# Patient Record
Sex: Male | Born: 1935 | Race: White | Hispanic: No | Marital: Married | Smoking: Never smoker
Health system: Southern US, Community
[De-identification: ages and names within clinical notes are randomized; demographics above are authoritative.]

## PROBLEM LIST (undated history)

## (undated) DIAGNOSIS — I4891 Unspecified atrial fibrillation: Secondary | ICD-10-CM

## (undated) DIAGNOSIS — K5790 Diverticulosis of intestine, part unspecified, without perforation or abscess without bleeding: Secondary | ICD-10-CM

## (undated) DIAGNOSIS — D510 Vitamin B12 deficiency anemia due to intrinsic factor deficiency: Secondary | ICD-10-CM

## (undated) DIAGNOSIS — C61 Malignant neoplasm of prostate: Secondary | ICD-10-CM

## (undated) DIAGNOSIS — Z860100 Personal history of colon polyps, unspecified: Secondary | ICD-10-CM

## (undated) DIAGNOSIS — E538 Deficiency of other specified B group vitamins: Secondary | ICD-10-CM

## (undated) DIAGNOSIS — E785 Hyperlipidemia, unspecified: Secondary | ICD-10-CM

## (undated) DIAGNOSIS — I10 Essential (primary) hypertension: Secondary | ICD-10-CM

## (undated) DIAGNOSIS — R5383 Other fatigue: Secondary | ICD-10-CM

## (undated) DIAGNOSIS — I739 Peripheral vascular disease, unspecified: Secondary | ICD-10-CM

## (undated) DIAGNOSIS — R55 Syncope and collapse: Secondary | ICD-10-CM

## (undated) HISTORY — DX: Hyperlipidemia, unspecified: E78.5

## (undated) HISTORY — DX: Essential (primary) hypertension: I10

## (undated) HISTORY — DX: Malignant neoplasm of prostate: C61

## (undated) HISTORY — DX: Unspecified atrial fibrillation: I48.91

## (undated) HISTORY — DX: Vitamin B12 deficiency anemia due to intrinsic factor deficiency: D51.0

## (undated) HISTORY — DX: Diverticulosis of intestine, part unspecified, without perforation or abscess without bleeding: K57.90

## (undated) HISTORY — DX: Syncope and collapse: R55

## (undated) HISTORY — DX: Personal history of colon polyps, unspecified: Z86.0100

## (undated) HISTORY — PX: TONSILLECTOMY: SUR1361

## (undated) HISTORY — DX: Peripheral vascular disease, unspecified: I73.9

## (undated) HISTORY — DX: Other fatigue: R53.83

## (undated) HISTORY — PX: APPENDECTOMY: SHX54

## (undated) HISTORY — DX: Personal history of colonic polyps: Z86.010

## (undated) HISTORY — DX: Deficiency of other specified B group vitamins: E53.8

---

## 2002-01-30 ENCOUNTER — Ambulatory Visit (HOSPITAL_COMMUNITY): Admission: RE | Admit: 2002-01-30 | Discharge: 2002-01-30 | Payer: Self-pay | Admitting: Gastroenterology

## 2002-01-30 ENCOUNTER — Encounter (INDEPENDENT_AMBULATORY_CARE_PROVIDER_SITE_OTHER): Payer: Self-pay

## 2007-08-24 ENCOUNTER — Encounter: Admission: RE | Admit: 2007-08-24 | Discharge: 2007-08-24 | Payer: Self-pay | Admitting: Internal Medicine

## 2008-10-19 ENCOUNTER — Ambulatory Visit: Payer: Self-pay | Admitting: Family Medicine

## 2008-10-19 DIAGNOSIS — M20019 Mallet finger of unspecified finger(s): Secondary | ICD-10-CM | POA: Insufficient documentation

## 2008-11-02 ENCOUNTER — Encounter: Payer: Self-pay | Admitting: Family Medicine

## 2010-04-17 NOTE — Assessment & Plan Note (Signed)
Summary: np w/poss mallet finger/   Vital Signs:  Patient profile:   76 year old male Height:      72 inches Weight:      215 pounds BMI:     29.26 BP sitting:   128 / 70  Vitals Entered By: Lillia Pauls CMA (October 19, 2008 8:48 AM)  History of Present Illness: [name] is seen today because of an injury to his right fourth finger.  He was working on the floor with a book on top of a desk.  This past Tuesday, his fourth finger was struck as he tried to catch a large book as it fell from the desk top.  He does not have any pain at rest. Some small amount swelling.  Cannot fully extend finger, it droopes.  PERTINENT PMH:  No prior injury or surgery to that finger.  MEDS Lovastatin SH Non smoker Not on coumadin  Physical Exam  Additional Exam:  Right hand 4th finger lacks extension at DIP. he has full extension at PIP. Normal strength in rest of hand. Small amount of slight bruising over DIP joint. minimally tender  Nail and nail bed intact.   Impression & Recommendations:  Problem # 1:  MALLET FINGER, RIGHT RING FINGER (ICD-736.1) stack splint we have in stock does not totally fit so I have given him rx for [facility] for custom molded splint. Mqallet protocol  We also gave  him the splint taht fit him best as he says he cannot go right there . discussed treatment plan in detail  He can f/u w [place] in 6 weeks or w [facility]  Appended Document: np w/poss mallet finger/    Clinical Lists Changes  Orders: Added new Service order of New Patient Level III [phone]) - Signed

## 2010-04-17 NOTE — Consult Note (Signed)
Summary: [facility]  [facility]   Imported By: Clydell Hakim 11/28/2008 14:44:39  _____________________________________________________________________  External Attachment:    Type:   Image     Comment:   External Document

## 2010-05-27 ENCOUNTER — Encounter: Payer: Self-pay | Admitting: Home Health Services

## 2010-08-01 NOTE — Op Note (Signed)
   NAME:  Logan French                      ACCOUNT NO.:  [id number]   MEDICAL RECORD NO.:  [id number]                   PATIENT TYPE:  AMB   LOCATION:  ENDO                                 FACILITY:  [facility]   PHYSICIAN:  John C. Madilyn Fireman, M.D.                 DATE OF BIRTH:  Mar 23, 1934   DATE OF PROCEDURE:  01/30/2002  DATE OF DISCHARGE:                                 OPERATIVE REPORT   PROCEDURE:  Colonoscopy with polypectomy.   INDICATIONS FOR PROCEDURE:  Screening colonoscopy in a 76 year old patient  with no prior colon screening.   DESCRIPTION OF PROCEDURE:  The patient was placed in the left lateral  decubitus position and placed on the pulse monitor with continuous low-flow  oxygen delivered by nasal cannula.  He was sedated with 50 mg IV Demerol and  6 mg IV Versed.  The Olympus video colonoscope was inserted into the rectum  and advanced to the cecum, confirmed by transillumination at McBurney's  point and visualization of the ileocecal valve and appendiceal orifice.  The  prep was excellent.  The cecum appeared normal with no masses, polyps,  diverticula, or other mucosal abnormalities.  Within the ascending colon  there was an 8 mm polyp which was fulgurated by hot biopsy.  A second 8 mm  polyp was seen in the transverse colon and also fulgurated by hot biopsy.  A  1 cm polyp was removed in the descending colon at 50 cm and was removed by  snare.  There was scattered sigmoid and descending colon diverticula.  The  rectum appeared normal, and retroflexed view of the anus revealed no obvious  internal hemorrhoids.  The colonoscope was then withdrawn, and the patient  returned to the recovery room in stable condition.  He tolerated the  procedure well, and there were no immediate complications.   IMPRESSION:  Ascending, transverse, and descending colon polyps.   PLAN:  Await histology to determine method and interval for future colon  screening.                              John C. Madilyn Fireman, M.D.    J[facility]/MEDQ  D:  01/30/2002  T:  01/30/2002  Job:  [id number]   cc:   Barry Dienes. Eloise Harman, M.D.

## 2011-10-01 DIAGNOSIS — C61 Malignant neoplasm of prostate: Secondary | ICD-10-CM | POA: Diagnosis not present

## 2011-10-08 DIAGNOSIS — C61 Malignant neoplasm of prostate: Secondary | ICD-10-CM | POA: Diagnosis not present

## 2011-10-29 DIAGNOSIS — H5210 Myopia, unspecified eye: Secondary | ICD-10-CM | POA: Diagnosis not present

## 2011-10-29 DIAGNOSIS — H259 Unspecified age-related cataract: Secondary | ICD-10-CM | POA: Diagnosis not present

## 2011-10-29 DIAGNOSIS — H43819 Vitreous degeneration, unspecified eye: Secondary | ICD-10-CM | POA: Diagnosis not present

## 2011-10-29 DIAGNOSIS — H52209 Unspecified astigmatism, unspecified eye: Secondary | ICD-10-CM | POA: Diagnosis not present

## 2011-12-08 DIAGNOSIS — H52209 Unspecified astigmatism, unspecified eye: Secondary | ICD-10-CM | POA: Diagnosis not present

## 2011-12-08 DIAGNOSIS — H269 Unspecified cataract: Secondary | ICD-10-CM | POA: Diagnosis not present

## 2011-12-08 DIAGNOSIS — H5015 Alternating exotropia: Secondary | ICD-10-CM | POA: Diagnosis not present

## 2011-12-08 DIAGNOSIS — H5210 Myopia, unspecified eye: Secondary | ICD-10-CM | POA: Diagnosis not present

## 2011-12-08 DIAGNOSIS — H25049 Posterior subcapsular polar age-related cataract, unspecified eye: Secondary | ICD-10-CM | POA: Diagnosis not present

## 2011-12-08 DIAGNOSIS — H25019 Cortical age-related cataract, unspecified eye: Secondary | ICD-10-CM | POA: Diagnosis not present

## 2011-12-08 DIAGNOSIS — H2510 Age-related nuclear cataract, unspecified eye: Secondary | ICD-10-CM | POA: Diagnosis not present

## 2012-04-25 DIAGNOSIS — H52209 Unspecified astigmatism, unspecified eye: Secondary | ICD-10-CM | POA: Diagnosis not present

## 2012-04-25 DIAGNOSIS — H259 Unspecified age-related cataract: Secondary | ICD-10-CM | POA: Diagnosis not present

## 2012-04-25 DIAGNOSIS — Z961 Presence of intraocular lens: Secondary | ICD-10-CM | POA: Diagnosis not present

## 2012-04-25 DIAGNOSIS — H5010 Unspecified exotropia: Secondary | ICD-10-CM | POA: Diagnosis not present

## 2012-05-24 DIAGNOSIS — H5210 Myopia, unspecified eye: Secondary | ICD-10-CM | POA: Diagnosis not present

## 2012-05-24 DIAGNOSIS — H5015 Alternating exotropia: Secondary | ICD-10-CM | POA: Diagnosis not present

## 2012-05-24 DIAGNOSIS — H2510 Age-related nuclear cataract, unspecified eye: Secondary | ICD-10-CM | POA: Diagnosis not present

## 2012-05-24 DIAGNOSIS — H25049 Posterior subcapsular polar age-related cataract, unspecified eye: Secondary | ICD-10-CM | POA: Diagnosis not present

## 2012-05-24 DIAGNOSIS — H25039 Anterior subcapsular polar age-related cataract, unspecified eye: Secondary | ICD-10-CM | POA: Diagnosis not present

## 2012-05-24 DIAGNOSIS — H269 Unspecified cataract: Secondary | ICD-10-CM | POA: Diagnosis not present

## 2012-05-24 DIAGNOSIS — H52209 Unspecified astigmatism, unspecified eye: Secondary | ICD-10-CM | POA: Diagnosis not present

## 2012-07-20 DIAGNOSIS — D510 Vitamin B12 deficiency anemia due to intrinsic factor deficiency: Secondary | ICD-10-CM | POA: Diagnosis not present

## 2012-07-20 DIAGNOSIS — I10 Essential (primary) hypertension: Secondary | ICD-10-CM | POA: Diagnosis not present

## 2012-07-20 DIAGNOSIS — E785 Hyperlipidemia, unspecified: Secondary | ICD-10-CM | POA: Diagnosis not present

## 2012-07-20 DIAGNOSIS — Z125 Encounter for screening for malignant neoplasm of prostate: Secondary | ICD-10-CM | POA: Diagnosis not present

## 2012-07-27 DIAGNOSIS — Z0000 Encounter for general adult medical examination without abnormal findings: Secondary | ICD-10-CM | POA: Diagnosis not present

## 2012-07-27 DIAGNOSIS — E785 Hyperlipidemia, unspecified: Secondary | ICD-10-CM | POA: Diagnosis not present

## 2012-07-27 DIAGNOSIS — Z6830 Body mass index (BMI) 30.0-30.9, adult: Secondary | ICD-10-CM | POA: Diagnosis not present

## 2012-07-27 DIAGNOSIS — D510 Vitamin B12 deficiency anemia due to intrinsic factor deficiency: Secondary | ICD-10-CM | POA: Diagnosis not present

## 2012-07-27 DIAGNOSIS — Z23 Encounter for immunization: Secondary | ICD-10-CM | POA: Diagnosis not present

## 2012-07-27 DIAGNOSIS — C61 Malignant neoplasm of prostate: Secondary | ICD-10-CM | POA: Diagnosis not present

## 2012-07-27 DIAGNOSIS — H6120 Impacted cerumen, unspecified ear: Secondary | ICD-10-CM | POA: Diagnosis not present

## 2012-07-27 DIAGNOSIS — I10 Essential (primary) hypertension: Secondary | ICD-10-CM | POA: Diagnosis not present

## 2012-08-03 DIAGNOSIS — C61 Malignant neoplasm of prostate: Secondary | ICD-10-CM | POA: Diagnosis not present

## 2012-08-15 DIAGNOSIS — N320 Bladder-neck obstruction: Secondary | ICD-10-CM | POA: Diagnosis not present

## 2012-08-15 DIAGNOSIS — C61 Malignant neoplasm of prostate: Secondary | ICD-10-CM | POA: Diagnosis not present

## 2012-09-15 DIAGNOSIS — Z6830 Body mass index (BMI) 30.0-30.9, adult: Secondary | ICD-10-CM | POA: Diagnosis not present

## 2012-09-15 DIAGNOSIS — M7660 Achilles tendinitis, unspecified leg: Secondary | ICD-10-CM | POA: Diagnosis not present

## 2012-10-24 DIAGNOSIS — M7660 Achilles tendinitis, unspecified leg: Secondary | ICD-10-CM | POA: Diagnosis not present

## 2012-10-24 DIAGNOSIS — L84 Corns and callosities: Secondary | ICD-10-CM | POA: Diagnosis not present

## 2012-10-24 DIAGNOSIS — M25569 Pain in unspecified knee: Secondary | ICD-10-CM | POA: Diagnosis not present

## 2012-10-24 DIAGNOSIS — Z6830 Body mass index (BMI) 30.0-30.9, adult: Secondary | ICD-10-CM | POA: Diagnosis not present

## 2012-11-01 DIAGNOSIS — M7660 Achilles tendinitis, unspecified leg: Secondary | ICD-10-CM | POA: Diagnosis not present

## 2012-11-01 DIAGNOSIS — Q828 Other specified congenital malformations of skin: Secondary | ICD-10-CM | POA: Diagnosis not present

## 2012-11-23 DIAGNOSIS — M1710 Unilateral primary osteoarthritis, unspecified knee: Secondary | ICD-10-CM | POA: Diagnosis not present

## 2012-11-23 DIAGNOSIS — IMO0002 Reserved for concepts with insufficient information to code with codable children: Secondary | ICD-10-CM | POA: Diagnosis not present

## 2012-11-23 DIAGNOSIS — M6520 Calcific tendinitis, unspecified site: Secondary | ICD-10-CM | POA: Diagnosis not present

## 2013-01-12 DIAGNOSIS — H5010 Unspecified exotropia: Secondary | ICD-10-CM | POA: Diagnosis not present

## 2013-01-12 DIAGNOSIS — H5210 Myopia, unspecified eye: Secondary | ICD-10-CM | POA: Diagnosis not present

## 2013-01-12 DIAGNOSIS — Z961 Presence of intraocular lens: Secondary | ICD-10-CM | POA: Diagnosis not present

## 2013-01-12 DIAGNOSIS — H52209 Unspecified astigmatism, unspecified eye: Secondary | ICD-10-CM | POA: Diagnosis not present

## 2013-02-17 DIAGNOSIS — C61 Malignant neoplasm of prostate: Secondary | ICD-10-CM | POA: Diagnosis not present

## 2013-02-24 DIAGNOSIS — C61 Malignant neoplasm of prostate: Secondary | ICD-10-CM | POA: Diagnosis not present

## 2013-02-24 DIAGNOSIS — N529 Male erectile dysfunction, unspecified: Secondary | ICD-10-CM | POA: Diagnosis not present

## 2013-08-03 DIAGNOSIS — D510 Vitamin B12 deficiency anemia due to intrinsic factor deficiency: Secondary | ICD-10-CM | POA: Diagnosis not present

## 2013-08-03 DIAGNOSIS — E785 Hyperlipidemia, unspecified: Secondary | ICD-10-CM | POA: Diagnosis not present

## 2013-08-03 DIAGNOSIS — R7309 Other abnormal glucose: Secondary | ICD-10-CM | POA: Diagnosis not present

## 2013-08-03 DIAGNOSIS — I10 Essential (primary) hypertension: Secondary | ICD-10-CM | POA: Diagnosis not present

## 2013-08-10 DIAGNOSIS — E785 Hyperlipidemia, unspecified: Secondary | ICD-10-CM | POA: Diagnosis not present

## 2013-08-10 DIAGNOSIS — H6120 Impacted cerumen, unspecified ear: Secondary | ICD-10-CM | POA: Diagnosis not present

## 2013-08-10 DIAGNOSIS — D510 Vitamin B12 deficiency anemia due to intrinsic factor deficiency: Secondary | ICD-10-CM | POA: Diagnosis not present

## 2013-08-10 DIAGNOSIS — Z79899 Other long term (current) drug therapy: Secondary | ICD-10-CM | POA: Diagnosis not present

## 2013-08-10 DIAGNOSIS — Z1331 Encounter for screening for depression: Secondary | ICD-10-CM | POA: Diagnosis not present

## 2013-08-10 DIAGNOSIS — C61 Malignant neoplasm of prostate: Secondary | ICD-10-CM | POA: Diagnosis not present

## 2013-08-10 DIAGNOSIS — R351 Nocturia: Secondary | ICD-10-CM | POA: Diagnosis not present

## 2013-08-10 DIAGNOSIS — R7309 Other abnormal glucose: Secondary | ICD-10-CM | POA: Diagnosis not present

## 2013-08-10 DIAGNOSIS — Z0000 Encounter for general adult medical examination without abnormal findings: Secondary | ICD-10-CM | POA: Diagnosis not present

## 2013-08-14 DIAGNOSIS — Z1212 Encounter for screening for malignant neoplasm of rectum: Secondary | ICD-10-CM | POA: Diagnosis not present

## 2013-08-18 DIAGNOSIS — C61 Malignant neoplasm of prostate: Secondary | ICD-10-CM | POA: Diagnosis not present

## 2013-08-25 DIAGNOSIS — R351 Nocturia: Secondary | ICD-10-CM | POA: Diagnosis not present

## 2013-08-25 DIAGNOSIS — N529 Male erectile dysfunction, unspecified: Secondary | ICD-10-CM | POA: Diagnosis not present

## 2013-08-25 DIAGNOSIS — C61 Malignant neoplasm of prostate: Secondary | ICD-10-CM | POA: Diagnosis not present

## 2013-08-30 ENCOUNTER — Other Ambulatory Visit: Payer: Self-pay | Admitting: Urology

## 2013-08-30 DIAGNOSIS — C61 Malignant neoplasm of prostate: Secondary | ICD-10-CM

## 2013-09-21 ENCOUNTER — Other Ambulatory Visit: Payer: Self-pay | Admitting: Urology

## 2013-09-21 ENCOUNTER — Other Ambulatory Visit (HOSPITAL_COMMUNITY): Payer: Medicare Other

## 2013-09-21 ENCOUNTER — Ambulatory Visit (HOSPITAL_COMMUNITY)
Admission: RE | Admit: 2013-09-21 | Discharge: 2013-09-21 | Disposition: A | Discharge status: Home / Self Care | Payer: Medicare Other | Source: Ambulatory Visit | Attending: Urology | Admitting: Urology

## 2013-09-21 DIAGNOSIS — C61 Malignant neoplasm of prostate: Secondary | ICD-10-CM

## 2013-09-21 DIAGNOSIS — R599 Enlarged lymph nodes, unspecified: Secondary | ICD-10-CM | POA: Insufficient documentation

## 2013-09-21 LAB — POCT I-STAT CREATININE: Creatinine, Ser: 1.2 mg/dL (ref 0.50–1.35)

## 2013-09-21 MED ORDER — GADOBENATE DIMEGLUMINE 529 MG/ML IV SOLN
20.0000 mL | Freq: Once | INTRAVENOUS | Status: AC | PRN
  Administered 2013-09-21: 20 mL via INTRAVENOUS

## 2013-11-18 DIAGNOSIS — S1093XA Contusion of unspecified part of neck, initial encounter: Secondary | ICD-10-CM | POA: Diagnosis not present

## 2013-11-18 DIAGNOSIS — S99919A Unspecified injury of unspecified ankle, initial encounter: Secondary | ICD-10-CM | POA: Diagnosis not present

## 2013-11-18 DIAGNOSIS — S79919A Unspecified injury of unspecified hip, initial encounter: Secondary | ICD-10-CM | POA: Diagnosis not present

## 2013-11-18 DIAGNOSIS — W1809XA Striking against other object with subsequent fall, initial encounter: Secondary | ICD-10-CM | POA: Diagnosis not present

## 2013-11-18 DIAGNOSIS — S8990XA Unspecified injury of unspecified lower leg, initial encounter: Secondary | ICD-10-CM | POA: Diagnosis not present

## 2013-11-18 DIAGNOSIS — S0003XA Contusion of scalp, initial encounter: Secondary | ICD-10-CM | POA: Diagnosis not present

## 2013-11-18 DIAGNOSIS — S7000XA Contusion of unspecified hip, initial encounter: Secondary | ICD-10-CM | POA: Diagnosis not present

## 2013-11-18 DIAGNOSIS — IMO0002 Reserved for concepts with insufficient information to code with codable children: Secondary | ICD-10-CM | POA: Diagnosis not present

## 2013-11-18 DIAGNOSIS — S8000XA Contusion of unspecified knee, initial encounter: Secondary | ICD-10-CM | POA: Diagnosis not present

## 2013-11-18 DIAGNOSIS — S0083XA Contusion of other part of head, initial encounter: Secondary | ICD-10-CM | POA: Diagnosis not present

## 2014-01-24 DIAGNOSIS — C61 Malignant neoplasm of prostate: Secondary | ICD-10-CM | POA: Diagnosis not present

## 2014-01-31 DIAGNOSIS — N5201 Erectile dysfunction due to arterial insufficiency: Secondary | ICD-10-CM | POA: Diagnosis not present

## 2014-01-31 DIAGNOSIS — C61 Malignant neoplasm of prostate: Secondary | ICD-10-CM | POA: Diagnosis not present

## 2014-03-02 DIAGNOSIS — H5010 Unspecified exotropia: Secondary | ICD-10-CM | POA: Diagnosis not present

## 2014-03-02 DIAGNOSIS — H5213 Myopia, bilateral: Secondary | ICD-10-CM | POA: Diagnosis not present

## 2014-03-02 DIAGNOSIS — H524 Presbyopia: Secondary | ICD-10-CM | POA: Diagnosis not present

## 2014-03-02 DIAGNOSIS — Z961 Presence of intraocular lens: Secondary | ICD-10-CM | POA: Diagnosis not present

## 2014-06-11 DIAGNOSIS — L0232 Furuncle of buttock: Secondary | ICD-10-CM | POA: Diagnosis not present

## 2014-06-11 DIAGNOSIS — L0231 Cutaneous abscess of buttock: Secondary | ICD-10-CM | POA: Diagnosis not present

## 2014-06-11 DIAGNOSIS — Z6831 Body mass index (BMI) 31.0-31.9, adult: Secondary | ICD-10-CM | POA: Diagnosis not present

## 2014-06-11 DIAGNOSIS — C61 Malignant neoplasm of prostate: Secondary | ICD-10-CM | POA: Diagnosis not present

## 2014-07-20 DIAGNOSIS — L720 Epidermal cyst: Secondary | ICD-10-CM | POA: Diagnosis not present

## 2014-07-20 DIAGNOSIS — L812 Freckles: Secondary | ICD-10-CM | POA: Diagnosis not present

## 2014-07-20 DIAGNOSIS — L738 Other specified follicular disorders: Secondary | ICD-10-CM | POA: Diagnosis not present

## 2014-07-20 DIAGNOSIS — D1801 Hemangioma of skin and subcutaneous tissue: Secondary | ICD-10-CM | POA: Diagnosis not present

## 2014-08-14 DIAGNOSIS — D510 Vitamin B12 deficiency anemia due to intrinsic factor deficiency: Secondary | ICD-10-CM | POA: Diagnosis not present

## 2014-08-14 DIAGNOSIS — Z125 Encounter for screening for malignant neoplasm of prostate: Secondary | ICD-10-CM | POA: Diagnosis not present

## 2014-08-14 DIAGNOSIS — E785 Hyperlipidemia, unspecified: Secondary | ICD-10-CM | POA: Diagnosis not present

## 2014-08-14 DIAGNOSIS — I10 Essential (primary) hypertension: Secondary | ICD-10-CM | POA: Diagnosis not present

## 2014-08-14 DIAGNOSIS — R7301 Impaired fasting glucose: Secondary | ICD-10-CM | POA: Diagnosis not present

## 2014-08-20 DIAGNOSIS — E785 Hyperlipidemia, unspecified: Secondary | ICD-10-CM | POA: Diagnosis not present

## 2014-08-20 DIAGNOSIS — H6121 Impacted cerumen, right ear: Secondary | ICD-10-CM | POA: Diagnosis not present

## 2014-08-20 DIAGNOSIS — Z0000 Encounter for general adult medical examination without abnormal findings: Secondary | ICD-10-CM | POA: Diagnosis not present

## 2014-08-20 DIAGNOSIS — E669 Obesity, unspecified: Secondary | ICD-10-CM | POA: Diagnosis not present

## 2014-08-20 DIAGNOSIS — I10 Essential (primary) hypertension: Secondary | ICD-10-CM | POA: Diagnosis not present

## 2014-08-20 DIAGNOSIS — Z6830 Body mass index (BMI) 30.0-30.9, adult: Secondary | ICD-10-CM | POA: Diagnosis not present

## 2014-08-20 DIAGNOSIS — R05 Cough: Secondary | ICD-10-CM | POA: Diagnosis not present

## 2014-08-20 DIAGNOSIS — R7301 Impaired fasting glucose: Secondary | ICD-10-CM | POA: Diagnosis not present

## 2014-08-20 DIAGNOSIS — Z1389 Encounter for screening for other disorder: Secondary | ICD-10-CM | POA: Diagnosis not present

## 2014-08-20 DIAGNOSIS — D510 Vitamin B12 deficiency anemia due to intrinsic factor deficiency: Secondary | ICD-10-CM | POA: Diagnosis not present

## 2014-08-20 DIAGNOSIS — C61 Malignant neoplasm of prostate: Secondary | ICD-10-CM | POA: Diagnosis not present

## 2014-08-23 DIAGNOSIS — Z1212 Encounter for screening for malignant neoplasm of rectum: Secondary | ICD-10-CM | POA: Diagnosis not present

## 2014-09-26 DIAGNOSIS — C61 Malignant neoplasm of prostate: Secondary | ICD-10-CM | POA: Diagnosis not present

## 2014-10-17 DIAGNOSIS — N5201 Erectile dysfunction due to arterial insufficiency: Secondary | ICD-10-CM | POA: Diagnosis not present

## 2014-10-17 DIAGNOSIS — N320 Bladder-neck obstruction: Secondary | ICD-10-CM | POA: Diagnosis not present

## 2014-10-17 DIAGNOSIS — C61 Malignant neoplasm of prostate: Secondary | ICD-10-CM | POA: Diagnosis not present

## 2014-10-24 DIAGNOSIS — S56911A Strain of unspecified muscles, fascia and tendons at forearm level, right arm, initial encounter: Secondary | ICD-10-CM | POA: Diagnosis not present

## 2014-10-24 DIAGNOSIS — M25521 Pain in right elbow: Secondary | ICD-10-CM | POA: Diagnosis not present

## 2014-12-18 DIAGNOSIS — Z23 Encounter for immunization: Secondary | ICD-10-CM | POA: Diagnosis not present

## 2015-03-04 DIAGNOSIS — H43813 Vitreous degeneration, bilateral: Secondary | ICD-10-CM | POA: Diagnosis not present

## 2015-03-04 DIAGNOSIS — Z961 Presence of intraocular lens: Secondary | ICD-10-CM | POA: Diagnosis not present

## 2015-03-04 DIAGNOSIS — H5010 Unspecified exotropia: Secondary | ICD-10-CM | POA: Diagnosis not present

## 2015-03-04 DIAGNOSIS — H5213 Myopia, bilateral: Secondary | ICD-10-CM | POA: Diagnosis not present

## 2015-04-24 DIAGNOSIS — L089 Local infection of the skin and subcutaneous tissue, unspecified: Secondary | ICD-10-CM | POA: Diagnosis not present

## 2015-04-24 DIAGNOSIS — L723 Sebaceous cyst: Secondary | ICD-10-CM | POA: Diagnosis not present

## 2015-08-20 DIAGNOSIS — E784 Other hyperlipidemia: Secondary | ICD-10-CM | POA: Diagnosis not present

## 2015-08-20 DIAGNOSIS — D510 Vitamin B12 deficiency anemia due to intrinsic factor deficiency: Secondary | ICD-10-CM | POA: Diagnosis not present

## 2015-08-20 DIAGNOSIS — I10 Essential (primary) hypertension: Secondary | ICD-10-CM | POA: Diagnosis not present

## 2015-08-20 DIAGNOSIS — R7301 Impaired fasting glucose: Secondary | ICD-10-CM | POA: Diagnosis not present

## 2015-08-26 DIAGNOSIS — R05 Cough: Secondary | ICD-10-CM | POA: Diagnosis not present

## 2015-08-26 DIAGNOSIS — E668 Other obesity: Secondary | ICD-10-CM | POA: Diagnosis not present

## 2015-08-26 DIAGNOSIS — C61 Malignant neoplasm of prostate: Secondary | ICD-10-CM | POA: Diagnosis not present

## 2015-08-26 DIAGNOSIS — D510 Vitamin B12 deficiency anemia due to intrinsic factor deficiency: Secondary | ICD-10-CM | POA: Diagnosis not present

## 2015-08-26 DIAGNOSIS — Z1389 Encounter for screening for other disorder: Secondary | ICD-10-CM | POA: Diagnosis not present

## 2015-08-26 DIAGNOSIS — Z6830 Body mass index (BMI) 30.0-30.9, adult: Secondary | ICD-10-CM | POA: Diagnosis not present

## 2015-08-26 DIAGNOSIS — R7301 Impaired fasting glucose: Secondary | ICD-10-CM | POA: Diagnosis not present

## 2015-08-26 DIAGNOSIS — Z0000 Encounter for general adult medical examination without abnormal findings: Secondary | ICD-10-CM | POA: Diagnosis not present

## 2015-08-26 DIAGNOSIS — I10 Essential (primary) hypertension: Secondary | ICD-10-CM | POA: Diagnosis not present

## 2015-08-26 DIAGNOSIS — E784 Other hyperlipidemia: Secondary | ICD-10-CM | POA: Diagnosis not present

## 2015-10-11 ENCOUNTER — Ambulatory Visit (INDEPENDENT_AMBULATORY_CARE_PROVIDER_SITE_OTHER): Payer: Medicare Other | Admitting: Podiatry

## 2015-10-11 ENCOUNTER — Encounter: Payer: Self-pay | Admitting: Podiatry

## 2015-10-11 VITALS — BP 134/78 | HR 70 | Resp 14

## 2015-10-11 DIAGNOSIS — M79676 Pain in unspecified toe(s): Secondary | ICD-10-CM

## 2015-10-11 DIAGNOSIS — B351 Tinea unguium: Secondary | ICD-10-CM | POA: Diagnosis not present

## 2015-10-11 DIAGNOSIS — Q828 Other specified congenital malformations of skin: Secondary | ICD-10-CM

## 2015-10-11 NOTE — Progress Notes (Signed)
   Subjective:    Patient ID: Logan French, male    DOB: 1935/02/13, 81 y.o.   MRN: [id number]  HPI this patient presents the office with chief complaint of long thick painful nails. The nails  are painful walking and wearing his shoes.  He also has painful calluses noted under the ball of both feet. He presents the office for preventative foot care services    Review of Systems  All other systems reviewed and are negative.      Objective:   Physical Exam GENERAL APPEARANCE: Alert, conversant. Appropriately groomed. No acute distress.  VASCULAR: Pedal pulses are  palpable at  [facility] and PT bilateral.  Capillary refill time is immediate to all digits,  Normal temperature gradient.  Digital hair growth is present bilateral  NEUROLOGIC: sensation is normal to 5.07 monofilament at 5/5 sites bilateral.  Light touch is intact bilateral, Muscle strength normal.  MUSCULOSKELETAL: acceptable muscle strength, tone and stability bilateral.  Intrinsic muscluature intact bilateral.  Rectus appearance of foot and digits noted bilateral.   DERMATOLOGIC: skin color, texture, and turgor are within normal limits.  No preulcerative lesions or ulcers  are seen, no interdigital maceration noted.  No open lesions present.   No drainage noted. Porokeratosis sub 1,5 B/L  NAILS  Thick disfigured discolored nails both feet x 10.       Assessment & Plan:  Onychomycosis  B/L  Porokeratosis B/L  IE  Debridement of nails.  Debridement of porokeratosis B/L  RTC 3 months   Gardiner Barefoot DPM

## 2015-10-29 ENCOUNTER — Ambulatory Visit (INDEPENDENT_AMBULATORY_CARE_PROVIDER_SITE_OTHER): Payer: Medicare Other | Admitting: Physician Assistant

## 2015-10-29 VITALS — BP 150/80 | HR 66 | Temp 98.0°F | Resp 16 | Wt 222.6 lb

## 2015-10-29 DIAGNOSIS — L738 Other specified follicular disorders: Secondary | ICD-10-CM | POA: Diagnosis not present

## 2015-10-29 DIAGNOSIS — L739 Follicular disorder, unspecified: Secondary | ICD-10-CM

## 2015-10-29 DIAGNOSIS — H6001 Abscess of right external ear: Secondary | ICD-10-CM | POA: Diagnosis not present

## 2015-10-29 MED ORDER — DOXYCYCLINE HYCLATE 100 MG PO CAPS: 100.0000 mg | ORAL_CAPSULE | Freq: Two times a day (BID) | ORAL | 0 refills | Status: DC

## 2015-10-29 NOTE — Patient Instructions (Addendum)
   Keep area dry for 24 hours. Then remove bandage to clean with soap and water in the shower. You can use antibiotic ointment with a bandaid to cover up after showers.   Take antibioitics as prescribed. If no improvement in 48 hours, return back      IF you received an x-ray today, you will receive an invoice from [facility]. Please contact [facility] at [phone] with questions or concerns regarding your invoice.   IF you received labwork today, you will receive an invoice from [employer]. Please contact [facility] at [phone] with questions or concerns regarding your invoice.   Our billing staff will not be able to assist you with questions regarding bills from these companies.  You will be contacted with the lab results as soon as they are available. The fastest way to get your results is to activate your My Chart account. Instructions are located on the last page of this paperwork. If you have not heard from [place] regarding the results in 2 weeks, please contact this office.

## 2015-10-29 NOTE — Progress Notes (Signed)
   Logan French  MRN: [id number] DOB: May 03, 1934  Subjective:  Logan French is a 81 y.o. male seen in office today for a chief complaint of cyst on right ear x 5 days.  Has associated pain, redness, warmth, and pus/blood drainage. Denies recent insect exposure, trouble hearing, and fever.   Has tried popping it himself for relief. Had pus/blood drainage but could not get it all out so he decided to come here.   Review of Systems  Per HPI  Patient Active Problem List   Diagnosis Date Noted  . MALLET FINGER, RIGHT RING FINGER 10/19/2008    Current Outpatient Prescriptions on File Prior to Visit  Medication Sig Dispense Refill  . Cyanocobalamin (VITAMIN B-12 PO) Take by mouth daily.    Marland Kitchen FINASTERIDE PO Take by mouth daily.    Marland Kitchen LOVASTATIN PO Take by mouth daily.     No current facility-administered medications on file prior to visit.     No Known Allergies  Objective:  BP (!) 150/80 (BP Location: Right Arm, Patient Position: Sitting, Cuff Size: Normal)   Pulse 66   Temp 98 F (36.7 C) (Oral)   Resp 16   Wt 222 lb 9.6 oz (101 kg)   SpO2 98%   BMI 30.19 kg/m   Physical Exam  Constitutional: He is oriented to person, place, and time and well-developed, well-nourished, and in no distress.  HENT:  Head: Normocephalic and atraumatic.  Right Ear: Tympanic membrane, external ear and ear canal normal.  Ears:  Eyes: Conjunctivae are normal.  Neck: Normal range of motion.  Pulmonary/Chest: Effort normal.  Neurological: He is alert and oriented to person, place, and time. Gait normal.  Skin: Skin is warm and dry.  Psychiatric: Affect normal.  Vitals reviewed.     BP Readings from Last 3 Encounters:  10/29/15 (!) 150/80  10/11/15 134/78  10/19/08 128/70   PROCEDURE NOTE: I&D of Abscess Verbal consent obtained. Local anesthesia with 1cc of 1% lidocaine without epinephrine. Site cleansed with Betadine.  Incision of 0.5 cm was made using a 11 blade, discharge of  minimal amount of pus and serosanguinous fluid. Wound cavity was explored with forceps. Cleansed and dressed. After care instructions provided.   Tenna Delaine, PA-C  [facility] 10/29/2015 7:24 PM     Assessment and Plan :   1. Hair follicle infection - WOUND CULTURE  2. Abscess of right earlobe - doxycycline (VIBRAMYCIN) 100 MG capsule; Take 1 capsule (100 mg total) by mouth 2 (two) times daily.  Dispense: 20 capsule; Refill: 0 -Wound care instructions given -Return in 48 hours if no improvement.    Tenna Delaine PA-C  [facility] 10/29/2015 6:37 PM

## 2015-11-02 LAB — WOUND CULTURE: Gram Stain: NONE SEEN

## 2015-11-28 DIAGNOSIS — D1801 Hemangioma of skin and subcutaneous tissue: Secondary | ICD-10-CM | POA: Diagnosis not present

## 2015-11-28 DIAGNOSIS — L821 Other seborrheic keratosis: Secondary | ICD-10-CM | POA: Diagnosis not present

## 2015-11-28 DIAGNOSIS — L820 Inflamed seborrheic keratosis: Secondary | ICD-10-CM | POA: Diagnosis not present

## 2015-11-28 DIAGNOSIS — D485 Neoplasm of uncertain behavior of skin: Secondary | ICD-10-CM | POA: Diagnosis not present

## 2015-12-11 DIAGNOSIS — Z23 Encounter for immunization: Secondary | ICD-10-CM | POA: Diagnosis not present

## 2016-02-24 DIAGNOSIS — H5010 Unspecified exotropia: Secondary | ICD-10-CM | POA: Diagnosis not present

## 2016-02-24 DIAGNOSIS — Z961 Presence of intraocular lens: Secondary | ICD-10-CM | POA: Diagnosis not present

## 2016-02-24 DIAGNOSIS — H5213 Myopia, bilateral: Secondary | ICD-10-CM | POA: Diagnosis not present

## 2016-02-24 DIAGNOSIS — H43813 Vitreous degeneration, bilateral: Secondary | ICD-10-CM | POA: Diagnosis not present

## 2016-05-01 DIAGNOSIS — Z6830 Body mass index (BMI) 30.0-30.9, adult: Secondary | ICD-10-CM | POA: Diagnosis not present

## 2016-05-01 DIAGNOSIS — J209 Acute bronchitis, unspecified: Secondary | ICD-10-CM | POA: Diagnosis not present

## 2016-05-01 DIAGNOSIS — R05 Cough: Secondary | ICD-10-CM | POA: Diagnosis not present

## 2016-05-13 DIAGNOSIS — C61 Malignant neoplasm of prostate: Secondary | ICD-10-CM | POA: Diagnosis not present

## 2016-05-20 DIAGNOSIS — R351 Nocturia: Secondary | ICD-10-CM | POA: Diagnosis not present

## 2016-05-20 DIAGNOSIS — N401 Enlarged prostate with lower urinary tract symptoms: Secondary | ICD-10-CM | POA: Diagnosis not present

## 2016-05-20 DIAGNOSIS — N5201 Erectile dysfunction due to arterial insufficiency: Secondary | ICD-10-CM | POA: Diagnosis not present

## 2016-05-20 DIAGNOSIS — C61 Malignant neoplasm of prostate: Secondary | ICD-10-CM | POA: Diagnosis not present

## 2016-07-03 DIAGNOSIS — D485 Neoplasm of uncertain behavior of skin: Secondary | ICD-10-CM | POA: Diagnosis not present

## 2016-07-03 DIAGNOSIS — L738 Other specified follicular disorders: Secondary | ICD-10-CM | POA: Diagnosis not present

## 2016-07-03 DIAGNOSIS — L821 Other seborrheic keratosis: Secondary | ICD-10-CM | POA: Diagnosis not present

## 2016-07-03 DIAGNOSIS — L723 Sebaceous cyst: Secondary | ICD-10-CM | POA: Diagnosis not present

## 2016-07-03 DIAGNOSIS — C44529 Squamous cell carcinoma of skin of other part of trunk: Secondary | ICD-10-CM | POA: Diagnosis not present

## 2016-07-03 DIAGNOSIS — L820 Inflamed seborrheic keratosis: Secondary | ICD-10-CM | POA: Diagnosis not present

## 2016-09-18 DIAGNOSIS — I10 Essential (primary) hypertension: Secondary | ICD-10-CM | POA: Diagnosis not present

## 2016-09-18 DIAGNOSIS — D510 Vitamin B12 deficiency anemia due to intrinsic factor deficiency: Secondary | ICD-10-CM | POA: Diagnosis not present

## 2016-09-18 DIAGNOSIS — Z125 Encounter for screening for malignant neoplasm of prostate: Secondary | ICD-10-CM | POA: Diagnosis not present

## 2016-09-18 DIAGNOSIS — E784 Other hyperlipidemia: Secondary | ICD-10-CM | POA: Diagnosis not present

## 2016-09-18 DIAGNOSIS — R7301 Impaired fasting glucose: Secondary | ICD-10-CM | POA: Diagnosis not present

## 2016-09-29 DIAGNOSIS — E668 Other obesity: Secondary | ICD-10-CM | POA: Diagnosis not present

## 2016-09-29 DIAGNOSIS — I10 Essential (primary) hypertension: Secondary | ICD-10-CM | POA: Diagnosis not present

## 2016-09-29 DIAGNOSIS — Z6830 Body mass index (BMI) 30.0-30.9, adult: Secondary | ICD-10-CM | POA: Diagnosis not present

## 2016-09-29 DIAGNOSIS — L508 Other urticaria: Secondary | ICD-10-CM | POA: Diagnosis not present

## 2016-09-29 DIAGNOSIS — C61 Malignant neoplasm of prostate: Secondary | ICD-10-CM | POA: Diagnosis not present

## 2016-09-29 DIAGNOSIS — R5383 Other fatigue: Secondary | ICD-10-CM | POA: Diagnosis not present

## 2016-09-29 DIAGNOSIS — M25561 Pain in right knee: Secondary | ICD-10-CM | POA: Diagnosis not present

## 2016-09-29 DIAGNOSIS — E784 Other hyperlipidemia: Secondary | ICD-10-CM | POA: Diagnosis not present

## 2016-09-29 DIAGNOSIS — R7301 Impaired fasting glucose: Secondary | ICD-10-CM | POA: Diagnosis not present

## 2016-09-29 DIAGNOSIS — Z0000 Encounter for general adult medical examination without abnormal findings: Secondary | ICD-10-CM | POA: Diagnosis not present

## 2016-09-29 DIAGNOSIS — D510 Vitamin B12 deficiency anemia due to intrinsic factor deficiency: Secondary | ICD-10-CM | POA: Diagnosis not present

## 2016-09-29 DIAGNOSIS — Z1389 Encounter for screening for other disorder: Secondary | ICD-10-CM | POA: Diagnosis not present

## 2016-11-20 DIAGNOSIS — H1131 Conjunctival hemorrhage, right eye: Secondary | ICD-10-CM | POA: Diagnosis not present

## 2016-12-04 DIAGNOSIS — L718 Other rosacea: Secondary | ICD-10-CM | POA: Diagnosis not present

## 2016-12-04 DIAGNOSIS — D1801 Hemangioma of skin and subcutaneous tissue: Secondary | ICD-10-CM | POA: Diagnosis not present

## 2016-12-04 DIAGNOSIS — Z85828 Personal history of other malignant neoplasm of skin: Secondary | ICD-10-CM | POA: Diagnosis not present

## 2016-12-04 DIAGNOSIS — L821 Other seborrheic keratosis: Secondary | ICD-10-CM | POA: Diagnosis not present

## 2016-12-04 DIAGNOSIS — L84 Corns and callosities: Secondary | ICD-10-CM | POA: Diagnosis not present

## 2016-12-11 DIAGNOSIS — Z23 Encounter for immunization: Secondary | ICD-10-CM | POA: Diagnosis not present

## 2017-02-25 DIAGNOSIS — Z961 Presence of intraocular lens: Secondary | ICD-10-CM | POA: Diagnosis not present

## 2017-02-25 DIAGNOSIS — H52203 Unspecified astigmatism, bilateral: Secondary | ICD-10-CM | POA: Diagnosis not present

## 2017-02-25 DIAGNOSIS — H43813 Vitreous degeneration, bilateral: Secondary | ICD-10-CM | POA: Diagnosis not present

## 2017-02-25 DIAGNOSIS — H26492 Other secondary cataract, left eye: Secondary | ICD-10-CM | POA: Diagnosis not present

## 2017-03-29 DIAGNOSIS — I10 Essential (primary) hypertension: Secondary | ICD-10-CM | POA: Diagnosis not present

## 2017-03-29 DIAGNOSIS — Z1389 Encounter for screening for other disorder: Secondary | ICD-10-CM | POA: Diagnosis not present

## 2017-03-29 DIAGNOSIS — Z6831 Body mass index (BMI) 31.0-31.9, adult: Secondary | ICD-10-CM | POA: Diagnosis not present

## 2017-03-29 DIAGNOSIS — R7301 Impaired fasting glucose: Secondary | ICD-10-CM | POA: Diagnosis not present

## 2017-03-29 DIAGNOSIS — R05 Cough: Secondary | ICD-10-CM | POA: Diagnosis not present

## 2017-05-18 DIAGNOSIS — C61 Malignant neoplasm of prostate: Secondary | ICD-10-CM | POA: Diagnosis not present

## 2017-05-24 DIAGNOSIS — C61 Malignant neoplasm of prostate: Secondary | ICD-10-CM | POA: Diagnosis not present

## 2017-05-24 DIAGNOSIS — R351 Nocturia: Secondary | ICD-10-CM | POA: Diagnosis not present

## 2017-05-24 DIAGNOSIS — N401 Enlarged prostate with lower urinary tract symptoms: Secondary | ICD-10-CM | POA: Diagnosis not present

## 2017-06-11 DIAGNOSIS — R42 Dizziness and giddiness: Secondary | ICD-10-CM | POA: Diagnosis not present

## 2017-06-11 DIAGNOSIS — R404 Transient alteration of awareness: Secondary | ICD-10-CM | POA: Diagnosis not present

## 2017-06-15 DIAGNOSIS — R5383 Other fatigue: Secondary | ICD-10-CM | POA: Diagnosis not present

## 2017-06-15 DIAGNOSIS — R55 Syncope and collapse: Secondary | ICD-10-CM | POA: Diagnosis not present

## 2017-06-15 DIAGNOSIS — Z6830 Body mass index (BMI) 30.0-30.9, adult: Secondary | ICD-10-CM | POA: Diagnosis not present

## 2017-06-15 DIAGNOSIS — R42 Dizziness and giddiness: Secondary | ICD-10-CM | POA: Diagnosis not present

## 2017-06-15 DIAGNOSIS — W19XXXA Unspecified fall, initial encounter: Secondary | ICD-10-CM | POA: Diagnosis not present

## 2017-06-20 DIAGNOSIS — Z79899 Other long term (current) drug therapy: Secondary | ICD-10-CM | POA: Diagnosis not present

## 2017-06-20 DIAGNOSIS — Z041 Encounter for examination and observation following transport accident: Secondary | ICD-10-CM | POA: Diagnosis not present

## 2017-06-20 DIAGNOSIS — I10 Essential (primary) hypertension: Secondary | ICD-10-CM | POA: Diagnosis not present

## 2017-06-20 DIAGNOSIS — R531 Weakness: Secondary | ICD-10-CM | POA: Diagnosis not present

## 2017-06-20 DIAGNOSIS — E785 Hyperlipidemia, unspecified: Secondary | ICD-10-CM | POA: Diagnosis not present

## 2017-06-21 DIAGNOSIS — I10 Essential (primary) hypertension: Secondary | ICD-10-CM | POA: Diagnosis not present

## 2017-06-21 DIAGNOSIS — C61 Malignant neoplasm of prostate: Secondary | ICD-10-CM | POA: Diagnosis not present

## 2017-06-21 DIAGNOSIS — R404 Transient alteration of awareness: Secondary | ICD-10-CM | POA: Diagnosis not present

## 2017-06-21 DIAGNOSIS — R55 Syncope and collapse: Secondary | ICD-10-CM | POA: Diagnosis not present

## 2017-06-21 DIAGNOSIS — Z6830 Body mass index (BMI) 30.0-30.9, adult: Secondary | ICD-10-CM | POA: Diagnosis not present

## 2017-06-23 ENCOUNTER — Encounter: Payer: Self-pay | Admitting: Neurology

## 2017-06-23 ENCOUNTER — Telehealth: Payer: Self-pay | Admitting: Neurology

## 2017-06-23 ENCOUNTER — Ambulatory Visit (INDEPENDENT_AMBULATORY_CARE_PROVIDER_SITE_OTHER): Payer: Medicare Other | Admitting: Neurology

## 2017-06-23 ENCOUNTER — Encounter (INDEPENDENT_AMBULATORY_CARE_PROVIDER_SITE_OTHER): Payer: Self-pay

## 2017-06-23 VITALS — BP 127/72 | HR 77 | Ht 72.0 in | Wt 225.0 lb

## 2017-06-23 DIAGNOSIS — R404 Transient alteration of awareness: Secondary | ICD-10-CM | POA: Diagnosis not present

## 2017-06-23 NOTE — Telephone Encounter (Signed)
Medicare/bcbs supp order sent to [facility] no auth they will contact pt to schedule.
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
## 2017-06-25 ENCOUNTER — Ambulatory Visit (HOSPITAL_COMMUNITY)
Admission: RE | Admit: 2017-06-25 | Discharge: 2017-06-25 | Disposition: A | Discharge status: Home / Self Care | Payer: Medicare Other | Source: Ambulatory Visit | Attending: Vascular Surgery | Admitting: Vascular Surgery

## 2017-06-25 ENCOUNTER — Other Ambulatory Visit (HOSPITAL_COMMUNITY): Payer: Self-pay | Admitting: Internal Medicine

## 2017-06-25 DIAGNOSIS — I10 Essential (primary) hypertension: Secondary | ICD-10-CM | POA: Insufficient documentation

## 2017-06-25 DIAGNOSIS — R55 Syncope and collapse: Secondary | ICD-10-CM | POA: Insufficient documentation

## 2017-06-25 DIAGNOSIS — R404 Transient alteration of awareness: Secondary | ICD-10-CM

## 2017-06-25 DIAGNOSIS — I6523 Occlusion and stenosis of bilateral carotid arteries: Secondary | ICD-10-CM | POA: Insufficient documentation

## 2017-06-28 ENCOUNTER — Ambulatory Visit (INDEPENDENT_AMBULATORY_CARE_PROVIDER_SITE_OTHER): Payer: Medicare Other | Admitting: Cardiovascular Disease

## 2017-06-28 ENCOUNTER — Encounter: Payer: Self-pay | Admitting: Cardiovascular Disease

## 2017-06-28 VITALS — BP 130/72 | HR 71 | Ht 72.0 in | Wt 219.4 lb

## 2017-06-28 DIAGNOSIS — I6523 Occlusion and stenosis of bilateral carotid arteries: Secondary | ICD-10-CM

## 2017-06-28 DIAGNOSIS — I10 Essential (primary) hypertension: Secondary | ICD-10-CM

## 2017-06-28 DIAGNOSIS — Z79899 Other long term (current) drug therapy: Secondary | ICD-10-CM | POA: Diagnosis not present

## 2017-06-28 DIAGNOSIS — R55 Syncope and collapse: Secondary | ICD-10-CM | POA: Diagnosis not present

## 2017-06-28 DIAGNOSIS — E785 Hyperlipidemia, unspecified: Secondary | ICD-10-CM

## 2017-06-28 NOTE — Patient Instructions (Addendum)
Medication Instructions:  Your physician recommends that you continue on your current medications as directed. Please refer to the Current Medication list given to you today.  Labwork: Please return for FASTING labs this week (CMET, CBC, Lipid, TSH, Mag)  Our in office lab hours are Monday-Friday 8:00-4:00, closed for lunch 12:45-1:45 pm.  No appointment needed.  Testing/Procedures: Your physician has requested that you have an echocardiogram. Echocardiography is a painless test that uses sound waves to create images of your heart. It provides your doctor with information about the size and shape of your heart and how well your heart's chambers and valves are working. This procedure takes approximately one hour. There are no restrictions for this procedure.  Your physician has recommended that you wear a 14 day event monitor. Event monitors are medical devices that record the heart's electrical activity. Doctors most often [place] these monitors to diagnose arrhythmias. Arrhythmias are problems with the speed or rhythm of the heartbeat. The monitor is a small, portable device. You can wear one while you do your normal daily activities. This is usually used to diagnose what is causing palpitations/syncope (passing out).  Follow-Up: 5/2 at 10:40 AM with Dr. Claiborne Billings  Any Other Special Instructions Will Be Listed Below (If Applicable).     If you need a refill on your cardiac medications before your next appointment, please call your pharmacy.
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
## 2017-06-29 ENCOUNTER — Encounter: Payer: Self-pay | Admitting: Cardiovascular Disease

## 2017-06-29 DIAGNOSIS — C61 Malignant neoplasm of prostate: Secondary | ICD-10-CM | POA: Diagnosis not present

## 2017-06-30 ENCOUNTER — Other Ambulatory Visit: Payer: Self-pay | Admitting: *Deleted

## 2017-06-30 DIAGNOSIS — R55 Syncope and collapse: Secondary | ICD-10-CM

## 2017-06-30 DIAGNOSIS — E785 Hyperlipidemia, unspecified: Secondary | ICD-10-CM | POA: Diagnosis not present

## 2017-06-30 DIAGNOSIS — I6523 Occlusion and stenosis of bilateral carotid arteries: Secondary | ICD-10-CM | POA: Diagnosis not present

## 2017-06-30 DIAGNOSIS — Z79899 Other long term (current) drug therapy: Secondary | ICD-10-CM | POA: Diagnosis not present

## 2017-06-30 DIAGNOSIS — I10 Essential (primary) hypertension: Secondary | ICD-10-CM | POA: Diagnosis not present

## 2017-07-01 ENCOUNTER — Ambulatory Visit (INDEPENDENT_AMBULATORY_CARE_PROVIDER_SITE_OTHER): Payer: Medicare Other

## 2017-07-01 DIAGNOSIS — R55 Syncope and collapse: Secondary | ICD-10-CM | POA: Diagnosis not present

## 2017-07-01 LAB — CBC
Hematocrit: 46.4 % (ref 37.5–51.0)
Hemoglobin: 14.8 g/dL (ref 13.0–17.7)
MCH: 28.4 pg (ref 26.6–33.0)
MCHC: 31.9 g/dL (ref 31.5–35.7)
MCV: 89 fL (ref 79–97)
Platelets: 211 10*3/uL (ref 150–379)
RBC: 5.21 x10E6/uL (ref 4.14–5.80)
RDW: 14.5 % (ref 12.3–15.4)
WBC: 7.8 10*3/uL (ref 3.4–10.8)

## 2017-07-01 LAB — COMPREHENSIVE METABOLIC PANEL
ALT: 25 IU/L (ref 0–44)
AST: 31 IU/L (ref 0–40)
Albumin/Globulin Ratio: 1.8 (ref 1.2–2.2)
Albumin: 4.2 g/dL (ref 3.5–4.7)
Alkaline Phosphatase: 64 IU/L (ref 39–117)
BUN/Creatinine Ratio: 15 (ref 10–24)
BUN: 16 mg/dL (ref 8–27)
Bilirubin Total: 0.8 mg/dL (ref 0.0–1.2)
CO2: 21 mmol/L (ref 20–29)
Calcium: 9.3 mg/dL (ref 8.6–10.2)
Chloride: 101 mmol/L (ref 96–106)
Creatinine, Ser: 1.08 mg/dL (ref 0.76–1.27)
GFR calc Af Amer: 75 mL/min/{1.73_m2} (ref >59)
GFR calc non Af Amer: 64 mL/min/{1.73_m2} (ref >59)
Globulin, Total: 2.4 g/dL (ref 1.5–4.5)
Glucose: 98 mg/dL (ref 65–99)
Potassium: 4.9 mmol/L (ref 3.5–5.2)
Sodium: 142 mmol/L (ref 134–144)
Total Protein: 6.6 g/dL (ref 6.0–8.5)

## 2017-07-01 LAB — MAGNESIUM: Magnesium: 1.9 mg/dL (ref 1.6–2.3)

## 2017-07-01 LAB — LIPID PANEL
Chol/HDL Ratio: 3.4 ratio (ref 0.0–5.0)
Cholesterol, Total: 192 mg/dL (ref 100–199)
HDL: 57 mg/dL (ref >39)
LDL Calculated: 104 mg/dL — ABNORMAL HIGH (ref 0–99)
Triglycerides: 155 mg/dL — ABNORMAL HIGH (ref 0–149)
VLDL Cholesterol Cal: 31 mg/dL (ref 5–40)

## 2017-07-01 LAB — TSH: TSH: 2.42 u[IU]/mL (ref 0.450–4.500)

## 2017-07-02 ENCOUNTER — Ambulatory Visit (HOSPITAL_COMMUNITY): Payer: Medicare Other | Attending: Neurology

## 2017-07-02 ENCOUNTER — Other Ambulatory Visit: Payer: Self-pay

## 2017-07-02 DIAGNOSIS — I081 Rheumatic disorders of both mitral and tricuspid valves: Secondary | ICD-10-CM | POA: Diagnosis not present

## 2017-07-02 DIAGNOSIS — Z8546 Personal history of malignant neoplasm of prostate: Secondary | ICD-10-CM | POA: Insufficient documentation

## 2017-07-02 DIAGNOSIS — I371 Nonrheumatic pulmonary valve insufficiency: Secondary | ICD-10-CM | POA: Diagnosis not present

## 2017-07-02 DIAGNOSIS — I10 Essential (primary) hypertension: Secondary | ICD-10-CM | POA: Diagnosis not present

## 2017-07-02 DIAGNOSIS — R404 Transient alteration of awareness: Secondary | ICD-10-CM | POA: Insufficient documentation

## 2017-07-02 DIAGNOSIS — E785 Hyperlipidemia, unspecified: Secondary | ICD-10-CM | POA: Insufficient documentation

## 2017-07-02 DIAGNOSIS — R55 Syncope and collapse: Secondary | ICD-10-CM | POA: Diagnosis not present

## 2017-07-05 ENCOUNTER — Ambulatory Visit
Admission: RE | Admit: 2017-07-05 | Discharge: 2017-07-05 | Disposition: A | Discharge status: Home / Self Care | Payer: Medicare Other | Source: Ambulatory Visit | Attending: Neurology | Admitting: Neurology

## 2017-07-05 DIAGNOSIS — R404 Transient alteration of awareness: Secondary | ICD-10-CM

## 2017-07-05 MED ORDER — GADOBENATE DIMEGLUMINE 529 MG/ML IV SOLN
20.0000 mL | Freq: Once | INTRAVENOUS | Status: AC | PRN
  Administered 2017-07-05: 20 mL via INTRAVENOUS

## 2017-07-09 ENCOUNTER — Other Ambulatory Visit: Payer: Self-pay | Admitting: Neurology

## 2017-07-09 DIAGNOSIS — I6521 Occlusion and stenosis of right carotid artery: Secondary | ICD-10-CM

## 2017-07-12 ENCOUNTER — Telehealth (HOSPITAL_COMMUNITY): Payer: Self-pay

## 2017-07-12 NOTE — Telephone Encounter (Signed)
Called to schedule angiogram. Pt is getting ready to go to [place] and would like to hold off until he returns. He is not sure that this is something he wants to do so we will talk further when he returns. AW

## 2017-07-15 ENCOUNTER — Ambulatory Visit (INDEPENDENT_AMBULATORY_CARE_PROVIDER_SITE_OTHER): Payer: Medicare Other | Admitting: Cardiovascular Disease

## 2017-07-15 ENCOUNTER — Encounter: Payer: Self-pay | Admitting: Cardiovascular Disease

## 2017-07-15 VITALS — BP 137/80 | HR 59 | Ht 72.0 in | Wt 222.2 lb

## 2017-07-15 DIAGNOSIS — R404 Transient alteration of awareness: Secondary | ICD-10-CM

## 2017-07-15 DIAGNOSIS — I6523 Occlusion and stenosis of bilateral carotid arteries: Secondary | ICD-10-CM

## 2017-07-15 DIAGNOSIS — I493 Ventricular premature depolarization: Secondary | ICD-10-CM | POA: Diagnosis not present

## 2017-07-15 DIAGNOSIS — I10 Essential (primary) hypertension: Secondary | ICD-10-CM

## 2017-07-15 DIAGNOSIS — E785 Hyperlipidemia, unspecified: Secondary | ICD-10-CM | POA: Diagnosis not present

## 2017-07-15 MED ORDER — ROSUVASTATIN CALCIUM 20 MG PO TABS: 20.0000 mg | ORAL_TABLET | Freq: Every day | ORAL | 3 refills | Status: DC
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
## 2017-07-15 NOTE — Patient Instructions (Signed)
Medication Instructions:  STOP lovastatin START rosuvastatin (Crestor) 20 mg daily  Labwork: At PCP in July  Follow-Up: September with Dr. Claiborne Billings  Any Other Special Instructions Will Be Listed Below (If Applicable).     If you need a refill on your cardiac medications before your next appointment, please call your pharmacy.

## 2017-07-17 ENCOUNTER — Encounter: Payer: Self-pay | Admitting: Cardiovascular Disease

## 2017-07-18 ENCOUNTER — Encounter

## 2017-08-31 ENCOUNTER — Ambulatory Visit: Payer: Medicare Other | Admitting: Neurology

## 2017-09-09 ENCOUNTER — Other Ambulatory Visit: Payer: Self-pay | Admitting: Student

## 2017-09-09 ENCOUNTER — Other Ambulatory Visit: Payer: Self-pay | Admitting: Radiology

## 2017-09-10 ENCOUNTER — Other Ambulatory Visit: Payer: Self-pay | Admitting: Neurology

## 2017-09-10 ENCOUNTER — Ambulatory Visit (HOSPITAL_COMMUNITY)
Admission: RE | Admit: 2017-09-10 | Discharge: 2017-09-10 | Disposition: A | Discharge status: Home / Self Care | Payer: Medicare Other | Source: Ambulatory Visit | Attending: Neurology | Admitting: Neurology

## 2017-09-10 ENCOUNTER — Encounter (HOSPITAL_COMMUNITY): Payer: Self-pay

## 2017-09-10 DIAGNOSIS — I6521 Occlusion and stenosis of right carotid artery: Secondary | ICD-10-CM

## 2017-09-10 DIAGNOSIS — I651 Occlusion and stenosis of basilar artery: Secondary | ICD-10-CM | POA: Diagnosis not present

## 2017-09-10 DIAGNOSIS — I10 Essential (primary) hypertension: Secondary | ICD-10-CM | POA: Insufficient documentation

## 2017-09-10 DIAGNOSIS — D510 Vitamin B12 deficiency anemia due to intrinsic factor deficiency: Secondary | ICD-10-CM | POA: Diagnosis not present

## 2017-09-10 DIAGNOSIS — Z7982 Long term (current) use of aspirin: Secondary | ICD-10-CM | POA: Insufficient documentation

## 2017-09-10 DIAGNOSIS — E785 Hyperlipidemia, unspecified: Secondary | ICD-10-CM | POA: Diagnosis not present

## 2017-09-10 DIAGNOSIS — I6502 Occlusion and stenosis of left vertebral artery: Secondary | ICD-10-CM | POA: Diagnosis not present

## 2017-09-10 DIAGNOSIS — Z9181 History of falling: Secondary | ICD-10-CM | POA: Diagnosis not present

## 2017-09-10 DIAGNOSIS — I6529 Occlusion and stenosis of unspecified carotid artery: Secondary | ICD-10-CM | POA: Diagnosis not present

## 2017-09-10 HISTORY — PX: IR ANGIO VERTEBRAL SEL SUBCLAVIAN INNOMINATE BILAT MOD SED: IMG5366

## 2017-09-10 HISTORY — PX: IR ANGIO INTRA EXTRACRAN SEL COM CAROTID INNOMINATE BILAT MOD SED: IMG5360

## 2017-09-10 LAB — APTT: aPTT: 28 seconds (ref 24–36)

## 2017-09-10 LAB — PROTIME-INR
INR: 1.07
Prothrombin Time: 13.8 seconds (ref 11.4–15.2)

## 2017-09-10 LAB — CBC
HCT: 42.2 % (ref 39.0–52.0)
Hemoglobin: 13.7 g/dL (ref 13.0–17.0)
MCH: 28.4 pg (ref 26.0–34.0)
MCHC: 32.5 g/dL (ref 30.0–36.0)
MCV: 87.4 fL (ref 78.0–100.0)
Platelets: 202 10*3/uL (ref 150–400)
RBC: 4.83 MIL/uL (ref 4.22–5.81)
RDW: 13.6 % (ref 11.5–15.5)
WBC: 7.9 10*3/uL (ref 4.0–10.5)

## 2017-09-10 LAB — BASIC METABOLIC PANEL
Anion gap: 8 (ref 5–15)
BUN: 17 mg/dL (ref 8–23)
CO2: 26 mmol/L (ref 22–32)
Calcium: 8.9 mg/dL (ref 8.9–10.3)
Chloride: 108 mmol/L (ref 98–111)
Creatinine, Ser: 1.08 mg/dL (ref 0.61–1.24)
GFR calc Af Amer: >60 mL/min (ref >60)
GFR calc non Af Amer: >60 mL/min (ref >60)
Glucose, Bld: 99 mg/dL (ref 70–99)
Potassium: 4.1 mmol/L (ref 3.5–5.1)
Sodium: 142 mmol/L (ref 135–145)

## 2017-09-10 MED ORDER — HEPARIN SODIUM (PORCINE) 1000 UNIT/ML IJ SOLN
INTRAMUSCULAR | Status: AC
  Filled 2017-09-10: qty 1

## 2017-09-10 MED ORDER — MIDAZOLAM HCL 2 MG/2ML IJ SOLN
INTRAMUSCULAR | Status: AC
  Filled 2017-09-10: qty 2

## 2017-09-10 MED ORDER — MIDAZOLAM HCL 2 MG/2ML IJ SOLN
INTRAMUSCULAR | Status: AC | PRN
  Administered 2017-09-10: 1 mg via INTRAVENOUS

## 2017-09-10 MED ORDER — LIDOCAINE HCL 1 % IJ SOLN
INTRAMUSCULAR | Status: AC
  Filled 2017-09-10: qty 20

## 2017-09-10 MED ORDER — HEPARIN SODIUM (PORCINE) 1000 UNIT/ML IJ SOLN
INTRAMUSCULAR | Status: AC | PRN
  Administered 2017-09-10: 1000 [IU] via INTRAVENOUS

## 2017-09-10 MED ORDER — FENTANYL CITRATE (PF) 100 MCG/2ML IJ SOLN
INTRAMUSCULAR | Status: AC | PRN
  Administered 2017-09-10: 25 ug via INTRAVENOUS

## 2017-09-10 MED ORDER — IOHEXOL 300 MG/ML  SOLN: 96.0000 mL | Freq: Once | INTRAMUSCULAR | Status: DC | PRN

## 2017-09-10 MED ORDER — SODIUM CHLORIDE 0.9 % IV SOLN
INTRAVENOUS | Status: DC
  Administered 2017-09-10: 09:00:00 via INTRAVENOUS

## 2017-09-10 MED ORDER — FENTANYL CITRATE (PF) 100 MCG/2ML IJ SOLN
INTRAMUSCULAR | Status: AC
  Filled 2017-09-10: qty 2

## 2017-09-10 MED ORDER — IOPAMIDOL (ISOVUE-300) INJECTION 61%
INTRAVENOUS | Status: AC
  Filled 2017-09-10: qty 50

## 2017-09-10 MED ORDER — SODIUM CHLORIDE 0.9 % IV SOLN: INTRAVENOUS | Status: AC

## 2017-09-10 MED ORDER — NITROGLYCERIN 1 MG/10 ML FOR IR/CATH LAB
INTRA_ARTERIAL | Status: AC
  Filled 2017-09-10: qty 10

## 2017-09-10 MED ORDER — LIDOCAINE HCL (PF) 1 % IJ SOLN
INTRAMUSCULAR | Status: AC | PRN
  Administered 2017-09-10: 10 mL
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
## 2017-09-10 NOTE — Procedures (Signed)
S/P 4 vessel cerebral arteriogram RT CFA approach. Findings. 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

## 2017-09-10 NOTE — Sedation Documentation (Signed)
Pressure dressing to right groin clean, dry and intact

## 2017-09-10 NOTE — Discharge Instructions (Signed)

## 2017-09-13 ENCOUNTER — Telehealth: Payer: Self-pay | Admitting: Neurology

## 2017-09-13 ENCOUNTER — Encounter (HOSPITAL_COMMUNITY): Payer: Self-pay | Admitting: Interventional Radiology

## 2017-09-13 NOTE — Telephone Encounter (Signed)
Tried to call patient. Rn call twice and phone had busy signal. Pts angiogram was done by Dr. Estanislado Pandy today.

## 2017-09-13 NOTE — Telephone Encounter (Signed)
Pt said he was shown the pictures of the angiogram by Dr Estanislado Pandy and knows there is a problem with an artery. Pt is aware Dr Leonie Man is out of the office until 7/15. Please call to advise when you return. Pt has appt on 8/13 but is wanting to discuss prior.

## 2017-09-14 ENCOUNTER — Ambulatory Visit: Payer: Medicare Other | Admitting: Neurology

## 2017-09-17 ENCOUNTER — Encounter (HOSPITAL_COMMUNITY): Payer: Self-pay

## 2017-09-17 ENCOUNTER — Other Ambulatory Visit: Payer: Self-pay | Admitting: Neurology

## 2017-09-17 DIAGNOSIS — I6521 Occlusion and stenosis of right carotid artery: Secondary | ICD-10-CM

## 2017-09-20 NOTE — Telephone Encounter (Signed)
I tried calling patient's listed number but the line was busy.

## 2017-09-21 ENCOUNTER — Telehealth: Payer: Self-pay

## 2017-09-21 NOTE — Telephone Encounter (Signed)
Per Dr. Clydene Fake result note for the cerebral catheter angiogram: "I spoke to the patient and communicated results of the diagnostic cerebral catheter angiogram suggesting high-grade severe stenosis of the petrous right internal carotid artery with limited flow beyond it. This does place the patient at risk for possible strokes related to this but I am not sure whether this is explanation for his episode of confusion that he had. Patient will speak to Dr. Estanislado Pandy and better understand the risk-benefit prior to considering elective angioplasty stenting. He was advised to continue his medications and keep scheduled follow-up appointment with me next month. He voiced understanding."

## 2017-09-21 NOTE — Telephone Encounter (Signed)
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 This does place the patient at risk for possible strokes related to this but I am not sure whether this is explanation for his episode of confusion that he had. Patient will speak to Dr. Estanislado Pandy and better understand the risk-benefit prior to considering elective angioplasty stenting. He was advised to continue his medications and keep scheduled follow-up appointment with me next month. He voiced understanding

## 2017-09-21 NOTE — Telephone Encounter (Signed)
Dr. Leonie Man discussed results with pt.

## 2017-10-05 DIAGNOSIS — I10 Essential (primary) hypertension: Secondary | ICD-10-CM | POA: Diagnosis not present

## 2017-10-05 DIAGNOSIS — R7301 Impaired fasting glucose: Secondary | ICD-10-CM | POA: Diagnosis not present

## 2017-10-05 DIAGNOSIS — R82998 Other abnormal findings in urine: Secondary | ICD-10-CM | POA: Diagnosis not present

## 2017-10-05 DIAGNOSIS — E7849 Other hyperlipidemia: Secondary | ICD-10-CM | POA: Diagnosis not present

## 2017-10-05 DIAGNOSIS — D510 Vitamin B12 deficiency anemia due to intrinsic factor deficiency: Secondary | ICD-10-CM | POA: Diagnosis not present

## 2017-10-06 ENCOUNTER — Telehealth: Payer: Self-pay | Admitting: Cardiovascular Disease

## 2017-10-06 NOTE — Telephone Encounter (Signed)
New message   Patient calling to request a conversation with Dr Claiborne Billings regarding angiogram.

## 2017-10-06 NOTE — Telephone Encounter (Signed)
Spoke with pt who states he had an angiogram ordered by his Neurologist Dr. Leonie Man and would like Dr. Claiborne Billings to review results as well as his input/recommendation. Routing to DM

## 2017-10-12 DIAGNOSIS — Z0000 Encounter for general adult medical examination without abnormal findings: Secondary | ICD-10-CM | POA: Diagnosis not present

## 2017-10-12 DIAGNOSIS — D510 Vitamin B12 deficiency anemia due to intrinsic factor deficiency: Secondary | ICD-10-CM | POA: Diagnosis not present

## 2017-10-12 DIAGNOSIS — I6789 Other cerebrovascular disease: Secondary | ICD-10-CM | POA: Diagnosis not present

## 2017-10-12 DIAGNOSIS — R42 Dizziness and giddiness: Secondary | ICD-10-CM | POA: Diagnosis not present

## 2017-10-12 DIAGNOSIS — I10 Essential (primary) hypertension: Secondary | ICD-10-CM | POA: Diagnosis not present

## 2017-10-12 DIAGNOSIS — R7301 Impaired fasting glucose: Secondary | ICD-10-CM | POA: Diagnosis not present

## 2017-10-12 DIAGNOSIS — E7849 Other hyperlipidemia: Secondary | ICD-10-CM | POA: Diagnosis not present

## 2017-10-12 DIAGNOSIS — C61 Malignant neoplasm of prostate: Secondary | ICD-10-CM | POA: Diagnosis not present

## 2017-10-12 DIAGNOSIS — Z1389 Encounter for screening for other disorder: Secondary | ICD-10-CM | POA: Diagnosis not present

## 2017-10-12 DIAGNOSIS — H02409 Unspecified ptosis of unspecified eyelid: Secondary | ICD-10-CM | POA: Diagnosis not present

## 2017-10-12 DIAGNOSIS — Z6830 Body mass index (BMI) 30.0-30.9, adult: Secondary | ICD-10-CM | POA: Diagnosis not present

## 2017-10-18 NOTE — Telephone Encounter (Signed)
Approximately 95% plus pre occlusive stenosis of the horizontal petrous segment of the right internal carotid artery. Angiographically occluded left vertebral artery at its origin, and also at the level of C1.  Agree with plans to discuss further with Dr. Leonie Man at office visit next week regarding potential prevention.

## 2017-10-19 DIAGNOSIS — Z1212 Encounter for screening for malignant neoplasm of rectum: Secondary | ICD-10-CM | POA: Diagnosis not present

## 2017-10-19 NOTE — Telephone Encounter (Signed)
Spoke with pt and advised of Dr. Evette Georges interpretation and recommendation. Pt verbalized understanding.

## 2017-10-26 ENCOUNTER — Ambulatory Visit (INDEPENDENT_AMBULATORY_CARE_PROVIDER_SITE_OTHER): Payer: Medicare Other | Admitting: Neurology

## 2017-10-26 ENCOUNTER — Encounter: Payer: Self-pay | Admitting: Neurology

## 2017-10-26 VITALS — BP 123/79 | HR 60 | Ht 72.0 in | Wt 215.4 lb

## 2017-10-26 DIAGNOSIS — I6523 Occlusion and stenosis of bilateral carotid arteries: Secondary | ICD-10-CM

## 2017-10-26 DIAGNOSIS — I6521 Occlusion and stenosis of right carotid artery: Secondary | ICD-10-CM | POA: Diagnosis not present

## 2017-10-26 NOTE — Patient Instructions (Signed)
I had a long discussion with the patient regarding his prior episode of transient altered consciousness and findings on cerebral catheter angiogram and MRA suggesting high-grade right skull base carotid stenosis. We discussed the risk-benefit of carotid angioplasty stenting and need for aggressive risk factor modification. The patient is reluctant to consider angioplasty stenting at the present time but is willing to reconsider if he has future symptoms of right hemispheric ischemia. I recommend he stay on aspirin for stroke prevention and maintain strict control of hypertension but blood pressure goal below 130/90, lipids with LDL cholesterol goal below 70 mg percent and eat a healthy diet with lots of fruits, vegetables, whole gr and cereals. He was also advised to keep himself well-hydrated and be physically active. He will return for follow-up in 6 months or call earlier if necessary.  Carotid Angioplasty With Stent Carotid angioplasty is a procedure to open or widen an artery in the neck (carotid artery) that is blocked or has become narrow. This is done by using a small piece of metal that looks like a coil or spring (stent). The stent helps keep the artery open by supporting the artery walls. The carotid arteries supply blood to the brain. When fats, cholesterol, and other materials (plaque) build up in an artery, the artery becomes narrow and can become blocked. This can reduce or block blood flow to certain areas of the brain, which can cause serious health problems, including stroke. Tell a health care provider about:  Any allergies you have.  All medicines you are taking, including vitamins, herbs, eye drops, creams, and over-the-counter medicines.  Any problems you or family members have had with anesthetic medicines.  Any blood disorders you have.  Any surgeries you have had.  Any medical conditions you have.  Whether you are pregnant or may be pregnant. What are the  risks? Generally, this is a safe procedure. However, problems may occur, including:  Infection.  Bleeding.  Allergic reactions to medicines or dyes.  Damage to other structures or organs, or the carotid artery itself.  The carotid artery becoming blocked again.  A collection of blood under the skin (hematoma) around the stent site that gets larger (expands).  Blood clot in another part of the body.  Kidney injury.  What happens before the procedure?  Ask your health care provider about: ? Changing or stopping your regular medicines. This is especially important if you are taking diabetes medicines or blood thinners. ? Taking medicines such as aspirin and ibuprofen. These medicines can thin your blood. Do not take these medicines before your procedure if your health care provider instructs you not to.  Follow instructions from your health care provider about eating or drinking restrictions.  Do not use any tobacco products for at least 24 hours before your procedure. This includes cigarettes, chewing tobacco, or e-cigarettes.  Ask your health care provider how your surgical site will be marked or identified.  You may be given antibiotic medicine to help prevent infection.  You may have blood tests done.  Plan to have someone take you home after the procedure.  If you will be going home right after the procedure, plan to have someone with you for 24 hours. What happens during the procedure?  To reduce your risk of infection: ? Your health care team will wash or sanitize their hands. ? Your skin will be washed with soap.  An IV tube will be inserted into one of your veins.  You will be given one  or more of the following: ? A medicine to help you relax (sedative). ? A medicine to make you fall asleep (general anesthetic).  An cut (incision) will be made. Most commonly, an incision will be made in your groin. In some cases, an incision may be made in your wrist or forearm  instead of your groin.  A small, flexible tube (catheter) will be inserted through your incision, into an artery. The catheter will be threaded upward into your carotid artery. An X-ray machine (fluoroscope) will help your health care provider guide the catheter to the correct place in your artery.  Dye will be injected into the catheter and will travel to the narrow or blocked part of your carotid artery.  X-ray images will be taken of how the dye flows through your artery. While images are being taken, you may be given instructions about breathing, swallowing, moving, or talking.  A filter (distal protection device) will be inserted into your artery. This will be used to catch plaque that comes loose in your artery during the procedure. This prevents plaque from moving into your brain.  A small balloon will be inserted into your artery. The balloon will be inflated for a few seconds to widen your artery and then removed.  The stent will be placed in your artery.  A second small balloon will be inserted into your artery and inflated. This expands the stent inside of your artery, so that the stent holds up the artery walls. The balloon will then be removed.  The catheter and the distal protection device will be removed from your artery.  Your incision may be closed with stitches (sutures), skin glue, or adhesive tape.  A bandage (dressing) will be placed over your incision. The procedure may vary among health care providers and hospitals. What happens after the procedure?  Your blood pressure, heart rate, breathing rate, and blood oxygen level will be monitored often until the medicines you were given have worn off.  You may continue to receive fluids and medicines through an IV tube.  You may have some pain. Pain medicines will be available to help you.  You may have X-rays to make sure that the stent is in the correct place.  You may have to wear compression stockings. These  stockings help to prevent blood clots and reduce swelling in your legs.  Do not drive for 24 hours if you received a sedative. This information is not intended to replace advice given to you by your health care provider. Make sure you discuss any questions you have with your health care provider. Document Released: 07/14/2004 Document Revised: 08/08/2015 Document Reviewed: 11/25/2014 Elsevier Interactive Patient Education  [employer].
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
## 2017-12-01 ENCOUNTER — Encounter: Payer: Self-pay | Admitting: Cardiovascular Disease

## 2017-12-01 ENCOUNTER — Ambulatory Visit (INDEPENDENT_AMBULATORY_CARE_PROVIDER_SITE_OTHER): Payer: Medicare Other | Admitting: Cardiovascular Disease

## 2017-12-01 VITALS — BP 130/60 | HR 61 | Ht 72.0 in | Wt 217.8 lb

## 2017-12-01 DIAGNOSIS — I10 Essential (primary) hypertension: Secondary | ICD-10-CM

## 2017-12-01 DIAGNOSIS — Z79899 Other long term (current) drug therapy: Secondary | ICD-10-CM | POA: Diagnosis not present

## 2017-12-01 DIAGNOSIS — I6502 Occlusion and stenosis of left vertebral artery: Secondary | ICD-10-CM

## 2017-12-01 DIAGNOSIS — I491 Atrial premature depolarization: Secondary | ICD-10-CM | POA: Diagnosis not present

## 2017-12-01 DIAGNOSIS — E785 Hyperlipidemia, unspecified: Secondary | ICD-10-CM | POA: Diagnosis not present

## 2017-12-01 DIAGNOSIS — I6523 Occlusion and stenosis of bilateral carotid arteries: Secondary | ICD-10-CM | POA: Diagnosis not present

## 2017-12-01 DIAGNOSIS — I6521 Occlusion and stenosis of right carotid artery: Secondary | ICD-10-CM

## 2017-12-01 DIAGNOSIS — I493 Ventricular premature depolarization: Secondary | ICD-10-CM | POA: Diagnosis not present

## 2017-12-01 NOTE — Patient Instructions (Signed)
Medication Instructions:  Your physician recommends that you continue on your current medications as directed. Please refer to the Current Medication list given to you today.  Labwork: Please return for FASTING labs next week (Lipid, CMET)  Our in office lab hours are Monday-Friday 8:00-4:00, closed for lunch 12:45-1:45 pm.  No appointment needed.  Follow-Up: Your physician wants you to follow-up in: April with Dr. Claiborne Billings.  You will receive a reminder letter in the mail two months in advance. If you don't receive a letter, please call our office to schedule the follow-up appointment.   Any Other Special Instructions Will Be Listed Below (If Applicable).     If you need a refill on your cardiac medications before your next appointment, please call your pharmacy.
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
## 2017-12-04 ENCOUNTER — Encounter: Payer: Self-pay | Admitting: Cardiovascular Disease

## 2017-12-08 DIAGNOSIS — Z79899 Other long term (current) drug therapy: Secondary | ICD-10-CM | POA: Diagnosis not present

## 2017-12-08 DIAGNOSIS — E785 Hyperlipidemia, unspecified: Secondary | ICD-10-CM | POA: Diagnosis not present

## 2017-12-08 LAB — LIPID PANEL
Chol/HDL Ratio: 2.2 ratio (ref 0.0–5.0)
Cholesterol, Total: 115 mg/dL (ref 100–199)
HDL: 53 mg/dL (ref >39)
LDL Calculated: 42 mg/dL (ref 0–99)
Triglycerides: 98 mg/dL (ref 0–149)
VLDL Cholesterol Cal: 20 mg/dL (ref 5–40)

## 2017-12-08 LAB — COMPREHENSIVE METABOLIC PANEL
ALT: 27 IU/L (ref 0–44)
AST: 27 IU/L (ref 0–40)
Albumin/Globulin Ratio: 2.3 — ABNORMAL HIGH (ref 1.2–2.2)
Albumin: 4.3 g/dL (ref 3.5–4.7)
Alkaline Phosphatase: 53 IU/L (ref 39–117)
BUN/Creatinine Ratio: 15 (ref 10–24)
BUN: 16 mg/dL (ref 8–27)
Bilirubin Total: 0.9 mg/dL (ref 0.0–1.2)
CO2: 24 mmol/L (ref 20–29)
Calcium: 9.2 mg/dL (ref 8.6–10.2)
Chloride: 103 mmol/L (ref 96–106)
Creatinine, Ser: 1.04 mg/dL (ref 0.76–1.27)
GFR calc Af Amer: 77 mL/min/{1.73_m2} (ref >59)
GFR calc non Af Amer: 67 mL/min/{1.73_m2} (ref >59)
Globulin, Total: 1.9 g/dL (ref 1.5–4.5)
Glucose: 105 mg/dL — ABNORMAL HIGH (ref 65–99)
Potassium: 4.7 mmol/L (ref 3.5–5.2)
Sodium: 142 mmol/L (ref 134–144)
Total Protein: 6.2 g/dL (ref 6.0–8.5)

## 2017-12-20 DIAGNOSIS — C61 Malignant neoplasm of prostate: Secondary | ICD-10-CM | POA: Diagnosis not present

## 2017-12-29 DIAGNOSIS — N401 Enlarged prostate with lower urinary tract symptoms: Secondary | ICD-10-CM | POA: Diagnosis not present

## 2017-12-29 DIAGNOSIS — C61 Malignant neoplasm of prostate: Secondary | ICD-10-CM | POA: Diagnosis not present

## 2017-12-29 DIAGNOSIS — R351 Nocturia: Secondary | ICD-10-CM | POA: Diagnosis not present

## 2018-02-01 DIAGNOSIS — L218 Other seborrheic dermatitis: Secondary | ICD-10-CM | POA: Diagnosis not present

## 2018-02-01 DIAGNOSIS — Z85828 Personal history of other malignant neoplasm of skin: Secondary | ICD-10-CM | POA: Diagnosis not present

## 2018-02-01 DIAGNOSIS — L820 Inflamed seborrheic keratosis: Secondary | ICD-10-CM | POA: Diagnosis not present

## 2018-02-01 DIAGNOSIS — L821 Other seborrheic keratosis: Secondary | ICD-10-CM | POA: Diagnosis not present

## 2018-02-01 DIAGNOSIS — I788 Other diseases of capillaries: Secondary | ICD-10-CM | POA: Diagnosis not present

## 2018-02-01 DIAGNOSIS — L812 Freckles: Secondary | ICD-10-CM | POA: Diagnosis not present

## 2018-02-01 DIAGNOSIS — D1801 Hemangioma of skin and subcutaneous tissue: Secondary | ICD-10-CM | POA: Diagnosis not present

## 2018-03-01 DIAGNOSIS — Z23 Encounter for immunization: Secondary | ICD-10-CM | POA: Diagnosis not present

## 2018-03-02 DIAGNOSIS — H43813 Vitreous degeneration, bilateral: Secondary | ICD-10-CM | POA: Diagnosis not present

## 2018-03-02 DIAGNOSIS — H5010 Unspecified exotropia: Secondary | ICD-10-CM | POA: Diagnosis not present

## 2018-03-02 DIAGNOSIS — H5213 Myopia, bilateral: Secondary | ICD-10-CM | POA: Diagnosis not present

## 2018-03-02 DIAGNOSIS — Z961 Presence of intraocular lens: Secondary | ICD-10-CM | POA: Diagnosis not present

## 2018-04-28 ENCOUNTER — Encounter: Payer: Self-pay | Admitting: Neurology

## 2018-04-28 ENCOUNTER — Ambulatory Visit (INDEPENDENT_AMBULATORY_CARE_PROVIDER_SITE_OTHER): Payer: Medicare Other | Admitting: Neurology

## 2018-04-28 VITALS — BP 115/76 | HR 59 | Ht 72.0 in | Wt 225.0 lb

## 2018-04-28 DIAGNOSIS — I6521 Occlusion and stenosis of right carotid artery: Secondary | ICD-10-CM
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
## 2018-04-28 NOTE — Patient Instructions (Signed)
I had a long discussion with the patient with regards to his asymptomatic terminal right ICA stenosis and discussed risks for recurrent strokes and stroke prevention strategies.  He is doing well without neurovascular symptoms.  I recommend he continue aspirin for stroke prevention and maintain strict control of hypertension with blood pressure goal below 130/90, lipids with LDL cholesterol goal below 70 mg percent, diabetes with hemoglobin A1c goal below 6.5%.  I encouraged him to continue to eat a healthy diet with lots of fruits, vegetables, cereals and whole grains and to be active and exercise regularly.  I also advised him to drink plenty of fluids and not to get dehydrated and also to avoid hypotension.  He will return for follow-up in the future in a year or call earlier if necessary

## 2018-06-21 ENCOUNTER — Telehealth: Payer: Self-pay

## 2018-06-21 NOTE — Telephone Encounter (Signed)
Spoke with pt to set up virtual visit, pt stated he was doing fine and would like cancel his up coming visit and cancel for a later date in Aug. or Sept.

## 2018-06-29 ENCOUNTER — Ambulatory Visit: Payer: Medicare Other | Admitting: Cardiovascular Disease

## 2018-08-18 ENCOUNTER — Other Ambulatory Visit: Payer: Self-pay | Admitting: Cardiovascular Disease

## 2018-11-02 DIAGNOSIS — C61 Malignant neoplasm of prostate: Secondary | ICD-10-CM | POA: Diagnosis not present

## 2018-11-09 DIAGNOSIS — N401 Enlarged prostate with lower urinary tract symptoms: Secondary | ICD-10-CM | POA: Diagnosis not present

## 2018-11-09 DIAGNOSIS — R351 Nocturia: Secondary | ICD-10-CM | POA: Diagnosis not present

## 2018-11-09 DIAGNOSIS — C61 Malignant neoplasm of prostate: Secondary | ICD-10-CM | POA: Diagnosis not present

## 2018-11-10 ENCOUNTER — Ambulatory Visit (INDEPENDENT_AMBULATORY_CARE_PROVIDER_SITE_OTHER): Payer: Medicare Other | Admitting: Cardiovascular Disease

## 2018-11-10 ENCOUNTER — Encounter: Payer: Self-pay | Admitting: Cardiovascular Disease

## 2018-11-10 ENCOUNTER — Other Ambulatory Visit: Payer: Self-pay

## 2018-11-10 VITALS — BP 141/82 | HR 61 | Ht 72.0 in | Wt 219.0 lb

## 2018-11-10 DIAGNOSIS — E785 Hyperlipidemia, unspecified: Secondary | ICD-10-CM

## 2018-11-10 DIAGNOSIS — M25473 Effusion, unspecified ankle: Secondary | ICD-10-CM | POA: Diagnosis not present

## 2018-11-10 DIAGNOSIS — I10 Essential (primary) hypertension: Secondary | ICD-10-CM | POA: Diagnosis not present

## 2018-11-10 DIAGNOSIS — I6502 Occlusion and stenosis of left vertebral artery: Secondary | ICD-10-CM | POA: Diagnosis not present

## 2018-11-10 DIAGNOSIS — I6523 Occlusion and stenosis of bilateral carotid arteries: Secondary | ICD-10-CM

## 2018-11-10 DIAGNOSIS — Z79899 Other long term (current) drug therapy: Secondary | ICD-10-CM

## 2018-11-10 MED ORDER — HYDROCHLOROTHIAZIDE 12.5 MG PO CAPS: 12.5000 mg | ORAL_CAPSULE | Freq: Every day | ORAL | 3 refills | Status: DC

## 2018-11-10 NOTE — Patient Instructions (Signed)
Medication Instructions:  START HYDROCHLOROTHIAZIDE 12.5MG  DAILY If you need a refill on your cardiac medications before your next appointment, please call your pharmacy.  Labwork: FASTING LIPID PANEL AND CMET HERE IN OUR OFFICE AT [facility]   You will need to fast. DO NOT EAT OR DRINK PAST MIDNIGHT.       Take the provided lab slips with you to the lab for your blood draw.   When you have your labs (blood work) drawn today and your tests are completely normal, you will receive your results only by MyChart Message (if you have MyChart) -OR-  A paper copy in the mail.  If you have any lab test that is abnormal or we need to change your treatment, we will call you to review these results.  Special Instructions: STOP TAKING IBUPROFEN TRY TAKING TYLENOL  Follow-Up: You will need a follow up appointment in 6 months.  Please call our office 3 months in advance, November 2020 to schedule this appointment.  You may see [name], MD or one of the following Advanced Practice Providers on your designated Care Team: [place], [place] . Fabian Sharp, PA-C     At [facility], you and your health needs are our priority.  As part of our continuing mission to provide you with exceptional heart care, we have created designated Provider Care Teams.  These Care Teams include your primary Cardiologist (physician) and Advanced Practice Providers (APPs -  Physician Assistants and Nurse Practitioners) who all work together to provide you with the care you need, when you need it.  Thank you for choosing [facility] at [facility]!!
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
## 2018-11-12 ENCOUNTER — Encounter: Payer: Self-pay | Admitting: Cardiovascular Disease

## 2018-11-16 ENCOUNTER — Other Ambulatory Visit: Payer: Self-pay | Admitting: Cardiovascular Disease

## 2018-11-17 DIAGNOSIS — I10 Essential (primary) hypertension: Secondary | ICD-10-CM | POA: Diagnosis not present

## 2018-11-17 DIAGNOSIS — Z79899 Other long term (current) drug therapy: Secondary | ICD-10-CM | POA: Diagnosis not present

## 2018-11-17 LAB — COMPREHENSIVE METABOLIC PANEL
ALT: 27 IU/L (ref 0–44)
AST: 30 IU/L (ref 0–40)
Albumin/Globulin Ratio: 1.9 (ref 1.2–2.2)
Albumin: 4.2 g/dL (ref 3.6–4.6)
Alkaline Phosphatase: 54 IU/L (ref 39–117)
BUN/Creatinine Ratio: 21 (ref 10–24)
BUN: 23 mg/dL (ref 8–27)
Bilirubin Total: 0.8 mg/dL (ref 0.0–1.2)
CO2: 24 mmol/L (ref 20–29)
Calcium: 9.3 mg/dL (ref 8.6–10.2)
Chloride: 101 mmol/L (ref 96–106)
Creatinine, Ser: 1.08 mg/dL (ref 0.76–1.27)
GFR calc Af Amer: 74 mL/min/{1.73_m2} (ref >59)
GFR calc non Af Amer: 64 mL/min/{1.73_m2} (ref >59)
Globulin, Total: 2.2 g/dL (ref 1.5–4.5)
Glucose: 106 mg/dL — ABNORMAL HIGH (ref 65–99)
Potassium: 5.1 mmol/L (ref 3.5–5.2)
Sodium: 139 mmol/L (ref 134–144)
Total Protein: 6.4 g/dL (ref 6.0–8.5)

## 2018-11-17 LAB — LIPID PANEL
Chol/HDL Ratio: 2.2 ratio (ref 0.0–5.0)
Cholesterol, Total: 106 mg/dL (ref 100–199)
HDL: 48 mg/dL (ref >39)
LDL Chol Calc (NIH): 42 mg/dL (ref 0–99)
Triglycerides: 80 mg/dL (ref 0–149)
VLDL Cholesterol Cal: 16 mg/dL (ref 5–40)

## 2018-12-12 DIAGNOSIS — I10 Essential (primary) hypertension: Secondary | ICD-10-CM | POA: Diagnosis not present

## 2018-12-12 DIAGNOSIS — L0889 Other specified local infections of the skin and subcutaneous tissue: Secondary | ICD-10-CM | POA: Diagnosis not present

## 2018-12-12 DIAGNOSIS — Z23 Encounter for immunization: Secondary | ICD-10-CM | POA: Diagnosis not present

## 2018-12-12 DIAGNOSIS — W5503XA Scratched by cat, initial encounter: Secondary | ICD-10-CM | POA: Diagnosis not present

## 2018-12-20 DIAGNOSIS — D510 Vitamin B12 deficiency anemia due to intrinsic factor deficiency: Secondary | ICD-10-CM | POA: Diagnosis not present

## 2018-12-20 DIAGNOSIS — E7849 Other hyperlipidemia: Secondary | ICD-10-CM | POA: Diagnosis not present

## 2018-12-20 DIAGNOSIS — R7301 Impaired fasting glucose: Secondary | ICD-10-CM | POA: Diagnosis not present

## 2018-12-27 DIAGNOSIS — Z1339 Encounter for screening examination for other mental health and behavioral disorders: Secondary | ICD-10-CM | POA: Diagnosis not present

## 2018-12-27 DIAGNOSIS — E785 Hyperlipidemia, unspecified: Secondary | ICD-10-CM | POA: Diagnosis not present

## 2018-12-27 DIAGNOSIS — R7301 Impaired fasting glucose: Secondary | ICD-10-CM | POA: Diagnosis not present

## 2018-12-27 DIAGNOSIS — M25561 Pain in right knee: Secondary | ICD-10-CM | POA: Diagnosis not present

## 2018-12-27 DIAGNOSIS — I679 Cerebrovascular disease, unspecified: Secondary | ICD-10-CM | POA: Diagnosis not present

## 2018-12-27 DIAGNOSIS — E669 Obesity, unspecified: Secondary | ICD-10-CM | POA: Diagnosis not present

## 2018-12-27 DIAGNOSIS — D510 Vitamin B12 deficiency anemia due to intrinsic factor deficiency: Secondary | ICD-10-CM | POA: Diagnosis not present

## 2018-12-27 DIAGNOSIS — I10 Essential (primary) hypertension: Secondary | ICD-10-CM | POA: Diagnosis not present

## 2018-12-27 DIAGNOSIS — C61 Malignant neoplasm of prostate: Secondary | ICD-10-CM | POA: Diagnosis not present

## 2018-12-27 DIAGNOSIS — Z0000 Encounter for general adult medical examination without abnormal findings: Secondary | ICD-10-CM | POA: Diagnosis not present

## 2018-12-27 DIAGNOSIS — Z1331 Encounter for screening for depression: Secondary | ICD-10-CM | POA: Diagnosis not present

## 2018-12-27 DIAGNOSIS — R82998 Other abnormal findings in urine: Secondary | ICD-10-CM | POA: Diagnosis not present

## 2018-12-27 DIAGNOSIS — Z23 Encounter for immunization: Secondary | ICD-10-CM | POA: Diagnosis not present

## 2018-12-29 DIAGNOSIS — Z1212 Encounter for screening for malignant neoplasm of rectum: Secondary | ICD-10-CM | POA: Diagnosis not present

## 2018-12-30 DIAGNOSIS — H1131 Conjunctival hemorrhage, right eye: Secondary | ICD-10-CM | POA: Diagnosis not present

## 2019-02-06 DIAGNOSIS — L84 Corns and callosities: Secondary | ICD-10-CM | POA: Diagnosis not present

## 2019-02-06 DIAGNOSIS — L820 Inflamed seborrheic keratosis: Secondary | ICD-10-CM | POA: Diagnosis not present

## 2019-02-06 DIAGNOSIS — L218 Other seborrheic dermatitis: Secondary | ICD-10-CM | POA: Diagnosis not present

## 2019-02-06 DIAGNOSIS — Z85828 Personal history of other malignant neoplasm of skin: Secondary | ICD-10-CM | POA: Diagnosis not present

## 2019-02-06 DIAGNOSIS — L821 Other seborrheic keratosis: Secondary | ICD-10-CM | POA: Diagnosis not present

## 2019-03-22 ENCOUNTER — Ambulatory Visit: Payer: Medicare Other | Attending: Internal Medicine

## 2019-03-22 DIAGNOSIS — Z20822 Contact with and (suspected) exposure to covid-19: Secondary | ICD-10-CM

## 2019-03-23 LAB — NOVEL CORONAVIRUS, NAA: SARS-CoV-2, NAA: NOT DETECTED

## 2019-05-08 ENCOUNTER — Other Ambulatory Visit: Payer: Self-pay

## 2019-05-08 ENCOUNTER — Ambulatory Visit (INDEPENDENT_AMBULATORY_CARE_PROVIDER_SITE_OTHER): Payer: Medicare Other | Admitting: Neurology

## 2019-05-08 ENCOUNTER — Encounter: Payer: Self-pay | Admitting: Neurology

## 2019-05-08 VITALS — BP 124/71 | HR 72 | Temp 97.3°F | Ht 72.0 in | Wt 220.0 lb

## 2019-05-08 DIAGNOSIS — R42 Dizziness and giddiness: Secondary | ICD-10-CM | POA: Diagnosis not present
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
## 2019-05-08 NOTE — Patient Instructions (Signed)
I had a long discussion with the patient with regards to his recent transient dizzy episode following  Exertion and recommend he limit his physical activities and increase them gradually as tolerated.  I recommend we check follow-up carotid ultrasound and transcranial doppler to follow his known terminal right ICA stenosis.  He will stay on aspirin for stroke prevention and maintain aggressive risk factor modification with strict control of blood pressure with goal below 130/90 and lipids with LDL cholesterol goal below 70 mg percent.Return for f/u in 1 year or earlier if needed.

## 2019-05-10 ENCOUNTER — Other Ambulatory Visit: Payer: Self-pay

## 2019-05-10 ENCOUNTER — Ambulatory Visit (HOSPITAL_COMMUNITY)
Admission: RE | Admit: 2019-05-10 | Discharge: 2019-05-10 | Disposition: A | Discharge status: Home / Self Care | Payer: Medicare Other | Source: Ambulatory Visit | Attending: Neurology | Admitting: Neurology

## 2019-05-10 ENCOUNTER — Ambulatory Visit (HOSPITAL_BASED_OUTPATIENT_CLINIC_OR_DEPARTMENT_OTHER)
Admission: RE | Admit: 2019-05-10 | Discharge: 2019-05-10 | Disposition: A | Discharge status: Home / Self Care | Payer: Medicare Other | Source: Ambulatory Visit | Attending: Neurology | Admitting: Neurology

## 2019-05-10 DIAGNOSIS — R42 Dizziness and giddiness: Secondary | ICD-10-CM | POA: Insufficient documentation

## 2019-05-10 NOTE — Progress Notes (Signed)
TCD and carotid has been completed.   Preliminary results in CV Proc.   Abram Sander 05/10/2019 2:53 PM

## 2019-05-12 ENCOUNTER — Other Ambulatory Visit: Payer: Self-pay

## 2019-05-12 ENCOUNTER — Ambulatory Visit (INDEPENDENT_AMBULATORY_CARE_PROVIDER_SITE_OTHER): Payer: Medicare Other | Admitting: Cardiovascular Disease

## 2019-05-12 ENCOUNTER — Encounter: Payer: Self-pay | Admitting: Cardiovascular Disease

## 2019-05-12 DIAGNOSIS — I6523 Occlusion and stenosis of bilateral carotid arteries: Secondary | ICD-10-CM

## 2019-05-12 DIAGNOSIS — I6502 Occlusion and stenosis of left vertebral artery: Secondary | ICD-10-CM | POA: Diagnosis not present

## 2019-05-12 DIAGNOSIS — E785 Hyperlipidemia, unspecified: Secondary | ICD-10-CM | POA: Diagnosis not present

## 2019-05-12 DIAGNOSIS — I491 Atrial premature depolarization: Secondary | ICD-10-CM

## 2019-05-12 DIAGNOSIS — I440 Atrioventricular block, first degree: Secondary | ICD-10-CM

## 2019-05-12 DIAGNOSIS — I10 Essential (primary) hypertension: Secondary | ICD-10-CM

## 2019-05-12 NOTE — Patient Instructions (Signed)
Medication Instructions:  CONTINUE WITH CURRENT MEDICATIONS. NO CHANGES.  *If you need a refill on your cardiac medications before your next appointment, please call your pharmacy*   Lab Work: IN April: FASTING CMET LIPID If you have labs (blood work) drawn today and your tests are completely normal, you will receive your results only by: Marland Kitchen MyChart Message (if you have MyChart) OR . A paper copy in the mail If you have any lab test that is abnormal or we need to change your treatment, we will call you to review the results.   Follow-Up: At [facility], you and your health needs are our priority.  As part of our continuing mission to provide you with exceptional heart care, we have created designated Provider Care Teams.  These Care Teams include your primary Cardiologist (physician) and Advanced Practice Providers (APPs -  Physician Assistants and Nurse Practitioners) who all work together to provide you with the care you need, when you need it.  We recommend signing up for the patient portal called "MyChart".  Sign up information is provided on this After Visit Summary.  MyChart is used to connect with patients for Virtual Visits (Telemedicine).  Patients are able to view lab/test results, encounter notes, upcoming appointments, etc.  Non-urgent messages can be sent to your provider as well.   To learn more about what you can do with MyChart, go to [email or web address].    Your next appointment:   7 month(s) AUGUST/ SEPTEMBER  The format for your next appointment:   In Person  Provider:   [name], MD

## 2019-05-12 NOTE — Progress Notes (Signed)
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 (physician) and Advanced Practice Providers (APPs -  Physician Assistants and Nurse Practitioners) who all work together to provide you with the care you need, when you need it.  We recommend signing up for the patient portal called "MyChart".  Sign up information is provided on this After Visit Summary.  MyChart is used to connect with patients for Virtual Visits (Telemedicine).  Patients are able to view lab/test results, encounter notes, upcoming appointments, etc.  Non-urgent messages can be sent to your provider as well.   To learn more about what you can do with MyChart, go to [email or web address]    Your  next appointment:   7 month(s) AUGUST/ SEPTEMBER  The format for your next appointment:   In Person  Provider:   TShelva Majestic MD      Signed, TShelva Majestic MD  05/13/2019  10:52 AM    [facility] [address] [place], [place], [place]  [address] Phone: [phone]

## 2019-05-13 ENCOUNTER — Encounter: Payer: Self-pay | Admitting: Cardiovascular Disease

## 2019-05-15 NOTE — Progress Notes (Signed)
Kindly inform the patient that transcranial Doppler study was normal without evidence of any major narrowing of the blood vessels in the brain.

## 2019-05-15 NOTE — Progress Notes (Signed)
Kindly inform the patient that carotid ultrasound study showed moderate narrowing of the carotid artery on the left side.  Recommend continue ongoing medical treatment and follow-up ultrasound in 6 months or earlier if he has stroke or TIA symptoms

## 2019-05-16 ENCOUNTER — Telehealth: Payer: Self-pay

## 2019-05-16 NOTE — Telephone Encounter (Signed)
I called pt. I discussed his ultrasound results and recommendations with him. He will call us with questions or concerns or if he has stroke or TIA symptoms. Pt verbalized understanding.

## 2019-05-16 NOTE — Telephone Encounter (Signed)
-----   Message from Garvin Fila, MD sent at 05/15/2019  2:15 PM EST ----- Kindly inform the patient that transcranial Doppler study was normal without evidence of any major narrowing of the blood vessels in the brain.

## 2019-05-16 NOTE — Telephone Encounter (Signed)
-----   Message from Garvin Fila, MD sent at 05/15/2019  2:55 PM EST ----- Kindly inform the patient that carotid ultrasound study showed moderate narrowing of the carotid artery on the left side.  Recommend continue ongoing medical treatment and follow-up ultrasound in 6 months or earlier if he has stroke or TIA symptoms

## 2019-05-17 DIAGNOSIS — Z86010 Personal history of colonic polyps: Secondary | ICD-10-CM | POA: Diagnosis not present

## 2019-05-17 DIAGNOSIS — R195 Other fecal abnormalities: Secondary | ICD-10-CM | POA: Diagnosis not present

## 2019-05-31 DIAGNOSIS — C61 Malignant neoplasm of prostate: Secondary | ICD-10-CM | POA: Diagnosis not present

## 2019-06-07 ENCOUNTER — Other Ambulatory Visit: Payer: Self-pay | Admitting: Urology

## 2019-06-07 DIAGNOSIS — C61 Malignant neoplasm of prostate: Secondary | ICD-10-CM

## 2019-06-07 DIAGNOSIS — N400 Enlarged prostate without lower urinary tract symptoms: Secondary | ICD-10-CM | POA: Diagnosis not present

## 2019-06-30 DIAGNOSIS — Z1159 Encounter for screening for other viral diseases: Secondary | ICD-10-CM | POA: Diagnosis not present

## 2019-06-30 DIAGNOSIS — C61 Malignant neoplasm of prostate: Secondary | ICD-10-CM | POA: Diagnosis not present

## 2019-07-05 DIAGNOSIS — K5730 Diverticulosis of large intestine without perforation or abscess without bleeding: Secondary | ICD-10-CM | POA: Diagnosis not present

## 2019-07-05 DIAGNOSIS — D123 Benign neoplasm of transverse colon: Secondary | ICD-10-CM | POA: Diagnosis not present

## 2019-07-05 DIAGNOSIS — R195 Other fecal abnormalities: Secondary | ICD-10-CM | POA: Diagnosis not present

## 2019-07-05 DIAGNOSIS — D122 Benign neoplasm of ascending colon: Secondary | ICD-10-CM | POA: Diagnosis not present

## 2019-07-05 DIAGNOSIS — D120 Benign neoplasm of cecum: Secondary | ICD-10-CM | POA: Diagnosis not present

## 2019-07-05 DIAGNOSIS — K648 Other hemorrhoids: Secondary | ICD-10-CM | POA: Diagnosis not present

## 2019-07-07 ENCOUNTER — Other Ambulatory Visit: Payer: Self-pay

## 2019-07-07 ENCOUNTER — Ambulatory Visit
Admission: RE | Admit: 2019-07-07 | Discharge: 2019-07-07 | Disposition: A | Discharge status: Home / Self Care | Payer: Medicare Other | Source: Ambulatory Visit | Attending: Urology | Admitting: Urology

## 2019-07-07 DIAGNOSIS — C61 Malignant neoplasm of prostate: Secondary | ICD-10-CM

## 2019-07-07 DIAGNOSIS — D123 Benign neoplasm of transverse colon: Secondary | ICD-10-CM | POA: Diagnosis not present

## 2019-07-07 DIAGNOSIS — D120 Benign neoplasm of cecum: Secondary | ICD-10-CM | POA: Diagnosis not present

## 2019-07-07 DIAGNOSIS — D122 Benign neoplasm of ascending colon: Secondary | ICD-10-CM | POA: Diagnosis not present

## 2019-07-07 MED ORDER — GADOBENATE DIMEGLUMINE 529 MG/ML IV SOLN
20.0000 mL | Freq: Once | INTRAVENOUS | Status: AC | PRN
  Administered 2019-07-07: 20 mL via INTRAVENOUS

## 2019-07-14 DIAGNOSIS — I6521 Occlusion and stenosis of right carotid artery: Secondary | ICD-10-CM | POA: Diagnosis not present

## 2019-07-14 DIAGNOSIS — I493 Ventricular premature depolarization: Secondary | ICD-10-CM | POA: Diagnosis not present

## 2019-07-14 DIAGNOSIS — E785 Hyperlipidemia, unspecified: Secondary | ICD-10-CM | POA: Diagnosis not present

## 2019-07-14 DIAGNOSIS — Z79899 Other long term (current) drug therapy: Secondary | ICD-10-CM | POA: Diagnosis not present

## 2019-07-14 DIAGNOSIS — I491 Atrial premature depolarization: Secondary | ICD-10-CM | POA: Diagnosis not present

## 2019-07-14 LAB — COMPREHENSIVE METABOLIC PANEL
ALT: 31 IU/L (ref 0–44)
AST: 38 IU/L (ref 0–40)
Albumin/Globulin Ratio: 2 (ref 1.2–2.2)
Albumin: 4.2 g/dL (ref 3.6–4.6)
Alkaline Phosphatase: 58 IU/L (ref 39–117)
BUN/Creatinine Ratio: 20 (ref 10–24)
BUN: 23 mg/dL (ref 8–27)
Bilirubin Total: 0.7 mg/dL (ref 0.0–1.2)
CO2: 24 mmol/L (ref 20–29)
Calcium: 9.5 mg/dL (ref 8.6–10.2)
Chloride: 102 mmol/L (ref 96–106)
Creatinine, Ser: 1.16 mg/dL (ref 0.76–1.27)
GFR calc Af Amer: 67 mL/min/{1.73_m2} (ref >59)
GFR calc non Af Amer: 58 mL/min/{1.73_m2} — ABNORMAL LOW (ref >59)
Globulin, Total: 2.1 g/dL (ref 1.5–4.5)
Glucose: 98 mg/dL (ref 65–99)
Potassium: 4.6 mmol/L (ref 3.5–5.2)
Sodium: 141 mmol/L (ref 134–144)
Total Protein: 6.3 g/dL (ref 6.0–8.5)

## 2019-07-14 LAB — LIPID PANEL
Chol/HDL Ratio: 2.3 ratio (ref 0.0–5.0)
Cholesterol, Total: 114 mg/dL (ref 100–199)
HDL: 50 mg/dL (ref >39)
LDL Chol Calc (NIH): 45 mg/dL (ref 0–99)
Triglycerides: 101 mg/dL (ref 0–149)
VLDL Cholesterol Cal: 19 mg/dL (ref 5–40)

## 2019-10-25 ENCOUNTER — Encounter: Payer: Self-pay | Admitting: Podiatry

## 2019-10-25 ENCOUNTER — Other Ambulatory Visit: Payer: Self-pay

## 2019-10-25 ENCOUNTER — Ambulatory Visit (INDEPENDENT_AMBULATORY_CARE_PROVIDER_SITE_OTHER): Payer: Medicare Other | Admitting: Podiatry

## 2019-10-25 DIAGNOSIS — M2010 Hallux valgus (acquired), unspecified foot: Secondary | ICD-10-CM

## 2019-10-25 DIAGNOSIS — L84 Corns and callosities: Secondary | ICD-10-CM | POA: Insufficient documentation

## 2019-10-25 NOTE — Progress Notes (Signed)
This patient presents the office with chief complaint of painful callusbe tween the first and second toes on both feet.  Patient also has painful callus under the ball of his big toe joints both feet.  He says this calluses have been painful for the last few months and the pain is increasing in severity.   He says the callus between the first and second digit of the left foot is most painful.  He has provided no self treatment or sought any professional help.  He presents the office today for evaluation and treatment of his painful callus  Vascular  Dorsalis pedis and posterior tibial pulses are weakly  palpable  B/L.  Capillary return  WNL.  Temperature gradient is  WNL.  Skin turgor  WNL  Sensorium  Senn Weinstein monofilament wire  Diminished  Bilaterally.. Normal tactile sensation.  Nail Exam  Patient has normal nails with no evidence of bacterial or fungal infection.  Orthopedic  Exam  Muscle tone and muscle strength  WNL.  No limitations of motion feet  B/L.  No crepitus or joint effusion noted.  Foot type is unremarkable and digits show no abnormalities.  HAV  B/L.  Skin  No open lesions.  Normal skin texture and turgor.  Callus sub 1  B/L.  Corn 1/2  B/L.  Corns.  Callus.  HAV  B/L.  IE.  Debride callus  Feet  B/L.  Padding dispensed to be worn between his 1/2 digits  B/L.  RTC prn.  Discussed the callus formation due to HAV  B/L.   Gardiner Barefoot DPM

## 2019-11-21 ENCOUNTER — Other Ambulatory Visit: Payer: Self-pay | Admitting: Cardiovascular Disease

## 2019-11-30 ENCOUNTER — Other Ambulatory Visit: Payer: Self-pay

## 2019-11-30 ENCOUNTER — Encounter: Payer: Self-pay | Admitting: Cardiovascular Disease

## 2019-11-30 ENCOUNTER — Ambulatory Visit (INDEPENDENT_AMBULATORY_CARE_PROVIDER_SITE_OTHER): Payer: Medicare Other | Admitting: Cardiovascular Disease

## 2019-11-30 VITALS — BP 140/78 | HR 66 | Ht 72.0 in | Wt 220.4 lb

## 2019-11-30 DIAGNOSIS — I6523 Occlusion and stenosis of bilateral carotid arteries: Secondary | ICD-10-CM | POA: Diagnosis not present

## 2019-11-30 DIAGNOSIS — I491 Atrial premature depolarization: Secondary | ICD-10-CM

## 2019-11-30 DIAGNOSIS — I440 Atrioventricular block, first degree: Secondary | ICD-10-CM | POA: Diagnosis not present

## 2019-11-30 DIAGNOSIS — I10 Essential (primary) hypertension: Secondary | ICD-10-CM | POA: Diagnosis not present

## 2019-11-30 DIAGNOSIS — E785 Hyperlipidemia, unspecified: Secondary | ICD-10-CM

## 2019-11-30 DIAGNOSIS — I6502 Occlusion and stenosis of left vertebral artery: Secondary | ICD-10-CM

## 2019-11-30 NOTE — Patient Instructions (Signed)
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
## 2019-12-02 ENCOUNTER — Encounter: Payer: Self-pay | Admitting: Cardiovascular Disease

## 2019-12-12 DIAGNOSIS — Z23 Encounter for immunization: Secondary | ICD-10-CM | POA: Diagnosis not present

## 2019-12-27 DIAGNOSIS — C61 Malignant neoplasm of prostate: Secondary | ICD-10-CM | POA: Diagnosis not present

## 2020-01-02 ENCOUNTER — Ambulatory Visit: Payer: Medicare Other | Admitting: Cardiovascular Disease

## 2020-01-03 DIAGNOSIS — C61 Malignant neoplasm of prostate: Secondary | ICD-10-CM | POA: Diagnosis not present

## 2020-01-03 DIAGNOSIS — N401 Enlarged prostate with lower urinary tract symptoms: Secondary | ICD-10-CM | POA: Diagnosis not present

## 2020-01-03 DIAGNOSIS — R351 Nocturia: Secondary | ICD-10-CM | POA: Diagnosis not present

## 2020-02-06 DIAGNOSIS — Z23 Encounter for immunization: Secondary | ICD-10-CM | POA: Diagnosis not present

## 2020-02-12 DIAGNOSIS — D1801 Hemangioma of skin and subcutaneous tissue: Secondary | ICD-10-CM | POA: Diagnosis not present

## 2020-02-12 DIAGNOSIS — L820 Inflamed seborrheic keratosis: Secondary | ICD-10-CM | POA: Diagnosis not present

## 2020-02-12 DIAGNOSIS — L812 Freckles: Secondary | ICD-10-CM | POA: Diagnosis not present

## 2020-02-12 DIAGNOSIS — Z85828 Personal history of other malignant neoplasm of skin: Secondary | ICD-10-CM | POA: Diagnosis not present

## 2020-02-12 DIAGNOSIS — L821 Other seborrheic keratosis: Secondary | ICD-10-CM | POA: Diagnosis not present

## 2020-03-04 DIAGNOSIS — H02834 Dermatochalasis of left upper eyelid: Secondary | ICD-10-CM | POA: Diagnosis not present

## 2020-03-04 DIAGNOSIS — H02831 Dermatochalasis of right upper eyelid: Secondary | ICD-10-CM | POA: Diagnosis not present

## 2020-03-04 DIAGNOSIS — Z961 Presence of intraocular lens: Secondary | ICD-10-CM | POA: Diagnosis not present

## 2020-03-04 DIAGNOSIS — H5213 Myopia, bilateral: Secondary | ICD-10-CM | POA: Diagnosis not present

## 2020-05-07 ENCOUNTER — Ambulatory Visit: Payer: Medicare Other | Admitting: Neurology

## 2020-06-05 ENCOUNTER — Ambulatory Visit (INDEPENDENT_AMBULATORY_CARE_PROVIDER_SITE_OTHER): Payer: Medicare Other | Admitting: Cardiovascular Disease

## 2020-06-05 ENCOUNTER — Encounter: Payer: Self-pay | Admitting: Cardiovascular Disease

## 2020-06-05 ENCOUNTER — Other Ambulatory Visit: Payer: Self-pay

## 2020-06-05 VITALS — BP 118/60 | HR 66 | Ht 76.0 in | Wt 218.4 lb

## 2020-06-05 DIAGNOSIS — I10 Essential (primary) hypertension: Secondary | ICD-10-CM | POA: Diagnosis not present

## 2020-06-05 DIAGNOSIS — I6523 Occlusion and stenosis of bilateral carotid arteries: Secondary | ICD-10-CM

## 2020-06-05 DIAGNOSIS — I6502 Occlusion and stenosis of left vertebral artery: Secondary | ICD-10-CM | POA: Diagnosis not present

## 2020-06-05 DIAGNOSIS — I440 Atrioventricular block, first degree: Secondary | ICD-10-CM

## 2020-06-05 DIAGNOSIS — E785 Hyperlipidemia, unspecified: Secondary | ICD-10-CM | POA: Diagnosis not present

## 2020-06-05 NOTE — Patient Instructions (Addendum)
Medication Instructions:  TAKE- An Extra dose of Hydrochlorothiazide as needed for swelling  *If you need a refill on your cardiac medications before your next appointment, please call your pharmacy*   Lab Work: None Ordered   Testing/Procedures: None Ordered   Follow-Up: At [facility], you and your health needs are our priority.  As part of our continuing mission to provide you with exceptional heart care, we have created designated Provider Care Teams.  These Care Teams include your primary Cardiologist (physician) and Advanced Practice Providers (APPs -  Physician Assistants and Nurse Practitioners) who all work together to provide you with the care you need, when you need it.  We recommend signing up for the patient portal called "MyChart".  Sign up information is provided on this After Visit Summary.  MyChart is used to connect with patients for Virtual Visits (Telemedicine).  Patients are able to view lab/test results, encounter notes, upcoming appointments, etc.  Non-urgent messages can be sent to your provider as well.   To learn more about what you can do with MyChart, go to [email or web address].    Your next appointment:   1 year(s)  The format for your next appointment:   In Person  Provider:   You may see [name], MD or one of the following Advanced Practice Providers on your designated Care Team:    Almyra Deforest, PA-C  Fabian Sharp, PA-C or   Roby Lofts, [place]
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
## 2020-06-07 ENCOUNTER — Encounter: Payer: Self-pay | Admitting: Cardiovascular Disease

## 2020-06-27 DIAGNOSIS — C61 Malignant neoplasm of prostate: Secondary | ICD-10-CM | POA: Diagnosis not present

## 2020-07-05 DIAGNOSIS — C61 Malignant neoplasm of prostate: Secondary | ICD-10-CM | POA: Diagnosis not present

## 2020-07-05 DIAGNOSIS — N401 Enlarged prostate with lower urinary tract symptoms: Secondary | ICD-10-CM | POA: Diagnosis not present

## 2020-07-05 DIAGNOSIS — R351 Nocturia: Secondary | ICD-10-CM | POA: Diagnosis not present

## 2020-07-09 DIAGNOSIS — E538 Deficiency of other specified B group vitamins: Secondary | ICD-10-CM | POA: Diagnosis not present

## 2020-07-09 DIAGNOSIS — E785 Hyperlipidemia, unspecified: Secondary | ICD-10-CM | POA: Diagnosis not present

## 2020-07-15 ENCOUNTER — Ambulatory Visit (INDEPENDENT_AMBULATORY_CARE_PROVIDER_SITE_OTHER): Payer: Medicare Other | Admitting: Neurology

## 2020-07-15 ENCOUNTER — Encounter: Payer: Self-pay | Admitting: Neurology

## 2020-07-15 VITALS — BP 129/77 | HR 58 | Ht 72.0 in | Wt 218.6 lb

## 2020-07-15 DIAGNOSIS — R7301 Impaired fasting glucose: Secondary | ICD-10-CM | POA: Diagnosis not present

## 2020-07-15 DIAGNOSIS — I6523 Occlusion and stenosis of bilateral carotid arteries: Secondary | ICD-10-CM | POA: Diagnosis not present

## 2020-07-15 DIAGNOSIS — I10 Essential (primary) hypertension: Secondary | ICD-10-CM | POA: Diagnosis not present

## 2020-07-15 NOTE — Patient Instructions (Signed)
I had a long d/w patient about his carotid stenosis, risk for recurrent stroke/TIAs, personally independently reviewed imaging studies and stroke evaluation results and answered questions.Continue aspirin 81 mg daily  for secondary stroke prevention and maintain strict control of hypertension with blood pressure goal below 130/90, diabetes with hemoglobin A1c goal below 6.5% and lipids with LDL cholesterol goal below 70 mg/dL. I also advised the patient to eat a healthy diet with plenty of whole grains, cereals, fruits and vegetables, exercise regularly and maintain ideal body weight.  Check follow-up carotid ultrasound and transcranial Doppler study.  Followup in the future with me in 1 year or call earlier if necessary.
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
## 2020-07-16 DIAGNOSIS — Z0000 Encounter for general adult medical examination without abnormal findings: Secondary | ICD-10-CM | POA: Diagnosis not present

## 2020-07-16 DIAGNOSIS — I679 Cerebrovascular disease, unspecified: Secondary | ICD-10-CM | POA: Diagnosis not present

## 2020-07-16 DIAGNOSIS — I10 Essential (primary) hypertension: Secondary | ICD-10-CM | POA: Diagnosis not present

## 2020-07-16 DIAGNOSIS — D510 Vitamin B12 deficiency anemia due to intrinsic factor deficiency: Secondary | ICD-10-CM | POA: Diagnosis not present

## 2020-07-16 DIAGNOSIS — E785 Hyperlipidemia, unspecified: Secondary | ICD-10-CM | POA: Diagnosis not present

## 2020-07-16 DIAGNOSIS — R82998 Other abnormal findings in urine: Secondary | ICD-10-CM | POA: Diagnosis not present

## 2020-07-16 DIAGNOSIS — M25561 Pain in right knee: Secondary | ICD-10-CM | POA: Diagnosis not present

## 2020-07-16 DIAGNOSIS — R7301 Impaired fasting glucose: Secondary | ICD-10-CM | POA: Diagnosis not present

## 2020-07-16 DIAGNOSIS — C61 Malignant neoplasm of prostate: Secondary | ICD-10-CM | POA: Diagnosis not present

## 2020-07-16 IMAGING — MR MR PROSTATE WO/W CM
56 series · 56 of 56 positions shown · IV contrast (20ML MULTIHANCE)
Comparison: 09/21/2013

CLINICAL DATA: Elevated PSA level of 9.43 on 05/21/2019. Biopsy on
01/24/2009 revealed Gleason 3+3=6 adenocarcinoma in the right
lateral apex involving 5% of 1 core.

EXAM:
MR PROSTATE WITHOUT AND WITH CONTRAST
TECHNIQUE: Multiplanar multisequence MRI images were obtained of the pelvis
centered about the prostate. Pre and post contrast images were
obtained.
CONTRAST:  20mL MULTIHANCE GADOBENATE DIMEGLUMINE 529 MG/ML IV SOLN

[Series 3: T1 · axial · 8.0mm · 1.06mm/px · 1 of 28 slices shown (1 of 2)]
[im 1/28]
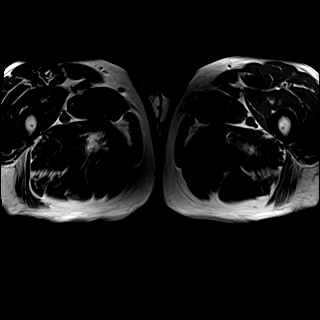

[Series 4: bSSFP fat-sat · axial · 8.0mm · 0.74mm/px · 1 of 28 slices shown]
[im 1/28]
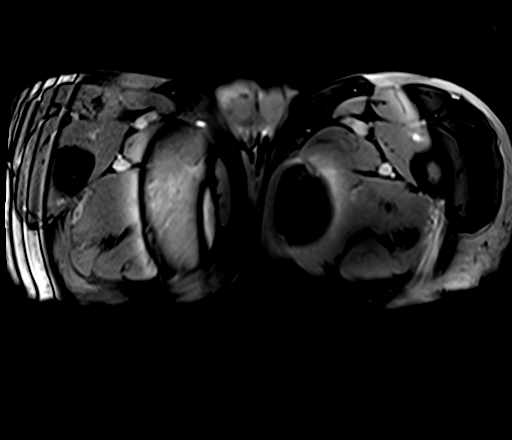

[Series 6: T2 · coronal · 3.5mm · 0.56mm/px · 1 of 24 slices shown (1 of 3)]
[im 1/24]
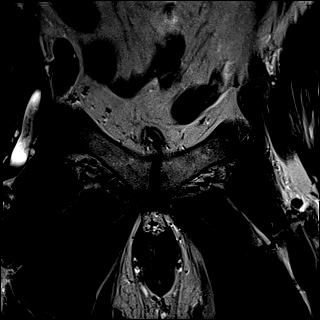

[Series 7: T1 · axial · 5.0mm · 1.25mm/px · 1 of 80 slices shown (2 of 2)]
[im 1/80]
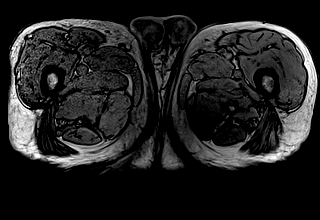

[Series 8: DWI · axial · 3.5mm · 1.75mm/px · 1 of 60 slices shown (1 of 3)]
[im 1/60]
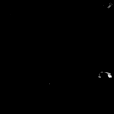

[Series 9: DWI · axial · 3.5mm · 1.75mm/px · 1 of 20 slices shown (2 of 3)]
[im 1/20]
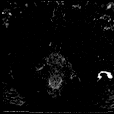

[Series 10: DWI · axial · 3.5mm · 1.56mm/px · 1 of 20 slices shown (3 of 3)]
[im 1/20]
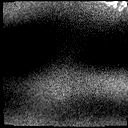

[Series 11: T2 · axial · 3.5mm · 0.56mm/px · 1 of 23 slices shown (2 of 3)]
[im 1/23]
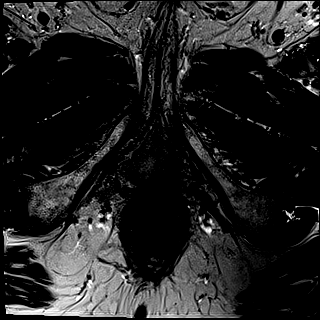

[Series 12: T2 · axial · 1.0mm · 1.04mm/px · 1 of 80 slices shown (3 of 3)]
[im 1/80]
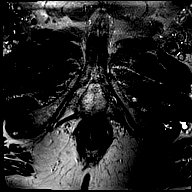

[Series 13: pre t1_twist_tra_dyn_ttc=5.3s · axial · non-contrast · 3.5mm · 0.83mm/px · 1 of 20 slices shown]
[im 1/20]
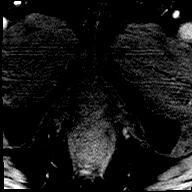

[Series 14: post t1_twist_tra_dyn-copy center · axial · 3.5mm · 0.83mm/px · 1 of 20 slices shown (1 of 24)]
[im 1/20]
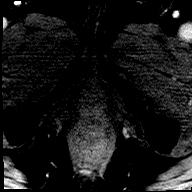

[Series 15: post t1_twist_tra_dyn-copy center · axial · 3.5mm · 0.83mm/px · 1 of 20 slices shown (2 of 24)]
[im 1/20]
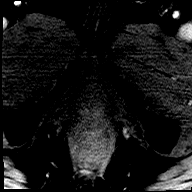

[Series 16: post t1_twist_tra_dyn-copy cent_sub_ttc=(id) · axial · 3.5mm · 0.83mm/px · 1 of 19 slices shown (1 of 22)]
[im 1/19]
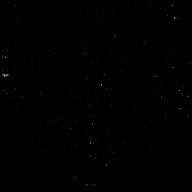

[Series 17: post t1_twist_tra_dyn-copy center · axial · 3.5mm · 0.83mm/px · 1 of 20 slices shown (3 of 24)]
[im 1/20]
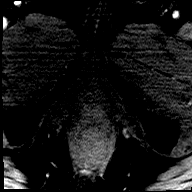

[Series 18: post t1_twist_tra_dyn-copy cent_sub_ttc=(id) · axial · 3.5mm · 0.83mm/px · 1 of 19 slices shown (2 of 22)]
[im 1/19]
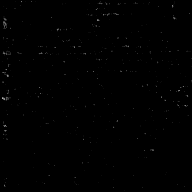

[Series 19: post t1_twist_tra_dyn-copy center · axial · 3.5mm · 0.83mm/px · 1 of 20 slices shown (4 of 24)]
[im 1/20]
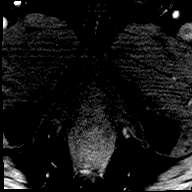

[Series 20: post t1_twist_tra_dyn-copy cent_sub_ttc=(id) · axial · 3.5mm · 0.83mm/px · 1 of 20 slices shown (3 of 22)]
[im 1/20]
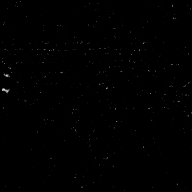

[Series 21: post t1_twist_tra_dyn-copy center · axial · 3.5mm · 0.83mm/px · 1 of 20 slices shown (5 of 24)]
[im 1/20]
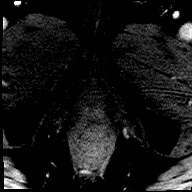

[Series 22: post t1_twist_tra_dyn-copy cent_sub_ttc=(id) · axial · 3.5mm · 0.83mm/px · 1 of 20 slices shown (4 of 22)]
[im 1/20]
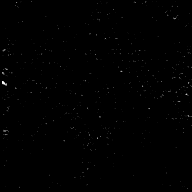

[Series 23: post t1_twist_tra_dyn-copy center · axial · 3.5mm · 0.83mm/px · 1 of 20 slices shown (6 of 24)]
[im 1/20]
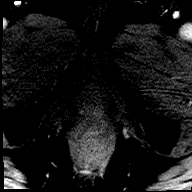

[Series 24: post t1_twist_tra_dyn-copy cent_sub_ttc=(id) · axial · 3.5mm · 0.83mm/px · 1 of 20 slices shown (5 of 22)]
[im 1/20]
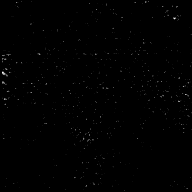

[Series 25: post t1_twist_tra_dyn-copy center · axial · 3.5mm · 0.83mm/px · 1 of 20 slices shown (7 of 24)]
[im 1/20]
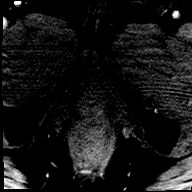

[Series 26: post t1_twist_tra_dyn-copy cent_sub_ttc=(id) · axial · 3.5mm · 0.83mm/px · 1 of 18 slices shown (6 of 22)]
[im 1/18]
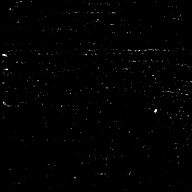

[Series 27: post t1_twist_tra_dyn-copy center · axial · 3.5mm · 0.83mm/px · 1 of 20 slices shown (8 of 24)]
[im 1/20]
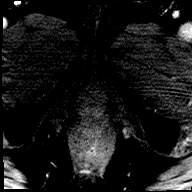

[Series 28: post t1_twist_tra_dyn-copy cent_sub_ttc=(id) · axial · 3.5mm · 0.83mm/px · 1 of 20 slices shown (7 of 22)]
[im 1/20]
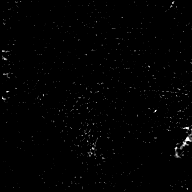

[Series 29: post t1_twist_tra_dyn-copy center · axial · 3.5mm · 0.83mm/px · 1 of 20 slices shown (9 of 24)]
[im 1/20]
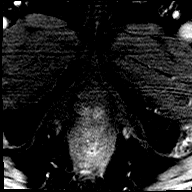

[Series 30: post t1_twist_tra_dyn-copy cent_sub_ttc=(id) · axial · 3.5mm · 0.83mm/px · 1 of 20 slices shown (8 of 22)]
[im 1/20]
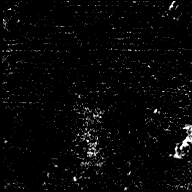

[Series 31: post t1_twist_tra_dyn-copy center · axial · 3.5mm · 0.83mm/px · 1 of 20 slices shown (10 of 24)]
[im 1/20]
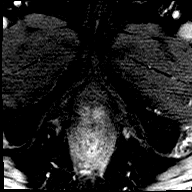

[Series 32: post t1_twist_tra_dyn-copy cent_sub_ttc=(id) · axial · 3.5mm · 0.83mm/px · 1 of 20 slices shown (9 of 22)]
[im 1/20]
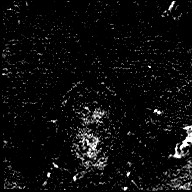

[Series 33: post t1_twist_tra_dyn-copy center · axial · 3.5mm · 0.83mm/px · 1 of 20 slices shown (11 of 24)]
[im 1/20]
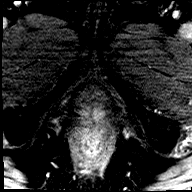

[Series 34: post t1_twist_tra_dyn-copy cent_sub_ttc=(id) · axial · 3.5mm · 0.83mm/px · 1 of 19 slices shown (10 of 22)]
[im 1/19]
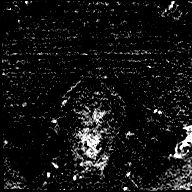

[Series 35: post t1_twist_tra_dyn-copy center · axial · 3.5mm · 0.83mm/px · 1 of 20 slices shown (12 of 24)]
[im 1/20]
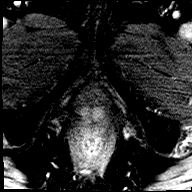

[Series 36: post t1_twist_tra_dyn-copy cent_sub_ttc=(id) · axial · 3.5mm · 0.83mm/px · 1 of 20 slices shown (11 of 22)]
[im 1/20]
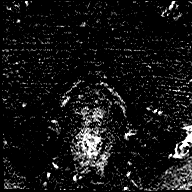

[Series 37: post t1_twist_tra_dyn-copy center · axial · 3.5mm · 0.83mm/px · 1 of 20 slices shown (13 of 24)]
[im 1/20]
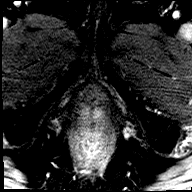

[Series 38: post t1_twist_tra_dyn-copy cent_sub_ttc=(id) · axial · 3.5mm · 0.83mm/px · 1 of 20 slices shown (12 of 22)]
[im 1/20]
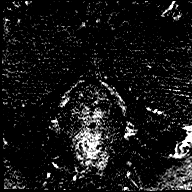

[Series 39: post t1_twist_tra_dyn-copy center · axial · 3.5mm · 0.83mm/px · 1 of 20 slices shown (14 of 24)]
[im 1/20]
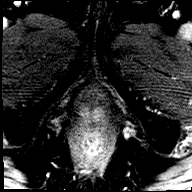

[Series 40: post t1_twist_tra_dyn-copy cent_sub_ttc=(id) · axial · 3.5mm · 0.83mm/px · 1 of 20 slices shown (13 of 22)]
[im 1/20]
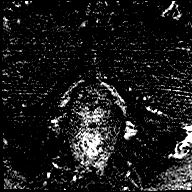

[Series 41: post t1_twist_tra_dyn-copy center · axial · 3.5mm · 0.83mm/px · 1 of 20 slices shown (15 of 24)]
[im 1/20]
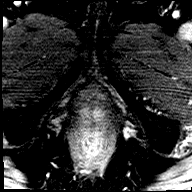

[Series 42: post t1_twist_tra_dyn-copy cent_sub_ttc=(id) · axial · 3.5mm · 0.83mm/px · 1 of 20 slices shown (14 of 22)]
[im 1/20]
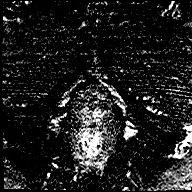

[Series 43: post t1_twist_tra_dyn-copy center · axial · 3.5mm · 0.83mm/px · 1 of 20 slices shown (16 of 24)]
[im 1/20]
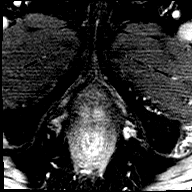

[Series 44: post t1_twist_tra_dyn-copy cent_sub_ttc=(id) · axial · 3.5mm · 0.83mm/px · 1 of 19 slices shown (15 of 22)]
[im 1/19]
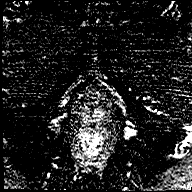

[Series 45: post t1_twist_tra_dyn-copy center · axial · 3.5mm · 0.83mm/px · 1 of 20 slices shown (17 of 24)]
[im 1/20]
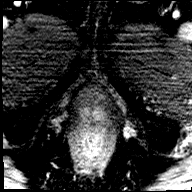

[Series 46: post t1_twist_tra_dyn-copy cent_sub_ttc=(id) · axial · 3.5mm · 0.83mm/px · 1 of 20 slices shown (16 of 22)]
[im 1/20]
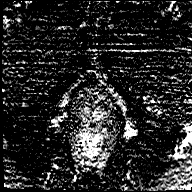

[Series 47: post t1_twist_tra_dyn-copy center · axial · 3.5mm · 0.83mm/px · 1 of 20 slices shown (18 of 24)]
[im 1/20]
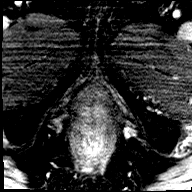

[Series 48: post t1_twist_tra_dyn-copy cent_sub_ttc=(id) · axial · 3.5mm · 0.83mm/px · 1 of 20 slices shown (17 of 22)]
[im 1/20]
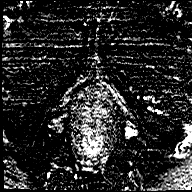

[Series 49: post t1_twist_tra_dyn-copy center · axial · 3.5mm · 0.83mm/px · 1 of 20 slices shown (19 of 24)]
[im 1/20]
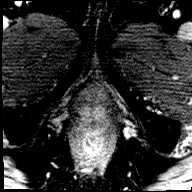

[Series 50: post t1_twist_tra_dyn-copy cent_sub_ttc=(id) · axial · 3.5mm · 0.83mm/px · 1 of 20 slices shown (18 of 22)]
[im 1/20]
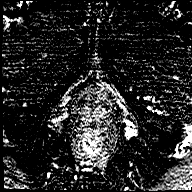

[Series 51: post t1_twist_tra_dyn-copy center · axial · 3.5mm · 0.83mm/px · 1 of 20 slices shown (20 of 24)]
[im 1/20]
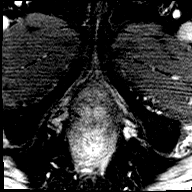

[Series 52: post t1_twist_tra_dyn-copy cent_sub_ttc=(id) · axial · 3.5mm · 0.83mm/px · 1 of 20 slices shown (19 of 22)]
[im 1/20]
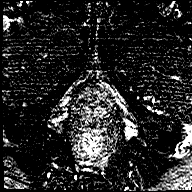

[Series 53: post t1_twist_tra_dyn-copy center · axial · 3.5mm · 0.83mm/px · 1 of 20 slices shown (21 of 24)]
[im 1/20]
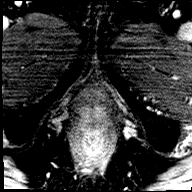

[Series 54: post t1_twist_tra_dyn-copy cent_sub_ttc=(id) · axial · 3.5mm · 0.83mm/px · 1 of 20 slices shown (20 of 22)]
[im 1/20]
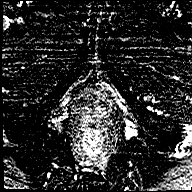

[Series 55: post t1_twist_tra_dyn-copy center · axial · 3.5mm · 0.83mm/px · 1 of 20 slices shown (22 of 24)]
[im 1/20]
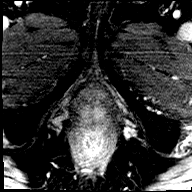

[Series 56: post t1_twist_tra_dyn-copy cent_sub_ttc=(id) · axial · 3.5mm · 0.83mm/px · 1 of 20 slices shown (21 of 22)]
[im 1/20]
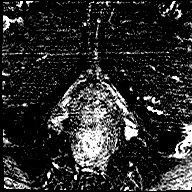

[Series 57: post t1_twist_tra_dyn-copy center · axial · 3.5mm · 0.83mm/px · 1 of 20 slices shown (23 of 24)]
[im 1/20]
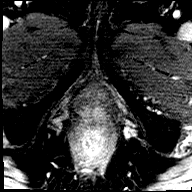

[Series 58: post t1_twist_tra_dyn-copy cent_sub_ttc=(id) · axial · 3.5mm · 0.83mm/px · 1 of 20 slices shown (22 of 22)]
[im 1/20]
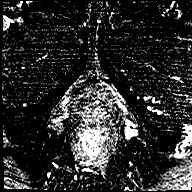

[Series 59: post t1_twist_tra_dyn-copy center · axial · 3.5mm · 0.83mm/px · 1 of 20 slices shown (24 of 24)]
[im 1/20]
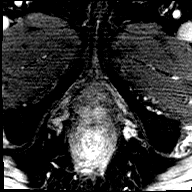

[56 of 56 positions shown; findings below may reference images not displayed]

FINDINGS: Prostate:

Region of interest # 1: PI-RADS category 3 lesion the right anterior
peripheral zone and to a lesser extent left anterior peripheral zone
primarily at the prostate apex but extending towards the mid gland,
with heterogeneous T2 signal with obscured margins, and mildly but
not prominently accentuated diffusion weighted signal
characteristics. This measures 1.83 cubic cm (1.9 by 1.6 by 1.3 cm)
and is shown on image 49/12.

There is hazy low T2 signal stranding in the peripheral zone of the
prostate gland bilaterally, with somewhat diffuse right greater than
left early enhancement in the peripheral zone most commonly
encountered in the postinflammatory setting.

Region of interest # 2: PI-RADS category 4 lesion in the right
posterolateral and posteromedial peripheral zone in the prostate mid
gland and prostate base, there is somewhat confluent reduced T2
signal along with more confluent early enhancement. This lesion
measures 0.61 cubic cm (1.9 by 0.7 by 0.8 cm). This lesion is shown
on image [DATE].

Volume: 3D volumetric analysis: Prostate volume 51.85 cubic cm (5.1
by 4.3 by 5.1 cm).

Transcapsular spread:  Absent

Seminal vesicle involvement: Absent

Neurovascular bundle involvement: Absent

Pelvic adenopathy: Absent

Bone metastasis: Absent

Other findings: Bilateral iliopsoas bursitis. Small bilateral
scrotal hydroceles.
IMPRESSION: 1. PI-RADS category 3 lesion in the right anterior and left anterior
peripheral zone favoring the prostate apex. PI-RADS category 4
lesion in the right posterolateral and posteromedial peripheral zone
at the base and mid gland. Targeting data sent to [facility].
2. Mild prostatomegaly
3. Bilateral iliopsoas bursitis and small bilateral scrotal
hydroceles.

## 2020-07-19 ENCOUNTER — Telehealth: Payer: Self-pay | Admitting: Neurology

## 2020-07-19 NOTE — Telephone Encounter (Signed)
Medicare/BCBS supp no auth require patient is ready to be scheduled. Sent a message to Butch Penny to reach out to the patient to schedule.

## 2020-07-22 NOTE — Telephone Encounter (Signed)
Scheduled at [facility] for 07/31/20.

## 2020-07-31 ENCOUNTER — Ambulatory Visit (HOSPITAL_COMMUNITY)
Admission: RE | Admit: 2020-07-31 | Discharge: 2020-07-31 | Disposition: A | Discharge status: Home / Self Care | Payer: Medicare Other | Source: Ambulatory Visit | Attending: Neurology | Admitting: Neurology

## 2020-07-31 ENCOUNTER — Ambulatory Visit (HOSPITAL_BASED_OUTPATIENT_CLINIC_OR_DEPARTMENT_OTHER)
Admission: RE | Admit: 2020-07-31 | Discharge: 2020-07-31 | Disposition: A | Discharge status: Home / Self Care | Payer: Medicare Other | Source: Ambulatory Visit | Attending: Neurology | Admitting: Neurology

## 2020-07-31 ENCOUNTER — Telehealth: Payer: Self-pay | Admitting: Emergency Medicine

## 2020-07-31 ENCOUNTER — Other Ambulatory Visit: Payer: Self-pay

## 2020-07-31 DIAGNOSIS — I6523 Occlusion and stenosis of bilateral carotid arteries: Secondary | ICD-10-CM | POA: Diagnosis not present

## 2020-07-31 NOTE — Telephone Encounter (Signed)
Called patient and discussed Carotid ultrasound results.  Patient denied further questions, verbalized understanding and expressed appreciation for the phone call.

## 2020-07-31 NOTE — Telephone Encounter (Signed)
-----   Message from Garvin Fila, MD sent at 07/31/2020  4:21 PM EDT ----- Logan French inform the patient that carotid ultrasound study shows moderate narrowing of the left carotid artery in the neck which is unchanged from previous study.  Nothing to worry about

## 2020-07-31 NOTE — Progress Notes (Signed)
Kindly inform the patient that carotid ultrasound study shows moderate narrowing of the left carotid artery in the neck which is unchanged from previous study.  Nothing to worry about

## 2020-07-31 NOTE — Progress Notes (Signed)
TCD and Carotid duplex has been completed.   Preliminary results in CV Proc.   Abram Sander 07/31/2020 1:51 PM

## 2020-08-02 NOTE — Progress Notes (Signed)
Kindly inform the patient that carotid ultrasound study showed moderate narrowing of the carotid artery in the neck on the left side which is unchanged from previous study.  No changes necessary at this time

## 2020-08-02 NOTE — Progress Notes (Signed)
Kindly inform the patient that transcranial Doppler study showed normal velocities in all blood vessels in the brain.

## 2020-08-06 ENCOUNTER — Telehealth: Payer: Self-pay | Admitting: Emergency Medicine

## 2020-08-06 NOTE — Telephone Encounter (Signed)
Called patient and discussed both the transcranial ultrasound and carotid ultrasound reports that Dr. Leonie Man reviewed with findings.  Patient denied further questions, verbalized understanding and expressed appreciation for the phone call.

## 2020-08-06 NOTE — Telephone Encounter (Signed)
-----   Message from Garvin Fila, MD sent at 08/02/2020  9:02 AM EDT ----- Mitchell Heir inform the patient that transcranial Doppler study showed normal velocities in all blood vessels in the brain.

## 2020-08-29 DIAGNOSIS — Z23 Encounter for immunization: Secondary | ICD-10-CM | POA: Diagnosis not present

## 2020-11-13 ENCOUNTER — Other Ambulatory Visit: Payer: Self-pay | Admitting: Cardiovascular Disease

## 2021-01-06 DIAGNOSIS — Z23 Encounter for immunization: Secondary | ICD-10-CM | POA: Diagnosis not present

## 2021-01-10 DIAGNOSIS — C61 Malignant neoplasm of prostate: Secondary | ICD-10-CM | POA: Diagnosis not present

## 2021-01-17 DIAGNOSIS — N401 Enlarged prostate with lower urinary tract symptoms: Secondary | ICD-10-CM | POA: Diagnosis not present

## 2021-01-17 DIAGNOSIS — R351 Nocturia: Secondary | ICD-10-CM | POA: Diagnosis not present

## 2021-01-17 DIAGNOSIS — C61 Malignant neoplasm of prostate: Secondary | ICD-10-CM | POA: Diagnosis not present

## 2021-01-31 DIAGNOSIS — Z23 Encounter for immunization: Secondary | ICD-10-CM | POA: Diagnosis not present

## 2021-02-11 DIAGNOSIS — L570 Actinic keratosis: Secondary | ICD-10-CM | POA: Diagnosis not present

## 2021-02-11 DIAGNOSIS — L853 Xerosis cutis: Secondary | ICD-10-CM | POA: Diagnosis not present

## 2021-02-11 DIAGNOSIS — L111 Transient acantholytic dermatosis [Grover]: Secondary | ICD-10-CM | POA: Diagnosis not present

## 2021-02-11 DIAGNOSIS — L820 Inflamed seborrheic keratosis: Secondary | ICD-10-CM | POA: Diagnosis not present

## 2021-02-11 DIAGNOSIS — Z85828 Personal history of other malignant neoplasm of skin: Secondary | ICD-10-CM | POA: Diagnosis not present

## 2021-02-11 DIAGNOSIS — L812 Freckles: Secondary | ICD-10-CM | POA: Diagnosis not present

## 2021-02-11 DIAGNOSIS — L821 Other seborrheic keratosis: Secondary | ICD-10-CM | POA: Diagnosis not present

## 2021-02-19 DIAGNOSIS — H5010 Unspecified exotropia: Secondary | ICD-10-CM | POA: Diagnosis not present

## 2021-02-19 DIAGNOSIS — H43813 Vitreous degeneration, bilateral: Secondary | ICD-10-CM | POA: Diagnosis not present

## 2021-02-19 DIAGNOSIS — H524 Presbyopia: Secondary | ICD-10-CM | POA: Diagnosis not present

## 2021-02-19 DIAGNOSIS — H04122 Dry eye syndrome of left lacrimal gland: Secondary | ICD-10-CM | POA: Diagnosis not present

## 2021-07-09 DIAGNOSIS — C61 Malignant neoplasm of prostate: Secondary | ICD-10-CM | POA: Diagnosis not present

## 2021-07-16 ENCOUNTER — Ambulatory Visit (INDEPENDENT_AMBULATORY_CARE_PROVIDER_SITE_OTHER): Payer: Medicare Other | Admitting: Neurology

## 2021-07-16 VITALS — BP 132/77 | HR 65 | Ht 72.0 in | Wt 217.0 lb

## 2021-07-16 DIAGNOSIS — I6522 Occlusion and stenosis of left carotid artery: Secondary | ICD-10-CM | POA: Diagnosis not present

## 2021-07-16 DIAGNOSIS — C61 Malignant neoplasm of prostate: Secondary | ICD-10-CM | POA: Diagnosis not present

## 2021-07-16 DIAGNOSIS — N401 Enlarged prostate with lower urinary tract symptoms: Secondary | ICD-10-CM | POA: Diagnosis not present

## 2021-07-16 DIAGNOSIS — R351 Nocturia: Secondary | ICD-10-CM | POA: Diagnosis not present

## 2021-07-16 NOTE — Patient Instructions (Signed)
I had a long d/w patient about his recent stroke, risk for recurrent stroke/TIAs, personally independently reviewed imaging studies and stroke evaluation results and answered questions.Continue aspirin 81 mg daily  for secondary stroke prevention and maintain strict control of hypertension with blood pressure goal below 130/90, diabetes with hemoglobin A1c goal below 6.5% and lipids with LDL cholesterol goal below 70 mg/dL. I also advised the patient to eat a healthy diet with plenty of whole grains, cereals, fruits and vegetables, exercise regularly and maintain ideal body weight . Check sctreening carotid ultrasound.Followup in the future with me in 1 year or call earlier if needed. ? ?
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
## 2021-07-21 ENCOUNTER — Telehealth: Payer: Self-pay | Admitting: Neurology

## 2021-07-21 NOTE — Telephone Encounter (Signed)
Medicare/BCBS supp no Roxanna Mew, sent Butch Penny a message she will reach out to the patient to schedule.  ?

## 2021-07-22 ENCOUNTER — Ambulatory Visit (HOSPITAL_COMMUNITY)
Admission: RE | Admit: 2021-07-22 | Discharge: 2021-07-22 | Disposition: A | Discharge status: Home / Self Care | Payer: Medicare Other | Source: Ambulatory Visit | Attending: Neurology | Admitting: Neurology

## 2021-07-22 ENCOUNTER — Encounter (HOSPITAL_COMMUNITY): Payer: Medicare Other

## 2021-07-22 DIAGNOSIS — I6522 Occlusion and stenosis of left carotid artery: Secondary | ICD-10-CM | POA: Insufficient documentation

## 2021-07-23 ENCOUNTER — Telehealth: Payer: Self-pay | Admitting: Neurology

## 2021-07-23 NOTE — Telephone Encounter (Signed)
Pt would like  call once the results from Carotid artery are reviewed.  Thank you ?

## 2021-08-01 DIAGNOSIS — I10 Essential (primary) hypertension: Secondary | ICD-10-CM | POA: Diagnosis not present

## 2021-08-01 DIAGNOSIS — D510 Vitamin B12 deficiency anemia due to intrinsic factor deficiency: Secondary | ICD-10-CM | POA: Diagnosis not present

## 2021-08-01 DIAGNOSIS — Z125 Encounter for screening for malignant neoplasm of prostate: Secondary | ICD-10-CM | POA: Diagnosis not present

## 2021-08-01 DIAGNOSIS — E785 Hyperlipidemia, unspecified: Secondary | ICD-10-CM | POA: Diagnosis not present

## 2021-08-01 DIAGNOSIS — R7301 Impaired fasting glucose: Secondary | ICD-10-CM | POA: Diagnosis not present

## 2021-08-04 NOTE — Telephone Encounter (Signed)
Pt would like a call from the nurse to discuss results.

## 2021-08-04 NOTE — Telephone Encounter (Addendum)
Mychart message sent by Dr. Leonie Man 07/24/21:  Your follow-up carotid ultrasound study shows moderate narrowing of the carotid artery on the left which is unchanged from previous study from May 2022.  Nothing to worry about. ____________________________________ I called the patient and provided the results to him. He verbalized understanding. He is having issues with his mychart account. I provided the mychart help desk number so they can provide assistance.

## 2021-08-08 DIAGNOSIS — M25561 Pain in right knee: Secondary | ICD-10-CM | POA: Diagnosis not present

## 2021-08-08 DIAGNOSIS — I679 Cerebrovascular disease, unspecified: Secondary | ICD-10-CM | POA: Diagnosis not present

## 2021-08-08 DIAGNOSIS — R82998 Other abnormal findings in urine: Secondary | ICD-10-CM | POA: Diagnosis not present

## 2021-08-08 DIAGNOSIS — G8929 Other chronic pain: Secondary | ICD-10-CM | POA: Diagnosis not present

## 2021-08-08 DIAGNOSIS — R7301 Impaired fasting glucose: Secondary | ICD-10-CM | POA: Diagnosis not present

## 2021-08-08 DIAGNOSIS — C61 Malignant neoplasm of prostate: Secondary | ICD-10-CM | POA: Diagnosis not present

## 2021-08-08 DIAGNOSIS — D510 Vitamin B12 deficiency anemia due to intrinsic factor deficiency: Secondary | ICD-10-CM | POA: Diagnosis not present

## 2021-08-08 DIAGNOSIS — Z1339 Encounter for screening examination for other mental health and behavioral disorders: Secondary | ICD-10-CM | POA: Diagnosis not present

## 2021-08-08 DIAGNOSIS — Z1331 Encounter for screening for depression: Secondary | ICD-10-CM | POA: Diagnosis not present

## 2021-08-08 DIAGNOSIS — I10 Essential (primary) hypertension: Secondary | ICD-10-CM | POA: Diagnosis not present

## 2021-08-08 DIAGNOSIS — Z0000 Encounter for general adult medical examination without abnormal findings: Secondary | ICD-10-CM | POA: Diagnosis not present

## 2021-08-08 DIAGNOSIS — E785 Hyperlipidemia, unspecified: Secondary | ICD-10-CM | POA: Diagnosis not present

## 2021-08-20 ENCOUNTER — Ambulatory Visit (INDEPENDENT_AMBULATORY_CARE_PROVIDER_SITE_OTHER): Payer: Medicare Other | Admitting: Cardiovascular Disease

## 2021-08-20 ENCOUNTER — Encounter: Payer: Self-pay | Admitting: Cardiovascular Disease

## 2021-08-20 VITALS — BP 130/60 | HR 85 | Ht 72.0 in | Wt 215.0 lb

## 2021-08-20 DIAGNOSIS — Z7901 Long term (current) use of anticoagulants: Secondary | ICD-10-CM | POA: Diagnosis not present

## 2021-08-20 DIAGNOSIS — I4891 Unspecified atrial fibrillation: Secondary | ICD-10-CM

## 2021-08-20 DIAGNOSIS — I10 Essential (primary) hypertension: Secondary | ICD-10-CM | POA: Diagnosis not present

## 2021-08-20 DIAGNOSIS — E785 Hyperlipidemia, unspecified: Secondary | ICD-10-CM

## 2021-08-20 DIAGNOSIS — I6522 Occlusion and stenosis of left carotid artery: Secondary | ICD-10-CM | POA: Diagnosis not present

## 2021-08-20 DIAGNOSIS — Z5181 Encounter for therapeutic drug level monitoring: Secondary | ICD-10-CM

## 2021-08-20 MED ORDER — METOPROLOL SUCCINATE ER 25 MG PO TB24: 25.0000 mg | ORAL_TABLET | Freq: Every day | ORAL | 6 refills | Status: DC

## 2021-08-20 MED ORDER — APIXABAN 5 MG PO TABS: 5.0000 mg | ORAL_TABLET | Freq: Two times a day (BID) | ORAL | 6 refills | Status: DC

## 2021-08-20 NOTE — Patient Instructions (Signed)
Medication Instructions:   Stop taking Aspirin  81 mg  Eliquis 5 mg  one tablet daily   Stop taking Ibuprofen   If needed may take generic tylenol or tylenol   *If you need a refill on your cardiac medications before your next appointment, please call your pharmacy*   Lab Work:in 2 weeks  [facility] Cmp Mag  If you have labs (blood work) drawn today and your tests are completely normal, you will receive your results only by: [place] (if you have MyChart) OR A paper copy in the mail If you have any lab test that is abnormal or we need to change your treatment, we will call you to review the results.   Testing/Procedures:  Your physician has requested that you have an echocardiogram. Echocardiography is a painless test that uses sound waves to create images of your heart. It provides your doctor with information about the size and shape of your heart and how well your heart's chambers and valves are working. This procedure takes approximately one hour. There are no restrictions for this procedure.   Follow-Up: At [facility], you and your health needs are our priority.  As part of our continuing mission to provide you with exceptional heart care, we have created designated Provider Care Teams.  These Care Teams include your primary Cardiologist (physician) and Advanced Practice Providers (APPs -  Physician Assistants and Nurse Practitioners) who all work together to provide you with the care you need, when you need it.     Your next appointment:   3 week(s)  The format for your next appointment:   In Person  Provider:   [name], MD    Other Instructions
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
## 2021-09-02 ENCOUNTER — Telehealth: Payer: Self-pay | Admitting: Cardiovascular Disease

## 2021-09-02 DIAGNOSIS — M25561 Pain in right knee: Secondary | ICD-10-CM | POA: Diagnosis not present

## 2021-09-02 NOTE — Telephone Encounter (Signed)
Patient came into office requesting Eliquis samples to last patient until next appointment with Dr.Kelly on 6/28.

## 2021-09-06 ENCOUNTER — Encounter: Payer: Self-pay | Admitting: Cardiovascular Disease

## 2021-09-08 ENCOUNTER — Ambulatory Visit (HOSPITAL_COMMUNITY): Payer: Medicare Other | Attending: Cardiovascular Disease

## 2021-09-08 DIAGNOSIS — I4891 Unspecified atrial fibrillation: Secondary | ICD-10-CM

## 2021-09-08 LAB — ECHOCARDIOGRAM COMPLETE
Area-P 1/2: 3.01 cm2
MV M vel: 5.45 m/s
MV Peak grad: 118.8 mmHg
P 1/2 time: 305 msec
S' Lateral: 3.2 cm

## 2021-09-09 LAB — COMPREHENSIVE METABOLIC PANEL
ALT: 35 IU/L (ref 0–44)
AST: 30 IU/L (ref 0–40)
Albumin/Globulin Ratio: 1.8 (ref 1.2–2.2)
Albumin: 4 g/dL (ref 3.6–4.6)
Alkaline Phosphatase: 63 IU/L (ref 44–121)
BUN/Creatinine Ratio: 21 (ref 10–24)
BUN: 23 mg/dL (ref 8–27)
Bilirubin Total: 0.8 mg/dL (ref 0.0–1.2)
CO2: 25 mmol/L (ref 20–29)
Calcium: 9.3 mg/dL (ref 8.6–10.2)
Chloride: 103 mmol/L (ref 96–106)
Creatinine, Ser: 1.11 mg/dL (ref 0.76–1.27)
Globulin, Total: 2.2 g/dL (ref 1.5–4.5)
Glucose: 103 mg/dL — ABNORMAL HIGH (ref 70–99)
Potassium: 4.6 mmol/L (ref 3.5–5.2)
Sodium: 142 mmol/L (ref 134–144)
Total Protein: 6.2 g/dL (ref 6.0–8.5)
eGFR: 65 mL/min/{1.73_m2} (ref >59)

## 2021-09-09 LAB — CBC
Hematocrit: 41.2 % (ref 37.5–51.0)
Hemoglobin: 13.4 g/dL (ref 13.0–17.7)
MCH: 27.5 pg (ref 26.6–33.0)
MCHC: 32.5 g/dL (ref 31.5–35.7)
MCV: 85 fL (ref 79–97)
Platelets: 270 10*3/uL (ref 150–450)
RBC: 4.87 x10E6/uL (ref 4.14–5.80)
RDW: 14.1 % (ref 11.6–15.4)
WBC: 7.8 10*3/uL (ref 3.4–10.8)

## 2021-09-09 LAB — MAGNESIUM: Magnesium: 2 mg/dL (ref 1.6–2.3)

## 2021-09-10 ENCOUNTER — Ambulatory Visit (INDEPENDENT_AMBULATORY_CARE_PROVIDER_SITE_OTHER): Payer: Medicare Other | Admitting: Cardiovascular Disease

## 2021-09-10 ENCOUNTER — Encounter: Payer: Self-pay | Admitting: Cardiovascular Disease

## 2021-09-10 VITALS — BP 122/66 | HR 84 | Ht 72.0 in | Wt 211.4 lb

## 2021-09-10 DIAGNOSIS — I4819 Other persistent atrial fibrillation: Secondary | ICD-10-CM | POA: Diagnosis not present

## 2021-09-10 DIAGNOSIS — I6522 Occlusion and stenosis of left carotid artery: Secondary | ICD-10-CM | POA: Diagnosis not present

## 2021-09-10 DIAGNOSIS — I6502 Occlusion and stenosis of left vertebral artery: Secondary | ICD-10-CM

## 2021-09-10 DIAGNOSIS — E785 Hyperlipidemia, unspecified: Secondary | ICD-10-CM | POA: Diagnosis not present

## 2021-09-10 DIAGNOSIS — Z7901 Long term (current) use of anticoagulants: Secondary | ICD-10-CM

## 2021-09-10 DIAGNOSIS — I10 Essential (primary) hypertension: Secondary | ICD-10-CM

## 2021-09-10 DIAGNOSIS — Z5181 Encounter for therapeutic drug level monitoring: Secondary | ICD-10-CM

## 2021-09-10 MED ORDER — METOPROLOL SUCCINATE ER 50 MG PO TB24: 50.0000 mg | ORAL_TABLET | Freq: Every day | ORAL | 3 refills | Status: DC

## 2021-09-10 MED ORDER — HYDROCHLOROTHIAZIDE 12.5 MG PO CAPS: 12.5000 mg | ORAL_CAPSULE | ORAL | 3 refills | Status: DC

## 2021-09-10 NOTE — Patient Instructions (Addendum)
Medication Instructions:  Take Eliquis 5 mg two times daily  INCREASE metoprolol (Toprol XL) to 50 mg daily  DECREASE hydrochlorothiazide (HCTZ) to 12.5 every other day  *If you need a refill on your cardiac medications before your next appointment, please call your pharmacy* Follow-Up: At [facility], you and your health needs are our priority.  As part of our continuing mission to provide you with exceptional heart care, we have created designated Provider Care Teams.  These Care Teams include your primary Cardiologist (physician) and Advanced Practice Providers (APPs -  Physician Assistants and Nurse Practitioners) who all work together to provide you with the care you need, when you need it.  We recommend signing up for the patient portal called "MyChart".  Sign up information is provided on this After Visit Summary.  MyChart is used to connect with patients for Virtual Visits (Telemedicine).  Patients are able to view lab/test results, encounter notes, upcoming appointments, etc.  Non-urgent messages can be sent to your provider as well.   To learn more about what you can do with MyChart, go to [email or web address].    Your next appointment:   1-2 weeks with [facility] 2-3 months with Dr. Claiborne Billings    Important Information About Sugar

## 2021-09-10 NOTE — Progress Notes (Signed)
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 study showed predominant sinus rhythm with rare PACs and PVCs without high-grade ectopy or pauses.  I reviewed his MRI as well as his MRA studies as well as the recommendations of Dr. Leonie Man.  His  cerebral angiogram showed approximately 95% plus preocclusive stenosis of the horizontal petrous segment of the right internal carotid artery.  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  I reviewed the office notes by Dr.  Sethi.  The consensus is for initial medical therapy recommendations particularly in light of the location of  his carotid stenosis.  I subsequently changed his statin to rosuvastatin.  Subsequent laboratory revealed an LDL of 83 on October 05, 2017 and Zetia was added to rosuvastatin.  At his office visit on August 20, 2021, he was found to be in atrial fibrillation for which he was completely unaware.  I do not know how long he was in it.  When he had seen Dr. Leonie Man 1 month previously  there was no mention of atrial fibrillation and no ECG was done.  He was started on metoprolol succinate 25 mg daily, aspirin was discontinued and Eliquis was initiated on August 20, 2021.  Unfortunately he apparently developed small right knee bleed leading to significant swelling and after only 48 hours stopped Eliquis.  He ultimately underwent orthopedic evaluation 1 week later with when there was complete resolution of his knee effusion.  He has now been back on once in the morning Eliquis 5 mg and presents for follow-up evaluation.  His ECG today confirms he is still in atrial fibrillation despite initiation of low-dose metoprolol succinate.  I am now increasing his metoprolol succinate to 50 mg daily and since his blood pressure is stable to avoid low blood pressure since he is not having any edema I have recommended he decrease HCTZ to 12.5 mg every other day instead of daily.  He continues to be on amlodipine 5 mg.  I provided him additional samples of Eliquis and he is to reinitiate 5 mg twice a day.  Next week I will schedule him to see our A-fib clinic for further evaluation on his increase beta-blocker and if he still is in atrial fibrillation consideration for initiation of antiarrhythmic therapy may be done at that time versus ultimate DC cardioversion after at least 3 to 4 weeks of therapeutic anticoagulation.  He continues to be followed by Dr. Diona Fanti for his elevated PSA.  He has documented stable to 60 to 79% left ICA stenosis and is followed by Dr. Leonie Man.  I will see him in 2 months for follow-up evaluation.   Medication Adjustments/Labs and Tests Ordered: Current medicines are reviewed at length with the patient today.  Concerns regarding medicines are outlined above.  Medication changes, Labs and Tests ordered today are listed in the Patient Instructions below. Patient Instructions  Medication Instructions:  Take Eliquis 5 mg two times daily  INCREASE metoprolol (Toprol XL) to 50 mg  daily  DECREASE hydrochlorothiazide (HCTZ) to 12.5 every other day  *If you need a refill on your cardiac medications before your next appointment, please call your pharmacy* Follow-Up: At [facility], you and your health needs are our priority.  As part of our continuing mission to provide you with exceptional heart care, we have created designated Provider Care Teams.  These Care Teams include your primary Cardiologist (physician) and Advanced Practice Providers (APPs -  Physician Assistants and Nurse Practitioners) who all work together to provide you with the care you need, when you need it.  We recommend signing up for the patient portal called "MyChart".  Sign up information is provided on this After Visit Summary.  MyChart is used to connect with patients for Virtual Visits (Telemedicine).  Patients are able to view lab/test results, encounter notes, upcoming appointments, etc.  Non-urgent messages can be sent to your provider as well.   To learn more about what you can do with MyChart, go to [email or web address].  Your next appointment:   1-2 weeks with Afib clinic 2-3 months with Dr. Claiborne Billings    Important Information About Sugar         Signed, [name], MD  09/10/2021 1:24 PM    [facility] [address], [place], [place], [place]  [address] Phone: [phone]

## 2021-09-23 ENCOUNTER — Other Ambulatory Visit (HOSPITAL_COMMUNITY): Payer: Self-pay | Admitting: Physician Assistant

## 2021-09-23 ENCOUNTER — Encounter (HOSPITAL_COMMUNITY): Payer: Self-pay | Admitting: Physician Assistant

## 2021-09-23 ENCOUNTER — Ambulatory Visit (HOSPITAL_COMMUNITY)
Admission: RE | Admit: 2021-09-23 | Discharge: 2021-09-23 | Disposition: A | Discharge status: Home / Self Care | Payer: Medicare Other | Source: Ambulatory Visit | Attending: Physician Assistant | Admitting: Physician Assistant

## 2021-09-23 VITALS — BP 108/82 | HR 86 | Ht 72.0 in | Wt 212.8 lb

## 2021-09-23 DIAGNOSIS — E785 Hyperlipidemia, unspecified: Secondary | ICD-10-CM | POA: Insufficient documentation

## 2021-09-23 DIAGNOSIS — I10 Essential (primary) hypertension: Secondary | ICD-10-CM | POA: Insufficient documentation

## 2021-09-23 DIAGNOSIS — I38 Endocarditis, valve unspecified: Secondary | ICD-10-CM | POA: Diagnosis not present

## 2021-09-23 DIAGNOSIS — D6869 Other thrombophilia: Secondary | ICD-10-CM | POA: Insufficient documentation

## 2021-09-23 DIAGNOSIS — Z79899 Other long term (current) drug therapy: Secondary | ICD-10-CM | POA: Insufficient documentation

## 2021-09-23 DIAGNOSIS — I4819 Other persistent atrial fibrillation: Secondary | ICD-10-CM

## 2021-09-23 DIAGNOSIS — Z7901 Long term (current) use of anticoagulants: Secondary | ICD-10-CM | POA: Diagnosis not present

## 2021-09-23 LAB — CBC
HCT: 41.1 % (ref 39.0–52.0)
Hemoglobin: 12.8 g/dL — ABNORMAL LOW (ref 13.0–17.0)
MCH: 26.6 pg (ref 26.0–34.0)
MCHC: 31.1 g/dL (ref 30.0–36.0)
MCV: 85.3 fL (ref 80.0–100.0)
Platelets: 189 10*3/uL (ref 150–400)
RBC: 4.82 MIL/uL (ref 4.22–5.81)
RDW: 15.9 % — ABNORMAL HIGH (ref 11.5–15.5)
WBC: 8.5 10*3/uL (ref 4.0–10.5)
nRBC: 0 % (ref 0.0–0.2)

## 2021-09-23 LAB — BASIC METABOLIC PANEL
Anion gap: 11 (ref 5–15)
BUN: 27 mg/dL — ABNORMAL HIGH (ref 8–23)
CO2: 22 mmol/L (ref 22–32)
Calcium: 9 mg/dL (ref 8.9–10.3)
Chloride: 106 mmol/L (ref 98–111)
Creatinine, Ser: 1.14 mg/dL (ref 0.61–1.24)
GFR, Estimated: >60 mL/min (ref >60)
Glucose, Bld: 92 mg/dL (ref 70–99)
Potassium: 4 mmol/L (ref 3.5–5.1)
Sodium: 139 mmol/L (ref 135–145)

## 2021-09-23 NOTE — Progress Notes (Signed)
Primary Care Physician: Donnajean Lopes, MD Primary Cardiologist: Dr Claiborne Billings Primary Electrophysiologist: none Referring Physician: Dr Marty Heck is a 87 y.o. male with a history of HLD, HTN, MR, carotid artery disease, prostate cancer, atrial fibrillation who presents for consultation in the [facility].  The patient was initially diagnosed with atrial fibrillation 08/20/21 at a routine visit with Dr Claiborne Billings. Patient was unaware of his arrhythmia. He was started on metoprolol for rate control and Eliquis for a CHADS2VASC score of 4. Patient did have knee swelling after starting Eliquis, felt to be secondary to a small hematoma, now resolved. Patient still in rate controlled afib today. No specific triggers that he could identify.   Today, he denies symptoms of palpitations, chest pain, shortness of breath, orthopnea, PND, lower extremity edema, dizziness, presyncope, syncope, snoring, daytime somnolence, bleeding, or neurologic sequela. The patient is tolerating medications without difficulties and is otherwise without complaint today.    Atrial Fibrillation Risk Factors:  he does not have symptoms or diagnosis of sleep apnea. he does not have a history of rheumatic fever.   he has a BMI of Body mass index is 28.86 kg/m.Marland Kitchen Filed Weights   09/23/21 1327  Weight: 96.5 kg    No family history on file.   Atrial Fibrillation Management history:  Previous antiarrhythmic drugs: none Previous cardioversions: none Previous ablations: none CHADS2VASC score: 4 Anticoagulation history: Eliquis   Past Medical History:  Diagnosis Date   B12 deficiency    Diverticulosis    Fatigue    History of colonic polyps    Hyperlipemia    Hypertension    Pernicious anemia    Prostate cancer ([place])    Syncope    Syncope    Past Surgical History:  Procedure Laterality Date   IR ANGIO INTRA EXTRACRAN SEL COM CAROTID INNOMINATE BILAT MOD SED   09/10/2017   IR ANGIO VERTEBRAL SEL SUBCLAVIAN INNOMINATE BILAT MOD SED  09/10/2017    Current Outpatient Medications  Medication Sig Dispense Refill   amLODipine (NORVASC) 5 MG tablet Take 5 mg by mouth daily.      apixaban (ELIQUIS) 5 MG TABS tablet Take 1 tablet (5 mg total) by mouth 2 (two) times daily. 60 tablet 6   ezetimibe (ZETIA) 10 MG tablet      finasteride (PROSCAR) 5 MG tablet Take 5 mg by mouth daily.     hydrochlorothiazide (MICROZIDE) 12.5 MG capsule Take 1 capsule (12.5 mg total) by mouth every other day. 90 capsule 3   metoprolol succinate (TOPROL XL) 50 MG 24 hr tablet Take 1 tablet (50 mg total) by mouth daily. 30 tablet 3   rosuvastatin (CRESTOR) 20 MG tablet TAKE ONE TABLET DAILY 90 tablet 3   vitamin B-12 (CYANOCOBALAMIN) 500 MCG tablet Take 500 mcg by mouth daily.      No current facility-administered medications for this encounter.    No Known Allergies  Social History   Socioeconomic History   Marital status: Married    Spouse name: Not on file   Number of children: Not on file   Years of education: Not on file   Highest education level: Not on file  Occupational History   Not on file  Tobacco Use   Smoking status: Never   Smokeless tobacco: Never   Tobacco comments:    Never smoke 09/23/21  Substance and Sexual Activity   Alcohol use: Yes    Alcohol/week: 1.0 standard drink of  alcohol    Types: 1 Cans of beer per week    Comment: occasionally   Drug use: No   Sexual activity: Not on file  Other Topics Concern   Not on file  Social History Narrative   Lives with wife   Right Handed   Drinks 1-2 cups daily   Social Determinants of Health   Financial Resource Strain: Not on file  Food Insecurity: Not on file  Transportation Needs: Not on file  Physical Activity: Not on file  Stress: Not on file  Social Connections: Not on file  Intimate Partner Violence: Not on file     ROS- All systems are reviewed and negative except as per the HPI  above.  Physical Exam: Vitals:   09/23/21 1327  BP: 108/82  Pulse: 86  Weight: 96.5 kg  Height: 6' (1.829 m)    GEN- The patient is a well appearing elderly male, alert and oriented x 3 today.   Head- normocephalic, atraumatic Eyes-  Sclera clear, conjunctiva pink Ears- hearing intact Oropharynx- clear Neck- supple  Lungs- Clear to ausculation bilaterally, normal work of breathing Heart- irregular rate and rhythm, no murmurs, rubs or gallops  GI- soft, NT, ND, + BS Extremities- no clubbing, cyanosis, or edema MS- no significant deformity or atrophy Skin- no rash or lesion Psych- euthymic mood, full affect Neuro- strength and sensation are intact  Wt Readings from Last 3 Encounters:  09/23/21 96.5 kg  09/10/21 95.9 kg  08/20/21 97.5 kg    EKG today demonstrates  Afib Vent. rate 86 BPM PR interval * ms QRS duration 78 ms QT/QTcB 374/447 ms  Echo 09/08/21 demonstrated   1. Left ventricular ejection fraction, by estimation, is 60 to 65%. The  left ventricle has normal function. The left ventricle has no regional  wall motion abnormalities. There is mild concentric left ventricular  hypertrophy. Left ventricular diastolic parameters are indeterminate.   2. Right ventricular systolic function is normal. The right ventricular  size is normal. There is normal pulmonary artery systolic pressure.   3. Left atrial size was mildly dilated.   4. Right atrial size was mildly dilated.   5. The mitral valve is normal in structure. Mild to moderate mitral valve  regurgitation.   6. The aortic valve is tricuspid. Aortic valve regurgitation is mild.  Aortic valve sclerosis/calcification is present, without any evidence of  aortic stenosis.   Epic records are reviewed at length today  CHA2DS2-VASc Score = 4  The patient's score is based upon: CHF History: 0 HTN History: 1 Diabetes History: 0 Stroke History: 0 Vascular Disease History: 1 Age Score: 2 Gender Score: 0        ASSESSMENT AND PLAN: 1. Persistent Atrial Fibrillation (ICD10:  I48.19) The patient's CHA2DS2-VASc score is 4, indicating a 4.8% annual risk of stroke.   We discussed rate vs rhythm control today. Would like a trial of SR. Will plan for DCCV after 3 weeks of anticoagulation. If he has early return of his afib, could consider AAD but he may ultimately decide on rate control given his age and paucity of symptoms.  Continue Eliquis 5 mg BID Continue Toprol 50 mg daily  2. Secondary Hypercoagulable State (ICD10:  D68.69) The patient is at significant risk for stroke/thromboembolism based upon his CHA2DS2-VASc Score of 4.  Continue Apixaban (Eliquis).   3. Valvular heart disease Mild-moderate MR Followed by Dr Claiborne Billings  4. HTN Stable, no changes today.   Follow up in the AF clinic  post DCCV.    [facility] [address] [place], [place] [address] [phone] 09/23/2021 1:34 PM

## 2021-09-23 NOTE — Patient Instructions (Signed)
Cardioversion scheduled for July. 25th 2023  - Arrive at the [facility] and go to admitting at 9:00am  - Do not eat or drink anything after midnight the night prior to your procedure.  - Take all your morning medication (except diabetic medications) with a sip of water prior to arrival.  - You will not be able to drive home after your procedure.  - Do NOT miss any doses of your blood thinner - if you should miss a dose please notify our office immediately.  - If you feel as if you go back into normal rhythm prior to scheduled cardioversion, please notify our office immediately. If your procedure is canceled in the cardioversion suite you will be charged a cancellation fee.

## 2021-09-23 NOTE — H&P (View-Only) (Signed)
Primary Care Physician: Donnajean Lopes, MD Primary Cardiologist: Dr Claiborne Billings Primary Electrophysiologist: none Referring Physician: Dr Marty Heck is a 87 y.o. male with a history of HLD, HTN, MR, carotid artery disease, prostate cancer, atrial fibrillation who presents for consultation in the [facility].  The patient was initially diagnosed with atrial fibrillation 08/20/21 at a routine visit with Dr Claiborne Billings. Patient was unaware of his arrhythmia. He was started on metoprolol for rate control and Eliquis for a CHADS2VASC score of 4. Patient did have knee swelling after starting Eliquis, felt to be secondary to a small hematoma, now resolved. Patient still in rate controlled afib today. No specific triggers that he could identify.   Today, he denies symptoms of palpitations, chest pain, shortness of breath, orthopnea, PND, lower extremity edema, dizziness, presyncope, syncope, snoring, daytime somnolence, bleeding, or neurologic sequela. The patient is tolerating medications without difficulties and is otherwise without complaint today.    Atrial Fibrillation Risk Factors:  he does not have symptoms or diagnosis of sleep apnea. he does not have a history of rheumatic fever.   he has a BMI of Body mass index is 28.86 kg/m.Marland Kitchen Filed Weights   09/23/21 1327  Weight: 96.5 kg    No family history on file.   Atrial Fibrillation Management history:  Previous antiarrhythmic drugs: none Previous cardioversions: none Previous ablations: none CHADS2VASC score: 4 Anticoagulation history: Eliquis   Past Medical History:  Diagnosis Date   B12 deficiency    Diverticulosis    Fatigue    History of colonic polyps    Hyperlipemia    Hypertension    Pernicious anemia    Prostate cancer ([place])    Syncope    Syncope    Past Surgical History:  Procedure Laterality Date   IR ANGIO INTRA EXTRACRAN SEL COM CAROTID INNOMINATE BILAT MOD SED   09/10/2017   IR ANGIO VERTEBRAL SEL SUBCLAVIAN INNOMINATE BILAT MOD SED  09/10/2017    Current Outpatient Medications  Medication Sig Dispense Refill   amLODipine (NORVASC) 5 MG tablet Take 5 mg by mouth daily.      apixaban (ELIQUIS) 5 MG TABS tablet Take 1 tablet (5 mg total) by mouth 2 (two) times daily. 60 tablet 6   ezetimibe (ZETIA) 10 MG tablet      finasteride (PROSCAR) 5 MG tablet Take 5 mg by mouth daily.     hydrochlorothiazide (MICROZIDE) 12.5 MG capsule Take 1 capsule (12.5 mg total) by mouth every other day. 90 capsule 3   metoprolol succinate (TOPROL XL) 50 MG 24 hr tablet Take 1 tablet (50 mg total) by mouth daily. 30 tablet 3   rosuvastatin (CRESTOR) 20 MG tablet TAKE ONE TABLET DAILY 90 tablet 3   vitamin B-12 (CYANOCOBALAMIN) 500 MCG tablet Take 500 mcg by mouth daily.      No current facility-administered medications for this encounter.    No Known Allergies  Social History   Socioeconomic History   Marital status: Married    Spouse name: Not on file   Number of children: Not on file   Years of education: Not on file   Highest education level: Not on file  Occupational History   Not on file  Tobacco Use   Smoking status: Never   Smokeless tobacco: Never   Tobacco comments:    Never smoke 09/23/21  Substance and Sexual Activity   Alcohol use: Yes    Alcohol/week: 1.0 standard drink of  alcohol    Types: 1 Cans of beer per week    Comment: occasionally   Drug use: No   Sexual activity: Not on file  Other Topics Concern   Not on file  Social History Narrative   Lives with wife   Right Handed   Drinks 1-2 cups daily   Social Determinants of Health   Financial Resource Strain: Not on file  Food Insecurity: Not on file  Transportation Needs: Not on file  Physical Activity: Not on file  Stress: Not on file  Social Connections: Not on file  Intimate Partner Violence: Not on file     ROS- All systems are reviewed and negative except as per the HPI  above.  Physical Exam: Vitals:   09/23/21 1327  BP: 108/82  Pulse: 86  Weight: 96.5 kg  Height: 6' (1.829 m)    GEN- The patient is a well appearing elderly male, alert and oriented x 3 today.   Head- normocephalic, atraumatic Eyes-  Sclera clear, conjunctiva pink Ears- hearing intact Oropharynx- clear Neck- supple  Lungs- Clear to ausculation bilaterally, normal work of breathing Heart- irregular rate and rhythm, no murmurs, rubs or gallops  GI- soft, NT, ND, + BS Extremities- no clubbing, cyanosis, or edema MS- no significant deformity or atrophy Skin- no rash or lesion Psych- euthymic mood, full affect Neuro- strength and sensation are intact  Wt Readings from Last 3 Encounters:  09/23/21 96.5 kg  09/10/21 95.9 kg  08/20/21 97.5 kg    EKG today demonstrates  Afib Vent. rate 86 BPM PR interval * ms QRS duration 78 ms QT/QTcB 374/447 ms  Echo 09/08/21 demonstrated   1. Left ventricular ejection fraction, by estimation, is 60 to 65%. The  left ventricle has normal function. The left ventricle has no regional  wall motion abnormalities. There is mild concentric left ventricular  hypertrophy. Left ventricular diastolic parameters are indeterminate.   2. Right ventricular systolic function is normal. The right ventricular  size is normal. There is normal pulmonary artery systolic pressure.   3. Left atrial size was mildly dilated.   4. Right atrial size was mildly dilated.   5. The mitral valve is normal in structure. Mild to moderate mitral valve  regurgitation.   6. The aortic valve is tricuspid. Aortic valve regurgitation is mild.  Aortic valve sclerosis/calcification is present, without any evidence of  aortic stenosis.   Epic records are reviewed at length today  CHA2DS2-VASc Score = 4  The patient's score is based upon: CHF History: 0 HTN History: 1 Diabetes History: 0 Stroke History: 0 Vascular Disease History: 1 Age Score: 2 Gender Score: 0        ASSESSMENT AND PLAN: 1. Persistent Atrial Fibrillation (ICD10:  I48.19) The patient's CHA2DS2-VASc score is 4, indicating a 4.8% annual risk of stroke.   We discussed rate vs rhythm control today. Would like a trial of SR. Will plan for DCCV after 3 weeks of anticoagulation. If he has early return of his afib, could consider AAD but he may ultimately decide on rate control given his age and paucity of symptoms.  Continue Eliquis 5 mg BID Continue Toprol 50 mg daily  2. Secondary Hypercoagulable State (ICD10:  D68.69) The patient is at significant risk for stroke/thromboembolism based upon his CHA2DS2-VASc Score of 4.  Continue Apixaban (Eliquis).   3. Valvular heart disease Mild-moderate MR Followed by Dr Claiborne Billings  4. HTN Stable, no changes today.   Follow up in the AF clinic  post DCCV.    [facility] [address] [place], [place] [address] [phone] 09/23/2021 1:34 PM

## 2021-09-30 ENCOUNTER — Encounter (HOSPITAL_COMMUNITY): Payer: Self-pay | Admitting: Cardiology

## 2021-09-30 NOTE — Progress Notes (Signed)
Attempted to obtain medical history via telephone, unable to reach at this time. HIPAA compliant voicemail message left requesting return call to pre surgical testing department. 

## 2021-10-07 ENCOUNTER — Ambulatory Visit (HOSPITAL_COMMUNITY): Payer: Medicare Other | Admitting: General Practice

## 2021-10-07 ENCOUNTER — Encounter (HOSPITAL_COMMUNITY): Payer: Self-pay | Admitting: Cardiology

## 2021-10-07 ENCOUNTER — Ambulatory Visit (HOSPITAL_COMMUNITY)
Admission: RE | Admit: 2021-10-07 | Discharge: 2021-10-07 | Disposition: A | Discharge status: Home / Self Care | Payer: Medicare Other | Attending: Cardiology | Admitting: Cardiology

## 2021-10-07 ENCOUNTER — Ambulatory Visit (HOSPITAL_BASED_OUTPATIENT_CLINIC_OR_DEPARTMENT_OTHER): Payer: Medicare Other | Admitting: General Practice

## 2021-10-07 ENCOUNTER — Other Ambulatory Visit: Payer: Self-pay

## 2021-10-07 ENCOUNTER — Encounter (HOSPITAL_COMMUNITY)
Admission: RE | Disposition: A | Discharge status: Home / Self Care | Payer: Self-pay | Source: Home / Self Care | Attending: Cardiology

## 2021-10-07 DIAGNOSIS — I340 Nonrheumatic mitral (valve) insufficiency: Secondary | ICD-10-CM | POA: Insufficient documentation

## 2021-10-07 DIAGNOSIS — E785 Hyperlipidemia, unspecified: Secondary | ICD-10-CM | POA: Insufficient documentation

## 2021-10-07 DIAGNOSIS — D649 Anemia, unspecified: Secondary | ICD-10-CM | POA: Diagnosis not present

## 2021-10-07 DIAGNOSIS — Z8546 Personal history of malignant neoplasm of prostate: Secondary | ICD-10-CM | POA: Insufficient documentation

## 2021-10-07 DIAGNOSIS — I4891 Unspecified atrial fibrillation: Secondary | ICD-10-CM

## 2021-10-07 DIAGNOSIS — I4819 Other persistent atrial fibrillation: Secondary | ICD-10-CM

## 2021-10-07 DIAGNOSIS — Z79899 Other long term (current) drug therapy: Secondary | ICD-10-CM | POA: Insufficient documentation

## 2021-10-07 DIAGNOSIS — D6869 Other thrombophilia: Secondary | ICD-10-CM | POA: Diagnosis not present

## 2021-10-07 DIAGNOSIS — Z7901 Long term (current) use of anticoagulants: Secondary | ICD-10-CM | POA: Insufficient documentation

## 2021-10-07 DIAGNOSIS — I10 Essential (primary) hypertension: Secondary | ICD-10-CM | POA: Diagnosis not present

## 2021-10-07 HISTORY — PX: CARDIOVERSION: SHX1299

## 2021-10-07 LAB — POCT I-STAT, CHEM 8
BUN: 22 mg/dL (ref 8–23)
Calcium, Ion: 1.07 mmol/L — ABNORMAL LOW (ref 1.15–1.40)
Chloride: 107 mmol/L (ref 98–111)
Creatinine, Ser: 1 mg/dL (ref 0.61–1.24)
Glucose, Bld: 116 mg/dL — ABNORMAL HIGH (ref 70–99)
HCT: 44 % (ref 39.0–52.0)
Hemoglobin: 15 g/dL (ref 13.0–17.0)
Potassium: 4.4 mmol/L (ref 3.5–5.1)
Sodium: 141 mmol/L (ref 135–145)
TCO2: 26 mmol/L (ref 22–32)

## 2021-10-07 SURGERY — CARDIOVERSION
Anesthesia: General

## 2021-10-07 MED ORDER — PROPOFOL 10 MG/ML IV BOLUS
INTRAVENOUS | Status: DC | PRN
  Administered 2021-10-07: 20 mg via INTRAVENOUS
  Administered 2021-10-07: 50 mg via INTRAVENOUS

## 2021-10-07 MED ORDER — LIDOCAINE 2% (20 MG/ML) 5 ML SYRINGE
INTRAMUSCULAR | Status: DC | PRN
  Administered 2021-10-07: 60 mg via INTRAVENOUS

## 2021-10-07 MED ORDER — SODIUM CHLORIDE 0.9 % IV SOLN: INTRAVENOUS | Status: DC

## 2021-10-07 NOTE — Anesthesia Preprocedure Evaluation (Addendum)
Anesthesia Evaluation  Patient identified by MRN, date of birth, ID band Patient awake    Reviewed: Allergy & Precautions, NPO status , Patient's Chart, lab work & pertinent test results  Airway Mallampati: II  TM Distance: >3 FB Neck ROM: Full    Dental no notable dental hx.    Pulmonary neg pulmonary ROS,    Pulmonary exam normal        Cardiovascular hypertension, Pt. on medications and Pt. on home beta blockers + dysrhythmias Atrial Fibrillation  Rhythm:Irregular Rate:Normal     Neuro/Psych negative neurological ROS  negative psych ROS   GI/Hepatic negative GI ROS, Neg liver ROS,   Endo/Other  negative endocrine ROS  Renal/GU negative Renal ROS  negative genitourinary   Musculoskeletal negative musculoskeletal ROS (+)   Abdominal Normal abdominal exam  (+)   Peds  Hematology  (+) Blood dyscrasia, anemia ,   Anesthesia Other Findings   Reproductive/Obstetrics                            Anesthesia Physical Anesthesia Plan  ASA: 3  Anesthesia Plan: General   Post-op Pain Management:    Induction: Intravenous  PONV Risk Score and Plan: 2 and Propofol infusion and Treatment may vary due to age or medical condition  Airway Management Planned: Mask  Additional Equipment: None  Intra-op Plan:   Post-operative Plan:   Informed Consent: I have reviewed the patients History and Physical, chart, labs and discussed the procedure including the risks, benefits and alternatives for the proposed anesthesia with the patient or authorized representative who has indicated his/her understanding and acceptance.     Dental advisory given  Plan Discussed with: CRNA  Anesthesia Plan Comments: (Lab Results      Component                Value               Date                      WBC                      8.5                 09/23/2021                HGB                      12.8 (L)             09/23/2021                HCT                      41.1                09/23/2021                MCV                      85.3                09/23/2021                PLT                      189  09/23/2021           Lab Results      Component                Value               Date                      NA                       139                 09/23/2021                K                        4.0                 09/23/2021                CO2                      22                  09/23/2021                GLUCOSE                  92                  09/23/2021                BUN                      27 (H)              09/23/2021                CREATININE               1.14                09/23/2021                CALCIUM                  9.0                 09/23/2021                EGFR                     65                  09/08/2021                GFRNONAA                 >60                 09/23/2021           ECHO 06/23: 1. Left ventricular ejection fraction, by estimation, is 60 to 65%. The  left ventricle has normal function. The left ventricle has no regional  wall motion abnormalities. There is mild concentric left ventricular  hypertrophy. Left ventricular diastolic  parameters are indeterminate.  2. Right ventricular systolic function is normal. The right ventricular  size is normal. There is normal pulmonary artery systolic pressure.  3. Left atrial size was mildly dilated.  4. Right atrial size was mildly dilated.  5. The mitral valve is normal in structure. Mild to moderate mitral valve  regurgitation.  6. The aortic valve is tricuspid. Aortic valve regurgitation is mild.  Aortic valve sclerosis/calcification is present, without any evidence of  aortic stenosis. )       Anesthesia Quick Evaluation

## 2021-10-07 NOTE — Transfer of Care (Signed)
Immediate Anesthesia Transfer of Care Note  Patient: Logan French  Procedure(s) Performed: CARDIOVERSION  Patient Location: Endoscopy Unit  Anesthesia Type:General  Level of Consciousness: awake and drowsy  Airway & Oxygen Therapy: Patient Spontanous Breathing  Post-op Assessment: Report given to RN and Post -op Vital signs reviewed and stable  Post vital signs: Reviewed and stable  Last Vitals:  Vitals Value Taken Time  BP 115/85 (94)   Temp    Pulse 64   Resp 18   SpO2 94     Last Pain:  Vitals:   10/07/21 0910  TempSrc: Temporal  PainSc: 0-No pain         Complications: No notable events documented.

## 2021-10-07 NOTE — Anesthesia Procedure Notes (Signed)
Procedure Name: General with mask airway Date/Time: 10/07/2021 9:39 AM  Performed by: Dorann Lodge, CRNAPre-anesthesia Checklist: Patient identified, Emergency Drugs available, Suction available and Patient being monitored Patient Re-evaluated:Patient Re-evaluated prior to induction Oxygen Delivery Method: Ambu bag Preoxygenation: Pre-oxygenation with 100% oxygen Induction Type: IV induction Dental Injury: Teeth and Oropharynx as per pre-operative assessment

## 2021-10-07 NOTE — Discharge Instructions (Signed)
Electrical Cardioversion  Electrical cardioversion is the delivery of a jolt of electricity to restore a normal rhythm to the heart. A rhythm that is too fast or is not regular keeps the heart from pumping well. In this procedure, sticky patches or metal paddles are placed on the chest to deliver electricity to the heart from a device.  What can I expect after the procedure?  Your blood pressure, heart rate, breathing rate, and blood oxygen level will be monitored until you leave the hospital or clinic.  Your heart rhythm will be watched to make sure it does not change.  You may have some redness on the skin where the shocks were given.If this occurs, can use hydrocortisone cream or Aloe vera.  Follow these instructions at home:  Do not drive for 24 hours if you were given a sedative during your procedure.  Take over-the-counter and prescription medicines only as told by your health care provider.  Ask your health care provider how to check your pulse. Check it often.  Rest for 48 hours after the procedure or as told by your health care provider.  Avoid or limit your caffeine use as told by your health care provider.  Keep all follow-up visits as told by your health care provider. This is important.  Contact a health care provider if:  You feel like your heart is beating too quickly or your pulse is not regular.  You have a serious muscle cramp that does not go away.  Get help right away if:  You have discomfort in your chest.  You are dizzy or you feel faint.  You have trouble breathing or you are short of breath.  Your speech is slurred.  You have trouble moving an arm or leg on one side of your body.  Your fingers or toes turn cold or blue.  Summary  Electrical cardioversion is the delivery of a jolt of electricity to restore a normal rhythm to the heart.  This procedure may be done right away in an emergency or may be a scheduled procedure if the condition is not  an emergency.  Generally, this is a safe procedure.  After the procedure, check your pulse often as told by your health care provider.  This information is not intended to replace advice given to you by your health care provider. Make sure you discuss any questions you have with your health care provider. Document Revised: 10/03/2018 Document Reviewed: 10/03/2018 Elsevier Patient Education  2021 [employer]  

## 2021-10-07 NOTE — Anesthesia Postprocedure Evaluation (Signed)
Anesthesia Post Note  Patient: Logan French  Procedure(s) Performed: CARDIOVERSION     Patient location during evaluation: Endoscopy Anesthesia Type: General Level of consciousness: awake and alert Pain management: pain level controlled Vital Signs Assessment: post-procedure vital signs reviewed and stable Respiratory status: spontaneous breathing, nonlabored ventilation and respiratory function stable Cardiovascular status: blood pressure returned to baseline and stable Postop Assessment: no apparent nausea or vomiting Anesthetic complications: no   No notable events documented.  Last Vitals:  Vitals:   10/07/21 1010 10/07/21 1020  BP: 133/74 (!) 133/91  Pulse: 63 72  Resp: 10 15  Temp:    SpO2: 94% 97%    Last Pain:  Vitals:   10/07/21 1020  TempSrc:   PainSc: 0-No pain                 Belenda Cruise P Callyn Severtson

## 2021-10-07 NOTE — CV Procedure (Signed)
   Electrical Cardioversion Procedure Note Logan French [id number] 07-Mar-1935  Procedure: Electrical Cardioversion Indications:  Atrial Fibrillation  Time Out: Verified patient identification, verified procedure,medications/allergies/relevent history reviewed, required imaging and test results available.  Performed  Procedure Details  The patient signed informed consent.   The patient was NPO past midnight. Has had therapeutic anticoagulation with Eliquis greater than 3 weeks. The patient denies any interruption of anticoagulation.  Anesthesia was administered by Dr. Lanetta Inch.  Adequate airway was maintained throughout and vital followed per protocol.  He was cardioverted x 1 with 200 J of biphasic synchronized energy.  He converted to NSR.  There were no apparent complications.  The patient tolerated the procedure well and had normal neuro status and respiratory status post procedure with vitals stable as recorded elsewhere.     IMPRESSION:  Successful cardioversion of atrial fibrillation   Follow up:  We will arrange follow up with primary cardiologist.  He will continue on current medical therapy.  The patient advised to continue anticoagulation.  Logan French 10/07/2021, 9:50 AM

## 2021-10-07 NOTE — Interval H&P Note (Signed)
History and Physical Interval Note:  10/07/2021 9:08 AM  Fredrich Birks  has presented today for surgery, with the diagnosis of AFIB.  The various methods of treatment have been discussed with the patient and family. After consideration of risks, benefits and other options for treatment, the patient has consented to  Procedure(s): CARDIOVERSION (N/A) as a surgical intervention.  The patient's history has been reviewed, patient examined, no change in status, stable for surgery.  I have reviewed the patient's chart and labs.  Questions were answered to the patient's satisfaction.     Benaiah Behan

## 2021-10-08 ENCOUNTER — Encounter (HOSPITAL_COMMUNITY): Payer: Self-pay | Admitting: Cardiology

## 2021-10-09 DIAGNOSIS — M25561 Pain in right knee: Secondary | ICD-10-CM | POA: Diagnosis not present

## 2021-10-09 DIAGNOSIS — M1711 Unilateral primary osteoarthritis, right knee: Secondary | ICD-10-CM | POA: Diagnosis not present

## 2021-10-10 ENCOUNTER — Ambulatory Visit (HOSPITAL_COMMUNITY)
Admission: RE | Admit: 2021-10-10 | Discharge: 2021-10-10 | Disposition: A | Discharge status: Home / Self Care | Payer: Medicare Other | Source: Ambulatory Visit | Attending: Physician Assistant | Admitting: Physician Assistant

## 2021-10-10 ENCOUNTER — Other Ambulatory Visit: Payer: Self-pay

## 2021-10-10 VITALS — BP 140/60 | HR 60 | Ht 72.0 in | Wt 215.2 lb

## 2021-10-10 DIAGNOSIS — I10 Essential (primary) hypertension: Secondary | ICD-10-CM | POA: Diagnosis not present

## 2021-10-10 DIAGNOSIS — E785 Hyperlipidemia, unspecified: Secondary | ICD-10-CM | POA: Diagnosis not present

## 2021-10-10 DIAGNOSIS — Z8546 Personal history of malignant neoplasm of prostate: Secondary | ICD-10-CM | POA: Insufficient documentation

## 2021-10-10 DIAGNOSIS — D6869 Other thrombophilia: Secondary | ICD-10-CM | POA: Diagnosis not present

## 2021-10-10 DIAGNOSIS — I4819 Other persistent atrial fibrillation: Secondary | ICD-10-CM | POA: Diagnosis not present

## 2021-10-10 DIAGNOSIS — Z7901 Long term (current) use of anticoagulants: Secondary | ICD-10-CM | POA: Diagnosis not present

## 2021-10-10 DIAGNOSIS — I38 Endocarditis, valve unspecified: Secondary | ICD-10-CM | POA: Insufficient documentation

## 2021-10-10 MED ORDER — METOPROLOL SUCCINATE ER 25 MG PO TB24: 25.0000 mg | ORAL_TABLET | Freq: Every day | ORAL | 3 refills | Status: DC

## 2021-10-10 NOTE — Progress Notes (Signed)
Primary Care Physician: Donnajean Lopes, MD Primary Cardiologist: Dr Claiborne Billings Primary Electrophysiologist: none Referring Physician: Dr Marty Heck is a 87 y.o. male with a history of HLD, HTN, MR, carotid artery disease, prostate cancer, atrial fibrillation who presents for follow up in the [facility].  The patient was initially diagnosed with atrial fibrillation 08/20/21 at a routine visit with Dr Claiborne Billings. Patient was unaware of his arrhythmia. He was started on metoprolol for rate control and Eliquis for a CHADS2VASC score of 4.   On follow up today, patient is s/p DCCV on 10/07/21. He remains in SR today. He states that he feels no different in SR compared to afib. He denies any bleeding issues on anticoagulation.   Today, he denies symptoms of palpitations, chest pain, shortness of breath, orthopnea, PND, lower extremity edema, dizziness, presyncope, syncope, snoring, daytime somnolence, bleeding, or neurologic sequela. The patient is tolerating medications without difficulties and is otherwise without complaint today.    Atrial Fibrillation Risk Factors:  he does not have symptoms or diagnosis of sleep apnea. he does not have a history of rheumatic fever.   he has a BMI of Body mass index is 29.19 kg/m.Marland Kitchen Filed Weights   10/10/21 1031  Weight: 97.6 kg    No family history on file.   Atrial Fibrillation Management history:  Previous antiarrhythmic drugs: none Previous cardioversions: 10/07/21 Previous ablations: none CHADS2VASC score: 4 Anticoagulation history: Eliquis   Past Medical History:  Diagnosis Date   B12 deficiency    Diverticulosis    Fatigue    History of colonic polyps    Hyperlipemia    Hypertension    Pernicious anemia    Prostate cancer ([place])    Syncope    Syncope    Past Surgical History:  Procedure Laterality Date   CARDIOVERSION N/A 10/07/2021   Procedure: CARDIOVERSION;  Surgeon: Berniece Salines, DO;   Location: [facility] ENDOSCOPY;  Service: Cardiovascular;  Laterality: N/A;   IR ANGIO INTRA EXTRACRAN SEL COM CAROTID INNOMINATE BILAT MOD SED  09/10/2017   IR ANGIO VERTEBRAL SEL SUBCLAVIAN INNOMINATE BILAT MOD SED  09/10/2017    Current Outpatient Medications  Medication Sig Dispense Refill   acetaminophen (TYLENOL) 500 MG tablet Take 500 mg by mouth every 6 (six) hours as needed for moderate pain, mild pain or headache.     amLODipine (NORVASC) 5 MG tablet Take 5 mg by mouth daily.      apixaban (ELIQUIS) 5 MG TABS tablet Take 1 tablet (5 mg total) by mouth 2 (two) times daily. 60 tablet 6   ezetimibe (ZETIA) 10 MG tablet Take 10 mg by mouth daily.     finasteride (PROSCAR) 5 MG tablet Take 5 mg by mouth daily.     hydrochlorothiazide (MICROZIDE) 12.5 MG capsule Take 1 capsule (12.5 mg total) by mouth every other day. 90 capsule 3   metoprolol succinate (TOPROL XL) 50 MG 24 hr tablet Take 1 tablet (50 mg total) by mouth daily. 30 tablet 3   rosuvastatin (CRESTOR) 20 MG tablet TAKE ONE TABLET DAILY 90 tablet 3   vitamin B-12 (CYANOCOBALAMIN) 500 MCG tablet Take 500 mcg by mouth daily.      No current facility-administered medications for this encounter.    No Known Allergies  Social History   Socioeconomic History   Marital status: Married    Spouse name: Not on file   Number of children: Not on file   Years of  education: Not on file   Highest education level: Not on file  Occupational History   Not on file  Tobacco Use   Smoking status: Never   Smokeless tobacco: Never   Tobacco comments:    Never smoke 09/23/21  Substance and Sexual Activity   Alcohol use: Yes    Alcohol/week: 1.0 standard drink of alcohol    Types: 1 Cans of beer per week    Comment: occasionally   Drug use: No   Sexual activity: Not on file  Other Topics Concern   Not on file  Social History Narrative   Lives with wife   Right Handed   Drinks 1-2 cups daily   Social Determinants of Health   Financial  Resource Strain: Not on file  Food Insecurity: Not on file  Transportation Needs: Not on file  Physical Activity: Not on file  Stress: Not on file  Social Connections: Not on file  Intimate Partner Violence: Not on file     ROS- All systems are reviewed and negative except as per the HPI above.  Physical Exam: Vitals:   10/10/21 1031  BP: 140/60  Pulse: 60  Weight: 97.6 kg  Height: 6' (1.829 m)     GEN- The patient is a well appearing elderly male, alert and oriented x 3 today.   HEENT-head normocephalic, atraumatic, sclera clear, conjunctiva pink, hearing intact, trachea midline. Lungs- Clear to ausculation bilaterally, normal work of breathing Heart- Regular rate and rhythm, no murmurs, rubs or gallops  GI- soft, NT, ND, + BS Extremities- no clubbing, cyanosis, or edema MS- no significant deformity or atrophy Skin- no rash or lesion Psych- euthymic mood, full affect Neuro- strength and sensation are intact   Wt Readings from Last 3 Encounters:  10/10/21 97.6 kg  10/07/21 96.5 kg  09/23/21 96.5 kg    EKG today demonstrates  SR, 1st degree AV block Vent. rate 60 BPM PR interval 308 ms QRS duration 78 ms QT/QTcB 414/414 ms  Echo 09/08/21 demonstrated   1. Left ventricular ejection fraction, by estimation, is 60 to 65%. The  left ventricle has normal function. The left ventricle has no regional  wall motion abnormalities. There is mild concentric left ventricular  hypertrophy. Left ventricular diastolic parameters are indeterminate.   2. Right ventricular systolic function is normal. The right ventricular  size is normal. There is normal pulmonary artery systolic pressure.   3. Left atrial size was mildly dilated.   4. Right atrial size was mildly dilated.   5. The mitral valve is normal in structure. Mild to moderate mitral valve  regurgitation.   6. The aortic valve is tricuspid. Aortic valve regurgitation is mild.  Aortic valve sclerosis/calcification is  present, without any evidence of  aortic stenosis.   Epic records are reviewed at length today  CHA2DS2-VASc Score = 4  The patient's score is based upon: CHF History: 0 HTN History: 1 Diabetes History: 0 Stroke History: 0 Vascular Disease History: 1 Age Score: 2 Gender Score: 0       ASSESSMENT AND PLAN: 1. Persistent Atrial Fibrillation (ICD10:  I48.19) The patient's CHA2DS2-VASc score is 4, indicating a 4.8% annual risk of stroke.   S/p DCCV 10/07/21 He is in [place] today. We discussed rate vs rhythm control moving forward. Given his advanced age and paucity of symptoms, he would like to pursue a conservative rate control strategy if his afib returns.  Continue Eliquis 5 mg BID Decrease Toprol to 25 mg daily given significantly  prolonged PR interval.   2. Secondary Hypercoagulable State (ICD10:  D68.69) The patient is at significant risk for stroke/thromboembolism based upon his CHA2DS2-VASc Score of 4.  Continue Apixaban (Eliquis).   3. Valvular heart disease Mild-moderate MR Followed by Dr Claiborne Billings.  4. HTN Stable, med changes as above.   Follow up with Dr Claiborne Billings as scheduled. AF clinic as needed.    [facility] [address] [place], [place] [address] [phone] 10/10/2021 10:49 AM

## 2021-11-10 ENCOUNTER — Other Ambulatory Visit: Payer: Self-pay | Admitting: Cardiovascular Disease

## 2021-12-11 ENCOUNTER — Telehealth: Payer: Self-pay | Admitting: Cardiovascular Disease

## 2021-12-11 DIAGNOSIS — R0981 Nasal congestion: Secondary | ICD-10-CM | POA: Diagnosis not present

## 2021-12-11 DIAGNOSIS — U071 COVID-19: Secondary | ICD-10-CM | POA: Diagnosis not present

## 2021-12-11 DIAGNOSIS — Z1152 Encounter for screening for COVID-19: Secondary | ICD-10-CM | POA: Diagnosis not present

## 2021-12-11 DIAGNOSIS — R051 Acute cough: Secondary | ICD-10-CM | POA: Diagnosis not present

## 2021-12-11 DIAGNOSIS — R638 Other symptoms and signs concerning food and fluid intake: Secondary | ICD-10-CM | POA: Diagnosis not present

## 2021-12-11 DIAGNOSIS — R5383 Other fatigue: Secondary | ICD-10-CM | POA: Diagnosis not present

## 2021-12-11 NOTE — Telephone Encounter (Signed)
Patient stated he would like to speak with Dr. Evette Georges nurse.

## 2021-12-11 NOTE — Telephone Encounter (Signed)
Spoke to patient he stated he had to cancel 10/2 appointment with Dr.Kelly.Stated he has a bad cold.He needs to reschedule.Appointment rescheduled with Dr.Kelly 10/20 at 1:00 pm.

## 2021-12-15 ENCOUNTER — Ambulatory Visit: Payer: Medicare Other | Admitting: Cardiovascular Disease

## 2022-01-02 ENCOUNTER — Ambulatory Visit: Payer: Medicare Other | Attending: Cardiovascular Disease | Admitting: Cardiovascular Disease

## 2022-01-02 ENCOUNTER — Encounter: Payer: Self-pay | Admitting: Cardiovascular Disease

## 2022-01-02 VITALS — BP 112/70 | HR 76 | Ht 72.0 in | Wt 215.2 lb

## 2022-01-02 DIAGNOSIS — E785 Hyperlipidemia, unspecified: Secondary | ICD-10-CM | POA: Insufficient documentation

## 2022-01-02 DIAGNOSIS — Z5181 Encounter for therapeutic drug level monitoring: Secondary | ICD-10-CM | POA: Diagnosis not present

## 2022-01-02 DIAGNOSIS — Z7901 Long term (current) use of anticoagulants: Secondary | ICD-10-CM | POA: Diagnosis not present

## 2022-01-02 DIAGNOSIS — I6502 Occlusion and stenosis of left vertebral artery: Secondary | ICD-10-CM | POA: Insufficient documentation

## 2022-01-02 DIAGNOSIS — I6522 Occlusion and stenosis of left carotid artery: Secondary | ICD-10-CM | POA: Insufficient documentation

## 2022-01-02 DIAGNOSIS — I10 Essential (primary) hypertension: Secondary | ICD-10-CM

## 2022-01-02 DIAGNOSIS — I440 Atrioventricular block, first degree: Secondary | ICD-10-CM

## 2022-01-02 DIAGNOSIS — I4819 Other persistent atrial fibrillation: Secondary | ICD-10-CM | POA: Diagnosis not present

## 2022-01-02 MED ORDER — HYDROCHLOROTHIAZIDE 12.5 MG PO CAPS: 12.5000 mg | ORAL_CAPSULE | ORAL | 3 refills | Status: DC

## 2022-01-02 NOTE — Progress Notes (Signed)
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 and Tests Ordered: Current medicines are reviewed at length with the patient today.  Concerns regarding medicines are outlined above.  Medication changes, Labs and Tests ordered today are listed in the Patient Instructions below. Patient Instructions  Medication Instructions:  OK TO TAKE EXTRA HCTZ 12.5MG ON YOUR "OFF" DAYS IS NEEDED FOR SWELLING *If you need a refill on your cardiac medications before your next appointment, please call your pharmacy*  Lab Work: NONE  OTHER APPOINTMENT WITH AFIB CLINIC 02-06-22 AT 9AM-PLEASE ARRIVE EARLY FOR CHECK-IN NOW LOCATED IN [facility]. [place]. USE THE ELEVATOR ON THE LEFT AND GO TO THE 6TH FLOOR TO CHECK IN.   Follow-Up: At [facility], you and your health needs are our priority.  As part of our continuing mission to provide you with exceptional heart care, we have created designated Provider Care Teams.  These Care Teams include your primary Cardiologist (physician) and Advanced Practice Providers (APPs -  Physician Assistants and Nurse Practitioners) who all work  together to provide you with the care you need, when you need it.  Your next appointment:   AFTER AFIB CLINIC   The format for your next appointment:   In Person  Provider:   [name], MD     Important Information About Sugar         Signed, [name], MD  01/02/2022 6:42 PM    [facility] [address] [place], [place], [place]  [address] Phone: [phone]

## 2022-01-02 NOTE — Patient Instructions (Signed)
Medication Instructions:  OK TO TAKE EXTRA HCTZ 12.'5MG'$  ON YOUR "OFF" DAYS IS NEEDED FOR SWELLING *If you need a refill on your cardiac medications before your next appointment, please call your pharmacy*  Lab Work: NONE  OTHER APPOINTMENT WITH AFIB CLINIC 02-06-22 AT 9AM-PLEASE ARRIVE EARLY FOR CHECK-IN NOW LOCATED IN [facility]. [place]. USE THE ELEVATOR ON THE LEFT AND GO TO THE 6TH FLOOR TO CHECK IN.   Follow-Up: At [facility], you and your health needs are our priority.  As part of our continuing mission to provide you with exceptional heart care, we have created designated Provider Care Teams.  These Care Teams include your primary Cardiologist (physician) and Advanced Practice Providers (APPs -  Physician Assistants and Nurse Practitioners) who all work together to provide you with the care you need, when you need it.  Your next appointment:   AFTER AFIB CLINIC   The format for your next appointment:   In Person  Provider:   [name], MD     Important Information About Sugar

## 2022-01-05 ENCOUNTER — Other Ambulatory Visit: Payer: Self-pay | Admitting: Cardiovascular Disease

## 2022-01-06 ENCOUNTER — Ambulatory Visit (HOSPITAL_COMMUNITY)
Admission: RE | Admit: 2022-01-06 | Discharge: 2022-01-06 | Disposition: A | Discharge status: Home / Self Care | Payer: Medicare Other | Source: Ambulatory Visit | Attending: Physician Assistant | Admitting: Physician Assistant

## 2022-01-06 ENCOUNTER — Encounter (HOSPITAL_COMMUNITY): Payer: Self-pay | Admitting: Physician Assistant

## 2022-01-06 VITALS — BP 134/84 | HR 73 | Ht 72.0 in | Wt 215.4 lb

## 2022-01-06 DIAGNOSIS — I38 Endocarditis, valve unspecified: Secondary | ICD-10-CM | POA: Insufficient documentation

## 2022-01-06 DIAGNOSIS — Z8546 Personal history of malignant neoplasm of prostate: Secondary | ICD-10-CM | POA: Insufficient documentation

## 2022-01-06 DIAGNOSIS — D6869 Other thrombophilia: Secondary | ICD-10-CM | POA: Diagnosis not present

## 2022-01-06 DIAGNOSIS — E785 Hyperlipidemia, unspecified: Secondary | ICD-10-CM | POA: Insufficient documentation

## 2022-01-06 DIAGNOSIS — I4819 Other persistent atrial fibrillation: Secondary | ICD-10-CM | POA: Diagnosis not present

## 2022-01-06 DIAGNOSIS — I440 Atrioventricular block, first degree: Secondary | ICD-10-CM | POA: Insufficient documentation

## 2022-01-06 DIAGNOSIS — I10 Essential (primary) hypertension: Secondary | ICD-10-CM | POA: Insufficient documentation

## 2022-01-06 DIAGNOSIS — Z7901 Long term (current) use of anticoagulants: Secondary | ICD-10-CM | POA: Diagnosis not present

## 2022-01-06 NOTE — Progress Notes (Signed)
Primary Care Physician: Donnajean Lopes, MD Primary Cardiologist: Dr Claiborne Billings Primary Electrophysiologist: none Referring Physician: Dr Marty Heck is a 87 y.o. male with a history of HLD, HTN, MR, carotid artery disease, prostate cancer, atrial fibrillation who presents for follow up in the [facility].  The patient was initially diagnosed with atrial fibrillation 08/20/21 at a routine visit with Dr Claiborne Billings. Patient was unaware of his arrhythmia. He was started on metoprolol for rate control and Eliquis for a CHADS2VASC score of 4. Patient is s/p DCCV on 10/07/21. He tested positive for COVID on 12/11/21 and was treated with molnupiravir. On follow up with Dr Claiborne Billings 01/02/22 he was found to be back in afib.  On follow up today, patient reports that he is completely unaware of his afib. He did not feel any different in SR post DCCV. He denies any bleeding issues on anticoagulation.   Today, he denies symptoms of palpitations, chest pain, shortness of breath, orthopnea, PND, lower extremity edema, dizziness, presyncope, syncope, snoring, daytime somnolence, bleeding, or neurologic sequela. The patient is tolerating medications without difficulties and is otherwise without complaint today.    Atrial Fibrillation Risk Factors:  he does not have symptoms or diagnosis of sleep apnea. he does not have a history of rheumatic fever.   he has a BMI of Body mass index is 29.21 kg/m.Marland Kitchen Filed Weights   01/06/22 0907  Weight: 97.7 kg    No family history on file.   Atrial Fibrillation Management history:  Previous antiarrhythmic drugs: none Previous cardioversions: 10/07/21 Previous ablations: none CHADS2VASC score: 4 Anticoagulation history: Eliquis   Past Medical History:  Diagnosis Date   B12 deficiency    Diverticulosis    Fatigue    History of colonic polyps    Hyperlipemia    Hypertension    Pernicious anemia    Prostate cancer ([place])     Syncope    Syncope    Past Surgical History:  Procedure Laterality Date   CARDIOVERSION N/A 10/07/2021   Procedure: CARDIOVERSION;  Surgeon: Berniece Salines, DO;  Location: [facility];  Service: Cardiovascular;  Laterality: N/A;   IR ANGIO INTRA EXTRACRAN SEL COM CAROTID INNOMINATE BILAT MOD SED  09/10/2017   IR ANGIO VERTEBRAL SEL SUBCLAVIAN INNOMINATE BILAT MOD SED  09/10/2017    Current Outpatient Medications  Medication Sig Dispense Refill   acetaminophen (TYLENOL) 500 MG tablet Take 500 mg by mouth every 6 (six) hours as needed for moderate pain, mild pain or headache.     amLODipine (NORVASC) 5 MG tablet Take 5 mg by mouth daily.      apixaban (ELIQUIS) 5 MG TABS tablet Take 1 tablet (5 mg total) by mouth 2 (two) times daily. 60 tablet 6   ezetimibe (ZETIA) 10 MG tablet Take 10 mg by mouth daily.     finasteride (PROSCAR) 5 MG tablet Take 5 mg by mouth daily.     hydrochlorothiazide (MICROZIDE) 12.5 MG capsule TAKE ONE CAPSULE EACH DAY 90 capsule 3   metoprolol succinate (TOPROL XL) 25 MG 24 hr tablet Take 1 tablet (25 mg total) by mouth daily. 30 tablet 3   rosuvastatin (CRESTOR) 20 MG tablet TAKE ONE TABLET DAILY 90 tablet 3   vitamin B-12 (CYANOCOBALAMIN) 500 MCG tablet Take 500 mcg by mouth daily.      No current facility-administered medications for this encounter.    No Known Allergies  Social History   Socioeconomic History  Marital status: Married    Spouse name: Not on file   Number of children: Not on file   Years of education: Not on file   Highest education level: Not on file  Occupational History   Not on file  Tobacco Use   Smoking status: Never   Smokeless tobacco: Never   Tobacco comments:    Never smoke 09/23/21  Substance and Sexual Activity   Alcohol use: Yes    Alcohol/week: 2.0 standard drinks of alcohol    Types: 1 Cans of beer, 1 Shots of liquor per week    Comment: occasionally   Drug use: No   Sexual activity: Not on file  Other Topics  Concern   Not on file  Social History Narrative   Lives with wife   Right Handed   Drinks 1-2 cups daily   Social Determinants of Health   Financial Resource Strain: Not on file  Food Insecurity: Not on file  Transportation Needs: Not on file  Physical Activity: Not on file  Stress: Not on file  Social Connections: Not on file  Intimate Partner Violence: Not on file     ROS- All systems are reviewed and negative except as per the HPI above.  Physical Exam: Vitals:   01/06/22 0907  BP: 134/84  Pulse: 73  Weight: 97.7 kg  Height: 6' (1.829 m)     GEN- The patient is a well appearing elderly male, alert and oriented x 3 today.   HEENT-head normocephalic, atraumatic, sclera clear, conjunctiva pink, hearing intact, trachea midline. Lungs- Clear to ausculation bilaterally, normal work of breathing Heart- irregular rate and rhythm, no murmurs, rubs or gallops  GI- soft, NT, ND, + BS Extremities- no clubbing, cyanosis, or edema MS- no significant deformity or atrophy Skin- no rash or lesion Psych- euthymic mood, full affect Neuro- strength and sensation are intact   Wt Readings from Last 3 Encounters:  01/06/22 97.7 kg  01/02/22 97.6 kg  10/10/21 97.6 kg    EKG today demonstrates  Coarse afib Vent. rate 73 BPM PR interval * ms QRS duration 76 ms QT/QTcB 370/407 ms  Echo 09/08/21 demonstrated   1. Left ventricular ejection fraction, by estimation, is 60 to 65%. The  left ventricle has normal function. The left ventricle has no regional  wall motion abnormalities. There is mild concentric left ventricular  hypertrophy. Left ventricular diastolic parameters are indeterminate.   2. Right ventricular systolic function is normal. The right ventricular  size is normal. There is normal pulmonary artery systolic pressure.   3. Left atrial size was mildly dilated.   4. Right atrial size was mildly dilated.   5. The mitral valve is normal in structure. Mild to moderate  mitral valve  regurgitation.   6. The aortic valve is tricuspid. Aortic valve regurgitation is mild.  Aortic valve sclerosis/calcification is present, without any evidence of  aortic stenosis.   Epic records are reviewed at length today  CHA2DS2-VASc Score = 4  The patient's score is based upon: CHF History: 0 HTN History: 1 Diabetes History: 0 Stroke History: 0 Vascular Disease History: 1 Age Score: 2 Gender Score: 0       ASSESSMENT AND PLAN: 1. Persistent Atrial Fibrillation (ICD10:  I48.19) The patient's CHA2DS2-VASc score is 4, indicating a 4.8% annual risk of stroke.   Patient back in afib, possibly related to recent COVID infection.  We discussed rate vs rhythm control again today. 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 Given his advanced age and paucity of symptoms, he would like to pursue a conservative rate control strategy at this time.  Continue Eliquis 5 mg BID Continue Toprol 25 mg daily   2. Secondary Hypercoagulable State (ICD10:  D68.69) The patient is at significant risk for stroke/thromboembolism based upon his CHA2DS2-VASc Score of 4.  Continue Apixaban (Eliquis).   3. Valvular heart disease Mild-moderate MR Followed by Dr Claiborne Billings.  4. HTN Stable, no changes today.   Follow up in the AF clinic in 3 months to reassess symptoms. Then follow up with Dr Claiborne Billings for routine follow up.    [facility] [address] [place], [place] [address] [phone] 01/06/2022 9:25 AM

## 2022-01-14 DIAGNOSIS — C61 Malignant neoplasm of prostate: Secondary | ICD-10-CM | POA: Diagnosis not present

## 2022-01-21 DIAGNOSIS — R351 Nocturia: Secondary | ICD-10-CM | POA: Diagnosis not present

## 2022-01-21 DIAGNOSIS — C61 Malignant neoplasm of prostate: Secondary | ICD-10-CM | POA: Diagnosis not present

## 2022-01-21 DIAGNOSIS — N401 Enlarged prostate with lower urinary tract symptoms: Secondary | ICD-10-CM | POA: Diagnosis not present

## 2022-02-24 DIAGNOSIS — L820 Inflamed seborrheic keratosis: Secondary | ICD-10-CM | POA: Diagnosis not present

## 2022-02-24 DIAGNOSIS — L821 Other seborrheic keratosis: Secondary | ICD-10-CM | POA: Diagnosis not present

## 2022-02-24 DIAGNOSIS — Z85828 Personal history of other malignant neoplasm of skin: Secondary | ICD-10-CM | POA: Diagnosis not present

## 2022-02-24 DIAGNOSIS — D692 Other nonthrombocytopenic purpura: Secondary | ICD-10-CM | POA: Diagnosis not present

## 2022-02-24 DIAGNOSIS — L812 Freckles: Secondary | ICD-10-CM | POA: Diagnosis not present

## 2022-02-26 DIAGNOSIS — H524 Presbyopia: Secondary | ICD-10-CM | POA: Diagnosis not present

## 2022-02-26 DIAGNOSIS — Z961 Presence of intraocular lens: Secondary | ICD-10-CM | POA: Diagnosis not present

## 2022-02-26 DIAGNOSIS — H52203 Unspecified astigmatism, bilateral: Secondary | ICD-10-CM | POA: Diagnosis not present

## 2022-03-23 ENCOUNTER — Other Ambulatory Visit (HOSPITAL_COMMUNITY): Payer: Self-pay | Admitting: Physician Assistant

## 2022-03-27 DIAGNOSIS — Z23 Encounter for immunization: Secondary | ICD-10-CM | POA: Diagnosis not present

## 2022-04-03 DIAGNOSIS — Z23 Encounter for immunization: Secondary | ICD-10-CM | POA: Diagnosis not present

## 2022-04-22 ENCOUNTER — Encounter (HOSPITAL_COMMUNITY): Payer: Self-pay | Admitting: Physician Assistant

## 2022-04-22 ENCOUNTER — Ambulatory Visit (HOSPITAL_COMMUNITY)
Admission: RE | Admit: 2022-04-22 | Discharge: 2022-04-22 | Disposition: A | Discharge status: Home / Self Care | Payer: Medicare Other | Source: Ambulatory Visit | Attending: Physician Assistant | Admitting: Physician Assistant

## 2022-04-22 VITALS — BP 134/78 | HR 79 | Ht 72.0 in | Wt 221.0 lb

## 2022-04-22 DIAGNOSIS — I4819 Other persistent atrial fibrillation: Secondary | ICD-10-CM | POA: Diagnosis not present

## 2022-04-22 DIAGNOSIS — E785 Hyperlipidemia, unspecified: Secondary | ICD-10-CM | POA: Diagnosis not present

## 2022-04-22 DIAGNOSIS — I484 Atypical atrial flutter: Secondary | ICD-10-CM | POA: Diagnosis not present

## 2022-04-22 DIAGNOSIS — I10 Essential (primary) hypertension: Secondary | ICD-10-CM | POA: Diagnosis not present

## 2022-04-22 DIAGNOSIS — Z79899 Other long term (current) drug therapy: Secondary | ICD-10-CM | POA: Diagnosis not present

## 2022-04-22 DIAGNOSIS — Z8616 Personal history of COVID-19: Secondary | ICD-10-CM | POA: Diagnosis not present

## 2022-04-22 DIAGNOSIS — Z7901 Long term (current) use of anticoagulants: Secondary | ICD-10-CM | POA: Insufficient documentation

## 2022-04-22 DIAGNOSIS — D6869 Other thrombophilia: Secondary | ICD-10-CM | POA: Diagnosis not present

## 2022-04-22 NOTE — Progress Notes (Signed)
Primary Care Physician: Donnajean Lopes, MD Primary Cardiologist: Dr Claiborne Billings Primary Electrophysiologist: none Referring Physician: Dr Marty Heck is a 88 y.o. male with a history of HLD, HTN, MR, carotid artery disease, prostate cancer, atrial fibrillation who presents for follow up in the [facility].  The patient was initially diagnosed with atrial fibrillation 08/20/21 at a routine visit with Dr Claiborne Billings. Patient was unaware of his arrhythmia. He was started on metoprolol for rate control and Eliquis for a CHADS2VASC score of 4. Patient is s/p DCCV on 10/07/21. He tested positive for COVID on 12/11/21 and was treated with molnupiravir. On follow up with Dr Claiborne Billings 01/02/22 he was found to be back in afib. He did not feel any different in SR post DCCV.   On follow up today, patient reports that he has done well since his last visit. He remains asymptomatic with his afib. He is very active playing tennis 3 days weekly. No bleeding issues on anticoagulation.   Today, he denies symptoms of palpitations, chest pain, shortness of breath, orthopnea, PND, lower extremity edema, dizziness, presyncope, syncope, snoring, daytime somnolence, bleeding, or neurologic sequela. The patient is tolerating medications without difficulties and is otherwise without complaint today.    Atrial Fibrillation Risk Factors:  he does not have symptoms or diagnosis of sleep apnea. he does not have a history of rheumatic fever.   he has a BMI of Body mass index is 29.97 kg/m.Marland Kitchen Filed Weights   04/22/22 1000  Weight: 100.2 kg    No family history on file.   Atrial Fibrillation Management history:  Previous antiarrhythmic drugs: none Previous cardioversions: 10/07/21 Previous ablations: none CHADS2VASC score: 4 Anticoagulation history: Eliquis   Past Medical History:  Diagnosis Date   B12 deficiency    Diverticulosis    Fatigue    History of colonic polyps     Hyperlipemia    Hypertension    Pernicious anemia    Prostate cancer ([place])    Syncope    Syncope    Past Surgical History:  Procedure Laterality Date   CARDIOVERSION N/A 10/07/2021   Procedure: CARDIOVERSION;  Surgeon: Berniece Salines, DO;  Location: [facility];  Service: Cardiovascular;  Laterality: N/A;   IR ANGIO INTRA EXTRACRAN SEL COM CAROTID INNOMINATE BILAT MOD SED  09/10/2017   IR ANGIO VERTEBRAL SEL SUBCLAVIAN INNOMINATE BILAT MOD SED  09/10/2017    Current Outpatient Medications  Medication Sig Dispense Refill   acetaminophen (TYLENOL) 500 MG tablet Take 500 mg by mouth every 6 (six) hours as needed for moderate pain, mild pain or headache.     amLODipine (NORVASC) 5 MG tablet Take 5 mg by mouth daily.      apixaban (ELIQUIS) 5 MG TABS tablet Take 1 tablet (5 mg total) by mouth 2 (two) times daily. 60 tablet 6   ezetimibe (ZETIA) 10 MG tablet Take 10 mg by mouth daily.     finasteride (PROSCAR) 5 MG tablet Take 5 mg by mouth daily.     hydrochlorothiazide (MICROZIDE) 12.5 MG capsule TAKE ONE CAPSULE EACH DAY 90 capsule 3   metoprolol succinate (TOPROL-XL) 25 MG 24 hr tablet TAKE ONE TABLET BY MOUTH EVERY DAY 30 tablet 3   rosuvastatin (CRESTOR) 20 MG tablet TAKE ONE TABLET DAILY 90 tablet 3   vitamin B-12 (CYANOCOBALAMIN) 500 MCG tablet Take 500 mcg by mouth daily.      No current facility-administered medications for this encounter.  No Known Allergies  Social History   Socioeconomic History   Marital status: Married    Spouse name: Not on file   Number of children: Not on file   Years of education: Not on file   Highest education level: Not on file  Occupational History   Not on file  Tobacco Use   Smoking status: Never   Smokeless tobacco: Never   Tobacco comments:    Never smoke 09/23/21  Substance and Sexual Activity   Alcohol use: Yes    Alcohol/week: 2.0 standard drinks of alcohol    Types: 1 Cans of beer, 1 Shots of liquor per week    Comment:  occasionally   Drug use: No   Sexual activity: Not on file  Other Topics Concern   Not on file  Social History Narrative   Lives with wife   Right Handed   Drinks 1-2 cups daily   Social Determinants of Health   Financial Resource Strain: Not on file  Food Insecurity: Not on file  Transportation Needs: Not on file  Physical Activity: Not on file  Stress: Not on file  Social Connections: Not on file  Intimate Partner Violence: Not on file     ROS- All systems are reviewed and negative except as per the HPI above.  Physical Exam: Vitals:   04/22/22 1000  BP: 134/78  Pulse: 79  Weight: 100.2 kg  Height: 6' (1.829 m)     GEN- The patient is a well appearing elderly male, alert and oriented x 3 today.   HEENT-head normocephalic, atraumatic, sclera clear, conjunctiva pink, hearing intact, trachea midline. Lungs- Clear to ausculation bilaterally, normal work of breathing Heart- irregular rate and rhythm, no murmurs, rubs or gallops  GI- soft, NT, ND, + BS Extremities- no clubbing, cyanosis, or edema MS- no significant deformity or atrophy Skin- no rash or lesion Psych- euthymic mood, full affect Neuro- strength and sensation are intact   Wt Readings from Last 3 Encounters:  04/22/22 100.2 kg  01/06/22 97.7 kg  01/02/22 97.6 kg    EKG today demonstrates  Atrial flutter with variable block Vent rate 79 bpm QRS 80 ms QTc 438 ms  Echo 09/08/21 demonstrated   1. Left ventricular ejection fraction, by estimation, is 60 to 65%. The  left ventricle has normal function. The left ventricle has no regional  wall motion abnormalities. There is mild concentric left ventricular  hypertrophy. Left ventricular diastolic parameters are indeterminate.   2. Right ventricular systolic function is normal. The right ventricular  size is normal. There is normal pulmonary artery systolic pressure.   3. Left atrial size was mildly dilated.   4. Right atrial size was mildly dilated.    5. The mitral valve is normal in structure. Mild to moderate mitral valve  regurgitation.   6. The aortic valve is tricuspid. Aortic valve regurgitation is mild.  Aortic valve sclerosis/calcification is present, without any evidence of  aortic stenosis.   Epic records are reviewed at length today  CHA2DS2-VASc Score = 4  The patient's score is based upon: CHF History: 0 HTN History: 1 Diabetes History: 0 Stroke History: 0 Vascular Disease History: 1 Age Score: 2 Gender Score: 0       ASSESSMENT AND PLAN: 1. Persistent Atrial Fibrillation/atrial flutter The patient's CHA2DS2-VASc score is 4, indicating a 4.8% annual risk of stroke.   Patient remains in rate controlled atrial flutter, asymptomatic.  He would like to pursue a conservative rate control strategy at  this time.  Continue Eliquis 5 mg BID Continue Toprol 25 mg daily   2. Secondary Hypercoagulable State (ICD10:  D68.69) The patient is at significant risk for stroke/thromboembolism based upon his CHA2DS2-VASc Score of 4.  Continue Apixaban (Eliquis).   3. Valvular heart disease Mild-moderate MR Followed by Dr Claiborne Billings.  4. HTN Stable, no changes today.   Follow up with Dr Claiborne Billings in 3 months. AF clinic as needed.    [facility] [address] [place], [place] [address] [phone] 04/22/2022 10:11 AM

## 2022-04-27 ENCOUNTER — Other Ambulatory Visit: Payer: Self-pay | Admitting: Cardiovascular Disease

## 2022-04-27 NOTE — Telephone Encounter (Signed)
Pt last saw Clint Fenton, PA 04/22/22, last labs 10/07/21 Creat 1.00, age 88, weight 100.2kg, based on specified criteria pt is on appropriate dosage of Eliquis 59m BID for afib.  Will refill rx.

## 2022-07-15 DIAGNOSIS — C61 Malignant neoplasm of prostate: Secondary | ICD-10-CM | POA: Diagnosis not present

## 2022-07-16 ENCOUNTER — Emergency Department (HOSPITAL_COMMUNITY): Payer: Medicare Other

## 2022-07-16 ENCOUNTER — Other Ambulatory Visit: Payer: Self-pay

## 2022-07-16 ENCOUNTER — Emergency Department (HOSPITAL_COMMUNITY)
Admission: EM | Admit: 2022-07-16 | Discharge: 2022-07-16 | Disposition: A | Discharge status: Home / Self Care | Payer: Medicare Other | Attending: Emergency Medicine | Admitting: Emergency Medicine

## 2022-07-16 ENCOUNTER — Encounter (HOSPITAL_COMMUNITY): Payer: Self-pay

## 2022-07-16 DIAGNOSIS — I10 Essential (primary) hypertension: Secondary | ICD-10-CM | POA: Diagnosis not present

## 2022-07-16 DIAGNOSIS — G459 Transient cerebral ischemic attack, unspecified: Secondary | ICD-10-CM

## 2022-07-16 DIAGNOSIS — R4701 Aphasia: Secondary | ICD-10-CM

## 2022-07-16 DIAGNOSIS — I779 Disorder of arteries and arterioles, unspecified: Secondary | ICD-10-CM | POA: Insufficient documentation

## 2022-07-16 DIAGNOSIS — Z7901 Long term (current) use of anticoagulants: Secondary | ICD-10-CM | POA: Diagnosis not present

## 2022-07-16 DIAGNOSIS — R29810 Facial weakness: Secondary | ICD-10-CM | POA: Diagnosis not present

## 2022-07-16 LAB — CBC
HCT: 44.8 % (ref 39.0–52.0)
Hemoglobin: 14.2 g/dL (ref 13.0–17.0)
MCH: 26.8 pg (ref 26.0–34.0)
MCHC: 31.7 g/dL (ref 30.0–36.0)
MCV: 84.5 fL (ref 80.0–100.0)
Platelets: 213 10*3/uL (ref 150–400)
RBC: 5.3 MIL/uL (ref 4.22–5.81)
RDW: 15.8 % — ABNORMAL HIGH (ref 11.5–15.5)
WBC: 8.1 10*3/uL (ref 4.0–10.5)
nRBC: 0 % (ref 0.0–0.2)

## 2022-07-16 LAB — COMPREHENSIVE METABOLIC PANEL
ALT: 21 U/L (ref 0–44)
AST: 30 U/L (ref 15–41)
Albumin: 3.8 g/dL (ref 3.5–5.0)
Alkaline Phosphatase: 49 U/L (ref 38–126)
Anion gap: 10 (ref 5–15)
BUN: 16 mg/dL (ref 8–23)
CO2: 23 mmol/L (ref 22–32)
Calcium: 9 mg/dL (ref 8.9–10.3)
Chloride: 105 mmol/L (ref 98–111)
Creatinine, Ser: 1.14 mg/dL (ref 0.61–1.24)
GFR, Estimated: >60 mL/min (ref >60)
Glucose, Bld: 104 mg/dL — ABNORMAL HIGH (ref 70–99)
Potassium: 3.9 mmol/L (ref 3.5–5.1)
Sodium: 138 mmol/L (ref 135–145)
Total Bilirubin: 1.4 mg/dL — ABNORMAL HIGH (ref 0.3–1.2)
Total Protein: 6.7 g/dL (ref 6.5–8.1)

## 2022-07-16 LAB — DIFFERENTIAL
Abs Immature Granulocytes: 0.03 10*3/uL (ref 0.00–0.07)
Basophils Absolute: 0.1 10*3/uL (ref 0.0–0.1)
Basophils Relative: 1 %
Eosinophils Absolute: 0.2 10*3/uL (ref 0.0–0.5)
Eosinophils Relative: 2 %
Immature Granulocytes: 0 %
Lymphocytes Relative: 27 %
Lymphs Abs: 2.2 10*3/uL (ref 0.7–4.0)
Monocytes Absolute: 0.6 10*3/uL (ref 0.1–1.0)
Monocytes Relative: 8 %
Neutro Abs: 5.1 10*3/uL (ref 1.7–7.7)
Neutrophils Relative %: 62 %

## 2022-07-16 LAB — APTT: aPTT: 34 seconds (ref 24–36)

## 2022-07-16 LAB — RAPID URINE DRUG SCREEN, HOSP PERFORMED
Amphetamines: NOT DETECTED
Barbiturates: NOT DETECTED
Benzodiazepines: NOT DETECTED
Cocaine: NOT DETECTED
Opiates: NOT DETECTED
Tetrahydrocannabinol: NOT DETECTED

## 2022-07-16 LAB — ETHANOL: Alcohol, Ethyl (B): <10 mg/dL (ref <10)

## 2022-07-16 LAB — PROTIME-INR
INR: 1.3 — ABNORMAL HIGH (ref 0.8–1.2)
Prothrombin Time: 16.3 seconds — ABNORMAL HIGH (ref 11.4–15.2)

## 2022-07-16 NOTE — ED Triage Notes (Signed)
Brief episode of aphasia while on the phone with his son this morning around 0930. Also slurred speech. Symptoms are resolved upon arrival to EMS. Patient has known blockages in carotid and a known blockage in right side of head that he follows with neurology for.
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
## 2022-07-16 NOTE — Discharge Instructions (Signed)
You were seen in the emergency department for an episode of slurred speech.  You had blood work and an MRI of your brain that did not show any definite stroke.  This was possibly a TIA.  Please continue your blood thinner and regular medications and follow-up with your neurologist.  Return to the emergency department if any worsening or concerning symptoms.

## 2022-07-22 ENCOUNTER — Other Ambulatory Visit (HOSPITAL_COMMUNITY): Payer: Self-pay | Admitting: Physician Assistant

## 2022-07-22 DIAGNOSIS — C61 Malignant neoplasm of prostate: Secondary | ICD-10-CM | POA: Diagnosis not present

## 2022-07-22 DIAGNOSIS — R351 Nocturia: Secondary | ICD-10-CM | POA: Diagnosis not present

## 2022-07-22 DIAGNOSIS — N401 Enlarged prostate with lower urinary tract symptoms: Secondary | ICD-10-CM | POA: Diagnosis not present

## 2022-07-28 ENCOUNTER — Ambulatory Visit (INDEPENDENT_AMBULATORY_CARE_PROVIDER_SITE_OTHER): Payer: Medicare Other | Admitting: Neurology

## 2022-07-28 ENCOUNTER — Encounter: Payer: Self-pay | Admitting: Neurology

## 2022-07-28 VITALS — BP 125/73 | HR 73 | Ht 72.0 in | Wt 216.8 lb

## 2022-07-28 DIAGNOSIS — I4819 Other persistent atrial fibrillation: Secondary | ICD-10-CM

## 2022-07-28 DIAGNOSIS — R4701 Aphasia: Secondary | ICD-10-CM

## 2022-07-28 DIAGNOSIS — G459 Transient cerebral ischemic attack, unspecified: Secondary | ICD-10-CM | POA: Diagnosis not present

## 2022-07-28 DIAGNOSIS — I6522 Occlusion and stenosis of left carotid artery: Secondary | ICD-10-CM

## 2022-07-28 NOTE — Patient Instructions (Signed)
I had a long d/w patient about his recent episode of transient aphasia, persistent atrial fibrillation, risk for recurrent stroke/TIAs, personally independently reviewed imaging studies and stroke evaluation results and answered questions.Continue Eliquis 5 mg twice daily for secondary stroke prevention and maintain strict control of hypertension with blood pressure goal below 130/90, diabetes with hemoglobin A1c goal below 6.5% and lipids with LDL cholesterol goal below 70 mg/dL. I also advised the patient to eat a healthy diet with plenty of whole grains, cereals, fruits and vegetables, exercise regularly and maintain ideal body weight . Check follow-up sctreening carotid ultrasound and if left ICA stenosis has progressed neck may consider elective revascularization..The patient may also consider possible participation in [place] AF trial (eliquis versus milvexian-factor 11 inhibitor) if interested and will be given information to review followup in the future with me in 1 year or call earlier if needed
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
## 2022-08-04 ENCOUNTER — Ambulatory Visit: Payer: Medicare Other | Admitting: Cardiovascular Disease

## 2022-08-05 ENCOUNTER — Telehealth: Payer: Self-pay | Admitting: Cardiovascular Disease

## 2022-08-05 NOTE — Telephone Encounter (Signed)
New Message:     Patient said he would like for Dr Landry Dyke nurse to please call him. He said he came for an appointment on yesterday(08-04-22). He said he never saw Dr Tresa Endo, because he was running so far behind.

## 2022-08-05 NOTE — Telephone Encounter (Signed)
Patient came to see Dr Tresa Endo.  First visit after learning he has afib.  He came yesterday and states wanted 40 minutes and then was told it would be another hour after this.  He had an incident 3 weeks ago where could not speak for 5 minutes, and went to ED to rule out stroke. He wants to be seen sooner than his next appt. August. Set to see NP next week.  Apologized for not being able to help with the appt.  He states understanding
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
## 2022-08-12 ENCOUNTER — Ambulatory Visit (INDEPENDENT_AMBULATORY_CARE_PROVIDER_SITE_OTHER): Payer: Medicare Other | Admitting: Student

## 2022-08-12 ENCOUNTER — Encounter: Payer: Self-pay | Admitting: Student

## 2022-08-12 VITALS — BP 128/78 | HR 74 | Ht 72.0 in | Wt 219.2 lb

## 2022-08-12 DIAGNOSIS — I6502 Occlusion and stenosis of left vertebral artery: Secondary | ICD-10-CM

## 2022-08-12 DIAGNOSIS — I6523 Occlusion and stenosis of bilateral carotid arteries: Secondary | ICD-10-CM | POA: Diagnosis not present

## 2022-08-12 DIAGNOSIS — I6522 Occlusion and stenosis of left carotid artery: Secondary | ICD-10-CM | POA: Diagnosis not present

## 2022-08-12 DIAGNOSIS — I4819 Other persistent atrial fibrillation: Secondary | ICD-10-CM

## 2022-08-12 NOTE — Patient Instructions (Signed)
Medication Instructions:  Your physician recommends that you continue on your current medications as directed. Please refer to the Current Medication list given to you today.  *If you need a refill on your cardiac medications before your next appointment, please call your pharmacy*   Lab Work: NONE If you have labs (blood work) drawn today and your tests are completely normal, you will receive your results only by: MyChart Message (if you have MyChart) OR A paper copy in the mail If you have any lab test that is abnormal or we need to change your treatment, we will call you to review the results.   Testing/Procedures: NONE   Follow-Up: At [facility], you and your health needs are our priority.  As part of our continuing mission to provide you with exceptional heart care, we have created designated Provider Care Teams.  These Care Teams include your primary Cardiologist (physician) and Advanced Practice Providers (APPs -  Physician Assistants and Nurse Practitioners) who all work together to provide you with the care you need, when you need it.  We recommend signing up for the patient portal called "MyChart".  Sign up information is provided on this After Visit Summary.  MyChart is used to connect with patients for Virtual Visits (Telemedicine).  Patients are able to view lab/test results, encounter notes, upcoming appointments, etc.  Non-urgent messages can be sent to your provider as well.   To learn more about what you can do with MyChart, go to [email or web address].    Your next appointment:   Please keep upcoming appointment.  

## 2022-08-13 ENCOUNTER — Ambulatory Visit (HOSPITAL_COMMUNITY)
Admission: RE | Admit: 2022-08-13 | Discharge: 2022-08-13 | Disposition: A | Discharge status: Home / Self Care | Payer: Medicare Other | Source: Ambulatory Visit | Attending: Neurology | Admitting: Neurology

## 2022-08-13 DIAGNOSIS — I6523 Occlusion and stenosis of bilateral carotid arteries: Secondary | ICD-10-CM | POA: Insufficient documentation

## 2022-08-13 DIAGNOSIS — I4819 Other persistent atrial fibrillation: Secondary | ICD-10-CM | POA: Insufficient documentation

## 2022-08-13 DIAGNOSIS — I6502 Occlusion and stenosis of left vertebral artery: Secondary | ICD-10-CM | POA: Diagnosis not present

## 2022-08-13 DIAGNOSIS — I6522 Occlusion and stenosis of left carotid artery: Secondary | ICD-10-CM | POA: Diagnosis not present

## 2022-08-13 NOTE — Progress Notes (Signed)
Kindly inform patient that carotid doller study shows moderate narrowing of left carotid artery in the neck. Continue medical treatment and follow up for now but this will need follow up study in 6-12 months or earlier if he has symptoms of TIA/stroke

## 2022-08-17 ENCOUNTER — Encounter: Payer: Self-pay | Admitting: Neurology

## 2022-09-24 ENCOUNTER — Other Ambulatory Visit (HOSPITAL_COMMUNITY): Payer: Self-pay

## 2022-09-24 MED ORDER — STUDY - LIBREXIA-AF - APIXABAN 5 MG OR PLACEBO CAPSULE (PI-SETHI): 5.0000 mg | ORAL_CAPSULE | Freq: Two times a day (BID) | ORAL | 0 refills | Status: DC

## 2022-09-24 MED ORDER — STUDY - LIBREXIA-AF - JNJ-70033093 (MILVEXIAN) 100 MG OR PLACEBO (PI-SETHI): 1.0000 | ORAL_TABLET | Freq: Two times a day (BID) | ORAL | 0 refills | Status: DC

## 2022-09-28 DIAGNOSIS — D510 Vitamin B12 deficiency anemia due to intrinsic factor deficiency: Secondary | ICD-10-CM | POA: Diagnosis not present

## 2022-09-28 DIAGNOSIS — R7301 Impaired fasting glucose: Secondary | ICD-10-CM | POA: Diagnosis not present

## 2022-09-28 DIAGNOSIS — C61 Malignant neoplasm of prostate: Secondary | ICD-10-CM | POA: Diagnosis not present

## 2022-09-28 DIAGNOSIS — E785 Hyperlipidemia, unspecified: Secondary | ICD-10-CM | POA: Diagnosis not present

## 2022-09-28 DIAGNOSIS — I10 Essential (primary) hypertension: Secondary | ICD-10-CM | POA: Diagnosis not present

## 2022-10-15 DIAGNOSIS — E785 Hyperlipidemia, unspecified: Secondary | ICD-10-CM | POA: Diagnosis not present

## 2022-10-15 DIAGNOSIS — D510 Vitamin B12 deficiency anemia due to intrinsic factor deficiency: Secondary | ICD-10-CM | POA: Diagnosis not present

## 2022-10-15 DIAGNOSIS — R82998 Other abnormal findings in urine: Secondary | ICD-10-CM | POA: Diagnosis not present

## 2022-10-15 DIAGNOSIS — Z1339 Encounter for screening examination for other mental health and behavioral disorders: Secondary | ICD-10-CM | POA: Diagnosis not present

## 2022-10-15 DIAGNOSIS — M5416 Radiculopathy, lumbar region: Secondary | ICD-10-CM | POA: Diagnosis not present

## 2022-10-15 DIAGNOSIS — I679 Cerebrovascular disease, unspecified: Secondary | ICD-10-CM | POA: Diagnosis not present

## 2022-10-15 DIAGNOSIS — Z1331 Encounter for screening for depression: Secondary | ICD-10-CM | POA: Diagnosis not present

## 2022-10-15 DIAGNOSIS — Z0000 Encounter for general adult medical examination without abnormal findings: Secondary | ICD-10-CM | POA: Diagnosis not present

## 2022-10-15 DIAGNOSIS — I10 Essential (primary) hypertension: Secondary | ICD-10-CM | POA: Diagnosis not present

## 2022-10-15 DIAGNOSIS — C61 Malignant neoplasm of prostate: Secondary | ICD-10-CM | POA: Diagnosis not present

## 2022-11-04 ENCOUNTER — Other Ambulatory Visit: Payer: Self-pay | Admitting: Cardiovascular Disease

## 2022-11-05 NOTE — Progress Notes (Unsigned)
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 ectopy or pauses.  I reviewed his MRI as well as his MRA studies as well as the recommendations of Dr. Pearlean Brownie.  His  cerebral angiogram showed approximately 95% plus preocclusive stenosis of the horizontal petrous segment of the right internal carotid artery.  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  I reviewed the office notes by Dr. Pearlean Brownie.  The consensus is for initial medical therapy recommendations particularly in light of the location of  his carotid stenosis.  I subsequently changed his statin to rosuvastatin.  Subsequent laboratory revealed an LDL of 83 on October 05, 2017 and Zetia was added to rosuvastatin.  At his office visit on August 20, 2021, he was found to be in atrial fibrillation for which he was completely unaware. When he had seen Dr. Pearlean Brownie 1 month previously there was no mention of atrial fibrillation and no ECG was done.  He was started on metoprolol succinate 25 mg daily, aspirin was discontinued and Eliquis was initiated on August 20, 2021.  Unfortunately he developed small right knee bleed leading to significant swelling and after only 48 hours stopped Eliquis.  He ultimately underwent orthopedic evaluation 1 week later with when there was complete resolution of his knee effusion.  At his follow-up office visit on September 10, 2021 he was taking Eliquis 5 mg in the morning and not twice daily.  His ECG confirmed he was still in atrial fibrillation despite initiation of low-dose metoprolol succinate.  At that time I increased metoprolol dose to 50 mg daily and recommended he decrease HCTZ to 12.5 mg every other day instead of daily to avoid blood blood pressure.  Recommended he reinstitute 5 mg twice a day Eliquis.  He was referred to A-fib clinic  and ultimately underwent successful DC cardioversion on October 07, 2021.  Following cardioversion his ECG showed sinus rhythm with prolonged PR interval prolongation at 378 ms.  Subsequently, he has felt well and has been unaware of any rhythm disturbance.  He had significant coughing spell on September 28 and at that time saw his primary physician and tested positive for COVID.  He was successfully treated with molnupiravir.  When last seen by me in October 2023 he was back in atrial fibrillation and when seen subsequently in AF clinic the decision was made for rate control.  In May 2024 he had another brief episode of transient aphasia and subsequently has continued to be evaluated by Dr. Pearlean Brownie.  He is now enrolled in July Librexia AFAF trial comparing Eliquis versus milvexian -factor XI inhibitor.  I reviewed his most recent carotid ultrasound which essentially is unchanged prior and continues to show moderate stenosis of the left carotid artery and severely dampened flow in the left vertebral artery.  It is recommended that he have follow-up study in 6 to 12 months or earlier if he has symptoms of TIA/stroke.  Presently, his AF rate is well-controlled.  His blood pressure is stable.  He does have lower  extremity edema to the lower pretibial region left greater than right.  He has been on amlodipine 5 mg, HCTZ 12.5 mg, metoprolol succinate 25 mg daily.  I have recommended for the next 2 to 3 days he increase HCTZ to 25 mg and then depending upon leg swelling he can try reducing back to 12.5 or consider every other day alternating or if edema persists continue with 25 mg daily.  He continues to be on rosuvastatin 20 mg and Zetia 10 mg daily for hyperlipidemia.  I will see him in 6 months for follow-up evaluation or sooner as needed.   Medication Adjustments/Labs and Tests Ordered: Current medicines are reviewed at length with the patient today.  Concerns regarding medicines are outlined above.  Medication changes, Labs and Tests ordered today are listed in the Patient Instructions below. Patient Instructions  Medication Instructions:  Begin the hydrochlorothiazide 25mg  for edema for the next 3 days. Alternate from 12.5mg  to 25mg  as needed for swelling.  *If you need a refill on your cardiac medications before your next appointment, please call your pharmacy*   Lab Work: If you have labs (blood work) drawn today and your tests are completely normal, you will receive your results only by: MyChart Message (if you have MyChart) OR A paper copy in the mail If you have any lab test that is abnormal or we need to change your treatment, we will call you to review the results.   Follow-Up: At [facility], you and your health needs are our priority.  As part of our continuing mission to provide you with exceptional heart care, we have created designated Provider Care Teams.  These Care Teams include your primary Cardiologist (physician) and Advanced Practice Providers (APPs -  Physician Assistants and Nurse Practitioners) who all work together to provide you with the care you need, when you need it.  We recommend signing up for the patient portal called "MyChart".  Sign up information is  provided on this After Visit Summary.  MyChart is used to connect with patients for Virtual Visits (Telemedicine).  Patients are able to view lab/test results, encounter notes, upcoming appointments, etc.  Non-urgent messages can be sent to your provider as well.   To learn more about what you can  do with MyChart, go to [email or web address].    Your next appointment:   6 month(s) a letter will be mailed out to you as a reminder to call the office for your follow up appointment.  Provider:   Nicki Guadalajara, MD       Signed, Nicki Guadalajara, MD  11/07/2022 11:28 AM    [facility] [address], [place], [place]  [address] Phone: [phone]

## 2022-11-06 ENCOUNTER — Ambulatory Visit: Payer: Medicare Other | Attending: Cardiovascular Disease | Admitting: Cardiovascular Disease

## 2022-11-06 VITALS — BP 120/60 | HR 64 | Ht 72.0 in | Wt 222.2 lb

## 2022-11-06 DIAGNOSIS — I4819 Other persistent atrial fibrillation: Secondary | ICD-10-CM

## 2022-11-06 DIAGNOSIS — I6523 Occlusion and stenosis of bilateral carotid arteries: Secondary | ICD-10-CM | POA: Diagnosis not present

## 2022-11-06 DIAGNOSIS — E785 Hyperlipidemia, unspecified: Secondary | ICD-10-CM

## 2022-11-06 DIAGNOSIS — R600 Localized edema: Secondary | ICD-10-CM

## 2022-11-06 DIAGNOSIS — I6502 Occlusion and stenosis of left vertebral artery: Secondary | ICD-10-CM

## 2022-11-06 DIAGNOSIS — I10 Essential (primary) hypertension: Secondary | ICD-10-CM

## 2022-11-06 DIAGNOSIS — D6869 Other thrombophilia: Secondary | ICD-10-CM | POA: Diagnosis not present

## 2022-11-06 NOTE — Patient Instructions (Addendum)
Medication Instructions:  Begin the hydrochlorothiazide 25mg  for edema for the next 3 days. Alternate from 12.5mg  to 25mg  as needed for swelling.  *If you need a refill on your cardiac medications before your next appointment, please call your pharmacy*   Lab Work: If you have labs (blood work) drawn today and your tests are completely normal, you will receive your results only by: MyChart Message (if you have MyChart) OR A paper copy in the mail If you have any lab test that is abnormal or we need to change your treatment, we will call you to review the results.   Follow-Up: At [facility], you and your health needs are our priority.  As part of our continuing mission to provide you with exceptional heart care, we have created designated Provider Care Teams.  These Care Teams include your primary Cardiologist (physician) and Advanced Practice Providers (APPs -  Physician Assistants and Nurse Practitioners) who all work together to provide you with the care you need, when you need it.  We recommend signing up for the patient portal called "MyChart".  Sign up information is provided on this After Visit Summary.  MyChart is used to connect with patients for Virtual Visits (Telemedicine).  Patients are able to view lab/test results, encounter notes, upcoming appointments, etc.  Non-urgent messages can be sent to your provider as well.   To learn more about what you can do with MyChart, go to [email or web address].    Your next appointment:   6 month(s) a letter will be mailed out to you as a reminder to call the office for your follow up appointment.  Provider:   Nicki Guadalajara, MD

## 2022-11-07 ENCOUNTER — Encounter: Payer: Self-pay | Admitting: Cardiovascular Disease

## 2022-11-13 DIAGNOSIS — M25551 Pain in right hip: Secondary | ICD-10-CM | POA: Diagnosis not present

## 2022-11-13 DIAGNOSIS — M791 Myalgia, unspecified site: Secondary | ICD-10-CM | POA: Diagnosis not present

## 2022-11-14 ENCOUNTER — Other Ambulatory Visit (HOSPITAL_COMMUNITY): Payer: Self-pay | Admitting: Physician Assistant

## 2022-11-17 DIAGNOSIS — L820 Inflamed seborrheic keratosis: Secondary | ICD-10-CM | POA: Diagnosis not present

## 2022-11-17 DIAGNOSIS — L570 Actinic keratosis: Secondary | ICD-10-CM | POA: Diagnosis not present

## 2022-11-17 DIAGNOSIS — L308 Other specified dermatitis: Secondary | ICD-10-CM | POA: Diagnosis not present

## 2022-11-17 DIAGNOSIS — Z85828 Personal history of other malignant neoplasm of skin: Secondary | ICD-10-CM | POA: Diagnosis not present

## 2022-11-17 DIAGNOSIS — L905 Scar conditions and fibrosis of skin: Secondary | ICD-10-CM | POA: Diagnosis not present

## 2022-11-25 DIAGNOSIS — Z23 Encounter for immunization: Secondary | ICD-10-CM | POA: Diagnosis not present

## 2022-12-15 ENCOUNTER — Other Ambulatory Visit: Payer: Self-pay | Admitting: Cardiovascular Disease

## 2022-12-23 DIAGNOSIS — B354 Tinea corporis: Secondary | ICD-10-CM | POA: Diagnosis not present

## 2022-12-23 DIAGNOSIS — B353 Tinea pedis: Secondary | ICD-10-CM | POA: Diagnosis not present

## 2022-12-23 DIAGNOSIS — Z85828 Personal history of other malignant neoplasm of skin: Secondary | ICD-10-CM | POA: Diagnosis not present

## 2022-12-23 DIAGNOSIS — R21 Rash and other nonspecific skin eruption: Secondary | ICD-10-CM | POA: Diagnosis not present

## 2022-12-24 ENCOUNTER — Other Ambulatory Visit (HOSPITAL_COMMUNITY): Payer: Self-pay | Admitting: Neurology

## 2022-12-24 MED ORDER — STUDY - LIBREXIA-AF - APIXABAN 5 MG OR PLACEBO CAPSULE (PI-SETHI): 5.0000 mg | ORAL_CAPSULE | Freq: Two times a day (BID) | ORAL | 0 refills | Status: DC

## 2022-12-24 MED ORDER — STUDY - LIBREXIA-AF - JNJ-70033093 (MILVEXIAN) 100 MG OR PLACEBO (PI-SETHI): 1.0000 | ORAL_TABLET | Freq: Two times a day (BID) | ORAL | 0 refills | Status: DC

## 2023-01-14 DIAGNOSIS — I8311 Varicose veins of right lower extremity with inflammation: Secondary | ICD-10-CM | POA: Diagnosis not present

## 2023-01-14 DIAGNOSIS — L0889 Other specified local infections of the skin and subcutaneous tissue: Secondary | ICD-10-CM | POA: Diagnosis not present

## 2023-01-14 DIAGNOSIS — Z85828 Personal history of other malignant neoplasm of skin: Secondary | ICD-10-CM | POA: Diagnosis not present

## 2023-01-14 DIAGNOSIS — I8312 Varicose veins of left lower extremity with inflammation: Secondary | ICD-10-CM | POA: Diagnosis not present

## 2023-01-14 DIAGNOSIS — I872 Venous insufficiency (chronic) (peripheral): Secondary | ICD-10-CM | POA: Diagnosis not present

## 2023-01-14 DIAGNOSIS — I83213 Varicose veins of right lower extremity with both ulcer of ankle and inflammation: Secondary | ICD-10-CM | POA: Diagnosis not present

## 2023-01-18 DIAGNOSIS — C61 Malignant neoplasm of prostate: Secondary | ICD-10-CM | POA: Diagnosis not present

## 2023-01-25 DIAGNOSIS — C61 Malignant neoplasm of prostate: Secondary | ICD-10-CM | POA: Diagnosis not present

## 2023-01-25 DIAGNOSIS — R3915 Urgency of urination: Secondary | ICD-10-CM | POA: Diagnosis not present

## 2023-01-27 DIAGNOSIS — I8312 Varicose veins of left lower extremity with inflammation: Secondary | ICD-10-CM | POA: Diagnosis not present

## 2023-01-27 DIAGNOSIS — I872 Venous insufficiency (chronic) (peripheral): Secondary | ICD-10-CM | POA: Diagnosis not present

## 2023-01-27 DIAGNOSIS — I83213 Varicose veins of right lower extremity with both ulcer of ankle and inflammation: Secondary | ICD-10-CM | POA: Diagnosis not present

## 2023-01-27 DIAGNOSIS — I8311 Varicose veins of right lower extremity with inflammation: Secondary | ICD-10-CM | POA: Diagnosis not present

## 2023-01-27 DIAGNOSIS — Z85828 Personal history of other malignant neoplasm of skin: Secondary | ICD-10-CM | POA: Diagnosis not present

## 2023-01-27 DIAGNOSIS — L308 Other specified dermatitis: Secondary | ICD-10-CM | POA: Diagnosis not present

## 2023-02-04 DIAGNOSIS — Z85828 Personal history of other malignant neoplasm of skin: Secondary | ICD-10-CM | POA: Diagnosis not present

## 2023-02-04 DIAGNOSIS — I83203 Varicose veins of unspecified lower extremity with both ulcer of ankle and inflammation: Secondary | ICD-10-CM | POA: Diagnosis not present

## 2023-02-04 DIAGNOSIS — L0889 Other specified local infections of the skin and subcutaneous tissue: Secondary | ICD-10-CM | POA: Diagnosis not present

## 2023-02-10 DIAGNOSIS — Z85828 Personal history of other malignant neoplasm of skin: Secondary | ICD-10-CM | POA: Diagnosis not present

## 2023-02-10 DIAGNOSIS — I83213 Varicose veins of right lower extremity with both ulcer of ankle and inflammation: Secondary | ICD-10-CM | POA: Diagnosis not present

## 2023-02-17 DIAGNOSIS — Z85828 Personal history of other malignant neoplasm of skin: Secondary | ICD-10-CM | POA: Diagnosis not present

## 2023-02-17 DIAGNOSIS — L821 Other seborrheic keratosis: Secondary | ICD-10-CM | POA: Diagnosis not present

## 2023-02-17 DIAGNOSIS — I83213 Varicose veins of right lower extremity with both ulcer of ankle and inflammation: Secondary | ICD-10-CM | POA: Diagnosis not present

## 2023-02-19 DIAGNOSIS — Z9181 History of falling: Secondary | ICD-10-CM | POA: Diagnosis not present

## 2023-02-19 DIAGNOSIS — S2232XA Fracture of one rib, left side, initial encounter for closed fracture: Secondary | ICD-10-CM | POA: Diagnosis not present

## 2023-02-19 DIAGNOSIS — I679 Cerebrovascular disease, unspecified: Secondary | ICD-10-CM | POA: Diagnosis not present

## 2023-02-19 DIAGNOSIS — R0781 Pleurodynia: Secondary | ICD-10-CM | POA: Diagnosis not present

## 2023-02-24 DIAGNOSIS — I83203 Varicose veins of unspecified lower extremity with both ulcer of ankle and inflammation: Secondary | ICD-10-CM | POA: Diagnosis not present

## 2023-02-24 DIAGNOSIS — Z85828 Personal history of other malignant neoplasm of skin: Secondary | ICD-10-CM | POA: Diagnosis not present

## 2023-03-03 DIAGNOSIS — Z85828 Personal history of other malignant neoplasm of skin: Secondary | ICD-10-CM | POA: Diagnosis not present

## 2023-03-03 DIAGNOSIS — I83203 Varicose veins of unspecified lower extremity with both ulcer of ankle and inflammation: Secondary | ICD-10-CM | POA: Diagnosis not present

## 2023-03-04 DIAGNOSIS — H524 Presbyopia: Secondary | ICD-10-CM | POA: Diagnosis not present

## 2023-03-04 DIAGNOSIS — H52203 Unspecified astigmatism, bilateral: Secondary | ICD-10-CM | POA: Diagnosis not present

## 2023-03-04 DIAGNOSIS — Z961 Presence of intraocular lens: Secondary | ICD-10-CM | POA: Diagnosis not present

## 2023-03-08 DIAGNOSIS — Z85828 Personal history of other malignant neoplasm of skin: Secondary | ICD-10-CM | POA: Diagnosis not present

## 2023-03-08 DIAGNOSIS — I83203 Varicose veins of unspecified lower extremity with both ulcer of ankle and inflammation: Secondary | ICD-10-CM | POA: Diagnosis not present

## 2023-03-15 DIAGNOSIS — Z85828 Personal history of other malignant neoplasm of skin: Secondary | ICD-10-CM | POA: Diagnosis not present

## 2023-03-15 DIAGNOSIS — I83203 Varicose veins of unspecified lower extremity with both ulcer of ankle and inflammation: Secondary | ICD-10-CM | POA: Diagnosis not present

## 2023-03-23 DIAGNOSIS — Z85828 Personal history of other malignant neoplasm of skin: Secondary | ICD-10-CM | POA: Diagnosis not present

## 2023-03-23 DIAGNOSIS — I83213 Varicose veins of right lower extremity with both ulcer of ankle and inflammation: Secondary | ICD-10-CM | POA: Diagnosis not present

## 2023-03-25 ENCOUNTER — Other Ambulatory Visit (HOSPITAL_COMMUNITY): Payer: Self-pay

## 2023-03-25 MED ORDER — STUDY - LIBREXIA-AF - APIXABAN 5 MG OR PLACEBO CAPSULE (PI-SETHI): 5.0000 mg | ORAL_CAPSULE | Freq: Two times a day (BID) | ORAL | 0 refills | Status: DC

## 2023-03-25 MED ORDER — STUDY - LIBREXIA-AF - JNJ-70033093 (MILVEXIAN) 100 MG OR PLACEBO (PI-SETHI): 1.0000 | ORAL_TABLET | Freq: Two times a day (BID) | ORAL | 0 refills | Status: DC

## 2023-03-30 DIAGNOSIS — I83213 Varicose veins of right lower extremity with both ulcer of ankle and inflammation: Secondary | ICD-10-CM | POA: Diagnosis not present

## 2023-03-30 DIAGNOSIS — Z85828 Personal history of other malignant neoplasm of skin: Secondary | ICD-10-CM | POA: Diagnosis not present

## 2023-04-06 DIAGNOSIS — Z85828 Personal history of other malignant neoplasm of skin: Secondary | ICD-10-CM | POA: Diagnosis not present

## 2023-04-06 DIAGNOSIS — I83213 Varicose veins of right lower extremity with both ulcer of ankle and inflammation: Secondary | ICD-10-CM | POA: Diagnosis not present

## 2023-04-13 ENCOUNTER — Encounter (HOSPITAL_BASED_OUTPATIENT_CLINIC_OR_DEPARTMENT_OTHER): Payer: Medicare Other | Attending: General Surgery | Admitting: General Surgery

## 2023-04-13 DIAGNOSIS — L97812 Non-pressure chronic ulcer of other part of right lower leg with fat layer exposed: Secondary | ICD-10-CM | POA: Insufficient documentation

## 2023-04-13 DIAGNOSIS — R600 Localized edema: Secondary | ICD-10-CM | POA: Insufficient documentation

## 2023-04-13 DIAGNOSIS — I872 Venous insufficiency (chronic) (peripheral): Secondary | ICD-10-CM | POA: Insufficient documentation

## 2023-04-13 DIAGNOSIS — I679 Cerebrovascular disease, unspecified: Secondary | ICD-10-CM | POA: Insufficient documentation

## 2023-04-13 DIAGNOSIS — L97322 Non-pressure chronic ulcer of left ankle with fat layer exposed: Secondary | ICD-10-CM | POA: Diagnosis not present

## 2023-04-20 ENCOUNTER — Encounter (HOSPITAL_BASED_OUTPATIENT_CLINIC_OR_DEPARTMENT_OTHER): Payer: Medicare Other | Attending: General Surgery | Admitting: General Surgery

## 2023-04-20 DIAGNOSIS — L97812 Non-pressure chronic ulcer of other part of right lower leg with fat layer exposed: Secondary | ICD-10-CM | POA: Diagnosis not present

## 2023-04-20 DIAGNOSIS — L97322 Non-pressure chronic ulcer of left ankle with fat layer exposed: Secondary | ICD-10-CM | POA: Insufficient documentation

## 2023-04-20 DIAGNOSIS — L988 Other specified disorders of the skin and subcutaneous tissue: Secondary | ICD-10-CM | POA: Diagnosis not present

## 2023-04-20 DIAGNOSIS — I872 Venous insufficiency (chronic) (peripheral): Secondary | ICD-10-CM | POA: Diagnosis not present

## 2023-04-20 DIAGNOSIS — R600 Localized edema: Secondary | ICD-10-CM | POA: Diagnosis not present

## 2023-04-23 ENCOUNTER — Ambulatory Visit (HOSPITAL_COMMUNITY)
Admission: RE | Admit: 2023-04-23 | Discharge: 2023-04-23 | Disposition: A | Discharge status: Home / Self Care | Payer: Medicare Other | Source: Ambulatory Visit | Attending: Vascular Surgery | Admitting: Vascular Surgery

## 2023-04-23 ENCOUNTER — Other Ambulatory Visit (HOSPITAL_COMMUNITY): Payer: Self-pay | Admitting: General Surgery

## 2023-04-23 DIAGNOSIS — M7989 Other specified soft tissue disorders: Secondary | ICD-10-CM | POA: Diagnosis not present

## 2023-04-23 DIAGNOSIS — S91302A Unspecified open wound, left foot, initial encounter: Secondary | ICD-10-CM | POA: Diagnosis not present

## 2023-04-26 ENCOUNTER — Ambulatory Visit (INDEPENDENT_AMBULATORY_CARE_PROVIDER_SITE_OTHER)
Admission: RE | Admit: 2023-04-26 | Discharge: 2023-04-26 | Disposition: A | Discharge status: Home / Self Care | Payer: Medicare Other | Source: Ambulatory Visit | Attending: Vascular Surgery | Admitting: Vascular Surgery

## 2023-04-26 ENCOUNTER — Other Ambulatory Visit (HOSPITAL_COMMUNITY): Payer: Self-pay | Admitting: General Surgery

## 2023-04-26 ENCOUNTER — Ambulatory Visit (HOSPITAL_COMMUNITY)
Admission: RE | Admit: 2023-04-26 | Discharge: 2023-04-26 | Disposition: A | Discharge status: Home / Self Care | Payer: Medicare Other | Source: Ambulatory Visit | Attending: Vascular Surgery | Admitting: Vascular Surgery

## 2023-04-26 DIAGNOSIS — L97302 Non-pressure chronic ulcer of unspecified ankle with fat layer exposed: Secondary | ICD-10-CM

## 2023-04-26 DIAGNOSIS — S91309A Unspecified open wound, unspecified foot, initial encounter: Secondary | ICD-10-CM

## 2023-04-26 LAB — VAS US ABI WITH/WO TBI
Left ABI: 0.85
Right ABI: 0.73

## 2023-04-27 ENCOUNTER — Encounter (HOSPITAL_BASED_OUTPATIENT_CLINIC_OR_DEPARTMENT_OTHER): Payer: Medicare Other | Admitting: General Surgery

## 2023-04-27 DIAGNOSIS — L988 Other specified disorders of the skin and subcutaneous tissue: Secondary | ICD-10-CM | POA: Diagnosis not present

## 2023-04-27 DIAGNOSIS — R600 Localized edema: Secondary | ICD-10-CM | POA: Diagnosis not present

## 2023-04-27 DIAGNOSIS — L97322 Non-pressure chronic ulcer of left ankle with fat layer exposed: Secondary | ICD-10-CM | POA: Diagnosis not present

## 2023-04-27 DIAGNOSIS — L97812 Non-pressure chronic ulcer of other part of right lower leg with fat layer exposed: Secondary | ICD-10-CM | POA: Diagnosis not present

## 2023-04-27 DIAGNOSIS — I872 Venous insufficiency (chronic) (peripheral): Secondary | ICD-10-CM | POA: Diagnosis not present

## 2023-04-29 NOTE — Progress Notes (Unsigned)
Patient ID: Logan French, male   DOB: 07/02/1934, 89 y.o.   MRN: [id number]  Reason for Consult: No chief complaint on file.   Referred by Duanne Guess, MD  Subjective:     HPI  Logan French is a 89 y.o. male presenting for evaluation of right lower extremity wounds.  He has been undergoing treatment by the wound care center and reports that his wounds have significantly improved. ***  Past Medical History:  Diagnosis Date   B12 deficiency    Diverticulosis    Fatigue    History of colonic polyps    Hyperlipemia    Hypertension    Pernicious anemia    Prostate cancer (HCC)    Syncope    Syncope    No family history on file. Past Surgical History:  Procedure Laterality Date   CARDIOVERSION N/A 10/07/2021   Procedure: CARDIOVERSION;  Surgeon: Thomasene Ripple, DO;  Location: [facility];  Service: Cardiovascular;  Laterality: N/A;   IR ANGIO INTRA EXTRACRAN SEL COM CAROTID INNOMINATE BILAT MOD SED  09/10/2017   IR ANGIO VERTEBRAL SEL SUBCLAVIAN INNOMINATE BILAT MOD SED  09/10/2017    Short Social History:  Social History   Tobacco Use   Smoking status: Never   Smokeless tobacco: Never   Tobacco comments:    Never smoke 09/23/21  Substance Use Topics   Alcohol use: Yes    Alcohol/week: 2.0 standard drinks of alcohol    Types: 1 Cans of beer, 1 Shots of liquor per week    Comment: occasionally    No Known Allergies  Current Outpatient Medications  Medication Sig Dispense Refill   acetaminophen (TYLENOL) 500 MG tablet Take 500 mg by mouth every 6 (six) hours as needed for moderate pain, mild pain or headache.     amLODipine (NORVASC) 5 MG tablet Take 5 mg by mouth daily.      ezetimibe (ZETIA) 10 MG tablet Take 10 mg by mouth daily.     finasteride (PROSCAR) 5 MG tablet Take 5 mg by mouth daily.     hydrochlorothiazide (MICROZIDE) 12.5 MG capsule TAKE ONE CAPSULE EVERY DAY 90 capsule 2   metoprolol succinate (TOPROL-XL) 25 MG 24 hr tablet TAKE ONE  TABLET BY MOUTH EVERY DAY 90 tablet 3   rosuvastatin (CRESTOR) 20 MG tablet TAKE ONE TABLET DAILY 90 tablet 3   STUDY - LIBREXIA-AF - apixaban 5 mg or placebo capsule (PI-Sethi) Take 1 capsule (5 mg total) by mouth 2 (two) times daily. Take at approximately the same time of day with or without food. Please bring bottle back with you to every visit; do not discard bottle. 280 capsule 0   STUDY - LIBREXIA-AF - [id number] (Milvexian) 100 mg or placebo tablet (PI-Sethi) Take 1 tablet by mouth 2 (two) times daily. Take at approximately the same time of day with or without food. Please bring bottle back with you to every visit; do not discard bottle. 280 tablet 0   vitamin B-12 (CYANOCOBALAMIN) 500 MCG tablet Take 500 mcg by mouth daily.      No current facility-administered medications for this visit.    REVIEW OF SYSTEMS  Positive for ***  All other systems were reviewed and are negative     Objective:  Objective   There were no vitals filed for this visit. There is no height or weight on file to calculate BMI.  Physical Exam General: no acute distress Cardiac: hemodynamically stable Pulm: normal work of breathing Abdomen:  non-tender, no pulsatile mass*** Neuro: alert, no focal deficit Extremities: *** Vascular:   Right: ***  Left: ***   Data: ABI +---------+------------------+-----+----------+--------+  Right   Rt Pressure (mmHg)IndexWaveform  Comment   +---------+------------------+-----+----------+--------+  Brachial 143                                        +---------+------------------+-----+----------+--------+  PTA                            absent              +---------+------------------+-----+----------+--------+  DP      105               0.73 monophasic          +---------+------------------+-----+----------+--------+  Great Toe65                0.45                     +---------+------------------+-----+----------+--------+    +---------+------------------+-----+----------+-------+  Left    Lt Pressure (mmHg)IndexWaveform  Comment  +---------+------------------+-----+----------+-------+  Brachial 141                                       +---------+------------------+-----+----------+-------+  PTA     121               0.85 monophasic         +---------+------------------+-----+----------+-------+  DP      121               0.85 monophasic         +---------+------------------+-----+----------+-------+  Great Toe100               0.70                    +---------+------------------+-----+----------+-------+   LE duplex +-----------+--------+-----+--------+----------+--------+  RIGHT     PSV cm/sRatioStenosisWaveform  Comments  +-----------+--------+-----+--------+----------+--------+  CFA Mid    60                   triphasic           +-----------+--------+-----+--------+----------+--------+  DFA       62                   triphasic           +-----------+--------+-----+--------+----------+--------+  SFA Prox   47                   triphasic           +-----------+--------+-----+--------+----------+--------+  SFA Mid    138                  biphasic            +-----------+--------+-----+--------+----------+--------+  SFA Distal 31                   monophasic          +-----------+--------+-----+--------+----------+--------+  POP Prox   13                   monophasic          +-----------+--------+-----+--------+----------+--------+  POP Distal 14  monophasic          +-----------+--------+-----+--------+----------+--------+  ATA Distal 26                   monophasic          +-----------+--------+-----+--------+----------+--------+  PTA Distal              occluded                    +-----------+--------+-----+--------+----------+--------+  PERO Distal18                    monophasic          +-----------+--------+-----+--------+----------+--------+   +-----------+--------+-----+--------+----------+--------+  LEFT      PSV cm/sRatioStenosisWaveform  Comments  +-----------+--------+-----+--------+----------+--------+  CFA Mid    65                   triphasic           +-----------+--------+-----+--------+----------+--------+  DFA       43                   biphasic            +-----------+--------+-----+--------+----------+--------+  SFA Prox   40                   biphasic            +-----------+--------+-----+--------+----------+--------+  SFA Mid    89                   biphasic            +-----------+--------+-----+--------+----------+--------+  SFA Distal 42                   biphasic            +-----------+--------+-----+--------+----------+--------+  POP Prox   68                   biphasic            +-----------+--------+-----+--------+----------+--------+  POP Distal 41                   monophasic          +-----------+--------+-----+--------+----------+--------+  ATA Distal 57                   biphasic            +-----------+--------+-----+--------+----------+--------+  PTA Distal 29                   monophasic          +-----------+--------+-----+--------+----------+--------+  PERO Distal                               NWV       +-----------+--------+-----+--------+----------+--------+   CMP reviewed, creatinine 1.1     Assessment/Plan:     Logan French is a 89 y.o. male with PAD and small slow healing wounds of the right lower extremity. ***   Logan French has atherosclerosis of the native arteries of the {R/L/Bi:18860} lower extremities causing {Chronic PAD levels:25303}. The patient is on best medical therapy for peripheral arterial disease. The patient has been counseled about the risks of tobacco use in atherosclerotic disease. The  patient has been counseled to abstain from any tobacco use. An aortogram with bilateral lower extremity runoff angiography and {  R/L/Bi:18860} lower extremity intervention and is indicated to better evaluate the patient's lower extremity circulation because of the *** limb threatening nature of the patient's diagnosis. Based on the patient's clinical exam and non-invasive data, we anticipate an endovascular intervention in the {angiogram levels:29450::"terminal aortic","iliac","femoropopliteal"} vessels. Stenting and/or athrectomy would be favored because of the improved primary patency of these interventions as compared to plain balloon angioplasty.   Recommendations to optimize cardiovascular risk: Abstinence from all tobacco products. Blood glucose control with goal A1c < 7%. Blood pressure control with goal blood pressure < 140/90 mmHg. Lipid reduction therapy with goal LDL-C <100 mg/dL  Aspirin 81mg  PO QD.  Atorvastatin 40-80mg  PO QD (or other "high intensity" statin therapy).     [name] MD [facility]

## 2023-04-29 NOTE — H&P (View-Only) (Signed)
 Patient ID: Logan French, male   DOB: Feb 19, 1935, 89 y.o.   MRN: [id number]  Reason for Consult: New Patient (Initial Visit)   Referred by Duanne Guess, MD  Subjective:     HPI  Logan French is a 89 y.o. male presenting for evaluation of a right lower extremity wound.  The wound has been present since the summertime and is not healed, he has been undergoing treatment by the wound care center and reports that he thinks it slightly improved.  The ulceration is located as posterior ankle on the right side.  He denies any previous history of claudication, rest pain or previous wounds.  He denies fevers, chills or significant drainage.  Prior to this he is relatively active and plays tennis multiple times per week.  Past Medical History:  Diagnosis Date   A-fib (HCC)    B12 deficiency    Diverticulosis    Fatigue    History of colonic polyps    Hyperlipemia    Hypertension    Pernicious anemia    Prostate cancer (HCC)    Syncope    Syncope    History reviewed. No pertinent family history. Past Surgical History:  Procedure Laterality Date   CARDIOVERSION N/A 10/07/2021   Procedure: CARDIOVERSION;  Surgeon: Thomasene Ripple, DO;  Location: [facility];  Service: Cardiovascular;  Laterality: N/A;   IR ANGIO INTRA EXTRACRAN SEL COM CAROTID INNOMINATE BILAT MOD SED  09/10/2017   IR ANGIO VERTEBRAL SEL SUBCLAVIAN INNOMINATE BILAT MOD SED  09/10/2017    Short Social History:  Social History   Tobacco Use   Smoking status: Never   Smokeless tobacco: Never   Tobacco comments:    Never smoke 09/23/21  Substance Use Topics   Alcohol use: Yes    Alcohol/week: 2.0 standard drinks of alcohol    Types: 1 Cans of beer, 1 Shots of liquor per week    Comment: occasionally    No Known Allergies  Current Outpatient Medications  Medication Sig Dispense Refill   acetaminophen (TYLENOL) 500 MG tablet Take 500 mg by mouth every 6 (six) hours as needed for moderate pain, mild  pain or headache.     amLODipine (NORVASC) 5 MG tablet Take 5 mg by mouth daily.      ezetimibe (ZETIA) 10 MG tablet Take 10 mg by mouth daily.     finasteride (PROSCAR) 5 MG tablet Take 5 mg by mouth daily.     hydrochlorothiazide (MICROZIDE) 12.5 MG capsule TAKE ONE CAPSULE EVERY DAY 90 capsule 2   metoprolol succinate (TOPROL-XL) 25 MG 24 hr tablet TAKE ONE TABLET BY MOUTH EVERY DAY 90 tablet 3   rosuvastatin (CRESTOR) 20 MG tablet TAKE ONE TABLET DAILY 90 tablet 3   STUDY - LIBREXIA-AF - apixaban 5 mg or placebo capsule (PI-Sethi) Take 1 capsule (5 mg total) by mouth 2 (two) times daily. Take at approximately the same time of day with or without food. Please bring bottle back with you to every visit; do not discard bottle. 280 capsule 0   STUDY - LIBREXIA-AF - [id number] (Milvexian) 100 mg or placebo tablet (PI-Sethi) Take 1 tablet by mouth 2 (two) times daily. Take at approximately the same time of day with or without food. Please bring bottle back with you to every visit; do not discard bottle. 280 tablet 0   vitamin B-12 (CYANOCOBALAMIN) 500 MCG tablet Take 500 mcg by mouth daily.      No current facility-administered medications for  this visit.    REVIEW OF SYSTEMS   All other systems were reviewed and are negative     Objective:  Objective   Vitals:   04/30/23 0900  BP: 125/73  Pulse: (!) 57  Resp: 20  Temp: (!) 97.1 F (36.2 C)  TempSrc: Temporal  SpO2: 93%  Weight: 213 lb 1.6 oz (96.7 kg)  Height: 6\' 1"  (1.854 m)   Body mass index is 28.12 kg/m.  Physical Exam General: no acute distress Cardiac: hemodynamically stable Pulm: normal work of breathing Abdomen: non-tender, no pulsatile mass Neuro: alert, no focal deficit Extremities: Shallow abrasion with granulation tissue at base approximately 4 cm x 2 cm on right posterior ankle with fat layer exposed. Vascular:   Right: Palpable femoral, nonpalpable pedal's  Left: Palpable femoral, nonpalpable  pedal's   Data: ABI +---------+------------------+-----+----------+--------+  Right   Rt Pressure (mmHg)IndexWaveform  Comment   +---------+------------------+-----+----------+--------+  Brachial 143                                        +---------+------------------+-----+----------+--------+  PTA                            absent              +---------+------------------+-----+----------+--------+  DP      105               0.73 monophasic          +---------+------------------+-----+----------+--------+  Great Toe65                0.45                     +---------+------------------+-----+----------+--------+   +---------+------------------+-----+----------+-------+  Left    Lt Pressure (mmHg)IndexWaveform  Comment  +---------+------------------+-----+----------+-------+  Brachial 141                                       +---------+------------------+-----+----------+-------+  PTA     121               0.85 monophasic         +---------+------------------+-----+----------+-------+  DP      121               0.85 monophasic         +---------+------------------+-----+----------+-------+  Great Toe100               0.70                    +---------+------------------+-----+----------+-------+   LE duplex +-----------+--------+-----+--------+----------+--------+  RIGHT     PSV cm/sRatioStenosisWaveform  Comments  +-----------+--------+-----+--------+----------+--------+  CFA Mid    60                   triphasic           +-----------+--------+-----+--------+----------+--------+  DFA       62                   triphasic           +-----------+--------+-----+--------+----------+--------+  SFA Prox   47                   triphasic           +-----------+--------+-----+--------+----------+--------+  SFA Mid    138                  biphasic             +-----------+--------+-----+--------+----------+--------+  SFA Distal 31                   monophasic          +-----------+--------+-----+--------+----------+--------+  POP Prox   13                   monophasic          +-----------+--------+-----+--------+----------+--------+  POP Distal 14                   monophasic          +-----------+--------+-----+--------+----------+--------+  ATA Distal 26                   monophasic          +-----------+--------+-----+--------+----------+--------+  PTA Distal              occluded                    +-----------+--------+-----+--------+----------+--------+  PERO Distal18                   monophasic          +-----------+--------+-----+--------+----------+--------+   +-----------+--------+-----+--------+----------+--------+  LEFT      PSV cm/sRatioStenosisWaveform  Comments  +-----------+--------+-----+--------+----------+--------+  CFA Mid    65                   triphasic           +-----------+--------+-----+--------+----------+--------+  DFA       43                   biphasic            +-----------+--------+-----+--------+----------+--------+  SFA Prox   40                   biphasic            +-----------+--------+-----+--------+----------+--------+  SFA Mid    89                   biphasic            +-----------+--------+-----+--------+----------+--------+  SFA Distal 42                   biphasic            +-----------+--------+-----+--------+----------+--------+  POP Prox   68                   biphasic            +-----------+--------+-----+--------+----------+--------+  POP Distal 41                   monophasic          +-----------+--------+-----+--------+----------+--------+  ATA Distal 57                   biphasic            +-----------+--------+-----+--------+----------+--------+  PTA Distal 29                    monophasic          +-----------+--------+-----+--------+----------+--------+  PERO Distal  NWV       +-----------+--------+-----+--------+----------+--------+   CMP reviewed, creatinine 1.1     Assessment/Plan:     Logan French is a 89 y.o. 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  He has been undergoing wound care at the wound care center but has still been slow to heal.  We discussed the risk factors and natural history of PAD.  I explained that although he has been asymptomatic and active previously I explained that since he has now developed a wound, he has marginal pressure for healing and I recommended right lower extremity angiogram with intervention.  Risks and benefits were reviewed, patient expressed understanding and elected to proceed.  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 The patient is on best medical therapy for peripheral arterial disease. The patient has been counseled about the risks of tobacco use in atherosclerotic disease. The patient has been counseled to abstain from any tobacco use. An aortogram with bilateral lower extremity runoff angiography and Right lower extremity intervention and is indicated to better evaluate the patient's lower extremity circulation because of the  limb threatening nature of the patient's diagnosis. Based on the patient's clinical exam and non-invasive data, we anticipate an endovascular intervention in the femoropopliteal and tibial vessels. Stenting and/or athrectomy would be favored because of the improved primary patency of these interventions as compared to plain balloon angioplasty.   Recommendations to optimize cardiovascular risk: Abstinence from all tobacco products. Blood glucose control with goal A1c < 7%. Blood pressure control with  goal blood pressure < 140/90 mmHg. Lipid reduction therapy with goal LDL-C <100 mg/dL  Aspirin 81mg  PO QD.  Atorvastatin 40-80mg  PO QD (or other "high intensity" statin therapy).     [name] MD [facility]

## 2023-04-30 ENCOUNTER — Encounter: Payer: Self-pay | Admitting: Vascular Surgery

## 2023-04-30 ENCOUNTER — Ambulatory Visit (INDEPENDENT_AMBULATORY_CARE_PROVIDER_SITE_OTHER): Payer: Medicare Other | Admitting: Vascular Surgery

## 2023-04-30 ENCOUNTER — Other Ambulatory Visit: Payer: Self-pay

## 2023-04-30 VITALS — BP 125/73 | HR 57 | Temp 97.1°F | Resp 20 | Ht 73.0 in | Wt 213.1 lb

## 2023-04-30 DIAGNOSIS — I70221 Atherosclerosis of native arteries of extremities with rest pain, right leg: Secondary | ICD-10-CM

## 2023-05-04 ENCOUNTER — Encounter (HOSPITAL_BASED_OUTPATIENT_CLINIC_OR_DEPARTMENT_OTHER): Payer: Medicare Other | Admitting: General Surgery

## 2023-05-04 DIAGNOSIS — R600 Localized edema: Secondary | ICD-10-CM | POA: Diagnosis not present

## 2023-05-04 DIAGNOSIS — L988 Other specified disorders of the skin and subcutaneous tissue: Secondary | ICD-10-CM | POA: Diagnosis not present

## 2023-05-04 DIAGNOSIS — I872 Venous insufficiency (chronic) (peripheral): Secondary | ICD-10-CM | POA: Diagnosis not present

## 2023-05-04 DIAGNOSIS — L97322 Non-pressure chronic ulcer of left ankle with fat layer exposed: Secondary | ICD-10-CM | POA: Diagnosis not present

## 2023-05-04 DIAGNOSIS — L97812 Non-pressure chronic ulcer of other part of right lower leg with fat layer exposed: Secondary | ICD-10-CM | POA: Diagnosis not present

## 2023-05-11 ENCOUNTER — Encounter (HOSPITAL_BASED_OUTPATIENT_CLINIC_OR_DEPARTMENT_OTHER): Payer: Medicare Other | Admitting: General Surgery

## 2023-05-11 DIAGNOSIS — L988 Other specified disorders of the skin and subcutaneous tissue: Secondary | ICD-10-CM | POA: Diagnosis not present

## 2023-05-11 DIAGNOSIS — L97322 Non-pressure chronic ulcer of left ankle with fat layer exposed: Secondary | ICD-10-CM | POA: Diagnosis not present

## 2023-05-11 DIAGNOSIS — L97812 Non-pressure chronic ulcer of other part of right lower leg with fat layer exposed: Secondary | ICD-10-CM | POA: Diagnosis not present

## 2023-05-11 DIAGNOSIS — I872 Venous insufficiency (chronic) (peripheral): Secondary | ICD-10-CM | POA: Diagnosis not present

## 2023-05-11 DIAGNOSIS — R600 Localized edema: Secondary | ICD-10-CM | POA: Diagnosis not present

## 2023-05-12 DIAGNOSIS — R0981 Nasal congestion: Secondary | ICD-10-CM | POA: Diagnosis not present

## 2023-05-12 DIAGNOSIS — R5383 Other fatigue: Secondary | ICD-10-CM | POA: Diagnosis not present

## 2023-05-12 DIAGNOSIS — R058 Other specified cough: Secondary | ICD-10-CM | POA: Diagnosis not present

## 2023-05-12 DIAGNOSIS — Z1152 Encounter for screening for COVID-19: Secondary | ICD-10-CM | POA: Diagnosis not present

## 2023-05-17 ENCOUNTER — Encounter (HOSPITAL_COMMUNITY): Payer: Self-pay | Admitting: Vascular Surgery

## 2023-05-17 ENCOUNTER — Other Ambulatory Visit: Payer: Self-pay

## 2023-05-17 ENCOUNTER — Encounter (HOSPITAL_COMMUNITY)
Admission: RE | Disposition: A | Discharge status: Home / Self Care | Payer: Self-pay | Source: Ambulatory Visit | Attending: Vascular Surgery

## 2023-05-17 ENCOUNTER — Ambulatory Visit (HOSPITAL_COMMUNITY)
Admission: RE | Admit: 2023-05-17 | Discharge: 2023-05-17 | Disposition: A | Discharge status: Home / Self Care | Payer: Medicare Other | Source: Ambulatory Visit | Attending: Vascular Surgery | Admitting: Vascular Surgery

## 2023-05-17 DIAGNOSIS — Z7901 Long term (current) use of anticoagulants: Secondary | ICD-10-CM | POA: Diagnosis not present

## 2023-05-17 DIAGNOSIS — I70233 Atherosclerosis of native arteries of right leg with ulceration of ankle: Secondary | ICD-10-CM | POA: Diagnosis not present

## 2023-05-17 DIAGNOSIS — Z79899 Other long term (current) drug therapy: Secondary | ICD-10-CM | POA: Insufficient documentation

## 2023-05-17 DIAGNOSIS — L97319 Non-pressure chronic ulcer of right ankle with unspecified severity: Secondary | ICD-10-CM | POA: Diagnosis not present

## 2023-05-17 DIAGNOSIS — Z7982 Long term (current) use of aspirin: Secondary | ICD-10-CM | POA: Insufficient documentation

## 2023-05-17 DIAGNOSIS — I70221 Atherosclerosis of native arteries of extremities with rest pain, right leg: Secondary | ICD-10-CM

## 2023-05-17 HISTORY — PX: PERIPHERAL INTRAVASCULAR LITHOTRIPSY: CATH118324

## 2023-05-17 HISTORY — PX: ABDOMINAL AORTOGRAM W/LOWER EXTREMITY: CATH118223

## 2023-05-17 HISTORY — PX: PERIPHERAL VASCULAR BALLOON ANGIOPLASTY: CATH118281

## 2023-05-17 LAB — POCT I-STAT, CHEM 8
BUN: 20 mg/dL (ref 8–23)
Calcium, Ion: 1.17 mmol/L (ref 1.15–1.40)
Chloride: 99 mmol/L (ref 98–111)
Creatinine, Ser: 1.1 mg/dL (ref 0.61–1.24)
Glucose, Bld: 119 mg/dL — ABNORMAL HIGH (ref 70–99)
HCT: 45 % (ref 39.0–52.0)
Hemoglobin: 15.3 g/dL (ref 13.0–17.0)
Potassium: 3.5 mmol/L (ref 3.5–5.1)
Sodium: 139 mmol/L (ref 135–145)
TCO2: 27 mmol/L (ref 22–32)

## 2023-05-17 SURGERY — ABDOMINAL AORTOGRAM W/LOWER EXTREMITY
Anesthesia: LOCAL | Laterality: Right

## 2023-05-17 MED ORDER — HEPARIN (PORCINE) IN NACL 1000-0.9 UT/500ML-% IV SOLN
INTRAVENOUS | Status: DC | PRN
  Administered 2023-05-17 (×2): 500 mL

## 2023-05-17 MED ORDER — SODIUM CHLORIDE 0.9% FLUSH: 3.0000 mL | Freq: Two times a day (BID) | INTRAVENOUS | Status: DC

## 2023-05-17 MED ORDER — LIDOCAINE HCL (PF) 1 % IJ SOLN
INTRAMUSCULAR | Status: DC | PRN
  Administered 2023-05-17: 15 mL

## 2023-05-17 MED ORDER — LABETALOL HCL 5 MG/ML IV SOLN: 10.0000 mg | INTRAVENOUS | Status: DC | PRN

## 2023-05-17 MED ORDER — MIDAZOLAM HCL 2 MG/2ML IJ SOLN
INTRAMUSCULAR | Status: DC | PRN
  Administered 2023-05-17: 1 mg via INTRAVENOUS

## 2023-05-17 MED ORDER — SODIUM CHLORIDE 0.9 % IV SOLN: INTRAVENOUS | Status: DC

## 2023-05-17 MED ORDER — IODIXANOL 320 MG/ML IV SOLN
INTRAVENOUS | Status: DC | PRN
  Administered 2023-05-17: 80 mL

## 2023-05-17 MED ORDER — SODIUM CHLORIDE 0.9% FLUSH: 3.0000 mL | INTRAVENOUS | Status: DC | PRN

## 2023-05-17 MED ORDER — MIDAZOLAM HCL 2 MG/2ML IJ SOLN
INTRAMUSCULAR | Status: AC
  Filled 2023-05-17: qty 2

## 2023-05-17 MED ORDER — CLOPIDOGREL BISULFATE 300 MG PO TABS
ORAL_TABLET | ORAL | Status: DC | PRN
  Administered 2023-05-17: 300 mg via ORAL

## 2023-05-17 MED ORDER — FENTANYL CITRATE (PF) 100 MCG/2ML IJ SOLN
INTRAMUSCULAR | Status: DC | PRN
  Administered 2023-05-17: 50 ug via INTRAVENOUS

## 2023-05-17 MED ORDER — HYDRALAZINE HCL 20 MG/ML IJ SOLN: 5.0000 mg | INTRAMUSCULAR | Status: DC | PRN

## 2023-05-17 MED ORDER — HEPARIN SODIUM (PORCINE) 1000 UNIT/ML IJ SOLN
INTRAMUSCULAR | Status: DC | PRN
  Administered 2023-05-17: 8000 [IU] via INTRAVENOUS

## 2023-05-17 MED ORDER — FENTANYL CITRATE (PF) 100 MCG/2ML IJ SOLN
INTRAMUSCULAR | Status: AC
  Filled 2023-05-17: qty 2

## 2023-05-17 MED ORDER — HEPARIN SODIUM (PORCINE) 1000 UNIT/ML IJ SOLN
INTRAMUSCULAR | Status: AC
  Filled 2023-05-17: qty 10

## 2023-05-17 MED ORDER — SODIUM CHLORIDE 0.9 % WEIGHT BASED INFUSION: 1.0000 mL/kg/h | INTRAVENOUS | Status: DC

## 2023-05-17 MED ORDER — CLOPIDOGREL BISULFATE 75 MG PO TABS: 75.0000 mg | ORAL_TABLET | Freq: Every day | ORAL | 0 refills | Status: AC

## 2023-05-17 MED ORDER — LIDOCAINE HCL (PF) 1 % IJ SOLN
INTRAMUSCULAR | Status: AC
  Filled 2023-05-17: qty 30

## 2023-05-17 MED ORDER — ONDANSETRON HCL 4 MG/2ML IJ SOLN: 4.0000 mg | Freq: Four times a day (QID) | INTRAMUSCULAR | Status: DC | PRN

## 2023-05-17 MED ORDER — CLOPIDOGREL BISULFATE 300 MG PO TABS
ORAL_TABLET | ORAL | Status: AC
  Filled 2023-05-17: qty 1

## 2023-05-17 MED ORDER — ASPIRIN 81 MG PO CHEW
CHEWABLE_TABLET | ORAL | Status: AC
  Filled 2023-05-17: qty 1

## 2023-05-17 MED ORDER — ACETAMINOPHEN 325 MG PO TABS: 650.0000 mg | ORAL_TABLET | ORAL | Status: DC | PRN

## 2023-05-17 MED ORDER — ASPIRIN 81 MG PO CHEW
CHEWABLE_TABLET | ORAL | Status: DC | PRN
  Administered 2023-05-17: 81 mg via ORAL

## 2023-05-17 MED ORDER — SODIUM CHLORIDE 0.9 % IV SOLN: 250.0000 mL | INTRAVENOUS | Status: DC | PRN

## 2023-05-17 SURGICAL SUPPLY — 18 items
BAG SNAP BAND KOVER 36X36 (MISCELLANEOUS) IMPLANT
CATH OMNI FLUSH 5F 65CM (CATHETERS) IMPLANT
CATH QUICKCROSS .035X135CM (MICROCATHETER) IMPLANT
CATH SHOCKWAVE E8 6X80 (CATHETERS) IMPLANT
CLOSURE MYNX CONTROL 6F/7F (Vascular Products) IMPLANT
COVER DOME SNAP 22 D (MISCELLANEOUS) IMPLANT
DCB RANGER 6.0X100 135 (BALLOONS) IMPLANT
GLIDEWIRE ADV .035X260CM (WIRE) IMPLANT
KIT ENCORE 26 ADVANTAGE (KITS) IMPLANT
KIT MICROPUNCTURE NIT STIFF (SHEATH) IMPLANT
RANGER DCB 6.0X100 135 (BALLOONS) ×2 IMPLANT
SET ATX-X65L (MISCELLANEOUS) IMPLANT
SHEATH CATAPULT 6F 45 MP (SHEATH) IMPLANT
SHEATH PINNACLE 5F 10CM (SHEATH) IMPLANT
SHEATH PINNACLE 6F 10CM (SHEATH) IMPLANT
TRAY PV CATH (CUSTOM PROCEDURE TRAY) ×2 IMPLANT
WIRE BENTSON .035X145CM (WIRE) IMPLANT
WIRE HI TORQ COMMND ES.014X300 (WIRE) IMPLANT

## 2023-05-17 NOTE — Op Note (Signed)
 Patient name: Logan French MRN: [id number] DOB: 1934/04/02 Sex: male  05/17/2023 Pre-operative Diagnosis: CLTI with a wound and rest pain Post-operative diagnosis:  Same Surgeon:  [name], MD Procedure Performed:  Ultrasound-guided access of left common femoral artery Aortogram and bilateral lower extremity angiogram Third order cannulation of right popliteal artery Intravascular lithotripsy of right above-knee popliteal artery and distal SFA, 6 mm shockwave Drug-coated balloon angioplasty of right popliteal and distal SFA, 6 mm x 100 Ranger Mynx closure of left common femoral artery 57 minutes moderate sedation    Indications: [name] is a 89 year old male with a right ankle wound and breast pain.  He has been undergoing treatment at the wound care center and mildly improved although continued to be slow healing.  Vascular labs demonstrated an ABI of 0.73 with a toe pressure of 65 on the right with monophasic flow in the distal SFA and popliteal.  Risks and benefits of angiogram with intervention were reviewed, patient expressed understanding and elected to proceed.  Findings:  Widely patent aorta and bilateral renal arteries.  Widely patent iliac systems bilaterally  Right common femoral artery and profunda are widely patent.  SFA patent throughout its course although with multifocal calcific stenosis less than 50% throughout.  Severe stenosis of the above-knee popliteal artery approximate 80%.  There is a high takeoff of the AT that is patent to the foot.  The TP trunk has a focal less than 50% stenosis proximally and the peroneal is widely patent to the foot.  The PT is occluded throughout its course.  Left common femoral, profunda and SFA are widely patent.  There is medial calcinosis throughout the SFA.  The above-knee popliteal artery has a significant stenosis approximately 70%.  The peroneal is the only runoff   Procedure:  The patient was identified in the  holding area and taken to the cath lab  The patient was then placed supine on the table and prepped and draped in the usual sterile fashion.  A time out was called.  Ultrasound was used to evaluate the left common femoral artery.  It was patent .  A digital ultrasound image was acquired.  A micropuncture needle was used to access the left common femoral artery under ultrasound guidance.  An 018 wire was advanced without resistance and a micropuncture sheath was placed.  The 018 wire was removed and a benson wire was placed.  The micropuncture sheath was exchanged for a 5 french sheath.  An omniflush catheter was advanced over the wire to the level of L-1.  An abdominal angiogram was obtained.  Next, using the omniflush catheter and a glide advantage wire, the aortic bifurcation was crossed and the catheter was placed into theright external iliac artery and right runoff was obtained. This demonstrated the above findings.  The glide advantage wire was then replaced through the catheter into the right SFA.  The patient was systemically heparinized short 5 French sheath was exchanged for a 6 [place] by 45 cm catapult sheath.  Using the glide advantage and a quick cross catheter the popliteal lesion was crossed and angiogram via the catheter in the below-knee popliteal demonstrated true lumen crossing.  An 014 [place] core was then placed through the quick cross catheter and the lesion was treated with a 6 mm shockwave intravascular lithotripsy balloon for total of 7 treatments above-knee popliteal artery and distal SFA.  A 6 mm x 100 mm Ranger DCB was then used post dilation.  Completion angiography demonstrated wide patency with less than 20% residual stenosis of the treated segments and preserved runoff.  The AT and peroneal.  The 014 wire was ordered and a glide advantage was replaced, the 6 [place] by 45 cm sheath was exchanged for a short 6 French sheath and left runoff was performed via retrograde sheath injections  which demonstrated the above findings.  A minx closure device of the deployed with excellent hemostasis.  Contrast: 80 cc Sedation: 57 minutes  Impression: Maximally revascularized on the right lower extremity with inline flow to the foot via the AT and peroneal arteries.   [name] MD [facility] [place] Office: [phone]

## 2023-05-17 NOTE — Discharge Instructions (Signed)
 Ok to restart Blood thinner (clinical trial drug) on Tuesday 05/18/2023

## 2023-05-17 NOTE — Interval H&P Note (Signed)
 History and Physical Interval Note:  05/17/2023 7:16 AM  Logan French  has presented today for surgery, with the diagnosis of ischmeia right lower extremity I70.221.  The various methods of treatment have been discussed with the patient and family. After consideration of risks, benefits and other options for treatment, the patient has consented to  Procedure(s): ABDOMINAL AORTOGRAM W/LOWER EXTREMITY (N/A) as a surgical intervention.  The patient's history has been reviewed, patient examined, no change in status, stable for surgery.  I have reviewed the patient's chart and labs.  Questions were answered to the patient's satisfaction.     [name]

## 2023-05-18 ENCOUNTER — Encounter (HOSPITAL_BASED_OUTPATIENT_CLINIC_OR_DEPARTMENT_OTHER): Payer: BLUE CROSS/BLUE SHIELD | Attending: General Surgery | Admitting: General Surgery

## 2023-05-18 DIAGNOSIS — I739 Peripheral vascular disease, unspecified: Secondary | ICD-10-CM | POA: Insufficient documentation

## 2023-05-18 DIAGNOSIS — L97812 Non-pressure chronic ulcer of other part of right lower leg with fat layer exposed: Secondary | ICD-10-CM | POA: Diagnosis not present

## 2023-05-18 DIAGNOSIS — L97312 Non-pressure chronic ulcer of right ankle with fat layer exposed: Secondary | ICD-10-CM | POA: Diagnosis not present

## 2023-05-18 DIAGNOSIS — L988 Other specified disorders of the skin and subcutaneous tissue: Secondary | ICD-10-CM | POA: Diagnosis not present

## 2023-05-18 DIAGNOSIS — I872 Venous insufficiency (chronic) (peripheral): Secondary | ICD-10-CM | POA: Diagnosis not present

## 2023-05-18 DIAGNOSIS — R600 Localized edema: Secondary | ICD-10-CM | POA: Diagnosis not present

## 2023-05-25 ENCOUNTER — Encounter (HOSPITAL_BASED_OUTPATIENT_CLINIC_OR_DEPARTMENT_OTHER): Payer: BLUE CROSS/BLUE SHIELD | Admitting: General Surgery

## 2023-05-25 DIAGNOSIS — I739 Peripheral vascular disease, unspecified: Secondary | ICD-10-CM | POA: Diagnosis not present

## 2023-05-25 DIAGNOSIS — L988 Other specified disorders of the skin and subcutaneous tissue: Secondary | ICD-10-CM | POA: Diagnosis not present

## 2023-05-25 DIAGNOSIS — R600 Localized edema: Secondary | ICD-10-CM | POA: Diagnosis not present

## 2023-05-25 DIAGNOSIS — L97312 Non-pressure chronic ulcer of right ankle with fat layer exposed: Secondary | ICD-10-CM | POA: Diagnosis not present

## 2023-05-25 DIAGNOSIS — L97812 Non-pressure chronic ulcer of other part of right lower leg with fat layer exposed: Secondary | ICD-10-CM | POA: Diagnosis not present

## 2023-05-25 DIAGNOSIS — I872 Venous insufficiency (chronic) (peripheral): Secondary | ICD-10-CM | POA: Diagnosis not present

## 2023-06-01 ENCOUNTER — Encounter (HOSPITAL_BASED_OUTPATIENT_CLINIC_OR_DEPARTMENT_OTHER): Admitting: General Surgery

## 2023-06-01 DIAGNOSIS — I872 Venous insufficiency (chronic) (peripheral): Secondary | ICD-10-CM | POA: Diagnosis not present

## 2023-06-01 DIAGNOSIS — L97312 Non-pressure chronic ulcer of right ankle with fat layer exposed: Secondary | ICD-10-CM | POA: Diagnosis not present

## 2023-06-01 DIAGNOSIS — L97812 Non-pressure chronic ulcer of other part of right lower leg with fat layer exposed: Secondary | ICD-10-CM | POA: Diagnosis not present

## 2023-06-01 DIAGNOSIS — L988 Other specified disorders of the skin and subcutaneous tissue: Secondary | ICD-10-CM | POA: Diagnosis not present

## 2023-06-01 DIAGNOSIS — R600 Localized edema: Secondary | ICD-10-CM | POA: Diagnosis not present

## 2023-06-01 DIAGNOSIS — I739 Peripheral vascular disease, unspecified: Secondary | ICD-10-CM | POA: Diagnosis not present

## 2023-06-08 ENCOUNTER — Encounter (HOSPITAL_BASED_OUTPATIENT_CLINIC_OR_DEPARTMENT_OTHER): Admitting: General Surgery

## 2023-06-08 DIAGNOSIS — L988 Other specified disorders of the skin and subcutaneous tissue: Secondary | ICD-10-CM | POA: Diagnosis not present

## 2023-06-08 DIAGNOSIS — L97312 Non-pressure chronic ulcer of right ankle with fat layer exposed: Secondary | ICD-10-CM | POA: Diagnosis not present

## 2023-06-08 DIAGNOSIS — I872 Venous insufficiency (chronic) (peripheral): Secondary | ICD-10-CM | POA: Diagnosis not present

## 2023-06-08 DIAGNOSIS — R600 Localized edema: Secondary | ICD-10-CM | POA: Diagnosis not present

## 2023-06-08 DIAGNOSIS — I739 Peripheral vascular disease, unspecified: Secondary | ICD-10-CM | POA: Diagnosis not present

## 2023-06-15 ENCOUNTER — Encounter (HOSPITAL_BASED_OUTPATIENT_CLINIC_OR_DEPARTMENT_OTHER): Attending: General Surgery | Admitting: General Surgery

## 2023-06-15 DIAGNOSIS — I739 Peripheral vascular disease, unspecified: Secondary | ICD-10-CM | POA: Insufficient documentation

## 2023-06-15 DIAGNOSIS — L97312 Non-pressure chronic ulcer of right ankle with fat layer exposed: Secondary | ICD-10-CM | POA: Insufficient documentation

## 2023-06-15 DIAGNOSIS — I872 Venous insufficiency (chronic) (peripheral): Secondary | ICD-10-CM | POA: Diagnosis not present

## 2023-06-15 DIAGNOSIS — R600 Localized edema: Secondary | ICD-10-CM | POA: Insufficient documentation

## 2023-06-15 DIAGNOSIS — L989 Disorder of the skin and subcutaneous tissue, unspecified: Secondary | ICD-10-CM | POA: Diagnosis not present

## 2023-06-17 ENCOUNTER — Inpatient Hospital Stay (HOSPITAL_COMMUNITY)

## 2023-06-17 ENCOUNTER — Emergency Department (HOSPITAL_BASED_OUTPATIENT_CLINIC_OR_DEPARTMENT_OTHER)

## 2023-06-17 ENCOUNTER — Encounter (HOSPITAL_BASED_OUTPATIENT_CLINIC_OR_DEPARTMENT_OTHER): Payer: Self-pay

## 2023-06-17 ENCOUNTER — Other Ambulatory Visit: Payer: Self-pay

## 2023-06-17 ENCOUNTER — Inpatient Hospital Stay (HOSPITAL_BASED_OUTPATIENT_CLINIC_OR_DEPARTMENT_OTHER)
Admission: EM | Admit: 2023-06-17 | Discharge: 2023-06-25 | DRG: 871 | Disposition: A | Discharge status: Rehabilitation Facility | Attending: Internal Medicine | Admitting: Internal Medicine

## 2023-06-17 DIAGNOSIS — A400 Sepsis due to streptococcus, group A: Principal | ICD-10-CM | POA: Diagnosis present

## 2023-06-17 DIAGNOSIS — I517 Cardiomegaly: Secondary | ICD-10-CM | POA: Diagnosis not present

## 2023-06-17 DIAGNOSIS — N179 Acute kidney failure, unspecified: Secondary | ICD-10-CM | POA: Diagnosis present

## 2023-06-17 DIAGNOSIS — L538 Other specified erythematous conditions: Secondary | ICD-10-CM | POA: Diagnosis not present

## 2023-06-17 DIAGNOSIS — I4891 Unspecified atrial fibrillation: Secondary | ICD-10-CM | POA: Diagnosis not present

## 2023-06-17 DIAGNOSIS — Z860100 Personal history of colon polyps, unspecified: Secondary | ICD-10-CM | POA: Diagnosis not present

## 2023-06-17 DIAGNOSIS — L97311 Non-pressure chronic ulcer of right ankle limited to breakdown of skin: Secondary | ICD-10-CM

## 2023-06-17 DIAGNOSIS — E43 Unspecified severe protein-calorie malnutrition: Secondary | ICD-10-CM | POA: Diagnosis present

## 2023-06-17 DIAGNOSIS — M79604 Pain in right leg: Secondary | ICD-10-CM | POA: Diagnosis not present

## 2023-06-17 DIAGNOSIS — W5503XA Scratched by cat, initial encounter: Secondary | ICD-10-CM | POA: Diagnosis not present

## 2023-06-17 DIAGNOSIS — N400 Enlarged prostate without lower urinary tract symptoms: Secondary | ICD-10-CM | POA: Diagnosis present

## 2023-06-17 DIAGNOSIS — E1151 Type 2 diabetes mellitus with diabetic peripheral angiopathy without gangrene: Secondary | ICD-10-CM | POA: Diagnosis present

## 2023-06-17 DIAGNOSIS — Z8546 Personal history of malignant neoplasm of prostate: Secondary | ICD-10-CM | POA: Diagnosis not present

## 2023-06-17 DIAGNOSIS — Z9862 Peripheral vascular angioplasty status: Secondary | ICD-10-CM | POA: Diagnosis not present

## 2023-06-17 DIAGNOSIS — Z7901 Long term (current) use of anticoagulants: Secondary | ICD-10-CM

## 2023-06-17 DIAGNOSIS — Z6832 Body mass index (BMI) 32.0-32.9, adult: Secondary | ICD-10-CM | POA: Diagnosis not present

## 2023-06-17 DIAGNOSIS — I70291 Other atherosclerosis of native arteries of extremities, right leg: Secondary | ICD-10-CM | POA: Diagnosis not present

## 2023-06-17 DIAGNOSIS — B955 Unspecified streptococcus as the cause of diseases classified elsewhere: Secondary | ICD-10-CM | POA: Diagnosis not present

## 2023-06-17 DIAGNOSIS — Z9861 Coronary angioplasty status: Secondary | ICD-10-CM | POA: Diagnosis not present

## 2023-06-17 DIAGNOSIS — D649 Anemia, unspecified: Secondary | ICD-10-CM | POA: Diagnosis present

## 2023-06-17 DIAGNOSIS — K5900 Constipation, unspecified: Secondary | ICD-10-CM | POA: Diagnosis present

## 2023-06-17 DIAGNOSIS — K5901 Slow transit constipation: Secondary | ICD-10-CM | POA: Diagnosis not present

## 2023-06-17 DIAGNOSIS — M170 Bilateral primary osteoarthritis of knee: Secondary | ICD-10-CM | POA: Diagnosis not present

## 2023-06-17 DIAGNOSIS — S86001A Unspecified injury of right Achilles tendon, initial encounter: Secondary | ICD-10-CM | POA: Diagnosis not present

## 2023-06-17 DIAGNOSIS — R32 Unspecified urinary incontinence: Secondary | ICD-10-CM | POA: Diagnosis not present

## 2023-06-17 DIAGNOSIS — M25562 Pain in left knee: Secondary | ICD-10-CM | POA: Diagnosis not present

## 2023-06-17 DIAGNOSIS — N289 Disorder of kidney and ureter, unspecified: Secondary | ICD-10-CM | POA: Diagnosis not present

## 2023-06-17 DIAGNOSIS — R601 Generalized edema: Secondary | ICD-10-CM | POA: Diagnosis not present

## 2023-06-17 DIAGNOSIS — E11621 Type 2 diabetes mellitus with foot ulcer: Secondary | ICD-10-CM | POA: Diagnosis present

## 2023-06-17 DIAGNOSIS — A419 Sepsis, unspecified organism: Secondary | ICD-10-CM

## 2023-06-17 DIAGNOSIS — I4819 Other persistent atrial fibrillation: Secondary | ICD-10-CM | POA: Diagnosis present

## 2023-06-17 DIAGNOSIS — R600 Localized edema: Secondary | ICD-10-CM | POA: Diagnosis not present

## 2023-06-17 DIAGNOSIS — E785 Hyperlipidemia, unspecified: Secondary | ICD-10-CM | POA: Diagnosis present

## 2023-06-17 DIAGNOSIS — Z9889 Other specified postprocedural states: Secondary | ICD-10-CM | POA: Diagnosis not present

## 2023-06-17 DIAGNOSIS — L03119 Cellulitis of unspecified part of limb: Secondary | ICD-10-CM | POA: Diagnosis not present

## 2023-06-17 DIAGNOSIS — L97419 Non-pressure chronic ulcer of right heel and midfoot with unspecified severity: Secondary | ICD-10-CM | POA: Diagnosis not present

## 2023-06-17 DIAGNOSIS — Z7902 Long term (current) use of antithrombotics/antiplatelets: Secondary | ICD-10-CM | POA: Diagnosis not present

## 2023-06-17 DIAGNOSIS — R159 Full incontinence of feces: Secondary | ICD-10-CM | POA: Diagnosis not present

## 2023-06-17 DIAGNOSIS — E871 Hypo-osmolality and hyponatremia: Secondary | ICD-10-CM | POA: Diagnosis not present

## 2023-06-17 DIAGNOSIS — Z794 Long term (current) use of insulin: Secondary | ICD-10-CM

## 2023-06-17 DIAGNOSIS — R1310 Dysphagia, unspecified: Secondary | ICD-10-CM | POA: Diagnosis present

## 2023-06-17 DIAGNOSIS — R7989 Other specified abnormal findings of blood chemistry: Secondary | ICD-10-CM | POA: Diagnosis not present

## 2023-06-17 DIAGNOSIS — M7989 Other specified soft tissue disorders: Secondary | ICD-10-CM | POA: Diagnosis not present

## 2023-06-17 DIAGNOSIS — I739 Peripheral vascular disease, unspecified: Secondary | ICD-10-CM | POA: Diagnosis not present

## 2023-06-17 DIAGNOSIS — R739 Hyperglycemia, unspecified: Secondary | ICD-10-CM

## 2023-06-17 DIAGNOSIS — L03115 Cellulitis of right lower limb: Principal | ICD-10-CM | POA: Diagnosis present

## 2023-06-17 DIAGNOSIS — I10 Essential (primary) hypertension: Secondary | ICD-10-CM | POA: Diagnosis present

## 2023-06-17 DIAGNOSIS — E559 Vitamin D deficiency, unspecified: Secondary | ICD-10-CM | POA: Diagnosis not present

## 2023-06-17 DIAGNOSIS — R5381 Other malaise: Secondary | ICD-10-CM | POA: Diagnosis not present

## 2023-06-17 DIAGNOSIS — S81801A Unspecified open wound, right lower leg, initial encounter: Secondary | ICD-10-CM | POA: Diagnosis not present

## 2023-06-17 DIAGNOSIS — R0602 Shortness of breath: Secondary | ICD-10-CM | POA: Diagnosis not present

## 2023-06-17 DIAGNOSIS — I7090 Unspecified atherosclerosis: Secondary | ICD-10-CM | POA: Diagnosis not present

## 2023-06-17 DIAGNOSIS — M11261 Other chondrocalcinosis, right knee: Secondary | ICD-10-CM | POA: Diagnosis not present

## 2023-06-17 DIAGNOSIS — I700 Atherosclerosis of aorta: Secondary | ICD-10-CM | POA: Diagnosis not present

## 2023-06-17 DIAGNOSIS — R066 Hiccough: Secondary | ICD-10-CM | POA: Diagnosis not present

## 2023-06-17 DIAGNOSIS — J9811 Atelectasis: Secondary | ICD-10-CM | POA: Diagnosis not present

## 2023-06-17 DIAGNOSIS — Y929 Unspecified place or not applicable: Secondary | ICD-10-CM | POA: Diagnosis not present

## 2023-06-17 DIAGNOSIS — Z79899 Other long term (current) drug therapy: Secondary | ICD-10-CM | POA: Diagnosis not present

## 2023-06-17 DIAGNOSIS — E872 Acidosis, unspecified: Secondary | ICD-10-CM | POA: Diagnosis present

## 2023-06-17 DIAGNOSIS — E222 Syndrome of inappropriate secretion of antidiuretic hormone: Secondary | ICD-10-CM | POA: Diagnosis not present

## 2023-06-17 DIAGNOSIS — R7881 Bacteremia: Secondary | ICD-10-CM | POA: Diagnosis not present

## 2023-06-17 DIAGNOSIS — M1711 Unilateral primary osteoarthritis, right knee: Secondary | ICD-10-CM | POA: Diagnosis not present

## 2023-06-17 DIAGNOSIS — D510 Vitamin B12 deficiency anemia due to intrinsic factor deficiency: Secondary | ICD-10-CM | POA: Diagnosis not present

## 2023-06-17 DIAGNOSIS — B950 Streptococcus, group A, as the cause of diseases classified elsewhere: Secondary | ICD-10-CM | POA: Diagnosis not present

## 2023-06-17 LAB — URINALYSIS, W/ REFLEX TO CULTURE (INFECTION SUSPECTED)
Bacteria, UA: NONE SEEN
Bilirubin Urine: NEGATIVE
Glucose, UA: NEGATIVE mg/dL
Ketones, ur: NEGATIVE mg/dL
Leukocytes,Ua: NEGATIVE
Nitrite: NEGATIVE
Protein, ur: 30 mg/dL — AB
Specific Gravity, Urine: 1.023 (ref 1.005–1.030)
pH: 5.5 (ref 5.0–8.0)

## 2023-06-17 LAB — COMPREHENSIVE METABOLIC PANEL WITH GFR
ALT: 17 U/L (ref 0–44)
AST: 29 U/L (ref 15–41)
Albumin: 3.4 g/dL — ABNORMAL LOW (ref 3.5–5.0)
Alkaline Phosphatase: 56 U/L (ref 38–126)
Anion gap: 12 (ref 5–15)
BUN: 26 mg/dL — ABNORMAL HIGH (ref 8–23)
CO2: 22 mmol/L (ref 22–32)
Calcium: 8.6 mg/dL — ABNORMAL LOW (ref 8.9–10.3)
Chloride: 100 mmol/L (ref 98–111)
Creatinine, Ser: 1.42 mg/dL — ABNORMAL HIGH (ref 0.61–1.24)
GFR, Estimated: 48 mL/min — ABNORMAL LOW (ref >60)
Glucose, Bld: 134 mg/dL — ABNORMAL HIGH (ref 70–99)
Potassium: 3.7 mmol/L (ref 3.5–5.1)
Sodium: 134 mmol/L — ABNORMAL LOW (ref 135–145)
Total Bilirubin: 1.3 mg/dL — ABNORMAL HIGH (ref 0.0–1.2)
Total Protein: 6.1 g/dL — ABNORMAL LOW (ref 6.5–8.1)

## 2023-06-17 LAB — CBC WITH DIFFERENTIAL/PLATELET
Abs Immature Granulocytes: 0.34 10*3/uL — ABNORMAL HIGH (ref 0.00–0.07)
Basophils Absolute: 0 10*3/uL (ref 0.0–0.1)
Basophils Relative: 0 %
Eosinophils Absolute: 0 10*3/uL (ref 0.0–0.5)
Eosinophils Relative: 0 %
HCT: 37 % — ABNORMAL LOW (ref 39.0–52.0)
Hemoglobin: 12.3 g/dL — ABNORMAL LOW (ref 13.0–17.0)
Immature Granulocytes: 2 %
Lymphocytes Relative: 6 %
Lymphs Abs: 1.2 10*3/uL (ref 0.7–4.0)
MCH: 28.5 pg (ref 26.0–34.0)
MCHC: 33.2 g/dL (ref 30.0–36.0)
MCV: 85.8 fL (ref 80.0–100.0)
Monocytes Absolute: 1.2 10*3/uL — ABNORMAL HIGH (ref 0.1–1.0)
Monocytes Relative: 7 %
Neutro Abs: 16.2 10*3/uL — ABNORMAL HIGH (ref 1.7–7.7)
Neutrophils Relative %: 85 %
Platelets: 194 10*3/uL (ref 150–400)
RBC: 4.31 MIL/uL (ref 4.22–5.81)
RDW: 15.4 % (ref 11.5–15.5)
WBC: 18.9 10*3/uL — ABNORMAL HIGH (ref 4.0–10.5)
nRBC: 0 % (ref 0.0–0.2)

## 2023-06-17 LAB — LACTIC ACID, PLASMA
Lactic Acid, Venous: 2.8 mmol/L (ref 0.5–1.9)
Lactic Acid, Venous: 2.4 mmol/L (ref 0.5–1.9)

## 2023-06-17 LAB — PROTIME-INR
INR: 2 — ABNORMAL HIGH (ref 0.8–1.2)
Prothrombin Time: 22.8 s — ABNORMAL HIGH (ref 11.4–15.2)

## 2023-06-17 LAB — HEMOGLOBIN A1C
Hgb A1c MFr Bld: 6.4 % — ABNORMAL HIGH (ref 4.8–5.6)
Mean Plasma Glucose: 136.98 mg/dL

## 2023-06-17 LAB — GLUCOSE, CAPILLARY: Glucose-Capillary: 138 mg/dL — ABNORMAL HIGH (ref 70–99)

## 2023-06-17 LAB — APTT: aPTT: 38 s — ABNORMAL HIGH (ref 24–36)

## 2023-06-17 MED ORDER — LACTATED RINGERS IV BOLUS (SEPSIS)
1000.0000 mL | Freq: Once | INTRAVENOUS | Status: AC
  Administered 2023-06-17: 1000 mL via INTRAVENOUS

## 2023-06-17 MED ORDER — SODIUM CHLORIDE 0.9 % IV SOLN: 2.0000 g | INTRAVENOUS | Status: DC

## 2023-06-17 MED ORDER — ONDANSETRON HCL 4 MG/2ML IJ SOLN
4.0000 mg | Freq: Four times a day (QID) | INTRAMUSCULAR | Status: DC | PRN
Start: 2023-06-17 — End: 2023-06-25

## 2023-06-17 MED ORDER — METRONIDAZOLE 500 MG/100ML IV SOLN: 500.0000 mg | Freq: Two times a day (BID) | INTRAVENOUS | Status: DC

## 2023-06-17 MED ORDER — SODIUM CHLORIDE 0.9 % IV SOLN
2.0000 g | Freq: Once | INTRAVENOUS | Status: AC
  Administered 2023-06-17: 2 g via INTRAVENOUS
  Filled 2023-06-17: qty 20

## 2023-06-17 MED ORDER — ROSUVASTATIN CALCIUM 20 MG PO TABS
20.0000 mg | ORAL_TABLET | Freq: Every day | ORAL | Status: DC
  Administered 2023-06-17 – 2023-06-25 (×9): 20 mg via ORAL
  Filled 2023-06-17 (×9): qty 1

## 2023-06-17 MED ORDER — ACETAMINOPHEN 650 MG RE SUPP: 650.0000 mg | Freq: Four times a day (QID) | RECTAL | Status: DC | PRN

## 2023-06-17 MED ORDER — ACETAMINOPHEN 325 MG PO TABS
650.0000 mg | ORAL_TABLET | Freq: Once | ORAL | Status: AC
  Administered 2023-06-17: 650 mg via ORAL
  Filled 2023-06-17: qty 2

## 2023-06-17 MED ORDER — STUDY - LIBREXIA-AF - APIXABAN 5 MG OR PLACEBO CAPSULE (PI-SETHI): 5.0000 mg | ORAL_CAPSULE | Freq: Two times a day (BID) | ORAL | Status: DC

## 2023-06-17 MED ORDER — FINASTERIDE 5 MG PO TABS
5.0000 mg | ORAL_TABLET | Freq: Every day | ORAL | Status: DC
Start: 2023-06-17 — End: 2023-06-17

## 2023-06-17 MED ORDER — ACETAMINOPHEN 325 MG PO TABS
650.0000 mg | ORAL_TABLET | Freq: Four times a day (QID) | ORAL | Status: DC | PRN
  Administered 2023-06-17: 650 mg via ORAL
  Filled 2023-06-17 (×2): qty 2

## 2023-06-17 MED ORDER — HYDROCHLOROTHIAZIDE 12.5 MG PO TABS
12.5000 mg | ORAL_TABLET | Freq: Every day | ORAL | Status: DC
  Administered 2023-06-18: 12.5 mg via ORAL
  Filled 2023-06-17 (×2): qty 1

## 2023-06-17 MED ORDER — METRONIDAZOLE 500 MG/100ML IV SOLN
500.0000 mg | Freq: Two times a day (BID) | INTRAVENOUS | Status: DC
  Administered 2023-06-17: 500 mg via INTRAVENOUS
  Filled 2023-06-17: qty 100

## 2023-06-17 MED ORDER — ACETAMINOPHEN 325 MG PO TABS
325.0000 mg | ORAL_TABLET | Freq: Once | ORAL | Status: AC
  Administered 2023-06-17: 325 mg via ORAL
  Filled 2023-06-17: qty 1

## 2023-06-17 MED ORDER — DOCUSATE SODIUM 100 MG PO CAPS
100.0000 mg | ORAL_CAPSULE | Freq: Two times a day (BID) | ORAL | Status: DC
  Administered 2023-06-17 – 2023-06-25 (×13): 100 mg via ORAL
  Filled 2023-06-17 (×17): qty 1

## 2023-06-17 MED ORDER — LACTATED RINGERS IV SOLN: INTRAVENOUS | Status: DC

## 2023-06-17 MED ORDER — SODIUM CHLORIDE 0.9 % IV SOLN: 2.0000 g | Freq: Two times a day (BID) | INTRAVENOUS | Status: DC

## 2023-06-17 MED ORDER — VANCOMYCIN HCL 1750 MG/350ML IV SOLN
1750.0000 mg | INTRAVENOUS | Status: DC
  Administered 2023-06-17: 1750 mg via INTRAVENOUS
  Filled 2023-06-17: qty 350

## 2023-06-17 MED ORDER — ALBUTEROL SULFATE (2.5 MG/3ML) 0.083% IN NEBU
2.5000 mg | INHALATION_SOLUTION | Freq: Four times a day (QID) | RESPIRATORY_TRACT | Status: DC
  Administered 2023-06-17 – 2023-06-19 (×8): 2.5 mg via RESPIRATORY_TRACT
  Filled 2023-06-17 (×8): qty 3

## 2023-06-17 MED ORDER — FINASTERIDE 5 MG PO TABS
5.0000 mg | ORAL_TABLET | Freq: Every day | ORAL | Status: DC
  Administered 2023-06-18 – 2023-06-25 (×8): 5 mg via ORAL
  Filled 2023-06-17 (×8): qty 1

## 2023-06-17 MED ORDER — EZETIMIBE 10 MG PO TABS
10.0000 mg | ORAL_TABLET | Freq: Every day | ORAL | Status: DC
  Administered 2023-06-17 – 2023-06-25 (×9): 10 mg via ORAL
  Filled 2023-06-17 (×9): qty 1

## 2023-06-17 MED ORDER — MORPHINE SULFATE (PF) 4 MG/ML IV SOLN
4.0000 mg | Freq: Once | INTRAVENOUS | Status: DC
  Filled 2023-06-17: qty 1

## 2023-06-17 MED ORDER — ONDANSETRON HCL 4 MG PO TABS: 4.0000 mg | ORAL_TABLET | Freq: Four times a day (QID) | ORAL | Status: DC | PRN

## 2023-06-17 MED ORDER — METOPROLOL SUCCINATE ER 25 MG PO TB24
25.0000 mg | ORAL_TABLET | Freq: Two times a day (BID) | ORAL | Status: DC
  Administered 2023-06-17 – 2023-06-25 (×15): 25 mg via ORAL
  Filled 2023-06-17 (×16): qty 1

## 2023-06-17 NOTE — ED Triage Notes (Signed)
 Pt presents via POV c/o fever at home and increased swelling and pain in right lower leg. Pt is currently being treated for a wound in the same leg.

## 2023-06-17 NOTE — ED Notes (Signed)
 Pt aware of the need for a urine... Unable to currently provide the sample.Logan KitchenMarland French

## 2023-06-17 NOTE — Progress Notes (Signed)
 Pharmacy Antibiotic Note  Logan French is a 89 y.o. male admitted on 06/17/2023 with  infection of unknown source .  Pharmacy has been consulted for cefepime and vanco dosing.  Plan: Vanco 1750 mg iv q24h eAUC 521 scr 1.42 Vd 0.72 Cefepime 2 gram iv q12h Flagyl 500 mg iv q12h (MD ordered)  Height: 6' (182.9 cm) Weight: 98.2 kg (216 lb 7.9 oz) IBW/kg (Calculated) : 77.6  Temp (24hrs), Avg:101.2 F (38.4 C), Min:99 F (37.2 C), Max:103 F (39.4 C)  Recent Labs  Lab 06/17/23 0430 06/17/23 0813  WBC 18.9*  --   CREATININE 1.42*  --   LATICACIDVEN 2.8* 2.4*    Estimated Creatinine Clearance: 45.3 mL/min (A) (by C-G formula based on SCr of 1.42 mg/dL (H)).    No Known Allergies    Thank you for allowing pharmacy to be a part of this patient's care.   Greta Doom BS, PharmD, BCPS Clinical Pharmacist 06/17/2023 2:09 PM  Contact: [phone] after 3 PM  "Be curious, not judgmental..." -Debbora Dus

## 2023-06-17 NOTE — ED Notes (Signed)
 Report given to the floor RN.

## 2023-06-17 NOTE — Progress Notes (Signed)
   06/17/23 1420  TOC Brief Assessment  Insurance and Status Reviewed (Medicare A and B)  Patient has primary care physician Yes (Busy  Garlan Fillers, MD)  Home environment has been reviewed From home with Spouse  Prior level of function: independent  Prior/Current Home Services No current home services (Does go to [facility] as OP)  Social Drivers of Health Review SDOH reviewed no interventions necessary  Readmission risk has been reviewed Yes (17%)  Transition of care needs no transition of care needs at this time   From home with Spouse  Following for any TOC needs Please place consult should a TOC need arise

## 2023-06-17 NOTE — ED Notes (Signed)
 Report given to [facility].

## 2023-06-17 NOTE — Progress Notes (Signed)
 Plan of Care Note for accepted transfer   Patient: Logan French MRN: [id number]   DOA: 06/17/2023  Facility requesting transfer: [facility]   Requesting Provider: Dr. Preston Fleeting   Reason for transfer: Cellulitis   Facility course: 89 yr old with atrial fibrillation, right leg wound, and PAD s/p recent intervention on RLE who presents with fever and increasing RLE pain and swelling.   He is febrile in ED with normal HR and BP, mild AKI, leukocytosis, and elevated lactate. Blood cultures were collected, 3 liters LR bolused, and Rocephin started.   Plan of care: The patient is accepted for admission to Progressive unit, at [facility]   Author: Briscoe Deutscher, MD 06/17/2023  Check [email or web address] for on-call coverage.  Nursing staff, Please call [facility] Admits & Consults System-Wide number on Amion as soon as patient's arrival, so appropriate admitting provider can evaluate the pt.

## 2023-06-17 NOTE — ED Provider Notes (Signed)
 [place] EMERGENCY DEPARTMENT AT [facility] Provider Note   CSN: [id number] Arrival date & time: 06/17/23  0345     History  Chief Complaint  Patient presents with   Leg Pain    Logan French is a 89 y.o. male.  The history is provided by the patient.  He has history of hypertension, hyperlipidemia, atrial fibrillation anticoagulated on apixaban, peripheral vascular disease status post angioplasty of right popliteal artery and comes in because of subjective fever, chills, pain and redness and swelling of his right leg tonight.  He denies chest pain, shortness of breath, nausea, vomiting.   Home Medications Prior to Admission medications   Medication Sig Start Date End Date Taking? Authorizing Provider  acetaminophen (TYLENOL) 500 MG tablet Take 1,000 mg by mouth every 6 (six) hours as needed for moderate pain (pain score 4-6), mild pain (pain score 1-3) or headache.    [provider]  amLODipine (NORVASC) 5 MG tablet Take 5 mg by mouth daily.  06/14/17   [provider]  ezetimibe (ZETIA) 10 MG tablet Take 10 mg by mouth daily. 10/12/17   [provider]  finasteride (PROSCAR) 5 MG tablet Take 5 mg by mouth daily.    [provider]  hydrochlorothiazide (MICROZIDE) 12.5 MG capsule TAKE ONE CAPSULE EVERY DAY 12/16/22   Lennette Bihari, MD  metoprolol succinate (TOPROL-XL) 25 MG 24 hr tablet TAKE ONE TABLET BY MOUTH EVERY DAY 11/17/22   Lennette Bihari, MD  mupirocin ointment (BACTROBAN) 2 % Apply 1 Application topically daily as needed (wound care). 01/14/23   [provider]  rosuvastatin (CRESTOR) 20 MG tablet TAKE ONE TABLET DAILY 11/05/22   Lennette Bihari, MD  STUDY - LIBREXIA-AF - apixaban 5 mg or placebo capsule (PI-Sethi) Take 1 capsule (5 mg total) by mouth 2 (two) times daily. Take at approximately the same time of day with or without food. Please bring bottle back with you to every visit; do not discard bottle. 03/25/23    Micki Riley, MD  STUDY Lois Huxley - [id number] (Milvexian) 100 mg or placebo tablet (PI-Sethi) Take 1 tablet by mouth 2 (two) times daily. Take at approximately the same time of day with or without food. Please bring bottle back with you to every visit; do not discard bottle. 03/25/23   Micki Riley, MD  vitamin B-12 (CYANOCOBALAMIN) 500 MCG tablet Take 500 mcg by mouth daily.     [provider]      Allergies    Patient has no known allergies.    Review of Systems   Review of Systems  All other systems reviewed and are negative.   Physical Exam Updated Vital Signs BP 126/64   Pulse 64   Temp (!) 103 F (39.4 C) (Oral)   Resp (!) 24   SpO2 97%  Physical Exam Vitals and nursing note reviewed.   89 year old male, resting comfortably and in no acute distress. Vital signs are significant for markedly elevated temperature. Oxygen saturation is 99%, which is normal. Head is normocephalic and atraumatic. PERRLA, EOMI. Oropharynx is clear. Neck is nontender and supple without adenopathy. Lungs are clear without rales, wheezes, or rhonchi. Chest is nontender. Heart has regular rate and rhythm without murmur. Abdomen is soft, flat, nontender. Extremities: There is erythema and soft tissue swelling of the right lower leg compared with the left lower leg.  He has a chronic wound present on the posterior aspect of the right  lower leg with dressing in place which was not removed.  Right calf circumferences 5 cm greater than left calf circumference.  There is 2+ pitting edema of the left leg, 3+ pitting edema of the right leg.  Capillary refill is prompt. Skin is warm and dry without rash. Neurologic: Mental status is normal, cranial nerves are intact, moves all extremities equally.   ED Results / Procedures / Treatments   Labs (all labs ordered are listed, but only abnormal results are displayed) Labs Reviewed  LACTIC ACID, PLASMA - Abnormal; Notable for the following  components:      Result Value   Lactic Acid, Venous 2.8 (*)    All other components within normal limits  COMPREHENSIVE METABOLIC PANEL WITH GFR - Abnormal; Notable for the following components:   Sodium 134 (*)    Glucose, Bld 134 (*)    BUN 26 (*)    Creatinine, Ser 1.42 (*)    Calcium 8.6 (*)    Total Protein 6.1 (*)    Albumin 3.4 (*)    Total Bilirubin 1.3 (*)    GFR, Estimated 48 (*)    All other components within normal limits  CBC WITH DIFFERENTIAL/PLATELET - Abnormal; Notable for the following components:   WBC 18.9 (*)    Hemoglobin 12.3 (*)    HCT 37.0 (*)    Neutro Abs 16.2 (*)    Monocytes Absolute 1.2 (*)    Abs Immature Granulocytes 0.34 (*)    All other components within normal limits  PROTIME-INR - Abnormal; Notable for the following components:   Prothrombin Time 22.8 (*)    INR 2.0 (*)    All other components within normal limits  APTT - Abnormal; Notable for the following components:   aPTT 38 (*)    All other components within normal limits  CULTURE, BLOOD (ROUTINE X 2)  CULTURE, BLOOD (ROUTINE X 2)  LACTIC ACID, PLASMA  URINALYSIS, W/ REFLEX TO CULTURE (INFECTION SUSPECTED)    EKG EKG Interpretation Date/Time:  Thursday June 17 2023 05:02:41 EDT Ventricular Rate:  75 PR Interval:    QRS Duration:  136 QT Interval:  347 QTC Calculation: 383 R Axis:   0  Text Interpretation: Atrial fibrillation Ventricular premature complex Nonspecific intraventricular conduction delay Borderline repolarization abnormality When compared with ECG of 11/06/2022, Premature ventricular complexes are now present Confirmed by Dione Booze ([id number]) on 06/17/2023 5:10:06 AM  Radiology DG Chest Port 1 View Result Date: 06/17/2023 CLINICAL DATA:  Questionable sepsis, presented for evaluation fevers at home. EXAM: PORTABLE CHEST 1 VIEW COMPARISON:  None Available. FINDINGS: There is a mildly low inspiration. There is linear atelectasis in lung bases. No focal pneumonia is  evident. There is no substantial pleural effusion. There is mild cardiomegaly, without evidence of CHF. The mediastinum is normally outlined. There is calcification in the transverse aorta. Moderate thoracic spondylosis. 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 IMPRESSION: 1. Low inspiration with linear atelectasis in lung bases. No focal pneumonia is evident. 2. Mild cardiomegaly without evidence of CHF. 3. Aortic atherosclerosis. Electronically Signed   By: Almira Bar M.D.   On: 06/17/2023 04:52    Procedures Procedures    Medications Ordered in ED Medications  morphine (PF) 4 MG/ML injection 4 mg (4 mg Intravenous Patient Refused/Not Given 06/17/23 0456)  lactated ringers infusion (has no administration in time range)  lactated ringers bolus 1,000 mL (1,000 mLs Intravenous New Bag/Given 06/17/23 0555)    And  lactated ringers bolus 1,000  mL (1,000 mLs Intravenous New Bag/Given 06/17/23 0555)    And  lactated ringers bolus 1,000 mL (has no administration in time range)  cefTRIAXone (ROCEPHIN) 2 g in sodium chloride 0.9 % 100 mL IVPB (0 g Intravenous Stopped 06/17/23 0526)  acetaminophen (TYLENOL) tablet 650 mg (650 mg Oral Given 06/17/23 0455)    ED Course/ Medical Decision Making/ A&P                                 Medical Decision Making Amount and/or Complexity of Data Reviewed Labs: ordered. Radiology: ordered.  Risk OTC drugs. Prescription drug management. Decision regarding hospitalization.   Fever with erythema and swelling of the right lower leg consistent with cellulitis concerning for possible sepsis.  This is a presentation which entails a great number of treatment options and has significant risk for morbidity and complications.  I have initiated evolving sepsis pathway and I have ordered ceftriaxone for cellulitis and acetaminophen for fever.   Chest x-ray shows cardiomegaly without incidence of CHF, no evidence of pneumonia.  I have independently viewed the  image, and agree with radiologist's interpretation.  I have reviewed his electrocardiogram, my interpretation is atrial fibrillation with PVC, nonspecific intraventricular conduction delay, not significantly changed from prior.  I have reviewed his laboratory test, my interpretation is elevated lactic acid level consistent with sepsis, mild hyponatremia which is not felt to be clinically significant, elevated random glucose level, renal insufficiency which is new compared with 05/17/2023, marked leukocytosis consistent with infection, mild anemia which is new compared with 05/17/2023.  Based on elevated lactic acid level, I have ordered early goal-directed fluids.  Repeat lactic acid is pending.  I have discussed case with Dr. Antionette Char of [facility], who agrees to admit the patient.  CRITICAL CARE Performed by: Dione Booze Total critical care time: 60 minutes Critical care time was exclusive of separately billable procedures and treating other patients. Critical care was necessary to treat or prevent imminent or life-threatening deterioration. Critical care was time spent personally by me on the following activities: development of treatment plan with patient and/or surrogate as well as nursing, discussions with consultants, evaluation of patient's response to treatment, examination of patient, obtaining history from patient or surrogate, ordering and performing treatments and interventions, ordering and review of laboratory studies, ordering and review of radiographic studies, pulse oximetry and re-evaluation of patient's condition.  Final Clinical Impression(s) / ED Diagnoses Final diagnoses:  Cellulitis of right lower leg  Sepsis due to undetermined organism (HCC)  Renal insufficiency  Normochromic normocytic anemia  Elevated random blood glucose level    Rx / DC Orders ED Discharge Orders     None         Dione Booze, MD 06/17/23 [phone]

## 2023-06-17 NOTE — Progress Notes (Signed)
 Elink monitoring for the code sepsis protocol.

## 2023-06-17 NOTE — H&P (Signed)
 History and Physical    Patient: Logan French GNF:[id number] DOB: 04-02-1934 DOA: 06/17/2023 DOS: the patient was seen and examined on 06/17/2023 PCP: Garlan Fillers, MD  Patient coming from:  [facility] Chief complaint: Chief Complaint  Patient presents with   Leg Pain   HPI:  Logan French is a 89 y.o. male with past medical history  of peripheral vascular disease status post angioplasty of the right popliteal artery, essential hypertension, hyperlipidemia, BPH, dyslipidemia, B12 deficiency, coming from [facility] for right leg cellulitis and wound.  Patient states that since the procedure the wound has not healed, the right leg has remained swollen and is now progressively swollen.  Patient is being followed closely with general surgery and was seen on 1 April . No known history of diabetes.  ED Course: At [facility].  Patient noted to be septic and was started on IV fluids per sepsis protocol. Vital signs in the ED were notable for the following:  Vitals:   06/17/23 1130 06/17/23 1200 06/17/23 1204 06/17/23 1317  BP: 106/63 102/73    Pulse: 60 70    Temp:   99 F (37.2 C)   Resp: (!) 21 (!) 25    Height:    6' (1.829 m)  Weight:    98.2 kg  SpO2: 96% 95%    TempSrc:   Oral   BMI (Calculated):    29.36   >>Labs were notable for the following: CMP shows sodium 134 glucose 134 AKI with a creatinine of 1.42 EGFR of 48, total protein of 6.1 total bili of 1.3. Initial lactic of 2.8 going downtrending to 2.4 on repeat check. CBC showing leukocytosis of 18.9 anemia of 12.3. Chest x-ray done today cardiomegaly without any congestive heart failure. >>EKG: Independently reviewed: Low voltage EKG with heart rate of 75 and A-fib QRS of 136 and QTc noted to be 383.  Occasional PVCs noted.  >>While in the ED patient received the following: Medications  morphine (PF) 4 MG/ML injection 4 mg (4 mg Intravenous Patient Refused/Not Given 06/17/23 0456)  lactated ringers infusion (  Intravenous Restarted 06/17/23 1301)  finasteride (PROSCAR) tablet 5 mg (has no administration in time range)  ezetimibe (ZETIA) tablet 10 mg (has no administration in time range)  metoprolol succinate (TOPROL-XL) 24 hr tablet 25 mg (has no administration in time range)  hydrochlorothiazide (MICROZIDE) capsule 12.5 mg (has no administration in time range)  rosuvastatin (CRESTOR) tablet 20 mg (has no administration in time range)  STUDY - LIBREXIA-AF - apixaban 5 mg or placebo capsule (PI-Sethi) (has no administration in time range)  acetaminophen (TYLENOL) tablet 650 mg (has no administration in time range)    Or  acetaminophen (TYLENOL) suppository 650 mg (has no administration in time range)  docusate sodium (COLACE) capsule 100 mg (has no administration in time range)  ondansetron (ZOFRAN) tablet 4 mg (has no administration in time range)    Or  ondansetron (ZOFRAN) injection 4 mg (has no administration in time range)  metroNIDAZOLE (FLAGYL) IVPB 500 mg (has no administration in time range)  albuterol (PROVENTIL) (2.5 MG/3ML) 0.083% nebulizer solution 2.5 mg (has no administration in time range)  cefTRIAXone (ROCEPHIN) 2 g in sodium chloride 0.9 % 100 mL IVPB (0 g Intravenous Stopped 06/17/23 0526)  acetaminophen (TYLENOL) tablet 650 mg (650 mg Oral Given 06/17/23 0455)  lactated ringers bolus 1,000 mL (0 mLs Intravenous Stopped 06/17/23 0707)    And  lactated ringers bolus 1,000 mL (0 mLs Intravenous Stopped  06/17/23 0707)    And  lactated ringers bolus 1,000 mL (0 mLs Intravenous Stopped 06/17/23 0812)  acetaminophen (TYLENOL) tablet 325 mg (325 mg Oral Given 06/17/23 0749)    Review of Systems  Musculoskeletal:        Nonhealing vs cellulitis right leg wound.   All other systems reviewed and are negative.  Past Medical History:  Diagnosis Date   A-fib (HCC)    B12 deficiency    Diverticulosis    Fatigue    History of colonic polyps    Hyperlipemia    Hypertension    Pernicious anemia     Prostate cancer (HCC)    Syncope    Syncope    Past Surgical History:  Procedure Laterality Date   ABDOMINAL AORTOGRAM W/LOWER EXTREMITY N/A 05/17/2023   Procedure: ABDOMINAL AORTOGRAM W/LOWER EXTREMITY;  Surgeon: [name], MD;  Location: [facility] INVASIVE CV LAB;  Service: Cardiovascular;  Laterality: N/A;   CARDIOVERSION N/A 10/07/2021   Procedure: CARDIOVERSION;  Surgeon: Thomasene Ripple, DO;  Location: [facility] ENDOSCOPY;  Service: Cardiovascular;  Laterality: N/A;   IR ANGIO INTRA EXTRACRAN SEL COM CAROTID INNOMINATE BILAT MOD SED  09/10/2017   IR ANGIO VERTEBRAL SEL SUBCLAVIAN INNOMINATE BILAT MOD SED  09/10/2017   PERIPHERAL INTRAVASCULAR LITHOTRIPSY Right 05/17/2023   Procedure: PERIPHERAL INTRAVASCULAR LITHOTRIPSY;  Surgeon: [name], MD;  Location: [facility] INVASIVE CV LAB;  Service: Cardiovascular;  Laterality: Right;  SFA-POP   PERIPHERAL VASCULAR BALLOON ANGIOPLASTY Right 05/17/2023   Procedure: PERIPHERAL VASCULAR BALLOON ANGIOPLASTY;  Surgeon: [name], MD;  Location: [facility] INVASIVE CV LAB;  Service: Cardiovascular;  Laterality: Right;  SFA-POP    reports that he has never smoked. He has never used smokeless tobacco. He reports current alcohol use of about 2.0 standard drinks of alcohol per week. He reports that he does not use drugs.  No Known Allergies  History reviewed. No pertinent family history.  Prior to Admission medications   Medication Sig Start Date End Date Taking? Authorizing Provider  acetaminophen (TYLENOL) 500 MG tablet Take 1,000 mg by mouth every 6 (six) hours as needed for moderate pain (pain score 4-6), mild pain (pain score 1-3) or headache.    [provider]  amLODipine (NORVASC) 5 MG tablet Take 5 mg by mouth daily.  06/14/17   [provider]  ezetimibe (ZETIA) 10 MG tablet Take 10 mg by mouth daily. 10/12/17   [provider]  finasteride (PROSCAR) 5 MG tablet Take 5 mg by mouth daily.    [provider]   hydrochlorothiazide (MICROZIDE) 12.5 MG capsule TAKE ONE CAPSULE EVERY DAY 12/16/22   Lennette Bihari, MD  metoprolol succinate (TOPROL-XL) 25 MG 24 hr tablet TAKE ONE TABLET BY MOUTH EVERY DAY 11/17/22   Lennette Bihari, MD  mupirocin ointment (BACTROBAN) 2 % Apply 1 Application topically daily as needed (wound care). 01/14/23   [provider]  rosuvastatin (CRESTOR) 20 MG tablet TAKE ONE TABLET DAILY 11/05/22   Lennette Bihari, MD  STUDY - LIBREXIA-AF - apixaban 5 mg or placebo capsule (PI-Sethi) Take 1 capsule (5 mg total) by mouth 2 (two) times daily. Take at approximately the same time of day with or without food. Please bring bottle back with you to every visit; do not discard bottle. 03/25/23   Micki Riley, MD  STUDY Lois Huxley - [id number] (Milvexian) 100 mg or placebo tablet (PI-Sethi) Take 1 tablet by mouth 2 (two) times daily. Take at approximately the same  time of day with or without food. Please bring bottle back with you to every visit; do not discard bottle. 03/25/23   Micki Riley, MD  vitamin B-12 (CYANOCOBALAMIN) 500 MCG tablet Take 500 mcg by mouth daily.     [provider]                                                                                   Vitals:   06/17/23 1130 06/17/23 1200 06/17/23 1204 06/17/23 1317  BP: 106/63 102/73    Pulse: 60 70    Resp: (!) 21 (!) 25    Temp:   99 F (37.2 C)   TempSrc:   Oral   SpO2: 96% 95%    Weight:    98.2 kg  Height:    6' (1.829 m)   Physical Exam Vitals and nursing note reviewed.  Constitutional:      General: He is not in acute distress. HENT:     Head: Normocephalic and atraumatic.     Right Ear: Hearing normal.     Left Ear: Hearing normal.     Nose: Nose normal. No nasal deformity.     Mouth/Throat:     Lips: Pink.     Tongue: No lesions.     Pharynx: Oropharynx is clear.  Eyes:     General: Lids are normal.     Extraocular Movements: Extraocular movements intact.  Cardiovascular:      Rate and Rhythm: Normal rate. Rhythm irregular.     Pulses: Normal pulses.     Heart sounds: Normal heart sounds.  Pulmonary:     Effort: Pulmonary effort is normal.     Breath sounds: Normal breath sounds.  Abdominal:     General: Bowel sounds are normal. There is no distension.     Palpations: Abdomen is soft. There is no mass.     Tenderness: There is no abdominal tenderness.  Musculoskeletal:     Right lower leg: Edema present.     Left lower leg: No edema.  Skin:    General: Skin is warm.     Findings: Erythema present.  Neurological:     General: No focal deficit present.     Mental Status: He is alert and oriented to person, place, and time.     Cranial Nerves: Cranial nerves 2-12 are intact.  Psychiatric:        Attention and Perception: Attention normal.        Mood and Affect: Mood normal.        Speech: Speech normal.        Behavior: Behavior normal. Behavior is cooperative.      Labs on Admission: I have personally reviewed following labs and imaging studies  CBC: Recent Labs  Lab 06/17/23 0430  WBC 18.9*  NEUTROABS 16.2*  HGB 12.3*  HCT 37.0*  MCV 85.8  PLT 194   Basic Metabolic Panel: Recent Labs  Lab 06/17/23 0430  NA 134*  K 3.7  CL 100  CO2 22  GLUCOSE 134*  BUN 26*  CREATININE 1.42*  CALCIUM 8.6*   GFR: Estimated Creatinine Clearance: 45.3 mL/min (A) (by C-G formula based  on SCr of 1.42 mg/dL (H)). Liver Function Tests: Recent Labs  Lab 06/17/23 0430  AST 29  ALT 17  ALKPHOS 56  BILITOT 1.3*  PROT 6.1*  ALBUMIN 3.4*   No results for input(s): "LIPASE", "AMYLASE" in the last 168 hours. No results for input(s): "AMMONIA" in the last 168 hours. Coagulation Profile: Recent Labs  Lab 06/17/23 0430  INR 2.0*   Cardiac Enzymes: No results for input(s): "CKTOTAL", "CKMB", "CKMBINDEX", "TROPONINI" in the last 168 hours. BNP (last 3 results) No results for input(s): "PROBNP" in the last 8760 hours. HbA1C: No results for  input(s): "HGBA1C" in the last 72 hours. CBG: No results for input(s): "GLUCAP" in the last 168 hours. Lipid Profile: No results for input(s): "CHOL", "HDL", "LDLCALC", "TRIG", "CHOLHDL", "LDLDIRECT" in the last 72 hours. Thyroid Function Tests: No results for input(s): "TSH", "T4TOTAL", "FREET4", "T3FREE", "THYROIDAB" in the last 72 hours. Anemia Panel: No results for input(s): "VITAMINB12", "FOLATE", "FERRITIN", "TIBC", "IRON", "RETICCTPCT" in the last 72 hours. Urine analysis:    Component Value Date/Time   COLORURINE YELLOW 06/17/2023 1000   APPEARANCEUR CLEAR 06/17/2023 1000   LABSPEC 1.023 06/17/2023 1000   PHURINE 5.5 06/17/2023 1000   GLUCOSEU NEGATIVE 06/17/2023 1000   HGBUR TRACE (A) 06/17/2023 1000   BILIRUBINUR NEGATIVE 06/17/2023 1000   KETONESUR NEGATIVE 06/17/2023 1000   PROTEINUR 30 (A) 06/17/2023 1000   NITRITE NEGATIVE 06/17/2023 1000   LEUKOCYTESUR NEGATIVE 06/17/2023 1000   Radiological Exams on Admission: DG Chest Port 1 View Result Date: 06/17/2023 CLINICAL DATA:  Questionable sepsis, presented for evaluation fevers at home. EXAM: PORTABLE CHEST 1 VIEW COMPARISON:  None Available. FINDINGS: There is a mildly low inspiration. There is linear atelectasis in lung bases. No focal pneumonia is evident. There is no substantial pleural effusion. There is mild cardiomegaly, without evidence of CHF. The mediastinum is normally outlined. There is calcification in the transverse aorta. Moderate thoracic spondylosis. 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 IMPRESSION: 1. Low inspiration with linear atelectasis in lung bases. No focal pneumonia is evident. 2. Mild cardiomegaly without evidence of CHF. 3. Aortic atherosclerosis. Electronically Signed   By: Almira Bar M.D.   On: 06/17/2023 04:52  Data Reviewed: Relevant notes from primary care and specialist visits, past discharge summaries as available in EHR, including Care Everywhere. Prior diagnostic testing as  pertinent to current admission diagnoses, Updated medications and problem lists for reconciliation ED course, including vitals, labs, imaging, treatment and response to treatment,Triage notes, nursing and pharmacy notes and ED provider's notes Notable results as noted in HPI.Discussed case with EDMD/ ED APP/ or Specialty MD on call and as needed.  Assessment & Plan   >> Sepsis/right leg cellulitis: Erythema and swelling, ct ordered and pending. We will continue vancomycin and rocephin.  Add flagyl as I suspect pt may be a diabetic. We will also restrict sweets.  Follow culture sensitivity. Continue with broad-spectrum IV antibiotics.  >> Acute kidney injury: Lab Results  Component Value Date   CREATININE 1.42 (H) 06/17/2023   CREATININE 1.10 05/17/2023   CREATININE 1.14 07/16/2022  Secondary to sepsis patient has received fluid bolus per sepsis protocol.  Vitals are stable and due to history of CHF I have discontinued patient's LR .  Will follow creatinine if AKI persists will get renal ultrasound or CT scan to rule out obstruction or hydronephrosis. So far patient has received:  Intake/Output Summary (Last 24 hours) at 06/17/2023 2029 Last data filed at 06/17/2023 1200 Gross per 24  hour  Intake 3100 ml  Output 300 ml  Net 2800 ml    >> BPH: Will continue patient on his finasteride.    >> Paroxysmal atrial fibrillation: Currently patient is in A-fib, will monitor on telemetry, chronically Eliquis continued.  He is now enrolled in July Librexia AFAF trial comparing Eliquis versus milvexian -factor XI inhibitor.  >> Essential hypertension: Continue metoprolol, HCTZ.  Will also continue amlodipine.   >> PAD: Patient underwent aortogram and bilateral lower extremity angiogram, intravascular lithotripsy of right above-knee popliteal artery, drug-coated balloon angioplasty of the right popliteal and distal SFA, on 05/17/2023.  Pulses on today's exam are bounding and  strong.  >>Prediabetes: Suspect pt is a diabetic and will recommend gtt as outpatient basis and starting low dose treatment to prevent progression and also promote healing and although A1c is prediabetes by definition ti is only a reflection of his past 3 months he has risk factors for same with his elevated BMI.  DVT prophylaxis:  Eliquis  Consults:  None  Advance Care Planning:    Code Status: Full Code   Family Communication:  None  Disposition Plan:  Home.  Severity of Illness: The appropriate patient status for this patient is INPATIENT. Inpatient status is judged to be reasonable and necessary in order to provide the required intensity of service to ensure the patient's safety. The patient's presenting symptoms, physical exam findings, and initial radiographic and laboratory data in the context of their chronic comorbidities is felt to place them at high risk for further clinical deterioration. Furthermore, it is not anticipated that the patient will be medically stable for discharge from the hospital within 2 midnights of admission.   * I certify that at the point of admission it is my clinical judgment that the patient will require inpatient hospital care spanning beyond 2 midnights from the point of admission due to high intensity of service, high risk for further deterioration and high frequency of surveillance required.*  Author: Gertha Calkin, MD 06/17/2023 2:01 PM  For on call review [email or web address].   Unresulted Labs (From admission, onward)     Start     Ordered   06/18/23 0500  Protime-INR  Tomorrow morning,   R       Question:  Specimen collection method  Answer:  Lab=Lab collect   06/17/23 1401   06/18/23 0500  Cortisol-am, blood  Tomorrow morning,   R       Question:  Specimen collection method  Answer:  Lab=Lab collect   06/17/23 1401   06/18/23 0500  Comprehensive metabolic panel  Tomorrow morning,   R       Question:  Specimen collection method  Answer:  Lab=Lab  collect   06/17/23 1401   06/18/23 0500  CBC  Tomorrow morning,   R       Question:  Specimen collection method  Answer:  Lab=Lab collect   06/17/23 1401   06/17/23 1357  Hemoglobin A1c  Add-on,   AD       Question:  Specimen collection method  Answer:  Lab=Lab collect   06/17/23 1401   06/17/23 0403  Blood Culture (routine x 2)  (Undifferentiated presentation (screening labs and basic nursing orders))  BLOOD CULTURE X 2,   STAT      06/17/23 0403            Orders Placed This Encounter  Procedures   Blood Culture (routine x 2)   DG Chest [facility] [address]  Lactic acid, plasma   Comprehensive metabolic panel   CBC with Differential   Protime-INR   APTT   Urinalysis, w/ Reflex to Culture (Infection Suspected) -Urine, Clean Catch   Protime-INR   Cortisol-am, blood   Comprehensive metabolic panel   CBC   Hemoglobin A1c   Diet Heart Room service appropriate? Yes; Fluid consistency: Thin   Document height and weight   Assess and Document Glasgow Coma Scale   Document vital signs within 1-hour of fluid bolus completion.  Notify provider of abnormal vital signs despite fluid resuscitation.   Refer to Sidebar Report: Sepsis Bundle ED/IP   Notify provider for difficulties obtaining IV access   Initiate Carrier Fluid Protocol   DO NOT delay antibiotics if unable to obtain blood culture.   Cardiac Monitoring Continuous x 24 hours Indications for use: Other; other indications for use: high risk   Patient has an active order for admit to inpatient/place in observation   Vital signs   Notify physician (specify)   Mobility Protocol: No Restrictions RN to initiate protocols based on patient's level of care   Refer to Sidebar Report Refer to ICU, Med-Surg, Progressive, and Step-Down Mobility Protocol Sidebars   If lactate (lactic acid) >2, verify repeat lactic acid order has been placed to be drawn   Document vital signs within 1-hour of fluid bolus completion and notify provider of bolus  completion   Vital signs   Vital signs   RN to call RRT (rapid response team)   Notify physician (specify) If patient in A-Fib, change in heart rhythm, or HR > 125 beat/min   Initiate Adult Central Line Maintenance and Catheter Protocol for patients with central line (CVC, PICC, Port, Hemodialysis, Trialysis)   Apply Sepsis Care Plan   Refer to Sidebar Report: Sepsis Bundle ED/IP   Assess and Document Glasgow Coma Scale   Initiate Oral Care Protocol   Initiate Carrier Fluid Protocol   RN may order General Admission PRN Orders utilizing "General Admission PRN medications" (through manage orders) for the following patient needs: allergy symptoms (Claritin), cold sores (Carmex), cough (Robitussin DM), eye irritation (Liquifilm Tears), hemorrhoids (Tucks), indigestion (Maalox), minor skin irritation (Hydrocortisone Cream), muscle pain (Ben Gay), nose irritation (saline nasal spray) and sore throat (Chloraseptic spray).   Full code   Code Sepsis activation.  This occurs automatically when order is signed and prioritizes pharmacy, lab, and radiology services for STAT collections and interventions.  If [facility] downtime, call [facility] [phone]) to activate Code Sepsis.   Consult to hospitalist   Pharmacy Consult   ceFEPime (MAXIPIME) per pharmacy consult            vancomycin per pharmacy consult   Pulse oximetry check with vital signs   Pulse oximetry (single)   ED EKG   EKG 12-Lead   EKG   EKG   Insert peripheral IV X 1   Insert 2nd peripheral IV if not already present.   Admit to Inpatient (patient's expected length of stay will be greater than 2 midnights or inpatient only procedure)   Aspiration precautions   Fall precautions

## 2023-06-18 ENCOUNTER — Inpatient Hospital Stay (HOSPITAL_COMMUNITY)

## 2023-06-18 DIAGNOSIS — I4819 Other persistent atrial fibrillation: Secondary | ICD-10-CM | POA: Diagnosis not present

## 2023-06-18 DIAGNOSIS — R7881 Bacteremia: Secondary | ICD-10-CM

## 2023-06-18 DIAGNOSIS — B955 Unspecified streptococcus as the cause of diseases classified elsewhere: Secondary | ICD-10-CM | POA: Diagnosis not present

## 2023-06-18 DIAGNOSIS — A419 Sepsis, unspecified organism: Secondary | ICD-10-CM | POA: Diagnosis not present

## 2023-06-18 DIAGNOSIS — Z9889 Other specified postprocedural states: Secondary | ICD-10-CM

## 2023-06-18 DIAGNOSIS — I10 Essential (primary) hypertension: Secondary | ICD-10-CM

## 2023-06-18 DIAGNOSIS — S81801A Unspecified open wound, right lower leg, initial encounter: Secondary | ICD-10-CM

## 2023-06-18 DIAGNOSIS — I70291 Other atherosclerosis of native arteries of extremities, right leg: Secondary | ICD-10-CM

## 2023-06-18 DIAGNOSIS — B950 Streptococcus, group A, as the cause of diseases classified elsewhere: Secondary | ICD-10-CM

## 2023-06-18 DIAGNOSIS — L03115 Cellulitis of right lower limb: Secondary | ICD-10-CM

## 2023-06-18 DIAGNOSIS — S86001A Unspecified injury of right Achilles tendon, initial encounter: Secondary | ICD-10-CM

## 2023-06-18 DIAGNOSIS — Z79899 Other long term (current) drug therapy: Secondary | ICD-10-CM

## 2023-06-18 LAB — ECHOCARDIOGRAM COMPLETE
AR max vel: 1.46 cm2
AV Area VTI: 1.59 cm2
AV Area mean vel: 1.29 cm2
AV Mean grad: 4.3 mmHg
AV Peak grad: 7.8 mmHg
Ao pk vel: 1.39 m/s
Area-P 1/2: 3.66 cm2
Calc EF: 58.7 %
Est EF: 50
Height: 72 in
MV M vel: 5.14 m/s
MV Peak grad: 105.5 mmHg
MV VTI: 1.5 cm2
S' Lateral: 4 cm
Single Plane A2C EF: 60 %
Single Plane A4C EF: 61 %
Weight: 3470.92 [oz_av]

## 2023-06-18 LAB — CBC
HCT: 35.9 % — ABNORMAL LOW (ref 39.0–52.0)
Hemoglobin: 11.6 g/dL — ABNORMAL LOW (ref 13.0–17.0)
MCH: 28.3 pg (ref 26.0–34.0)
MCHC: 32.3 g/dL (ref 30.0–36.0)
MCV: 87.6 fL (ref 80.0–100.0)
Platelets: 160 10*3/uL (ref 150–400)
RBC: 4.1 MIL/uL — ABNORMAL LOW (ref 4.22–5.81)
RDW: 15.7 % — ABNORMAL HIGH (ref 11.5–15.5)
WBC: 18.8 10*3/uL — ABNORMAL HIGH (ref 4.0–10.5)
nRBC: 0 % (ref 0.0–0.2)

## 2023-06-18 LAB — PROTIME-INR
INR: 1.6 — ABNORMAL HIGH (ref 0.8–1.2)
Prothrombin Time: 19.6 s — ABNORMAL HIGH (ref 11.4–15.2)

## 2023-06-18 LAB — BLOOD CULTURE ID PANEL (REFLEXED) - BCID2

## 2023-06-18 LAB — COMPREHENSIVE METABOLIC PANEL WITH GFR
ALT: 19 U/L (ref 0–44)
AST: 34 U/L (ref 15–41)
Albumin: 2.4 g/dL — ABNORMAL LOW (ref 3.5–5.0)
Alkaline Phosphatase: 56 U/L (ref 38–126)
Anion gap: 13 (ref 5–15)
BUN: 20 mg/dL (ref 8–23)
CO2: 23 mmol/L (ref 22–32)
Calcium: 8.3 mg/dL — ABNORMAL LOW (ref 8.9–10.3)
Chloride: 99 mmol/L (ref 98–111)
Creatinine, Ser: 1.21 mg/dL (ref 0.61–1.24)
GFR, Estimated: 58 mL/min — ABNORMAL LOW (ref >60)
Glucose, Bld: 135 mg/dL — ABNORMAL HIGH (ref 70–99)
Potassium: 3.7 mmol/L (ref 3.5–5.1)
Sodium: 135 mmol/L (ref 135–145)
Total Bilirubin: 1.5 mg/dL — ABNORMAL HIGH (ref 0.0–1.2)
Total Protein: 5.1 g/dL — ABNORMAL LOW (ref 6.5–8.1)

## 2023-06-18 LAB — GLUCOSE, CAPILLARY
Glucose-Capillary: 139 mg/dL — ABNORMAL HIGH (ref 70–99)
Glucose-Capillary: 139 mg/dL — ABNORMAL HIGH (ref 70–99)
Glucose-Capillary: 208 mg/dL — ABNORMAL HIGH (ref 70–99)
Glucose-Capillary: 162 mg/dL — ABNORMAL HIGH (ref 70–99)
Glucose-Capillary: 161 mg/dL — ABNORMAL HIGH (ref 70–99)

## 2023-06-18 LAB — CORTISOL-AM, BLOOD: Cortisol - AM: 14.1 ug/dL (ref 6.7–22.6)

## 2023-06-18 MED ORDER — INSULIN ASPART 100 UNIT/ML IJ SOLN
0.0000 [IU] | Freq: Three times a day (TID) | INTRAMUSCULAR | Status: DC
  Administered 2023-06-18: 3 [IU] via SUBCUTANEOUS
  Administered 2023-06-18: 2 [IU] via SUBCUTANEOUS
  Administered 2023-06-19: 1 [IU] via SUBCUTANEOUS
  Administered 2023-06-19: 3 [IU] via SUBCUTANEOUS
  Administered 2023-06-20 – 2023-06-21 (×3): 1 [IU] via SUBCUTANEOUS
  Administered 2023-06-21: 2 [IU] via SUBCUTANEOUS
  Administered 2023-06-21 – 2023-06-23 (×4): 1 [IU] via SUBCUTANEOUS
  Administered 2023-06-23 – 2023-06-24 (×2): 2 [IU] via SUBCUTANEOUS
  Administered 2023-06-24: 1 [IU] via SUBCUTANEOUS
  Administered 2023-06-25: 2 [IU] via SUBCUTANEOUS

## 2023-06-18 MED ORDER — PENICILLIN G POTASSIUM 20000000 UNITS IJ SOLR
4.0000 10*6.[IU] | Freq: Four times a day (QID) | INTRAVENOUS | Status: DC
  Administered 2023-06-18: 4 10*6.[IU] via INTRAVENOUS
  Filled 2023-06-18 (×3): qty 4

## 2023-06-18 MED ORDER — MORPHINE SULFATE (PF) 2 MG/ML IV SOLN
1.0000 mg | INTRAVENOUS | Status: DC | PRN
  Administered 2023-06-18 – 2023-06-21 (×4): 1 mg via INTRAVENOUS
  Filled 2023-06-18 (×4): qty 1

## 2023-06-18 MED ORDER — MUPIROCIN 2 % EX OINT
TOPICAL_OINTMENT | Freq: Every day | CUTANEOUS | Status: DC
  Filled 2023-06-18 (×4): qty 22

## 2023-06-18 MED ORDER — OXYCODONE HCL 5 MG PO TABS
5.0000 mg | ORAL_TABLET | ORAL | Status: DC | PRN
  Administered 2023-06-18 – 2023-06-22 (×11): 5 mg via ORAL
  Filled 2023-06-18 (×12): qty 1

## 2023-06-18 MED ORDER — CLOPIDOGREL BISULFATE 75 MG PO TABS
75.0000 mg | ORAL_TABLET | Freq: Every day | ORAL | Status: DC
  Administered 2023-06-18 – 2023-06-25 (×8): 75 mg via ORAL
  Filled 2023-06-18 (×8): qty 1

## 2023-06-18 MED ORDER — POLYETHYLENE GLYCOL 3350 17 G PO PACK
17.0000 g | PACK | Freq: Every day | ORAL | Status: DC
  Administered 2023-06-18 – 2023-06-25 (×7): 17 g via ORAL
  Filled 2023-06-18 (×7): qty 1

## 2023-06-18 MED ORDER — PANTOPRAZOLE SODIUM 40 MG PO TBEC
40.0000 mg | DELAYED_RELEASE_TABLET | Freq: Every day | ORAL | Status: DC
  Administered 2023-06-18 – 2023-06-25 (×8): 40 mg via ORAL
  Filled 2023-06-18 (×8): qty 1

## 2023-06-18 MED ORDER — LINEZOLID 600 MG/300ML IV SOLN
600.0000 mg | Freq: Two times a day (BID) | INTRAVENOUS | Status: DC
Start: 2023-06-18 — End: 2023-06-21
  Administered 2023-06-18 – 2023-06-21 (×7): 600 mg via INTRAVENOUS
  Filled 2023-06-18 (×7): qty 300

## 2023-06-18 MED ORDER — PENICILLIN G POTASSIUM 20000000 UNITS IJ SOLR
9.0000 10*6.[IU] | Freq: Two times a day (BID) | INTRAVENOUS | Status: DC
  Administered 2023-06-18 – 2023-06-23 (×11): 9 10*6.[IU] via INTRAVENOUS
  Filled 2023-06-18 (×6): qty 9
  Filled 2023-06-18: qty 8
  Filled 2023-06-18 (×7): qty 9

## 2023-06-18 MED ORDER — STUDY - LIBREXIA-AF - JNJ-70033093 (MILVEXIAN) 100 MG OR PLACEBO (PI-SETHI)
1.0000 | ORAL_TABLET | Freq: Two times a day (BID) | ORAL | Status: DC
  Administered 2023-06-18 – 2023-06-25 (×15): 1 via ORAL
  Filled 2023-06-18 (×17): qty 1

## 2023-06-18 MED ORDER — STUDY - LIBREXIA-AF - APIXABAN 5 MG OR PLACEBO CAPSULE (PI-SETHI)
5.0000 mg | ORAL_CAPSULE | Freq: Two times a day (BID) | ORAL | Status: DC
  Administered 2023-06-18 – 2023-06-25 (×15): 5 mg via ORAL
  Filled 2023-06-18 (×17): qty 1

## 2023-06-18 NOTE — Progress Notes (Signed)
 Inpatient Rehab Admissions Coordinator:  Consult received. Attempted to discuss CIR with pt. He requested AC come back at later date. Will continue follow.   Wolfgang Phoenix, MS, CCC-SLP Admissions Coordinator [phone]

## 2023-06-18 NOTE — Evaluation (Signed)
 Occupational Therapy Evaluation Patient Details Name: Logan French MRN: [id number] DOB: December 24, 1934 Today's Date: 06/18/2023   History of Present Illness   Pt is an 89 y/o male admitted for RLE cellulitis and nonhealing wound. +for bacetermia. PMH: prostate cancer, anemia, HTN, HLD, a fib, b12 deficiency, PAD s/p vascular intervention.     Clinical Impressions PTA, pt lives with spouse in a multi level home and typically completely Independent in all daily tasks. Pt presents now with deficits in RLE (calf) pain, standing balance and overall endurance. Pt requiring external support + DME to maintain balance and manage transfers with Min A. Unable to progress mobility further due to pain. Pt requiring Setup Assist for UB ADL and Mod A for LB ADLs d/t deficits. Hope for pain to improve acutely to allow easier mobility but do have concerns if pt discharges directly home d/t multiple steps in/outside of home. Will continue to follow acutely.      If plan is discharge home, recommend the following:   A lot of help with walking and/or transfers;A lot of help with bathing/dressing/bathroom;Assistance with cooking/housework;Help with stairs or ramp for entrance;Assist for transportation     Functional Status Assessment   Patient has had a recent decline in their functional status and demonstrates the ability to make significant improvements in function in a reasonable and predictable amount of time.     Equipment Recommendations   Other (comment) (RW; TBD)     Recommendations for Other Services   Rehab consult     Precautions/Restrictions   Precautions Precautions: Fall Restrictions Weight Bearing Restrictions Per Provider Order: No     Mobility Bed Mobility Overal bed mobility: Needs Assistance Bed Mobility: Supine to Sit     Supine to sit: Mod assist, HOB elevated, Used rails     General bed mobility comments: assist to scoot hips to EOB and lift trunk     Transfers Overall transfer level: Needs assistance Equipment used: None Transfers: Sit to/from Stand, Bed to chair/wheelchair/BSC Sit to Stand: Min assist     Step pivot transfers: Contact guard assist     General transfer comment: Min A to stand without AD. Once up, pt then reaching for RW and used to step to recliner. cues to offload painful LE with RW      Balance Overall balance assessment: Needs assistance Sitting-balance support: No upper extremity supported, Feet supported Sitting balance-Leahy Scale: Fair     Standing balance support: During functional activity, Bilateral upper extremity supported Standing balance-Leahy Scale: Poor                             ADL either performed or assessed with clinical judgement   ADL Overall ADL's : Needs assistance/impaired Eating/Feeding: Independent   Grooming: Set up;Sitting   Upper Body Bathing: Set up;Sitting   Lower Body Bathing: Moderate assistance;Sitting/lateral leans;Sit to/from stand   Upper Body Dressing : Set up;Sitting   Lower Body Dressing: Moderate assistance;Sit to/from stand;Sitting/lateral leans Lower Body Dressing Details (indicate cue type and reason): assisted for socks Toilet Transfer: Minimal assistance;Stand-pivot;Rolling walker (2 wheels)   Toileting- Clothing Manipulation and Hygiene: Minimal assistance;Sitting/lateral lean;Sit to/from stand               Vision Baseline Vision/History: 1 Wears glasses Ability to See in Adequate Light: 1 Impaired Patient Visual Report: No change from baseline Vision Assessment?: No apparent visual deficits Additional Comments: no change from baseline  Perception         Praxis         Pertinent Vitals/Pain Pain Assessment Pain Assessment: Faces Faces Pain Scale: Hurts even more Pain Location: R calf Pain Descriptors / Indicators: Grimacing, Guarding, Aching, Sore Pain Intervention(s): Monitored during session, Limited  activity within patient's tolerance, RN gave pain meds during session     Extremity/Trunk Assessment Upper Extremity Assessment Upper Extremity Assessment: Overall WFL for tasks assessed;Right hand dominant   Lower Extremity Assessment Lower Extremity Assessment: Defer to PT evaluation   Cervical / Trunk Assessment Cervical / Trunk Assessment: Normal   Communication Communication Communication: No apparent difficulties   Cognition Arousal: Alert Behavior During Therapy: WFL for tasks assessed/performed Cognition: No apparent impairments                               Following commands: Intact       Cueing  General Comments   Cueing Techniques: Verbal cues;Gestural cues  MD entering during session, Wife entering at end of session   Exercises     Shoulder Instructions      Home Living Family/patient expects to be discharged to:: Private residence Living Arrangements: Spouse/significant other Available Help at Discharge: Family Type of Home: House Home Access: Stairs to enter [employer] of Steps: "just a couple"   Home Layout: Multi-level;Able to live on main level with bedroom/bathroom Alternate Level Stairs-Number of Steps: 2 steps down into kitchen/living room. main level has bedroom/bathroom and an upstairs that pt does not have to use             Home Equipment: Cane - single point          Prior Functioning/Environment Prior Level of Function : Independent/Modified Independent;Driving             Mobility Comments: no AD for mobility ADLs Comments: Indep with ADLs, IADLs    OT Problem List: Pain;Decreased activity tolerance;Impaired balance (sitting and/or standing);Decreased knowledge of use of DME or AE   OT Treatment/Interventions: Therapeutic exercise;Self-care/ADL training;Energy conservation;DME and/or AE instruction;Therapeutic activities;Balance training;Patient/family education      OT Goals(Current goals  can be found in the care plan section)   Acute Rehab OT Goals Patient Stated Goal: decrease pain OT Goal Formulation: With patient/family Time For Goal Achievement: 07/02/23 Potential to Achieve Goals: Good ADL Goals Pt Will Perform Lower Body Bathing: with modified independence;sit to/from stand;sitting/lateral leans Pt Will Perform Lower Body Dressing: with modified independence;sit to/from stand;sitting/lateral leans Pt Will Transfer to Toilet: with modified independence;ambulating   OT Frequency:  Min 2X/week    Co-evaluation              AM-PAC OT "6 Clicks" Daily Activity     Outcome Measure Help from another person eating meals?: None Help from another person taking care of personal grooming?: A Little Help from another person toileting, which includes using toliet, bedpan, or urinal?: A Little Help from another person bathing (including washing, rinsing, drying)?: A Lot Help from another person to put on and taking off regular upper body clothing?: A Little Help from another person to put on and taking off regular lower body clothing?: A Lot 6 Click Score: 17   End of Session Equipment Utilized During Treatment: Gait belt;Rolling walker (2 wheels) Nurse Communication: Mobility status  Activity Tolerance: Patient tolerated treatment well;Patient limited by pain Patient left: in chair;with call bell/phone within reach;with chair alarm set  OT  Visit Diagnosis: Other abnormalities of gait and mobility (R26.89);Unsteadiness on feet (R26.81)                Time: 1610-9604 OT Time Calculation (min): 47 min Charges:  OT General Charges $OT Visit: 1 Visit OT Evaluation $OT Eval Moderate Complexity: 1 Mod OT Treatments $Self Care/Home Management : 8-22 mins $Therapeutic Activity: 8-22 mins  Bradd Canary, OTR/L Acute Rehab Services Office: [phone]   Lorre Munroe 06/18/2023, 8:26 AM

## 2023-06-18 NOTE — Progress Notes (Signed)
  Echocardiogram 2D Echocardiogram has been performed.  Ocie Doyne RDCS 06/18/2023, 3:01 PM

## 2023-06-18 NOTE — Progress Notes (Signed)
 PHARMACY - PHYSICIAN COMMUNICATION CRITICAL VALUE ALERT - BLOOD CULTURE IDENTIFICATION (BCID)  Logan French is an 89 y.o. male who presented to [facility] on 06/17/2023 with a chief complaint of RLE pain/cellulitis  Assessment:  Blood cultures growing Group A Strep  Name of physician (or Provider) Contacted:  Dr. Antionette Char  Current antibiotics:  Vancomycin Rocephin Flagyl  Changes to prescribed antibiotics recommended:  Change antibiotics to Penicillin G 4 million units IV q6h   Results for orders placed or performed during the hospital encounter of 06/17/23  Blood Culture ID Panel (Reflexed) (Collected: 06/17/2023  4:03 AM)  Result Value Ref Range   Enterococcus faecalis NOT DETECTED NOT DETECTED   Enterococcus Faecium NOT DETECTED NOT DETECTED   Listeria monocytogenes NOT DETECTED NOT DETECTED   Staphylococcus species NOT DETECTED NOT DETECTED   Staphylococcus aureus (BCID) NOT DETECTED NOT DETECTED   Staphylococcus epidermidis NOT DETECTED NOT DETECTED   Staphylococcus lugdunensis NOT DETECTED NOT DETECTED   Streptococcus species DETECTED (A) NOT DETECTED   Streptococcus agalactiae NOT DETECTED NOT DETECTED   Streptococcus pneumoniae NOT DETECTED NOT DETECTED   Streptococcus pyogenes DETECTED (A) NOT DETECTED   A.calcoaceticus-baumannii NOT DETECTED NOT DETECTED   Bacteroides fragilis NOT DETECTED NOT DETECTED   Enterobacterales NOT DETECTED NOT DETECTED   Enterobacter cloacae complex NOT DETECTED NOT DETECTED   Escherichia coli NOT DETECTED NOT DETECTED   Klebsiella aerogenes NOT DETECTED NOT DETECTED   Klebsiella oxytoca NOT DETECTED NOT DETECTED   Klebsiella pneumoniae NOT DETECTED NOT DETECTED   Proteus species NOT DETECTED NOT DETECTED   Salmonella species NOT DETECTED NOT DETECTED   Serratia marcescens NOT DETECTED NOT DETECTED   Haemophilus influenzae NOT DETECTED NOT DETECTED   Neisseria meningitidis NOT DETECTED NOT DETECTED   Pseudomonas aeruginosa NOT DETECTED  NOT DETECTED   Stenotrophomonas maltophilia NOT DETECTED NOT DETECTED   Candida albicans NOT DETECTED NOT DETECTED   Candida auris NOT DETECTED NOT DETECTED   Candida glabrata NOT DETECTED NOT DETECTED   Candida krusei NOT DETECTED NOT DETECTED   Candida parapsilosis NOT DETECTED NOT DETECTED   Candida tropicalis NOT DETECTED NOT DETECTED   Cryptococcus neoformans/gattii NOT DETECTED NOT DETECTED    Eddie Candle 06/18/2023  12:48 AM

## 2023-06-18 NOTE — Progress Notes (Addendum)
   06/18/23 1118  Mobility  Activity Ambulated with assistance in room  Level of Assistance Minimal assist, patient does 75% or more  Assistive Device Front wheel walker  Distance Ambulated (ft) 10 ft  Activity Response Tolerated fair  Mobility Referral Yes  Mobility visit 1 Mobility  Mobility Specialist Start Time (ACUTE ONLY) 1050  Mobility Specialist Stop Time (ACUTE ONLY) 1118  Mobility Specialist Time Calculation (min) (ACUTE ONLY) 28 min   Mobility Specialist: Progress Note  Pre-Mobility:      HR  90s, SpO2 96% RA Post-Mobility:    HR  70s, SpO2 97% RA  Pt agreeable to mobility session - received in chair. C/o RLE pain rated 7/10 eased a bit with ambulation. Pt is motivated to ambulate. Returned to bed with all needs met - call bell within reach. Bed alarm on.   Barnie Mort, BS Mobility Specialist Please contact via SecureChat or  Rehab office at [phone].

## 2023-06-18 NOTE — Progress Notes (Addendum)
 PROGRESS NOTE        PATIENT DETAILS Name: Logan French Age: 89 y.o. Sex: male Date of Birth: [birth year] Admit Date: 06/17/2023 Admitting Physician Briscoe Deutscher, MD [id number], Barry Dienes, MD  Brief Summary: Patient is a 89 y.o.  male with history of chronic right leg wound-followed by wound care-s/p recent PCI to popliteal artery-presented with severe RLE pain/swelling/erythema following a cat scratch/bite around 3/31.  Patient was found to have sepsis secondary to RLE cellulitis with streptococcal bacteremia and admitted to the hospitalist service.  Significant events: 4/3>> admitted to [facility]  Significant studies: 4/3>> CT right fibula/tibia: Extensive subcutaneous edema-no fluid collections-edematous changes appears superficial to muscular fascia.  Significant microbiology data: 4/3>> blood culture: Gram-positive cocci in chains  Procedures: None  Consults: ID VVS  Subjective: RLE pain is unchanged from yesterday-continues to have unchanged swelling/erythema.  Objective: Vitals: Blood pressure 125/88, pulse 75, temperature 99.9 F (37.7 C), temperature source Oral, resp. rate 18, height 6' (1.829 m), weight 98.4 kg, SpO2 96%.   Exam: Gen Exam:Alert awake-not in any distress HEENT:atraumatic, normocephalic Chest: B/L clear to auscultation anteriorly CVS:S1S2 regular Abdomen:soft non tender, non distended Extremities: RLE lower part-in dressing-erythema/swelling almost up to the knee-no crepitus. Neurology: Non focal Skin: no rash  Pertinent Labs/Radiology:    Latest Ref Rng & Units 06/18/2023    4:49 AM 06/17/2023    4:30 AM 05/17/2023    5:54 AM  CBC  WBC 4.0 - 10.5 K/uL 18.8  18.9    Hemoglobin 13.0 - 17.0 g/dL 98.1  19.1  47.8   Hematocrit 39.0 - 52.0 % 35.9  37.0  45.0   Platelets 150 - 400 K/uL 160  194      Lab Results  Component Value Date   NA 135 06/18/2023   K 3.7 06/18/2023   CL 99 06/18/2023   CO2 23 06/18/2023       Assessment/Plan: Sepsis secondary to RLE cellulitis and streptococcal bacteremia in a setting of cat scratch/cat bite Continues to have persistent RLE pain/erythema along with leukocytosis Blood cultures positive for Streptococcus pyogenes overnight Now on IV Pen G/Zyvox Repeat cultures 4/5 Transthoracic echo ordered ID consulted Watch closely  Chronic RLE wound PAD-s/p intravascular lithotripsy of above-knee popliteal artery/angioplasty of right popliteal artery/SFA on 3/3 Appreciate wound care consult Vascular surgery consulted given severe pain including pain at rest.  Persistent atrial fibrillation Telemetry monitoring Rate controlled with oral beta-blocker On Librexa AF trial drug (comparing Eliquis versus milvexian -factor XI inhibitor)  HTN BP stable-continue metoprolol Hold HCTZ for now  HLD Statin  New diagnosis of DM-2 (A1c 6.4 on 4/4) SSI for now Metformin on discharge.  BMI Estimated body mass index is 29.42 kg/m as calculated from the following:   Height as of this encounter: 6' (1.829 m).   Weight as of this encounter: 98.4 kg.   Code status:   Code Status: Full Code   DVT Prophylaxis: STUDY - LIBREXIA-AF - apixaban 5 mg or placebo capsule (PI-Sethi)    Family Communication: None at bedside   Disposition Plan: Status is: Inpatient Remains inpatient appropriate because: Severity of illness   Planned Discharge Destination:Home   Diet: Diet Order             Diet Heart Room service appropriate? Yes; Fluid consistency: Thin  Diet effective now  Antimicrobial agents: Anti-infectives (From admission, onward)    Start     Dose/Rate Route Frequency Ordered Stop   06/18/23 1000  penicillin G potassium 9 Million Units in dextrose 5 % 500 mL CONTINUOUS infusion        9 Million Units 41.7 mL/hr over 12 Hours Intravenous Every 12 hours 06/18/23 0848     06/18/23 1000  linezolid (ZYVOX) IVPB 600 mg        600 mg 300  mL/hr over 60 Minutes Intravenous Every 12 hours 06/18/23 0857     06/18/23 0600  cefTRIAXone (ROCEPHIN) 2 g in sodium chloride 0.9 % 100 mL IVPB  Status:  Discontinued        2 g 200 mL/hr over 30 Minutes Intravenous Every 24 hours 06/17/23 1435 06/18/23 0055   06/18/23 0600  penicillin G potassium 4 Million Units in dextrose 5 % 250 mL IVPB  Status:  Discontinued        4 Million Units 250 mL/hr over 60 Minutes Intravenous Every 6 hours 06/18/23 0055 06/18/23 0848   06/17/23 2200  metroNIDAZOLE (FLAGYL) IVPB 500 mg  Status:  Discontinued        500 mg 100 mL/hr over 60 Minutes Intravenous Every 12 hours 06/17/23 2030 06/18/23 0055   06/17/23 1500  metroNIDAZOLE (FLAGYL) IVPB 500 mg  Status:  Discontinued        500 mg 100 mL/hr over 60 Minutes Intravenous Every 12 hours 06/17/23 1401 06/17/23 1435   06/17/23 1500  ceFEPIme (MAXIPIME) 2 g in sodium chloride 0.9 % 100 mL IVPB  Status:  Discontinued        2 g 200 mL/hr over 30 Minutes Intravenous Every 12 hours 06/17/23 1404 06/17/23 1435   06/17/23 1500  vancomycin (VANCOREADY) IVPB 1750 mg/350 mL  Status:  Discontinued        1,750 mg 175 mL/hr over 120 Minutes Intravenous Every 24 hours 06/17/23 1406 06/18/23 0055   06/17/23 0415  cefTRIAXone (ROCEPHIN) 2 g in sodium chloride 0.9 % 100 mL IVPB        2 g 200 mL/hr over 30 Minutes Intravenous  Once 06/17/23 0404 06/17/23 0526        MEDICATIONS: Scheduled Meds:  albuterol  2.5 mg Nebulization Q6H   docusate sodium  100 mg Oral BID   ezetimibe  10 mg Oral Daily   finasteride  5 mg Oral Daily   hydrochlorothiazide  12.5 mg Oral Daily   metoprolol succinate  25 mg Oral Q12H    morphine injection  4 mg Intravenous Once   mupirocin ointment   Topical Daily   polyethylene glycol  17 g Oral Daily   rosuvastatin  20 mg Oral Daily   LIBREXIA-AF apixaban or placebo  5 mg Oral BID   LIBREXIA-AF [id number] (Milvexian) or placebo  1 tablet Oral BID   Continuous Infusions:  linezolid  (ZYVOX) IV     penicillin G potassium 9 Million Units in dextrose 5 % 500 mL CONTINUOUS infusion     PRN Meds:.acetaminophen **OR** acetaminophen, morphine injection, ondansetron **OR** ondansetron (ZOFRAN) IV, oxyCODONE   I have personally reviewed following labs and imaging studies  LABORATORY DATA: CBC: Recent Labs  Lab 06/17/23 0430 06/18/23 0449  WBC 18.9* 18.8*  NEUTROABS 16.2*  --   HGB 12.3* 11.6*  HCT 37.0* 35.9*  MCV 85.8 87.6  PLT 194 160    Basic Metabolic Panel: Recent Labs  Lab 06/17/23 0430 06/18/23 0449  NA 134* 135  K 3.7 3.7  CL 100 99  CO2 22 23  GLUCOSE 134* 135*  BUN 26* 20  CREATININE 1.42* 1.21  CALCIUM 8.6* 8.3*    GFR: Estimated Creatinine Clearance: 53.2 mL/min (by C-G formula based on SCr of 1.21 mg/dL).  Liver Function Tests: Recent Labs  Lab 06/17/23 0430 06/18/23 0449  AST 29 34  ALT 17 19  ALKPHOS 56 56  BILITOT 1.3* 1.5*  PROT 6.1* 5.1*  ALBUMIN 3.4* 2.4*   No results for input(s): "LIPASE", "AMYLASE" in the last 168 hours. No results for input(s): "AMMONIA" in the last 168 hours.  Coagulation Profile: Recent Labs  Lab 06/17/23 0430 06/18/23 0449  INR 2.0* 1.6*    Cardiac Enzymes: No results for input(s): "CKTOTAL", "CKMB", "CKMBINDEX", "TROPONINI" in the last 168 hours.  BNP (last 3 results) No results for input(s): "PROBNP" in the last 8760 hours.  Lipid Profile: No results for input(s): "CHOL", "HDL", "LDLCALC", "TRIG", "CHOLHDL", "LDLDIRECT" in the last 72 hours.  Thyroid Function Tests: No results for input(s): "TSH", "T4TOTAL", "FREET4", "T3FREE", "THYROIDAB" in the last 72 hours.  Anemia Panel: No results for input(s): "VITAMINB12", "FOLATE", "FERRITIN", "TIBC", "IRON", "RETICCTPCT" in the last 72 hours.  Urine analysis:    Component Value Date/Time   COLORURINE YELLOW 06/17/2023 1000   APPEARANCEUR CLEAR 06/17/2023 1000   LABSPEC 1.023 06/17/2023 1000   PHURINE 5.5 06/17/2023 1000   GLUCOSEU  NEGATIVE 06/17/2023 1000   HGBUR TRACE (A) 06/17/2023 1000   BILIRUBINUR NEGATIVE 06/17/2023 1000   KETONESUR NEGATIVE 06/17/2023 1000   PROTEINUR 30 (A) 06/17/2023 1000   NITRITE NEGATIVE 06/17/2023 1000   LEUKOCYTESUR NEGATIVE 06/17/2023 1000    Sepsis Labs: Lactic Acid, Venous    Component Value Date/Time   LATICACIDVEN 2.4 (HH) 06/17/2023 0813    MICROBIOLOGY: Recent Results (from the past 240 hours)  Blood Culture (routine x 2)     Status: None (Preliminary result)   Collection Time: 06/17/23  4:03 AM   Specimen: BLOOD RIGHT ARM  Result Value Ref Range Status   Specimen Description   Final    BLOOD RIGHT ARM Performed at [facility], [address] [place], [place] [address]    Special Requests   Final    BOTTLES DRAWN AEROBIC AND ANAEROBIC Blood Culture adequate volume Performed at [facility], [address] [place], [place] [address]    Culture  Setup Time   Final    GRAM POSITIVE COCCI IN CHAINS ANAEROBIC BOTTLE ONLY CRITICAL RESULT CALLED TO, READ BACK BY AND VERIFIED WITH: PHARMD G ABBOTT 06/18/2023 @ 0042 BY AB Performed at [facility], [address], [place], [place] [address]    Culture GRAM POSITIVE COCCI  Final   Report Status PENDING  Incomplete  Blood Culture ID Panel (Reflexed)     Status: Abnormal   Collection Time: 06/17/23  4:03 AM  Result Value Ref Range Status   Enterococcus faecalis NOT DETECTED NOT DETECTED Final   Enterococcus Faecium NOT DETECTED NOT DETECTED Final   Listeria monocytogenes NOT DETECTED NOT DETECTED Final   Staphylococcus species NOT DETECTED NOT DETECTED Final   Staphylococcus aureus (BCID) NOT DETECTED NOT DETECTED Final   Staphylococcus epidermidis NOT DETECTED NOT DETECTED Final   Staphylococcus lugdunensis NOT DETECTED NOT DETECTED Final   Streptococcus species DETECTED (A) NOT DETECTED Final    Comment: CRITICAL RESULT CALLED TO, READ BACK BY AND VERIFIED WITH: PHARMD  G ABBOTT 06/18/2023 @ 0042 BY AB  Streptococcus agalactiae NOT DETECTED NOT DETECTED Final   Streptococcus pneumoniae NOT DETECTED NOT DETECTED Final   Streptococcus pyogenes DETECTED (A) NOT DETECTED Final    Comment: CRITICAL RESULT CALLED TO, READ BACK BY AND VERIFIED WITH: PHARMD G ABBOTT 06/18/2023 @ 0042 BY AB    A.calcoaceticus-baumannii NOT DETECTED NOT DETECTED Final   Bacteroides fragilis NOT DETECTED NOT DETECTED Final   Enterobacterales NOT DETECTED NOT DETECTED Final   Enterobacter cloacae complex NOT DETECTED NOT DETECTED Final   Escherichia coli NOT DETECTED NOT DETECTED Final   Klebsiella aerogenes NOT DETECTED NOT DETECTED Final   Klebsiella oxytoca NOT DETECTED NOT DETECTED Final   Klebsiella pneumoniae NOT DETECTED NOT DETECTED Final   Proteus species NOT DETECTED NOT DETECTED Final   Salmonella species NOT DETECTED NOT DETECTED Final   Serratia marcescens NOT DETECTED NOT DETECTED Final   Haemophilus influenzae NOT DETECTED NOT DETECTED Final   Neisseria meningitidis NOT DETECTED NOT DETECTED Final   Pseudomonas aeruginosa NOT DETECTED NOT DETECTED Final   Stenotrophomonas maltophilia NOT DETECTED NOT DETECTED Final   Candida albicans NOT DETECTED NOT DETECTED Final   Candida auris NOT DETECTED NOT DETECTED Final   Candida glabrata NOT DETECTED NOT DETECTED Final   Candida krusei NOT DETECTED NOT DETECTED Final   Candida parapsilosis NOT DETECTED NOT DETECTED Final   Candida tropicalis NOT DETECTED NOT DETECTED Final   Cryptococcus neoformans/gattii NOT DETECTED NOT DETECTED Final    Comment: Performed at [facility], [address], [place], [place] [address]  Blood Culture (routine x 2)     Status: None (Preliminary result)   Collection Time: 06/17/23  4:08 AM   Specimen: BLOOD RIGHT HAND  Result Value Ref Range Status   Specimen Description   Final    BLOOD RIGHT HAND Performed at [facility], [address] [place], [place]  [address]    Special Requests   Final    BOTTLES DRAWN AEROBIC AND ANAEROBIC Blood Culture adequate volume Performed at [facility], [address] [place], [place] [address]    Culture  Setup Time   Final    GRAM POSITIVE COCCI IN PAIRS IN CHAINS AEROBIC BOTTLE ONLY CRITICAL VALUE NOTED.  VALUE IS CONSISTENT WITH PREVIOUSLY REPORTED AND CALLED VALUE. Performed at [facility], [address] [place], [place] [address]    Culture [facility] POSITIVE COCCI  Final   Report Status PENDING  Incomplete    RADIOLOGY STUDIES/RESULTS: CT TIBIA FIBULA RIGHT WO CONTRAST Result Date: 06/17/2023 CLINICAL DATA:  right leg wound and cellulitis. EXAM: CT OF THE LOWER RIGHT EXTREMITY WITHOUT CONTRAST TECHNIQUE: Multidetector CT imaging of the right lower extremity was performed according to the standard protocol. RADIATION DOSE REDUCTION: This exam was performed according to the departmental dose-optimization program which includes automated exposure control, adjustment of the mA and/or kV according to patient size and/or use of iterative reconstruction technique. COMPARISON:  None Available. FINDINGS: Bones/Joint/Cartilage Normal alignment. No acute fracture or dislocation. 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 No osseous erosions. Ligaments Suboptimally assessed by CT. Muscles and Tendons There is marked fatty atrophy of the visualized skeletal musculature,. There is thickening and intrasubstance calcification of the mid to distal Achilles tendon, likely the sequela of remote inflammation or trauma. No intramuscular fluid collections or intrafascial edema. Soft tissues There is extensive subcutaneous edema of the right lower extremity asymmetrically more severe along the anterolateral aspect.  No loculated subcutaneous fluid collections. The edematous changes appear superficial to the  investing muscular fascia. Small right knee effusion is partially visualized. IMPRESSION: 1. Extensive subcutaneous edema of the right lower extremity asymmetrically more severe along the anterolateral aspect. No loculated subcutaneous fluid collections. The edematous changes appear superficial to the investing muscular fascia. 2. No acute fracture or dislocation. No osseous erosions. 3. 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 4. 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 5. Marked fatty atrophy of the visualized skeletal musculature. 6. Thickening and intrasubstance calcification of the mid to distal Achilles tendon, likely the sequela of remote inflammation or trauma. Electronically Signed   By: Helyn Numbers M.D.   On: 06/17/2023 21:30   DG Chest Port 1 View Result Date: 06/17/2023 CLINICAL DATA:  Questionable sepsis, presented for evaluation fevers at home. EXAM: PORTABLE CHEST 1 VIEW COMPARISON:  None Available. FINDINGS: There is a mildly low inspiration. There is linear atelectasis in lung bases. No focal pneumonia is evident. There is no substantial pleural effusion. There is mild cardiomegaly, without evidence of CHF. The mediastinum is normally outlined. There is calcification in the transverse aorta. Moderate thoracic spondylosis. 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 IMPRESSION: 1. Low inspiration with linear atelectasis in lung bases. No focal pneumonia is evident. 2. Mild cardiomegaly without evidence of CHF. 3. Aortic atherosclerosis. Electronically Signed   By: Almira Bar M.D.   On: 06/17/2023 04:52     LOS: 1 day   Jeoffrey Massed, MD  [facility]    To contact the attending provider between 7A-7P or the covering provider during after hours 7P-7A, please log into the web site [email or web address] and access using universal [place] password for that  web site. If you do not have the password, please call the hospital operator.  06/18/2023, 9:35 AM

## 2023-06-18 NOTE — Progress Notes (Signed)
 Pt's medication from home were counted and deposited at the main pharmacy.   Filiberto Pinks, RN

## 2023-06-18 NOTE — Consult Note (Addendum)
 Hospital Consult    Reason for Consult: Right leg cellulitis Requesting Physician: [facility] MRN #:  [id number]  History of Present Illness: This is a 89 y.o. male with past medical history significant for hypertension, atrial fibrillation on anticoagulation, prostate cancer.  He is being seen in consultation for evaluation of right lower extremity wound and cellulitis.  He is status post angiogram with lithotripsy and drug-coated balloon angioplasty of the right popliteal and distal SFA by Dr. Hetty Blend on 06/13/2023 due to nonhealing Achilles wound.  Since then he has continued frequent follow-up with the wound clinic.  Unfortunately he developed cellulitis and has been admitted to the hospital.  Workup includes blood cultures growing group a streptococcus and ID has recommended penicillin and linezolid.  He denies any rest pain in the right foot and believes his circulation has improved since his procedure.  He continues to take Plavix in addition to being a member of a DOAC trial.  Past Medical History:  Diagnosis Date   A-fib (HCC)    B12 deficiency    Diverticulosis    Fatigue    History of colonic polyps    Hyperlipemia    Hypertension    Pernicious anemia    Prostate cancer (HCC)    Syncope    Syncope     Past Surgical History:  Procedure Laterality Date   ABDOMINAL AORTOGRAM W/LOWER EXTREMITY N/A 05/17/2023   Procedure: ABDOMINAL AORTOGRAM W/LOWER EXTREMITY;  Surgeon: [name], MD;  Location: [facility] INVASIVE CV LAB;  Service: Cardiovascular;  Laterality: N/A;   CARDIOVERSION N/A 10/07/2021   Procedure: CARDIOVERSION;  Surgeon: Thomasene Ripple, DO;  Location: [facility] ENDOSCOPY;  Service: Cardiovascular;  Laterality: N/A;   IR ANGIO INTRA EXTRACRAN SEL COM CAROTID INNOMINATE BILAT MOD SED  09/10/2017   IR ANGIO VERTEBRAL SEL SUBCLAVIAN INNOMINATE BILAT MOD SED  09/10/2017   PERIPHERAL INTRAVASCULAR LITHOTRIPSY Right 05/17/2023   Procedure: PERIPHERAL INTRAVASCULAR LITHOTRIPSY;  Surgeon:  [name], MD;  Location: [facility] INVASIVE CV LAB;  Service: Cardiovascular;  Laterality: Right;  SFA-POP   PERIPHERAL VASCULAR BALLOON ANGIOPLASTY Right 05/17/2023   Procedure: PERIPHERAL VASCULAR BALLOON ANGIOPLASTY;  Surgeon: [name], MD;  Location: [facility] INVASIVE CV LAB;  Service: Cardiovascular;  Laterality: Right;  SFA-POP    No Known Allergies  Prior to Admission medications   Medication Sig Start Date End Date Taking? Authorizing Provider  acetaminophen (TYLENOL) 500 MG tablet Take 1,500 mg by mouth daily as needed for moderate pain (pain score 4-6), mild pain (pain score 1-3) or headache.   Yes [provider]  amLODipine (NORVASC) 5 MG tablet Take 5 mg by mouth daily.  06/14/17  Yes [provider]  clopidogrel (PLAVIX) 75 MG tablet Take 75 mg by mouth daily.   Yes [provider]  ezetimibe (ZETIA) 10 MG tablet Take 10 mg by mouth daily. 10/12/17  Yes [provider]  finasteride (PROSCAR) 5 MG tablet Take 5 mg by mouth daily.   Yes [provider]  hydrochlorothiazide (MICROZIDE) 12.5 MG capsule TAKE ONE CAPSULE EVERY DAY 12/16/22  Yes Lennette Bihari, MD  metoprolol succinate (TOPROL-XL) 25 MG 24 hr tablet TAKE ONE TABLET BY MOUTH EVERY DAY 11/17/22  Yes Lennette Bihari, MD  rosuvastatin (CRESTOR) 20 MG tablet TAKE ONE TABLET DAILY 11/05/22  Yes Lennette Bihari, MD  STUDY - LIBREXIA-AF - apixaban 5 mg or placebo capsule (PI-Sethi) Take 1 capsule (5 mg total) by mouth 2 (two) times daily. Take at approximately the same  time of day with or without food. Please bring bottle back with you to every visit; do not discard bottle. 03/25/23  Yes Micki Riley, MD  STUDY Lois Huxley - [id number] (Milvexian) 100 mg or placebo tablet (PI-Sethi) Take 1 tablet by mouth 2 (two) times daily. Take at approximately the same time of day with or without food. Please bring bottle back with you to every visit; do not discard bottle. 03/25/23  Yes Micki Riley, MD  vitamin B-12 (CYANOCOBALAMIN) 500 MCG tablet Take 500 mcg by mouth daily.    Yes [provider]    Social History   Socioeconomic History   Marital status: Married    Spouse name: Not on file   Number of children: Not on file   Years of education: Not on file   Highest education level: Not on file  Occupational History   Not on file  Tobacco Use   Smoking status: Never   Smokeless tobacco: Never   Tobacco comments:    Never smoke 09/23/21  Vaping Use   Vaping status: Never Used  Substance and Sexual Activity   Alcohol use: Yes    Alcohol/week: 2.0 standard drinks of alcohol    Types: 1 Cans of beer, 1 Shots of liquor per week    Comment: occasionally   Drug use: No   Sexual activity: Not on file  Other Topics Concern   Not on file  Social History Narrative   Lives with wife   Right Handed   Drinks 1-2 cups daily   Social Drivers of Health   Financial Resource Strain: Not on file  Food Insecurity: No Food Insecurity (06/17/2023)   Hunger Vital Sign    Worried About Running Out of Food in the Last Year: Never true    Ran Out of Food in the Last Year: Never true  Transportation Needs: No Transportation Needs (06/17/2023)   PRAPARE - Administrator, Civil Service (Medical): No    Lack of Transportation (Non-Medical): No  Physical Activity: Not on file  Stress: Not on file  Social Connections: Socially Integrated (06/17/2023)   Social Connection and Isolation Panel [NHANES]    Frequency of Communication with Friends and Family: Twice a week    Frequency of Social Gatherings with Friends and Family: Twice a week    Attends Religious Services: 1 to 4 times per year    Active Member of [employer] or Organizations: Yes    Attends Banker Meetings: Never    Marital Status: Married  Catering manager Violence: Not At Risk (06/17/2023)   Humiliation, Afraid, Rape, and Kick questionnaire    Fear of Current or Ex-Partner: No    Emotionally  Abused: No    Physically Abused: No    Sexually Abused: No     History reviewed. No pertinent family history.  ROS: Otherwise negative unless mentioned in HPI  Physical Examination  Vitals:   06/18/23 0911 06/18/23 1159  BP:  111/74  Pulse: 75 71  Resp: 18 20  Temp:  98.7 F (37.1 C)  SpO2: 96% 96%   Body mass index is 29.42 kg/m.  General:  WDWN in NAD Gait: Not observed HENT: WNL, normocephalic Pulmonary: normal non-labored breathing, without Rales, rhonchi,  wheezing Cardiac: regular Abdomen:  soft, NT/ND, no masses Skin: without rashes Vascular Exam/Pulses: Palpable right AT pulse Extremities: Cellulitis of the right leg extending up to the proximal shin; dressing left in place on right Achilles wound however  picture was added below; L groin without hematoma Musculoskeletal: no muscle wasting or atrophy  Neurologic: A&O X 3;  No focal weakness or paresthesias are detected; speech is fluent/normal Psychiatric:  The pt has Normal affect. Lymph:  Unremarkable    CBC    Component Value Date/Time   WBC 18.8 (H) 06/18/2023 0449   RBC 4.10 (L) 06/18/2023 0449   HGB 11.6 (L) 06/18/2023 0449   HGB 13.4 09/08/2021 1027   HCT 35.9 (L) 06/18/2023 0449   HCT 41.2 09/08/2021 1027   PLT 160 06/18/2023 0449   PLT 270 09/08/2021 1027   MCV 87.6 06/18/2023 0449   MCV 85 09/08/2021 1027   MCH 28.3 06/18/2023 0449   MCHC 32.3 06/18/2023 0449   RDW 15.7 (H) 06/18/2023 0449   RDW 14.1 09/08/2021 1027   LYMPHSABS 1.2 06/17/2023 0430   MONOABS 1.2 (H) 06/17/2023 0430   EOSABS 0.0 06/17/2023 0430   BASOSABS 0.0 06/17/2023 0430    BMET    Component Value Date/Time   NA 135 06/18/2023 0449   NA 142 09/08/2021 1027   K 3.7 06/18/2023 0449   CL 99 06/18/2023 0449   CO2 23 06/18/2023 0449   GLUCOSE 135 (H) 06/18/2023 0449   BUN 20 06/18/2023 0449   BUN 23 09/08/2021 1027   CREATININE 1.21 06/18/2023 0449   CALCIUM 8.3 (L) 06/18/2023 0449   GFRNONAA 58 (L) 06/18/2023  0449   GFRAA 67 07/14/2019 1020    COAGS: Lab Results  Component Value Date   INR 1.6 (H) 06/18/2023   INR 2.0 (H) 06/17/2023   INR 1.3 (H) 07/16/2022     Non-Invasive Vascular Imaging:   Duplex pending   ASSESSMENT/PLAN: This is a 89 y.o. male with nonhealing right Achilles wound now with cellulitis and group A streptococcus bacteremia  Mr. Vere Diantonio is an 89 year old male status post right lower extremity angiogram with lithotripsy and drug-coated balloon angioplasty of the distal SFA and popliteal artery by Dr. Hetty Blend 1 month ago.  He unfortunately developed cellulitis despite ongoing wound care at the wound clinic.  Patient however subjectively believes circulation has improved in his right foot.  He does not have rest pain.  On exam he has a palpable right ATA pulse.  We will confirm patency of recent intervention with right lower extremity arterial duplex.  He will not tolerate ABI given the location of his wound and current cellulitis.  He will need management of right lower extremity edema with leg elevation as well as ongoing IV antibiotics per ID.  On-call vascular surgeon Dr. Myra Gianotti will evaluate the patient later today and provide further treatment plans.   Emilie Rutter PA-C [facility] [phone]  I agree with the above.  I had an extensive conversation with the patient and his wife.  Will follow up after duplex results  Durene Cal

## 2023-06-18 NOTE — Progress Notes (Signed)
   06/17/23 2308  Wound / Incision (Open or Dehisced) 06/17/23 Tibial Distal;Posterior;Right  Date First Assessed/Time First Assessed: 06/17/23 1321   Location: Tibial  Location Orientation: Distal;Posterior;Right  Wound Image     Dressing Type Gauze (Comment);Non adherent (wrapped with Kirlex)  Dressing Changed Changed  Dressing Status Old drainage  Dressing Change Frequency Twice a day  Site / Wound Assessment Pink;Painful;Yellow;Granulation tissue  % Wound base Red or Granulating 90%  % Wound base Yellow/Fibrinous Exudate 10%  % Wound base Black/Eschar 0%  % Wound base Other/Granulation Tissue (Comment) 100%  Peri-wound Assessment Pink;Erythema (non-blanchable);Edema  Wound Length (cm) 5.5 cm  Wound Width (cm) 3 cm  Wound Depth (cm) 0.3 cm  Wound Volume (cm^3) 4.95 cm^3  Wound Surface Area (cm^2) 16.5 cm^2  Tunneling (cm) 0  Undermining (cm) 0  Margins Unattached edges (unapproximated)  Closure None  Drainage Amount Moderate  Drainage Description Purulent and malodorously smell.  Non-staged Wound Description Not applicable  Treatment Cleansed;Irrigation   Wound care team consulted is pending.   Filiberto Pinks, RN

## 2023-06-18 NOTE — Consult Note (Addendum)
 I have seen and examined the patient. I have personally reviewed the clinical findings, laboratory findings, microbiological data and imaging studies. The assessment and treatment plan was discussed with the Nurse Practitioner. I agree with her/his recommendations except following additions/corrections.  89 year old male with prior history of prostate cancer, pernicious anemia, HTN, HLD, A-fib on AC, vitamin B12 deficiency, PAD s/p vascular intervention who presented to the ED on 4/3 with subjective fever, chills, pain, redness and swelling of right leg  for 1 day in the setting of chronic Rt heel wound followed by wound care center.  No reported nausea, vomiting, diarrhea or chest pain or shortness of breath.    At ED, clinical presentation with fevers/sepsis and was started on IVF. Labs remarkable for NA 134,, AKI with creatinine 1.42, albumin 3.4, TB 1.3, lactic acid 2.8, WBC 18.9  Exam - elderly male sitting in the recliner, with legs elevated, HEENT Wnl, heart, lung and abdomen wnl. Rt leg edematous and swollen with erythema, warmth and tenderness, no signs of nec fasc, chronic rt leg wound with no purulence, some slough. Left leg with no concerns    Blood cx with GPC in both sets, BCID as strep . Uncommon casue of endocarditis   Plan Continue Pen G + at least 72 hrs of linezolid for antitoxin effect 2 sets of repeat blood cultures  Keep rt leg elevated  Duplex of rt leg to r/o DVT Monitor CBC and CMP Universal/standard isolation precautions  Dr Drue Second covering this weekend, New ID team starting Monday.   I have personally spent 80 minutes involved in face-to-face and non-face-to-face activities for this patient on the day of the visit. Professional time spent includes the following activities: Preparing to see the patient (review of tests), Obtaining and/or reviewing separately obtained history (admission/discharge record), Performing a medically appropriate examination and/or  evaluation , Ordering medications/tests/procedures, referring and communicating with other health care professionals, Documenting clinical information in the EMR, Independently interpreting results (not separately reported), Communicating results to the patient/family/caregiver, Counseling and educating the patient/family/caregiver and Care coordination (not separately reported).      [facility]    Date of Admission:  06/17/2023     Total days of antibiotics 3               Reason for Consult: Group A streptococcus bacteremia  Referring Provider: [employer] Primary Care Provider: Garlan Fillers, MD   ASSESSMENT:  Logan French is an 89 year old Caucasian male admitted with right lower extremity swelling in the setting of chronic wound and found to have group A streptococcus bacteremia with leg infection.  No fluid collections or necrotizing process noted. No surgical interventions indicated at this time.  Previous revascularization with vascular surgery.  Discussed plan of care to continue with penicillin G and addition of linezolid for antitoxin effects.  Continue to keep leg elevated and monitor for signs of worsening.  Check blood cultures for clearance of bacteremia.  TTE pending. Universal/standard precautions.  Remaining medical and supportive care per internal medicine.  PLAN:  Continue current dose of penicillin and linezolid. Elevate leg. Check blood cultures for clearance of bacteremia. Wound care per wound RN recommendations. Standard/universal precautions. Remaining medical and supportive care per internal medicine.   Principal Problem:   Cellulitis of right lower extremity Active Problems:   Streptococcal bacteremia    albuterol  2.5 mg Nebulization Q6H   docusate sodium  100 mg Oral BID   ezetimibe  10 mg Oral Daily  finasteride  5 mg Oral Daily   insulin aspart  0-9 Units Subcutaneous TID WC   metoprolol succinate  25 mg Oral  Q12H   mupirocin ointment   Topical Daily   polyethylene glycol  17 g Oral Daily   rosuvastatin  20 mg Oral Daily   LIBREXIA-AF apixaban or placebo  5 mg Oral BID   LIBREXIA-AF [id number] (Milvexian) or placebo  1 tablet Oral BID     HPI: Logan French is a 89 y.o. male with previous medical history of hypertension, atrial fibrillation on anticoagulation, prostate cancer, and peripheral vascular disease status post angioplasty of the right popliteal artery presenting from home with a chief complaint of fever and increased right leg swelling and pain.  Logan French has had a chronic wound on his right lower extremity going on since October 2020 for and has been followed by wound care.  Reported to the ED with fever, chills, and pain and redness of his right lower extremity.  Febrile upon admission with temperature of 103 F and leukocytosis with white blood cell count of 18,900.  CT tibia/fibula with extensive subcutaneous edema of the right lower extremity with no loculated subcutaneous fluid collections.  Initially started on ceftriaxone and received vancomycin, and metronidazole.  Antibiotics have been narrowed to penicillin with blood cultures positive for group A streptococcus bacteremia.  Linezolid added.  Logan French continues to have pain located in his right posterior lower extremity with swelling and redness.  Pain is mildly improved with pain medication.  Feeling better since admission.  Has had chronic wound ongoing since Halloween of last year.  Noticed worsening recently.   Review of Systems: Review of Systems  Constitutional:  Positive for malaise/fatigue. Negative for chills, fever and weight loss.  Respiratory:  Negative for cough, shortness of breath and wheezing.   Cardiovascular:  Positive for leg swelling. Negative for chest pain.  Gastrointestinal:  Negative for abdominal pain, constipation, diarrhea, nausea and vomiting.  Skin:  Negative for rash.     Past Medical  History:  Diagnosis Date   A-fib (HCC)    B12 deficiency    Diverticulosis    Fatigue    History of colonic polyps    Hyperlipemia    Hypertension    Pernicious anemia    Prostate cancer (HCC)    Syncope    Syncope    Past Surgical History:  Procedure Laterality Date   ABDOMINAL AORTOGRAM W/LOWER EXTREMITY N/A 05/17/2023   Procedure: ABDOMINAL AORTOGRAM W/LOWER EXTREMITY;  Surgeon: [name], MD;  Location[facility] INVASIVE CV LAB;  Service: Cardiovascular;  Laterality: N/A;   CARDIOVERSION N/A 10/07/2021   Procedure: CARDIOVERSION;  Surgeon: Thomasene Ripple, DO;  Location: [facility] ENDOSCOPY;  Service: Cardiovascular;  Laterality: N/A;   IR ANGIO INTRA EXTRACRAN SEL COM CAROTID INNOMINATE BILAT MOD SED  09/10/2017   IR ANGIO VERTEBRAL SEL SUBCLAVIAN INNOMINATE BILAT MOD SED  09/10/2017   PERIPHERAL INTRAVASCULAR LITHOTRIPSY Right 05/17/2023   Procedure: PERIPHERAL INTRAVASCULAR LITHOTRIPSY;  Surgeon: [name], MD;  Location: [facility] INVASIVE CV LAB;  Service: Cardiovascular;  Laterality: Right;  SFA-POP   PERIPHERAL VASCULAR BALLOON ANGIOPLASTY Right 05/17/2023   Procedure: PERIPHERAL VASCULAR BALLOON ANGIOPLASTY;  Surgeon: [name], MD;  Location: [facility] INVASIVE CV LAB;  Service: Cardiovascular;  Laterality: Right;  SFA-POP     Social History   Tobacco Use   Smoking status: Never   Smokeless tobacco: Never   Tobacco comments:    Never smoke 09/23/21  Vaping Use   Vaping status: Never Used  Substance Use Topics   Alcohol use: Yes    Alcohol/week: 2.0 standard drinks of alcohol    Types: 1 Cans of beer, 1 Shots of liquor per week    Comment: occasionally   Drug use: No    History reviewed. No pertinent family history.  No Known Allergies  OBJECTIVE: Blood pressure 125/88, pulse 75, temperature 99.9 F (37.7 C), temperature source Oral, resp. rate 18, height 6' (1.829 m), weight 98.4 kg, SpO2 96%.  Physical Exam Constitutional:      General: He is not in acute  distress.    Appearance: He is well-developed.     Comments: Seated in the chair next to the bed with legs elevated; pleasant  Cardiovascular:     Rate and Rhythm: Normal rate and regular rhythm.     Heart sounds: Normal heart sounds.  Pulmonary:     Effort: Pulmonary effort is normal.     Breath sounds: Normal breath sounds.  Skin:    General: Skin is warm and dry.     Comments: Right lower extremity with redness and swelling; wound wrapped with bandage.  Neurological:     Mental Status: He is alert.     Lab Results Lab Results  Component Value Date   WBC 18.8 (H) 06/18/2023   HGB 11.6 (L) 06/18/2023   HCT 35.9 (L) 06/18/2023   MCV 87.6 06/18/2023   PLT 160 06/18/2023    Lab Results  Component Value Date   CREATININE 1.21 06/18/2023   BUN 20 06/18/2023   NA 135 06/18/2023   K 3.7 06/18/2023   CL 99 06/18/2023   CO2 23 06/18/2023    Lab Results  Component Value Date   ALT 19 06/18/2023   AST 34 06/18/2023   ALKPHOS 56 06/18/2023   BILITOT 1.5 (H) 06/18/2023     Microbiology: Recent Results (from the past 240 hours)  Blood Culture (routine x 2)     Status: Abnormal (Preliminary result)   Collection Time: 06/17/23  4:03 AM   Specimen: BLOOD RIGHT ARM  Result Value Ref Range Status   Specimen Description   Final    BLOOD RIGHT ARM Performed at [facility], [address] [place], [place] [address]    Special Requests   Final    BOTTLES DRAWN AEROBIC AND ANAEROBIC Blood Culture adequate volume Performed at [facility], [address] [place], [place] [address]    Culture  Setup Time   Final    GRAM POSITIVE COCCI IN CHAINS ANAEROBIC BOTTLE ONLY CRITICAL RESULT CALLED TO, READ BACK BY AND VERIFIED WITH: PHARMD G ABBOTT 06/18/2023 @ 0042 BY AB    Culture (A)  Final    GROUP A STREP (S.PYOGENES) ISOLATED CULTURE REINCUBATED FOR BETTER GROWTH HEALTH DEPARTMENT NOTIFIED Performed at [facility], [address], [place], [place] [address]    Report Status PENDING  Incomplete  Blood Culture ID Panel (Reflexed)     Status: Abnormal   Collection Time: 06/17/23  4:03 AM  Result Value Ref Range Status   Enterococcus faecalis NOT DETECTED NOT DETECTED Final   Enterococcus Faecium NOT DETECTED NOT DETECTED Final   Listeria monocytogenes NOT DETECTED NOT DETECTED Final   Staphylococcus species NOT DETECTED NOT DETECTED Final   Staphylococcus aureus (BCID) NOT DETECTED NOT DETECTED Final   Staphylococcus epidermidis NOT DETECTED NOT DETECTED Final   Staphylococcus lugdunensis NOT DETECTED NOT DETECTED Final  Streptococcus species DETECTED (A) NOT DETECTED Final    Comment: CRITICAL RESULT CALLED TO, READ BACK BY AND VERIFIED WITH: PHARMD G ABBOTT 06/18/2023 @ 0042 BY AB    Streptococcus agalactiae NOT DETECTED NOT DETECTED Final   Streptococcus pneumoniae NOT DETECTED NOT DETECTED Final   Streptococcus pyogenes DETECTED (A) NOT DETECTED Final    Comment: CRITICAL RESULT CALLED TO, READ BACK BY AND VERIFIED WITH: PHARMD G ABBOTT 06/18/2023 @ 0042 BY AB    A.calcoaceticus-baumannii NOT DETECTED NOT DETECTED Final   Bacteroides fragilis NOT DETECTED NOT DETECTED Final   Enterobacterales NOT DETECTED NOT DETECTED Final   Enterobacter cloacae complex NOT DETECTED NOT DETECTED Final   Escherichia coli NOT DETECTED NOT DETECTED Final   Klebsiella aerogenes NOT DETECTED NOT DETECTED Final   Klebsiella oxytoca NOT DETECTED NOT DETECTED Final   Klebsiella pneumoniae NOT DETECTED NOT DETECTED Final   Proteus species NOT DETECTED NOT DETECTED Final   Salmonella species NOT DETECTED NOT DETECTED Final   Serratia marcescens NOT DETECTED NOT DETECTED Final   Haemophilus influenzae NOT DETECTED NOT DETECTED Final   Neisseria meningitidis NOT DETECTED NOT DETECTED Final   Pseudomonas aeruginosa NOT DETECTED NOT DETECTED Final   Stenotrophomonas maltophilia NOT DETECTED NOT DETECTED Final   Candida albicans NOT  DETECTED NOT DETECTED Final   Candida auris NOT DETECTED NOT DETECTED Final   Candida glabrata NOT DETECTED NOT DETECTED Final   Candida krusei NOT DETECTED NOT DETECTED Final   Candida parapsilosis NOT DETECTED NOT DETECTED Final   Candida tropicalis NOT DETECTED NOT DETECTED Final   Cryptococcus neoformans/gattii NOT DETECTED NOT DETECTED Final    Comment: Performed at [facility], [address] [place], [place] [address]  Blood Culture (routine x 2)     Status: None (Preliminary result)   Collection Time: 06/17/23  4:08 AM   Specimen: BLOOD RIGHT HAND  Result Value Ref Range Status   Specimen Description   Final    BLOOD RIGHT HAND Performed at [facility], [address], [place], [place] [address]    Special Requests   Final    BOTTLES DRAWN AEROBIC AND ANAEROBIC Blood Culture adequate volume Performed at [facility], [address] [place], [place] [address]    Culture  Setup Time   Final    GRAM POSITIVE COCCI IN PAIRS IN CHAINS AEROBIC BOTTLE ONLY CRITICAL VALUE NOTED.  VALUE IS CONSISTENT WITH PREVIOUSLY REPORTED AND CALLED VALUE. Performed at [facility], [address] [place], [place] [address]    Culture [facility] POSITIVE COCCI  Final   Report Status PENDING  Incomplete   Imaging ECHOCARDIOGRAM COMPLETE Result Date: 06/18/2023    ECHOCARDIOGRAM REPORT   Patient Name:   MIKAIL GOOSTREE Date of Exam: 06/18/2023 Medical Rec #:  [id number]          Height:       72.0 in Accession #:    [id number]         Weight:       216.9 lb Date of Birth:  10-15-1934          BSA:          2.205 m Patient Age:    88 years           BP:           111/74 mmHg Patient Gender: M  HR:           68 bpm. Exam Location:  Inpatient Procedure: 2D Echo, Cardiac Doppler and Color Doppler (Both Spectral and Color            Flow Doppler were utilized during procedure). Indications:    Bacteremia  History:        Patient has prior  history of Echocardiogram examinations, most                 recent 09/08/2021. Arrythmias:Atrial Fibrillation,                 Signs/Symptoms:Edema, Fatigue, Dizziness/Lightheadedness and                 Syncope; Risk Factors:Hypertension and Dyslipidemia.  Sonographer:    Vern Claude Referring Phys: [id number] Dewayne Shorter M GHIMIRE IMPRESSIONS  1. Left ventricular ejection fraction, by estimation, is 50%. The left ventricle has mildly decreased function. The left ventricle demonstrates regional wall motion abnormalities with suspected basal to mid inferolateral and basal anterolateral hypokinesis. However, endocardial definition was poor. Definity contrast may have helped but was not used. The left ventricular internal cavity size was mildly dilated. Left ventricular diastolic parameters are indeterminate.  2. Right ventricular systolic function is mildly reduced. The right ventricular size is normal.  3. Left atrial size was mildly dilated.  4. The mitral valve is normal in structure. Mild mitral valve regurgitation. No evidence of mitral stenosis.  5. The aortic valve is tricuspid. There is moderate calcification of the aortic valve. Aortic valve regurgitation is trivial. Aortic valve sclerosis/calcification is present, without any evidence of aortic stenosis.  6. Aortic dilatation noted. There is mild dilatation of the aortic root, measuring 39 mm.  7. The IVC was not visualized.  8. No definite valvular vegetation was visualized, but technically difficult study, and if endocarditis is suspected will need TEE. FINDINGS  Left Ventricle: Left ventricular ejection fraction, by estimation, is 50%. The left ventricle has mildly decreased function. The left ventricle demonstrates regional wall motion abnormalities. The left ventricular internal cavity size was mildly dilated. There is no left ventricular hypertrophy. Left ventricular diastolic parameters are indeterminate. Right Ventricle: The right ventricular size is normal.  No increase in right ventricular wall thickness. Right ventricular systolic function is mildly reduced. Left Atrium: Left atrial size was mildly dilated. Right Atrium: Right atrial size was normal in size. Pericardium: There is no evidence of pericardial effusion. Mitral Valve: The mitral valve is normal in structure. There is mild calcification of the mitral valve leaflet(s). Mild mitral annular calcification. Mild mitral valve regurgitation. No evidence of mitral valve stenosis. MV peak gradient, 6.0 mmHg. The mean mitral valve gradient is 1.0 mmHg. Tricuspid Valve: The tricuspid valve is normal in structure. Tricuspid valve regurgitation is mild. Aortic Valve: The aortic valve is tricuspid. There is moderate calcification of the aortic valve. Aortic valve regurgitation is trivial. Aortic valve sclerosis/calcification is present, without any evidence of aortic stenosis. Aortic valve mean gradient measures 4.3 mmHg. Aortic valve peak gradient measures 7.8 mmHg. Aortic valve area, by VTI measures 1.59 cm. Pulmonic Valve: The pulmonic valve was normal in structure. Pulmonic valve regurgitation is trivial. Aorta: Aortic dilatation noted. There is mild dilatation of the aortic root, measuring 39 mm. Venous: The IVC was not visualized. IAS/Shunts: No atrial level shunt detected by color flow Doppler.  LEFT VENTRICLE PLAX 2D LVIDd:         6.30 cm      Diastology LVIDs:  4.00 cm      LV e' medial:    9.79 cm/s LV PW:         0.80 cm      LV E/e' medial:  10.4 LV IVS:        0.90 cm      LV e' lateral:   13.70 cm/s LVOT diam:     2.00 cm      LV E/e' lateral: 7.4 LV SV:         32 LV SV Index:   14 LVOT Area:     3.14 cm  LV Volumes (MOD) LV vol d, MOD A2C: 112.0 ml LV vol d, MOD A4C: 122.0 ml LV vol s, MOD A2C: 44.8 ml LV vol s, MOD A4C: 47.6 ml LV SV MOD A2C:     67.2 ml LV SV MOD A4C:     122.0 ml LV SV MOD BP:      68.8 ml RIGHT VENTRICLE RV Basal diam:  3.40 cm RV Mid diam:    2.30 cm RV S prime:     9.17  cm/s LEFT ATRIUM             Index        RIGHT ATRIUM           Index LA diam:        5.30 cm 2.40 cm/m   RA Area:     16.60 cm LA Vol (A2C):   48.1 ml 21.81 ml/m  RA Volume:   35.00 ml  15.87 ml/m LA Vol (A4C):   53.6 ml 24.30 ml/m LA Biplane Vol: 54.3 ml 24.62 ml/m  AORTIC VALVE                    PULMONIC VALVE AV Area (Vmax):    1.46 cm     PV Vmax:       0.88 m/s AV Area (Vmean):   1.29 cm     PV Peak grad:  3.1 mmHg AV Area (VTI):     1.59 cm AV Vmax:           139.33 cm/s AV Vmean:          93.500 cm/s AV VTI:            0.200 m AV Peak Grad:      7.8 mmHg AV Mean Grad:      4.3 mmHg LVOT Vmax:         64.90 cm/s LVOT Vmean:        38.500 cm/s LVOT VTI:          0.101 m LVOT/AV VTI ratio: 0.51  AORTA Ao Root diam: 3.90 cm Ao Asc diam:  3.30 cm MITRAL VALVE                TRICUSPID VALVE MV Area (PHT): 3.66 cm     TR Peak grad:   24.4 mmHg MV Area VTI:   1.50 cm     TR Vmax:        247.00 cm/s MV Peak grad:  6.0 mmHg MV Mean grad:  1.0 mmHg     SHUNTS MV Vmax:       1.22 m/s     Systemic VTI:  0.10 m MV Vmean:      50.1 cm/s    Systemic Diam: 2.00 cm MV Decel Time: 207 msec MR Peak grad: 105.5 mmHg MR Mean grad: 69.0 mmHg MR Vmax:  513.50 cm/s MR Vmean:     388.5 cm/s MV E velocity: 102.00 cm/s Dalton McleanMD Electronically signed by Wilfred Lacy Signature Date/Time: 06/18/2023/3:10:23 PM    Final    CT TIBIA FIBULA RIGHT WO CONTRAST Result Date: 06/17/2023 CLINICAL DATA:  right leg wound and cellulitis. EXAM: CT OF THE LOWER RIGHT EXTREMITY WITHOUT CONTRAST TECHNIQUE: Multidetector CT imaging of the right lower extremity was performed according to the standard protocol. RADIATION DOSE REDUCTION: This exam was performed according to the departmental dose-optimization program which includes automated exposure control, adjustment of the mA and/or kV according to patient size and/or use of iterative reconstruction technique. COMPARISON:  None Available. FINDINGS: Bones/Joint/Cartilage Normal  alignment. No acute fracture or dislocation. 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 No osseous erosions. Ligaments Suboptimally assessed by CT. Muscles and Tendons There is marked fatty atrophy of the visualized skeletal musculature,. There is thickening and intrasubstance calcification of the mid to distal Achilles tendon, likely the sequela of remote inflammation or trauma. No intramuscular fluid collections or intrafascial edema. Soft tissues There is extensive subcutaneous edema of the right lower extremity asymmetrically more severe along the anterolateral aspect. No loculated subcutaneous fluid collections. The edematous changes appear superficial to the investing muscular fascia. Small right knee effusion is partially visualized. IMPRESSION: 1. Extensive subcutaneous edema of the right lower extremity asymmetrically more severe along the anterolateral aspect. No loculated subcutaneous fluid collections. The edematous changes appear superficial to the investing muscular fascia. 2. No acute fracture or dislocation. No osseous erosions. 3. 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 4. 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 5. Marked fatty atrophy of the visualized skeletal musculature. 6. Thickening and intrasubstance calcification of the mid to distal Achilles tendon, likely the sequela of remote inflammation or trauma. Electronically Signed   By: Helyn Numbers M.D.   On: 06/17/2023 21:30   DG Chest Port 1 View Result Date: 06/17/2023 CLINICAL DATA:  Questionable sepsis, presented for evaluation fevers at home. EXAM: PORTABLE CHEST 1 VIEW COMPARISON:  None Available. FINDINGS: There is a mildly low inspiration. There is linear atelectasis in lung bases. No focal pneumonia is evident.  There is no substantial pleural effusion. There is mild cardiomegaly, without evidence of CHF. The mediastinum is normally outlined. There is calcification in the transverse aorta. Moderate thoracic spondylosis. 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 IMPRESSION: 1. Low inspiration with linear atelectasis in lung bases. No focal pneumonia is evident. 2. Mild cardiomegaly without evidence of CHF. 3. Aortic atherosclerosis. Electronically Signed   By: Almira Bar M.D.   On: 06/17/2023 04:52     I have personally spent 32 minutes involved in face-to-face and non-face-to-face activities for this patient on the day of the visit. Professional time spent includes the following activities: Preparing to see the patient (review of tests), Obtaining and/or reviewing separately obtained history (admission/discharge record), Performing a medically appropriate examination and/or evaluation , Ordering medications/tests/procedures, referring and communicating with other health care professionals, Documenting clinical information in the EMR, Independently interpreting results (not separately reported), Communicating results to the patient/family/caregiver, Counseling and educating the patient/family/caregiver and Care coordination (not separately reported).   Marcos Eke, NP [facility]  06/18/2023  11:56 AM

## 2023-06-18 NOTE — Plan of Care (Signed)
 Plan of care is reviewed. Pt is stable hemodynamically, Afib on the monitor, BP stable, normal respiratory effort. No acute distress noted.    Pt has low grade of fever, max temp is 100 degree F toning. Right leg cellulitis with open wound, with purulent foul smell drainage, dressing changed. Pain is well tolerated. We will continue to monitor.  Problem: Clinical Measurements: Goal: Ability to maintain clinical measurements within normal limits will improve Outcome: Progressing Goal: Will remain free from infection Outcome: Progressing Goal: Diagnostic test results will improve Outcome: Progressing Goal: Respiratory complications will improve Outcome: Progressing Goal: Cardiovascular complication will be avoided Outcome: Progressing   Problem: Activity: Goal: Risk for activity intolerance will decrease Outcome: Progressing   Problem: Nutrition: Goal: Adequate nutrition will be maintained Outcome: Progressing   Problem: Elimination: Goal: Will not experience complications related to bowel motility Outcome: Progressing Goal: Will not experience complications related to urinary retention Outcome: Progressing   Problem: Pain Managment: Goal: General experience of comfort will improve and/or be controlled Outcome: Progressing   Problem: Skin Integrity: Goal: Risk for impaired skin integrity will decrease Outcome: Progressing   Problem: Fluid Volume: Goal: Hemodynamic stability will improve Outcome: Progressing   Problem: Clinical Measurements: Goal: Diagnostic test results will improve Outcome: Progressing Goal: Signs and symptoms of infection will decrease Outcome: Progressing   Problem: Respiratory: Goal: Ability to maintain adequate ventilation will improve Outcome: Progressing  Filiberto Pinks, RN

## 2023-06-18 NOTE — Progress Notes (Incomplete)
   06/17/23 2308  Wound / Incision (Open or Dehisced) 06/17/23 Tibial Distal;Posterior;Right  Date First Assessed/Time First Assessed: 06/17/23 1321   Location: Tibial  Location Orientation: Distal;Posterior;Right  Wound Image     Dressing Type Gauze (Comment);Non adherent (wrapped with Kirlex)  Dressing Changed Changed  Dressing Status Old drainage  Dressing Change Frequency Twice a day  Site / Wound Assessment Pink;Painful;Yellow;Granulation tissue  % Wound base Red or Granulating 90%  % Wound base Yellow/Fibrinous Exudate 10%  % Wound base Black/Eschar 0%  % Wound base Other/Granulation Tissue (Comment) 100%  Peri-wound Assessment Pink;Erythema (non-blanchable);Edema  Wound Length (cm) 5.5 cm  Wound Width (cm) 3 cm  Wound Depth (cm) 0.3 cm  Wound Volume (cm^3) 4.95 cm^3  Wound Surface Area (cm^2) 16.5 cm^2  Tunneling (cm) 0  Undermining (cm) 0  Margins Unattached edges (unapproximated)  Closure None  Drainage Amount Moderate  Drainage Description Purulent  Non-staged Wound Description Not applicable  Treatment Cleansed;Irrigation     Filiberto Pinks, RN

## 2023-06-18 NOTE — Consult Note (Signed)
 WOC Nurse Consult Note: patient followed at [facility] for R leg wound r/t venous insufficiency; has been admitted for cellulitis of this leg; last visit at [facility] patient switched to Bactroban ointment and  Prisma (a collagen dressing not on formulary at [facility]); will use Aquacel AG as substitute  Reason for Consult: R lower leg wound  Wound type: full thickness r/t venous insufficiency  Pressure Injury POA: NA  Measurement: see nursing flowsheet  Wound bed:75% tan yellow 25% pink  Drainage (amount, consistency, odor) see nursing flowsheet  Periwound: edema, erythema, hyperkeratotic skin  Dressing procedure/placement/frequency: Cleanse R lower leg (intact skin and wound) with Vashe wound cleanser, do not rinse and allow to air dry.  Apply Bactroban to wound bed daily, cover with silver hydrofiber [place] [phone]) cut to fit wound bed, cover with dry gauze/ABD pad.  Secure dressing with Kerlix roll gauze beginning right above toes and ending right below knee.  Apply Ace bandage wrapped in same fashion as Kerlix for light compression. Patient should elevate leg as much as possible.   POC discussed with bedside nurse. WOC team will not follow. Re-consult if further needs arise.   Thank you,    Priscella Mann MSN, RN-BC, [employer] [phone]

## 2023-06-18 NOTE — Progress Notes (Signed)
  Inpatient Rehab Admissions Coordinator :  Per therapy recommendations, patient was screened for CIR candidacy by Ottie Glazier RN MSN.  At this time patient appears to be a potential candidate for CIR. I will place a rehab consult per protocol for full assessment. Please call me with any questions.  Ottie Glazier RN MSN Admissions Coordinator [phone]

## 2023-06-18 NOTE — Progress Notes (Signed)
 PHARMACY NOTE:  ANTIMICROBIAL RENAL DOSAGE ADJUSTMENT  Current antimicrobial regimen includes a mismatch between antimicrobial dosage and estimated renal function.  As per policy approved by the Pharmacy & Therapeutics and Medical Executive Committees, the antimicrobial dosage will be adjusted accordingly.  Current antimicrobial dosage:  Penicillin 4 MU every 6 hours  Indication: GAS bacteremia  Renal Function:  Estimated Creatinine Clearance: 53.2 mL/min (by C-G formula based on SCr of 1.21 mg/dL). []      On intermittent HD, scheduled: []      On CRRT    Antimicrobial dosage has been changed to: Penicillin 18 MU daily as a continuous infusion  Additional comments:   Thank you for allowing pharmacy to be a part of this patient's care.  Georgina Pillion, PharmD, BCPS, BCIDP Infectious Diseases Clinical Pharmacist 06/18/2023 8:49 AM   **Pharmacist phone directory can now be found on [email or web address] ([email or web address]).  Listed under [facility].

## 2023-06-19 ENCOUNTER — Inpatient Hospital Stay (HOSPITAL_COMMUNITY)

## 2023-06-19 DIAGNOSIS — L03115 Cellulitis of right lower limb: Secondary | ICD-10-CM | POA: Diagnosis not present

## 2023-06-19 DIAGNOSIS — I7090 Unspecified atherosclerosis: Secondary | ICD-10-CM | POA: Diagnosis not present

## 2023-06-19 DIAGNOSIS — A419 Sepsis, unspecified organism: Secondary | ICD-10-CM | POA: Diagnosis not present

## 2023-06-19 DIAGNOSIS — M7989 Other specified soft tissue disorders: Secondary | ICD-10-CM

## 2023-06-19 DIAGNOSIS — I10 Essential (primary) hypertension: Secondary | ICD-10-CM | POA: Diagnosis not present

## 2023-06-19 DIAGNOSIS — I4819 Other persistent atrial fibrillation: Secondary | ICD-10-CM | POA: Diagnosis not present

## 2023-06-19 LAB — CBC WITH DIFFERENTIAL/PLATELET
Abs Immature Granulocytes: 0.23 10*3/uL — ABNORMAL HIGH (ref 0.00–0.07)
Basophils Absolute: 0.1 10*3/uL (ref 0.0–0.1)
Basophils Relative: 0 %
Eosinophils Absolute: 0.1 10*3/uL (ref 0.0–0.5)
Eosinophils Relative: 1 %
HCT: 34.5 % — ABNORMAL LOW (ref 39.0–52.0)
Hemoglobin: 11.4 g/dL — ABNORMAL LOW (ref 13.0–17.0)
Immature Granulocytes: 1 %
Lymphocytes Relative: 7 %
Lymphs Abs: 1.3 10*3/uL (ref 0.7–4.0)
MCH: 28.5 pg (ref 26.0–34.0)
MCHC: 33 g/dL (ref 30.0–36.0)
MCV: 86.3 fL (ref 80.0–100.0)
Monocytes Absolute: 1.1 10*3/uL — ABNORMAL HIGH (ref 0.1–1.0)
Monocytes Relative: 6 %
Neutro Abs: 15.7 10*3/uL — ABNORMAL HIGH (ref 1.7–7.7)
Neutrophils Relative %: 85 %
Platelets: 191 10*3/uL (ref 150–400)
RBC: 4 MIL/uL — ABNORMAL LOW (ref 4.22–5.81)
RDW: 15.4 % (ref 11.5–15.5)
WBC: 18.5 10*3/uL — ABNORMAL HIGH (ref 4.0–10.5)
nRBC: 0 % (ref 0.0–0.2)

## 2023-06-19 LAB — GLUCOSE, CAPILLARY
Glucose-Capillary: 137 mg/dL — ABNORMAL HIGH (ref 70–99)
Glucose-Capillary: 224 mg/dL — ABNORMAL HIGH (ref 70–99)

## 2023-06-19 LAB — BASIC METABOLIC PANEL WITH GFR
Anion gap: 11 (ref 5–15)
BUN: 15 mg/dL (ref 8–23)
CO2: 23 mmol/L (ref 22–32)
Calcium: 8.1 mg/dL — ABNORMAL LOW (ref 8.9–10.3)
Chloride: 98 mmol/L (ref 98–111)
Creatinine, Ser: 1.03 mg/dL (ref 0.61–1.24)
GFR, Estimated: >60 mL/min (ref >60)
Glucose, Bld: 129 mg/dL — ABNORMAL HIGH (ref 70–99)
Potassium: 3.5 mmol/L (ref 3.5–5.1)
Sodium: 132 mmol/L — ABNORMAL LOW (ref 135–145)

## 2023-06-19 LAB — VAS US ABI WITH/WO TBI: Left ABI: 0.98

## 2023-06-19 LAB — PROCALCITONIN: Procalcitonin: 3.88 ng/mL

## 2023-06-19 MED ORDER — ORAL CARE MOUTH RINSE: 15.0000 mL | OROMUCOSAL | Status: DC | PRN

## 2023-06-19 NOTE — Progress Notes (Signed)
 VASCULAR LAB    Right lower extremity arterial duplex has been performed.  See CV proc for preliminary results.   Grayden Burley, RVT 06/19/2023, 11:55 AM

## 2023-06-19 NOTE — Progress Notes (Signed)
 VASCULAR LAB    ABI has been performed.  See CV proc for preliminary results.   Daymon Hora, RVT 06/19/2023, 11:54 AM

## 2023-06-19 NOTE — Progress Notes (Signed)
 Occupational Therapy Treatment Patient Details Name: Logan French Current MRN: [id number] DOB: 1934/10/04 Today's Date: 06/19/2023   History of present illness Pt is an 89 y/o male admitted for RLE cellulitis and nonhealing wound. +for bacetermia. PMH: prostate cancer, anemia, HTN, HLD, a fib, b12 deficiency, PAD s/p vascular intervention.   OT comments  Patient requiring increased assistance on this date. Patient stating increased pain but willing to participate with OT treatment and get OOB. Patient required increased time and max assist +2 to get to EOB due to assistance  with BLEs and trunk. Patient requiring increased time and max assist +2 to stand from EOB and step pivot transfer to recliner with cues for hand placement. Patient was found to have had bowel incontinence during transfer and stood from recliner with max assist +2 and max assist for cleaning while standing. Patient stated increased comfort once in recliner. Patient will benefit from intensive inpatient follow-up therapy, >3 hours/day. Acute OT to continue to follow to address established goals to facilitate DC to next venue of care.       If plan is discharge home, recommend the following:  A lot of help with walking and/or transfers;A lot of help with bathing/dressing/bathroom;Assistance with cooking/housework;Help with stairs or ramp for entrance;Assist for transportation   Equipment Recommendations  Other (comment) (RW TBD)    Recommendations for Other Services Rehab consult    Precautions / Restrictions Precautions Precautions: Fall Restrictions Weight Bearing Restrictions Per Provider Order: No       Mobility Bed Mobility Overal bed mobility: Needs Assistance Bed Mobility: Supine to Sit     Supine to sit: Max assist, +2 for physical assistance, HOB elevated     General bed mobility comments: patient requiring assistance with BLEs and trunk to get to EOB    Transfers Overall transfer level: Needs  assistance Equipment used: Rolling walker (2 wheels) Transfers: Sit to/from Stand, Bed to chair/wheelchair/BSC Sit to Stand: Max assist, +2 physical assistance     Step pivot transfers: Max assist, +2 physical assistance     General transfer comment: patient requiring max assist +2 to stand from EOB and recliner with use of bed pad to lift hips. increased time for step pivot transfer, cues for hand placement     Balance Overall balance assessment: Needs assistance Sitting-balance support: No upper extremity supported, Feet supported Sitting balance-Leahy Scale: Fair     Standing balance support: Single extremity supported, Bilateral upper extremity supported, During functional activity Standing balance-Leahy Scale: Poor Standing balance comment: reliantn on BUE support when standing                           ADL either performed or assessed with clinical judgement   ADL Overall ADL's : Needs assistance/impaired Eating/Feeding: Independent   Grooming: Set up;Sitting       Lower Body Bathing: Maximal assistance;Sit to/from stand Lower Body Bathing Details (indicate cue type and reason): cleaning performed after bowel incontenance Upper Body Dressing : Set up;Sitting Upper Body Dressing Details (indicate cue type and reason): gown Lower Body Dressing: Total assistance Lower Body Dressing Details (indicate cue type and reason): assisted for socks Toilet Transfer: Maximal assistance;+2 for physical assistance Toilet Transfer Details (indicate cue type and reason): simulated to recliner Toileting- Clothing Manipulation and Hygiene: Maximal assistance         General ADL Comments: Patient requiring increased assistance with self care due to pain    Extremity/Trunk Assessment  Vision       Perception     Praxis     Communication Communication Communication: No apparent difficulties   Cognition Arousal: Alert Behavior During Therapy: WFL  for tasks assessed/performed Cognition: No apparent impairments                               Following commands: Intact        Cueing   Cueing Techniques: Verbal cues, Gestural cues  Exercises      Shoulder Instructions       General Comments Patient with increased pain during mobility    Pertinent Vitals/ Pain       Pain Assessment Pain Assessment: Faces Faces Pain Scale: Hurts whole lot Pain Location: R calf, generalized Pain Descriptors / Indicators: Grimacing, Guarding, Aching, Sore Pain Intervention(s): Limited activity within patient's tolerance, Monitored during session, Premedicated before session, Repositioned  Home Living                                          Prior Functioning/Environment              Frequency  Min 2X/week        Progress Toward Goals  OT Goals(current goals can now be found in the care plan section)  Progress towards OT goals: Progressing toward goals  Acute Rehab OT Goals Patient Stated Goal: decreased pain OT Goal Formulation: With patient Time For Goal Achievement: 07/02/23 Potential to Achieve Goals: Good ADL Goals Pt Will Perform Lower Body Bathing: with modified independence;sit to/from stand;sitting/lateral leans Pt Will Perform Lower Body Dressing: with modified independence;sit to/from stand;sitting/lateral leans Pt Will Transfer to Toilet: with modified independence;ambulating  Plan      Co-evaluation                 AM-PAC OT "6 Clicks" Daily Activity     Outcome Measure   Help from another person eating meals?: None Help from another person taking care of personal grooming?: A Little Help from another person toileting, which includes using toliet, bedpan, or urinal?: A Lot Help from another person bathing (including washing, rinsing, drying)?: A Lot Help from another person to put on and taking off regular upper body clothing?: A Lot Help from another person to put  on and taking off regular lower body clothing?: A Lot 6 Click Score: 15    End of Session Equipment Utilized During Treatment: Gait belt;Rolling walker (2 wheels)  OT Visit Diagnosis: Other abnormalities of gait and mobility (R26.89);Unsteadiness on feet (R26.81)   Activity Tolerance Patient limited by pain   Patient Left in chair;with call bell/phone within reach;with chair alarm set;with family/visitor present   Nurse Communication Mobility status;Other (comment) (patient requiring increased assistance with transfers)        Time: 1341-1416 OT Time Calculation (min): 35 min  Charges: OT General Charges $OT Visit: 1 Visit OT Treatments $Self Care/Home Management : 23-37 mins  Alfonse Flavors, OTA Acute Rehabilitation Services  Office [phone]   Dewain Penning 06/19/2023, 2:47 PM

## 2023-06-19 NOTE — Progress Notes (Signed)
 Pt reports no pulmonary or smoking hx. BBS essn clear. No indication for bronchodilators. D/C per RT assessment

## 2023-06-19 NOTE — Progress Notes (Signed)
 Inpatient Rehab Admissions Coordinator:  Attempted to see pt to discuss CIR. Pt was out of room for procedure. Will f/u at later date.   Wolfgang Phoenix, MS, CCC-SLP Admissions Coordinator [phone]

## 2023-06-19 NOTE — Progress Notes (Signed)
 PROGRESS NOTE        PATIENT DETAILS Name: Logan French Age: 89 y.o. Sex: male Date of Birth: [birth year] Admit Date: 06/17/2023 Admitting Physician Briscoe Deutscher, MD [id number], Barry Dienes, MD  Brief Summary: Patient is a 89 y.o.  male with history of chronic right leg wound-followed by wound care-s/p recent PCI to popliteal artery-presented with severe RLE pain/swelling/erythema following a cat scratch around 3/31.  Patient was found to have sepsis secondary to RLE cellulitis with streptococcal bacteremia and admitted to the hospitalist service.  Significant events: 4/3>> admitted to [facility]  Significant studies: 4/3>> CT right fibula/tibia: Extensive subcutaneous edema-no fluid collections-edematous changes appears superficial to muscular fascia. 4/4>> echo: EF 50%-no obvious vegetation.   Significant microbiology data: 4/3>> blood culture: Group A strep 4/4>> blood culture: No growth  Procedures: None  Consults: ID VVS  Subjective: RLE pain unchanged-still with some erythema.  Appears comfortable.  Objective: Vitals: Blood pressure 121/75, pulse 75, temperature 99.3 F (37.4 C), temperature source Oral, resp. rate 17, height 6' (1.829 m), weight 105.4 kg, SpO2 96%.   Exam: Gen Exam:Alert awake-not in any distress HEENT:atraumatic, normocephalic Chest: B/L clear to auscultation anteriorly CVS:S1S2 regular Abdomen:soft non tender, non distended Extremities: RLE with diffuse erythema and tenderness. Neurology: Non focal Skin: no rash  Pertinent Labs/Radiology:    Latest Ref Rng & Units 06/19/2023    5:58 AM 06/18/2023    4:49 AM 06/17/2023    4:30 AM  CBC  WBC 4.0 - 10.5 K/uL 18.5  18.8  18.9   Hemoglobin 13.0 - 17.0 g/dL 38.8  82.8  00.3   Hematocrit 39.0 - 52.0 % 34.5  35.9  37.0   Platelets 150 - 400 K/uL 191  160  194     Lab Results  Component Value Date   NA 132 (L) 06/19/2023   K 3.5 06/19/2023   CL 98 06/19/2023   CO2 23  06/19/2023      Assessment/Plan: Sepsis secondary to RLE cellulitis and streptococcal bacteremia in a setting of cat scratch Essentially unchanged-continues to have persistent RLE pain/erythema and leukocytosis Repeat plain x-ray on 4/5-negative for air in tissues. Blood cultures positive for group A strep-however repeat cultures on 4/4 negative so far Remains on IV penicillin G and Zyvox TTE negative for vegetation ID/vascular surgery following Venous Doppler/ABI pending-follow. Will await ID recommendations to see if patient requires a TEE. Continue to keep RLE elevated at all times.  Chronic RLE wound PAD-s/p intravascular lithotripsy of above-knee popliteal artery/angioplasty of right popliteal artery/SFA on 3/3 Appreciate wound care consult Vascular surgery following-pending arterial Doppler/ABI but dorsalis pedis pulse present.  Persistent atrial fibrillation Telemetry monitoring Rate controlled with oral beta-blocker On Librexa AF trial drug (comparing Eliquis versus milvexian -factor XI inhibitor)  HTN BP stable-continue metoprolol Hold HCTZ for now  HLD Statin  New diagnosis of DM-2 (A1c 6.4 on 4/4) SSI for now Metformin on discharge.  Recent Labs    06/18/23 1547 06/18/23 2102 06/19/23 0737  GLUCAP 162* 161* 137*     BMI Estimated body mass index is 31.51 kg/m as calculated from the following:   Height as of this encounter: 6' (1.829 m).   Weight as of this encounter: 105.4 kg.   Code status:   Code Status: Full Code   DVT Prophylaxis: STUDY - LIBREXIA-AF - apixaban 5 mg or  placebo capsule (PI-Sethi)    Family Communication: Spouse at bedside   Disposition Plan: Status is: Inpatient Remains inpatient appropriate because: Severity of illness   Planned Discharge Destination:CIR   Diet: Diet Order             Diet Heart Room service appropriate? Yes; Fluid consistency: Thin  Diet effective now                     Antimicrobial  agents: Anti-infectives (From admission, onward)    Start     Dose/Rate Route Frequency Ordered Stop   06/18/23 1000  penicillin G potassium 9 Million Units in dextrose 5 % 500 mL CONTINUOUS infusion        9 Million Units 41.7 mL/hr over 12 Hours Intravenous Every 12 hours 06/18/23 0848     06/18/23 1000  linezolid (ZYVOX) IVPB 600 mg        600 mg 300 mL/hr over 60 Minutes Intravenous Every 12 hours 06/18/23 0857     06/18/23 0600  cefTRIAXone (ROCEPHIN) 2 g in sodium chloride 0.9 % 100 mL IVPB  Status:  Discontinued        2 g 200 mL/hr over 30 Minutes Intravenous Every 24 hours 06/17/23 1435 06/18/23 0055   06/18/23 0600  penicillin G potassium 4 Million Units in dextrose 5 % 250 mL IVPB  Status:  Discontinued        4 Million Units 250 mL/hr over 60 Minutes Intravenous Every 6 hours 06/18/23 0055 06/18/23 0848   06/17/23 2200  metroNIDAZOLE (FLAGYL) IVPB 500 mg  Status:  Discontinued        500 mg 100 mL/hr over 60 Minutes Intravenous Every 12 hours 06/17/23 2030 06/18/23 0055   06/17/23 1500  metroNIDAZOLE (FLAGYL) IVPB 500 mg  Status:  Discontinued        500 mg 100 mL/hr over 60 Minutes Intravenous Every 12 hours 06/17/23 1401 06/17/23 1435   06/17/23 1500  ceFEPIme (MAXIPIME) 2 g in sodium chloride 0.9 % 100 mL IVPB  Status:  Discontinued        2 g 200 mL/hr over 30 Minutes Intravenous Every 12 hours 06/17/23 1404 06/17/23 1435   06/17/23 1500  vancomycin (VANCOREADY) IVPB 1750 mg/350 mL  Status:  Discontinued        1,750 mg 175 mL/hr over 120 Minutes Intravenous Every 24 hours 06/17/23 1406 06/18/23 0055   06/17/23 0415  cefTRIAXone (ROCEPHIN) 2 g in sodium chloride 0.9 % 100 mL IVPB        2 g 200 mL/hr over 30 Minutes Intravenous  Once 06/17/23 0404 06/17/23 0526        MEDICATIONS: Scheduled Meds:  albuterol  2.5 mg Nebulization Q6H   clopidogrel  75 mg Oral Daily   docusate sodium  100 mg Oral BID   ezetimibe  10 mg Oral Daily   finasteride  5 mg Oral Daily    insulin aspart  0-9 Units Subcutaneous TID WC   metoprolol succinate  25 mg Oral Q12H   mupirocin ointment   Topical Daily   pantoprazole  40 mg Oral Q1200   polyethylene glycol  17 g Oral Daily   rosuvastatin  20 mg Oral Daily   LIBREXIA-AF apixaban or placebo  5 mg Oral BID   LIBREXIA-AF [id number] (Milvexian) or placebo  1 tablet Oral BID   Continuous Infusions:  linezolid (ZYVOX) IV 600 mg (06/19/23 0848)   penicillin G potassium 9 Million Units in dextrose  5 % 500 mL CONTINUOUS infusion 9 Million Units (06/18/23 2206)   PRN Meds:.acetaminophen **OR** acetaminophen, morphine injection, ondansetron **OR** ondansetron (ZOFRAN) IV, mouth rinse, oxyCODONE   I have personally reviewed following labs and imaging studies  LABORATORY DATA: CBC: Recent Labs  Lab 06/17/23 0430 06/18/23 0449 06/19/23 0558  WBC 18.9* 18.8* 18.5*  NEUTROABS 16.2*  --  15.7*  HGB 12.3* 11.6* 11.4*  HCT 37.0* 35.9* 34.5*  MCV 85.8 87.6 86.3  PLT 194 160 191    Basic Metabolic Panel: Recent Labs  Lab 06/17/23 0430 06/18/23 0449 06/19/23 0558  NA 134* 135 132*  K 3.7 3.7 3.5  CL 100 99 98  CO2 22 23 23   GLUCOSE 134* 135* 129*  BUN 26* 20 15  CREATININE 1.42* 1.21 1.03  CALCIUM 8.6* 8.3* 8.1*    GFR: Estimated Creatinine Clearance: 64.6 mL/min (by C-G formula based on SCr of 1.03 mg/dL).  Liver Function Tests: Recent Labs  Lab 06/17/23 0430 06/18/23 0449  AST 29 34  ALT 17 19  ALKPHOS 56 56  BILITOT 1.3* 1.5*  PROT 6.1* 5.1*  ALBUMIN 3.4* 2.4*   No results for input(s): "LIPASE", "AMYLASE" in the last 168 hours. No results for input(s): "AMMONIA" in the last 168 hours.  Coagulation Profile: Recent Labs  Lab 06/17/23 0430 06/18/23 0449  INR 2.0* 1.6*    Cardiac Enzymes: No results for input(s): "CKTOTAL", "CKMB", "CKMBINDEX", "TROPONINI" in the last 168 hours.  BNP (last 3 results) No results for input(s): "PROBNP" in the last 8760 hours.  Lipid Profile: No  results for input(s): "CHOL", "HDL", "LDLCALC", "TRIG", "CHOLHDL", "LDLDIRECT" in the last 72 hours.  Thyroid Function Tests: No results for input(s): "TSH", "T4TOTAL", "FREET4", "T3FREE", "THYROIDAB" in the last 72 hours.  Anemia Panel: No results for input(s): "VITAMINB12", "FOLATE", "FERRITIN", "TIBC", "IRON", "RETICCTPCT" in the last 72 hours.  Urine analysis:    Component Value Date/Time   COLORURINE YELLOW 06/17/2023 1000   APPEARANCEUR CLEAR 06/17/2023 1000   LABSPEC 1.023 06/17/2023 1000   PHURINE 5.5 06/17/2023 1000   GLUCOSEU NEGATIVE 06/17/2023 1000   HGBUR TRACE (A) 06/17/2023 1000   BILIRUBINUR NEGATIVE 06/17/2023 1000   KETONESUR NEGATIVE 06/17/2023 1000   PROTEINUR 30 (A) 06/17/2023 1000   NITRITE NEGATIVE 06/17/2023 1000   LEUKOCYTESUR NEGATIVE 06/17/2023 1000    Sepsis Labs: Lactic Acid, Venous    Component Value Date/Time   LATICACIDVEN 2.4 (HH) 06/17/2023 0813    MICROBIOLOGY: Recent Results (from the past 240 hours)  Blood Culture (routine x 2)     Status: Abnormal (Preliminary result)   Collection Time: 06/17/23  4:03 AM   Specimen: BLOOD RIGHT ARM  Result Value Ref Range Status   Specimen Description   Final    BLOOD RIGHT ARM Performed at [facility], [address] [place], [place] [address]    Special Requests   Final    BOTTLES DRAWN AEROBIC AND ANAEROBIC Blood Culture adequate volume Performed at [facility], [address] [place], [place] [address]    Culture  Setup Time   Final    GRAM POSITIVE COCCI IN CHAINS ANAEROBIC BOTTLE ONLY CRITICAL RESULT CALLED TO, READ BACK BY AND VERIFIED WITH: PHARMD G ABBOTT 06/18/2023 @ 0042 BY AB    Culture (A)  Final    GROUP A STREP (S.PYOGENES) ISOLATED SUSCEPTIBILITIES TO FOLLOW HEALTH DEPARTMENT NOTIFIED Performed at [facility], [address], [place], [place] [address]    Report Status PENDING  Incomplete  Blood Culture ID Panel (Reflexed)      Status: Abnormal   Collection Time: 06/17/23  4:03 AM  Result Value Ref Range Status   Enterococcus faecalis NOT DETECTED NOT DETECTED Final   Enterococcus Faecium NOT DETECTED NOT DETECTED Final   Listeria monocytogenes NOT DETECTED NOT DETECTED Final   Staphylococcus species NOT DETECTED NOT DETECTED Final   Staphylococcus aureus (BCID) NOT DETECTED NOT DETECTED Final   Staphylococcus epidermidis NOT DETECTED NOT DETECTED Final   Staphylococcus lugdunensis NOT DETECTED NOT DETECTED Final   Streptococcus species DETECTED (A) NOT DETECTED Final    Comment: CRITICAL RESULT CALLED TO, READ BACK BY AND VERIFIED WITH: PHARMD G ABBOTT 06/18/2023 @ 0042 BY AB    Streptococcus agalactiae NOT DETECTED NOT DETECTED Final   Streptococcus pneumoniae NOT DETECTED NOT DETECTED Final   Streptococcus pyogenes DETECTED (A) NOT DETECTED Final    Comment: CRITICAL RESULT CALLED TO, READ BACK BY AND VERIFIED WITH: PHARMD G ABBOTT 06/18/2023 @ 0042 BY AB    A.calcoaceticus-baumannii NOT DETECTED NOT DETECTED Final   Bacteroides fragilis NOT DETECTED NOT DETECTED Final   Enterobacterales NOT DETECTED NOT DETECTED Final   Enterobacter cloacae complex NOT DETECTED NOT DETECTED Final   Escherichia coli NOT DETECTED NOT DETECTED Final   Klebsiella aerogenes NOT DETECTED NOT DETECTED Final   Klebsiella oxytoca NOT DETECTED NOT DETECTED Final   Klebsiella pneumoniae NOT DETECTED NOT DETECTED Final   Proteus species NOT DETECTED NOT DETECTED Final   Salmonella species NOT DETECTED NOT DETECTED Final   Serratia marcescens NOT DETECTED NOT DETECTED Final   Haemophilus influenzae NOT DETECTED NOT DETECTED Final   Neisseria meningitidis NOT DETECTED NOT DETECTED Final   Pseudomonas aeruginosa NOT DETECTED NOT DETECTED Final   Stenotrophomonas maltophilia NOT DETECTED NOT DETECTED Final   Candida albicans NOT DETECTED NOT DETECTED Final   Candida auris NOT DETECTED NOT DETECTED Final   Candida glabrata NOT  DETECTED NOT DETECTED Final   Candida krusei NOT DETECTED NOT DETECTED Final   Candida parapsilosis NOT DETECTED NOT DETECTED Final   Candida tropicalis NOT DETECTED NOT DETECTED Final   Cryptococcus neoformans/gattii NOT DETECTED NOT DETECTED Final    Comment: Performed at [facility], [address] [place], [place] [address]  Blood Culture (routine x 2)     Status: Abnormal (Preliminary result)   Collection Time: 06/17/23  4:08 AM   Specimen: BLOOD RIGHT HAND  Result Value Ref Range Status   Specimen Description   Final    BLOOD RIGHT HAND Performed at [facility], [address], [place], [place] [address]    Special Requests   Final    BOTTLES DRAWN AEROBIC AND ANAEROBIC Blood Culture adequate volume Performed at [facility], [address], [place], [place] [address]    Culture  Setup Time   Final    GRAM POSITIVE COCCI IN PAIRS IN CHAINS AEROBIC BOTTLE ONLY CRITICAL VALUE NOTED.  VALUE IS CONSISTENT WITH PREVIOUSLY REPORTED AND CALLED VALUE. Performed at [facility], [address], [place], [place] [address]    Culture GROUP A STREP (S.PYOGENES) ISOLATED (A)  Final   Report Status PENDING  Incomplete  Culture, blood (Routine X 2) w Reflex to ID Panel     Status: None (Preliminary result)   Collection Time: 06/18/23  6:10 PM   Specimen: BLOOD  Result Value Ref Range Status   Specimen Description BLOOD SITE NOT SPECIFIED  Final   Special Requests  Final    BOTTLES DRAWN AEROBIC AND ANAEROBIC Blood Culture adequate volume   Culture   Final    NO GROWTH < 12 HOURS Performed at [facility], [address], [place], [place] [address]    Report Status PENDING  Incomplete  Culture, blood (Routine X 2) w Reflex to ID Panel     Status: None (Preliminary result)   Collection Time: 06/18/23  6:12 PM   Specimen: BLOOD  Result Value Ref Range Status   Specimen Description BLOOD SITE NOT SPECIFIED  Final   Special  Requests   Final    BOTTLES DRAWN AEROBIC AND ANAEROBIC Blood Culture results may not be optimal due to an inadequate volume of blood received in culture bottles   Culture   Final    NO GROWTH < 12 HOURS Performed at [facility], [address], [place], [place] [address]    Report Status PENDING  Incomplete    RADIOLOGY STUDIES/RESULTS: DG Tibia/Fibula Right Port Result Date: 06/19/2023 CLINICAL DATA:  Leg pain. EXAM: PORTABLE RIGHT TIBIA AND FIBULA - 2 VIEW COMPARISON:  CT 06/17/2023 FINDINGS: No fracture. No erosive or bony destructive change. Knee osteoarthritis and chondrocalcinosis again seen. Again seen calcifications in the region of the Achilles tendon. There is generalized subcutaneous edema. Arterial vascular calcifications. No soft tissue gas. IMPRESSION: 1. Generalized subcutaneous edema. No acute osseous abnormality. 2. Knee osteoarthritis and chondrocalcinosis. Electronically Signed   By: Narda Rutherford M.D.   On: 06/19/2023 10:05   ECHOCARDIOGRAM COMPLETE Result Date: 06/18/2023    ECHOCARDIOGRAM REPORT   Patient Name:   JACQUELYN ANTONY Date of Exam: 06/18/2023 Medical Rec #:  [id number]          Height:       72.0 in Accession #:    [id number]         Weight:       216.9 lb Date of Birth:  1934-12-19          BSA:          2.205 m Patient Age:    88 years           BP:           111/74 mmHg Patient Gender: M                  HR:           68 bpm. Exam Location:  Inpatient Procedure: 2D Echo, Cardiac Doppler and Color Doppler (Both Spectral and Color            Flow Doppler were utilized during procedure). Indications:    Bacteremia  History:        Patient has prior history of Echocardiogram examinations, most                 recent 09/08/2021. Arrythmias:Atrial Fibrillation,                 Signs/Symptoms:Edema, Fatigue, Dizziness/Lightheadedness and                 Syncope; Risk Factors:Hypertension and Dyslipidemia.  Sonographer:    Vern Claude Referring Phys: [id number] Dewayne Shorter M  Alwyn Cordner IMPRESSIONS  1. Left ventricular ejection fraction, by estimation, is 50%. The left ventricle has mildly decreased function. The left ventricle demonstrates regional wall motion abnormalities with suspected basal to mid inferolateral and basal anterolateral hypokinesis. However, endocardial definition was poor. Definity contrast may have helped but was not used.  The left ventricular internal cavity size was mildly dilated. Left ventricular diastolic parameters are indeterminate.  2. Right ventricular systolic function is mildly reduced. The right ventricular size is normal.  3. Left atrial size was mildly dilated.  4. The mitral valve is normal in structure. Mild mitral valve regurgitation. No evidence of mitral stenosis.  5. The aortic valve is tricuspid. There is moderate calcification of the aortic valve. Aortic valve regurgitation is trivial. Aortic valve sclerosis/calcification is present, without any evidence of aortic stenosis.  6. Aortic dilatation noted. There is mild dilatation of the aortic root, measuring 39 mm.  7. The IVC was not visualized.  8. No definite valvular vegetation was visualized, but technically difficult study, and if endocarditis is suspected will need TEE. FINDINGS  Left Ventricle: Left ventricular ejection fraction, by estimation, is 50%. The left ventricle has mildly decreased function. The left ventricle demonstrates regional wall motion abnormalities. The left ventricular internal cavity size was mildly dilated. There is no left ventricular hypertrophy. Left ventricular diastolic parameters are indeterminate. Right Ventricle: The right ventricular size is normal. No increase in right ventricular wall thickness. Right ventricular systolic function is mildly reduced. Left Atrium: Left atrial size was mildly dilated. Right Atrium: Right atrial size was normal in size. Pericardium: There is no evidence of pericardial effusion. Mitral Valve: The mitral valve is normal in  structure. There is mild calcification of the mitral valve leaflet(s). Mild mitral annular calcification. Mild mitral valve regurgitation. No evidence of mitral valve stenosis. MV peak gradient, 6.0 mmHg. The mean mitral valve gradient is 1.0 mmHg. Tricuspid Valve: The tricuspid valve is normal in structure. Tricuspid valve regurgitation is mild. Aortic Valve: The aortic valve is tricuspid. There is moderate calcification of the aortic valve. Aortic valve regurgitation is trivial. Aortic valve sclerosis/calcification is present, without any evidence of aortic stenosis. Aortic valve mean gradient measures 4.3 mmHg. Aortic valve peak gradient measures 7.8 mmHg. Aortic valve area, by VTI measures 1.59 cm. Pulmonic Valve: The pulmonic valve was normal in structure. Pulmonic valve regurgitation is trivial. Aorta: Aortic dilatation noted. There is mild dilatation of the aortic root, measuring 39 mm. Venous: The IVC was not visualized. IAS/Shunts: No atrial level shunt detected by color flow Doppler.  LEFT VENTRICLE PLAX 2D LVIDd:         6.30 cm      Diastology LVIDs:         4.00 cm      LV e' medial:    9.79 cm/s LV PW:         0.80 cm      LV E/e' medial:  10.4 LV IVS:        0.90 cm      LV e' lateral:   13.70 cm/s LVOT diam:     2.00 cm      LV E/e' lateral: 7.4 LV SV:         32 LV SV Index:   14 LVOT Area:     3.14 cm  LV Volumes (MOD) LV vol d, MOD A2C: 112.0 ml LV vol d, MOD A4C: 122.0 ml LV vol s, MOD A2C: 44.8 ml LV vol s, MOD A4C: 47.6 ml LV SV MOD A2C:     67.2 ml LV SV MOD A4C:     122.0 ml LV SV MOD BP:      68.8 ml RIGHT VENTRICLE RV Basal diam:  3.40 cm RV Mid diam:    2.30 cm RV S prime:  9.17 cm/s LEFT ATRIUM             Index        RIGHT ATRIUM           Index LA diam:        5.30 cm 2.40 cm/m   RA Area:     16.60 cm LA Vol (A2C):   48.1 ml 21.81 ml/m  RA Volume:   35.00 ml  15.87 ml/m LA Vol (A4C):   53.6 ml 24.30 ml/m LA Biplane Vol: 54.3 ml 24.62 ml/m  AORTIC VALVE                     PULMONIC VALVE AV Area (Vmax):    1.46 cm     PV Vmax:       0.88 m/s AV Area (Vmean):   1.29 cm     PV Peak grad:  3.1 mmHg AV Area (VTI):     1.59 cm AV Vmax:           139.33 cm/s AV Vmean:          93.500 cm/s AV VTI:            0.200 m AV Peak Grad:      7.8 mmHg AV Mean Grad:      4.3 mmHg LVOT Vmax:         64.90 cm/s LVOT Vmean:        38.500 cm/s LVOT VTI:          0.101 m LVOT/AV VTI ratio: 0.51  AORTA Ao Root diam: 3.90 cm Ao Asc diam:  3.30 cm MITRAL VALVE                TRICUSPID VALVE MV Area (PHT): 3.66 cm     TR Peak grad:   24.4 mmHg MV Area VTI:   1.50 cm     TR Vmax:        247.00 cm/s MV Peak grad:  6.0 mmHg MV Mean grad:  1.0 mmHg     SHUNTS MV Vmax:       1.22 m/s     Systemic VTI:  0.10 m MV Vmean:      50.1 cm/s    Systemic Diam: 2.00 cm MV Decel Time: 207 msec MR Peak grad: 105.5 mmHg MR Mean grad: 69.0 mmHg MR Vmax:      513.50 cm/s MR Vmean:     388.5 cm/s MV E velocity: 102.00 cm/s [place] McleanMD Electronically signed by Wilfred Lacy Signature Date/Time: 06/18/2023/3:10:23 PM    Final    CT TIBIA FIBULA RIGHT WO CONTRAST Result Date: 06/17/2023 CLINICAL DATA:  right leg wound and cellulitis. EXAM: CT OF THE LOWER RIGHT EXTREMITY WITHOUT CONTRAST TECHNIQUE: Multidetector CT imaging of the right lower extremity was performed according to the standard protocol. RADIATION DOSE REDUCTION: This exam was performed according to the departmental dose-optimization program which includes automated exposure control, adjustment of the mA and/or kV according to patient size and/or use of iterative reconstruction technique. COMPARISON:  None Available. FINDINGS: Bones/Joint/Cartilage Normal alignment. No acute fracture or dislocation. 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 No osseous erosions. Ligaments Suboptimally assessed by CT. Muscles and Tendons There is marked fatty atrophy of the  visualized skeletal musculature,. There is thickening and intrasubstance calcification of the mid to distal Achilles tendon, likely the sequela of remote inflammation or trauma. No intramuscular  fluid collections or intrafascial edema. Soft tissues There is extensive subcutaneous edema of the right lower extremity asymmetrically more severe along the anterolateral aspect. No loculated subcutaneous fluid collections. The edematous changes appear superficial to the investing muscular fascia. Small right knee effusion is partially visualized. IMPRESSION: 1. Extensive subcutaneous edema of the right lower extremity asymmetrically more severe along the anterolateral aspect. No loculated subcutaneous fluid collections. The edematous changes appear superficial to the investing muscular fascia. 2. No acute fracture or dislocation. No osseous erosions. 3. 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 4. 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 5. Marked fatty atrophy of the visualized skeletal musculature. 6. Thickening and intrasubstance calcification of the mid to distal Achilles tendon, likely the sequela of remote inflammation or trauma. Electronically Signed   By: Helyn Numbers M.D.   On: 06/17/2023 21:30     LOS: 2 days   Jeoffrey Massed, MD  [facility]    To contact the attending provider between 7A-7P or the covering provider during after hours 7P-7A, please log into the web site [email or web address] and access using universal [place] password for that web site. If you do not have the password, please call the hospital operator.  06/19/2023, 10:40 AM

## 2023-06-19 NOTE — Progress Notes (Signed)
 VASCULAR LAB    Right lower extremity venous Doppler has been performed.  See CV proc for preliminary results.   Terald Jump, RVT 06/19/2023, 11:54 AM

## 2023-06-19 NOTE — Progress Notes (Signed)
 Inpatient Rehab Admissions:  Inpatient Rehab Consult received.  I met with pt, pt's wife Harriett Sine, pt's daughter and pt's granddaughter at the bedside for rehabilitation assessment and to discuss goals and expectations of an inpatient rehab admission.  Discussed average length of stay and discharge home after completion of CIR. They acknowledged  understanding. Pt is not interested in CIR at this time. TOC made aware.  Signed: Wolfgang Phoenix, MS, CCC-SLP Admissions Coordinator [phone]

## 2023-06-19 NOTE — Evaluation (Signed)
 Physical Therapy Evaluation Patient Details Name: Logan French MRN: [id number] DOB: 10-11-1934 Today's Date: 06/19/2023  History of Present Illness  Pt is an 89 y/o male admitted 4/3 for RLE cellulitis and nonhealing wound. +for bacetermia. PMH: prostate cancer, anemia, HTN, HLD, a fib, b12 deficiency, PAD s/p vascular intervention.  Clinical Impression  Pt admitted with above diagnosis. Mod I and driving PTA, retired [employer] consultant.  Wife present and very supportive during session. Concerned with darkening skin color of distal RLE, RN notified. He is oriented x4 however having significant trouble following simple commands with mobility, requires multimodal cues to help initiate and sequence with guided tasks. Required max assist for transfer from recliner to stand, and with step pivot to bed. Increased RLE pain. Pt currently with functional limitations due to the deficits listed below (see PT Problem List). Pt will benefit from acute skilled PT to increase their independence and safety with mobility to allow discharge.           If plan is discharge home, recommend the following: Two people to help with walking and/or transfers;Two people to help with bathing/dressing/bathroom;Assistance with cooking/housework;Assist for transportation;Help with stairs or ramp for entrance   Can travel by private vehicle        Equipment Recommendations Rolling walker (2 wheels);Wheelchair (measurements PT);Wheelchair cushion (measurements PT)  Recommendations for Other Services  Rehab consult    Functional Status Assessment Patient has had a recent decline in their functional status and demonstrates the ability to make significant improvements in function in a reasonable and predictable amount of time.     Precautions / Restrictions Precautions Precautions: Fall Restrictions Weight Bearing Restrictions Per Provider Order: No      Mobility  Bed Mobility Overal bed mobility: Needs Assistance Bed  Mobility: Sidelying to Sit, Rolling Rolling: Min assist Sidelying to sit: Min assist, Used rails       General bed mobility comments: Min assist to support LEs lightly into bed, he required max cues for technique however. Very slow with movements and not consistent with direction following. Rolls with min assist to facilitate and guide.    Transfers Overall transfer level: Needs assistance Equipment used: Rolling walker (2 wheels) Transfers: Sit to/from Stand, Bed to chair/wheelchair/BSC Sit to Stand: Max assist   Step pivot transfers: Max assist       General transfer comment: Max assist for boost to stand. Took a considerable amount of time to help sequence to edge of chair and align LEs in efficent place prior to rising. Max assist to sequence steps to bed with RW for support. Often doing the opposite of what was instructed, required multimodal cues to facilitate weight shift and help step towards bed. No buckling however.    Ambulation/Gait                  Stairs            Wheelchair Mobility     Tilt Bed    Modified Rankin (Stroke Patients Only)       Balance Overall balance assessment: Needs assistance Sitting-balance support: No upper extremity supported, Feet supported Sitting balance-Leahy Scale: Fair     Standing balance support: Bilateral upper extremity supported, During functional activity Standing balance-Leahy Scale: Poor Standing balance comment: BUE support when standing                             Pertinent Vitals/Pain Pain Assessment Pain Assessment: Faces  Faces Pain Scale: Hurts whole lot Pain Location: R calf, generalized Pain Descriptors / Indicators: Grimacing, Guarding, Aching, Sore Pain Intervention(s): Monitored during session, Repositioned, Limited activity within patient's tolerance    Home Living Family/patient expects to be discharged to:: Private residence Living Arrangements: Spouse/significant  other Available Help at Discharge: Family Type of Home: House Home Access: Stairs to enter   [employer] of Steps: "just a couple" Alternate Level Stairs-Number of Steps: 2 steps down into kitchen/living room. main level has bedroom/bathroom and an upstairs that pt does not have to use Home Layout: Multi-level;Able to live on main level with bedroom/bathroom Home Equipment: Gilmer Mor - single point      Prior Function Prior Level of Function : Independent/Modified Independent;Driving             Mobility Comments: no AD for mobility ADLs Comments: Indep with ADLs, IADLs     Extremity/Trunk Assessment   Upper Extremity Assessment Upper Extremity Assessment: Defer to OT evaluation    Lower Extremity Assessment Lower Extremity Assessment: RLE deficits/detail RLE Deficits / Details: Edematous, erythematous    Cervical / Trunk Assessment Cervical / Trunk Assessment: Normal  Communication   Communication Communication: No apparent difficulties    Cognition Arousal: Lethargic Behavior During Therapy: WFL for tasks assessed/performed   PT - Cognitive impairments: Attention, Initiation, Sequencing, Problem solving (Oriented x4)                       PT - Cognition Comments: Able to answer questions and recall well. Has trouble sequencing and initiating Following commands: Impaired Following commands impaired: Follows one step commands with increased time, Follows one step commands inconsistently     Cueing Cueing Techniques: Verbal cues, Gestural cues, Tactile cues     General Comments General comments (skin integrity, edema, etc.): Increased pain RLE with movement. Educated on RLE elevation, wife present and reinforced. Pt felt he had a BM after returning to room, RN notified. Wife concerned that RLE darker than before, RN notified.    Exercises     Assessment/Plan    PT Assessment Patient needs continued PT services  PT Problem List Decreased  strength;Decreased range of motion;Decreased activity tolerance;Decreased balance;Decreased mobility;Decreased cognition;Decreased coordination;Decreased knowledge of use of DME;Decreased safety awareness;Decreased knowledge of precautions;Obesity;Decreased skin integrity;Pain       PT Treatment Interventions DME instruction;Gait training;Functional mobility training;Therapeutic exercise;Therapeutic activities;Neuromuscular re-education;Balance training;Cognitive remediation;Patient/family education;Wheelchair mobility training    PT Goals (Current goals can be found in the Care Plan section)  Acute Rehab PT Goals Patient Stated Goal: Get well, reduce pain PT Goal Formulation: With patient Time For Goal Achievement: 07/03/23 Potential to Achieve Goals: Good    Frequency Min 2X/week     Co-evaluation               AM-PAC PT "6 Clicks" Mobility  Outcome Measure Help needed turning from your back to your side while in a flat bed without using bedrails?: A Little Help needed moving from lying on your back to sitting on the side of a flat bed without using bedrails?: A Little Help needed moving to and from a bed to a chair (including a wheelchair)?: A Lot Help needed standing up from a chair using your arms (e.g., wheelchair or bedside chair)?: A Lot Help needed to walk in hospital room?: Total Help needed climbing 3-5 steps with a railing? : Total 6 Click Score: 12    End of Session Equipment Utilized During Treatment: Gait belt Activity  Tolerance: Patient limited by pain Patient left: in bed;with call bell/phone within reach;with bed alarm set;with family/visitor present;with nursing/sitter in room Nurse Communication: Mobility status (Pt requests peri-care after returning to bed, may have had BM.) PT Visit Diagnosis: Unsteadiness on feet (R26.81);Muscle weakness (generalized) (M62.81);Difficulty in walking, not elsewhere classified (R26.2);Pain Pain - Right/Left: Right Pain -  part of body: Leg    Time: 2841-3244 PT Time Calculation (min) (ACUTE ONLY): 22 min   Charges:   PT Evaluation $PT Eval Moderate Complexity: 1 Mod   PT General Charges $$ ACUTE PT VISIT: 1 Visit         Kathlyn Sacramento, PT, DPT [facility]  Rehabilitation Services Physical Therapist Office: [phone] Website: [email or web address]   Berton Mount 06/19/2023, 3:58 PM

## 2023-06-20 ENCOUNTER — Inpatient Hospital Stay (HOSPITAL_COMMUNITY)

## 2023-06-20 DIAGNOSIS — L97311 Non-pressure chronic ulcer of right ankle limited to breakdown of skin: Secondary | ICD-10-CM | POA: Diagnosis not present

## 2023-06-20 DIAGNOSIS — L03115 Cellulitis of right lower limb: Secondary | ICD-10-CM | POA: Diagnosis not present

## 2023-06-20 DIAGNOSIS — I739 Peripheral vascular disease, unspecified: Secondary | ICD-10-CM

## 2023-06-20 DIAGNOSIS — M25562 Pain in left knee: Secondary | ICD-10-CM

## 2023-06-20 LAB — CBC WITH DIFFERENTIAL/PLATELET
Abs Immature Granulocytes: 0.17 10*3/uL — ABNORMAL HIGH (ref 0.00–0.07)
Basophils Absolute: 0.1 10*3/uL (ref 0.0–0.1)
Basophils Relative: 0 %
Eosinophils Absolute: 0 10*3/uL (ref 0.0–0.5)
Eosinophils Relative: 0 %
HCT: 36 % — ABNORMAL LOW (ref 39.0–52.0)
Hemoglobin: 11.7 g/dL — ABNORMAL LOW (ref 13.0–17.0)
Immature Granulocytes: 1 %
Lymphocytes Relative: 7 %
Lymphs Abs: 1.3 10*3/uL (ref 0.7–4.0)
MCH: 28.1 pg (ref 26.0–34.0)
MCHC: 32.5 g/dL (ref 30.0–36.0)
MCV: 86.5 fL (ref 80.0–100.0)
Monocytes Absolute: 1.6 10*3/uL — ABNORMAL HIGH (ref 0.1–1.0)
Monocytes Relative: 9 %
Neutro Abs: 16 10*3/uL — ABNORMAL HIGH (ref 1.7–7.7)
Neutrophils Relative %: 83 %
Platelets: 227 10*3/uL (ref 150–400)
RBC: 4.16 MIL/uL — ABNORMAL LOW (ref 4.22–5.81)
RDW: 15.4 % (ref 11.5–15.5)
WBC: 19.1 10*3/uL — ABNORMAL HIGH (ref 4.0–10.5)
nRBC: 0 % (ref 0.0–0.2)

## 2023-06-20 LAB — PROCALCITONIN: Procalcitonin: 2.77 ng/mL

## 2023-06-20 LAB — CULTURE, BLOOD (ROUTINE X 2)
Special Requests: ADEQUATE
Special Requests: ADEQUATE

## 2023-06-20 LAB — GLUCOSE, CAPILLARY
Glucose-Capillary: 145 mg/dL — ABNORMAL HIGH (ref 70–99)
Glucose-Capillary: 126 mg/dL — ABNORMAL HIGH (ref 70–99)
Glucose-Capillary: 103 mg/dL — ABNORMAL HIGH (ref 70–99)

## 2023-06-20 LAB — BASIC METABOLIC PANEL WITH GFR
Anion gap: 11 (ref 5–15)
BUN: 19 mg/dL (ref 8–23)
CO2: 24 mmol/L (ref 22–32)
Calcium: 8.2 mg/dL — ABNORMAL LOW (ref 8.9–10.3)
Chloride: 95 mmol/L — ABNORMAL LOW (ref 98–111)
Creatinine, Ser: 1.19 mg/dL (ref 0.61–1.24)
GFR, Estimated: 59 mL/min — ABNORMAL LOW (ref >60)
Glucose, Bld: 137 mg/dL — ABNORMAL HIGH (ref 70–99)
Potassium: 4 mmol/L (ref 3.5–5.1)
Sodium: 130 mmol/L — ABNORMAL LOW (ref 135–145)

## 2023-06-20 LAB — MAGNESIUM: Magnesium: 1.7 mg/dL (ref 1.7–2.4)

## 2023-06-20 LAB — BRAIN NATRIURETIC PEPTIDE: B Natriuretic Peptide: 475 pg/mL — ABNORMAL HIGH (ref 0.0–100.0)

## 2023-06-20 LAB — C-REACTIVE PROTEIN: CRP: 25.8 mg/dL — ABNORMAL HIGH (ref <1.0)

## 2023-06-20 MED ORDER — JUVEN PO PACK
1.0000 | PACK | Freq: Two times a day (BID) | ORAL | Status: DC
  Administered 2023-06-20 – 2023-06-25 (×10): 1 via ORAL
  Filled 2023-06-20 (×10): qty 1

## 2023-06-20 MED ORDER — GUAIFENESIN-DM 100-10 MG/5ML PO SYRP
5.0000 mL | ORAL_SOLUTION | ORAL | Status: DC | PRN
  Administered 2023-06-20 – 2023-06-21 (×3): 5 mL via ORAL
  Filled 2023-06-20 (×3): qty 5

## 2023-06-20 MED ORDER — MAGNESIUM SULFATE 2 GM/50ML IV SOLN
2.0000 g | Freq: Once | INTRAVENOUS | Status: AC
  Administered 2023-06-20: 2 g via INTRAVENOUS
  Filled 2023-06-20: qty 50

## 2023-06-20 NOTE — Consult Note (Signed)
 ORTHOPAEDIC CONSULTATION  REQUESTING PHYSICIAN: Leroy Sea, MD  Chief Complaint: Swelling and cellulitis right leg.  HPI: Logan French is a 89 y.o. male who presents with peripheral vascular disease of the right lower extremity with venous and lymphatic insufficiency.  Patient has been followed at the wound center.  He has an ulcer over the Achilles for the past 5 months.  Patient is status post endovascular revascularization with vascular vein surgery.  Past Medical History:  Diagnosis Date   A-fib (HCC)    B12 deficiency    Diverticulosis    Fatigue    History of colonic polyps    Hyperlipemia    Hypertension    Pernicious anemia    Prostate cancer (HCC)    Syncope    Syncope    Past Surgical History:  Procedure Laterality Date   ABDOMINAL AORTOGRAM W/LOWER EXTREMITY N/A 05/17/2023   Procedure: ABDOMINAL AORTOGRAM W/LOWER EXTREMITY;  Surgeon: [name], MD;  Location: [facility] INVASIVE CV LAB;  Service: Cardiovascular;  Laterality: N/A;   CARDIOVERSION N/A 10/07/2021   Procedure: CARDIOVERSION;  Surgeon: Thomasene Ripple, DO;  Location: [facility] ENDOSCOPY;  Service: Cardiovascular;  Laterality: N/A;   IR ANGIO INTRA EXTRACRAN SEL COM CAROTID INNOMINATE BILAT MOD SED  09/10/2017   IR ANGIO VERTEBRAL SEL SUBCLAVIAN INNOMINATE BILAT MOD SED  09/10/2017   PERIPHERAL INTRAVASCULAR LITHOTRIPSY Right 05/17/2023   Procedure: PERIPHERAL INTRAVASCULAR LITHOTRIPSY;  Surgeon: [name], MD;  Location: [facility] INVASIVE CV LAB;  Service: Cardiovascular;  Laterality: Right;  SFA-POP   PERIPHERAL VASCULAR BALLOON ANGIOPLASTY Right 05/17/2023   Procedure: PERIPHERAL VASCULAR BALLOON ANGIOPLASTY;  Surgeon: [name], MD;  Location: [facility] INVASIVE CV LAB;  Service: Cardiovascular;  Laterality: Right;  SFA-POP   Social History   Socioeconomic History   Marital status: Married    Spouse name: Not on file   Number of children: Not on file   Years of education: Not on file   Highest  education level: Not on file  Occupational History   Not on file  Tobacco Use   Smoking status: Never   Smokeless tobacco: Never   Tobacco comments:    Never smoke 09/23/21  Vaping Use   Vaping status: Never Used  Substance and Sexual Activity   Alcohol use: Yes    Alcohol/week: 2.0 standard drinks of alcohol    Types: 1 Cans of beer, 1 Shots of liquor per week    Comment: occasionally   Drug use: No   Sexual activity: Not on file  Other Topics Concern   Not on file  Social History Narrative   Lives with wife   Right Handed   Drinks 1-2 cups daily   Social Drivers of Health   Financial Resource Strain: Not on file  Food Insecurity: No Food Insecurity (06/17/2023)   Hunger Vital Sign    Worried About Running Out of Food in the Last Year: Never true    Ran Out of Food in the Last Year: Never true  Transportation Needs: No Transportation Needs (06/17/2023)   PRAPARE - Administrator, Civil Service (Medical): No    Lack of Transportation (Non-Medical): No  Physical Activity: Not on file  Stress: Not on file  Social Connections: Socially Integrated (06/17/2023)   Social Connection and Isolation Panel [NHANES]    Frequency of Communication with Friends and Family: Twice a week    Frequency of Social Gatherings with Friends and Family: Twice a week  Attends Religious Services: 1 to 4 times per year    Active Member of Clubs or Organizations: Yes    Attends Banker Meetings: Never    Marital Status: Married   History reviewed. No pertinent family history. - negative except otherwise stated in the family history section No Known Allergies Prior to Admission medications   Medication Sig Start Date End Date Taking? Authorizing Provider  acetaminophen (TYLENOL) 500 MG tablet Take 1,500 mg by mouth daily as needed for moderate pain (pain score 4-6), mild pain (pain score 1-3) or headache.   Yes [provider]  amLODipine (NORVASC) 5 MG tablet Take  5 mg by mouth daily.  06/14/17  Yes [provider]  clopidogrel (PLAVIX) 75 MG tablet Take 75 mg by mouth daily.   Yes [provider]  ezetimibe (ZETIA) 10 MG tablet Take 10 mg by mouth daily. 10/12/17  Yes [provider]  finasteride (PROSCAR) 5 MG tablet Take 5 mg by mouth daily.   Yes [provider]  hydrochlorothiazide (MICROZIDE) 12.5 MG capsule TAKE ONE CAPSULE EVERY DAY 12/16/22  Yes Lennette Bihari, MD  metoprolol succinate (TOPROL-XL) 25 MG 24 hr tablet TAKE ONE TABLET BY MOUTH EVERY DAY 11/17/22  Yes Lennette Bihari, MD  rosuvastatin (CRESTOR) 20 MG tablet TAKE ONE TABLET DAILY 11/05/22  Yes Lennette Bihari, MD  STUDY - LIBREXIA-AF - apixaban 5 mg or placebo capsule (PI-Sethi) Take 1 capsule (5 mg total) by mouth 2 (two) times daily. Take at approximately the same time of day with or without food. Please bring bottle back with you to every visit; do not discard bottle. 03/25/23  Yes Micki Riley, MD  STUDY Lois Huxley - [id number] (Milvexian) 100 mg or placebo tablet (PI-Sethi) Take 1 tablet by mouth 2 (two) times daily. Take at approximately the same time of day with or without food. Please bring bottle back with you to every visit; do not discard bottle. 03/25/23  Yes Micki Riley, MD  vitamin B-12 (CYANOCOBALAMIN) 500 MCG tablet Take 500 mcg by mouth daily.    Yes [provider]   DG Tibia/Fibula Right Result Date: 06/20/2023 CLINICAL DATA:  Lower extremity cellulitis. EXAM: RIGHT TIBIA AND FIBULA - 2 VIEW COMPARISON:  None Available. FINDINGS: No evidence for an acute fracture. No bony destruction in the tibia or fibula to suggest osteomyelitis. Mass degenerative changes are seen at the knee. No soft tissue gas evident in the leg. IMPRESSION: 1. No findings to suggest osteomyelitis in the tibia or fibula 2. Degenerative changes at the knee. Electronically Signed   By: Kennith Center M.D.   On: 06/20/2023 08:03   DG Knee Complete 4 Views  Left Result Date: 06/20/2023 CLINICAL DATA:  Recurrent left knee pain. EXAM: LEFT KNEE - COMPLETE 4+ VIEW COMPARISON:  None Available. FINDINGS: No evidence of fracture, dislocation, or joint effusion. Minimal spurring is seen involving medial and lateral joint spaces. Chondrocalcinosis is also noted most consistent with early degenerative joint disease. Vascular calcifications are noted. IMPRESSION: Probable mild degenerative joint disease. No acute abnormality seen. Electronically Signed   By: Lupita Raider M.D.   On: 06/20/2023 08:03   VAS [place] LOWER EXTREMITY ARTERIAL DUPLEX Result Date: 06/19/2023 LOWER EXTREMITY ARTERIAL DUPLEX STUDY Patient Name:  Logan French  Date of Exam:   06/19/2023 Medical Rec #: [id number]           Accession #:    [id number] Date of Birth: Feb 06, 1935  Patient Gender: M Patient Age:   57 years Exam Location:  [facility] Procedure:      VAS [place] LOWER EXTREMITY ARTERIAL DUPLEX Referring Phys: Emilie Rutter --------------------------------------------------------------------------------  Indications: Ulceration. Cellulitis. High Risk Factors: Hypertension, hyperlipidemia. Other Factors: Atrial fibrillation.  Vascular Interventions: Abdominal aortogram with LE peripheral                         intravascular lithotripsy and balloon angioplasty of the                         SFA-Pop                          05/17/23. Current ABI:            R:patent DP and AT TBI:0.62 L:0.98 TBI 0.35 Limitations: Edema and pain with touch secondary to infection Comparison Study: Prior right LE arterial duplex done 04/26/23 Performing Technologist: Sherren Kerns RVS  Examination Guidelines: A complete evaluation includes B-mode imaging, spectral Doppler, color Doppler, and power Doppler as needed of all accessible portions of each vessel. Bilateral testing is considered an integral part of a complete examination. Limited examinations for reoccurring indications may be performed as noted.   +----------+--------+-----+---------------+-----------+--------+ RIGHT     PSV cm/sRatioStenosis       Waveform   Comments +----------+--------+-----+---------------+-----------+--------+ CFA Distal65                          multiphasic         +----------+--------+-----+---------------+-----------+--------+ DFA       58                          multiphasic         +----------+--------+-----+---------------+-----------+--------+ SFA Prox  124                         biphasic            +----------+--------+-----+---------------+-----------+--------+ SFA Mid   264          30-49% stenosisbiphasic            +----------+--------+-----+---------------+-----------+--------+ SFA Distal235          30-49% stenosisbiphasic            +----------+--------+-----+---------------+-----------+--------+ POP Prox  80                          monophasic          +----------+--------+-----+---------------+-----------+--------+ POP Mid   66                          monophasic          +----------+--------+-----+---------------+-----------+--------+ POP Distal60                          monophasic          +----------+--------+-----+---------------+-----------+--------+ TP Trunk  116                         monophasic          +----------+--------+-----+---------------+-----------+--------+ ATA Prox  129  monophasic          +----------+--------+-----+---------------+-----------+--------+ ATA Mid   133                         monophasic          +----------+--------+-----+---------------+-----------+--------+ ATA Distal105                         monophasic          +----------+--------+-----+---------------+-----------+--------+ PTA Prox               occluded                           +----------+--------+-----+---------------+-----------+--------+ PTA Mid                occluded                            +----------+--------+-----+---------------+-----------+--------+ PTA Distal             occluded                           +----------+--------+-----+---------------+-----------+--------+  Summary: Right: 30-49% stenosis noted in the mid and distal superficial femoral artery. PTA remains occluded.  See table(s) above for measurements and observations.    Preliminary    VAS [place] ABI WITH/WO TBI Result Date: 06/19/2023  LOWER EXTREMITY DOPPLER STUDY Patient Name:  Logan French  Date of Exam:   06/19/2023 Medical Rec #: [id number]           Accession #:    [id number] Date of Birth: 11/07/1934           Patient Gender: M Patient Age:   41 years Exam Location:  [facility] Procedure:      VAS [place] ABI WITH/WO TBI Referring Phys: Emilie Rutter --------------------------------------------------------------------------------  Indications: Ulceration. Cellulitis High Risk Factors: Hypertension, hyperlipidemia. Other Factors: Atrial fibrillation.  Vascular Interventions: Abdominal aortogram with LE peripheral intravascular                         lithotripsy and balloon angioplasty of the SFA-Pop                         05/17/23. Limitations: Today's exam was limited due to an open wound. Comparison Study: Prior ABI done 04/26/23 Performing Technologist: Sherren Kerns RVS  Examination Guidelines: A complete evaluation includes at minimum, Doppler waveform signals and systolic blood pressure reading at the level of bilateral brachial, anterior tibial, and posterior tibial arteries, when vessel segments are accessible. Bilateral testing is considered an integral part of a complete examination. Photoelectric Plethysmograph (PPG) waveforms and toe systolic pressure readings are included as required and additional duplex testing as needed. Limited examinations for reoccurring indications may be performed as noted.  ABI Findings: +---------+------------------+-----+---------+--------+ Right    Rt Pressure  (mmHg)IndexWaveform Comment  +---------+------------------+-----+---------+--------+ Brachial 110                    triphasic         +---------+------------------+-----+---------+--------+ PTA                             absent            +---------+------------------+-----+---------+--------+  DP                              biphasic          +---------+------------------+-----+---------+--------+ Great Toe72                0.62                   +---------+------------------+-----+---------+--------+ +---------+------------------+-----+---------+-------+ Left     Lt Pressure (mmHg)IndexWaveform Comment +---------+------------------+-----+---------+-------+ Brachial 117                    triphasic        +---------+------------------+-----+---------+-------+ PTA      115               0.98 biphasic         +---------+------------------+-----+---------+-------+ DP       113               0.97 biphasic         +---------+------------------+-----+---------+-------+ Great Toe41                0.35                  +---------+------------------+-----+---------+-------+ +-------+----------------+-----------+------------------+------------+ ABI/TBIToday's ABI     Today's TBIPrevious ABI      Previous TBI +-------+----------------+-----------+------------------+------------+ Right  AT and DP patent0.62       0.73 (occluded PT)0.45         +-------+----------------+-----------+------------------+------------+ Left   0.98            0.35       0.85              0.70         +-------+----------------+-----------+------------------+------------+ Bilateral TBIs appear decreased compared to prior study on 04/26/23. Left ABIs appear increased compared to prior study on 04/26/23.  Summary: Right: The right toe-brachial index is abnormal. Left: Resting left ankle-brachial index is within normal range. The left toe-brachial index is abnormal. *See table(s)  above for measurements and observations.  Electronically signed by Coral Else MD on 06/19/2023 at 2:11:36 PM.    Final    VAS [place] LOWER EXTREMITY VENOUS (DVT) Result Date: 06/19/2023  Lower Venous DVT Study Patient Name:  Logan French  Date of Exam:   06/19/2023 Medical Rec #: [id number]           Accession #:    [id number] Date of Birth: [birth year]           Patient Gender: M Patient Age:   44 years Exam Location:  [facility] Procedure:      VAS [place] LOWER EXTREMITY VENOUS (DVT) Referring Phys: Odette Fraction --------------------------------------------------------------------------------  Indications: Pain, Swelling, Erythema, and Cellulitis. Open wound on achilles heel.  Limitations: Edema, pain with compression maneuvers/touch. Comparison Study: No prior study on file Performing Technologist: Sherren Kerns RVS  Examination Guidelines: A complete evaluation includes B-mode imaging, spectral Doppler, color Doppler, and power Doppler as needed of all accessible portions of each vessel. Bilateral testing is considered an integral part of a complete examination. Limited examinations for reoccurring indications may be performed as noted. The reflux portion of the exam is performed with the patient in reverse Trendelenburg.  +---------+---------------+---------+-----------+---------------+--------------+ RIGHT    CompressibilityPhasicitySpontaneityProperties     Thrombus Aging +---------+---------------+---------+-----------+---------------+--------------+ CFV      Full           Yes  No         pulsatile                                                                 waveforms                     +---------+---------------+---------+-----------+---------------+--------------+ SFJ      Full                                                             +---------+---------------+---------+-----------+---------------+--------------+ FV Prox                 Yes      No          pulsatile                                                                 waveforms                     +---------+---------------+---------+-----------+---------------+--------------+ FV Mid                  Yes      No         pulsatile                                                                 waveforms                     +---------+---------------+---------+-----------+---------------+--------------+ FV Distal               Yes      No         pulsatile                                                                 waveforms                     +---------+---------------+---------+-----------+---------------+--------------+ PFV                     Yes      No         pulsatile  waveforms                     +---------+---------------+---------+-----------+---------------+--------------+ POP                     Yes      No         pulsatile                                                                 waveforms                     +---------+---------------+---------+-----------+---------------+--------------+ PTV                                                        Not well                                                                  visualized     +---------+---------------+---------+-----------+---------------+--------------+ PERO                                                       Not well                                                                  visualized     +---------+---------------+---------+-----------+---------------+--------------+ Gastroc                 Yes      No                                       +---------+---------------+---------+-----------+---------------+--------------+   +----+---------------+---------+-----------+----------+--------------+  LEFTCompressibilityPhasicitySpontaneityPropertiesThrombus Aging +----+---------------+---------+-----------+----------+--------------+ CFV Full           Yes      Yes                                 +----+---------------+---------+-----------+----------+--------------+     Summary: RIGHT: - There is no evidence of deep vein thrombosis in the lower extremity. However, portions of this examination were limited- see technologist comments above.  - No cystic structure found in the popliteal fossa. pulsatile waveforms noted  LEFT: - No evidence of common femoral vein obstruction.   *See table(s) above for measurements and observations. Electronically signed by Coral Else MD on 06/19/2023 at 2:05:25 PM.  Final    DG Tibia/Fibula Right Port Result Date: 06/19/2023 CLINICAL DATA:  Leg pain. EXAM: PORTABLE RIGHT TIBIA AND FIBULA - 2 VIEW COMPARISON:  CT 06/17/2023 FINDINGS: No fracture. No erosive or bony destructive change. Knee osteoarthritis and chondrocalcinosis again seen. Again seen calcifications in the region of the Achilles tendon. There is generalized subcutaneous edema. Arterial vascular calcifications. No soft tissue gas. IMPRESSION: 1. Generalized subcutaneous edema. No acute osseous abnormality. 2. Knee osteoarthritis and chondrocalcinosis. Electronically Signed   By: Narda Rutherford M.D.   On: 06/19/2023 10:05   ECHOCARDIOGRAM COMPLETE Result Date: 06/18/2023    ECHOCARDIOGRAM REPORT   Patient Name:   Logan French Date of Exam: 06/18/2023 Medical Rec #:  [id number]          Height:       72.0 in Accession #:    [id number]         Weight:       216.9 lb Date of Birth:  02-Jun-1934          BSA:          2.205 m Patient Age:    88 years           BP:           111/74 mmHg Patient Gender: M                  HR:           68 bpm. Exam Location:  Inpatient Procedure: 2D Echo, Cardiac Doppler and Color Doppler (Both Spectral and Color            Flow Doppler were utilized during procedure).  Indications:    Bacteremia  History:        Patient has prior history of Echocardiogram examinations, most                 recent 09/08/2021. Arrythmias:Atrial Fibrillation,                 Signs/Symptoms:Edema, Fatigue, Dizziness/Lightheadedness and                 Syncope; Risk Factors:Hypertension and Dyslipidemia.  Sonographer:    Vern Claude Referring Phys: [id number] Dewayne Shorter M GHIMIRE IMPRESSIONS  1. Left ventricular ejection fraction, by estimation, is 50%. The left ventricle has mildly decreased function. The left ventricle demonstrates regional wall motion abnormalities with suspected basal to mid inferolateral and basal anterolateral hypokinesis. However, endocardial definition was poor. Definity contrast may have helped but was not used. The left ventricular internal cavity size was mildly dilated. Left ventricular diastolic parameters are indeterminate.  2. Right ventricular systolic function is mildly reduced. The right ventricular size is normal.  3. Left atrial size was mildly dilated.  4. The mitral valve is normal in structure. Mild mitral valve regurgitation. No evidence of mitral stenosis.  5. The aortic valve is tricuspid. There is moderate calcification of the aortic valve. Aortic valve regurgitation is trivial. Aortic valve sclerosis/calcification is present, without any evidence of aortic stenosis.  6. Aortic dilatation noted. There is mild dilatation of the aortic root, measuring 39 mm.  7. The IVC was not visualized.  8. No definite valvular vegetation was visualized, but technically difficult study, and if endocarditis is suspected will need TEE. FINDINGS  Left Ventricle: Left ventricular ejection fraction, by estimation, is 50%. The left ventricle has mildly decreased function. The left ventricle demonstrates regional wall motion abnormalities. The left ventricular internal cavity size was mildly  dilated. There is no left ventricular hypertrophy. Left ventricular diastolic parameters are  indeterminate. Right Ventricle: The right ventricular size is normal. No increase in right ventricular wall thickness. Right ventricular systolic function is mildly reduced. Left Atrium: Left atrial size was mildly dilated. Right Atrium: Right atrial size was normal in size. Pericardium: There is no evidence of pericardial effusion. Mitral Valve: The mitral valve is normal in structure. There is mild calcification of the mitral valve leaflet(s). Mild mitral annular calcification. Mild mitral valve regurgitation. No evidence of mitral valve stenosis. MV peak gradient, 6.0 mmHg. The mean mitral valve gradient is 1.0 mmHg. Tricuspid Valve: The tricuspid valve is normal in structure. Tricuspid valve regurgitation is mild. Aortic Valve: The aortic valve is tricuspid. There is moderate calcification of the aortic valve. Aortic valve regurgitation is trivial. Aortic valve sclerosis/calcification is present, without any evidence of aortic stenosis. Aortic valve mean gradient measures 4.3 mmHg. Aortic valve peak gradient measures 7.8 mmHg. Aortic valve area, by VTI measures 1.59 cm. Pulmonic Valve: The pulmonic valve was normal in structure. Pulmonic valve regurgitation is trivial. Aorta: Aortic dilatation noted. There is mild dilatation of the aortic root, measuring 39 mm. Venous: The IVC was not visualized. IAS/Shunts: No atrial level shunt detected by color flow Doppler.  LEFT VENTRICLE PLAX 2D LVIDd:         6.30 cm      Diastology LVIDs:         4.00 cm      LV e' medial:    9.79 cm/s LV PW:         0.80 cm      LV E/e' medial:  10.4 LV IVS:        0.90 cm      LV e' lateral:   13.70 cm/s LVOT diam:     2.00 cm      LV E/e' lateral: 7.4 LV SV:         32 LV SV Index:   14 LVOT Area:     3.14 cm  LV Volumes (MOD) LV vol d, MOD A2C: 112.0 ml LV vol d, MOD A4C: 122.0 ml LV vol s, MOD A2C: 44.8 ml LV vol s, MOD A4C: 47.6 ml LV SV MOD A2C:     67.2 ml LV SV MOD A4C:     122.0 ml LV SV MOD BP:      68.8 ml RIGHT VENTRICLE  RV Basal diam:  3.40 cm RV Mid diam:    2.30 cm RV S prime:     9.17 cm/s LEFT ATRIUM             Index        RIGHT ATRIUM           Index LA diam:        5.30 cm 2.40 cm/m   RA Area:     16.60 cm LA Vol (A2C):   48.1 ml 21.81 ml/m  RA Volume:   35.00 ml  15.87 ml/m LA Vol (A4C):   53.6 ml 24.30 ml/m LA Biplane Vol: 54.3 ml 24.62 ml/m  AORTIC VALVE                    PULMONIC VALVE AV Area (Vmax):    1.46 cm     PV Vmax:       0.88 m/s AV Area (Vmean):   1.29 cm     PV Peak grad:  3.1 mmHg AV Area (VTI):  1.59 cm AV Vmax:           139.33 cm/s AV Vmean:          93.500 cm/s AV VTI:            0.200 m AV Peak Grad:      7.8 mmHg AV Mean Grad:      4.3 mmHg LVOT Vmax:         64.90 cm/s LVOT Vmean:        38.500 cm/s LVOT VTI:          0.101 m LVOT/AV VTI ratio: 0.51  AORTA Ao Root diam: 3.90 cm Ao Asc diam:  3.30 cm MITRAL VALVE                TRICUSPID VALVE MV Area (PHT): 3.66 cm     TR Peak grad:   24.4 mmHg MV Area VTI:   1.50 cm     TR Vmax:        247.00 cm/s MV Peak grad:  6.0 mmHg MV Mean grad:  1.0 mmHg     SHUNTS MV Vmax:       1.22 m/s     Systemic VTI:  0.10 m MV Vmean:      50.1 cm/s    Systemic Diam: 2.00 cm MV Decel Time: 207 msec MR Peak grad: 105.5 mmHg MR Mean grad: 69.0 mmHg MR Vmax:      513.50 cm/s MR Vmean:     388.5 cm/s MV E velocity: 102.00 cm/s Dalton McleanMD Electronically signed by Wilfred Lacy Signature Date/Time: 06/18/2023/3:10:23 PM    Final    - pertinent xrays, CT, MRI studies were reviewed and independently interpreted  Positive ROS: All other systems have been reviewed and were otherwise negative with the exception of those mentioned in the HPI and as above.  Physical Exam: General: Alert, no acute distress Psychiatric: Patient is competent for consent with normal mood and affect Lymphatic: No axillary or cervical lymphadenopathy Cardiovascular: No pedal edema Respiratory: No cyanosis, no use of accessory musculature GI: No organomegaly, abdomen is soft  and non-tender    Images:  @ENCIMAGES @  Labs:  Lab Results  Component Value Date   HGBA1C 6.4 (H) 06/17/2023   CRP 25.8 (H) 06/20/2023   REPTSTATUS PENDING 06/19/2023   GRAMSTAIN Rare 10/29/2015   GRAMSTAIN WBC present-predominately Mononuclear 10/29/2015   GRAMSTAIN No Squamous Epithelial Cells Seen 10/29/2015   GRAMSTAIN Moderate Gram Positive Cocci In Pairs In Clusters 10/29/2015   CULT  06/19/2023    NO GROWTH 1 DAY Performed at [facility], [address] [place], [place] [address]    LABORGA GROUP A STREP (S.PYOGENES) ISOLATED 06/17/2023    Lab Results  Component Value Date   ALBUMIN 2.4 (L) 06/18/2023   ALBUMIN 3.4 (L) 06/17/2023   ALBUMIN 3.8 07/16/2022        Latest Ref Rng & Units 06/20/2023    5:51 AM 06/19/2023    5:58 AM 06/18/2023    4:49 AM  CBC EXTENDED  WBC 4.0 - 10.5 K/uL 19.1  18.5  18.8   RBC 4.22 - 5.81 MIL/uL 4.16  4.00  4.10   Hemoglobin 13.0 - 17.0 g/dL 60.4  54.0  98.1   HCT 39.0 - 52.0 % 36.0  34.5  35.9   Platelets 150 - 400 K/uL 227  191  160   NEUT# 1.7 - 7.7 K/uL 16.0  15.7    Lymph# 0.7 - 4.0 K/uL 1.3  1.3      Neurologic: Patient does not have protective sensation bilateral lower extremities.   MUSCULOSKELETAL:   Skin: Examination patient has brawny skin color changes with cellulitis of the right calf.  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  The wound bed is covered with fibrinous exudative tissue.  White cell count is 19.1 and elevated.  Albumin 2.4.  Hemoglobin A1c 6.4.  Induration and cellulitis of the calf only no cellulitis extending proximal to the knee.  Patient has global pain around the left knee.  There is no effusion no cellulitis.  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  Severe protein  caloric malnutrition.  Plan: Plan: I will place orders for Vashe dressing changes daily.  Discussed with patient and family the importance of elevation compression and exercise.  I will place an order for Juven.  I will follow-up in the office in 1 week.  Anticipate discharge to skilled nursing and follow-up in the office in 1 week.  Discussed patient may be a candidate for the venous study.  Thank you for the consult and the opportunity to see Mr. Leilani Able, MD [facility] [phone] 10:10 AM

## 2023-06-20 NOTE — TOC Initial Note (Signed)
 Transition of Care [facility]) - Initial/Assessment Note    Patient Details  Name: Logan French MRN: [id number] Date of Birth: April 29, 1934  Transition of Care [facility] CM/SW Contact:    Lawerance Sabal, RN Phone Number: 06/20/2023, 9:14 AM  Clinical Narrative:                  Logan French w patient at bedside. He is unsure of plans for DC today. He is overwhelmed by choices btwn HH and SNF ([facility] recommended, apparently declined yesterday, may need to revisit as a potential choice tomorrow when he is less overwhelmed by options)  He lives at home w wife, son visiting in room currently.  He goes to the wound care center for leg.  He states that he has access from his church to any DME that he may need. He currently has a cane at home that he only uses sometimes.  Will continue to follow  Anticipate family will transport home.  Expected Discharge Plan:  (TBD) Barriers to Discharge: Continued Medical Work up   Patient Goals and CMS Choice Patient states their goals for this hospitalization and ongoing recovery are:: unsure of goals for DC CMS Medicare.gov Compare Post Acute Care list provided to:: Patient        Expected Discharge Plan and Services   Discharge Planning Services: CM Consult   Living arrangements for the past 2 months: Single Family Home                                      Prior Living Arrangements/Services Living arrangements for the past 2 months: Single Family Home Lives with:: Significant Other              Current home services: Other (comment) (has access to RW, cane, BSC)    Activities of Daily Living   ADL Screening (condition at time of admission) Independently performs ADLs?: Yes (appropriate for developmental age) Is the patient deaf or have difficulty hearing?: No Does the patient have difficulty seeing, even when wearing glasses/contacts?: Yes Does the patient have difficulty concentrating, remembering, or making decisions?: Yes  Permission  Sought/Granted                  Emotional Assessment              Admission diagnosis:  Renal insufficiency [N28.9] Cellulitis of right lower extremity [L03.115] Normochromic normocytic anemia [D64.9] Cellulitis of right lower leg [L03.115] Elevated random blood glucose level [R73.9] Sepsis due to undetermined organism [facility]) [A41.9] Patient Active Problem List   Diagnosis Date Noted   Streptococcal bacteremia 06/18/2023   Cellulitis of right lower extremity 06/17/2023   Persistent atrial fibrillation (HCC) 09/23/2021   Hypercoagulable state due to persistent atrial fibrillation (HCC) 09/23/2021   Callus 10/25/2019   Corns and callosities 10/25/2019   Hav (hallux abducto valgus), unspecified laterality 10/25/2019   Altered awareness, transient 06/23/2017   MALLET FINGER, RIGHT RING FINGER 10/19/2008   PCP:  Garlan Fillers, MD Pharmacy:   [facility] - [place], [place] - [address] [place] [address] Phone: [phone] Fax: [phone]     Social Drivers of Health (SDOH) Social History: SDOH Screenings   Food Insecurity: No Food Insecurity (06/17/2023)  Housing: Low Risk  (06/17/2023)  Transportation Needs: No Transportation Needs (06/17/2023)  Utilities: Not At Risk (06/17/2023)  Social Connections: Socially  Integrated (06/17/2023)  Tobacco Use: Low Risk  (06/17/2023)   SDOH Interventions:     Readmission Risk Interventions     No data to display

## 2023-06-20 NOTE — Progress Notes (Signed)
 Subjective  -   Complaining of left knee pain   Physical Exam:  Dressing removed.  Wound bed has good granulation tissue however significant edema and cellulitis to the right leg    Right leg and left knee x-rays unremarkable   Assessment/Plan:    Right Achilles wound: This appears to be making progress however he has developed infection of the right leg along with edema.  Arterial Doppler studies yesterday suggest that the intervention 1 month ago remains patent.  He has diffuse disease throughout the leg without hemodynamically significant stenosis however all combined it is affecting blood flow to the right leg.  That being said I suspect he has adequate blood flow for healing this wound.  He has been evaluated by Dr. Lajoyce Corners who has recommended Vashe washings to help with the bacterial colonization in addition to his IV antibiotics.  Would recommend right leg elevation above his heart.  He can be up walking but should not be sitting in a chair with the leg in a dependent position.  Wells Marivel Mcclarty 06/20/2023 10:24 AM --  Vitals:   06/20/23 0348 06/20/23 0856  BP: 122/68 114/87  Pulse: 64 70  Resp: 20 (!) 23  Temp:  99.2 F (37.3 C)  SpO2: (!) 82%     Intake/Output Summary (Last 24 hours) at 06/20/2023 1024 Last data filed at 06/20/2023 0700 Gross per 24 hour  Intake 1767.6 ml  Output --  Net 1767.6 ml     Laboratory CBC    Component Value Date/Time   WBC 19.1 (H) 06/20/2023 0551   HGB 11.7 (L) 06/20/2023 0551   HGB 13.4 09/08/2021 1027   HCT 36.0 (L) 06/20/2023 0551   HCT 41.2 09/08/2021 1027   PLT 227 06/20/2023 0551   PLT 270 09/08/2021 1027    BMET    Component Value Date/Time   NA 130 (L) 06/20/2023 0551   NA 142 09/08/2021 1027   K 4.0 06/20/2023 0551   CL 95 (L) 06/20/2023 0551   CO2 24 06/20/2023 0551   GLUCOSE 137 (H) 06/20/2023 0551   BUN 19 06/20/2023 0551   BUN 23 09/08/2021 1027   CREATININE 1.19 06/20/2023 0551   CALCIUM 8.2 (L)  06/20/2023 0551   GFRNONAA 59 (L) 06/20/2023 0551   GFRAA 67 07/14/2019 1020    COAG Lab Results  Component Value Date   INR 1.6 (H) 06/18/2023   INR 2.0 (H) 06/17/2023   INR 1.3 (H) 07/16/2022   No results found for: "PTT"  Antibiotics Anti-infectives (From admission, onward)    Start     Dose/Rate Route Frequency Ordered Stop   06/18/23 1000  penicillin G potassium 9 Million Units in dextrose 5 % 500 mL CONTINUOUS infusion        9 Million Units 41.7 mL/hr over 12 Hours Intravenous Every 12 hours 06/18/23 0848     06/18/23 1000  linezolid (ZYVOX) IVPB 600 mg        600 mg 300 mL/hr over 60 Minutes Intravenous Every 12 hours 06/18/23 0857     06/18/23 0600  cefTRIAXone (ROCEPHIN) 2 g in sodium chloride 0.9 % 100 mL IVPB  Status:  Discontinued        2 g 200 mL/hr over 30 Minutes Intravenous Every 24 hours 06/17/23 1435 06/18/23 0055   06/18/23 0600  penicillin G potassium 4 Million Units in dextrose 5 % 250 mL IVPB  Status:  Discontinued        4 Million Units  250 mL/hr over 60 Minutes Intravenous Every 6 hours 06/18/23 0055 06/18/23 0848   06/17/23 2200  metroNIDAZOLE (FLAGYL) IVPB 500 mg  Status:  Discontinued        500 mg 100 mL/hr over 60 Minutes Intravenous Every 12 hours 06/17/23 2030 06/18/23 0055   06/17/23 1500  metroNIDAZOLE (FLAGYL) IVPB 500 mg  Status:  Discontinued        500 mg 100 mL/hr over 60 Minutes Intravenous Every 12 hours 06/17/23 1401 06/17/23 1435   06/17/23 1500  ceFEPIme (MAXIPIME) 2 g in sodium chloride 0.9 % 100 mL IVPB  Status:  Discontinued        2 g 200 mL/hr over 30 Minutes Intravenous Every 12 hours 06/17/23 1404 06/17/23 1435   06/17/23 1500  vancomycin (VANCOREADY) IVPB 1750 mg/350 mL  Status:  Discontinued        1,750 mg 175 mL/hr over 120 Minutes Intravenous Every 24 hours 06/17/23 1406 06/18/23 0055   06/17/23 0415  cefTRIAXone (ROCEPHIN) 2 g in sodium chloride 0.9 % 100 mL IVPB        2 g 200 mL/hr over 30 Minutes Intravenous   Once 06/17/23 0404 06/17/23 0526        V. Charlena Cross, M.D., [facility] [place] Office: [phone] Pager:  [phone]

## 2023-06-20 NOTE — Progress Notes (Signed)
 Orthopedic Tech Progress Note Patient Details:  Logan French 29-Sep-1934 [id number]  Ortho Devices Type of Ortho Device: Prafo boot/shoe Ortho Device/Splint Location: RLE Ortho Device/Splint Interventions: Ordered   Post Interventions Instructions Provided: Care of device, Adjustment of device Prafo not applied due to patent having an open wound on RLE that has not been cleaned or dressed. Prafo left at bedside to be applied once wound is cared for. Darleen Crocker 06/20/2023, 10:40 AM

## 2023-06-20 NOTE — Plan of Care (Signed)

## 2023-06-20 NOTE — Progress Notes (Addendum)
 PROGRESS NOTE        PATIENT DETAILS Name: Logan French Age: 89 y.o. Sex: male Date of Birth: 08/18/1934 Admit Date: 06/17/2023 Admitting Physician Briscoe Deutscher, MD ZOX:[id number], Barry Dienes, MD  Brief Summary: Patient is a 89 y.o.  male with history of chronic right leg wound-followed by wound care-s/p recent PCI to popliteal artery-presented with severe RLE pain/swelling/erythema following a cat scratch around 3/31.  Patient was found to have sepsis secondary to RLE cellulitis with streptococcal bacteremia and admitted to the hospitalist service.  Significant events: 4/3>> admitted to [facility]  Significant studies: 4/3>> CT right fibula/tibia: Extensive subcutaneous edema-no fluid collections-edematous changes appears superficial to muscular fascia. 4/4>> echo: EF 50%-no obvious vegetation.   Significant microbiology data: 4/3>> blood culture: Group A strep 4/4>> blood culture: No growth  Procedures: None  Consults: ID VVS  Subjective:  Patient in bed, appears comfortable, denies any headache, no fever, no chest pain or pressure, no shortness of breath , no abdominal pain. No new focal weakness.  Having bilateral lower extremity pain.  Objective: Vitals: Blood pressure 114/87, pulse 70, temperature 99.2 F (37.3 C), temperature source Oral, resp. rate (!) 23, height 6' (1.829 m), weight 105.4 kg, SpO2 (!) 82%.   Exam:  Awake Alert, No new F.N deficits, Normal affect [place].AT,PERRAL Supple Neck, No JVD,   Symmetrical Chest wall movement, Good air movement bilaterally, CTAB RRR,No Gallops, Rubs or new Murmurs,  +ve B.Sounds, Abd Soft, No tenderness,   Right lower extremity intense cellulitis with some edema, no signs of compartment syndrome, left knee is examined stable.   Assessment/Plan:  Sepsis secondary to RLE cellulitis and streptococcal bacteremia in a setting of cat scratch Intense right lower extremity cellulitis with group A strep  bacteremia Repeat plain x-ray on 06/19/23 and 06/20/2023-negative for air in tissues. Being seen by VVS, ID and Orthopedics Dr. Lajoyce Corners Remains on IV penicillin G and Zyvox TTE negative for vegetation, Venous Doppler/ABI pending-follow. Requested patient to keep right lower extremity elevated with 3 pillows, nursing orders placed and staff updated, currently no signs of compartment syndrome.  Chronic RLE wound PAD-s/p intravascular lithotripsy of above-knee popliteal artery/angioplasty of right popliteal artery/SFA on 3/3 Appreciate wound care consult Vascular surgery following-pending arterial Doppler/ABI but dorsalis pedis pulse present.  Some left knee pain.  Lower extremity venous duplex negative for DVT, x-ray shows chronic arthritis.  Supportive care.    Persistent atrial fibrillation Telemetry monitoring Rate controlled with oral beta-blocker On Librexa AF trial drug (comparing Eliquis versus milvexian -factor XI inhibitor)  HTN BP stable-continue metoprolol Hold HCTZ for now  HLD Statin  New diagnosis of DM-2 (A1c 6.4 on 4/4) SSI for now Metformin on discharge.  Recent Labs    06/19/23 0737 06/19/23 1614 06/20/23 0741  GLUCAP 137* 224* 145*     BMI Estimated body mass index is 31.51 kg/m as calculated from the following:   Height as of this encounter: 6' (1.829 m).   Weight as of this encounter: 105.4 kg.   Code status:   Code Status: Full Code   DVT Prophylaxis: STUDY - LIBREXIA-AF - apixaban 5 mg or placebo capsule (PI-Sethi)    Family Communication: Spouse, son Lorin Picket, daughter bedside on 06/20/2023 in detail.   Disposition Plan: Status is: Inpatient Remains inpatient appropriate because: Severity of illness   Planned Discharge Destination:CIR   Diet:  Diet Order             Diet Heart Room service appropriate? Yes; Fluid consistency: Thin  Diet effective now                    MEDICATIONS: Scheduled Meds:  clopidogrel  75 mg Oral Daily    docusate sodium  100 mg Oral BID   ezetimibe  10 mg Oral Daily   finasteride  5 mg Oral Daily   insulin aspart  0-9 Units Subcutaneous TID WC   metoprolol succinate  25 mg Oral Q12H   mupirocin ointment   Topical Daily   pantoprazole  40 mg Oral Q1200   polyethylene glycol  17 g Oral Daily   rosuvastatin  20 mg Oral Daily   LIBREXIA-AF apixaban or placebo  5 mg Oral BID   LIBREXIA-AF [id number] (Milvexian) or placebo  1 tablet Oral BID   Continuous Infusions:  linezolid (ZYVOX) IV Stopped (06/19/23 2223)   penicillin G potassium 9 Million Units in dextrose 5 % 500 mL CONTINUOUS infusion 41.7 mL/hr at 06/20/23 0700   PRN Meds:.acetaminophen **OR** acetaminophen, morphine injection, ondansetron **OR** ondansetron (ZOFRAN) IV, mouth rinse, oxyCODONE   I have personally reviewed following labs and imaging studies  LABORATORY DATA: CBC: Recent Labs  Lab 06/17/23 0430 06/18/23 0449 06/19/23 0558 06/20/23 0551  WBC 18.9* 18.8* 18.5* 19.1*  NEUTROABS 16.2*  --  15.7* 16.0*  HGB 12.3* 11.6* 11.4* 11.7*  HCT 37.0* 35.9* 34.5* 36.0*  MCV 85.8 87.6 86.3 86.5  PLT 194 160 191 227    Basic Metabolic Panel: Recent Labs  Lab 06/17/23 0430 06/18/23 0449 06/19/23 0558 06/20/23 0551  NA 134* 135 132* 130*  K 3.7 3.7 3.5 4.0  CL 100 99 98 95*  CO2 22 23 23 24   GLUCOSE 134* 135* 129* 137*  BUN 26* 20 15 19   CREATININE 1.42* 1.21 1.03 1.19  CALCIUM 8.6* 8.3* 8.1* 8.2*  MG  --   --   --  1.7    GFR: Estimated Creatinine Clearance: 55.9 mL/min (by C-G formula based on SCr of 1.19 mg/dL).  Liver Function Tests: Recent Labs  Lab 06/17/23 0430 06/18/23 0449  AST 29 34  ALT 17 19  ALKPHOS 56 56  BILITOT 1.3* 1.5*  PROT 6.1* 5.1*  ALBUMIN 3.4* 2.4*   No results for input(s): "LIPASE", "AMYLASE" in the last 168 hours. No results for input(s): "AMMONIA" in the last 168 hours.  Coagulation Profile: Recent Labs  Lab 06/17/23 0430 06/18/23 0449  INR 2.0* 1.6*     Cardiac Enzymes: No results for input(s): "CKTOTAL", "CKMB", "CKMBINDEX", "TROPONINI" in the last 168 hours.  BNP (last 3 results) No results for input(s): "PROBNP" in the last 8760 hours.  Lipid Profile: No results for input(s): "CHOL", "HDL", "LDLCALC", "TRIG", "CHOLHDL", "LDLDIRECT" in the last 72 hours.  Thyroid Function Tests: No results for input(s): "TSH", "T4TOTAL", "FREET4", "T3FREE", "THYROIDAB" in the last 72 hours.  Anemia Panel: No results for input(s): "VITAMINB12", "FOLATE", "FERRITIN", "TIBC", "IRON", "RETICCTPCT" in the last 72 hours.  Urine analysis:    Component Value Date/Time   COLORURINE YELLOW 06/17/2023 1000   APPEARANCEUR CLEAR 06/17/2023 1000   LABSPEC 1.023 06/17/2023 1000   PHURINE 5.5 06/17/2023 1000   GLUCOSEU NEGATIVE 06/17/2023 1000   HGBUR TRACE (A) 06/17/2023 1000   BILIRUBINUR NEGATIVE 06/17/2023 1000   KETONESUR NEGATIVE 06/17/2023 1000   PROTEINUR 30 (A) 06/17/2023 1000   NITRITE NEGATIVE 06/17/2023 1000  LEUKOCYTESUR NEGATIVE 06/17/2023 1000    Sepsis Labs: Lactic Acid, Venous    Component Value Date/Time   LATICACIDVEN 2.4 (HH) 06/17/2023 0813    MICROBIOLOGY: Recent Results (from the past 240 hours)  Blood Culture (routine x 2)     Status: Abnormal   Collection Time: 06/17/23  4:03 AM   Specimen: BLOOD RIGHT ARM  Result Value Ref Range Status   Specimen Description   Final    BLOOD RIGHT ARM Performed at [facility], [address], [place], [place] [address]    Special Requests   Final    BOTTLES DRAWN AEROBIC AND ANAEROBIC Blood Culture adequate volume Performed at [facility], [address], [place], [place] [address]    Culture  Setup Time   Final    GRAM POSITIVE COCCI IN CHAINS ANAEROBIC BOTTLE ONLY CRITICAL RESULT CALLED TO, READ BACK BY AND VERIFIED WITH: PHARMD G ABBOTT 06/18/2023 @ 0042 BY AB    Culture (A)  Final    GROUP A STREP (S.PYOGENES) ISOLATED HEALTH  DEPARTMENT NOTIFIED Performed at [facility], [address] [place], [place] [address]    Report Status 06/20/2023 FINAL  Final   Organism ID, Bacteria GROUP A STREP (S.PYOGENES) ISOLATED  Final      Susceptibility   Group a strep (s.pyogenes) isolated - MIC*    PENICILLIN <=0.06 SENSITIVE Sensitive     CEFTRIAXONE <=0.12 SENSITIVE Sensitive     ERYTHROMYCIN 2 RESISTANT Resistant     LEVOFLOXACIN 0.5 SENSITIVE Sensitive     VANCOMYCIN 0.5 SENSITIVE Sensitive     * GROUP A STREP (S.PYOGENES) ISOLATED  Blood Culture ID Panel (Reflexed)     Status: Abnormal   Collection Time: 06/17/23  4:03 AM  Result Value Ref Range Status   Enterococcus faecalis NOT DETECTED NOT DETECTED Final   Enterococcus Faecium NOT DETECTED NOT DETECTED Final   Listeria monocytogenes NOT DETECTED NOT DETECTED Final   Staphylococcus species NOT DETECTED NOT DETECTED Final   Staphylococcus aureus (BCID) NOT DETECTED NOT DETECTED Final   Staphylococcus epidermidis NOT DETECTED NOT DETECTED Final   Staphylococcus lugdunensis NOT DETECTED NOT DETECTED Final   Streptococcus species DETECTED (A) NOT DETECTED Final    Comment: CRITICAL RESULT CALLED TO, READ BACK BY AND VERIFIED WITH: PHARMD G ABBOTT 06/18/2023 @ 0042 BY AB    Streptococcus agalactiae NOT DETECTED NOT DETECTED Final   Streptococcus pneumoniae NOT DETECTED NOT DETECTED Final   Streptococcus pyogenes DETECTED (A) NOT DETECTED Final    Comment: CRITICAL RESULT CALLED TO, READ BACK BY AND VERIFIED WITH: PHARMD G ABBOTT 06/18/2023 @ 0042 BY AB    A.calcoaceticus-baumannii NOT DETECTED NOT DETECTED Final   Bacteroides fragilis NOT DETECTED NOT DETECTED Final   Enterobacterales NOT DETECTED NOT DETECTED Final   Enterobacter cloacae complex NOT DETECTED NOT DETECTED Final   Escherichia coli NOT DETECTED NOT DETECTED Final   Klebsiella aerogenes NOT DETECTED NOT DETECTED Final   Klebsiella oxytoca NOT DETECTED NOT DETECTED Final   Klebsiella  pneumoniae NOT DETECTED NOT DETECTED Final   Proteus species NOT DETECTED NOT DETECTED Final   Salmonella species NOT DETECTED NOT DETECTED Final   Serratia marcescens NOT DETECTED NOT DETECTED Final   Haemophilus influenzae NOT DETECTED NOT DETECTED Final   Neisseria meningitidis NOT DETECTED NOT DETECTED Final   Pseudomonas aeruginosa NOT DETECTED NOT DETECTED Final   Stenotrophomonas maltophilia NOT DETECTED NOT DETECTED Final   Candida albicans NOT DETECTED NOT DETECTED Final   Candida  auris NOT DETECTED NOT DETECTED Final   Candida glabrata NOT DETECTED NOT DETECTED Final   Candida krusei NOT DETECTED NOT DETECTED Final   Candida parapsilosis NOT DETECTED NOT DETECTED Final   Candida tropicalis NOT DETECTED NOT DETECTED Final   Cryptococcus neoformans/gattii NOT DETECTED NOT DETECTED Final    Comment: Performed at [facility], [address], [place], [place] [address]  Blood Culture (routine x 2)     Status: Abnormal   Collection Time: 06/17/23  4:08 AM   Specimen: BLOOD RIGHT HAND  Result Value Ref Range Status   Specimen Description   Final    BLOOD RIGHT HAND Performed at [facility], [address] [place], [place] [address]    Special Requests   Final    BOTTLES DRAWN AEROBIC AND ANAEROBIC Blood Culture adequate volume Performed at [facility], [address], [place], [place] [address]    Culture  Setup Time   Final    GRAM POSITIVE COCCI IN PAIRS IN CHAINS AEROBIC BOTTLE ONLY CRITICAL VALUE NOTED.  VALUE IS CONSISTENT WITH PREVIOUSLY REPORTED AND CALLED VALUE.    Culture (A)  Final    GROUP A STREP (S.PYOGENES) ISOLATED SUSCEPTIBILITIES PERFORMED ON PREVIOUS CULTURE WITHIN THE LAST 5 DAYS. Performed at [facility], [address] [place], [place] [address]    Report Status 06/20/2023 FINAL  Final  Culture, blood (Routine X 2) w Reflex to ID Panel     Status: None (Preliminary result)   Collection Time:  06/18/23  6:10 PM   Specimen: BLOOD  Result Value Ref Range Status   Specimen Description BLOOD SITE NOT SPECIFIED  Final   Special Requests   Final    BOTTLES DRAWN AEROBIC AND ANAEROBIC Blood Culture adequate volume   Culture   Final    NO GROWTH 2 DAYS Performed at [facility], [address], [place], [place] [address]    Report Status PENDING  Incomplete  Culture, blood (Routine X 2) w Reflex to ID Panel     Status: None (Preliminary result)   Collection Time: 06/18/23  6:12 PM   Specimen: BLOOD  Result Value Ref Range Status   Specimen Description BLOOD SITE NOT SPECIFIED  Final   Special Requests   Final    BOTTLES DRAWN AEROBIC AND ANAEROBIC Blood Culture results may not be optimal due to an inadequate volume of blood received in culture bottles   Culture   Final    NO GROWTH 2 DAYS Performed at [facility], [address], [place], [place] [address]    Report Status PENDING  Incomplete  Culture, blood (Routine X 2) w Reflex to ID Panel     Status: None (Preliminary result)   Collection Time: 06/19/23  5:56 AM   Specimen: BLOOD  Result Value Ref Range Status   Specimen Description BLOOD BLOOD LEFT HAND  Final   Special Requests   Final    BOTTLES DRAWN AEROBIC AND ANAEROBIC Blood Culture results may not be optimal due to an inadequate volume of blood received in culture bottles   Culture   Final    NO GROWTH 1 DAY Performed at [facility], [address] [place], [place] [address]    Report Status PENDING  Incomplete  Culture, blood (Routine X 2) w Reflex to ID Panel     Status: None (Preliminary result)   Collection Time: 06/19/23  5:57 AM   Specimen: BLOOD  Result Value Ref Range Status   Specimen Description BLOOD BLOOD LEFT ARM  Final   Special Requests   Final    BOTTLES DRAWN AEROBIC AND ANAEROBIC Blood Culture results may not be optimal due to an inadequate volume of blood received in culture bottles   Culture   Final    NO GROWTH 1  DAY Performed at [facility], [address], [place], [place] [address]    Report Status PENDING  Incomplete    RADIOLOGY STUDIES/RESULTS: DG Tibia/Fibula Right Result Date: 06/20/2023 CLINICAL DATA:  Lower extremity cellulitis. EXAM: RIGHT TIBIA AND FIBULA - 2 VIEW COMPARISON:  None Available. FINDINGS: No evidence for an acute fracture. No bony destruction in the tibia or fibula to suggest osteomyelitis. Mass degenerative changes are seen at the knee. No soft tissue gas evident in the leg. IMPRESSION: 1. No findings to suggest osteomyelitis in the tibia or fibula 2. Degenerative changes at the knee. Electronically Signed   By: Kennith Center M.D.   On: 06/20/2023 08:03   DG Knee Complete 4 Views Left Result Date: 06/20/2023 CLINICAL DATA:  Recurrent left knee pain. EXAM: LEFT KNEE - COMPLETE 4+ VIEW COMPARISON:  None Available. FINDINGS: No evidence of fracture, dislocation, or joint effusion. Minimal spurring is seen involving medial and lateral joint spaces. Chondrocalcinosis is also noted most consistent with early degenerative joint disease. Vascular calcifications are noted. IMPRESSION: Probable mild degenerative joint disease. No acute abnormality seen. Electronically Signed   By: Lupita Raider M.D.   On: 06/20/2023 08:03   VAS [place] LOWER EXTREMITY ARTERIAL DUPLEX Result Date: 06/19/2023 LOWER EXTREMITY ARTERIAL DUPLEX STUDY Patient Name:  Logan French  Date of Exam:   06/19/2023 Medical Rec #: [id number]           Accession #:    [id number] Date of Birth: [birth year]           Patient Gender: M Patient Age:   37 years Exam Location:  [facility] Procedure:      VAS [place] LOWER EXTREMITY ARTERIAL DUPLEX Referring Phys: Emilie Rutter --------------------------------------------------------------------------------  Indications: Ulceration. Cellulitis. High Risk Factors: Hypertension, hyperlipidemia. Other Factors: Atrial fibrillation.  Vascular Interventions: Abdominal aortogram with  LE peripheral                         intravascular lithotripsy and balloon angioplasty of the                         SFA-Pop                          05/17/23. Current ABI:            R:patent DP and AT TBI:0.62 L:0.98 TBI 0.35 Limitations: Edema and pain with touch secondary to infection Comparison Study: Prior right LE arterial duplex done 04/26/23 Performing Technologist: Sherren Kerns RVS  Examination Guidelines: A complete evaluation includes B-mode imaging, spectral Doppler, color Doppler, and power Doppler as needed of all accessible portions of each vessel. Bilateral testing is considered an integral part of a complete examination. Limited examinations for reoccurring indications may be performed as noted.  +----------+--------+-----+---------------+-----------+--------+ RIGHT     PSV cm/sRatioStenosis       Waveform   Comments +----------+--------+-----+---------------+-----------+--------+ CFA Distal65  multiphasic         +----------+--------+-----+---------------+-----------+--------+ DFA       58                          multiphasic         +----------+--------+-----+---------------+-----------+--------+ SFA Prox  124                         biphasic            +----------+--------+-----+---------------+-----------+--------+ SFA Mid   264          30-49% stenosisbiphasic            +----------+--------+-----+---------------+-----------+--------+ SFA Distal235          30-49% stenosisbiphasic            +----------+--------+-----+---------------+-----------+--------+ POP Prox  80                          monophasic          +----------+--------+-----+---------------+-----------+--------+ POP Mid   66                          monophasic          +----------+--------+-----+---------------+-----------+--------+ POP Distal60                          monophasic           +----------+--------+-----+---------------+-----------+--------+ TP Trunk  116                         monophasic          +----------+--------+-----+---------------+-----------+--------+ ATA Prox  129                         monophasic          +----------+--------+-----+---------------+-----------+--------+ ATA Mid   133                         monophasic          +----------+--------+-----+---------------+-----------+--------+ ATA Distal105                         monophasic          +----------+--------+-----+---------------+-----------+--------+ PTA Prox               occluded                           +----------+--------+-----+---------------+-----------+--------+ PTA Mid                occluded                           +----------+--------+-----+---------------+-----------+--------+ PTA Distal             occluded                           +----------+--------+-----+---------------+-----------+--------+  Summary: Right: 30-49% stenosis noted in the mid and distal superficial femoral artery. PTA remains occluded.  See table(s) above for measurements and observations.    Preliminary    VAS [place] ABI WITH/WO TBI Result Date: 06/19/2023  LOWER EXTREMITY DOPPLER STUDY Patient Name:  Bryan Lemma  Date of Exam:   06/19/2023 Medical Rec #: [id number]           Accession #:    [id number] Date of Birth: 06/29/1934           Patient Gender: M Patient Age:   5 years Exam Location:  [facility] Procedure:      VAS [place] ABI WITH/WO TBI Referring Phys: Emilie Rutter --------------------------------------------------------------------------------  Indications: Ulceration. Cellulitis High Risk Factors: Hypertension, hyperlipidemia. Other Factors: Atrial fibrillation.  Vascular Interventions: Abdominal aortogram with LE peripheral intravascular                         lithotripsy and balloon angioplasty of the SFA-Pop                         05/17/23. Limitations:  Today's exam was limited due to an open wound. Comparison Study: Prior ABI done 04/26/23 Performing Technologist: Sherren Kerns RVS  Examination Guidelines: A complete evaluation includes at minimum, Doppler waveform signals and systolic blood pressure reading at the level of bilateral brachial, anterior tibial, and posterior tibial arteries, when vessel segments are accessible. Bilateral testing is considered an integral part of a complete examination. Photoelectric Plethysmograph (PPG) waveforms and toe systolic pressure readings are included as required and additional duplex testing as needed. Limited examinations for reoccurring indications may be performed as noted.  ABI Findings: +---------+------------------+-----+---------+--------+ Right    Rt Pressure (mmHg)IndexWaveform Comment  +---------+------------------+-----+---------+--------+ Brachial 110                    triphasic         +---------+------------------+-----+---------+--------+ PTA                             absent            +---------+------------------+-----+---------+--------+ DP                              biphasic          +---------+------------------+-----+---------+--------+ Great Toe72                0.62                   +---------+------------------+-----+---------+--------+ +---------+------------------+-----+---------+-------+ Left     Lt Pressure (mmHg)IndexWaveform Comment +---------+------------------+-----+---------+-------+ Brachial 117                    triphasic        +---------+------------------+-----+---------+-------+ PTA      115               0.98 biphasic         +---------+------------------+-----+---------+-------+ DP       113               0.97 biphasic         +---------+------------------+-----+---------+-------+ Great Toe41                0.35                  +---------+------------------+-----+---------+-------+  +-------+----------------+-----------+------------------+------------+ ABI/TBIToday's ABI     Today's TBIPrevious ABI      Previous TBI +-------+----------------+-----------+------------------+------------+ Right  AT and DP patent0.62       0.73 (occluded PT)0.45         +-------+----------------+-----------+------------------+------------+  Left   0.98            0.35       0.85              0.70         +-------+----------------+-----------+------------------+------------+ Bilateral TBIs appear decreased compared to prior study on 04/26/23. Left ABIs appear increased compared to prior study on 04/26/23.  Summary: Right: The right toe-brachial index is abnormal. Left: Resting left ankle-brachial index is within normal range. The left toe-brachial index is abnormal. *See table(s) above for measurements and observations.  Electronically signed by Coral Else MD on 06/19/2023 at 2:11:36 PM.    Final    VAS [place] LOWER EXTREMITY VENOUS (DVT) Result Date: 06/19/2023  Lower Venous DVT Study Patient Name:  TEEGAN BRANDIS  Date of Exam:   06/19/2023 Medical Rec #: [id number]           Accession #:    [id number] Date of Birth: [birth year]           Patient Gender: M Patient Age:   69 years Exam Location:  [facility] Procedure:      VAS [place] LOWER EXTREMITY VENOUS (DVT) Referring Phys: Odette Fraction --------------------------------------------------------------------------------  Indications: Pain, Swelling, Erythema, and Cellulitis. Open wound on achilles heel.  Limitations: Edema, pain with compression maneuvers/touch. Comparison Study: No prior study on file Performing Technologist: Sherren Kerns RVS  Examination Guidelines: A complete evaluation includes B-mode imaging, spectral Doppler, color Doppler, and power Doppler as needed of all accessible portions of each vessel. Bilateral testing is considered an integral part of a complete examination. Limited examinations for reoccurring indications  may be performed as noted. The reflux portion of the exam is performed with the patient in reverse Trendelenburg.  +---------+---------------+---------+-----------+---------------+--------------+ RIGHT    CompressibilityPhasicitySpontaneityProperties     Thrombus Aging +---------+---------------+---------+-----------+---------------+--------------+ CFV      Full           Yes      No         pulsatile                                                                 waveforms                     +---------+---------------+---------+-----------+---------------+--------------+ SFJ      Full                                                             +---------+---------------+---------+-----------+---------------+--------------+ FV Prox                 Yes      No         pulsatile  waveforms                     +---------+---------------+---------+-----------+---------------+--------------+ FV Mid                  Yes      No         pulsatile                                                                 waveforms                     +---------+---------------+---------+-----------+---------------+--------------+ FV Distal               Yes      No         pulsatile                                                                 waveforms                     +---------+---------------+---------+-----------+---------------+--------------+ PFV                     Yes      No         pulsatile                                                                 waveforms                     +---------+---------------+---------+-----------+---------------+--------------+ POP                     Yes      No         pulsatile                                                                 waveforms                      +---------+---------------+---------+-----------+---------------+--------------+ PTV                                                        Not well  visualized     +---------+---------------+---------+-----------+---------------+--------------+ PERO                                                       Not well                                                                  visualized     +---------+---------------+---------+-----------+---------------+--------------+ Gastroc                 Yes      No                                       +---------+---------------+---------+-----------+---------------+--------------+   +----+---------------+---------+-----------+----------+--------------+ LEFTCompressibilityPhasicitySpontaneityPropertiesThrombus Aging +----+---------------+---------+-----------+----------+--------------+ CFV Full           Yes      Yes                                 +----+---------------+---------+-----------+----------+--------------+     Summary: RIGHT: - There is no evidence of deep vein thrombosis in the lower extremity. However, portions of this examination were limited- see technologist comments above.  - No cystic structure found in the popliteal fossa. pulsatile waveforms noted  LEFT: - No evidence of common femoral vein obstruction.   *See table(s) above for measurements and observations. Electronically signed by Coral Else MD on 06/19/2023 at 2:05:25 PM.    Final    DG Tibia/Fibula Right Port Result Date: 06/19/2023 CLINICAL DATA:  Leg pain. EXAM: PORTABLE RIGHT TIBIA AND FIBULA - 2 VIEW COMPARISON:  CT 06/17/2023 FINDINGS: No fracture. No erosive or bony destructive change. Knee osteoarthritis and chondrocalcinosis again seen. Again seen calcifications in the region of the Achilles tendon. There is generalized subcutaneous edema. Arterial vascular calcifications. No  soft tissue gas. IMPRESSION: 1. Generalized subcutaneous edema. No acute osseous abnormality. 2. Knee osteoarthritis and chondrocalcinosis. Electronically Signed   By: Narda Rutherford M.D.   On: 06/19/2023 10:05   ECHOCARDIOGRAM COMPLETE Result Date: 06/18/2023    ECHOCARDIOGRAM REPORT   Patient Name:   Logan French Date of Exam: 06/18/2023 Medical Rec #:  [id number]          Height:       72.0 in Accession #:    [id number]         Weight:       216.9 lb Date of Birth:  29-Sep-1934          BSA:          2.205 m Patient Age:    88 years           BP:           111/74 mmHg Patient Gender: M                  HR:           68 bpm. Exam Location:  Inpatient Procedure: 2D Echo, Cardiac Doppler and Color Doppler (Both  Spectral and Color            Flow Doppler were utilized during procedure). Indications:    Bacteremia  History:        Patient has prior history of Echocardiogram examinations, most                 recent 09/08/2021. Arrythmias:Atrial Fibrillation,                 Signs/Symptoms:Edema, Fatigue, Dizziness/Lightheadedness and                 Syncope; Risk Factors:Hypertension and Dyslipidemia.  Sonographer:    Vern Claude Referring Phys: [id number] Dewayne Shorter M GHIMIRE IMPRESSIONS  1. Left ventricular ejection fraction, by estimation, is 50%. The left ventricle has mildly decreased function. The left ventricle demonstrates regional wall motion abnormalities with suspected basal to mid inferolateral and basal anterolateral hypokinesis. However, endocardial definition was poor. Definity contrast may have helped but was not used. The left ventricular internal cavity size was mildly dilated. Left ventricular diastolic parameters are indeterminate.  2. Right ventricular systolic function is mildly reduced. The right ventricular size is normal.  3. Left atrial size was mildly dilated.  4. The mitral valve is normal in structure. Mild mitral valve regurgitation. No evidence of mitral stenosis.  5. The aortic valve is  tricuspid. There is moderate calcification of the aortic valve. Aortic valve regurgitation is trivial. Aortic valve sclerosis/calcification is present, without any evidence of aortic stenosis.  6. Aortic dilatation noted. There is mild dilatation of the aortic root, measuring 39 mm.  7. The IVC was not visualized.  8. No definite valvular vegetation was visualized, but technically difficult study, and if endocarditis is suspected will need TEE. FINDINGS  Left Ventricle: Left ventricular ejection fraction, by estimation, is 50%. The left ventricle has mildly decreased function. The left ventricle demonstrates regional wall motion abnormalities. The left ventricular internal cavity size was mildly dilated. There is no left ventricular hypertrophy. Left ventricular diastolic parameters are indeterminate. Right Ventricle: The right ventricular size is normal. No increase in right ventricular wall thickness. Right ventricular systolic function is mildly reduced. Left Atrium: Left atrial size was mildly dilated. Right Atrium: Right atrial size was normal in size. Pericardium: There is no evidence of pericardial effusion. Mitral Valve: The mitral valve is normal in structure. There is mild calcification of the mitral valve leaflet(s). Mild mitral annular calcification. Mild mitral valve regurgitation. No evidence of mitral valve stenosis. MV peak gradient, 6.0 mmHg. The mean mitral valve gradient is 1.0 mmHg. Tricuspid Valve: The tricuspid valve is normal in structure. Tricuspid valve regurgitation is mild. Aortic Valve: The aortic valve is tricuspid. There is moderate calcification of the aortic valve. Aortic valve regurgitation is trivial. Aortic valve sclerosis/calcification is present, without any evidence of aortic stenosis. Aortic valve mean gradient measures 4.3 mmHg. Aortic valve peak gradient measures 7.8 mmHg. Aortic valve area, by VTI measures 1.59 cm. Pulmonic Valve: The pulmonic valve was normal in structure.  Pulmonic valve regurgitation is trivial. Aorta: Aortic dilatation noted. There is mild dilatation of the aortic root, measuring 39 mm. Venous: The IVC was not visualized. IAS/Shunts: No atrial level shunt detected by color flow Doppler.  LEFT VENTRICLE PLAX 2D LVIDd:         6.30 cm      Diastology LVIDs:         4.00 cm      LV e' medial:    9.79 cm/s LV PW:  0.80 cm      LV E/e' medial:  10.4 LV IVS:        0.90 cm      LV e' lateral:   13.70 cm/s LVOT diam:     2.00 cm      LV E/e' lateral: 7.4 LV SV:         32 LV SV Index:   14 LVOT Area:     3.14 cm  LV Volumes (MOD) LV vol d, MOD A2C: 112.0 ml LV vol d, MOD A4C: 122.0 ml LV vol s, MOD A2C: 44.8 ml LV vol s, MOD A4C: 47.6 ml LV SV MOD A2C:     67.2 ml LV SV MOD A4C:     122.0 ml LV SV MOD BP:      68.8 ml RIGHT VENTRICLE RV Basal diam:  3.40 cm RV Mid diam:    2.30 cm RV S prime:     9.17 cm/s LEFT ATRIUM             Index        RIGHT ATRIUM           Index LA diam:        5.30 cm 2.40 cm/m   RA Area:     16.60 cm LA Vol (A2C):   48.1 ml 21.81 ml/m  RA Volume:   35.00 ml  15.87 ml/m LA Vol (A4C):   53.6 ml 24.30 ml/m LA Biplane Vol: 54.3 ml 24.62 ml/m  AORTIC VALVE                    PULMONIC VALVE AV Area (Vmax):    1.46 cm     PV Vmax:       0.88 m/s AV Area (Vmean):   1.29 cm     PV Peak grad:  3.1 mmHg AV Area (VTI):     1.59 cm AV Vmax:           139.33 cm/s AV Vmean:          93.500 cm/s AV VTI:            0.200 m AV Peak Grad:      7.8 mmHg AV Mean Grad:      4.3 mmHg LVOT Vmax:         64.90 cm/s LVOT Vmean:        38.500 cm/s LVOT VTI:          0.101 m LVOT/AV VTI ratio: 0.51  AORTA Ao Root diam: 3.90 cm Ao Asc diam:  3.30 cm MITRAL VALVE                TRICUSPID VALVE MV Area (PHT): 3.66 cm     TR Peak grad:   24.4 mmHg MV Area VTI:   1.50 cm     TR Vmax:        247.00 cm/s MV Peak grad:  6.0 mmHg MV Mean grad:  1.0 mmHg     SHUNTS MV Vmax:       1.22 m/s     Systemic VTI:  0.10 m MV Vmean:      50.1 cm/s    Systemic Diam: 2.00 cm  MV Decel Time: 207 msec MR Peak grad: 105.5 mmHg MR Mean grad: 69.0 mmHg MR Vmax:      513.50 cm/s MR Vmean:     388.5 cm/s MV E velocity: 102.00 cm/s [employer] Electronically signed by Wilfred Lacy Signature  Date/Time: 06/18/2023/3:10:23 PM    Final      LOS: 3 days   Susa Raring, MD  [facility]    To contact the attending provider between 7A-7P or the covering provider during after hours 7P-7A, please log into the web site [email or web address] and access using universal [place] password for that web site. If you do not have the password, please call the hospital operator.  06/20/2023, 9:17 AM

## 2023-06-21 DIAGNOSIS — L03115 Cellulitis of right lower limb: Principal | ICD-10-CM

## 2023-06-21 DIAGNOSIS — B950 Streptococcus, group A, as the cause of diseases classified elsewhere: Secondary | ICD-10-CM

## 2023-06-21 DIAGNOSIS — A419 Sepsis, unspecified organism: Secondary | ICD-10-CM

## 2023-06-21 DIAGNOSIS — L97311 Non-pressure chronic ulcer of right ankle limited to breakdown of skin: Secondary | ICD-10-CM | POA: Diagnosis not present

## 2023-06-21 DIAGNOSIS — R7881 Bacteremia: Secondary | ICD-10-CM | POA: Diagnosis not present

## 2023-06-21 LAB — CBC WITH DIFFERENTIAL/PLATELET
Abs Immature Granulocytes: 0.25 10*3/uL — ABNORMAL HIGH (ref 0.00–0.07)
Basophils Absolute: 0 10*3/uL (ref 0.0–0.1)
Basophils Relative: 0 %
Eosinophils Absolute: 0 10*3/uL (ref 0.0–0.5)
Eosinophils Relative: 0 %
HCT: 34.4 % — ABNORMAL LOW (ref 39.0–52.0)
Hemoglobin: 11.5 g/dL — ABNORMAL LOW (ref 13.0–17.0)
Immature Granulocytes: 2 %
Lymphocytes Relative: 7 %
Lymphs Abs: 1.2 10*3/uL (ref 0.7–4.0)
MCH: 28.7 pg (ref 26.0–34.0)
MCHC: 33.4 g/dL (ref 30.0–36.0)
MCV: 85.8 fL (ref 80.0–100.0)
Monocytes Absolute: 1.3 10*3/uL — ABNORMAL HIGH (ref 0.1–1.0)
Monocytes Relative: 8 %
Neutro Abs: 13.1 10*3/uL — ABNORMAL HIGH (ref 1.7–7.7)
Neutrophils Relative %: 83 %
Platelets: 230 10*3/uL (ref 150–400)
RBC: 4.01 MIL/uL — ABNORMAL LOW (ref 4.22–5.81)
RDW: 15.2 % (ref 11.5–15.5)
WBC: 15.9 10*3/uL — ABNORMAL HIGH (ref 4.0–10.5)
nRBC: 0 % (ref 0.0–0.2)

## 2023-06-21 LAB — BASIC METABOLIC PANEL WITH GFR
Anion gap: 12 (ref 5–15)
BUN: 23 mg/dL (ref 8–23)
CO2: 22 mmol/L (ref 22–32)
Calcium: 7.8 mg/dL — ABNORMAL LOW (ref 8.9–10.3)
Chloride: 93 mmol/L — ABNORMAL LOW (ref 98–111)
Creatinine, Ser: 1.07 mg/dL (ref 0.61–1.24)
GFR, Estimated: >60 mL/min (ref >60)
Glucose, Bld: 129 mg/dL — ABNORMAL HIGH (ref 70–99)
Potassium: 3.9 mmol/L (ref 3.5–5.1)
Sodium: 127 mmol/L — ABNORMAL LOW (ref 135–145)

## 2023-06-21 LAB — CREATININE, URINE, RANDOM: Creatinine, Urine: 212 mg/dL

## 2023-06-21 LAB — OSMOLALITY, URINE: Osmolality, Ur: 964 mosm/kg — ABNORMAL HIGH (ref 300–900)

## 2023-06-21 LAB — GLUCOSE, CAPILLARY
Glucose-Capillary: 147 mg/dL — ABNORMAL HIGH (ref 70–99)
Glucose-Capillary: 151 mg/dL — ABNORMAL HIGH (ref 70–99)
Glucose-Capillary: 127 mg/dL — ABNORMAL HIGH (ref 70–99)
Glucose-Capillary: 140 mg/dL — ABNORMAL HIGH (ref 70–99)

## 2023-06-21 LAB — OSMOLALITY: Osmolality: 270 mosm/kg — ABNORMAL LOW (ref 275–295)

## 2023-06-21 LAB — URIC ACID: Uric Acid, Serum: 5.3 mg/dL (ref 3.7–8.6)

## 2023-06-21 LAB — C-REACTIVE PROTEIN: CRP: 27.6 mg/dL — ABNORMAL HIGH (ref <1.0)

## 2023-06-21 LAB — PROCALCITONIN: Procalcitonin: 1.5 ng/mL

## 2023-06-21 LAB — BRAIN NATRIURETIC PEPTIDE: B Natriuretic Peptide: 546.8 pg/mL — ABNORMAL HIGH (ref 0.0–100.0)

## 2023-06-21 LAB — SODIUM, URINE, RANDOM: Sodium, Ur: <10 mmol/L

## 2023-06-21 LAB — MAGNESIUM: Magnesium: 2 mg/dL (ref 1.7–2.4)

## 2023-06-21 MED ORDER — COLCHICINE 0.6 MG PO TABS
0.6000 mg | ORAL_TABLET | Freq: Every day | ORAL | Status: DC
  Administered 2023-06-21 – 2023-06-25 (×5): 0.6 mg via ORAL
  Filled 2023-06-21 (×5): qty 1

## 2023-06-21 MED ORDER — LACTATED RINGERS IV SOLN: INTRAVENOUS | Status: DC

## 2023-06-21 NOTE — Progress Notes (Signed)
 PROGRESS NOTE        PATIENT DETAILS Name: Logan French Age: 89 y.o. Sex: male Date of Birth: 10-Jul-1934 Admit Date: 06/17/2023 Admitting Physician Briscoe Deutscher, MD ZOX:[id number], Barry Dienes, MD  Brief Summary: Patient is a 89 y.o.  male with history of chronic right leg wound-followed by wound care-s/p recent PCI to popliteal artery-presented with severe RLE pain/swelling/erythema following a cat scratch around 3/31.  Patient was found to have sepsis secondary to RLE cellulitis with streptococcal bacteremia and admitted to the hospitalist service.  Significant events: 4/3>> admitted to [facility]  Significant studies: 4/3>> CT right fibula/tibia: Extensive subcutaneous edema-no fluid collections-edematous changes appears superficial to muscular fascia. 4/4>> echo: EF 50%-no obvious vegetation.   Significant microbiology data: 4/3>> blood culture: Group A strep 4/4>> blood culture: No growth  Procedures: Bilateral lower extremity venous duplex.  Unremarkable. Bilateral lower extremity ABI.  Abnormal   Consults: ID VVS  Subjective:  Patient in bed, appears comfortable, denies any headache, no fever, no chest pain or pressure, no shortness of breath , no abdominal pain. No new focal weakness.  Having bilateral lower extremity pain.  Objective: Vitals: Blood pressure 113/79, pulse 74, temperature 98.1 F (36.7 C), temperature source Oral, resp. rate 18, height 6' (1.829 m), weight 105.4 kg, SpO2 91%.   Exam:  Awake Alert, No new F.N deficits, Normal affect [place].AT,PERRAL Supple Neck, No JVD,   Symmetrical Chest wall movement, Good air movement bilaterally, CTAB RRR,No Gallops, Rubs or new Murmurs,  +ve B.Sounds, Abd Soft, No tenderness,   Right lower extremity intense cellulitis with some edema, no signs of compartment syndrome, left knee is examined stable.   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 Being seen by VVS, ID and Orthopedics Dr. Lajoyce Corners Remains on IV penicillin G and Zyvox TTE negative for vegetation, lateral lower extremity venous duplex unremarkable, ABIs abnormal bilaterally, repeat blood cultures negative thus far. Requested patient to keep right lower extremity elevated with 3 pillows, nursing orders placed and staff updated, currently no signs of compartment syndrome.   PAD-s/p intravascular lithotripsy of above-knee popliteal artery/angioplasty of right popliteal artery/SFA on 3/3 Appreciate wound care consult Vascular surgery following-bilateral ABIs are abnormal  Some left knee pain.  Lower extremity venous duplex negative for DVT, x-ray shows chronic arthritis.  Trial of colchicine and continue to monitor with supportive care.  Uric acid normal limits.  Hyponatremia.  Appears to be dehydrated.  Hydrate with IV fluids and monitor.    Persistent atrial fibrillation Telemetry monitoring Rate controlled with oral beta-blocker On Librexa AF trial drug (comparing Eliquis versus milvexian -factor XI inhibitor)  HTN BP stable-continue metoprolol Hold HCTZ for now  HLD Statin  New diagnosis of DM-2 (A1c 6.4 on 4/4) SSI for now Metformin on discharge.  Recent Labs    06/20/23 1104 06/20/23 1547 06/21/23 0844  GLUCAP 126* 103* 147*     BMI Estimated body mass index is 31.51 kg/m as calculated from the following:   Height as of this encounter: 6' (  1.829 m).   Weight as of this encounter: 105.4 kg.   Code status:   Code Status: Full Code   DVT Prophylaxis: STUDY - LIBREXIA-AF - apixaban 5 mg or placebo capsule (PI-Sethi)     Family Communication: Spouse, son Lorin Picket, daughter bedside on 06/20/2023 in detail.  Called and updated son Lorin Picket [phone]  in detail on 06/21/2023 over the phone   Disposition Plan: Status is: Inpatient Remains inpatient appropriate because: Severity of illness   Planned Discharge Destination:CIR   Diet: Diet Order             Diet Heart Room service appropriate? Yes; Fluid consistency: Thin  Diet effective now                    MEDICATIONS: Scheduled Meds:  clopidogrel  75 mg Oral Daily   colchicine  0.6 mg Oral Daily   docusate sodium  100 mg Oral BID   ezetimibe  10 mg Oral Daily   finasteride  5 mg Oral Daily   insulin aspart  0-9 Units Subcutaneous TID WC   metoprolol succinate  25 mg Oral Q12H   mupirocin ointment   Topical Daily   nutrition supplement (JUVEN)  1 packet Oral BID BM   pantoprazole  40 mg Oral Q1200   polyethylene glycol  17 g Oral Daily   rosuvastatin  20 mg Oral Daily   LIBREXIA-AF apixaban or placebo  5 mg Oral BID   LIBREXIA-AF [id number] (Milvexian) or placebo  1 tablet Oral BID   Continuous Infusions:  lactated ringers     linezolid (ZYVOX) IV 600 mg (06/20/23 2130)   penicillin G potassium 9 Million Units in dextrose 5 % 500 mL CONTINUOUS infusion 9 Million Units (06/20/23 2124)   PRN Meds:.acetaminophen **OR** acetaminophen, guaiFENesin-dextromethorphan, morphine injection, ondansetron **OR** ondansetron (ZOFRAN) IV, mouth rinse, oxyCODONE   I have personally reviewed following labs and imaging studies  LABORATORY DATA:  Recent Labs  Lab 06/17/23 0430 06/18/23 0449 06/19/23 0558 06/20/23 0551 06/21/23 0431  WBC 18.9* 18.8* 18.5* 19.1* 15.9*  HGB 12.3* 11.6* 11.4* 11.7* 11.5*  HCT 37.0* 35.9* 34.5* 36.0* 34.4*  PLT 194 160 191 227 230  MCV 85.8 87.6 86.3 86.5 85.8  MCH 28.5 28.3 28.5 28.1 28.7  MCHC 33.2 32.3 33.0 32.5 33.4  RDW 15.4 15.7* 15.4 15.4 15.2  LYMPHSABS 1.2  --  1.3 1.3 1.2  MONOABS 1.2*  --  1.1* 1.6*  1.3*  EOSABS 0.0  --  0.1 0.0 0.0  BASOSABS 0.0  --  0.1 0.1 0.0    Recent Labs  Lab 06/17/23 0430 06/17/23 0813 06/17/23 1414 06/18/23 0449 06/19/23 0558 06/20/23 0551 06/21/23 0431  NA 134*  --   --  135 132* 130* 127*  K 3.7  --   --  3.7 3.5 4.0 3.9  CL 100  --   --  99 98 95* 93*  CO2 22  --   --  23 23 24 22   ANIONGAP 12  --   --  13 11 11 12   GLUCOSE 134*  --   --  135* 129* 137* 129*  BUN 26*  --   --  20 15 19 23   CREATININE 1.42*  --   --  1.21 1.03 1.19 1.07  AST 29  --   --  34  --   --   --   ALT 17  --   --  19  --   --   --  ALKPHOS 56  --   --  56  --   --   --   BILITOT 1.3*  --   --  1.5*  --   --   --   ALBUMIN 3.4*  --   --  2.4*  --   --   --   CRP  --   --   --   --   --  25.8* 27.6*  PROCALCITON  --   --   --   --  3.88 2.77 1.50  LATICACIDVEN 2.8* 2.4*  --   --   --   --   --   INR 2.0*  --   --  1.6*  --   --   --   HGBA1C  --   --  6.4*  --   --   --   --   BNP  --   --   --   --   --  475.0* 546.8*  MG  --   --   --   --   --  1.7 2.0  CALCIUM 8.6*  --   --  8.3* 8.1* 8.2* 7.8*      Recent Labs  Lab 06/17/23 0430 06/17/23 0813 06/17/23 1414 06/18/23 0449 06/19/23 0558 06/20/23 0551 06/21/23 0431  CRP  --   --   --   --   --  25.8* 27.6*  PROCALCITON  --   --   --   --  3.88 2.77 1.50  LATICACIDVEN 2.8* 2.4*  --   --   --   --   --   INR 2.0*  --   --  1.6*  --   --   --   HGBA1C  --   --  6.4*  --   --   --   --   BNP  --   --   --   --   --  475.0* 546.8*  MG  --   --   --   --   --  1.7 2.0  CALCIUM 8.6*  --   --  8.3* 8.1* 8.2* 7.8*    --------------------------------------------------------------------------------------------------------------- Lab Results  Component Value Date   CHOL 114 07/14/2019   HDL 50 07/14/2019   LDLCALC 45 07/14/2019   TRIG 101 07/14/2019   CHOLHDL 2.3 07/14/2019    Lab Results  Component Value Date   HGBA1C 6.4 (H) 06/17/2023      MICROBIOLOGY: Recent Results (from the past 240 hours)   Blood Culture (routine x 2)     Status: Abnormal   Collection Time: 06/17/23  4:03 AM   Specimen: BLOOD RIGHT ARM  Result Value Ref Range Status   Specimen Description   Final    BLOOD RIGHT ARM Performed at [facility], [address] [place], [place] [address]    Special Requests   Final    BOTTLES DRAWN AEROBIC AND ANAEROBIC Blood Culture adequate volume Performed at [facility], [address], [place], [place] [address]    Culture  Setup Time   Final    GRAM POSITIVE COCCI IN CHAINS ANAEROBIC BOTTLE ONLY CRITICAL RESULT CALLED TO, READ BACK BY AND VERIFIED WITH: PHARMD G ABBOTT 06/18/2023 @ 0042 BY AB    Culture (A)  Final    GROUP A STREP (S.PYOGENES) ISOLATED HEALTH DEPARTMENT NOTIFIED Performed at [facility], [address], [place], [place] [address]    Report Status 06/20/2023  FINAL  Final   Organism ID, Bacteria GROUP A STREP (S.PYOGENES) ISOLATED  Final      Susceptibility   Group a strep (s.pyogenes) isolated - MIC*    PENICILLIN <=0.06 SENSITIVE Sensitive     CEFTRIAXONE <=0.12 SENSITIVE Sensitive     ERYTHROMYCIN 2 RESISTANT Resistant     LEVOFLOXACIN 0.5 SENSITIVE Sensitive     VANCOMYCIN 0.5 SENSITIVE Sensitive     * GROUP A STREP (S.PYOGENES) ISOLATED  Blood Culture ID Panel (Reflexed)     Status: Abnormal   Collection Time: 06/17/23  4:03 AM  Result Value Ref Range Status   Enterococcus faecalis NOT DETECTED NOT DETECTED Final   Enterococcus Faecium NOT DETECTED NOT DETECTED Final   Listeria monocytogenes NOT DETECTED NOT DETECTED Final   Staphylococcus species NOT DETECTED NOT DETECTED Final   Staphylococcus aureus (BCID) NOT DETECTED NOT DETECTED Final   Staphylococcus epidermidis NOT DETECTED NOT DETECTED Final   Staphylococcus lugdunensis NOT DETECTED NOT DETECTED Final   Streptococcus species DETECTED (A) NOT DETECTED Final    Comment: CRITICAL RESULT CALLED TO, READ BACK BY AND VERIFIED  WITH: PHARMD G ABBOTT 06/18/2023 @ 0042 BY AB    Streptococcus agalactiae NOT DETECTED NOT DETECTED Final   Streptococcus pneumoniae NOT DETECTED NOT DETECTED Final   Streptococcus pyogenes DETECTED (A) NOT DETECTED Final    Comment: CRITICAL RESULT CALLED TO, READ BACK BY AND VERIFIED WITH: PHARMD G ABBOTT 06/18/2023 @ 0042 BY AB    A.calcoaceticus-baumannii NOT DETECTED NOT DETECTED Final   Bacteroides fragilis NOT DETECTED NOT DETECTED Final   Enterobacterales NOT DETECTED NOT DETECTED Final   Enterobacter cloacae complex NOT DETECTED NOT DETECTED Final   Escherichia coli NOT DETECTED NOT DETECTED Final   Klebsiella aerogenes NOT DETECTED NOT DETECTED Final   Klebsiella oxytoca NOT DETECTED NOT DETECTED Final   Klebsiella pneumoniae NOT DETECTED NOT DETECTED Final   Proteus species NOT DETECTED NOT DETECTED Final   Salmonella species NOT DETECTED NOT DETECTED Final   Serratia marcescens NOT DETECTED NOT DETECTED Final   Haemophilus influenzae NOT DETECTED NOT DETECTED Final   Neisseria meningitidis NOT DETECTED NOT DETECTED Final   Pseudomonas aeruginosa NOT DETECTED NOT DETECTED Final   Stenotrophomonas maltophilia NOT DETECTED NOT DETECTED Final   Candida albicans NOT DETECTED NOT DETECTED Final   Candida auris NOT DETECTED NOT DETECTED Final   Candida glabrata NOT DETECTED NOT DETECTED Final   Candida krusei NOT DETECTED NOT DETECTED Final   Candida parapsilosis NOT DETECTED NOT DETECTED Final   Candida tropicalis NOT DETECTED NOT DETECTED Final   Cryptococcus neoformans/gattii NOT DETECTED NOT DETECTED Final    Comment: Performed at [facility], [address], [place], [place] [address]  Blood Culture (routine x 2)     Status: Abnormal   Collection Time: 06/17/23  4:08 AM   Specimen: BLOOD RIGHT HAND  Result Value Ref Range Status   Specimen Description   Final    BLOOD RIGHT HAND Performed at [facility], [address], [place], [place]  [address]    Special Requests   Final    BOTTLES DRAWN AEROBIC AND ANAEROBIC Blood Culture adequate volume Performed at [facility], [address] [place], [place] [address]    Culture  Setup Time   Final    GRAM POSITIVE COCCI IN PAIRS IN CHAINS AEROBIC BOTTLE ONLY CRITICAL VALUE NOTED.  VALUE IS CONSISTENT WITH PREVIOUSLY REPORTED AND CALLED VALUE.    Culture (A)  Final  GROUP A STREP (S.PYOGENES) ISOLATED SUSCEPTIBILITIES PERFORMED ON PREVIOUS CULTURE WITHIN THE LAST 5 DAYS. Performed at [facility], [address], [place], [place] [address]    Report Status 06/20/2023 FINAL  Final  Culture, blood (Routine X 2) w Reflex to ID Panel     Status: None (Preliminary result)   Collection Time: 06/18/23  6:10 PM   Specimen: BLOOD  Result Value Ref Range Status   Specimen Description BLOOD SITE NOT SPECIFIED  Final   Special Requests   Final    BOTTLES DRAWN AEROBIC AND ANAEROBIC Blood Culture adequate volume   Culture   Final    NO GROWTH 3 DAYS Performed at [facility], [address] [place], [place] [address]    Report Status PENDING  Incomplete  Culture, blood (Routine X 2) w Reflex to ID Panel     Status: None (Preliminary result)   Collection Time: 06/18/23  6:12 PM   Specimen: BLOOD  Result Value Ref Range Status   Specimen Description BLOOD SITE NOT SPECIFIED  Final   Special Requests   Final    BOTTLES DRAWN AEROBIC AND ANAEROBIC Blood Culture results may not be optimal due to an inadequate volume of blood received in culture bottles   Culture   Final    NO GROWTH 3 DAYS Performed at [facility], [address], [place], [place] [address]    Report Status PENDING  Incomplete  Culture, blood (Routine X 2) w Reflex to ID Panel     Status: None (Preliminary result)   Collection Time: 06/19/23  5:56 AM   Specimen: BLOOD  Result Value Ref Range Status   Specimen Description BLOOD BLOOD LEFT HAND  Final   Special Requests    Final    BOTTLES DRAWN AEROBIC AND ANAEROBIC Blood Culture results may not be optimal due to an inadequate volume of blood received in culture bottles   Culture   Final    NO GROWTH 2 DAYS Performed at [facility], [address] [place], [place] [address]    Report Status PENDING  Incomplete  Culture, blood (Routine X 2) w Reflex to ID Panel     Status: None (Preliminary result)   Collection Time: 06/19/23  5:57 AM   Specimen: BLOOD  Result Value Ref Range Status   Specimen Description BLOOD BLOOD LEFT ARM  Final   Special Requests   Final    BOTTLES DRAWN AEROBIC AND ANAEROBIC Blood Culture results may not be optimal due to an inadequate volume of blood received in culture bottles   Culture   Final    NO GROWTH 2 DAYS Performed at [facility], [address], [place], [place] [address]    Report Status PENDING  Incomplete    RADIOLOGY STUDIES/RESULTS: VAS [place] LOWER EXTREMITY ARTERIAL DUPLEX Result Date: 06/20/2023 LOWER EXTREMITY ARTERIAL DUPLEX STUDY Patient Name:  MIKAIL GOOSTREE  Date of Exam:   06/19/2023 Medical Rec #: [id number]           Accession #:    [id number] Date of Birth: 08/02/1934           Patient Gender: M Patient Age:   85 years Exam Location:  [facility] Procedure:      VAS [place] LOWER EXTREMITY ARTERIAL DUPLEX Referring Phys: Emilie Rutter --------------------------------------------------------------------------------  Indications: Ulceration. Cellulitis. High Risk Factors: Hypertension, hyperlipidemia. Other Factors: Atrial fibrillation.  Vascular Interventions: Abdominal aortogram with LE peripheral  intravascular lithotripsy and balloon angioplasty of the                         SFA-Pop                          05/17/23. Current ABI:            R:patent DP and AT TBI:0.62 L:0.98 TBI 0.35 Limitations: Edema and pain with touch secondary to infection Comparison Study: Prior right LE arterial duplex done 04/26/23 Performing  Technologist: Sherren Kerns RVS  Examination Guidelines: A complete evaluation includes B-mode imaging, spectral Doppler, color Doppler, and power Doppler as needed of all accessible portions of each vessel. Bilateral testing is considered an integral part of a complete examination. Limited examinations for reoccurring indications may be performed as noted.  +----------+--------+-----+---------------+-----------+--------+ RIGHT     PSV cm/sRatioStenosis       Waveform   Comments +----------+--------+-----+---------------+-----------+--------+ CFA Distal65                          multiphasic         +----------+--------+-----+---------------+-----------+--------+ DFA       58                          multiphasic         +----------+--------+-----+---------------+-----------+--------+ SFA Prox  124                         biphasic            +----------+--------+-----+---------------+-----------+--------+ SFA Mid   264          30-49% stenosisbiphasic            +----------+--------+-----+---------------+-----------+--------+ SFA Distal235          30-49% stenosisbiphasic            +----------+--------+-----+---------------+-----------+--------+ POP Prox  80                          monophasic          +----------+--------+-----+---------------+-----------+--------+ POP Mid   66                          monophasic          +----------+--------+-----+---------------+-----------+--------+ POP Distal60                          monophasic          +----------+--------+-----+---------------+-----------+--------+ TP Trunk  116                         monophasic          +----------+--------+-----+---------------+-----------+--------+ ATA Prox  129                         monophasic          +----------+--------+-----+---------------+-----------+--------+ ATA Mid   133                         monophasic           +----------+--------+-----+---------------+-----------+--------+ ATA Distal105  monophasic          +----------+--------+-----+---------------+-----------+--------+ PTA Prox               occluded                           +----------+--------+-----+---------------+-----------+--------+ PTA Mid                occluded                           +----------+--------+-----+---------------+-----------+--------+ PTA Distal             occluded                           +----------+--------+-----+---------------+-----------+--------+  Summary: Right: 30-49% stenosis noted in the mid and distal superficial femoral artery. PTA remains occluded.  See table(s) above for measurements and observations. Electronically signed by Coral Else MD on 06/20/2023 at 10:45:17 AM.    Final    DG Tibia/Fibula Right Result Date: 06/20/2023 CLINICAL DATA:  Lower extremity cellulitis. EXAM: RIGHT TIBIA AND FIBULA - 2 VIEW COMPARISON:  None Available. FINDINGS: No evidence for an acute fracture. No bony destruction in the tibia or fibula to suggest osteomyelitis. Mass degenerative changes are seen at the knee. No soft tissue gas evident in the leg. IMPRESSION: 1. No findings to suggest osteomyelitis in the tibia or fibula 2. Degenerative changes at the knee. Electronically Signed   By: Kennith Center M.D.   On: 06/20/2023 08:03   DG Knee Complete 4 Views Left Result Date: 06/20/2023 CLINICAL DATA:  Recurrent left knee pain. EXAM: LEFT KNEE - COMPLETE 4+ VIEW COMPARISON:  None Available. FINDINGS: No evidence of fracture, dislocation, or joint effusion. Minimal spurring is seen involving medial and lateral joint spaces. Chondrocalcinosis is also noted most consistent with early degenerative joint disease. Vascular calcifications are noted. IMPRESSION: Probable mild degenerative joint disease. No acute abnormality seen. Electronically Signed   By: Lupita Raider M.D.   On: 06/20/2023 08:03    VAS [place] ABI WITH/WO TBI Result Date: 06/19/2023  LOWER EXTREMITY DOPPLER STUDY Patient Name:  Osher Oettinger  Date of Exam:   06/19/2023 Medical Rec #: [id number]           Accession #:    [id number] Date of Birth: 24-Jun-1934           Patient Gender: M Patient Age:   35 years Exam Location:  [facility] Procedure:      VAS [place] ABI WITH/WO TBI Referring Phys: Emilie Rutter --------------------------------------------------------------------------------  Indications: Ulceration. Cellulitis High Risk Factors: Hypertension, hyperlipidemia. Other Factors: Atrial fibrillation.  Vascular Interventions: Abdominal aortogram with LE peripheral intravascular                         lithotripsy and balloon angioplasty of the SFA-Pop                         05/17/23. Limitations: Today's exam was limited due to an open wound. Comparison Study: Prior ABI done 04/26/23 Performing Technologist: Sherren Kerns RVS  Examination Guidelines: A complete evaluation includes at minimum, Doppler waveform signals and systolic blood pressure reading at the level of bilateral brachial, anterior tibial, and posterior tibial arteries, when vessel segments are accessible. Bilateral testing is considered an integral part of a  complete examination. Photoelectric Plethysmograph (PPG) waveforms and toe systolic pressure readings are included as required and additional duplex testing as needed. Limited examinations for reoccurring indications may be performed as noted.  ABI Findings: +---------+------------------+-----+---------+--------+ Right    Rt Pressure (mmHg)IndexWaveform Comment  +---------+------------------+-----+---------+--------+ Brachial 110                    triphasic         +---------+------------------+-----+---------+--------+ PTA                             absent            +---------+------------------+-----+---------+--------+ DP                              biphasic           +---------+------------------+-----+---------+--------+ Great Toe72                0.62                   +---------+------------------+-----+---------+--------+ +---------+------------------+-----+---------+-------+ Left     Lt Pressure (mmHg)IndexWaveform Comment +---------+------------------+-----+---------+-------+ Brachial 117                    triphasic        +---------+------------------+-----+---------+-------+ PTA      115               0.98 biphasic         +---------+------------------+-----+---------+-------+ DP       113               0.97 biphasic         +---------+------------------+-----+---------+-------+ Great Toe41                0.35                  +---------+------------------+-----+---------+-------+ +-------+----------------+-----------+------------------+------------+ ABI/TBIToday's ABI     Today's TBIPrevious ABI      Previous TBI +-------+----------------+-----------+------------------+------------+ Right  AT and DP patent0.62       0.73 (occluded PT)0.45         +-------+----------------+-----------+------------------+------------+ Left   0.98            0.35       0.85              0.70         +-------+----------------+-----------+------------------+------------+ Bilateral TBIs appear decreased compared to prior study on 04/26/23. Left ABIs appear increased compared to prior study on 04/26/23.  Summary: Right: The right toe-brachial index is abnormal. Left: Resting left ankle-brachial index is within normal range. The left toe-brachial index is abnormal. *See table(s) above for measurements and observations.  Electronically signed by Coral Else MD on 06/19/2023 at 2:11:36 PM.    Final    VAS [place] LOWER EXTREMITY VENOUS (DVT) Result Date: 06/19/2023  Lower Venous DVT Study Patient Name:  NATTHEW MARLATT  Date of Exam:   06/19/2023 Medical Rec #: [id number]           Accession #:    [id number] Date of Birth: 1935-01-19            Patient Gender: M Patient Age:   41 years Exam Location:  [facility] Procedure:      VAS [place] LOWER EXTREMITY VENOUS (DVT) Referring Phys: Odette Fraction --------------------------------------------------------------------------------  Indications: Pain, Swelling, Erythema, and Cellulitis. Open  wound on achilles heel.  Limitations: Edema, pain with compression maneuvers/touch. Comparison Study: No prior study on file Performing Technologist: Sherren Kerns RVS  Examination Guidelines: A complete evaluation includes B-mode imaging, spectral Doppler, color Doppler, and power Doppler as needed of all accessible portions of each vessel. Bilateral testing is considered an integral part of a complete examination. Limited examinations for reoccurring indications may be performed as noted. The reflux portion of the exam is performed with the patient in reverse Trendelenburg.  +---------+---------------+---------+-----------+---------------+--------------+ RIGHT    CompressibilityPhasicitySpontaneityProperties     Thrombus Aging +---------+---------------+---------+-----------+---------------+--------------+ CFV      Full           Yes      No         pulsatile                                                                 waveforms                     +---------+---------------+---------+-----------+---------------+--------------+ SFJ      Full                                                             +---------+---------------+---------+-----------+---------------+--------------+ FV Prox                 Yes      No         pulsatile                                                                 waveforms                     +---------+---------------+---------+-----------+---------------+--------------+ FV Mid                  Yes      No         pulsatile                                                                 waveforms                      +---------+---------------+---------+-----------+---------------+--------------+ FV Distal               Yes      No         pulsatile  waveforms                     +---------+---------------+---------+-----------+---------------+--------------+ PFV                     Yes      No         pulsatile                                                                 waveforms                     +---------+---------------+---------+-----------+---------------+--------------+ POP                     Yes      No         pulsatile                                                                 waveforms                     +---------+---------------+---------+-----------+---------------+--------------+ PTV                                                        Not well                                                                  visualized     +---------+---------------+---------+-----------+---------------+--------------+ PERO                                                       Not well                                                                  visualized     +---------+---------------+---------+-----------+---------------+--------------+ Gastroc                 Yes      No                                       +---------+---------------+---------+-----------+---------------+--------------+   +----+---------------+---------+-----------+----------+--------------+ LEFTCompressibilityPhasicitySpontaneityPropertiesThrombus Aging +----+---------------+---------+-----------+----------+--------------+ CFV Full  Yes      Yes                                 +----+---------------+---------+-----------+----------+--------------+     Summary: RIGHT: - There is no evidence of deep vein thrombosis in the lower extremity. However, portions of this examination were  limited- see technologist comments above.  - No cystic structure found in the popliteal fossa. pulsatile waveforms noted  LEFT: - No evidence of common femoral vein obstruction.   *See table(s) above for measurements and observations. Electronically signed by Coral Else MD on 06/19/2023 at 2:05:25 PM.    Final      LOS: 4 days   Signature  -    Susa Raring M.D on 06/21/2023 at 9:25 AM   -  To page go to [email or web address]

## 2023-06-21 NOTE — Progress Notes (Signed)
 Occupational Therapy Treatment Patient Details Name: Logan French MRN: [id number] DOB: 25-Sep-1934 Today's Date: 06/21/2023   History of present illness Pt is an 89 y/o male admitted 4/3 for RLE cellulitis and nonhealing wound. +for bacetermia. PMH: prostate cancer, anemia, HTN, HLD, a fib, b12 deficiency, PAD s/p vascular intervention.   OT comments  Pt noted with significant functional decline from initial OT eval w/ worsening RLE pain with activity, as well as new LLE pain. Pt requiring +2 assist for bed mobility and brief standing at bedside with RW, unable to take steps today despite IV pain meds given at start of session. Pt requiring extensive assist for LB ADLs. Continue to recommend intensive rehab services as pt is below functional baseline and with good rehab potential. Provided additional info on potential rehab venues and concerns if pt discharged directly home given current functional abilities.       If plan is discharge home, recommend the following:  A lot of help with walking and/or transfers;A lot of help with bathing/dressing/bathroom;Assistance with cooking/housework;Help with stairs or ramp for entrance;Assist for transportation;Two people to help with walking and/or transfers   Equipment Recommendations  Other (comment) (TBD pending progress)    Recommendations for Other Services Rehab consult    Precautions / Restrictions Precautions Precautions: Fall Restrictions Weight Bearing Restrictions Per Provider Order: No       Mobility Bed Mobility Overal bed mobility: Needs Assistance Bed Mobility: Supine to Sit, Sit to Supine     Supine to sit: Mod assist, +2 for physical assistance, +2 for safety/equipment, HOB elevated, Used rails Sit to supine: Max assist, +2 for safety/equipment, +2 for physical assistance        Transfers Overall transfer level: Needs assistance Equipment used: Rolling walker (2 wheels) Transfers: Sit to/from Stand Sit to Stand:  Mod assist, +2 physical assistance, +2 safety/equipment           General transfer comment: First standing attempt unsuccessful. On second attempt, Mod A x 2 to stand from elevated bed w/ pt pushing up with LUE. increased time and difficulty achieving full upright posture with RW     Balance Overall balance assessment: Needs assistance Sitting-balance support: No upper extremity supported, Feet supported Sitting balance-Leahy Scale: Fair     Standing balance support: Bilateral upper extremity supported, During functional activity Standing balance-Leahy Scale: Poor                             ADL either performed or assessed with clinical judgement   ADL Overall ADL's : Needs assistance/impaired                     Lower Body Dressing: Total assistance;Bed level                 General ADL Comments: Emphasis on OOB attempts after noted functional decline over weekend due to pain in BLE    Extremity/Trunk Assessment Upper Extremity Assessment Upper Extremity Assessment: Overall WFL for tasks assessed;Right hand dominant   Lower Extremity Assessment Lower Extremity Assessment: Defer to PT evaluation        Vision   Vision Assessment?: No apparent visual deficits   Perception     Praxis     Communication Communication Communication: No apparent difficulties   Cognition Arousal: Alert Behavior During Therapy: WFL for tasks assessed/performed Cognition: Cognition impaired         Attention impairment (select first level  of impairment): Sustained attention Executive functioning impairment (select all impairments): Sequencing, Reasoning, Problem solving OT - Cognition Comments: does have some decreased insight into current deficits and why rehab recommended (provided detail info today). likely also impacted by IV pain meds                 Following commands: Impaired Following commands impaired: Follows one step commands with  increased time, Follows multi-step commands inconsistently      Cueing   Cueing Techniques: Verbal cues, Gestural cues, Tactile cues  Exercises      Shoulder Instructions       General Comments Noted clear secretions on floor after standing and assisted back to bed, also noted on pad. RN aware. Pt also with some blood on scrotum, potential scratch - RN awre    Pertinent Vitals/ Pain       Pain Assessment Pain Assessment: Faces Faces Pain Scale: Hurts whole lot Pain Location: RLE and also LLE (primarily ankle) Pain Descriptors / Indicators: Grimacing, Guarding, Aching, Sore Pain Intervention(s): Monitored during session, Limited activity within patient's tolerance, RN gave pain meds during session, Repositioned  Home Living                                          Prior Functioning/Environment              Frequency  Min 2X/week        Progress Toward Goals  OT Goals(current goals can now be found in the care plan section)  Progress towards OT goals: Progressing toward goals  Acute Rehab OT Goals Patient Stated Goal: pain control OT Goal Formulation: With patient Time For Goal Achievement: 07/02/23 Potential to Achieve Goals: Good ADL Goals Pt Will Perform Lower Body Bathing: with modified independence;sit to/from stand;sitting/lateral leans Pt Will Perform Lower Body Dressing: with modified independence;sit to/from stand;sitting/lateral leans Pt Will Transfer to Toilet: with modified independence;ambulating  Plan      Co-evaluation                 AM-PAC OT "6 Clicks" Daily Activity     Outcome Measure   Help from another person eating meals?: None Help from another person taking care of personal grooming?: A Little Help from another person toileting, which includes using toliet, bedpan, or urinal?: A Lot Help from another person bathing (including washing, rinsing, drying)?: A Lot Help from another person to put on and taking  off regular upper body clothing?: A Lot Help from another person to put on and taking off regular lower body clothing?: Total 6 Click Score: 14    End of Session Equipment Utilized During Treatment: Gait belt;Rolling walker (2 wheels)  OT Visit Diagnosis: Other abnormalities of gait and mobility (R26.89);Unsteadiness on feet (R26.81)   Activity Tolerance Patient limited by pain   Patient Left in bed;with call bell/phone within reach;with bed alarm set   Nurse Communication Mobility status        Time: 1610-9604 OT Time Calculation (min): 41 min  Charges: OT General Charges $OT Visit: 1 Visit OT Treatments $Self Care/Home Management : 8-22 mins $Therapeutic Activity: 23-37 mins  Bradd Canary, OTR/L Acute Rehab Services Office: [phone]   Lorre Munroe 06/21/2023, 12:50 PM

## 2023-06-21 NOTE — Care Management Important Message (Signed)
 Important Message  Patient Details  Name: Logan French MRN: [id number] Date of Birth: 1934/09/15   Important Message Given:  Yes - Medicare IM     Dorena Bodo 06/21/2023, 3:31 PM

## 2023-06-21 NOTE — Progress Notes (Signed)
  Progress Note    06/21/2023 8:08 AM * No surgery found *  Subjective:  complaining of knee pain   Vitals:   06/21/23 0400 06/21/23 0500  BP: 110/63   Pulse: 74 72  Resp: (!) 21 19  Temp: 98 F (36.7 C)   SpO2: 94% 91%   Physical Exam: Cardiac:  regular Lungs:  non labored Extremities:  right leg dressed. RLE edematous. Palpable DP Neurologic: alert and oriented  CBC    Component Value Date/Time   WBC 15.9 (H) 06/21/2023 0431   RBC 4.01 (L) 06/21/2023 0431   HGB 11.5 (L) 06/21/2023 0431   HGB 13.4 09/08/2021 1027   HCT 34.4 (L) 06/21/2023 0431   HCT 41.2 09/08/2021 1027   PLT 230 06/21/2023 0431   PLT 270 09/08/2021 1027   MCV 85.8 06/21/2023 0431   MCV 85 09/08/2021 1027   MCH 28.7 06/21/2023 0431   MCHC 33.4 06/21/2023 0431   RDW 15.2 06/21/2023 0431   RDW 14.1 09/08/2021 1027   LYMPHSABS 1.2 06/21/2023 0431   MONOABS 1.3 (H) 06/21/2023 0431   EOSABS 0.0 06/21/2023 0431   BASOSABS 0.0 06/21/2023 0431    BMET    Component Value Date/Time   NA 127 (L) 06/21/2023 0431   NA 142 09/08/2021 1027   K 3.9 06/21/2023 0431   CL 93 (L) 06/21/2023 0431   CO2 22 06/21/2023 0431   GLUCOSE 129 (H) 06/21/2023 0431   BUN 23 06/21/2023 0431   BUN 23 09/08/2021 1027   CREATININE 1.07 06/21/2023 0431   CALCIUM 7.8 (L) 06/21/2023 0431   GFRNONAA >60 06/21/2023 0431   GFRAA 67 07/14/2019 1020    INR    Component Value Date/Time   INR 1.6 (H) 06/18/2023 0449     Intake/Output Summary (Last 24 hours) at 06/21/2023 1610 Last data filed at 06/20/2023 1900 Gross per 24 hour  Intake 800.4 ml  Output --  Net 800.4 ml     Assessment/Plan:  89 y.o. male with right achilles wound s/p revascularization   RLE remains patent with palpable Dp Continue wound care per Dr. Audrie Lia instructions Right leg remains edematous. Continue elevation of RLE and compression Remains on Linezolid and Pen G Continue Plavix and Statin Vascular will follow peripherally  Graceann Congress, PA-C [facility] [phone] 06/21/2023 8:08 AM

## 2023-06-21 NOTE — Progress Notes (Signed)
 [facility]  Date of Admission:  06/17/2023     Reason for Follow Up: Cellulitis of right lower extremity  Total days of antibiotics 6         ASSESSMENT:  Logan French blood cultures from 06/18/2023 and 06/19/2023 remain without growth to date in the setting of group A streptococcal bacteremia and cellulitis of the right lower extremity.  TTE negative for endocarditis and lower extremity Dopplers negative for deep vein thrombosis.  No surgical interventions per orthopedics with wound care recommendations provided.  Continue to monitor cultures for clearance of bacteremia.  Discontinue linezolid.  Discussed plan of care to continue current dose of penicillin.  Continue with leg elevation and compression.  Will monitor right thigh and new onset left knee pain with no evidence of effusion currently.  Continue universal/standard precautions.  Remaining medical and supportive care per internal medicine.  PLAN:  Continue current dose of penicillin G. Discontinue linezolid. Continue wound care per orthopedics. Elevate leg and continue compression wrap. Monitor cultures for clearance of bacteremia. Remaining medical and supportive care per internal medicine.  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)    clopidogrel  75 mg Oral Daily   colchicine  0.6 mg Oral Daily   docusate sodium  100 mg Oral BID   ezetimibe  10 mg Oral Daily   finasteride  5 mg Oral Daily   insulin aspart  0-9 Units Subcutaneous TID WC   metoprolol succinate  25 mg Oral Q12H   mupirocin ointment   Topical Daily   nutrition supplement (JUVEN)  1 packet Oral BID BM   pantoprazole  40 mg Oral Q1200   polyethylene glycol  17 g Oral Daily   rosuvastatin  20 mg Oral Daily   LIBREXIA-AF apixaban or placebo  5 mg Oral BID   LIBREXIA-AF [id number]  (Milvexian) or placebo  1 tablet Oral BID    SUBJECTIVE:  Afebrile overnight with no acute events.  Continues to have right lower extremity pain.  Slight worsening of right inner thigh with redness and some swelling.  Tolerating antibiotics with no adverse side effects.  Denies fevers, chills, or sweats.  Wife at bedside.  No Known Allergies   Review of Systems: Review of Systems  Constitutional:  Negative for chills, fever and weight loss.  Respiratory:  Negative for cough, shortness of breath and wheezing.   Cardiovascular:  Positive for leg swelling (and right leg pain). Negative for chest pain.  Gastrointestinal:  Negative for abdominal pain, constipation, diarrhea, nausea and vomiting.  Skin:  Negative for rash.      OBJECTIVE: Vitals:   06/21/23 0500 06/21/23 0800 06/21/23 1157 06/21/23 1241  BP:  113/79 117/85 (!) 140/61  Pulse: 72 74 75   Resp: 19 18 20 20   Temp:  98.1 F (36.7 C) 97.9 F (36.6 C)   TempSrc:  Oral Oral   SpO2: 91% 91%    Weight:      Height:       Body mass index is 31.51 kg/m.  Physical Exam Constitutional:      General: Logan French is not in acute distress.    Appearance: Logan French is well-developed.  Cardiovascular:     Rate and Rhythm: Normal rate and regular rhythm.     Heart sounds: Normal heart sounds.  Pulmonary:     Effort: Pulmonary  effort is normal.     Breath sounds: Normal breath sounds.  Musculoskeletal:     Comments: Left knee with no obvious deformity, discoloration, or edema.  Mild tenderness on the medial aspect.  No clear effusions.  Skin:    General: Skin is warm and dry.     Comments: Right leg elevated and with ace wrap for compression. Right inner thigh slightly red without tenderness.   Neurological:     Mental Status: Logan French is alert and oriented to person, place, and time.  Psychiatric:        Mood and Affect: Mood normal.     Lab Results Lab Results  Component Value Date   WBC 15.9 (H) 06/21/2023   HGB 11.5 (L) 06/21/2023    HCT 34.4 (L) 06/21/2023   MCV 85.8 06/21/2023   PLT 230 06/21/2023    Lab Results  Component Value Date   CREATININE 1.07 06/21/2023   BUN 23 06/21/2023   NA 127 (L) 06/21/2023   K 3.9 06/21/2023   CL 93 (L) 06/21/2023   CO2 22 06/21/2023    Lab Results  Component Value Date   ALT 19 06/18/2023   AST 34 06/18/2023   ALKPHOS 56 06/18/2023   BILITOT 1.5 (H) 06/18/2023     Microbiology: Recent Results (from the past 240 hours)  Blood Culture (routine x 2)     Status: Abnormal   Collection Time: 06/17/23  4:03 AM   Specimen: BLOOD RIGHT ARM  Result Value Ref Range Status   Specimen Description   Final    BLOOD RIGHT ARM Performed at [facility], [address] [place], [place] [address]    Special Requests   Final    BOTTLES DRAWN AEROBIC AND ANAEROBIC Blood Culture adequate volume Performed at [facility], [address] [place], [place] [address]    Culture  Setup Time   Final    GRAM POSITIVE COCCI IN CHAINS ANAEROBIC BOTTLE ONLY CRITICAL RESULT CALLED TO, READ BACK BY AND VERIFIED WITH: PHARMD G ABBOTT 06/18/2023 @ 0042 BY AB    Culture (A)  Final    GROUP A STREP (S.PYOGENES) ISOLATED HEALTH DEPARTMENT NOTIFIED Performed at [facility], [address] [place], [place] [address]    Report Status 06/20/2023 FINAL  Final   Organism ID, Bacteria GROUP A STREP (S.PYOGENES) ISOLATED  Final      Susceptibility   Group a strep (s.pyogenes) isolated - MIC*    PENICILLIN <=0.06 SENSITIVE Sensitive     CEFTRIAXONE <=0.12 SENSITIVE Sensitive     ERYTHROMYCIN 2 RESISTANT Resistant     LEVOFLOXACIN 0.5 SENSITIVE Sensitive     VANCOMYCIN 0.5 SENSITIVE Sensitive     * GROUP A STREP (S.PYOGENES) ISOLATED  Blood Culture ID Panel (Reflexed)     Status: Abnormal   Collection Time: 06/17/23  4:03 AM  Result Value Ref Range Status   Enterococcus faecalis NOT DETECTED NOT DETECTED Final   Enterococcus Faecium NOT DETECTED NOT  DETECTED Final   Listeria monocytogenes NOT DETECTED NOT DETECTED Final   Staphylococcus species NOT DETECTED NOT DETECTED Final   Staphylococcus aureus (BCID) NOT DETECTED NOT DETECTED Final   Staphylococcus epidermidis NOT DETECTED NOT DETECTED Final   Staphylococcus lugdunensis NOT DETECTED NOT DETECTED Final   Streptococcus species DETECTED (A) NOT DETECTED Final    Comment: CRITICAL RESULT CALLED TO, READ BACK BY AND VERIFIED WITH: PHARMD G ABBOTT 06/18/2023 @ 0042 BY AB    Streptococcus agalactiae NOT  DETECTED NOT DETECTED Final   Streptococcus pneumoniae NOT DETECTED NOT DETECTED Final   Streptococcus pyogenes DETECTED (A) NOT DETECTED Final    Comment: CRITICAL RESULT CALLED TO, READ BACK BY AND VERIFIED WITH: PHARMD G ABBOTT 06/18/2023 @ 0042 BY AB    A.calcoaceticus-baumannii NOT DETECTED NOT DETECTED Final   Bacteroides fragilis NOT DETECTED NOT DETECTED Final   Enterobacterales NOT DETECTED NOT DETECTED Final   Enterobacter cloacae complex NOT DETECTED NOT DETECTED Final   Escherichia coli NOT DETECTED NOT DETECTED Final   Klebsiella aerogenes NOT DETECTED NOT DETECTED Final   Klebsiella oxytoca NOT DETECTED NOT DETECTED Final   Klebsiella pneumoniae NOT DETECTED NOT DETECTED Final   Proteus species NOT DETECTED NOT DETECTED Final   Salmonella species NOT DETECTED NOT DETECTED Final   Serratia marcescens NOT DETECTED NOT DETECTED Final   Haemophilus influenzae NOT DETECTED NOT DETECTED Final   Neisseria meningitidis NOT DETECTED NOT DETECTED Final   Pseudomonas aeruginosa NOT DETECTED NOT DETECTED Final   Stenotrophomonas maltophilia NOT DETECTED NOT DETECTED Final   Candida albicans NOT DETECTED NOT DETECTED Final   Candida auris NOT DETECTED NOT DETECTED Final   Candida glabrata NOT DETECTED NOT DETECTED Final   Candida krusei NOT DETECTED NOT DETECTED Final   Candida parapsilosis NOT DETECTED NOT DETECTED Final   Candida tropicalis NOT DETECTED NOT DETECTED Final    Cryptococcus neoformans/gattii NOT DETECTED NOT DETECTED Final    Comment: Performed at [facility], [address] [place], [place] [address]  Blood Culture (routine x 2)     Status: Abnormal   Collection Time: 06/17/23  4:08 AM   Specimen: BLOOD RIGHT HAND  Result Value Ref Range Status   Specimen Description   Final    BLOOD RIGHT HAND Performed at [facility], [address] [place], [place] [address]    Special Requests   Final    BOTTLES DRAWN AEROBIC AND ANAEROBIC Blood Culture adequate volume Performed at [facility], [address] [place], [place] [address]    Culture  Setup Time   Final    GRAM POSITIVE COCCI IN PAIRS IN CHAINS AEROBIC BOTTLE ONLY CRITICAL VALUE NOTED.  VALUE IS CONSISTENT WITH PREVIOUSLY REPORTED AND CALLED VALUE.    Culture (A)  Final    GROUP A STREP (S.PYOGENES) ISOLATED SUSCEPTIBILITIES PERFORMED ON PREVIOUS CULTURE WITHIN THE LAST 5 DAYS. Performed at [facility], [address] [place], [place] [address]    Report Status 06/20/2023 FINAL  Final  Culture, blood (Routine X 2) w Reflex to ID Panel     Status: None (Preliminary result)   Collection Time: 06/18/23  6:10 PM   Specimen: BLOOD  Result Value Ref Range Status   Specimen Description BLOOD SITE NOT SPECIFIED  Final   Special Requests   Final    BOTTLES DRAWN AEROBIC AND ANAEROBIC Blood Culture adequate volume   Culture   Final    NO GROWTH 3 DAYS Performed at [facility], [address] [place], [place] [address]    Report Status PENDING  Incomplete  Culture, blood (Routine X 2) w Reflex to ID Panel     Status: None (Preliminary result)   Collection Time: 06/18/23  6:12 PM   Specimen: BLOOD  Result Value Ref Range Status   Specimen Description BLOOD SITE NOT SPECIFIED  Final   Special Requests   Final    BOTTLES DRAWN AEROBIC AND ANAEROBIC Blood Culture results may not be  optimal due to an inadequate volume of blood  received in culture bottles   Culture   Final    NO GROWTH 3 DAYS Performed at [facility], [address] [place], [place] [address]    Report Status PENDING  Incomplete  Culture, blood (Routine X 2) w Reflex to ID Panel     Status: None (Preliminary result)   Collection Time: 06/19/23  5:56 AM   Specimen: BLOOD  Result Value Ref Range Status   Specimen Description BLOOD BLOOD LEFT HAND  Final   Special Requests   Final    BOTTLES DRAWN AEROBIC AND ANAEROBIC Blood Culture results may not be optimal due to an inadequate volume of blood received in culture bottles   Culture   Final    NO GROWTH 2 DAYS Performed at [facility], [address], [place], [place] [address]    Report Status PENDING  Incomplete  Culture, blood (Routine X 2) w Reflex to ID Panel     Status: None (Preliminary result)   Collection Time: 06/19/23  5:57 AM   Specimen: BLOOD  Result Value Ref Range Status   Specimen Description BLOOD BLOOD LEFT ARM  Final   Special Requests   Final    BOTTLES DRAWN AEROBIC AND ANAEROBIC Blood Culture results may not be optimal due to an inadequate volume of blood received in culture bottles   Culture   Final    NO GROWTH 2 DAYS Performed at [facility], [address] [place] [address] [place], [place] [address]    Report Status PENDING  Incomplete    I have personally spent 30  minutes involved in face-to-face and non-face-to-face activities for this patient on the day of the visit. Professional time spent includes the following activities: Preparing to see the patient (review of tests), Obtaining and/or reviewing separately obtained history (admission/discharge record), Performing a medically appropriate examination and/or evaluation , Ordering medications/tests/procedures, referring and communicating with other health care professionals, Documenting clinical information in the EMR, Independently interpreting results (not separately reported), Communicating results to the  patient/family/caregiver, Counseling and educating the patient/family/caregiver and Care coordination (not separately reported).    Marcos Eke, NP [facility]  06/21/2023  12:44 PM

## 2023-06-21 NOTE — Plan of Care (Signed)

## 2023-06-22 ENCOUNTER — Inpatient Hospital Stay (HOSPITAL_COMMUNITY)

## 2023-06-22 ENCOUNTER — Ambulatory Visit (HOSPITAL_BASED_OUTPATIENT_CLINIC_OR_DEPARTMENT_OTHER): Admitting: General Surgery

## 2023-06-22 DIAGNOSIS — L03115 Cellulitis of right lower limb: Secondary | ICD-10-CM | POA: Diagnosis not present

## 2023-06-22 DIAGNOSIS — R7881 Bacteremia: Secondary | ICD-10-CM | POA: Diagnosis not present

## 2023-06-22 DIAGNOSIS — B950 Streptococcus, group A, as the cause of diseases classified elsewhere: Secondary | ICD-10-CM | POA: Diagnosis not present

## 2023-06-22 DIAGNOSIS — L97311 Non-pressure chronic ulcer of right ankle limited to breakdown of skin: Secondary | ICD-10-CM | POA: Diagnosis not present

## 2023-06-22 LAB — CBC WITH DIFFERENTIAL/PLATELET
Abs Immature Granulocytes: 0.27 10*3/uL — ABNORMAL HIGH (ref 0.00–0.07)
Basophils Absolute: 0 10*3/uL (ref 0.0–0.1)
Basophils Relative: 0 %
Eosinophils Absolute: 0.1 10*3/uL (ref 0.0–0.5)
Eosinophils Relative: 1 %
HCT: 34.1 % — ABNORMAL LOW (ref 39.0–52.0)
Hemoglobin: 11.5 g/dL — ABNORMAL LOW (ref 13.0–17.0)
Immature Granulocytes: 2 %
Lymphocytes Relative: 10 %
Lymphs Abs: 1.1 10*3/uL (ref 0.7–4.0)
MCH: 28.5 pg (ref 26.0–34.0)
MCHC: 33.7 g/dL (ref 30.0–36.0)
MCV: 84.6 fL (ref 80.0–100.0)
Monocytes Absolute: 0.8 10*3/uL (ref 0.1–1.0)
Monocytes Relative: 6 %
Neutro Abs: 9.8 10*3/uL — ABNORMAL HIGH (ref 1.7–7.7)
Neutrophils Relative %: 81 %
Platelets: 260 10*3/uL (ref 150–400)
RBC: 4.03 MIL/uL — ABNORMAL LOW (ref 4.22–5.81)
RDW: 15.1 % (ref 11.5–15.5)
WBC: 12 10*3/uL — ABNORMAL HIGH (ref 4.0–10.5)
nRBC: 0 % (ref 0.0–0.2)

## 2023-06-22 LAB — BASIC METABOLIC PANEL WITH GFR
Anion gap: 13 (ref 5–15)
BUN: 23 mg/dL (ref 8–23)
CO2: 22 mmol/L (ref 22–32)
Calcium: 7.9 mg/dL — ABNORMAL LOW (ref 8.9–10.3)
Chloride: 90 mmol/L — ABNORMAL LOW (ref 98–111)
Creatinine, Ser: 1.01 mg/dL (ref 0.61–1.24)
GFR, Estimated: >60 mL/min (ref >60)
Glucose, Bld: 130 mg/dL — ABNORMAL HIGH (ref 70–99)
Potassium: 3.8 mmol/L (ref 3.5–5.1)
Sodium: 125 mmol/L — ABNORMAL LOW (ref 135–145)

## 2023-06-22 LAB — GLUCOSE, CAPILLARY
Glucose-Capillary: 116 mg/dL — ABNORMAL HIGH (ref 70–99)
Glucose-Capillary: 150 mg/dL — ABNORMAL HIGH (ref 70–99)
Glucose-Capillary: 129 mg/dL — ABNORMAL HIGH (ref 70–99)
Glucose-Capillary: 150 mg/dL — ABNORMAL HIGH (ref 70–99)

## 2023-06-22 LAB — BRAIN NATRIURETIC PEPTIDE: B Natriuretic Peptide: 469.2 pg/mL — ABNORMAL HIGH (ref 0.0–100.0)

## 2023-06-22 LAB — CREATININE, URINE, RANDOM: Creatinine, Urine: 96 mg/dL

## 2023-06-22 LAB — OSMOLALITY, URINE: Osmolality, Ur: 640 mosm/kg (ref 300–900)

## 2023-06-22 LAB — OSMOLALITY: Osmolality: 265 mosm/kg — ABNORMAL LOW (ref 275–295)

## 2023-06-22 LAB — SODIUM, URINE, RANDOM: Sodium, Ur: <10 mmol/L

## 2023-06-22 LAB — URIC ACID: Uric Acid, Serum: 5 mg/dL (ref 3.7–8.6)

## 2023-06-22 LAB — PROCALCITONIN: Procalcitonin: 1.09 ng/mL

## 2023-06-22 LAB — MAGNESIUM: Magnesium: 2 mg/dL (ref 1.7–2.4)

## 2023-06-22 LAB — C-REACTIVE PROTEIN: CRP: 27.5 mg/dL — ABNORMAL HIGH (ref <1.0)

## 2023-06-22 MED ORDER — FUROSEMIDE 10 MG/ML IJ SOLN
40.0000 mg | Freq: Once | INTRAMUSCULAR | Status: AC
  Administered 2023-06-22: 40 mg via INTRAVENOUS
  Filled 2023-06-22: qty 4

## 2023-06-22 MED ORDER — OXYCODONE HCL 5 MG PO TABS
5.0000 mg | ORAL_TABLET | Freq: Three times a day (TID) | ORAL | Status: DC | PRN
  Administered 2023-06-22 – 2023-06-24 (×5): 5 mg via ORAL
  Filled 2023-06-22 (×6): qty 1

## 2023-06-22 MED ORDER — TRAMADOL HCL 50 MG PO TABS
50.0000 mg | ORAL_TABLET | Freq: Two times a day (BID) | ORAL | Status: DC | PRN
  Administered 2023-06-22 – 2023-06-24 (×3): 50 mg via ORAL
  Filled 2023-06-22 (×3): qty 1

## 2023-06-22 NOTE — TOC Progression Note (Signed)
 Transition of Care [facility]) - Progression Note    Patient Details  Name: Logan French MRN: [id number] Date of Birth: [birth year]  Transition of Care [facility]) CM/SW Contact  Mearl Latin, LCSW Phone Number: 06/22/2023, 3:29 PM  Clinical Narrative:    CSW met with patient to discuss discharge plan. He reported talking with CIR and would like to go with that option for rehab prior to returning home. He is familiar with SNFs in the area. CSW will continue to follow for CIR determination of candidacy.    Expected Discharge Plan:  (TBD) Barriers to Discharge: Continued Medical Work up  Expected Discharge Plan and Services In-house Referral: Clinical Social Work Discharge Planning Services: CM Consult Post Acute Care Choice: IP Rehab Living arrangements for the past 2 months: Single Family Home                                       Social Determinants of Health (SDOH) Interventions SDOH Screenings   Food Insecurity: No Food Insecurity (06/17/2023)  Housing: Low Risk  (06/17/2023)  Transportation Needs: No Transportation Needs (06/17/2023)  Utilities: Not At Risk (06/17/2023)  Social Connections: Socially Integrated (06/17/2023)  Tobacco Use: Low Risk  (06/17/2023)    Readmission Risk Interventions     No data to display

## 2023-06-22 NOTE — Evaluation (Signed)
 Clinical/Bedside Swallow Evaluation Patient Details  Name: Logan French MRN: [id number] Date of Birth: [birth year]  Today's Date: 06/22/2023 Time: SLP Start Time (ACUTE ONLY): 1021 SLP Stop Time (ACUTE ONLY): 1033 SLP Time Calculation (min) (ACUTE ONLY): 12 min  Past Medical History:  Past Medical History:  Diagnosis Date   A-fib (HCC)    B12 deficiency    Diverticulosis    Fatigue    History of colonic polyps    Hyperlipemia    Hypertension    Pernicious anemia    Prostate cancer (HCC)    Syncope    Syncope    Past Surgical History:  Past Surgical History:  Procedure Laterality Date   ABDOMINAL AORTOGRAM W/LOWER EXTREMITY N/A 05/17/2023   Procedure: ABDOMINAL AORTOGRAM W/LOWER EXTREMITY;  Surgeon: [name], MD;  Location: [facility] INVASIVE CV LAB;  Service: Cardiovascular;  Laterality: N/A;   CARDIOVERSION N/A 10/07/2021   Procedure: CARDIOVERSION;  Surgeon: Thomasene Ripple, DO;  Location: [facility] ENDOSCOPY;  Service: Cardiovascular;  Laterality: N/A;   IR ANGIO INTRA EXTRACRAN SEL COM CAROTID INNOMINATE BILAT MOD SED  09/10/2017   IR ANGIO VERTEBRAL SEL SUBCLAVIAN INNOMINATE BILAT MOD SED  09/10/2017   PERIPHERAL INTRAVASCULAR LITHOTRIPSY Right 05/17/2023   Procedure: PERIPHERAL INTRAVASCULAR LITHOTRIPSY;  Surgeon: [name], MD;  Location: [facility] INVASIVE CV LAB;  Service: Cardiovascular;  Laterality: Right;  SFA-POP   PERIPHERAL VASCULAR BALLOON ANGIOPLASTY Right 05/17/2023   Procedure: PERIPHERAL VASCULAR BALLOON ANGIOPLASTY;  Surgeon: [name], MD;  Location: [facility] INVASIVE CV LAB;  Service: Cardiovascular;  Laterality: Right;  SFA-POP   HPI:  Logan, French. is a 89 y.o. male with history of chronic right leg wound-followed by wound care-s/p recent PCI to popliteal artery-presented with severe RLE pain/swelling/erythema following a cat scratch around 3/31.  Patient was found to have sepsis secondary to RLE cellulitis with streptococcal bacteremia and admitted to the  hospitalist service. CXR 4/3: "Low inspiration with linear atelectasis in lung bases. No focal  pneumonia is evident."  CXR 4/8: "Hypoinflation of the lungs with minimal bibasilar subsegmental  atelectasis."  Pt with past medical history of peripheral vascular disease status post angioplasty of the right popliteal artery, essential hypertension, hyperlipidemia, a-fib, BPH, dyslipidemia, B12 deficiency,    Assessment / Plan / Recommendation  Clinical Impression  Pt presents with functional swallowing as assessed clinically.  Pt with throat clearing/grunting on SLP arrival prior to administration of PO trials.  This persisted intermittently throughout today's session and was seemingly unrelated to POs. There were no other clinical s/s of aspiration with any consistencies trialed and pt exhibited good oral clearance of solids.  CXR today reflecting minimal bibasilar subsegmental atelectasis.  RN reports that pt complained of retrosternal stasis with pill administration which pt denies.  Pt c/o increased drainage/phlegm.  Symptoms seem most consistent with reflux.  If it is bothersome, consider further esophageal assessment.  Pt appears safe to continue current diet and has no further ST needs. SLP will sign off.    Recommend regular texture diet with thin liquids.   SLP Visit Diagnosis: Dysphagia, unspecified (R13.10)    Aspiration Risk  No limitations    Diet Recommendation Regular;Thin liquid    Liquid Administration via: Cup;Straw Medication Administration: Whole meds with liquid Supervision: Patient able to self feed Compensations: Slow rate;Small sips/bites Postural Changes: Seated upright at 90 degrees;Remain upright for at least 30 minutes after po intake    Other  Recommendations Recommended Consults: Consider esophageal assessment (?reflux) Oral Care  Recommendations: Oral care BID    Recommendations for follow up therapy are one component of a multi-disciplinary discharge planning  process, led by the attending physician.  Recommendations may be updated based on patient status, additional functional criteria and insurance authorization.  Follow up Recommendations No SLP follow up      Assistance Recommended at Discharge  N/A  Functional Status Assessment Patient has not had a recent decline in their functional status  Frequency and Duration  (N/A)          Prognosis Prognosis for improved oropharyngeal function:  (N/A)      Swallow Study   General Date of Onset: 06/17/23 HPI: Logan, French. is a 89 y.o. male with history of chronic right leg wound-followed by wound care-s/p recent PCI to popliteal artery-presented with severe RLE pain/swelling/erythema following a cat scratch around 3/31.  Patient was found to have sepsis secondary to RLE cellulitis with streptococcal bacteremia and admitted to the hospitalist service. CXR 4/3: "Low inspiration with linear atelectasis in lung bases. No focal  pneumonia is evident."  CXR 4/8: "Hypoinflation of the lungs with minimal bibasilar subsegmental  atelectasis."  Pt with past medical history of peripheral vascular disease status post angioplasty of the right popliteal artery, essential hypertension, hyperlipidemia, a-fib, BPH, dyslipidemia, B12 deficiency, Type of Study: Bedside Swallow Evaluation Previous Swallow Assessment: None Diet Prior to this Study: Regular;Thin liquids (Level 0) Temperature Spikes Noted: No Respiratory Status: Room air History of Recent Intubation: No Behavior/Cognition: Alert;Cooperative;Pleasant mood Oral Cavity Assessment: Within Functional Limits Oral Care Completed by SLP: No Oral Cavity - Dentition: Adequate natural dentition Vision: Functional for self-feeding Self-Feeding Abilities: Able to feed self Patient Positioning: Upright in bed Baseline Vocal Quality: Normal Volitional Cough: Strong Volitional Swallow: Able to elicit    Oral/Motor/Sensory Function Overall Oral  Motor/Sensory Function: Within functional limits   Ice Chips Ice chips: Not tested   Thin Liquid Thin Liquid: Within functional limits Presentation: Straw    Nectar Thick Nectar Thick Liquid: Not tested   Honey Thick Honey Thick Liquid: Not tested   Puree Puree: Within functional limits Presentation: Spoon   Solid     Solid: Within functional limits Presentation: Self Fed      Kerrie Pleasure, MA, CCC-SLP Acute Rehabilitation Services Office: [phone] 06/22/2023,10:56 AM

## 2023-06-22 NOTE — Progress Notes (Signed)
   06/22/23 1633  Mobility  Activity Transferred from chair to bed  Level of Assistance Dependent, patient does less than 25% (+3)  Assistive Device Stedy  Activity Response Tolerated fair  Mobility Referral Yes  Mobility visit 1 Mobility  Mobility Specialist Start Time (ACUTE ONLY) 1615  Mobility Specialist Stop Time (ACUTE ONLY) 1633  Mobility Specialist Time Calculation (min) (ACUTE ONLY) 18 min   Mobility Specialist: Progress Note Pt agreeable to mobility session - received in bed. C/o LLE pain throughout - x3 attempt STS. Needed heavy cues for hand placement on stedy. Returned to bed with all needs met - call bell within reach. Bed alarm on.   Barnie Mort, BS Mobility Specialist Please contact via SecureChat or  Rehab office at [phone].

## 2023-06-22 NOTE — Progress Notes (Addendum)
 PROGRESS NOTE        PATIENT DETAILS Name: Logan French Age: 89 y.o. Sex: male Date of Birth: January 01, 1935 Admit Date: 06/17/2023 Admitting Physician Briscoe Deutscher, MD [id number], Barry Dienes, MD  Brief Summary: Patient is a 89 y.o.  male with history of chronic right leg wound-followed by wound care-s/p recent PCI to popliteal artery-presented with severe RLE pain/swelling/erythema following a cat scratch around 3/31.  Patient was found to have sepsis secondary to RLE cellulitis with streptococcal bacteremia and admitted to the hospitalist service.  Significant events: 4/3>> admitted to [facility]  Significant studies: 4/3>> CT right fibula/tibia: Extensive subcutaneous edema-no fluid collections-edematous changes appears superficial to muscular fascia. 4/4>> echo: EF 50%-no obvious vegetation.   Significant microbiology data: 4/3>> blood culture: Group A strep 4/4>> blood culture: No growth  Procedures: Bilateral lower extremity venous duplex.  Unremarkable. Bilateral lower extremity ABI.  Abnormal   Consults: ID VVS  Subjective: Patient in bed, appears comfortable, denies any headache, no fever, no chest pain or pressure, no shortness of breath , no abdominal pain. No new focal weakness. Bilateral lower extremity pain mildly improved  Objective: Vitals: Blood pressure 122/64, pulse 80, temperature 98.4 F (36.9 C), temperature source Oral, resp. rate (!) 21, height 6' (1.829 m), weight 107.9 kg, SpO2 92%.   Exam:  Awake Alert, No new F.N deficits, Normal affect [place].AT,PERRAL Supple Neck, No JVD,   Symmetrical Chest wall movement, Good air movement bilaterally, few rales RRR,No Gallops, Rubs or new Murmurs,  +ve B.Sounds, Abd Soft, No tenderness,   Right lower extremity intense cellulitis , no signs of compartment syndrome, left knee is examined stable.  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 Being seen by VVS, ID and Orthopedics Dr. Lajoyce Corners Was on combination of IV penicillin and Zyvox, remains on IV penicillin G, Zyvox was stopped by ID on 06/21/2023. TTE negative for vegetation, lateral lower extremity venous duplex unremarkable, ABIs abnormal bilaterally, repeat blood cultures negative thus far. Requested patient to keep right lower extremity elevated with 3 pillows, nursing orders placed and staff updated, currently no signs of compartment syndrome.   PAD-s/p intravascular lithotripsy of above-knee popliteal artery/angioplasty of right popliteal artery/SFA on 3/3 Appreciate wound care consult Vascular surgery following-bilateral ABIs are abnormal  Some left knee pain.  Lower extremity venous duplex negative for DVT, x-ray shows chronic arthritis.  Trial of colchicine and continue to monitor with supportive care.  Uric acid normal limits.  Hyponatremia.  Mixed picture, urine sodium under 10 but osmolality much higher than serum, received a liter and a half of IV fluids on 06/21/2023 with worsening sodium, case discussed with nephrology Dr. Signe Colt.  Fluid restriction and trial of Lasix and monitor.  ? Mild dysphagia - SLP eval  Persistent atrial fibrillation Telemetry monitoring Rate controlled with oral beta-blocker On Librexa AF trial drug (comparing Eliquis versus milvexian -factor XI inhibitor)  HTN BP stable-continue metoprolol Hold HCTZ for now  HLD Statin  New diagnosis  of DM-2 (A1c 6.4 on 4/4) SSI for now Metformin on discharge.  Recent Labs    06/21/23 1631 06/21/23 1938 06/22/23 0756   GLUCAP 127* 140* 116*     BMI Estimated body mass index is 32.26 kg/m as calculated from the following:   Height as of this encounter: 6' (1.829 m).   Weight as of this encounter: 107.9 kg.   Code status:   Code Status: Full Code   DVT Prophylaxis: STUDY - LIBREXIA-AF - apixaban 5 mg or placebo capsule (PI-Sethi)    Family Communication:   Spouse, son Lorin Picket, daughter bedside on 06/20/2023 in detail.    Called and updated son Lorin Picket [phone] in detail on 06/21/2023 over the phone, 06/22/23   Disposition Plan: Status is: Inpatient Remains inpatient appropriate because: Severity of illness   Planned Discharge Destination:CIR   Diet: Diet Order             Diet Heart Room service appropriate? Yes; Fluid consistency: Thin; Fluid restriction: 1200 mL Fluid  Diet effective now                    MEDICATIONS: Scheduled Meds:  clopidogrel  75 mg Oral Daily   colchicine  0.6 mg Oral Daily   docusate sodium  100 mg Oral BID   ezetimibe  10 mg Oral Daily   finasteride  5 mg Oral Daily   furosemide  40 mg Intravenous Once   insulin aspart  0-9 Units Subcutaneous TID WC   metoprolol succinate  25 mg Oral Q12H   mupirocin ointment   Topical Daily   nutrition supplement (JUVEN)  1 packet Oral BID BM   pantoprazole  40 mg Oral Q1200   polyethylene glycol  17 g Oral Daily   rosuvastatin  20 mg Oral Daily   LIBREXIA-AF apixaban or placebo  5 mg Oral BID   LIBREXIA-AF [id number] (Milvexian) or placebo  1 tablet Oral BID   Continuous Infusions:  penicillin G potassium 9 Million Units in dextrose 5 % 500 mL CONTINUOUS infusion 9 Million Units (06/21/23 2125)   PRN Meds:.acetaminophen **OR** acetaminophen, guaiFENesin-dextromethorphan, morphine injection, ondansetron **OR** ondansetron (ZOFRAN) IV, mouth rinse, oxyCODONE   I have personally reviewed following labs and imaging studies  LABORATORY DATA:  Recent Labs  Lab 06/17/23 0430 06/18/23 0449 06/19/23 0558  06/20/23 0551 06/21/23 0431 06/22/23 0433  WBC 18.9* 18.8* 18.5* 19.1* 15.9* 12.0*  HGB 12.3* 11.6* 11.4* 11.7* 11.5* 11.5*  HCT 37.0* 35.9* 34.5* 36.0* 34.4* 34.1*  PLT 194 160 191 227 230 260  MCV 85.8 87.6 86.3 86.5 85.8 84.6  MCH 28.5 28.3 28.5 28.1 28.7 28.5  MCHC 33.2 32.3 33.0 32.5 33.4 33.7  RDW 15.4 15.7* 15.4 15.4 15.2 15.1  LYMPHSABS 1.2  --  1.3 1.3 1.2 1.1  MONOABS 1.2*  --  1.1* 1.6* 1.3* 0.8  EOSABS 0.0  --  0.1 0.0 0.0 0.1  BASOSABS 0.0  --  0.1 0.1 0.0 0.0    Recent Labs  Lab 06/17/23 0430 06/17/23 0813 06/17/23 1414 06/18/23 0449 06/19/23 0558 06/20/23 0551 06/21/23 0431 06/22/23 0433  NA 134*  --   --  135 132* 130* 127* 125*  K 3.7  --   --  3.7 3.5 4.0 3.9 3.8  CL 100  --   --  99 98 95* 93* 90*  CO2 22  --   --  23 23 24 22 22   ANIONGAP 12  --   --  13  11 11 12 13   GLUCOSE 134*  --   --  135* 129* 137* 129* 130*  BUN 26*  --   --  20 15 19 23 23   CREATININE 1.42*  --   --  1.21 1.03 1.19 1.07 1.01  AST 29  --   --  34  --   --   --   --   ALT 17  --   --  19  --   --   --   --   ALKPHOS 56  --   --  56  --   --   --   --   BILITOT 1.3*  --   --  1.5*  --   --   --   --   ALBUMIN 3.4*  --   --  2.4*  --   --   --   --   CRP  --   --   --   --   --  25.8* 27.6* 27.5*  PROCALCITON  --   --   --   --  3.88 2.77 1.50 1.09  LATICACIDVEN 2.8* 2.4*  --   --   --   --   --   --   INR 2.0*  --   --  1.6*  --   --   --   --   HGBA1C  --   --  6.4*  --   --   --   --   --   BNP  --   --   --   --   --  475.0* 546.8* 469.2*  MG  --   --   --   --   --  1.7 2.0 2.0  CALCIUM 8.6*  --   --  8.3* 8.1* 8.2* 7.8* 7.9*      Recent Labs  Lab 06/17/23 0430 06/17/23 0813 06/17/23 1414 06/18/23 0449 06/19/23 0558 06/20/23 0551 06/21/23 0431 06/22/23 0433  CRP  --   --   --   --   --  25.8* 27.6* 27.5*  PROCALCITON  --   --   --   --  3.88 2.77 1.50 1.09  LATICACIDVEN 2.8* 2.4*  --   --   --   --   --   --   INR 2.0*  --   --  1.6*  --   --   --   --    HGBA1C  --   --  6.4*  --   --   --   --   --   BNP  --   --   --   --   --  475.0* 546.8* 469.2*  MG  --   --   --   --   --  1.7 2.0 2.0  CALCIUM 8.6*  --   --  8.3* 8.1* 8.2* 7.8* 7.9*    --------------------------------------------------------------------------------------------------------------- Lab Results  Component Value Date   CHOL 114 07/14/2019   HDL 50 07/14/2019   LDLCALC 45 07/14/2019   TRIG 101 07/14/2019   CHOLHDL 2.3 07/14/2019    Lab Results  Component Value Date   HGBA1C 6.4 (H) 06/17/2023      MICROBIOLOGY: Recent Results (from the past 240 hours)  Blood Culture (routine x 2)     Status: Abnormal   Collection Time: 06/17/23  4:03 AM   Specimen: BLOOD RIGHT ARM  Result Value Ref Range Status   Specimen  Description   Final    BLOOD RIGHT ARM Performed at [facility], [address] [place], [place] [address]    Special Requests   Final    BOTTLES DRAWN AEROBIC AND ANAEROBIC Blood Culture adequate volume Performed at [facility], [address], [place], [place] [address]    Culture  Setup Time   Final    GRAM POSITIVE COCCI IN CHAINS ANAEROBIC BOTTLE ONLY CRITICAL RESULT CALLED TO, READ BACK BY AND VERIFIED WITH: PHARMD G ABBOTT 06/18/2023 @ 0042 BY AB    Culture (A)  Final    GROUP A STREP (S.PYOGENES) ISOLATED HEALTH DEPARTMENT NOTIFIED Performed at [facility], [address], [place], [place] [address]    Report Status 06/20/2023 FINAL  Final   Organism ID, Bacteria GROUP A STREP (S.PYOGENES) ISOLATED  Final      Susceptibility   Group a strep (s.pyogenes) isolated - MIC*    PENICILLIN <=0.06 SENSITIVE Sensitive     CEFTRIAXONE <=0.12 SENSITIVE Sensitive     ERYTHROMYCIN 2 RESISTANT Resistant     LEVOFLOXACIN 0.5 SENSITIVE Sensitive     VANCOMYCIN 0.5 SENSITIVE Sensitive     * GROUP A STREP (S.PYOGENES) ISOLATED  Blood Culture ID Panel (Reflexed)     Status: Abnormal   Collection Time:  06/17/23  4:03 AM  Result Value Ref Range Status   Enterococcus faecalis NOT DETECTED NOT DETECTED Final   Enterococcus Faecium NOT DETECTED NOT DETECTED Final   Listeria monocytogenes NOT DETECTED NOT DETECTED Final   Staphylococcus species NOT DETECTED NOT DETECTED Final   Staphylococcus aureus (BCID) NOT DETECTED NOT DETECTED Final   Staphylococcus epidermidis NOT DETECTED NOT DETECTED Final   Staphylococcus lugdunensis NOT DETECTED NOT DETECTED Final   Streptococcus species DETECTED (A) NOT DETECTED Final    Comment: CRITICAL RESULT CALLED TO, READ BACK BY AND VERIFIED WITH: PHARMD G ABBOTT 06/18/2023 @ 0042 BY AB    Streptococcus agalactiae NOT DETECTED NOT DETECTED Final   Streptococcus pneumoniae NOT DETECTED NOT DETECTED Final   Streptococcus pyogenes DETECTED (A) NOT DETECTED Final    Comment: CRITICAL RESULT CALLED TO, READ BACK BY AND VERIFIED WITH: PHARMD G ABBOTT 06/18/2023 @ 0042 BY AB    A.calcoaceticus-baumannii NOT DETECTED NOT DETECTED Final   Bacteroides fragilis NOT DETECTED NOT DETECTED Final   Enterobacterales NOT DETECTED NOT DETECTED Final   Enterobacter cloacae complex NOT DETECTED NOT DETECTED Final   Escherichia coli NOT DETECTED NOT DETECTED Final   Klebsiella aerogenes NOT DETECTED NOT DETECTED Final   Klebsiella oxytoca NOT DETECTED NOT DETECTED Final   Klebsiella pneumoniae NOT DETECTED NOT DETECTED Final   Proteus species NOT DETECTED NOT DETECTED Final   Salmonella species NOT DETECTED NOT DETECTED Final   Serratia marcescens NOT DETECTED NOT DETECTED Final   Haemophilus influenzae NOT DETECTED NOT DETECTED Final   Neisseria meningitidis NOT DETECTED NOT DETECTED Final   Pseudomonas aeruginosa NOT DETECTED NOT DETECTED Final   Stenotrophomonas maltophilia NOT DETECTED NOT DETECTED Final   Candida albicans NOT DETECTED NOT DETECTED Final   Candida auris NOT DETECTED NOT DETECTED Final   Candida glabrata NOT DETECTED NOT DETECTED Final   Candida krusei  NOT DETECTED NOT DETECTED Final   Candida parapsilosis NOT DETECTED NOT DETECTED Final   Candida tropicalis NOT DETECTED NOT DETECTED Final   Cryptococcus neoformans/gattii NOT DETECTED NOT DETECTED Final    Comment: Performed at [facility], [address], [place], [place] [address]  Blood Culture (  routine x 2)     Status: Abnormal   Collection Time: 06/17/23  4:08 AM   Specimen: BLOOD RIGHT HAND  Result Value Ref Range Status   Specimen Description   Final    BLOOD RIGHT HAND Performed at [facility], [address] [place], [place] [address]    Special Requests   Final    BOTTLES DRAWN AEROBIC AND ANAEROBIC Blood Culture adequate volume Performed at [facility], [address], [place], [place] [address]    Culture  Setup Time   Final    GRAM POSITIVE COCCI IN PAIRS IN CHAINS AEROBIC BOTTLE ONLY CRITICAL VALUE NOTED.  VALUE IS CONSISTENT WITH PREVIOUSLY REPORTED AND CALLED VALUE.    Culture (A)  Final    GROUP A STREP (S.PYOGENES) ISOLATED SUSCEPTIBILITIES PERFORMED ON PREVIOUS CULTURE WITHIN THE LAST 5 DAYS. Performed at [facility], [address], [place], [place] [address]    Report Status 06/20/2023 FINAL  Final  Culture, blood (Routine X 2) w Reflex to ID Panel     Status: None (Preliminary result)   Collection Time: 06/18/23  6:10 PM   Specimen: BLOOD  Result Value Ref Range Status   Specimen Description BLOOD SITE NOT SPECIFIED  Final   Special Requests   Final    BOTTLES DRAWN AEROBIC AND ANAEROBIC Blood Culture adequate volume   Culture   Final    NO GROWTH 4 DAYS Performed at [facility], [address], [place], [place] [address]    Report Status PENDING  Incomplete  Culture, blood (Routine X 2) w Reflex to ID Panel     Status: None (Preliminary result)   Collection Time: 06/18/23  6:12 PM   Specimen: BLOOD  Result Value Ref Range Status   Specimen Description BLOOD SITE NOT SPECIFIED  Final    Special Requests   Final    BOTTLES DRAWN AEROBIC AND ANAEROBIC Blood Culture results may not be optimal due to an inadequate volume of blood received in culture bottles   Culture   Final    NO GROWTH 4 DAYS Performed at [facility], [address] [place], [place] [address]    Report Status PENDING  Incomplete  Culture, blood (Routine X 2) w Reflex to ID Panel     Status: None (Preliminary result)   Collection Time: 06/19/23  5:56 AM   Specimen: BLOOD  Result Value Ref Range Status   Specimen Description BLOOD BLOOD LEFT HAND  Final   Special Requests   Final    BOTTLES DRAWN AEROBIC AND ANAEROBIC Blood Culture results may not be optimal due to an inadequate volume of blood received in culture bottles   Culture   Final    NO GROWTH 3 DAYS Performed at [facility], [address] [place], [place] [address]    Report Status PENDING  Incomplete  Culture, blood (Routine X 2) w Reflex to ID Panel     Status: None (Preliminary result)   Collection Time: 06/19/23  5:57 AM   Specimen: BLOOD  Result Value Ref Range Status   Specimen Description BLOOD BLOOD LEFT ARM  Final   Special Requests   Final    BOTTLES DRAWN AEROBIC AND ANAEROBIC Blood Culture results may not be optimal due to an inadequate volume of blood received in culture bottles   Culture   Final    NO GROWTH 3 DAYS Performed at [facility], [address], [place], [place] [address]    Report Status PENDING  Incomplete    RADIOLOGY STUDIES/RESULTS: No results found.    LOS: 5 days   Signature  -    Susa Raring M.D on 06/22/2023 at 8:09 AM   -  To page go to [email or web address]

## 2023-06-22 NOTE — Progress Notes (Signed)
  Inpatient Rehabilitation Admissions Coordinator   Met with patient to review goals and expectations of a possible CIR admit. He is open to CIR at this time. I discussed that I need to see how he tolerates moving with therapy on acute to determine if he is a candidate. I will follow up in the am.  Ottie Glazier, RN, MSN Rehab Admissions Coordinator [phone] 06/22/2023 2:29 PM

## 2023-06-22 NOTE — Progress Notes (Signed)
 Physical Therapy Treatment Patient Details Name: Logan French MRN: [id number] DOB: 1934/08/12 Today's Date: 06/22/2023   History of Present Illness Pt is an 89 y/o male admitted 4/3 for RLE cellulitis and nonhealing wound. +for bacetermia and hx of recent cat scratch. PMH: prostate cancer, anemia, HTN, HLD, a fib, b12 deficiency, PAD s/p vascular intervention.    PT Comments  Eager to get out of bed and work with therapy. Overall he appears more alert and cognizant of goals and expectations today. He was hopeful to get into recliner using walker, and hopeful to walk to curtain in room tomorrow. Pt still requires quite significant physical assist (Max A +2) for sit to stand and pivot transfer to recliner with walker. (RN notified to use Stedy rather than RW.) Pt has great difficulty standing fully upright and unloading feet individually to sequence steps to recliner efficiently and safely without BIL support from therapy team. Increased bil LE pain, difficulty moving LLE in bed and especially while standing. Will continue to push pt aggressively towards acute goals as tolerated. He is hopeful for a quick recovery and we are happy to help him acutely. Patient will continue to benefit from skilled physical therapy services to further improve independence with functional mobility.     If plan is discharge home, recommend the following: Two people to help with walking and/or transfers;Two people to help with bathing/dressing/bathroom;Assistance with cooking/housework;Assist for transportation;Help with stairs or ramp for entrance   Can travel by private vehicle        Equipment Recommendations  Rolling walker (2 wheels);Wheelchair (measurements PT);Wheelchair cushion (measurements PT)    Recommendations for Other Services Rehab consult     Precautions / Restrictions Precautions Precautions: Fall Recall of Precautions/Restrictions: Impaired Required Braces or Orthoses: Other Brace Other  Brace: PRAFO RLE Restrictions Weight Bearing Restrictions Per Provider Order: No     Mobility  Bed Mobility Overal bed mobility: Needs Assistance Bed Mobility: Supine to Sit     Supine to sit: +2 for safety/equipment, HOB elevated, Mod assist     General bed mobility comments: Mod assist +2 for safety for trunk support to rise and bed pad to spin/scoot to EOB, light assist for LEs to EOB, significant difficulty moving LLE today.    Transfers Overall transfer level: Needs assistance Equipment used: Rolling walker (2 wheels) Transfers: Sit to/from Stand, Bed to chair/wheelchair/BSC Sit to Stand: +2 physical assistance, +2 safety/equipment, Max assist, From elevated surface   Step pivot transfers: Max assist, +2 physical assistance, +2 safety/equipment, From elevated surface       General transfer comment: Max assist +2 for boost to stand from elevated surface of bed. BIL knee pain with flexion to 90 deg prior to rising and when sitting down. Max cues for set-up, hand placement and technique. Once standing has difficulty fully extending knees, and with bringing hips and back to neutral position, remaining flexed. Max assist +2 for step pivot transfer.  Was able to pick up RLE a couple of times with considerable help for weight shift. Unable to move LLE without significant assist. Knees buckle slightly but shows good UE support. Difficulty with pivot, needing recliner brought to pt to sit safely.    Ambulation/Gait               General Gait Details: unable at this time.   Stairs             Wheelchair Mobility     Tilt Bed    Modified  Rankin (Stroke Patients Only)       Balance Overall balance assessment: Needs assistance Sitting-balance support: No upper extremity supported, Feet supported Sitting balance-Leahy Scale: Fair     Standing balance support: Bilateral upper extremity supported, During functional activity Standing balance-Leahy Scale:  Zero Standing balance comment: BUE support when standing                            Communication Communication Communication: No apparent difficulties  Cognition Arousal: Alert Behavior During Therapy: WFL for tasks assessed/performed   PT - Cognitive impairments: Attention, Initiation, Sequencing, Problem solving (Oriented x4)                       PT - Cognition Comments: Still having difficulty sequencing LEs Following commands: Impaired Following commands impaired: Only follows one step commands consistently    Cueing Cueing Techniques: Verbal cues, Gestural cues, Tactile cues  Exercises General Exercises - Lower Extremity Ankle Circles/Pumps: AROM, Both, 10 reps, Supine Quad Sets: Strengthening, Both, 10 reps, Supine    General Comments General comments (skin integrity, edema, etc.): Bed wet (urine?). assisted with peri-care in standing.      Pertinent Vitals/Pain Pain Assessment Pain Assessment: Faces Faces Pain Scale: Hurts whole lot Pain Location: BIL LEs with movement. Pain Descriptors / Indicators: Grimacing, Guarding, Aching, Sore Pain Intervention(s): Monitored during session, Repositioned, Limited activity within patient's tolerance    Home Living                          Prior Function            PT Goals (current goals can now be found in the care plan section) Acute Rehab PT Goals Patient Stated Goal: Get well, reduce pain PT Goal Formulation: With patient Time For Goal Achievement: 07/03/23 Potential to Achieve Goals: Good Progress towards PT goals: Progressing toward goals    Frequency    Min 2X/week      PT Plan      Co-evaluation              AM-PAC PT "6 Clicks" Mobility   Outcome Measure  Help needed turning from your back to your side while in a flat bed without using bedrails?: A Little Help needed moving from lying on your back to sitting on the side of a flat bed without using bedrails?: A  Lot Help needed moving to and from a bed to a chair (including a wheelchair)?: Total Help needed standing up from a chair using your arms (e.g., wheelchair or bedside chair)?: Total Help needed to walk in hospital room?: Total Help needed climbing 3-5 steps with a railing? : Total 6 Click Score: 9    End of Session Equipment Utilized During Treatment: Gait belt Activity Tolerance: Patient limited by pain Patient left: with call bell/phone within reach;in chair;with chair alarm set;with family/visitor present Nurse Communication: Mobility status;Need for lift equipment (Recommend Stedy) PT Visit Diagnosis: Unsteadiness on feet (R26.81);Muscle weakness (generalized) (M62.81);Difficulty in walking, not elsewhere classified (R26.2);Pain Pain - Right/Left: Right Pain - part of body: Leg     Time: 1414-1440 PT Time Calculation (min) (ACUTE ONLY): 26 min  Charges:    $Therapeutic Activity: 23-37 mins PT General Charges $$ ACUTE PT VISIT: 1 Visit                     Kathlyn Sacramento, PT, DPT  [facility]  Rehabilitation Services Physical Therapist Office: [phone] Website: [email or web address]    Berton Mount 06/22/2023, 3:50 PM

## 2023-06-22 NOTE — Progress Notes (Addendum)
 [facility]  Date of Admission:  06/17/2023     Reason for Follow Up: Cellulitis of right lower extremity  Total days of antibiotics 7         ASSESSMENT:  [name] blood cultures from 06/18/2023 and 06/19/2023 remain without growth to date in the setting of Streptococcus pyogenes bacteremia with cellulitis of the right lower leg.  Continues to improve clinically.  Right thigh stable if not slightly improved.  Discussed plan of care to continue to monitor blood cultures and continue with current dose of penicillin for at least 1 more day prior to change to oral high-dose amoxicillin.  Continue to elevate leg with compression wrap as tolerated.  Continue universal/standard precautions.  Remaining medical and supportive care per internal medicine.  PLAN:  Continue current dose of penicillin. Monitor blood cultures for clearance of bacteremia. Elevate leg and compression as tolerated. Continue universal/standard precautions. Remaining medical and supportive care per internal medicine.  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)   Sepsis due to undetermined organism (HCC)   Cellulitis of right lower leg    clopidogrel  75 mg Oral Daily   colchicine  0.6 mg Oral Daily   docusate sodium  100 mg Oral BID   ezetimibe  10 mg Oral Daily   finasteride  5 mg Oral Daily   insulin aspart  0-9 Units Subcutaneous TID WC   metoprolol succinate  25 mg Oral Q12H   mupirocin ointment   Topical Daily   nutrition supplement (JUVEN)  1 packet Oral BID BM   pantoprazole  40 mg Oral Q1200   polyethylene glycol  17 g Oral Daily   rosuvastatin  20 mg Oral Daily   LIBREXIA-AF apixaban or placebo  5 mg Oral BID   LIBREXIA-AF [id number] (Milvexian) or placebo  1 tablet Oral BID    SUBJECTIVE:  Afebrile overnight with improving  leukocytosis with white blood cell count down to 12,000.  Feeling better.  Tolerating antibiotics with no adverse side effects and denies fevers, chills, or sweats.  Still has discomfort in his right lower leg.  Wife visiting at bedside.  No Known Allergies   Review of Systems: Review of Systems  Constitutional:  Negative for chills, fever and weight loss.  Respiratory:  Negative for cough, shortness of breath and wheezing.   Cardiovascular:  Positive for leg swelling. Negative for chest pain.  Gastrointestinal:  Negative for abdominal pain, constipation, diarrhea, nausea and vomiting.  Skin:  Positive for rash.      OBJECTIVE: Vitals:   06/21/23 1938 06/22/23 0116 06/22/23 0500 06/22/23 0759  BP: 129/65 123/76  122/64  Pulse: 74 73  80  Resp: 19 18  (!) 21  Temp: 98.2 F (36.8 C) 98.4 F (36.9 C)  98.4 F (36.9 C)  TempSrc: Oral Oral  Oral  SpO2: 94% 92%  92%  Weight:   107.9 kg   Height:       Body mass index is 32.26 kg/m.  Physical Exam Constitutional:      General: He is not in acute distress.    Appearance: He is well-developed.  Cardiovascular:     Rate and Rhythm: Normal rate and regular rhythm.     Heart sounds: Normal heart sounds.  Pulmonary:     Effort: Pulmonary effort is normal.     Breath  sounds: Normal breath sounds.  Skin:    General: Skin is warm and dry.     Comments: Right lower extremity wrapped in Ace wrap with right thigh slightly red.  Neurological:     Mental Status: He is alert and oriented to person, place, and time.  Psychiatric:        Mood and Affect: Mood normal.     Lab Results Lab Results  Component Value Date   WBC 12.0 (H) 06/22/2023   HGB 11.5 (L) 06/22/2023   HCT 34.1 (L) 06/22/2023   MCV 84.6 06/22/2023   PLT 260 06/22/2023    Lab Results  Component Value Date   CREATININE 1.01 06/22/2023   BUN 23 06/22/2023   NA 125 (L) 06/22/2023   K 3.8 06/22/2023   CL 90 (L) 06/22/2023   CO2 22 06/22/2023    Lab Results   Component Value Date   ALT 19 06/18/2023   AST 34 06/18/2023   ALKPHOS 56 06/18/2023   BILITOT 1.5 (H) 06/18/2023     Microbiology: Recent Results (from the past 240 hours)  Blood Culture (routine x 2)     Status: Abnormal   Collection Time: 06/17/23  4:03 AM   Specimen: BLOOD RIGHT ARM  Result Value Ref Range Status   Specimen Description   Final    BLOOD RIGHT ARM Performed at [facility], [address] [place], [place] [address]    Special Requests   Final    BOTTLES DRAWN AEROBIC AND ANAEROBIC Blood Culture adequate volume Performed at [facility], [address] [place], [place] [address]    Culture  Setup Time   Final    GRAM POSITIVE COCCI IN CHAINS ANAEROBIC BOTTLE ONLY CRITICAL RESULT CALLED TO, READ BACK BY AND VERIFIED WITH: PHARMD G ABBOTT 06/18/2023 @ 0042 BY AB    Culture (A)  Final    GROUP A STREP (S.PYOGENES) ISOLATED HEALTH DEPARTMENT NOTIFIED Performed at [facility], [address] [place], [place] [address]    Report Status 06/20/2023 FINAL  Final   Organism ID, Bacteria GROUP A STREP (S.PYOGENES) ISOLATED  Final      Susceptibility   Group a strep (s.pyogenes) isolated - MIC*    PENICILLIN <=0.06 SENSITIVE Sensitive     CEFTRIAXONE <=0.12 SENSITIVE Sensitive     ERYTHROMYCIN 2 RESISTANT Resistant     LEVOFLOXACIN 0.5 SENSITIVE Sensitive     VANCOMYCIN 0.5 SENSITIVE Sensitive     * GROUP A STREP (S.PYOGENES) ISOLATED  Blood Culture ID Panel (Reflexed)     Status: Abnormal   Collection Time: 06/17/23  4:03 AM  Result Value Ref Range Status   Enterococcus faecalis NOT DETECTED NOT DETECTED Final   Enterococcus Faecium NOT DETECTED NOT DETECTED Final   Listeria monocytogenes NOT DETECTED NOT DETECTED Final   Staphylococcus species NOT DETECTED NOT DETECTED Final   Staphylococcus aureus (BCID) NOT DETECTED NOT DETECTED Final   Staphylococcus epidermidis NOT DETECTED NOT DETECTED Final    Staphylococcus lugdunensis NOT DETECTED NOT DETECTED Final   Streptococcus species DETECTED (A) NOT DETECTED Final    Comment: CRITICAL RESULT CALLED TO, READ BACK BY AND VERIFIED WITH: PHARMD G ABBOTT 06/18/2023 @ 0042 BY AB    Streptococcus agalactiae NOT DETECTED NOT DETECTED Final   Streptococcus pneumoniae NOT DETECTED NOT DETECTED Final   Streptococcus pyogenes DETECTED (A) NOT DETECTED Final    Comment: CRITICAL RESULT CALLED TO, READ BACK BY AND VERIFIED WITH: PHARMD G ABBOTT 06/18/2023 @  0042 BY AB    A.calcoaceticus-baumannii NOT DETECTED NOT DETECTED Final   Bacteroides fragilis NOT DETECTED NOT DETECTED Final   Enterobacterales NOT DETECTED NOT DETECTED Final   Enterobacter cloacae complex NOT DETECTED NOT DETECTED Final   Escherichia coli NOT DETECTED NOT DETECTED Final   Klebsiella aerogenes NOT DETECTED NOT DETECTED Final   Klebsiella oxytoca NOT DETECTED NOT DETECTED Final   Klebsiella pneumoniae NOT DETECTED NOT DETECTED Final   Proteus species NOT DETECTED NOT DETECTED Final   Salmonella species NOT DETECTED NOT DETECTED Final   Serratia marcescens NOT DETECTED NOT DETECTED Final   Haemophilus influenzae NOT DETECTED NOT DETECTED Final   Neisseria meningitidis NOT DETECTED NOT DETECTED Final   Pseudomonas aeruginosa NOT DETECTED NOT DETECTED Final   Stenotrophomonas maltophilia NOT DETECTED NOT DETECTED Final   Candida albicans NOT DETECTED NOT DETECTED Final   Candida auris NOT DETECTED NOT DETECTED Final   Candida glabrata NOT DETECTED NOT DETECTED Final   Candida krusei NOT DETECTED NOT DETECTED Final   Candida parapsilosis NOT DETECTED NOT DETECTED Final   Candida tropicalis NOT DETECTED NOT DETECTED Final   Cryptococcus neoformans/gattii NOT DETECTED NOT DETECTED Final    Comment: Performed at [facility], [address], [place], [place] [address]  Blood Culture (routine x 2)     Status: Abnormal   Collection Time: 06/17/23  4:08 AM   Specimen: BLOOD  RIGHT HAND  Result Value Ref Range Status   Specimen Description   Final    BLOOD RIGHT HAND Performed at [facility], [address], [place], [place] [address]    Special Requests   Final    BOTTLES DRAWN AEROBIC AND ANAEROBIC Blood Culture adequate volume Performed at [facility], [address] [place], [place] [address]    Culture  Setup Time   Final    GRAM POSITIVE COCCI IN PAIRS IN CHAINS AEROBIC BOTTLE ONLY CRITICAL VALUE NOTED.  VALUE IS CONSISTENT WITH PREVIOUSLY REPORTED AND CALLED VALUE.    Culture (A)  Final    GROUP A STREP (S.PYOGENES) ISOLATED SUSCEPTIBILITIES PERFORMED ON PREVIOUS CULTURE WITHIN THE LAST 5 DAYS. Performed at [facility], [address] [place], [place] [address]    Report Status 06/20/2023 FINAL  Final  Culture, blood (Routine X 2) w Reflex to ID Panel     Status: None (Preliminary result)   Collection Time: 06/18/23  6:10 PM   Specimen: BLOOD  Result Value Ref Range Status   Specimen Description BLOOD SITE NOT SPECIFIED  Final   Special Requests   Final    BOTTLES DRAWN AEROBIC AND ANAEROBIC Blood Culture adequate volume   Culture   Final    NO GROWTH 4 DAYS Performed at [facility], [address], [place], [place] [address]    Report Status PENDING  Incomplete  Culture, blood (Routine X 2) w Reflex to ID Panel     Status: None (Preliminary result)   Collection Time: 06/18/23  6:12 PM   Specimen: BLOOD  Result Value Ref Range Status   Specimen Description BLOOD SITE NOT SPECIFIED  Final   Special Requests   Final    BOTTLES DRAWN AEROBIC AND ANAEROBIC Blood Culture results may not be optimal due to an inadequate volume of blood received in culture bottles   Culture   Final    NO GROWTH 4 DAYS Performed at [facility], [address] [place], [place] [address]  Report Status PENDING  Incomplete  Culture, blood (Routine X 2) w Reflex to ID Panel     Status: None  (Preliminary result)   Collection Time: 06/19/23  5:56 AM   Specimen: BLOOD  Result Value Ref Range Status   Specimen Description BLOOD BLOOD LEFT HAND  Final   Special Requests   Final    BOTTLES DRAWN AEROBIC AND ANAEROBIC Blood Culture results may not be optimal due to an inadequate volume of blood received in culture bottles   Culture   Final    NO GROWTH 3 DAYS Performed at [facility], [address] [place], [place] [address]    Report Status PENDING  Incomplete  Culture, blood (Routine X 2) w Reflex to ID Panel     Status: None (Preliminary result)   Collection Time: 06/19/23  5:57 AM   Specimen: BLOOD  Result Value Ref Range Status   Specimen Description BLOOD BLOOD LEFT ARM  Final   Special Requests   Final    BOTTLES DRAWN AEROBIC AND ANAEROBIC Blood Culture results may not be optimal due to an inadequate volume of blood received in culture bottles   Culture   Final    NO GROWTH 3 DAYS Performed at [facility], [address], [place], [place] [address]    Report Status PENDING  Incomplete     I have personally spent 26 minutes involved in face-to-face and non-face-to-face activities for this patient on the day of the visit. Professional time spent includes the following activities: Preparing to see the patient (review of tests), Obtaining and/or reviewing separately obtained history (admission/discharge record), Performing a medically appropriate examination and/or evaluation , Ordering medications/tests/procedures, referring and communicating with other health care professionals, Documenting clinical information in the EMR, Independently interpreting results (not separately reported), Communicating results to the patient/family/caregiver, Counseling and educating the patient/family/caregiver and Care coordination (not separately reported).   Marcos Eke, NP [facility]  06/22/2023  11:43 AM

## 2023-06-23 DIAGNOSIS — L03115 Cellulitis of right lower limb: Secondary | ICD-10-CM | POA: Diagnosis not present

## 2023-06-23 DIAGNOSIS — L538 Other specified erythematous conditions: Secondary | ICD-10-CM

## 2023-06-23 LAB — CBC WITH DIFFERENTIAL/PLATELET
Abs Immature Granulocytes: 0.13 10*3/uL — ABNORMAL HIGH (ref 0.00–0.07)
Basophils Absolute: 0 10*3/uL (ref 0.0–0.1)
Basophils Relative: 0 %
Eosinophils Absolute: 0.1 10*3/uL (ref 0.0–0.5)
Eosinophils Relative: 1 %
HCT: 33.5 % — ABNORMAL LOW (ref 39.0–52.0)
Hemoglobin: 11.3 g/dL — ABNORMAL LOW (ref 13.0–17.0)
Immature Granulocytes: 1 %
Lymphocytes Relative: 9 %
Lymphs Abs: 1.2 10*3/uL (ref 0.7–4.0)
MCH: 28.3 pg (ref 26.0–34.0)
MCHC: 33.7 g/dL (ref 30.0–36.0)
MCV: 84 fL (ref 80.0–100.0)
Monocytes Absolute: 0.9 10*3/uL (ref 0.1–1.0)
Monocytes Relative: 7 %
Neutro Abs: 10.6 10*3/uL — ABNORMAL HIGH (ref 1.7–7.7)
Neutrophils Relative %: 82 %
Platelets: 258 10*3/uL (ref 150–400)
RBC: 3.99 MIL/uL — ABNORMAL LOW (ref 4.22–5.81)
RDW: 15.3 % (ref 11.5–15.5)
WBC: 12.9 10*3/uL — ABNORMAL HIGH (ref 4.0–10.5)
nRBC: 0 % (ref 0.0–0.2)

## 2023-06-23 LAB — CULTURE, BLOOD (ROUTINE X 2)
Culture: NO GROWTH
Culture: NO GROWTH
Special Requests: ADEQUATE

## 2023-06-23 LAB — C-REACTIVE PROTEIN: CRP: 26.9 mg/dL — ABNORMAL HIGH (ref <1.0)

## 2023-06-23 LAB — BASIC METABOLIC PANEL WITH GFR
Anion gap: 11 (ref 5–15)
BUN: 25 mg/dL — ABNORMAL HIGH (ref 8–23)
CO2: 25 mmol/L (ref 22–32)
Calcium: 7.8 mg/dL — ABNORMAL LOW (ref 8.9–10.3)
Chloride: 91 mmol/L — ABNORMAL LOW (ref 98–111)
Creatinine, Ser: 1.03 mg/dL (ref 0.61–1.24)
GFR, Estimated: >60 mL/min (ref >60)
Glucose, Bld: 129 mg/dL — ABNORMAL HIGH (ref 70–99)
Potassium: 3.5 mmol/L (ref 3.5–5.1)
Sodium: 127 mmol/L — ABNORMAL LOW (ref 135–145)

## 2023-06-23 LAB — GLUCOSE, CAPILLARY
Glucose-Capillary: 172 mg/dL — ABNORMAL HIGH (ref 70–99)
Glucose-Capillary: 134 mg/dL — ABNORMAL HIGH (ref 70–99)
Glucose-Capillary: 110 mg/dL — ABNORMAL HIGH (ref 70–99)
Glucose-Capillary: 125 mg/dL — ABNORMAL HIGH (ref 70–99)

## 2023-06-23 LAB — BRAIN NATRIURETIC PEPTIDE: B Natriuretic Peptide: 621.9 pg/mL — ABNORMAL HIGH (ref 0.0–100.0)

## 2023-06-23 LAB — MAGNESIUM: Magnesium: 2 mg/dL (ref 1.7–2.4)

## 2023-06-23 LAB — UREA NITROGEN, URINE: Urea Nitrogen, Ur: 1545 mg/dL

## 2023-06-23 MED ORDER — POTASSIUM CHLORIDE CRYS ER 20 MEQ PO TBCR
40.0000 meq | EXTENDED_RELEASE_TABLET | Freq: Once | ORAL | Status: AC
  Administered 2023-06-23: 40 meq via ORAL
  Filled 2023-06-23: qty 2

## 2023-06-23 MED ORDER — FUROSEMIDE 10 MG/ML IJ SOLN
40.0000 mg | Freq: Once | INTRAMUSCULAR | Status: AC
  Administered 2023-06-23: 40 mg via INTRAVENOUS
  Filled 2023-06-23: qty 4

## 2023-06-23 MED ORDER — DICLOFENAC SODIUM 1 % EX GEL
2.0000 g | Freq: Four times a day (QID) | CUTANEOUS | Status: DC
  Administered 2023-06-23 – 2023-06-25 (×9): 2 g via TOPICAL
  Filled 2023-06-23: qty 100

## 2023-06-23 MED ORDER — AMOXICILLIN 500 MG PO CAPS
1000.0000 mg | ORAL_CAPSULE | Freq: Three times a day (TID) | ORAL | Status: DC
  Administered 2023-06-23 – 2023-06-25 (×7): 1000 mg via ORAL
  Filled 2023-06-23 (×7): qty 2

## 2023-06-23 MED ORDER — POTASSIUM CHLORIDE CRYS ER 20 MEQ PO TBCR
20.0000 meq | EXTENDED_RELEASE_TABLET | Freq: Once | ORAL | Status: AC
  Administered 2023-06-23: 20 meq via ORAL
  Filled 2023-06-23: qty 1

## 2023-06-23 NOTE — Progress Notes (Signed)
 [facility]  Date of Admission:  06/17/2023     Reason for Follow Up: Cellulitis of right lower extremity  Total days of antibiotics 8         ASSESSMENT:  [name] blood cultures from 4/4 and 4/5 remain without growth to date signifying clearance of bacteremia in the setting of group A streptococcus bacteremia with cellulitis of the right lower extremity.  Continue to monitor cultures until finalized.  Discussed plan of care to change IV penicillin to high-dose amoxicillin 1 g every 8 hours.  Plan for total treatment of 2 weeks following clearance of blood cultures from 4/4 with end date planned for 07/02/23. Continue elevation of leg and compression wrap as tolerated.  Continue standard/universal precautions.  Remaining medical and supportive care per internal medicine.  PLAN:  Change antibiotics from penicillin to high-dose amoxicillin 1 g p.o. 3 times daily. Continue to elevate and compress legs as tolerated. Monitor cultures for clearance of bacteremia. Will arrange follow-up in ID clinic. Okay for discharge from ID standpoint. Continue universal/standard precaution. Remaining medical and supportive care per internal medicine.  ID will sign off.  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)   Sepsis due to undetermined organism (HCC)   Cellulitis of right lower leg    amoxicillin  1,000 mg Oral Q8H   clopidogrel  75 mg Oral Daily   colchicine  0.6 mg Oral Daily   diclofenac Sodium  2 g Topical QID   docusate sodium  100 mg Oral BID   ezetimibe  10 mg Oral Daily   finasteride  5 mg Oral Daily   insulin aspart  0-9 Units Subcutaneous TID WC   metoprolol succinate  25 mg Oral Q12H   mupirocin ointment   Topical Daily   nutrition supplement (JUVEN)  1 packet Oral BID BM    pantoprazole  40 mg Oral Q1200   polyethylene glycol  17 g Oral Daily   potassium chloride  20 mEq Oral Once   rosuvastatin  20 mg Oral Daily   LIBREXIA-AF apixaban or placebo  5 mg Oral BID   LIBREXIA-AF [id number] (Milvexian) or placebo  1 tablet Oral BID    SUBJECTIVE:  Afebrile overnight with no acute events.  Tolerating antibiotics with no adverse side effects.  Working with physical therapy during visit.  Leg swelling is decreased.  Denies fevers, chills, or sweats.  No Known Allergies   Review of Systems: Review of Systems  Constitutional:  Negative for chills, fever and weight loss.  Respiratory:  Negative for cough, shortness of breath and wheezing.   Cardiovascular:  Positive for leg swelling. Negative for chest pain.  Gastrointestinal:  Negative for abdominal pain, constipation, diarrhea, nausea and vomiting.  Skin:  Negative for rash.      OBJECTIVE: Vitals:   06/22/23 2018 06/22/23 2326 06/23/23 0849 06/23/23 1252  BP: 130/75 131/78 (!) 110/96 (!) 144/73  Pulse:   73   Resp: 14 16 (!) 24 20  Temp: 99.4 F (37.4 C) 99 F (37.2 C) 97.9 F (36.6 C) 97.8 F (36.6 C)  TempSrc: Oral Oral Oral Oral  SpO2:   94% 94%  Weight:      Height:       Body mass index is 32.26 kg/m.  Physical Exam Constitutional:  General: He is not in acute distress.    Appearance: He is well-developed.  Cardiovascular:     Rate and Rhythm: Normal rate and regular rhythm.     Heart sounds: Normal heart sounds.  Pulmonary:     Effort: Pulmonary effort is normal.     Breath sounds: Normal breath sounds.  Skin:    General: Skin is warm and dry.     Comments: Right leg appears with slightly decreased edema.  Continues to have redness which is slowly improving.  Compression Ace wrap in place.  Neurological:     Mental Status: He is alert and oriented to person, place, and time.  Psychiatric:        Mood and Affect: Mood normal.     Lab Results Lab Results  Component  Value Date   WBC 12.9 (H) 06/23/2023   HGB 11.3 (L) 06/23/2023   HCT 33.5 (L) 06/23/2023   MCV 84.0 06/23/2023   PLT 258 06/23/2023    Lab Results  Component Value Date   CREATININE 1.03 06/23/2023   BUN 25 (H) 06/23/2023   NA 127 (L) 06/23/2023   K 3.5 06/23/2023   CL 91 (L) 06/23/2023   CO2 25 06/23/2023    Lab Results  Component Value Date   ALT 19 06/18/2023   AST 34 06/18/2023   ALKPHOS 56 06/18/2023   BILITOT 1.5 (H) 06/18/2023     Microbiology: Recent Results (from the past 240 hours)  Blood Culture (routine x 2)     Status: Abnormal   Collection Time: 06/17/23  4:03 AM   Specimen: BLOOD RIGHT ARM  Result Value Ref Range Status   Specimen Description   Final    BLOOD RIGHT ARM Performed at [facility], [address] [place], [place] [address]    Special Requests   Final    BOTTLES DRAWN AEROBIC AND ANAEROBIC Blood Culture adequate volume Performed at [facility], [address], [place], [place] [address]    Culture  Setup Time   Final    GRAM POSITIVE COCCI IN CHAINS ANAEROBIC BOTTLE ONLY CRITICAL RESULT CALLED TO, READ BACK BY AND VERIFIED WITH: PHARMD G ABBOTT 06/18/2023 @ 0042 BY AB    Culture (A)  Final    GROUP A STREP (S.PYOGENES) ISOLATED HEALTH DEPARTMENT NOTIFIED Performed at [facility], [address] [place], [place] [address]    Report Status 06/20/2023 FINAL  Final   Organism ID, Bacteria GROUP A STREP (S.PYOGENES) ISOLATED  Final      Susceptibility   Group a strep (s.pyogenes) isolated - MIC*    PENICILLIN <=0.06 SENSITIVE Sensitive     CEFTRIAXONE <=0.12 SENSITIVE Sensitive     ERYTHROMYCIN 2 RESISTANT Resistant     LEVOFLOXACIN 0.5 SENSITIVE Sensitive     VANCOMYCIN 0.5 SENSITIVE Sensitive     * GROUP A STREP (S.PYOGENES) ISOLATED  Blood Culture ID Panel (Reflexed)     Status: Abnormal   Collection Time: 06/17/23  4:03 AM  Result Value Ref Range Status   Enterococcus faecalis NOT  DETECTED NOT DETECTED Final   Enterococcus Faecium NOT DETECTED NOT DETECTED Final   Listeria monocytogenes NOT DETECTED NOT DETECTED Final   Staphylococcus species NOT DETECTED NOT DETECTED Final   Staphylococcus aureus (BCID) NOT DETECTED NOT DETECTED Final   Staphylococcus epidermidis NOT DETECTED NOT DETECTED Final   Staphylococcus lugdunensis NOT DETECTED NOT DETECTED Final   Streptococcus species DETECTED (A) NOT DETECTED Final    Comment:  CRITICAL RESULT CALLED TO, READ BACK BY AND VERIFIED WITH: PHARMD G ABBOTT 06/18/2023 @ 0042 BY AB    Streptococcus agalactiae NOT DETECTED NOT DETECTED Final   Streptococcus pneumoniae NOT DETECTED NOT DETECTED Final   Streptococcus pyogenes DETECTED (A) NOT DETECTED Final    Comment: CRITICAL RESULT CALLED TO, READ BACK BY AND VERIFIED WITH: PHARMD G ABBOTT 06/18/2023 @ 0042 BY AB    A.calcoaceticus-baumannii NOT DETECTED NOT DETECTED Final   Bacteroides fragilis NOT DETECTED NOT DETECTED Final   Enterobacterales NOT DETECTED NOT DETECTED Final   Enterobacter cloacae complex NOT DETECTED NOT DETECTED Final   Escherichia coli NOT DETECTED NOT DETECTED Final   Klebsiella aerogenes NOT DETECTED NOT DETECTED Final   Klebsiella oxytoca NOT DETECTED NOT DETECTED Final   Klebsiella pneumoniae NOT DETECTED NOT DETECTED Final   Proteus species NOT DETECTED NOT DETECTED Final   Salmonella species NOT DETECTED NOT DETECTED Final   Serratia marcescens NOT DETECTED NOT DETECTED Final   Haemophilus influenzae NOT DETECTED NOT DETECTED Final   Neisseria meningitidis NOT DETECTED NOT DETECTED Final   Pseudomonas aeruginosa NOT DETECTED NOT DETECTED Final   Stenotrophomonas maltophilia NOT DETECTED NOT DETECTED Final   Candida albicans NOT DETECTED NOT DETECTED Final   Candida auris NOT DETECTED NOT DETECTED Final   Candida glabrata NOT DETECTED NOT DETECTED Final   Candida krusei NOT DETECTED NOT DETECTED Final   Candida parapsilosis NOT DETECTED NOT  DETECTED Final   Candida tropicalis NOT DETECTED NOT DETECTED Final   Cryptococcus neoformans/gattii NOT DETECTED NOT DETECTED Final    Comment: Performed at [facility], [address] [place], [place] [address]  Blood Culture (routine x 2)     Status: Abnormal   Collection Time: 06/17/23  4:08 AM   Specimen: BLOOD RIGHT HAND  Result Value Ref Range Status   Specimen Description   Final    BLOOD RIGHT HAND Performed at [facility], [address], [place], [place] [address]    Special Requests   Final    BOTTLES DRAWN AEROBIC AND ANAEROBIC Blood Culture adequate volume Performed at [facility], [address] [place], [place] [address]    Culture  Setup Time   Final    GRAM POSITIVE COCCI IN PAIRS IN CHAINS AEROBIC BOTTLE ONLY CRITICAL VALUE NOTED.  VALUE IS CONSISTENT WITH PREVIOUSLY REPORTED AND CALLED VALUE.    Culture (A)  Final    GROUP A STREP (S.PYOGENES) ISOLATED SUSCEPTIBILITIES PERFORMED ON PREVIOUS CULTURE WITHIN THE LAST 5 DAYS. Performed at [facility], [address] [place], [place] [address]    Report Status 06/20/2023 FINAL  Final  Culture, blood (Routine X 2) w Reflex to ID Panel     Status: None   Collection Time: 06/18/23  6:10 PM   Specimen: BLOOD  Result Value Ref Range Status   Specimen Description BLOOD SITE NOT SPECIFIED  Final   Special Requests   Final    BOTTLES DRAWN AEROBIC AND ANAEROBIC Blood Culture adequate volume   Culture   Final    NO GROWTH 5 DAYS Performed at [facility], [address], [place], [place] [address]    Report Status 06/23/2023 FINAL  Final  Culture, blood (Routine X 2) w Reflex to ID Panel     Status: None   Collection Time: 06/18/23  6:12 PM   Specimen: BLOOD  Result Value Ref Range Status   Specimen Description BLOOD SITE NOT SPECIFIED  Final  Special Requests   Final    BOTTLES DRAWN AEROBIC AND ANAEROBIC Blood Culture results may not be optimal due  to an inadequate volume of blood received in culture bottles   Culture   Final    NO GROWTH 5 DAYS Performed at [facility], [address] [place], [place] [address]    Report Status 06/23/2023 FINAL  Final  Culture, blood (Routine X 2) w Reflex to ID Panel     Status: None (Preliminary result)   Collection Time: 06/19/23  5:56 AM   Specimen: BLOOD  Result Value Ref Range Status   Specimen Description BLOOD BLOOD LEFT HAND  Final   Special Requests   Final    BOTTLES DRAWN AEROBIC AND ANAEROBIC Blood Culture results may not be optimal due to an inadequate volume of blood received in culture bottles   Culture   Final    NO GROWTH 4 DAYS Performed at [facility], [address], [place], [place] [address]    Report Status PENDING  Incomplete  Culture, blood (Routine X 2) w Reflex to ID Panel     Status: None (Preliminary result)   Collection Time: 06/19/23  5:57 AM   Specimen: BLOOD  Result Value Ref Range Status   Specimen Description BLOOD BLOOD LEFT ARM  Final   Special Requests   Final    BOTTLES DRAWN AEROBIC AND ANAEROBIC Blood Culture results may not be optimal due to an inadequate volume of blood received in culture bottles   Culture   Final    NO GROWTH 4 DAYS Performed at [facility], [address], [place], [place] [address]    Report Status PENDING  Incomplete     Marcos Eke, NP [facility]  06/23/2023  2:46 PM

## 2023-06-23 NOTE — Progress Notes (Signed)
 Physical Therapy Treatment Patient Details Name: Logan French MRN: [id number] DOB: Apr 06, 1934 Today's Date: 06/23/2023   History of Present Illness Pt is an 89 y/o male admitted 4/3 for RLE cellulitis and nonhealing wound. +for bacetermia and hx of recent cat scratch. PMH: prostate cancer, anemia, HTN, HLD, a fib, b12 deficiency, PAD s/p vascular intervention.    PT Comments  Pt seen for PT tx with pt agreeable with encouragement. Pt's bed noted to be saturated with urine & pt reports he's aware but hasn't notified nursing staff yes 2/2 not wanting "to deal with the ordeal yet"; PT educated pt on importance of clean linen to prevent moisture associated skin breakdown. Pt requires MAX assist for supine>sit with HOB elevated, bed rails. Pt attempts STS from significantly elevated EOB but unable to clear buttocks from EOB even with MAX assist +2. Pt assisted back supine & performs rolling L<>R to allow for linen & gown change. Pt left in care of NT to get washed up. Continue to recommend post acute rehab to maximize independence with mobility & decrease caregiver burden.   If plan is discharge home, recommend the following: Two people to help with walking and/or transfers;Two people to help with bathing/dressing/bathroom;Assistance with cooking/housework;Assist for transportation;Help with stairs or ramp for entrance   Can travel by private vehicle        Equipment Recommendations  Rolling walker (2 wheels);Wheelchair (measurements PT);Wheelchair cushion (measurements PT)    Recommendations for Other Services Rehab consult     Precautions / Restrictions Precautions Precautions: Fall Recall of Precautions/Restrictions: Impaired Required Braces or Orthoses: Other Brace Other Brace: PRAFO RLE Restrictions Weight Bearing Restrictions Per Provider Order: No     Mobility  Bed Mobility Overal bed mobility: Needs Assistance Bed Mobility: Rolling, Supine to Sit Rolling: Mod assist    Supine to sit: Mod assist, +2 for safety/equipment, +2 for physical assistance, HOB elevated, Used rails Sit to supine: +2 for physical assistance, Max assist, Total assist, +2 for safety/equipment   General bed mobility comments: Pt is able to scoot to [facility] with bed in trendelenburg position & using BUE on bed rail to pull himself up in bed.    Transfers                   General transfer comment: Attempted STS from significantly elevated EOB with max assist +2 but pt unable to even clear buttocks from EOB. Pt also with limited ability to place BLE underneath BOS 2/2 decreased ROM & pain. Cuing for hand placement but pt with BUE on RW during STS attempt.    Ambulation/Gait                   Stairs             Wheelchair Mobility     Tilt Bed    Modified Rankin (Stroke Patients Only)       Balance Overall balance assessment: Needs assistance Sitting-balance support: Feet supported, Bilateral upper extremity supported, No upper extremity supported Sitting balance-Leahy Scale: Fair Sitting balance - Comments: CGA static sitting EOB.   Standing balance support: Bilateral upper extremity supported, Reliant on assistive device for balance Standing balance-Leahy Scale: Zero                              Communication Communication Communication: No apparent difficulties  Cognition Arousal: Alert Behavior During Therapy: WFL for tasks assessed/performed   PT -  Cognitive impairments: Attention, Initiation, Sequencing, Problem solving                       PT - Cognition Comments: verbose at times Following commands: Impaired Following commands impaired: Follows one step commands with increased time    Cueing Cueing Techniques: Verbal cues, Gestural cues, Visual cues, Tactile cues  Exercises      General Comments        Pertinent Vitals/Pain Pain Assessment Pain Assessment: Faces Faces Pain Scale: Hurts little more Pain  Location: BIL LEs with movement. Pain Descriptors / Indicators: Discomfort, Grimacing, Guarding Pain Intervention(s): Monitored during session, Limited activity within patient's tolerance, Repositioned    Home Living                          Prior Function            PT Goals (current goals can now be found in the care plan section) Acute Rehab PT Goals Patient Stated Goal: Get well, reduce pain PT Goal Formulation: With patient Time For Goal Achievement: 07/03/23 Potential to Achieve Goals: Fair Progress towards PT goals: Progressing toward goals    Frequency    Min 2X/week      PT Plan      Co-evaluation              AM-PAC PT "6 Clicks" Mobility   Outcome Measure  Help needed turning from your back to your side while in a flat bed without using bedrails?: A Lot Help needed moving from lying on your back to sitting on the side of a flat bed without using bedrails?: Total Help needed moving to and from a bed to a chair (including a wheelchair)?: Total Help needed standing up from a chair using your arms (e.g., wheelchair or bedside chair)?: Total Help needed to walk in hospital room?: Total Help needed climbing 3-5 steps with a railing? : Total 6 Click Score: 7    End of Session Equipment Utilized During Treatment: Gait belt Activity Tolerance: Patient tolerated treatment well Patient left: in bed;with call bell/phone within reach;with nursing/sitter in room   PT Visit Diagnosis: Unsteadiness on feet (R26.81);Muscle weakness (generalized) (M62.81);Difficulty in walking, not elsewhere classified (R26.2);Pain;Other abnormalities of gait and mobility (R26.89) Pain - Right/Left: Right Pain - part of body: Leg     Time: 1610-9604 PT Time Calculation (min) (ACUTE ONLY): 30 min  Charges:    $Therapeutic Activity: 23-37 mins PT General Charges $$ ACUTE PT VISIT: 1 Visit                     Aleda Grana, PT, DPT 06/23/23, 1:20  PM   Sandi Mariscal 06/23/2023, 1:18 PM

## 2023-06-23 NOTE — Progress Notes (Signed)
 PROGRESS NOTE        PATIENT DETAILS Name: Logan French Age: 89 y.o. Sex: male Date of Birth: [birth year] Admit Date: 06/17/2023 Admitting Physician Briscoe Deutscher, MD UJW:[id number], Barry Dienes, MD  Brief Summary: Patient is a 89 y.o.  male with history of chronic right leg wound-followed by wound care-s/p recent PCI to popliteal artery-presented with severe RLE pain/swelling/erythema following a cat scratch around 3/31.  Patient was found to have sepsis secondary to RLE cellulitis with streptococcal bacteremia and admitted to the hospitalist service.  Significant events: 4/3>> admitted to [facility]  Significant studies: 4/3>> CT right fibula/tibia: Extensive subcutaneous edema-no fluid collections-edematous changes appears superficial to muscular fascia. 4/4>> echo: EF 50%-no obvious vegetation.   Significant microbiology data: 4/3>> blood culture: Group A strep 4/4>> blood culture: No growth  Procedures: Bilateral lower extremity venous duplex.  Unremarkable. Bilateral lower extremity ABI.  Abnormal   Consults: ID VVS  Subjective: Patient in bed, appears comfortable, denies any headache, no fever, no chest pain or pressure, no shortness of breath , no abdominal pain. No new focal weakness.   Objective: Vitals: Blood pressure 131/78, pulse 73, temperature 99 F (37.2 C), temperature source Oral, resp. rate 16, height 6' (1.829 m), weight 107.9 kg, SpO2 92%.   Exam:  Awake Alert, No new F.N deficits, Normal affect Delta.AT,PERRAL Supple Neck, No JVD,   Symmetrical Chest wall movement, Good air movement bilaterally, few rales RRR,No Gallops, Rubs or new Murmurs,  +ve B.Sounds, Abd Soft, No tenderness,   Right lower extremity intense cellulitis , no signs of compartment syndrome, left knee is examined stable.  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 Being seen by VVS, ID and Orthopedics Dr. Lajoyce Corners Was on combination of IV penicillin and Zyvox, remains on IV penicillin G, Zyvox was stopped by ID on 06/21/2023. TTE negative for vegetation, lateral lower extremity venous duplex unremarkable, ABIs abnormal bilaterally, repeat blood cultures negative thus far. Requested patient to keep right lower extremity elevated with 3 pillows, nursing orders placed and staff updated, currently no signs of compartment syndrome.   PAD-s/p intravascular lithotripsy of above-knee popliteal artery/angioplasty of right popliteal artery/SFA on 3/3 Appreciate wound care consult Vascular surgery following-bilateral ABIs are abnormal  Some left knee pain.  Lower extremity venous duplex negative for DVT, x-ray shows chronic arthritis. Trial of colchicine and continue to monitor with supportive care.  Uric acid normal limits.  Hyponatremia.  Mixed picture, urine sodium under 10 but osmolality much higher than serum, received a liter and a half of IV fluids on 06/21/2023 with worsening sodium, case discussed with nephrology Dr. Signe Colt.  Fluid restriction and trial of Lasix and monitor.  ? Mild dysphagia - SLP eval  Persistent atrial fibrillation Telemetry monitoring Rate controlled with oral beta-blocker On Librexa AF trial drug (comparing Eliquis versus milvexian - factor XI inhibitor)  HTN BP stable-continue metoprolol Hold HCTZ for now  HLD Statin  New diagnosis of DM-2 (A1c 6.4 on 4/4)  SSI for now Metformin on discharge.  Recent Labs    06/22/23 1636 06/22/23 2014 06/23/23 0849  GLUCAP 129* 150* 172*     BMI Estimated body mass index  is 32.26 kg/m as calculated from the following:   Height as of this encounter: 6' (1.829 m).   Weight as of this encounter: 107.9 kg.   Code status:   Code Status: Full Code   DVT Prophylaxis: STUDY - LIBREXIA-AF - apixaban 5 mg or placebo capsule (PI-Sethi)    Family Communication:   Spouse, son Lorin Picket, daughter bedside on 06/20/2023 in detail.    Called and updated son Lorin Picket [phone] in detail on 06/21/2023 over the phone, 06/22/23, 06/23/23   Disposition Plan: Status is: Inpatient Remains inpatient appropriate because: Severity of illness   Planned Discharge Destination:CIR   Diet: Diet Order             Diet Heart Room service appropriate? Yes; Fluid consistency: Thin; Fluid restriction: 1200 mL Fluid  Diet effective now                    MEDICATIONS: Scheduled Meds:  clopidogrel  75 mg Oral Daily   colchicine  0.6 mg Oral Daily   docusate sodium  100 mg Oral BID   ezetimibe  10 mg Oral Daily   finasteride  5 mg Oral Daily   insulin aspart  0-9 Units Subcutaneous TID WC   metoprolol succinate  25 mg Oral Q12H   mupirocin ointment   Topical Daily   nutrition supplement (JUVEN)  1 packet Oral BID BM   pantoprazole  40 mg Oral Q1200   polyethylene glycol  17 g Oral Daily   potassium chloride  20 mEq Oral Once   rosuvastatin  20 mg Oral Daily   LIBREXIA-AF apixaban or placebo  5 mg Oral BID   LIBREXIA-AF [id number] (Milvexian) or placebo  1 tablet Oral BID   Continuous Infusions:  penicillin G potassium 9 Million Units in dextrose 5 % 500 mL CONTINUOUS infusion 9 Million Units (06/22/23 2237)   PRN Meds:.acetaminophen **OR** acetaminophen, guaiFENesin-dextromethorphan, morphine injection, ondansetron **OR** ondansetron (ZOFRAN) IV, mouth rinse, oxyCODONE, traMADol   I have personally reviewed following labs and imaging studies  LABORATORY DATA:  Recent Labs  Lab 06/19/23 0558 06/20/23 0551 06/21/23 0431 06/22/23 0433 06/23/23 0431  WBC 18.5*  19.1* 15.9* 12.0* 12.9*  HGB 11.4* 11.7* 11.5* 11.5* 11.3*  HCT 34.5* 36.0* 34.4* 34.1* 33.5*  PLT 191 227 230 260 258  MCV 86.3 86.5 85.8 84.6 84.0  MCH 28.5 28.1 28.7 28.5 28.3  MCHC 33.0 32.5 33.4 33.7 33.7  RDW 15.4 15.4 15.2 15.1 15.3  LYMPHSABS 1.3 1.3 1.2 1.1 1.2  MONOABS 1.1* 1.6* 1.3* 0.8 0.9  EOSABS 0.1 0.0 0.0 0.1 0.1  BASOSABS 0.1 0.1 0.0 0.0 0.0    Recent Labs  Lab 06/17/23 0430 06/17/23 0813 06/17/23 1414 06/18/23 0449 06/19/23 0558 06/20/23 0551 06/21/23 0431 06/22/23 0433 06/23/23 0431  NA 134*  --   --  135 132* 130* 127* 125* 127*  K 3.7  --   --  3.7 3.5 4.0 3.9 3.8 3.5  CL 100  --   --  99 98 95* 93* 90* 91*  CO2 22  --   --  23 23 24 22 22 25   ANIONGAP 12  --   --  13 11 11 12 13 11   GLUCOSE 134*  --   --  135* 129* 137* 129* 130*  129*  BUN 26*  --   --  20 15 19 23 23  25*  CREATININE 1.42*  --   --  1.21 1.03 1.19 1.07 1.01 1.03  AST 29  --   --  34  --   --   --   --   --   ALT 17  --   --  19  --   --   --   --   --   ALKPHOS 56  --   --  56  --   --   --   --   --   BILITOT 1.3*  --   --  1.5*  --   --   --   --   --   ALBUMIN 3.4*  --   --  2.4*  --   --   --   --   --   CRP  --   --   --   --   --  25.8* 27.6* 27.5* 26.9*  PROCALCITON  --   --   --   --  3.88 2.77 1.50 1.09  --   LATICACIDVEN 2.8* 2.4*  --   --   --   --   --   --   --   INR 2.0*  --   --  1.6*  --   --   --   --   --   HGBA1C  --   --  6.4*  --   --   --   --   --   --   BNP  --   --   --   --   --  475.0* 546.8* 469.2* 621.9*  MG  --   --   --   --   --  1.7 2.0 2.0 2.0  CALCIUM 8.6*  --   --  8.3* 8.1* 8.2* 7.8* 7.9* 7.8*      Recent Labs  Lab 06/17/23 0430 06/17/23 0813 06/17/23 1414 06/18/23 0449 06/19/23 0558 06/20/23 0551 06/21/23 0431 06/22/23 0433 06/23/23 0431  CRP  --   --   --   --   --  25.8* 27.6* 27.5* 26.9*  PROCALCITON  --   --   --   --  3.88 2.77 1.50 1.09  --   LATICACIDVEN 2.8* 2.4*  --   --   --   --   --   --   --   INR 2.0*  --   --   1.6*  --   --   --   --   --   HGBA1C  --   --  6.4*  --   --   --   --   --   --   BNP  --   --   --   --   --  475.0* 546.8* 469.2* 621.9*  MG  --   --   --   --   --  1.7 2.0 2.0 2.0  CALCIUM 8.6*  --   --  8.3* 8.1* 8.2* 7.8* 7.9* 7.8*    --------------------------------------------------------------------------------------------------------------- Lab Results  Component Value Date   CHOL 114 07/14/2019   HDL 50 07/14/2019   LDLCALC 45 07/14/2019   TRIG 101 07/14/2019   CHOLHDL 2.3 07/14/2019    Lab Results  Component Value Date   HGBA1C 6.4 (H) 06/17/2023      MICROBIOLOGY: Recent Results (from the past 240 hours)  Blood Culture (routine x 2)     Status: Abnormal   Collection Time: 06/17/23  4:03 AM   Specimen: BLOOD RIGHT ARM  Result Value Ref Range Status   Specimen Description   Final    BLOOD RIGHT ARM Performed at [facility], [address] [place], [place] [address]    Special Requests   Final    BOTTLES DRAWN AEROBIC AND ANAEROBIC Blood Culture adequate volume Performed at [facility], [address] [place], [place] [address]    Culture  Setup Time   Final    GRAM POSITIVE COCCI IN CHAINS ANAEROBIC BOTTLE ONLY CRITICAL RESULT CALLED TO, READ BACK BY AND VERIFIED WITH: PHARMD G ABBOTT 06/18/2023 @ 0042 BY AB    Culture (A)  Final    GROUP A STREP (S.PYOGENES) ISOLATED HEALTH DEPARTMENT NOTIFIED Performed at [facility], [address], [place], [place] [address]    Report Status 06/20/2023 FINAL  Final   Organism ID, Bacteria GROUP A STREP (S.PYOGENES) ISOLATED  Final      Susceptibility   Group a strep (s.pyogenes) isolated - MIC*    PENICILLIN <=0.06 SENSITIVE Sensitive     CEFTRIAXONE <=0.12 SENSITIVE Sensitive     ERYTHROMYCIN 2 RESISTANT Resistant     LEVOFLOXACIN 0.5 SENSITIVE Sensitive     VANCOMYCIN 0.5 SENSITIVE Sensitive     * GROUP A STREP (S.PYOGENES) ISOLATED  Blood Culture ID Panel  (Reflexed)     Status: Abnormal   Collection Time: 06/17/23  4:03 AM  Result Value Ref Range Status   Enterococcus faecalis NOT DETECTED NOT DETECTED Final   Enterococcus Faecium NOT DETECTED NOT DETECTED Final   Listeria monocytogenes NOT DETECTED NOT DETECTED Final   Staphylococcus species NOT DETECTED NOT DETECTED Final   Staphylococcus aureus (BCID) NOT DETECTED NOT DETECTED Final   Staphylococcus epidermidis NOT DETECTED NOT DETECTED Final   Staphylococcus lugdunensis NOT DETECTED NOT DETECTED Final   Streptococcus species DETECTED (A) NOT DETECTED Final    Comment: CRITICAL RESULT CALLED TO, READ BACK BY AND VERIFIED WITH: PHARMD G ABBOTT 06/18/2023 @ 0042 BY AB    Streptococcus agalactiae NOT DETECTED NOT DETECTED Final   Streptococcus pneumoniae NOT DETECTED NOT DETECTED Final   Streptococcus pyogenes DETECTED (A) NOT DETECTED Final    Comment: CRITICAL RESULT CALLED TO, READ BACK BY AND VERIFIED WITH: PHARMD G ABBOTT 06/18/2023 @ 0042 BY AB    A.calcoaceticus-baumannii NOT DETECTED NOT DETECTED Final   Bacteroides fragilis NOT DETECTED NOT DETECTED Final   Enterobacterales NOT DETECTED NOT DETECTED Final   Enterobacter cloacae complex NOT DETECTED NOT DETECTED Final   Escherichia coli NOT DETECTED NOT DETECTED Final   Klebsiella aerogenes NOT DETECTED NOT DETECTED Final   Klebsiella oxytoca NOT DETECTED NOT DETECTED Final   Klebsiella pneumoniae NOT DETECTED NOT DETECTED Final   Proteus species NOT DETECTED NOT DETECTED Final   Salmonella species NOT DETECTED NOT DETECTED Final   Serratia marcescens NOT DETECTED NOT DETECTED Final   Haemophilus influenzae NOT DETECTED NOT DETECTED Final   Neisseria meningitidis NOT DETECTED NOT DETECTED Final   Pseudomonas aeruginosa NOT DETECTED NOT DETECTED Final   Stenotrophomonas maltophilia NOT DETECTED NOT DETECTED Final   Candida albicans NOT DETECTED NOT DETECTED Final   Candida auris NOT DETECTED NOT DETECTED Final   Candida  glabrata NOT DETECTED NOT DETECTED Final   Candida krusei NOT DETECTED NOT DETECTED Final   Candida parapsilosis NOT DETECTED NOT DETECTED Final   Candida tropicalis  NOT DETECTED NOT DETECTED Final   Cryptococcus neoformans/gattii NOT DETECTED NOT DETECTED Final    Comment: Performed at [facility], [address], [place], [place] [address]  Blood Culture (routine x 2)     Status: Abnormal   Collection Time: 06/17/23  4:08 AM   Specimen: BLOOD RIGHT HAND  Result Value Ref Range Status   Specimen Description   Final    BLOOD RIGHT HAND Performed at [facility], [address], [place], [place] [address]    Special Requests   Final    BOTTLES DRAWN AEROBIC AND ANAEROBIC Blood Culture adequate volume Performed at [facility], [address], [place], [place] [address]    Culture  Setup Time   Final    GRAM POSITIVE COCCI IN PAIRS IN CHAINS AEROBIC BOTTLE ONLY CRITICAL VALUE NOTED.  VALUE IS CONSISTENT WITH PREVIOUSLY REPORTED AND CALLED VALUE.    Culture (A)  Final    GROUP A STREP (S.PYOGENES) ISOLATED SUSCEPTIBILITIES PERFORMED ON PREVIOUS CULTURE WITHIN THE LAST 5 DAYS. Performed at [facility], [address], [place], [place] [address]    Report Status 06/20/2023 FINAL  Final  Culture, blood (Routine X 2) w Reflex to ID Panel     Status: None   Collection Time: 06/18/23  6:10 PM   Specimen: BLOOD  Result Value Ref Range Status   Specimen Description BLOOD SITE NOT SPECIFIED  Final   Special Requests   Final    BOTTLES DRAWN AEROBIC AND ANAEROBIC Blood Culture adequate volume   Culture   Final    NO GROWTH 5 DAYS Performed at [facility], [address], [place], [place] [address]    Report Status 06/23/2023 FINAL  Final  Culture, blood (Routine X 2) w Reflex to ID Panel     Status: None   Collection Time: 06/18/23  6:12 PM   Specimen: BLOOD  Result Value Ref Range Status   Specimen Description BLOOD SITE  NOT SPECIFIED  Final   Special Requests   Final    BOTTLES DRAWN AEROBIC AND ANAEROBIC Blood Culture results may not be optimal due to an inadequate volume of blood received in culture bottles   Culture   Final    NO GROWTH 5 DAYS Performed at [facility], [address], [place], [place] [address]    Report Status 06/23/2023 FINAL  Final  Culture, blood (Routine X 2) w Reflex to ID Panel     Status: None (Preliminary result)   Collection Time: 06/19/23  5:56 AM   Specimen: BLOOD  Result Value Ref Range Status   Specimen Description BLOOD BLOOD LEFT HAND  Final   Special Requests   Final    BOTTLES DRAWN AEROBIC AND ANAEROBIC Blood Culture results may not be optimal due to an inadequate volume of blood received in culture bottles   Culture   Final    NO GROWTH 4 DAYS Performed at [facility], [address], [place], [place] [address]    Report Status PENDING  Incomplete  Culture, blood (Routine X 2) w Reflex to ID Panel     Status: None (Preliminary result)   Collection Time: 06/19/23  5:57 AM   Specimen: BLOOD  Result Value Ref Range Status   Specimen Description BLOOD BLOOD LEFT ARM  Final   Special Requests   Final    BOTTLES DRAWN AEROBIC AND ANAEROBIC Blood Culture results may not be optimal due  to an inadequate volume of blood received in culture bottles   Culture   Final    NO GROWTH 4 DAYS Performed at [facility], [address], [place], [place] [address]    Report Status PENDING  Incomplete    RADIOLOGY STUDIES/RESULTS: DG Chest Port 1 View Result Date: 06/22/2023 CLINICAL DATA:  Shortness of breath. EXAM: PORTABLE CHEST 1 VIEW COMPARISON:  June 17, 2023. FINDINGS: Stable cardiomegaly. Hypoinflation of the lungs with minimal bibasilar subsegmental atelectasis. Bony thorax is unremarkable. IMPRESSION: Hypoinflation of the lungs with minimal bibasilar subsegmental atelectasis. Electronically Signed   By: Lupita Raider M.D.   On: 06/22/2023 09:56      LOS: 6 days   Signature  -    Susa Raring M.D on 06/23/2023 at 8:52 AM   -  To page go to [email or web address]

## 2023-06-23 NOTE — Progress Notes (Signed)
 Inpatient Rehabilitation Admissions Coordinator   I met with patient at bedside and with his permission contacted his son, Logan French, by phone. They are in agreement to admit to CIR . Dr Thedore Mins feels will be medically ready Friday. I will make the preparations to admit.  Ottie Glazier, RN, MSN Rehab Admissions Coordinator [phone] 06/23/2023 11:47 AM

## 2023-06-23 NOTE — PMR Pre-admission (Signed)
 PMR Admission Coordinator Pre-Admission Assessment  Patient: Logan French is an 89 y.o., male MRN: [id number] DOB: September 23, 1934 Height: 6' (182.9 cm) Weight: 107.9 kg  Insurance Information HMO:     PPO:      PCP:      IPA:      80/20:      OTHER:  PRIMARY: Medicare a and b      Policy#: [id number]      Subscriber: pt Benefits:  Phone #: passport one source online     Name: 4/9 Eff. Date: a 09/14/2002 and b 08/15/2011     Deduct: $1676      Out of Pocket Max: none      Life Max: none CIR: 100%      SNF: 20 full days Outpatient: 80%     Co-Pay: 20% Home Health: 100%      Co-Pay: none DME: 80%     Co-Pay: 20% Providers: in network  SECONDARY: BCBS supplement      Policy#: [id number]  Financial Counselor:       Phone#:   The "Data Collection Information Summary" for patients in Inpatient Rehabilitation Facilities with attached "Privacy Act Statement-Health Care Records" was provided and verbally reviewed with: Patient and Family  Emergency Contact Information Contact Information     Name Relation Home Work Mobile   [place] S Spouse [phone]     Jovita Kussmaul   [phone]      Other Contacts   None on File    Current Medical History  Patient Admitting Diagnosis: Debility  History of Present Illness: 89 yo male with history of peripheral[heral vascular disease s/p angioplasty of the right popliteal artery , essential HTN, HLD, BPH,  B12 deficiency, presented at [facility]  on 06/17/23 for right leg cellulitis and wound.  Given 3 liters LR bolus, Rocephin and cultured for sepsis protocol. Transferred to [facility] on 06/17/23.  Found to have sepsis secondary to RLE cellulitis with streptococcal bacteremia. History of cat scratch with underlying DM neuropathy.  CT right fibula/tibia with extensive subcutaneous edema, no fluid collections, edematous changes appearing superficial to muscular fascia. 2 d echo EF 50 % with no obvious vegetation. VVS and Ortho  following. Was placed by ID consult on IV PCN and Zyvox , Zyvox stopped on 4/7. TTE negative for vegetation, ABI's abnormal bilaterally. Left knee negative for DVT xray shows chronic arthritis. Trial of colchicine. Uric acid normal. Hyponatremia treated with IVF'S, then fluid restriction and trial of Lasix.On Librexa AF trail drug for atrial fibrillation. Hold HCTZ for HTN and continue metoprolol. New DX DM 2 with Hgb A1c 6.4. SSI.   Patient's medical record from [facility] has been reviewed by the rehabilitation admission coordinator and physician.  Past Medical History  Past Medical History:  Diagnosis Date   A-fib (HCC)    B12 deficiency    Diverticulosis    Fatigue    History of colonic polyps    Hyperlipemia    Hypertension    Pernicious anemia    Prostate cancer (HCC)    Syncope    Syncope    Has the patient had major surgery during 100 days prior to admission? Yes  Family History   family history is not on file.  Current Medications  Current Facility-Administered Medications:    acetaminophen (TYLENOL) tablet 650 mg, 650 mg, Oral, Q6H PRN, 650 mg at 06/17/23 2307 **OR** acetaminophen (TYLENOL) suppository 650 mg, 650 mg, Rectal, Q6H PRN, Gertha Calkin, MD  amoxicillin (AMOXIL) capsule 1,000 mg, 1,000 mg, Oral, Q8H, Vu, Trung T, MD, 1,000 mg at 06/23/23 1238   clopidogrel (PLAVIX) tablet 75 mg, 75 mg, Oral, Daily, Ghimire, Werner Lean, MD, 75 mg at 06/23/23 1610   colchicine tablet 0.6 mg, 0.6 mg, Oral, Daily, Susa Raring K, MD, 0.6 mg at 06/23/23 9604   diclofenac Sodium (VOLTAREN) 1 % topical gel 2 g, 2 g, Topical, QID, Susa Raring K, MD, 2 g at 06/23/23 1012   docusate sodium (COLACE) capsule 100 mg, 100 mg, Oral, BID, Irena Cords V, MD, 100 mg at 06/23/23 5409   ezetimibe (ZETIA) tablet 10 mg, 10 mg, Oral, Daily, Irena Cords V, MD, 10 mg at 06/23/23 0925   finasteride (PROSCAR) tablet 5 mg, 5 mg, Oral, Daily, Reome, Earle J, RPH, 5 mg at 06/23/23 0924    guaiFENesin-dextromethorphan (ROBITUSSIN DM) 100-10 MG/5ML syrup 5 mL, 5 mL, Oral, Q4H PRN, Leroy Sea, MD, 5 mL at 06/21/23 1620   insulin aspart (novoLOG) injection 0-9 Units, 0-9 Units, Subcutaneous, TID WC, Ghimire, Werner Lean, MD, 1 Units at 06/23/23 1251   metoprolol succinate (TOPROL-XL) 24 hr tablet 25 mg, 25 mg, Oral, Q12H, Irena Cords V, MD, 25 mg at 06/23/23 8119   morphine (PF) 2 MG/ML injection 1 mg, 1 mg, Intravenous, Q4H PRN, Maretta Bees, MD, 1 mg at 06/21/23 1621   mupirocin ointment (BACTROBAN) 2 %, , Topical, Daily, Ghimire, Werner Lean, MD, Given at 06/23/23 1478   nutrition supplement (JUVEN) (JUVEN) powder packet 1 packet, 1 packet, Oral, BID BM, Nadara Mustard, MD, 1 packet at 06/23/23 0924   ondansetron (ZOFRAN) tablet 4 mg, 4 mg, Oral, Q6H PRN **OR** ondansetron (ZOFRAN) injection 4 mg, 4 mg, Intravenous, Q6H PRN, Gertha Calkin, MD   Oral care mouth rinse, 15 mL, Mouth Rinse, PRN, Ghimire, Werner Lean, MD   oxyCODONE (Oxy IR/ROXICODONE) immediate release tablet 5 mg, 5 mg, Oral, Q8H PRN, Leroy Sea, MD, 5 mg at 06/23/23 0655   pantoprazole (PROTONIX) EC tablet 40 mg, 40 mg, Oral, Q1200, Ghimire, Shanker M, MD, 40 mg at 06/23/23 1238   polyethylene glycol (MIRALAX / GLYCOLAX) packet 17 g, 17 g, Oral, Daily, Ghimire, Shanker M, MD, 17 g at 06/23/23 2956   potassium chloride SA (KLOR-CON M) CR tablet 20 mEq, 20 mEq, Oral, Once, Susa Raring K, MD   rosuvastatin (CRESTOR) tablet 20 mg, 20 mg, Oral, Daily, Irena Cords V, MD, 20 mg at 06/23/23 2130   STUDY - LIBREXIA-AF - apixaban 5 mg or placebo capsule (PI-Sethi), 5 mg, Oral, BID, Opyd, Lavone Neri, MD, 5 mg at 06/23/23 0924   STUDY - LIBREXIA-AF - [id number] (Milvexian) 100 mg or placebo tablet (PI-Sethi), 1 tablet, Oral, BID, Opyd, Lavone Neri, MD, 1 tablet at 06/23/23 0924   traMADol (ULTRAM) tablet 50 mg, 50 mg, Oral, Q12H PRN, Leroy Sea, MD, 50 mg at 06/23/23 1256  Patients Current Diet:  Diet Order              Diet Heart Room service appropriate? Yes; Fluid consistency: Thin; Fluid restriction: 1200 mL Fluid  Diet effective now                  Precautions / Restrictions Precautions Precautions: Fall Other Brace: PRAFO RLE Restrictions Weight Bearing Restrictions Per Provider Order: No   Has the patient had 2 or more falls or a fall with injury in the past year? No  Prior Activity Level Community (5-7x/wk): working  part time as Recruitment consultant, drive, no AD  Prior Functional Level Self Care: Did the patient need help bathing, dressing, using the toilet or eating? Independent  Indoor Mobility: Did the patient need assistance with walking from room to room (with or without device)? Independent  Stairs: Did the patient need assistance with internal or external stairs (with or without device)? Independent  Functional Cognition: Did the patient need help planning regular tasks such as shopping or remembering to take medications? Independent  Patient Information Are you of Hispanic, Latino/a,or Spanish origin?: A. No, not of Hispanic, Latino/a, or Spanish origin What is your race?: A. White Do you need or want an interpreter to communicate with a doctor or health care staff?: 0. No  Patient's Response To:  Health Literacy and Transportation Is the patient able to respond to health literacy and transportation needs?: Yes Health Literacy - How often do you need to have someone help you when you read instructions, pamphlets, or other written material from your doctor or pharmacy?: Never In the past 12 months, has lack of transportation kept you from medical appointments or from getting medications?: No In the past 12 months, has lack of transportation kept you from meetings, work, or from getting things needed for daily living?: No  Home Assistive Devices / Equipment Home Equipment: Gilmer Mor - single point  Prior Device Use: Indicate devices/aids used by the patient prior to  current illness, exacerbation or injury? None of the above  Current Functional Level Cognition  Orientation Level: Oriented X4    Extremity Assessment (includes Sensation/Coordination)  Upper Extremity Assessment: Overall WFL for tasks assessed, Right hand dominant  Lower Extremity Assessment: Defer to PT evaluation RLE Deficits / Details: Edematous, erythematous    ADLs  Overall ADL's : Needs assistance/impaired Eating/Feeding: Independent Grooming: Set up, Sitting Upper Body Bathing: Set up, Sitting Lower Body Bathing: Maximal assistance, Sit to/from stand Lower Body Bathing Details (indicate cue type and reason): cleaning performed after bowel incontenance Upper Body Dressing : Set up, Sitting Upper Body Dressing Details (indicate cue type and reason): gown Lower Body Dressing: Total assistance, Bed level Lower Body Dressing Details (indicate cue type and reason): assisted for socks Toilet Transfer: Maximal assistance, +2 for physical assistance Toilet Transfer Details (indicate cue type and reason): simulated to recliner Toileting- Clothing Manipulation and Hygiene: Maximal assistance General ADL Comments: Emphasis on OOB attempts after noted functional decline over weekend due to pain in BLE    Mobility  Overal bed mobility: Needs Assistance Bed Mobility: Rolling, Supine to Sit Rolling: Mod assist Sidelying to sit: Min assist, Used rails Supine to sit: Mod assist, +2 for safety/equipment, +2 for physical assistance, HOB elevated, Used rails Sit to supine: +2 for physical assistance, Max assist, Total assist, +2 for safety/equipment General bed mobility comments: Pt is able to scoot to [facility] with bed in trendelenburg position & using BUE on bed rail to pull himself up in bed.    Transfers  Overall transfer level: Needs assistance Equipment used: Rolling walker (2 wheels) Transfers: Sit to/from Stand, Bed to chair/wheelchair/BSC Sit to Stand: +2 physical assistance, +2  safety/equipment, Max assist, From elevated surface Bed to/from chair/wheelchair/BSC transfer type:: Step pivot Step pivot transfers: Max assist, +2 physical assistance, +2 safety/equipment, From elevated surface General transfer comment: Attempted STS from significantly elevated EOB with max assist +2 but pt unable to even clear buttocks from EOB. Pt also with limited ability to place BLE underneath BOS 2/2 decreased ROM & pain. Cuing for hand  placement but pt with BUE on RW during STS attempt.    Ambulation / Gait / Stairs / Wheelchair Mobility  Ambulation/Gait General Gait Details: unable at this time.    Posture / Balance Dynamic Sitting Balance Sitting balance - Comments: CGA static sitting EOB. Balance Overall balance assessment: Needs assistance Sitting-balance support: Feet supported, Bilateral upper extremity supported, No upper extremity supported Sitting balance-Leahy Scale: Fair Sitting balance - Comments: CGA static sitting EOB. Standing balance support: Bilateral upper extremity supported, Reliant on assistive device for balance Standing balance-Leahy Scale: Zero Standing balance comment: BUE support when standing    Special needs/care consideration attends to [facility] as OP New DM 2 dx Hgb A1c 6.2           Bullins, Benard Halsted, RN  Registered Nurse WOC   Consult Note    Signed   Date of Service: 06/18/2023  6:41 AM   Signed      Show:Clear all [x] Written[x] Templated[] Copied  Added by: [x] Bullins, Benard Halsted, RN  [] Hover for details WOC Nurse Consult Note: patient followed at [facility] for R leg wound r/t venous insufficiency; has been admitted for cellulitis of this leg; last visit at [facility] patient switched to Bactroban ointment and  Prisma (a collagen dressing not on formulary at [facility]); will use Aquacel AG as substitute  Reason for Consult: R lower leg wound  Wound type: full thickness r/t venous insufficiency  Pressure Injury POA: NA   Measurement: see nursing flowsheet  Wound bed:75% tan yellow 25% pink  Drainage (amount, consistency, odor) see nursing flowsheet  Periwound: edema, erythema, hyperkeratotic skin  Dressing procedure/placement/frequency: Cleanse R lower leg (intact skin and wound) with Vashe wound cleanser, do not rinse and allow to air dry.  Apply Bactroban to wound bed daily, cover with silver hydrofiber [place] [phone]) cut to fit wound bed, cover with dry gauze/ABD pad.  Secure dressing with Kerlix roll gauze beginning right above toes and ending right below knee.  Apply Ace bandage wrapped in same fashion as Kerlix for light compression. Patient should elevate leg as much as possible.    POC discussed with bedside nurse. WOC team will not follow. Re-consult if further needs arise.    Thank you,      Priscella Mann MSN, RN-BC, [employer] [phone]                     Previous Home Environment  Living Arrangements: Spouse/significant other  Lives With: Spouse Available Help at Discharge: Family, Available 24 hours/day Type of Home: House Home Layout: Multi-level, Able to live on main level with bedroom/bathroom Alternate Level Stairs-Number of Steps: 2 steps down into kitchen/living room. main level has bedroom/bathroom and an upstairs that pt does not have to use Home Access: Stairs to enter [employer] of Steps: "just a couple" Home Care Services: No  Discharge Living Setting Plans for Discharge Living Setting: Patient's home, Lives with (comment) (spouse) Type of Home at Discharge: House Discharge Home Layout: Multi-level, Able to live on main level with bedroom/bathroom Alternate Level Stairs-Number of Steps: 2 steps down into living and kitchen area Discharge Home Access: Stairs to enter Does the patient have any problems obtaining your medications?: No  Social/Family/Support Systems Patient Roles: Spouse, 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  Information: son, Lorin Picket and wife, Harriett Sine Anticipated Caregiver: wife Anticipated Industrial/product designer Information: see contacts Ability/Limitations of Caregiver: son in [place], wife without AD Caregiver Availability: 24/7 Discharge Plan Discussed with Primary  Caregiver: Yes Is Caregiver In Agreement with Plan?: Yes Does Caregiver/Family have Issues with Lodging/Transportation while Pt is in Rehab?: No  Goals Patient/Family Goal for Rehab: Mod I to supervision with PT and OT Expected length of stay: ELOS 12 to 20 days Pt/Family Agrees to Admission and willing to participate: Yes Program Orientation Provided & Reviewed with Pt/Caregiver Including Roles  & Responsibilities: Yes  Decrease burden of Care through IP rehab admission: n/a  Possible need for SNF placement upon discharge: IF does not reach level of supervision to min to d/c directly home  Patient Condition: I have reviewed medical records from [facility], spoken with CM, and patient and son. I met with patient at the bedside and discussed via phone for inpatient rehabilitation assessment.  Patient will benefit from ongoing PT and OT, can actively participate in 3 hours of therapy a day 5 days of the week, and can make measurable gains during the admission.  Patient will also benefit from the coordinated team approach during an Inpatient Acute Rehabilitation admission.  The patient will receive intensive therapy as well as Rehabilitation physician, nursing, social worker, and care management interventions.  Due to bladder management, bowel management, safety, skin/wound care, disease management, medication administration, pain management, and patient education the patient requires 24 hour a day rehabilitation nursing.  The patient is currently *** with mobility and basic ADLs.  Discharge setting and therapy post discharge at home with home health is anticipated.  Patient has agreed to participate in the Acute Inpatient Rehabilitation  Program and will admit {Time; today/tomorrow:10263}.  Preadmission Screen Completed By:  Worthy Keeler MSN 06/23/2023 3:18 PM ______________________________________________________________________   Discussed status with Dr. Marland Kitchen on *** at *** and received approval for admission today.  Admission Coordinator:  Clois Dupes, RN MSN time Marland KitchenDorna Bloom ***   Assessment/Plan: Diagnosis: *** Does the need for close, 24 hr/day Medical supervision in concert with the patient's rehab needs make it unreasonable for this patient to be served in a less intensive setting? {yes_no_potentially:3041433} Co-Morbidities requiring supervision/potential complications: *** Due to {due WU:1324401}, does the patient require 24 hr/day rehab nursing? {yes_no_potentially:3041433} Does the patient require coordinated care of a physician, rehab nurse, PT, OT, and SLP to address physical and functional deficits in the context of the above medical diagnosis(es)? {yes_no_potentially:3041433} Addressing deficits in the following areas: {deficits:3041436} Can the patient actively participate in an intensive therapy program of at least 3 hrs of therapy 5 days a week? {yes_no_potentially:3041433} The potential for patient to make measurable gains while on inpatient rehab is {potential:3041437} Anticipated functional outcomes upon discharge from inpatient rehab: {functional outcomes:304600100} PT, {functional outcomes:304600100} OT, {functional outcomes:304600100} SLP Estimated rehab length of stay to reach the above functional goals is: *** Anticipated discharge destination: {anticipated dc setting:21604} 10. Overall Rehab/Functional Prognosis: {potential:3041437}   MD Signature: ***

## 2023-06-23 NOTE — Progress Notes (Signed)
 Subjective  -   Complaining of bilateral leg pain and stiffness   Physical Exam:  Erythema on right leg appears to be improving. Wound was just washed with Vashe and redressed       Assessment/Plan:    Right ankle wound: The patient has undergone Doppler evaluation of his intervention by Dr. Hetty Blend 1 month ago which appears to be patent.  He is continue to get antibiotics per ID.  His leg pain persists.  He is being seen by physical therapy and consideration for CIR is being made.  Dr. Lajoyce Corners has seen the patient and is managing his wound.  Currently this will be washed with Vashe and dressed daily.  I spoke with Dr. Lajoyce Corners today.  Since it sounds like the patient is going to rehab, he will need to be followed while up there.  Logan French 06/23/2023 4:07 PM --  Vitals:   06/23/23 0849 06/23/23 1252  BP: (!) 110/96 (!) 144/73  Pulse: 73   Resp: (!) 24 20  Temp: 97.9 F (36.6 C) 97.8 F (36.6 C)  SpO2: 94% 94%    Intake/Output Summary (Last 24 hours) at 06/23/2023 1607 Last data filed at 06/23/2023 1148 Gross per 24 hour  Intake 1590.86 ml  Output 1850 ml  Net -259.14 ml     Laboratory CBC    Component Value Date/Time   WBC 12.9 (H) 06/23/2023 0431   HGB 11.3 (L) 06/23/2023 0431   HGB 13.4 09/08/2021 1027   HCT 33.5 (L) 06/23/2023 0431   HCT 41.2 09/08/2021 1027   PLT 258 06/23/2023 0431   PLT 270 09/08/2021 1027    BMET    Component Value Date/Time   NA 127 (L) 06/23/2023 0431   NA 142 09/08/2021 1027   K 3.5 06/23/2023 0431   CL 91 (L) 06/23/2023 0431   CO2 25 06/23/2023 0431   GLUCOSE 129 (H) 06/23/2023 0431   BUN 25 (H) 06/23/2023 0431   BUN 23 09/08/2021 1027   CREATININE 1.03 06/23/2023 0431   CALCIUM 7.8 (L) 06/23/2023 0431   GFRNONAA >60 06/23/2023 0431   GFRAA 67 07/14/2019 1020    COAG Lab Results  Component Value Date   INR 1.6 (H) 06/18/2023   INR 2.0 (H) 06/17/2023   INR 1.3 (H) 07/16/2022   No results found for:  "PTT"  Antibiotics Anti-infectives (From admission, onward)    Start     Dose/Rate Route Frequency Ordered Stop   06/23/23 1200  amoxicillin (AMOXIL) capsule 1,000 mg        1,000 mg Oral Every 8 hours 06/23/23 1120 07/02/23 0559   06/18/23 1000  penicillin G potassium 9 Million Units in dextrose 5 % 500 mL CONTINUOUS infusion  Status:  Discontinued        9 Million Units 41.7 mL/hr over 12 Hours Intravenous Every 12 hours 06/18/23 0848 06/23/23 1120   06/18/23 1000  linezolid (ZYVOX) IVPB 600 mg  Status:  Discontinued        600 mg 300 mL/hr over 60 Minutes Intravenous Every 12 hours 06/18/23 0857 06/21/23 1103   06/18/23 0600  cefTRIAXone (ROCEPHIN) 2 g in sodium chloride 0.9 % 100 mL IVPB  Status:  Discontinued        2 g 200 mL/hr over 30 Minutes Intravenous Every 24 hours 06/17/23 1435 06/18/23 0055   06/18/23 0600  penicillin G potassium 4 Million Units in dextrose 5 % 250 mL IVPB  Status:  Discontinued  4 Million Units 250 mL/hr over 60 Minutes Intravenous Every 6 hours 06/18/23 0055 06/18/23 0848   06/17/23 2200  metroNIDAZOLE (FLAGYL) IVPB 500 mg  Status:  Discontinued        500 mg 100 mL/hr over 60 Minutes Intravenous Every 12 hours 06/17/23 2030 06/18/23 0055   06/17/23 1500  metroNIDAZOLE (FLAGYL) IVPB 500 mg  Status:  Discontinued        500 mg 100 mL/hr over 60 Minutes Intravenous Every 12 hours 06/17/23 1401 06/17/23 1435   06/17/23 1500  ceFEPIme (MAXIPIME) 2 g in sodium chloride 0.9 % 100 mL IVPB  Status:  Discontinued        2 g 200 mL/hr over 30 Minutes Intravenous Every 12 hours 06/17/23 1404 06/17/23 1435   06/17/23 1500  vancomycin (VANCOREADY) IVPB 1750 mg/350 mL  Status:  Discontinued        1,750 mg 175 mL/hr over 120 Minutes Intravenous Every 24 hours 06/17/23 1406 06/18/23 0055   06/17/23 0415  cefTRIAXone (ROCEPHIN) 2 g in sodium chloride 0.9 % 100 mL IVPB        2 g 200 mL/hr over 30 Minutes Intravenous  Once 06/17/23 0404 06/17/23 0526         V. Charlena Cross, M.D., [facility] [place] Office: [phone] Pager:  [phone]

## 2023-06-23 NOTE — Plan of Care (Signed)
  Problem: Clinical Measurements: Goal: Ability to maintain clinical measurements within normal limits will improve Outcome: Progressing Goal: Will remain free from infection Outcome: Progressing Goal: Respiratory complications will improve Outcome: Progressing Goal: Cardiovascular complication will be avoided Outcome: Progressing   Problem: Nutrition: Goal: Adequate nutrition will be maintained Outcome: Progressing   Problem: Safety: Goal: Ability to remain free from injury will improve Outcome: Progressing   Problem: Skin Integrity: Goal: Risk for impaired skin integrity will decrease Outcome: Progressing

## 2023-06-23 NOTE — Plan of Care (Signed)

## 2023-06-24 DIAGNOSIS — L03115 Cellulitis of right lower limb: Secondary | ICD-10-CM | POA: Diagnosis not present

## 2023-06-24 DIAGNOSIS — R7881 Bacteremia: Secondary | ICD-10-CM | POA: Diagnosis not present

## 2023-06-24 DIAGNOSIS — B955 Unspecified streptococcus as the cause of diseases classified elsewhere: Secondary | ICD-10-CM | POA: Diagnosis not present

## 2023-06-24 LAB — BASIC METABOLIC PANEL WITH GFR
Anion gap: 12 (ref 5–15)
BUN: 22 mg/dL (ref 8–23)
CO2: 24 mmol/L (ref 22–32)
Calcium: 8 mg/dL — ABNORMAL LOW (ref 8.9–10.3)
Chloride: 93 mmol/L — ABNORMAL LOW (ref 98–111)
Creatinine, Ser: 0.94 mg/dL (ref 0.61–1.24)
GFR, Estimated: >60 mL/min (ref >60)
Glucose, Bld: 104 mg/dL — ABNORMAL HIGH (ref 70–99)
Potassium: 3.9 mmol/L (ref 3.5–5.1)
Sodium: 129 mmol/L — ABNORMAL LOW (ref 135–145)

## 2023-06-24 LAB — GLUCOSE, CAPILLARY
Glucose-Capillary: 101 mg/dL — ABNORMAL HIGH (ref 70–99)
Glucose-Capillary: 155 mg/dL — ABNORMAL HIGH (ref 70–99)
Glucose-Capillary: 124 mg/dL — ABNORMAL HIGH (ref 70–99)
Glucose-Capillary: 155 mg/dL — ABNORMAL HIGH (ref 70–99)

## 2023-06-24 LAB — CULTURE, BLOOD (ROUTINE X 2)
Culture: NO GROWTH
Culture: NO GROWTH

## 2023-06-24 MED ORDER — SODIUM CHLORIDE 0.9 % IV SOLN
12.5000 mg | Freq: Three times a day (TID) | INTRAVENOUS | Status: DC | PRN
  Administered 2023-06-25: 12.5 mg via INTRAVENOUS
  Filled 2023-06-24 (×2): qty 0.5

## 2023-06-24 MED ORDER — POTASSIUM CHLORIDE CRYS ER 20 MEQ PO TBCR
20.0000 meq | EXTENDED_RELEASE_TABLET | Freq: Once | ORAL | Status: AC
  Administered 2023-06-24: 20 meq via ORAL
  Filled 2023-06-24: qty 1

## 2023-06-24 MED ORDER — FUROSEMIDE 10 MG/ML IJ SOLN
20.0000 mg | Freq: Once | INTRAMUSCULAR | Status: AC
  Administered 2023-06-24: 20 mg via INTRAVENOUS
  Filled 2023-06-24: qty 2

## 2023-06-24 NOTE — Progress Notes (Signed)
 Patient ID: Logan French, male   DOB: [birth year], 89 y.o.   MRN: [id number] Discussed with patient wound care in office after discharge. Will try to enroll him in a venous leg ulcer study. Continue current care and see if different intervention needed in office.

## 2023-06-24 NOTE — Progress Notes (Signed)
 PROGRESS NOTE        PATIENT DETAILS Name: Logan French Age: 89 y.o. Sex: male Date of Birth: 07-Oct-1934 Admit Date: 06/17/2023 Admitting Physician Briscoe Deutscher, MD ZOX:[id number], Barry Dienes, MD  Brief Summary: Patient is a 88 y.o.  male with history of chronic right leg wound-followed by wound care-s/p recent PCI to popliteal artery-presented with severe RLE pain/swelling/erythema following a cat scratch around 3/31.  Patient was found to have sepsis secondary to RLE cellulitis with streptococcal bacteremia and admitted to the hospitalist service.  Significant events: 4/3>> admitted to [facility]  Significant studies: 4/3>> CT right fibula/tibia: Extensive subcutaneous edema-no fluid collections-edematous changes appears superficial to muscular fascia. 4/4>> echo: EF 50%-no obvious vegetation.   Significant microbiology data: 4/3>> blood culture: Group A strep 4/4>> blood culture: No growth  Procedures: Bilateral lower extremity venous duplex.  Unremarkable. Bilateral lower extremity ABI.  Abnormal   Consults: ID VVS  Subjective: Patient in bed, appears comfortable, denies any headache, no fever, no chest pain or pressure, no shortness of breath , no abdominal pain. No new focal weakness.   Objective: Vitals: Blood pressure 122/85, pulse 68, temperature 97.6 F (36.4 C), temperature source Axillary, resp. rate 18, height 6' (1.829 m), weight 103.4 kg, SpO2 91%.   Exam:  Awake Alert, No new F.N deficits, Normal affect [place].AT,PERRAL Supple Neck, No JVD,   Symmetrical Chest wall movement, Good air movement bilaterally, no rales RRR,No Gallops, Rubs or new Murmurs,  +ve B.Sounds, Abd Soft, No tenderness,   Right lower extremity intense cellulitis , no signs of compartment syndrome, left knee is examined stable.  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 Being seen by VVS, ID and Orthopedics Dr. Lajoyce Corners, DW Dr Lajoyce Corners 06/23/23, 06/24/23 Was on combination of IV penicillin and Zyvox,>> IV penicillin G, Zyvox was stopped by ID on 06/21/2023 & now switched to PO PCN with stop date 07/04/2023. TTE negative for vegetation, lateral lower extremity venous duplex unremarkable, ABIs abnormal bilaterally, repeat blood cultures negative thus far. Requested patient to keep right lower extremity elevated with 2 pillows, nursing orders placed and staff updated, currently no signs of compartment syndrome.  W care per Dr Lajoyce Corners - Apply 4 x 4 gauze dampened with Vashe to the right Achilles ulcer.  Apply an ace wrap from the metatarsal heads up to the tibial tubercle. Wear PRAFO boot R foot.    PAD-s/p intravascular lithotripsy of above-knee popliteal artery/angioplasty of right popliteal artery/SFA on 3/3 Appreciate wound care consult Vascular surgery following-bilateral ABIs are abnormal  Some left knee pain.  Lower extremity venous duplex negative for DVT, x-ray shows chronic arthritis. Trial of colchicine and continue to monitor with supportive care.  Uric acid normal limits.  Hyponatremia.  SIADH - fluid restrict and gentle Lasix, improving.  ? Mild dysphagia - SLP eval  Persistent atrial fibrillation Telemetry monitoring Rate controlled with oral beta-blocker On Librexa AF trial drug (comparing Eliquis versus milvexian - factor XI inhibitor)  HTN BP stable-continue metoprolol, off HCTZ    HLD Statin  New diagnosis of DM-2 (A1c 6.4 on 4/4) SSI for now Metformin on discharge.  Recent Labs    06/23/23 1248 06/23/23 1625  06/23/23 2118  GLUCAP 134* 110* 125*     BMI Estimated body mass index is 30.92 kg/m as calculated from the following:   Height as of this encounter: 6' (1.829 m).   Weight as of this encounter: 103.4 kg.   Code status:   Code Status: Full Code   DVT Prophylaxis: STUDY - LIBREXIA-AF - apixaban 5 mg or placebo capsule (PI-Sethi)    Family Communication:   Spouse, son Lorin Picket, daughter bedside on 06/20/2023 in detail.    Called and updated son Lorin Picket [phone] in detail on 06/21/2023 over the phone, 06/22/23, 06/23/23, 06/24/23   Disposition Plan:  Status is: Inpatient Remains inpatient appropriate because: Severity of illness   Planned Discharge Destination:CIR   Diet: Diet Order             Diet Heart Room service appropriate? Yes; Fluid consistency: Thin; Fluid restriction: 1200 mL Fluid  Diet effective now                    MEDICATIONS: Scheduled Meds:  amoxicillin  1,000 mg Oral Q8H   clopidogrel  75 mg Oral Daily   colchicine  0.6 mg Oral Daily   diclofenac Sodium  2 g Topical QID   docusate sodium  100 mg Oral BID   ezetimibe  10 mg Oral Daily   finasteride  5 mg Oral Daily   furosemide  20 mg Intravenous Once   insulin aspart  0-9 Units Subcutaneous TID WC   metoprolol succinate  25 mg Oral Q12H   mupirocin ointment   Topical Daily   nutrition supplement (JUVEN)  1 packet Oral BID BM   pantoprazole  40 mg Oral Q1200   polyethylene glycol  17 g Oral Daily   potassium chloride  20 mEq Oral Once   rosuvastatin  20 mg Oral Daily   LIBREXIA-AF apixaban or placebo  5 mg Oral BID   LIBREXIA-AF [id number] (Milvexian) or placebo  1 tablet Oral BID   Continuous Infusions:   PRN Meds:.acetaminophen **OR** acetaminophen, guaiFENesin-dextromethorphan, morphine injection, ondansetron **OR** ondansetron (ZOFRAN) IV, mouth rinse, oxyCODONE, traMADol   I have personally reviewed following labs and imaging studies  LABORATORY DATA:  Recent Labs  Lab  06/19/23 0558 06/20/23 0551 06/21/23 0431 06/22/23 0433 06/23/23 0431  WBC 18.5* 19.1* 15.9* 12.0* 12.9*  HGB 11.4* 11.7* 11.5* 11.5* 11.3*  HCT 34.5* 36.0* 34.4* 34.1* 33.5*  PLT 191 227 230 260 258  MCV 86.3 86.5 85.8 84.6 84.0  MCH 28.5 28.1 28.7 28.5 28.3  MCHC 33.0 32.5 33.4 33.7 33.7  RDW 15.4 15.4 15.2 15.1 15.3  LYMPHSABS 1.3 1.3 1.2 1.1 1.2  MONOABS 1.1* 1.6* 1.3* 0.8 0.9  EOSABS 0.1 0.0 0.0 0.1 0.1  BASOSABS 0.1 0.1 0.0 0.0 0.0    Recent Labs  Lab 06/17/23 0813 06/17/23 1414 06/18/23 0449 06/18/23 0449 06/19/23 0558 06/20/23 0551 06/21/23 0431 06/22/23 0433 06/23/23 0431 06/24/23 0442  NA  --   --  135   < > 132* 130* 127* 125* 127* 129*  K  --   --  3.7   < > 3.5 4.0 3.9 3.8 3.5 3.9  CL  --   --  99   < > 98 95* 93* 90* 91* 93*  CO2  --   --  23   < > 23 24 22 22 25 24   ANIONGAP  --   --  13   < > 11 11 12 13 11 12   GLUCOSE  --   --  135*   < > 129* 137* 129* 130* 129* 104*  BUN  --   --  20   < > 15 19 23 23  25* 22  CREATININE  --   --  1.21   < > 1.03 1.19 1.07 1.01 1.03 0.94  AST  --   --  34  --   --   --   --   --   --   --   ALT  --   --  19  --   --   --   --   --   --   --   ALKPHOS  --   --  56  --   --   --   --   --   --   --   BILITOT  --   --  1.5*  --   --   --   --   --   --   --   ALBUMIN  --   --  2.4*  --   --   --   --   --   --   --   CRP  --   --   --   --   --  25.8* 27.6* 27.5* 26.9*  --   PROCALCITON  --   --   --   --  3.88 2.77 1.50 1.09  --   --   LATICACIDVEN 2.4*  --   --   --   --   --   --   --   --   --   INR  --   --  1.6*  --   --   --   --   --   --   --   HGBA1C  --  6.4*  --   --   --   --   --   --   --   --   BNP  --   --   --   --   --  475.0* 546.8* 469.2* 621.9*  --   MG  --   --   --   --   --  1.7 2.0 2.0 2.0  --   CALCIUM  --   --  8.3*   < > 8.1* 8.2* 7.8* 7.9* 7.8* 8.0*   < > = values in this interval not displayed.      Recent Labs  Lab 06/17/23 0813 06/17/23 1414 06/18/23 0449 06/18/23 0449  06/19/23 0558 06/20/23 0551 06/21/23 0431 06/22/23 0433 06/23/23 0431 06/24/23 0442  CRP  --   --   --   --   --  25.8* 27.6* 27.5* 26.9*  --   PROCALCITON  --   --   --   --  3.88 2.77 1.50 1.09  --   --   LATICACIDVEN 2.4*  --   --   --   --   --   --   --   --   --   INR  --   --  1.6*  --   --   --   --   --   --   --   HGBA1C  --  6.4*  --   --   --   --   --   --   --   --  BNP  --   --   --   --   --  475.0* 546.8* 469.2* 621.9*  --   MG  --   --   --   --   --  1.7 2.0 2.0 2.0  --   CALCIUM  --   --  8.3*   < > 8.1* 8.2* 7.8* 7.9* 7.8* 8.0*   < > = values in this interval not displayed.    --------------------------------------------------------------------------------------------------------------- Lab Results  Component Value Date   CHOL 114 07/14/2019   HDL 50 07/14/2019   LDLCALC 45 07/14/2019   TRIG 101 07/14/2019   CHOLHDL 2.3 07/14/2019    Lab Results  Component Value Date   HGBA1C 6.4 (H) 06/17/2023      MICROBIOLOGY: Recent Results (from the past 240 hours)  Blood Culture (routine x 2)     Status: Abnormal   Collection Time: 06/17/23  4:03 AM   Specimen: BLOOD RIGHT ARM  Result Value Ref Range Status   Specimen Description   Final    BLOOD RIGHT ARM Performed at [facility], [address], [place], [place] [address]    Special Requests   Final    BOTTLES DRAWN AEROBIC AND ANAEROBIC Blood Culture adequate volume Performed at [facility], [address], [place], [place] [address]    Culture  Setup Time   Final    GRAM POSITIVE COCCI IN CHAINS ANAEROBIC BOTTLE ONLY CRITICAL RESULT CALLED TO, READ BACK BY AND VERIFIED WITH: PHARMD G ABBOTT 06/18/2023 @ 0042 BY AB    Culture (A)  Final    GROUP A STREP (S.PYOGENES) ISOLATED HEALTH DEPARTMENT NOTIFIED Performed at [facility], [address] [place], [place] [address]    Report Status 06/20/2023 FINAL  Final   Organism ID, Bacteria GROUP A STREP  (S.PYOGENES) ISOLATED  Final      Susceptibility   Group a strep (s.pyogenes) isolated - MIC*    PENICILLIN <=0.06 SENSITIVE Sensitive     CEFTRIAXONE <=0.12 SENSITIVE Sensitive     ERYTHROMYCIN 2 RESISTANT Resistant     LEVOFLOXACIN 0.5 SENSITIVE Sensitive     VANCOMYCIN 0.5 SENSITIVE Sensitive     * GROUP A STREP (S.PYOGENES) ISOLATED  Blood Culture ID Panel (Reflexed)     Status: Abnormal   Collection Time: 06/17/23  4:03 AM  Result Value Ref Range Status   Enterococcus faecalis NOT DETECTED NOT DETECTED Final   Enterococcus Faecium NOT DETECTED NOT DETECTED Final   Listeria monocytogenes NOT DETECTED NOT DETECTED Final   Staphylococcus species NOT DETECTED NOT DETECTED Final   Staphylococcus aureus (BCID) NOT DETECTED NOT DETECTED Final   Staphylococcus epidermidis NOT DETECTED NOT DETECTED Final   Staphylococcus lugdunensis NOT DETECTED NOT DETECTED Final   Streptococcus species DETECTED (A) NOT DETECTED Final    Comment: CRITICAL RESULT CALLED TO, READ BACK BY AND VERIFIED WITH: PHARMD G ABBOTT 06/18/2023 @ 0042 BY AB    Streptococcus agalactiae NOT DETECTED NOT DETECTED Final   Streptococcus pneumoniae NOT DETECTED NOT DETECTED Final   Streptococcus pyogenes DETECTED (A) NOT DETECTED Final    Comment: CRITICAL RESULT CALLED TO, READ BACK BY AND VERIFIED WITH: PHARMD G ABBOTT 06/18/2023 @ 0042 BY AB    A.calcoaceticus-baumannii NOT DETECTED NOT DETECTED Final   Bacteroides fragilis NOT DETECTED NOT DETECTED Final   Enterobacterales NOT DETECTED NOT DETECTED Final   Enterobacter cloacae complex NOT DETECTED NOT DETECTED Final   Escherichia coli NOT DETECTED NOT DETECTED  Final   Klebsiella aerogenes NOT DETECTED NOT DETECTED Final   Klebsiella oxytoca NOT DETECTED NOT DETECTED Final   Klebsiella pneumoniae NOT DETECTED NOT DETECTED Final   Proteus species NOT DETECTED NOT DETECTED Final   Salmonella species NOT DETECTED NOT DETECTED Final   Serratia marcescens NOT DETECTED NOT  DETECTED Final   Haemophilus influenzae NOT DETECTED NOT DETECTED Final   Neisseria meningitidis NOT DETECTED NOT DETECTED Final   Pseudomonas aeruginosa NOT DETECTED NOT DETECTED Final   Stenotrophomonas maltophilia NOT DETECTED NOT DETECTED Final   Candida albicans NOT DETECTED NOT DETECTED Final   Candida auris NOT DETECTED NOT DETECTED Final   Candida glabrata NOT DETECTED NOT DETECTED Final   Candida krusei NOT DETECTED NOT DETECTED Final   Candida parapsilosis NOT DETECTED NOT DETECTED Final   Candida tropicalis NOT DETECTED NOT DETECTED Final   Cryptococcus neoformans/gattii NOT DETECTED NOT DETECTED Final    Comment: Performed at [facility], [address], [place], [place] [address]  Blood Culture (routine x 2)     Status: Abnormal   Collection Time: 06/17/23  4:08 AM   Specimen: BLOOD RIGHT HAND  Result Value Ref Range Status   Specimen Description   Final    BLOOD RIGHT HAND Performed at [facility], [address] [place], [place] [address]    Special Requests   Final    BOTTLES DRAWN AEROBIC AND ANAEROBIC Blood Culture adequate volume Performed at [facility], [address] [place], [place] [address]    Culture  Setup Time   Final    GRAM POSITIVE COCCI IN PAIRS IN CHAINS AEROBIC BOTTLE ONLY CRITICAL VALUE NOTED.  VALUE IS CONSISTENT WITH PREVIOUSLY REPORTED AND CALLED VALUE.    Culture (A)  Final    GROUP A STREP (S.PYOGENES) ISOLATED SUSCEPTIBILITIES PERFORMED ON PREVIOUS CULTURE WITHIN THE LAST 5 DAYS. Performed at [facility], [address] [place], [place] [address]    Report Status 06/20/2023 FINAL  Final  Culture, blood (Routine X 2) w Reflex to ID Panel     Status: None   Collection Time: 06/18/23  6:10 PM   Specimen: BLOOD  Result Value Ref Range Status   Specimen Description BLOOD SITE NOT SPECIFIED  Final   Special Requests   Final    BOTTLES DRAWN AEROBIC AND ANAEROBIC Blood Culture  adequate volume   Culture   Final    NO GROWTH 5 DAYS Performed at [facility], [address], [place], [place] [address]    Report Status 06/23/2023 FINAL  Final  Culture, blood (Routine X 2) w Reflex to ID Panel     Status: None   Collection Time: 06/18/23  6:12 PM   Specimen: BLOOD  Result Value Ref Range Status   Specimen Description BLOOD SITE NOT SPECIFIED  Final   Special Requests   Final    BOTTLES DRAWN AEROBIC AND ANAEROBIC Blood Culture results may not be optimal due to an inadequate volume of blood received in culture bottles   Culture   Final    NO GROWTH 5 DAYS Performed at [facility], [address], [place], [place] [address]    Report Status 06/23/2023 FINAL  Final  Culture, blood (Routine X 2) w Reflex to ID Panel     Status: None   Collection Time: 06/19/23  5:56 AM   Specimen: BLOOD  Result Value Ref Range Status   Specimen Description BLOOD BLOOD LEFT HAND  Final   Special Requests   Final    BOTTLES DRAWN AEROBIC AND ANAEROBIC Blood Culture results may not be optimal due to an inadequate volume of blood received in culture bottles   Culture   Final    NO GROWTH 5 DAYS Performed at [facility], [address], [place], [place] [address]    Report Status 06/24/2023 FINAL  Final  Culture, blood (Routine X 2) w Reflex to ID Panel     Status: None   Collection Time: 06/19/23  5:57 AM   Specimen: BLOOD  Result Value Ref Range Status   Specimen Description BLOOD BLOOD LEFT ARM  Final   Special Requests   Final    BOTTLES DRAWN AEROBIC AND ANAEROBIC Blood Culture results may not be optimal due to an inadequate volume of blood received in culture bottles   Culture   Final    NO GROWTH 5 DAYS Performed at [facility], [address] [place], [place] [address]    Report Status 06/24/2023 FINAL  Final    RADIOLOGY STUDIES/RESULTS: DG Chest Port 1 View Result Date: 06/22/2023 CLINICAL DATA:  Shortness of breath. EXAM: PORTABLE CHEST  1 VIEW COMPARISON:  June 17, 2023. FINDINGS: Stable cardiomegaly. Hypoinflation of the lungs with minimal bibasilar subsegmental atelectasis. Bony thorax is unremarkable. IMPRESSION: Hypoinflation of the lungs with minimal bibasilar subsegmental atelectasis. Electronically Signed   By: Lupita Raider M.D.   On: 06/22/2023 09:56     LOS: 7 days   Signature  -    Susa Raring M.D on 06/24/2023 at 7:40 AM   -  To page go to [email or web address]

## 2023-06-24 NOTE — TOC Progression Note (Signed)
 Transition of Care [facility]) - Progression Note    Patient Details  Name: Logan French MRN: [id number] Date of Birth: 05/02/1934  [facility] of [facility] CM/SW Contact  Mearl Latin, LCSW Phone Number: 06/24/2023, 9:11 AM  Clinical Narrative:    CSW received request from MD to answer patient's spouse's questions about CIR process. CSW left her a voicemail.    Expected Discharge Plan: IP Rehab Facility Barriers to Discharge: Continued Medical Work up  Expected Discharge Plan and Services In-house Referral: Clinical Social Work Discharge Planning Services: CM Consult Post Acute Care Choice: IP Rehab Living arrangements for the past 2 months: Single Family Home                                       Social Determinants of Health (SDOH) Interventions SDOH Screenings   Food Insecurity: No Food Insecurity (06/17/2023)  Housing: Low Risk  (06/17/2023)  Transportation Needs: No Transportation Needs (06/17/2023)  Utilities: Not At Risk (06/17/2023)  Social Connections: Socially Integrated (06/17/2023)  Tobacco Use: Low Risk  (06/17/2023)    Readmission Risk Interventions     No data to display

## 2023-06-24 NOTE — Progress Notes (Signed)
 Occupational Therapy Treatment Patient Details Name: Logan French MRN: [id number] DOB: 1934/12/27 Today's Date: 06/24/2023   History of present illness Pt is an 89 y/o male admitted 4/3 for RLE cellulitis and nonhealing wound. +for bacetermia and hx of recent cat scratch. PMH: prostate cancer, anemia, HTN, HLD, a fib, b12 deficiency, PAD s/p vascular intervention.   OT comments  Pt making incremental progress with PO pain premedication coordination. Pt able to manage bed mobility with Mod A, stand in [place] from bed and recliner with Mod A x 2 w/ fair tolerance despite continued pain. Switched out bariatric recliner for regular recliner in pt room and built up recliner surface height w/ transfer improvements noted. Pt eager to mobilize and hopeful to progress to walking trials soon pending pain improvements.       If plan is discharge home, recommend the following:  A lot of help with walking and/or transfers;A lot of help with bathing/dressing/bathroom;Assistance with cooking/housework;Help with stairs or ramp for entrance;Assist for transportation;Two people to help with walking and/or transfers   Equipment Recommendations  Other (comment) (TBD pending progress)    Recommendations for Other Services Rehab consult    Precautions / Restrictions Precautions Precautions: Fall Restrictions Weight Bearing Restrictions Per Provider Order: No       Mobility Bed Mobility Overal bed mobility: Needs Assistance Bed Mobility: Supine to Sit     Supine to sit: Mod assist, HOB elevated, Used rails     General bed mobility comments: able to bring LE 75% to EOB before assist provided to scoot with bed pad. able to use bedrails to pull toward EOB with assist to lift trunk    Transfers Overall transfer level: Needs assistance Equipment used: Ambulation equipment used Transfers: Sit to/from Stand Sit to Stand: Mod assist, +2 physical assistance, +2 safety/equipment, Via lift equipment            General transfer comment: Mod A x 2 to stand from bedside, transfer to recliner via [place] (with stacked blankets) and also stand again from recliner height. Used bed pad to lift bottom and cued to pull self fully upright with fair carryover noted. Transfer via Lift Equipment: Stedy   Balance Overall balance assessment: Needs assistance Sitting-balance support: Feet supported, Bilateral upper extremity supported, No upper extremity supported Sitting balance-Leahy Scale: Fair     Standing balance support: Bilateral upper extremity supported, Reliant on assistive device for balance Standing balance-Leahy Scale: Poor                             ADL either performed or assessed with clinical judgement   ADL Overall ADL's : Needs assistance/impaired                     Lower Body Dressing: Total assistance;Bed level                 General ADL Comments: Emphasis on OOB attempts, modification of setup/room with OT locating regular recliner and buidling up surface height to improve transfer progression. Cued to work on pointing toes up to ceiling to avoid abnormal posturing with feet elevated in chair (placed rolled blanket as well as do not feel pt would tolerate prafo)    Extremity/Trunk Assessment Upper Extremity Assessment Upper Extremity Assessment: Overall WFL for tasks assessed;Right hand dominant   Lower Extremity Assessment Lower Extremity Assessment: Defer to PT evaluation        Vision  Vision Assessment?: No apparent visual deficits   Perception     Praxis     Communication Communication Communication: No apparent difficulties   Cognition Arousal: Alert Behavior During Therapy: WFL for tasks assessed/performed Cognition: Cognition impaired     Awareness: Intellectual awareness intact, Online awareness impaired Memory impairment (select all impairments): Short-term memory, Declarative long-term memory Attention impairment  (select first level of impairment): Sustained attention, Selective attention Executive functioning impairment (select all impairments): Sequencing, Reasoning, Problem solving OT - Cognition Comments: question hospital delirium w/ some minor confusion noted in conversation and trajectory of session. cues for safety, sequencing                 Following commands: Impaired Following commands impaired: Follows one step commands with increased time, Follows multi-step commands inconsistently      Cueing   Cueing Techniques: Verbal cues, Gestural cues, Visual cues  Exercises      Shoulder Instructions       General Comments      Pertinent Vitals/ Pain       Pain Assessment Pain Assessment: Faces Faces Pain Scale: Hurts even more Pain Location: L knee, R lower LE with movement Pain Descriptors / Indicators: Discomfort, Grimacing, Guarding Pain Intervention(s): Monitored during session, Premedicated before session  Home Living                                          Prior Functioning/Environment              Frequency  Min 2X/week        Progress Toward Goals  OT Goals(current goals can now be found in the care plan section)  Progress towards OT goals: Progressing toward goals  Acute Rehab OT Goals Patient Stated Goal: pain control, be able to walk OT Goal Formulation: With patient Time For Goal Achievement: 07/02/23 Potential to Achieve Goals: Good ADL Goals Pt Will Perform Lower Body Bathing: with modified independence;sit to/from stand;sitting/lateral leans Pt Will Perform Lower Body Dressing: with modified independence;sit to/from stand;sitting/lateral leans Pt Will Transfer to Toilet: with modified independence;ambulating  Plan      Co-evaluation                 AM-PAC OT "6 Clicks" Daily Activity     Outcome Measure   Help from another person eating meals?: None Help from another person taking care of personal grooming?:  A Little Help from another person toileting, which includes using toliet, bedpan, or urinal?: A Lot Help from another person bathing (including washing, rinsing, drying)?: A Lot Help from another person to put on and taking off regular upper body clothing?: A Little Help from another person to put on and taking off regular lower body clothing?: Total 6 Click Score: 15    End of Session Equipment Utilized During Treatment: Gait belt  OT Visit Diagnosis: Other abnormalities of gait and mobility (R26.89);Unsteadiness on feet (R26.81)   Activity Tolerance Patient tolerated treatment well   Patient Left in chair;with call bell/phone within reach;with chair alarm set   Nurse Communication Mobility status        Time: 9147-8295 OT Time Calculation (min): 37 min  Charges: OT General Charges $OT Visit: 1 Visit OT Treatments $Therapeutic Activity: 23-37 mins  Bradd Canary, OTR/L Acute Rehab Services Office: [phone]   Lorre Munroe 06/24/2023, 11:59 AM

## 2023-06-25 ENCOUNTER — Inpatient Hospital Stay (HOSPITAL_COMMUNITY)
Admission: AD | Admit: 2023-06-25 | Discharge: 2023-07-07 | DRG: 945 | Disposition: A | Discharge status: Home / Self Care | Source: Intra-hospital | Attending: Physical Medicine and Rehabilitation | Admitting: Physical Medicine and Rehabilitation

## 2023-06-25 ENCOUNTER — Encounter (HOSPITAL_COMMUNITY): Payer: Self-pay | Admitting: Physical Medicine and Rehabilitation

## 2023-06-25 ENCOUNTER — Other Ambulatory Visit: Payer: Self-pay

## 2023-06-25 DIAGNOSIS — Z9862 Peripheral vascular angioplasty status: Secondary | ICD-10-CM

## 2023-06-25 DIAGNOSIS — L97419 Non-pressure chronic ulcer of right heel and midfoot with unspecified severity: Secondary | ICD-10-CM | POA: Diagnosis present

## 2023-06-25 DIAGNOSIS — R32 Unspecified urinary incontinence: Secondary | ICD-10-CM | POA: Diagnosis not present

## 2023-06-25 DIAGNOSIS — E559 Vitamin D deficiency, unspecified: Secondary | ICD-10-CM | POA: Diagnosis present

## 2023-06-25 DIAGNOSIS — K5900 Constipation, unspecified: Secondary | ICD-10-CM | POA: Diagnosis not present

## 2023-06-25 DIAGNOSIS — L03115 Cellulitis of right lower limb: Secondary | ICD-10-CM | POA: Diagnosis present

## 2023-06-25 DIAGNOSIS — Z6828 Body mass index (BMI) 28.0-28.9, adult: Secondary | ICD-10-CM

## 2023-06-25 DIAGNOSIS — J9811 Atelectasis: Secondary | ICD-10-CM | POA: Diagnosis not present

## 2023-06-25 DIAGNOSIS — E11621 Type 2 diabetes mellitus with foot ulcer: Secondary | ICD-10-CM | POA: Diagnosis present

## 2023-06-25 DIAGNOSIS — D510 Vitamin B12 deficiency anemia due to intrinsic factor deficiency: Secondary | ICD-10-CM | POA: Diagnosis present

## 2023-06-25 DIAGNOSIS — Z860100 Personal history of colon polyps, unspecified: Secondary | ICD-10-CM

## 2023-06-25 DIAGNOSIS — Z7901 Long term (current) use of anticoagulants: Secondary | ICD-10-CM | POA: Diagnosis not present

## 2023-06-25 DIAGNOSIS — M170 Bilateral primary osteoarthritis of knee: Secondary | ICD-10-CM | POA: Diagnosis present

## 2023-06-25 DIAGNOSIS — E785 Hyperlipidemia, unspecified: Secondary | ICD-10-CM | POA: Diagnosis present

## 2023-06-25 DIAGNOSIS — E222 Syndrome of inappropriate secretion of antidiuretic hormone: Secondary | ICD-10-CM | POA: Diagnosis present

## 2023-06-25 DIAGNOSIS — R7881 Bacteremia: Secondary | ICD-10-CM | POA: Diagnosis not present

## 2023-06-25 DIAGNOSIS — Z79899 Other long term (current) drug therapy: Secondary | ICD-10-CM

## 2023-06-25 DIAGNOSIS — Z7902 Long term (current) use of antithrombotics/antiplatelets: Secondary | ICD-10-CM

## 2023-06-25 DIAGNOSIS — E1151 Type 2 diabetes mellitus with diabetic peripheral angiopathy without gangrene: Secondary | ICD-10-CM | POA: Diagnosis present

## 2023-06-25 DIAGNOSIS — R5381 Other malaise: Secondary | ICD-10-CM | POA: Diagnosis present

## 2023-06-25 DIAGNOSIS — I4891 Unspecified atrial fibrillation: Secondary | ICD-10-CM | POA: Diagnosis present

## 2023-06-25 DIAGNOSIS — R159 Full incontinence of feces: Secondary | ICD-10-CM | POA: Diagnosis not present

## 2023-06-25 DIAGNOSIS — I10 Essential (primary) hypertension: Secondary | ICD-10-CM | POA: Diagnosis present

## 2023-06-25 DIAGNOSIS — R066 Hiccough: Secondary | ICD-10-CM

## 2023-06-25 DIAGNOSIS — Z8546 Personal history of malignant neoplasm of prostate: Secondary | ICD-10-CM | POA: Diagnosis not present

## 2023-06-25 DIAGNOSIS — E871 Hypo-osmolality and hyponatremia: Secondary | ICD-10-CM | POA: Diagnosis not present

## 2023-06-25 DIAGNOSIS — R7989 Other specified abnormal findings of blood chemistry: Secondary | ICD-10-CM | POA: Diagnosis not present

## 2023-06-25 DIAGNOSIS — K5901 Slow transit constipation: Secondary | ICD-10-CM

## 2023-06-25 DIAGNOSIS — R351 Nocturia: Secondary | ICD-10-CM | POA: Diagnosis present

## 2023-06-25 DIAGNOSIS — E663 Overweight: Secondary | ICD-10-CM | POA: Diagnosis present

## 2023-06-25 DIAGNOSIS — S90822A Blister (nonthermal), left foot, initial encounter: Secondary | ICD-10-CM | POA: Diagnosis present

## 2023-06-25 LAB — GLUCOSE, CAPILLARY
Glucose-Capillary: 97 mg/dL (ref 70–99)
Glucose-Capillary: 160 mg/dL — ABNORMAL HIGH (ref 70–99)
Glucose-Capillary: 106 mg/dL — ABNORMAL HIGH (ref 70–99)
Glucose-Capillary: 126 mg/dL — ABNORMAL HIGH (ref 70–99)

## 2023-06-25 MED ORDER — FINASTERIDE 5 MG PO TABS
5.0000 mg | ORAL_TABLET | Freq: Every day | ORAL | Status: DC
  Administered 2023-06-26 – 2023-07-07 (×12): 5 mg via ORAL
  Filled 2023-06-25 (×12): qty 1

## 2023-06-25 MED ORDER — CLOPIDOGREL BISULFATE 75 MG PO TABS
75.0000 mg | ORAL_TABLET | Freq: Every day | ORAL | Status: DC
  Administered 2023-06-26 – 2023-07-07 (×12): 75 mg via ORAL
  Filled 2023-06-25 (×12): qty 1

## 2023-06-25 MED ORDER — SORBITOL 70 % SOLN: 30.0000 mL | Freq: Every day | Status: DC | PRN

## 2023-06-25 MED ORDER — STUDY - LIBREXIA-AF - APIXABAN 5 MG OR PLACEBO CAPSULE (PI-SETHI)
5.0000 mg | ORAL_CAPSULE | Freq: Two times a day (BID) | ORAL | Status: DC
  Administered 2023-06-25 – 2023-07-07 (×24): 5 mg via ORAL
  Filled 2023-06-25 (×23): qty 1

## 2023-06-25 MED ORDER — STUDY - LIBREXIA-AF - JNJ-70033093 (MILVEXIAN) 100 MG OR PLACEBO (PI-SETHI)
1.0000 | ORAL_TABLET | Freq: Two times a day (BID) | ORAL | Status: DC
  Administered 2023-06-25 – 2023-07-07 (×24): 1 via ORAL
  Filled 2023-06-25 (×23): qty 1

## 2023-06-25 MED ORDER — DICLOFENAC SODIUM 1 % EX GEL
2.0000 g | Freq: Four times a day (QID) | CUTANEOUS | Status: DC
  Administered 2023-06-25 – 2023-07-07 (×43): 2 g via TOPICAL
  Filled 2023-06-25: qty 100

## 2023-06-25 MED ORDER — ONDANSETRON HCL 4 MG PO TABS: 4.0000 mg | ORAL_TABLET | Freq: Four times a day (QID) | ORAL | Status: DC | PRN

## 2023-06-25 MED ORDER — ONDANSETRON HCL 4 MG/2ML IJ SOLN: 4.0000 mg | Freq: Four times a day (QID) | INTRAMUSCULAR | Status: DC | PRN

## 2023-06-25 MED ORDER — ROSUVASTATIN CALCIUM 20 MG PO TABS
20.0000 mg | ORAL_TABLET | Freq: Every day | ORAL | Status: DC
  Administered 2023-06-26 – 2023-07-07 (×12): 20 mg via ORAL
  Filled 2023-06-25 (×12): qty 1

## 2023-06-25 MED ORDER — ORAL CARE MOUTH RINSE: 15.0000 mL | OROMUCOSAL | Status: DC | PRN

## 2023-06-25 MED ORDER — OXYCODONE HCL 5 MG PO TABS
5.0000 mg | ORAL_TABLET | Freq: Three times a day (TID) | ORAL | Status: DC | PRN
  Administered 2023-06-26: 5 mg via ORAL
  Filled 2023-06-25: qty 1

## 2023-06-25 MED ORDER — INSULIN ASPART 100 UNIT/ML FLEXPEN: PEN_INJECTOR | SUBCUTANEOUS | Status: DC

## 2023-06-25 MED ORDER — SENNOSIDES-DOCUSATE SODIUM 8.6-50 MG PO TABS
1.0000 | ORAL_TABLET | Freq: Two times a day (BID) | ORAL | Status: DC
  Administered 2023-06-25 – 2023-06-27 (×4): 1 via ORAL
  Filled 2023-06-25 (×4): qty 1

## 2023-06-25 MED ORDER — MUPIROCIN 2 % EX OINT
TOPICAL_OINTMENT | Freq: Every day | CUTANEOUS | Status: DC
  Filled 2023-06-25: qty 22

## 2023-06-25 MED ORDER — PANTOPRAZOLE SODIUM 40 MG PO TBEC: 40.0000 mg | DELAYED_RELEASE_TABLET | Freq: Every day | ORAL | Status: DC

## 2023-06-25 MED ORDER — NEPRO/CARBSTEADY PO LIQD
237.0000 mL | Freq: Two times a day (BID) | ORAL | Status: DC
  Administered 2023-06-26 – 2023-06-27 (×3): 237 mL via ORAL

## 2023-06-25 MED ORDER — TRAMADOL HCL 50 MG PO TABS: 50.0000 mg | ORAL_TABLET | Freq: Two times a day (BID) | ORAL | Status: DC | PRN

## 2023-06-25 MED ORDER — METOPROLOL SUCCINATE ER 25 MG PO TB24
25.0000 mg | ORAL_TABLET | Freq: Two times a day (BID) | ORAL | Status: DC
  Administered 2023-06-25 – 2023-07-07 (×24): 25 mg via ORAL
  Filled 2023-06-25 (×24): qty 1

## 2023-06-25 MED ORDER — LIVING WELL WITH DIABETES BOOK
Freq: Once | Status: AC
  Filled 2023-06-25: qty 1

## 2023-06-25 MED ORDER — JUVEN PO PACK
1.0000 | PACK | Freq: Two times a day (BID) | ORAL | Status: DC
  Administered 2023-06-26 – 2023-07-07 (×23): 1 via ORAL
  Filled 2023-06-25 (×22): qty 1

## 2023-06-25 MED ORDER — COLCHICINE 0.6 MG PO TABS
0.6000 mg | ORAL_TABLET | Freq: Every day | ORAL | Status: DC
  Administered 2023-06-26 – 2023-07-07 (×12): 0.6 mg via ORAL
  Filled 2023-06-25 (×15): qty 1

## 2023-06-25 MED ORDER — INSULIN ASPART 100 UNIT/ML IJ SOLN
0.0000 [IU] | Freq: Three times a day (TID) | INTRAMUSCULAR | Status: DC
  Administered 2023-06-26: 2 [IU] via SUBCUTANEOUS

## 2023-06-25 MED ORDER — EZETIMIBE 10 MG PO TABS
10.0000 mg | ORAL_TABLET | Freq: Every day | ORAL | Status: DC
  Administered 2023-06-26 – 2023-07-07 (×12): 10 mg via ORAL
  Filled 2023-06-25 (×12): qty 1

## 2023-06-25 MED ORDER — AMOXICILLIN 500 MG PO CAPS
1000.0000 mg | ORAL_CAPSULE | Freq: Three times a day (TID) | ORAL | Status: AC
  Administered 2023-06-25 – 2023-07-01 (×19): 1000 mg via ORAL
  Filled 2023-06-25 (×19): qty 2

## 2023-06-25 MED ORDER — ACETAMINOPHEN 325 MG PO TABS
650.0000 mg | ORAL_TABLET | Freq: Four times a day (QID) | ORAL | Status: DC | PRN
  Administered 2023-06-27 (×2): 650 mg via ORAL
  Filled 2023-06-25 (×3): qty 2

## 2023-06-25 MED ORDER — POLYETHYLENE GLYCOL 3350 17 G PO PACK
17.0000 g | PACK | Freq: Every day | ORAL | Status: DC
  Administered 2023-06-26: 17 g via ORAL
  Filled 2023-06-25: qty 1

## 2023-06-25 MED ORDER — BACLOFEN 5 MG HALF TABLET: 5.0000 mg | ORAL_TABLET | Freq: Four times a day (QID) | ORAL | Status: DC | PRN

## 2023-06-25 MED ORDER — ACETAMINOPHEN 650 MG RE SUPP: 650.0000 mg | Freq: Four times a day (QID) | RECTAL | Status: DC | PRN

## 2023-06-25 MED ORDER — AMOXICILLIN 500 MG PO CAPS: 1000.0000 mg | ORAL_CAPSULE | Freq: Three times a day (TID) | ORAL | Status: DC

## 2023-06-25 MED ORDER — TRAMADOL HCL 50 MG PO TABS
50.0000 mg | ORAL_TABLET | Freq: Two times a day (BID) | ORAL | Status: DC | PRN
  Administered 2023-06-26 – 2023-07-05 (×2): 50 mg via ORAL
  Filled 2023-06-25 (×2): qty 1

## 2023-06-25 MED ORDER — PANTOPRAZOLE SODIUM 40 MG PO TBEC
40.0000 mg | DELAYED_RELEASE_TABLET | Freq: Every day | ORAL | Status: DC
  Administered 2023-06-26 – 2023-07-06 (×11): 40 mg via ORAL
  Filled 2023-06-25 (×11): qty 1

## 2023-06-25 NOTE — H&P (Signed)
 Physical Medicine and Rehabilitation Admission H&P        Chief Complaint  Patient presents with   Leg Pain  : HPI: Logan French. is a 89 year old right-handed male with history of atrial fibrillation with cardioversion 10/07/2021 per Dr.Tobb,Kardie, hyperlipidemia, syncope, hypertension, pernicious anemia, prostate cancer, peripheral vascular disease status post angioplasty of the right popliteal artery 05/17/2023 per Dr. Hetty Blend.  Per chart review patient lives with spouse.  Two-level home with bed and bath downstairs.  Independent driving using a cane at times.  Presented for 06/03/2023 for right leg cellulitis and wound .  Patient has stated since recent vascular procedure that wound has not healed the leg remains swollen with increasing pain as per her latest visit with general surgery 06/15/2023.  CT of the right lower extremity showed extensive subcutaneous edema of the right lower extremity asymmetrically more severe along the anterior lateral aspect.  No loculated subcutaneous fluid collection.  The edematous changes appear to be superficial to the investing muscular fascia.  Latest venous Doppler study showed no signs of DVT.  Admission chemistries unremarkable except sodium 134 glucose 134 BUN 26 creatinine 1.42, total bilirubin 1.3, lactic acid 2.8-2.4, WBC 18,900, hemoglobin A1c 6.4 blood cultures group A strep (S.  Pyogenes).  Infectious disease consulted and initially placed on penicillin and Zyvox and since changed to high-dose p.o. amoxicillin 1 g p.o. 3 times daily through 07/04/2023.Marland Kitchen  Latest blood culture showed no growth.  Echocardiogram shows no obvious vegetation.  Vascular surgery as well as Dr. Lajoyce Corners continues to follow wound and plan to be enrolled in venous leg ulcer study..  Wound care dressings as advised no other further plan for surgical intervention.  Hospital course hyponatremia/SIADH and currently maintained on fluid restriction with latest sodium 129.  History of atrial  fibrillation controlled rate maintained on Toprol and currently on Librexa AF trial (comparing Eliquis versus milvexian-factor XI inhibitor).  New diagnosis of diabetes mellitus type 2 hemoglobin A1c 6.4 SSI for now and plan metformin on discharge.  Patient with persistent left knee pain as noted Dopplers negative x-ray showed chronic arthritis placed on trial of colchicine and continue supportive care.  Uric acid levels were normal.  Therapy evaluations completed due to patient decreased functional mobility was admitted for a comprehensive rehab program.   Review of Systems  Constitutional:  Positive for malaise/fatigue. Negative for fever.  HENT:  Negative for hearing loss.   Eyes:  Negative for blurred vision and double vision.  Respiratory:  Negative for cough, shortness of breath and wheezing.   Cardiovascular:  Positive for palpitations and leg swelling.  Gastrointestinal:  Positive for constipation. Negative for heartburn, nausea and vomiting.  Genitourinary:  Positive for urgency. Negative for dysuria and hematuria.  Musculoskeletal:  Positive for myalgias.  Neurological:        History of syncope  All other systems reviewed and are negative.       Past Medical History:  Diagnosis Date   A-fib (HCC)     B12 deficiency     Diverticulosis     Fatigue     History of colonic polyps     Hyperlipemia     Hypertension     Pernicious anemia     Prostate cancer (HCC)     Syncope     Syncope               Past Surgical History:  Procedure Laterality Date   ABDOMINAL AORTOGRAM W/LOWER EXTREMITY N/A 05/17/2023  Procedure: ABDOMINAL AORTOGRAM W/LOWER EXTREMITY;  Surgeon: [name], MD;  Location: [facility] INVASIVE CV LAB;  Service: Cardiovascular;  Laterality: N/A;   CARDIOVERSION N/A 10/07/2021    Procedure: CARDIOVERSION;  Surgeon: Thomasene Ripple, DO;  Location: [facility] ENDOSCOPY;  Service: Cardiovascular;  Laterality: N/A;   IR ANGIO INTRA EXTRACRAN SEL COM CAROTID INNOMINATE BILAT MOD  SED   09/10/2017   IR ANGIO VERTEBRAL SEL SUBCLAVIAN INNOMINATE BILAT MOD SED   09/10/2017   PERIPHERAL INTRAVASCULAR LITHOTRIPSY Right 05/17/2023    Procedure: PERIPHERAL INTRAVASCULAR LITHOTRIPSY;  Surgeon: [name], MD;  Location: [facility] INVASIVE CV LAB;  Service: Cardiovascular;  Laterality: Right;  SFA-POP   PERIPHERAL VASCULAR BALLOON ANGIOPLASTY Right 05/17/2023    Procedure: PERIPHERAL VASCULAR BALLOON ANGIOPLASTY;  Surgeon: [name], MD;  Location: [facility] INVASIVE CV LAB;  Service: Cardiovascular;  Laterality: Right;  SFA-POP        History reviewed. No pertinent family history.     Social History:  reports that he has never smoked. He has never used smokeless tobacco. He reports current alcohol use of about 2.0 standard drinks of alcohol per week. He reports that he does not use drugs. Allergies:  Allergies  No Known Allergies         Medications Prior to Admission  Medication Sig Dispense Refill   acetaminophen (TYLENOL) 500 MG tablet Take 1,500 mg by mouth daily as needed for moderate pain (pain score 4-6), mild pain (pain score 1-3) or headache.       amLODipine (NORVASC) 5 MG tablet Take 5 mg by mouth daily.        clopidogrel (PLAVIX) 75 MG tablet Take 75 mg by mouth daily.       ezetimibe (ZETIA) 10 MG tablet Take 10 mg by mouth daily.       finasteride (PROSCAR) 5 MG tablet Take 5 mg by mouth daily.       hydrochlorothiazide (MICROZIDE) 12.5 MG capsule TAKE ONE CAPSULE EVERY DAY 90 capsule 2   metoprolol succinate (TOPROL-XL) 25 MG 24 hr tablet TAKE ONE TABLET BY MOUTH EVERY DAY 90 tablet 3   rosuvastatin (CRESTOR) 20 MG tablet TAKE ONE TABLET DAILY 90 tablet 3   STUDY - LIBREXIA-AF - apixaban 5 mg or placebo capsule (PI-Sethi) Take 1 capsule (5 mg total) by mouth 2 (two) times daily. Take at approximately the same time of day with or without food. Please bring bottle back with you to every visit; do not discard bottle. 280 capsule 0   STUDY - LIBREXIA-AF -  [id number] (Milvexian) 100 mg or placebo tablet (PI-Sethi) Take 1 tablet by mouth 2 (two) times daily. Take at approximately the same time of day with or without food. Please bring bottle back with you to every visit; do not discard bottle. 280 tablet 0   vitamin B-12 (CYANOCOBALAMIN) 500 MCG tablet Take 500 mcg by mouth daily.                   Home: Home Living Family/patient expects to be discharged to:: Private residence Living Arrangements: Spouse/significant other Available Help at Discharge: Family, Available 24 hours/day Type of Home: House Home Access: Stairs to enter [employer] of Steps: "just a couple" Home Layout: Multi-level, Able to live on main level with bedroom/bathroom Alternate Level Stairs-Number of Steps: 2 steps down into kitchen/living room. main level has bedroom/bathroom and an upstairs that pt does not have to use Home Equipment: Gilmer Mor - single point  Lives With: Spouse  Functional History: Prior Function Prior Level of Function : Independent/Modified Independent, Driving Mobility Comments: no AD for mobility ADLs Comments: Indep with ADLs, IADLs   Functional Status:  Mobility: Bed Mobility Overal bed mobility: Needs Assistance Bed Mobility: Supine to Sit Rolling: Mod assist Sidelying to sit: Min assist, Used rails Supine to sit: Mod assist, HOB elevated, Used rails Sit to supine: +2 for physical assistance, Max assist, Total assist, +2 for safety/equipment General bed mobility comments: able to bring LE 75% to EOB before assist provided to scoot with bed pad. able to use bedrails to pull toward EOB with assist to lift trunk Transfers Overall transfer level: Needs assistance Equipment used: Ambulation equipment used Transfers: Sit to/from Stand Sit to Stand: Mod assist, +2 physical assistance, +2 safety/equipment, Via lift equipment Bed to/from chair/wheelchair/BSC transfer type:: Via Lift equipment Step pivot transfers: Max assist,  +2 physical assistance, +2 safety/equipment, From elevated surface Transfer via Lift Equipment: Stedy General transfer comment: Mod A x 2 to stand from bedside, transfer to recliner via Stedy (with stacked blankets) and also stand again from recliner height. Used bed pad to lift bottom and cued to pull self fully upright with fair carryover noted. Ambulation/Gait General Gait Details: unable at this time.   ADL: ADL Overall ADL's : Needs assistance/impaired Eating/Feeding: Independent Grooming: Set up, Sitting Upper Body Bathing: Set up, Sitting Lower Body Bathing: Maximal assistance, Sit to/from stand Lower Body Bathing Details (indicate cue type and reason): cleaning performed after bowel incontenance Upper Body Dressing : Set up, Sitting Upper Body Dressing Details (indicate cue type and reason): gown Lower Body Dressing: Total assistance, Bed level Lower Body Dressing Details (indicate cue type and reason): assisted for socks Toilet Transfer: Maximal assistance, +2 for physical assistance Toilet Transfer Details (indicate cue type and reason): simulated to recliner Toileting- Clothing Manipulation and Hygiene: Maximal assistance General ADL Comments: Emphasis on OOB attempts, modification of setup/room with OT locating regular recliner and buidling up surface height to improve transfer progression. Cued to work on pointing toes up to ceiling to avoid abnormal posturing with feet elevated in chair (placed rolled blanket as well as do not feel pt would tolerate prafo)   Cognition: Cognition Orientation Level: Oriented X4 Cognition Arousal: Alert Behavior During Therapy: WFL for tasks assessed/performed   Physical Exam: Blood pressure 122/63, pulse 77, temperature 98 F (36.7 C), temperature source Oral, resp. rate 19, height 6' (1.829 m), weight 103.4 kg, SpO2 92%. Physical Exam Constitutional:      Appearance: He is not ill-appearing.     Comments: Persistent hiccups  HENT:      Right Ear: External ear normal.     Left Ear: External ear normal.     Nose: Nose normal.     Mouth/Throat:     Mouth: Mucous membranes are moist.  Eyes:     Comments: Right eye wanders  Cardiovascular:     Rate and Rhythm: Normal rate.     Heart sounds: No murmur heard.    No gallop.  Pulmonary:     Effort: Pulmonary effort is normal. No respiratory distress.     Breath sounds: No wheezing.  Abdominal:     General: There is no distension.     Palpations: Abdomen is soft.     Tenderness: There is no abdominal tenderness.  Musculoskeletal:        General: Swelling present.     Cervical back: Normal range of motion.     Right lower leg: Edema present.  Left lower leg: Edema present.     Comments: Chronic changes to both knees with mild effusions bilaterally.   Skin:    General: Skin is warm.     Comments: Right lower extremity wound measures about 1.5x 4cm in diameter and is generally pink and dry. 4x4 gauze, kerlix, ACE wrap in place with PRAFO. Some redness along right lateral leg which appears mostly due to external rotation of leg/contact with bed. Old knee incision from ACL repair right.   Neurological:     Mental Status: He is alert.     Comments: Alert and oriented x 3. Normal insight and awareness. Intact Memory. Normal language and speech. Cranial nerve exam unremarkable except for dysconjugate gaze. MMT: BUE 4 to 4+/5. RLE 2-3/5 HF, KE and 4-/5 ADF/PF. LLE 3/5 HF, KE and 4+/5 ADF/PF. Sensory exam normal for light touch and pain in all 4 limbs. No limb ataxia or cerebellar signs. No abnormal tone appreciated.  Marland Kitchen        Psychiatric:        Mood and Affect: Mood normal.        Behavior: Behavior normal.        Lab Results Last 48 Hours        Results for orders placed or performed during the hospital encounter of 06/17/23 (from the past 48 hours)  Glucose, capillary     Status: Abnormal    Collection Time: 06/23/23  8:49 AM  Result Value Ref Range     Glucose-Capillary 172 (H) 70 - 99 mg/dL      Comment: Glucose reference range applies only to samples taken after fasting for at least 8 hours.  Glucose, capillary     Status: Abnormal    Collection Time: 06/23/23 12:48 PM  Result Value Ref Range    Glucose-Capillary 134 (H) 70 - 99 mg/dL      Comment: Glucose reference range applies only to samples taken after fasting for at least 8 hours.  Glucose, capillary     Status: Abnormal    Collection Time: 06/23/23  4:25 PM  Result Value Ref Range    Glucose-Capillary 110 (H) 70 - 99 mg/dL      Comment: Glucose reference range applies only to samples taken after fasting for at least 8 hours.  Glucose, capillary     Status: Abnormal    Collection Time: 06/23/23  9:18 PM  Result Value Ref Range    Glucose-Capillary 125 (H) 70 - 99 mg/dL      Comment: Glucose reference range applies only to samples taken after fasting for at least 8 hours.  Basic metabolic panel with GFR     Status: Abnormal    Collection Time: 06/24/23  4:42 AM  Result Value Ref Range    Sodium 129 (L) 135 - 145 mmol/L    Potassium 3.9 3.5 - 5.1 mmol/L    Chloride 93 (L) 98 - 111 mmol/L    CO2 24 22 - 32 mmol/L    Glucose, Bld 104 (H) 70 - 99 mg/dL      Comment: Glucose reference range applies only to samples taken after fasting for at least 8 hours.    BUN 22 8 - 23 mg/dL    Creatinine, Ser 1.61 0.61 - 1.24 mg/dL    Calcium 8.0 (L) 8.9 - 10.3 mg/dL    GFR, Estimated >09 >60 mL/min      Comment: (NOTE) Calculated using the CKD-EPI Creatinine Equation (2021)  Anion gap 12 5 - 15      Comment: Performed at [facility], [address] [place], [place] [address]  Glucose, capillary     Status: Abnormal    Collection Time: 06/24/23  8:29 AM  Result Value Ref Range    Glucose-Capillary 101 (H) 70 - 99 mg/dL      Comment: Glucose reference range applies only to samples taken after fasting for at least 8 hours.  Glucose, capillary     Status: Abnormal     Collection Time: 06/24/23 12:59 PM  Result Value Ref Range    Glucose-Capillary 155 (H) 70 - 99 mg/dL      Comment: Glucose reference range applies only to samples taken after fasting for at least 8 hours.  Glucose, capillary     Status: Abnormal    Collection Time: 06/24/23  4:47 PM  Result Value Ref Range    Glucose-Capillary 124 (H) 70 - 99 mg/dL      Comment: Glucose reference range applies only to samples taken after fasting for at least 8 hours.  Glucose, capillary     Status: Abnormal    Collection Time: 06/24/23  9:21 PM  Result Value Ref Range    Glucose-Capillary 155 (H) 70 - 99 mg/dL      Comment: Glucose reference range applies only to samples taken after fasting for at least 8 hours.      Imaging Results (Last 48 hours)  No results found.         Blood pressure 122/63, pulse 77, temperature 98 F (36.7 C), temperature source Oral, resp. rate 19, height 6' (1.829 m), weight 103.4 kg, SpO2 92%.   Medical Problem List and Plan: 1. 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  Antithrombotics: -DVT/anticoagulation:  Mechanical: Antiembolism stockings, thigh (TED hose) Bilateral lower extremities             -antiplatelet therapy: Plavix 75 mg daily 3. Pain Management: Voltaren gel 4 times daily oxycodone as needed, tramadol as needed             -might benefit from additional support (brace/sleeve) to knees. 4. Mood/Behavior/Sleep: Provide emotional support             -antipsychotic agents: N/A 5. Neuropsych/cognition: This patient is capable of making decisions on his own behalf. 6. Skin/Wound Care:   -4 x 4 gauze dampened with Vashe to the right Achilles ulcer with kerlix and Ace  wrap from the metatarsal heads up to the tubercle.   -PRAFO right foot -I reapplied the dressing today 7. Fluids/Electrolytes/Nutrition: Routine INO's with follow-up chemistries 8.  Left knee pain.  Lower extremity Dopplers negative.  X-ray showed chronic arthritis.  Trial of colchicine.  Uric acid normal limits 9.  Hyponatremia/SIADH.  Fluid restriction.  Follow-up chemistries on Monday 10.  ID/bacteremia.  Was on combination of IV penicillin and Zyvox stopped 06/21/2023 changed to p.o. amoxicillin with stop date 07/04/2023 11.  Atrial fibrillation.  Rate controlled with oral beta-blocker/Toprol-XL 25 mg every 12 hours.Librexa AF trial drug (comparing Eliquis versus milvexian-factor XI inhibitor) 12.  New diagnosis of diabetes mellitus-2.  A1c 6.4.  Currently on SSI.  Plan metformin on discharge.             -reasonable control at present 13.  Hypertension.  Continue metoprolol.  HCTZ currently on hold. 14.  History of prostate cancer.  Pro scar 5 mg daily 15.  Hyperlipidemia.  Zetia/Crestor 16.  Constipation. Currently on MiraLAX daily, Colace 100 mg twice daily             -last bm 4/6             -pt admittedly is not eating much             -change colace to senna-s bid 17.  Hiccups.  Patient had been receiving Thorazine however very sedating.   -change to  baclofen 5mg  q6 prn to start.   Logan Rossetti Angiulli, PA-C 06/25/2023  I have personally performed a face to face diagnostic evaluation of this patient and formulated the key components of the plan.  Additionally, I have personally reviewed laboratory data, imaging studies, as well as relevant notes and concur with the physician assistant's documentation above.  The patient's status has not changed from the original H&P.  Any changes in documentation from the acute care chart have been noted above.  Ranelle Oyster, MD, [place]

## 2023-06-25 NOTE — Progress Notes (Signed)
 Ranelle Oyster, MD  Physician Physical Medicine and Rehabilitation   PMR Pre-admission    Signed   Date of Service: 06/23/2023  3:18 PM  Related encounter: ED to Hosp-Admission (Discharged) from 06/17/2023 in [place] [facility]   Signed     Expand All Collapse All  Show:Clear all [x] Written[x] Templated[x] Copied  Added by: [x] Standley Brooking, RN[x] Ranelle Oyster, MD  [] Hover for details PMR Admission Coordinator Pre-Admission Assessment   Patient: Logan French is an 89 y.o., male MRN: [id number] DOB: Oct 26, 1934 Height: 6' (182.9 cm) Weight: 103.4 kg   Insurance Information HMO:     PPO:      PCP:      IPA:      80/20:      OTHER:  PRIMARY: Medicare a and b      Policy#: [id number]      Subscriber: pt Benefits:  Phone #: passport one source online     Name: 4/9 Eff. Date: a 09/14/2002 and b 08/15/2011     Deduct: $1676      Out of Pocket Max: none      Life Max: none CIR: 100%      SNF: 20 full days Outpatient: 80%     Co-Pay: 20% Home Health: 100%      Co-Pay: none DME: 80%     Co-Pay: 20% Providers: in network  SECONDARY: BCBS supplement      Policy#: [id number]   Financial Counselor:       Phone#:    The "Data Collection Information Summary" for patients in Inpatient Rehabilitation Facilities with attached "Privacy Act Statement-Health Care Records" was provided and verbally reviewed with: Patient and Family   Emergency Contact Information Contact Information       Name Relation Home Work Mobile    Logan French Spouse [phone]        Logan French     [phone]         Other Contacts   None on File      Current Medical History  Patient Admitting Diagnosis: Debility   History of Present Illness: Willy Pinkerton. [name] is a 89 year old right-handed male with history of atrial fibrillation with cardioversion 10/07/2021 per Dr.Tobb,Kardie, hyperlipidemia, syncope, hypertension, pernicious anemia, prostate cancer,  peripheral vascular disease status post angioplasty of the right popliteal artery 05/17/2023 per Dr. Hetty Blend.  Presented for 06/03/2023 for right leg cellulitis and wound .    Patient has stated since recent vascular procedure that wound has not healed the leg remains swollen with increasing pain as per her latest visit with general surgery 06/15/2023.  CT of the right lower extremity showed extensive subcutaneous edema of the right lower extremity asymmetrically more severe along the anterior lateral aspect.  No loculated subcutaneous fluid collection.  The edematous changes appear to be superficial to the investing muscular fascia.  Latest venous Doppler study showed no signs of DVT.  Admission chemistries unremarkable except sodium 134 glucose 134 BUN 26 creatinine 1.42, total bilirubin 1.3, lactic acid 2.8-2.4, WBC 18,900, hemoglobin A1c 6.4 blood cultures group A strep (French.  Pyogenes).  Infectious disease consulted and initially placed on penicillin and Zyvox and since changed to high-dose p.o. amoxicillin 1 g p.o. 3 times daily through 07/04/2023.Marland Kitchen  Latest blood culture showed no growth.  Echocardiogram shows no obvious vegetation.  Vascular surgery as well as Dr. Lajoyce Corners continues to follow wound and plan to be enrolled in venous leg ulcer study..  Wound care dressings  as advised no other further plan for surgical intervention.  Hospital course hyponatremia/SIADH and currently maintained on fluid restriction with latest sodium 129.  History of atrial fibrillation controlled rate maintained on Toprol and currently on Librexa AF trial (comparing Eliquis versus milvexian-factor XI inhibitor).  New diagnosis of diabetes mellitus type 2 hemoglobin A1c 6.4 SSI for now and plan metformin on discharge.  Patient with persistent left knee pain as noted Dopplers negative x-ray showed chronic arthritis placed on trial of colchicine and continue supportive care.  Uric acid levels were normal.    Patient'French medical record from  [facility] has been reviewed by the rehabilitation admission coordinator and physician.   Past Medical History      Past Medical History:  Diagnosis Date   A-fib (HCC)     B12 deficiency     Diverticulosis     Fatigue     History of colonic polyps     Hyperlipemia     Hypertension     Pernicious anemia     Prostate cancer (HCC)     Syncope     Syncope          Has the patient had major surgery during 100 days prior to admission? Yes   Family History   family history is not on file.   Current Medications  Current Medications    Current Facility-Administered Medications:    acetaminophen (TYLENOL) tablet 650 mg, 650 mg, Oral, Q6H PRN, 650 mg at 06/17/23 2307 **OR** acetaminophen (TYLENOL) suppository 650 mg, 650 mg, Rectal, Q6H PRN, Gertha Calkin, MD   amoxicillin (AMOXIL) capsule 1,000 mg, 1,000 mg, Oral, Q8H, Vu, Trung T, MD, 1,000 mg at 06/25/23 1610   chlorproMAZINE (THORAZINE) 12.5 mg in sodium chloride 0.9 % 25 mL IVPB, 12.5 mg, Intravenous, Q8H PRN, Leroy Sea, MD, Last Rate: 50 mL/hr at 06/25/23 0947, 12.5 mg at 06/25/23 0947   clopidogrel (PLAVIX) tablet 75 mg, 75 mg, Oral, Daily, Ghimire, Shanker M, MD, 75 mg at 06/25/23 0950   colchicine tablet 0.6 mg, 0.6 mg, Oral, Daily, Susa Raring K, MD, 0.6 mg at 06/25/23 9604   diclofenac Sodium (VOLTAREN) 1 % topical gel 2 g, 2 g, Topical, QID, Leroy Sea, MD, 2 g at 06/25/23 0958   docusate sodium (COLACE) capsule 100 mg, 100 mg, Oral, BID, Irena Cords V, MD, 100 mg at 06/25/23 0948   ezetimibe (ZETIA) tablet 10 mg, 10 mg, Oral, Daily, Irena Cords V, MD, 10 mg at 06/25/23 0948   finasteride (PROSCAR) tablet 5 mg, 5 mg, Oral, Daily, Reome, Earle J, RPH, 5 mg at 06/25/23 0949   guaiFENesin-dextromethorphan (ROBITUSSIN DM) 100-10 MG/5ML syrup 5 mL, 5 mL, Oral, Q4H PRN, Susa Raring K, MD, 5 mL at 06/21/23 1620   insulin aspart (novoLOG) injection 0-9 Units, 0-9 Units, Subcutaneous, TID WC, Ghimire,  Werner Lean, MD, 2 Units at 06/24/23 2149   metoprolol succinate (TOPROL-XL) 24 hr tablet 25 mg, 25 mg, Oral, Q12H, Irena Cords V, MD, 25 mg at 06/25/23 0949   mupirocin ointment (BACTROBAN) 2 %, , Topical, Daily, Ghimire, Werner Lean, MD, Given at 06/25/23 1001   nutrition supplement (JUVEN) (JUVEN) powder packet 1 packet, 1 packet, Oral, BID BM, Nadara Mustard, MD, 1 packet at 06/25/23 0951   ondansetron (ZOFRAN) tablet 4 mg, 4 mg, Oral, Q6H PRN **OR** ondansetron (ZOFRAN) injection 4 mg, 4 mg, Intravenous, Q6H PRN, Gertha Calkin, MD   Oral care mouth rinse, 15 mL, Mouth Rinse,  PRN, Maretta Bees, MD   oxyCODONE (Oxy IR/ROXICODONE) immediate release tablet 5 mg, 5 mg, Oral, Q8H PRN, Leroy Sea, MD, 5 mg at 06/24/23 2143   pantoprazole (PROTONIX) EC tablet 40 mg, 40 mg, Oral, Q1200, Ghimire, Werner Lean, MD, 40 mg at 06/24/23 1326   polyethylene glycol (MIRALAX / GLYCOLAX) packet 17 g, 17 g, Oral, Daily, Ghimire, Shanker M, MD, 17 g at 06/25/23 0950   rosuvastatin (CRESTOR) tablet 20 mg, 20 mg, Oral, Daily, Irena Cords V, MD, 20 mg at 06/25/23 6213   STUDY - LIBREXIA-AF - apixaban 5 mg or placebo capsule (PI-Sethi), 5 mg, Oral, BID, Opyd, Lavone Neri, MD, 5 mg at 06/25/23 0831   STUDY - LIBREXIA-AF - [id number] (Milvexian) 100 mg or placebo tablet (PI-Sethi), 1 tablet, Oral, BID, Opyd, Lavone Neri, MD, 1 tablet at 06/25/23 0831   traMADol (ULTRAM) tablet 50 mg, 50 mg, Oral, Q12H PRN, Leroy Sea, MD, 50 mg at 06/24/23 1602     Patients Current Diet:  Diet Order                  Diet Heart Room service appropriate? Yes; Fluid consistency: Thin; Fluid restriction: 1200 mL Fluid  Diet effective now                       Precautions / Restrictions Precautions Precautions: Fall Other Brace: PRAFO RLE Restrictions Weight Bearing Restrictions Per Provider Order: No    Has the patient had 2 or more falls or a fall with injury in the past year? No   Prior Activity  Level Community (5-7x/wk): working part time as Recruitment consultant, drive, no AD   Prior Functional Level Self Care: Did the patient need help bathing, dressing, using the toilet or eating? Independent   Indoor Mobility: Did the patient need assistance with walking from room to room (with or without device)? Independent   Stairs: Did the patient need assistance with internal or external stairs (with or without device)? Independent   Functional Cognition: Did the patient need help planning regular tasks such as shopping or remembering to take medications? Independent   Patient Information Are you of Hispanic, Latino/a,or Spanish origin?: A. No, not of Hispanic, Latino/a, or Spanish origin What is your race?: A. White Do you need or want an interpreter to communicate with a doctor or health care staff?: 0. No   Patient'French Response To:  Health Literacy and Transportation Is the patient able to respond to health literacy and transportation needs?: Yes Health Literacy - How often do you need to have someone help you when you read instructions, pamphlets, or other written material from your doctor or pharmacy?: Never In the past 12 months, has lack of transportation kept you from medical appointments or from getting medications?: No In the past 12 months, has lack of transportation kept you from meetings, work, or from getting things needed for daily living?: No   Home Assistive Devices / Equipment Home Equipment: Gilmer Mor - single point   Prior Device Use: Indicate devices/aids used by the patient prior to current illness, exacerbation or injury? None of the above   Current Functional Level Cognition   Orientation Level: Oriented X4    Extremity Assessment (includes Sensation/Coordination)   Upper Extremity Assessment: Overall WFL for tasks assessed, Right hand dominant  Lower Extremity Assessment: Defer to PT evaluation RLE Deficits / Details: Edematous, erythematous     ADLs   Overall  ADL'French : Needs assistance/impaired  Eating/Feeding: Independent Grooming: Set up, Sitting Upper Body Bathing: Set up, Sitting Lower Body Bathing: Maximal assistance, Sit to/from stand Lower Body Bathing Details (indicate cue type and reason): cleaning performed after bowel incontenance Upper Body Dressing : Set up, Sitting Upper Body Dressing Details (indicate cue type and reason): gown Lower Body Dressing: Total assistance, Bed level Lower Body Dressing Details (indicate cue type and reason): assisted for socks Toilet Transfer: Maximal assistance, +2 for physical assistance Toilet Transfer Details (indicate cue type and reason): simulated to recliner Toileting- Clothing Manipulation and Hygiene: Maximal assistance General ADL Comments: Emphasis on OOB attempts, modification of setup/room with OT locating regular recliner and buidling up surface height to improve transfer progression. Cued to work on pointing toes up to ceiling to avoid abnormal posturing with feet elevated in chair (placed rolled blanket as well as do not feel pt would tolerate prafo)     Mobility   Overal bed mobility: Needs Assistance Bed Mobility: Supine to Sit Rolling: Mod assist Sidelying to sit: Min assist, Used rails Supine to sit: Mod assist, HOB elevated, Used rails Sit to supine: +2 for physical assistance, Max assist, Total assist, +2 for safety/equipment General bed mobility comments: able to bring LE 75% to EOB before assist provided to scoot with bed pad. able to use bedrails to pull toward EOB with assist to lift trunk     Transfers   Overall transfer level: Needs assistance Equipment used: Ambulation equipment used Transfers: Sit to/from Stand Sit to Stand: Mod assist, +2 physical assistance, +2 safety/equipment, Via lift equipment Bed to/from chair/wheelchair/BSC transfer type:: Via Lift equipment Step pivot transfers: Max assist, +2 physical assistance, +2 safety/equipment, From elevated  surface Transfer via Lift Equipment: Stedy General transfer comment: Mod A x 2 to stand from bedside, transfer to recliner via Stedy (with stacked blankets) and also stand again from recliner height. Used bed pad to lift bottom and cued to pull self fully upright with fair carryover noted.     Ambulation / Gait / Stairs / Wheelchair Mobility   Ambulation/Gait General Gait Details: unable at this time.     Posture / Balance Dynamic Sitting Balance Sitting balance - Comments: CGA static sitting EOB. Balance Overall balance assessment: Needs assistance Sitting-balance support: Feet supported, Bilateral upper extremity supported, No upper extremity supported Sitting balance-Leahy Scale: Fair Sitting balance - Comments: CGA static sitting EOB. Standing balance support: Bilateral upper extremity supported, Reliant on assistive device for balance Standing balance-Leahy Scale: Poor Standing balance comment: BUE support when standing     Special needs/care consideration attends [facility] as OP New DM 2 dx Hgb A1c 6.2                                                                  Bullins, Benard Halsted, RN  Registered Nurse WOC   Consult Note    Signed   Date of Service: 06/18/2023  6:41 AM    Signed       Show:Clear all [x] Written[x] Templated[] Copied   Added by: [x] Bullins, Benard Halsted, RN   [] Hover for details WOC Nurse Consult Note: patient followed at [facility] for R leg wound r/t venous insufficiency; has been admitted for cellulitis of this leg; last visit at [facility] patient switched to Bactroban  ointment and  Prisma (a collagen dressing not on formulary at [facility]); will use Aquacel AG as substitute  Reason for Consult: R lower leg wound  Wound type: full thickness r/t venous insufficiency  Pressure Injury POA: NA  Measurement: see nursing flowsheet  Wound bed:75% tan yellow 25% pink  Drainage (amount, consistency, odor) see nursing flowsheet  Periwound:  edema, erythema, hyperkeratotic skin  Dressing procedure/placement/frequency: Cleanse R lower leg (intact skin and wound) with Vashe wound cleanser, do not rinse and allow to air dry.  Apply Bactroban to wound bed daily, cover with silver hydrofiber Hart [place] [phone]) cut to fit wound bed, cover with dry gauze/ABD pad.  Secure dressing with Kerlix roll gauze beginning right above toes and ending right below knee.  Apply Ace bandage wrapped in same fashion as Kerlix for light compression. Patient should elevate leg as much as possible.    POC discussed with bedside nurse. WOC team will not follow. Re-consult if further needs arise.    Thank you,      Priscella Mann MSN, RN-BC, [employer] [phone]                    Previous Home Environment  Living Arrangements: Spouse/significant other  Lives With: Spouse Available Help at Discharge: Family, Available 24 hours/day Type of Home: House Home Layout: Multi-level, Able to live on main level with bedroom/bathroom Alternate Level Stairs-Number of Steps: 2 steps down into kitchen/living room. main level has bedroom/bathroom and an upstairs that pt does not have to use Home Access: Stairs to enter [employer] of Steps: "just a couple" Home Care Services: No   Discharge Living Setting Plans for Discharge Living Setting: Patient'French home, Lives with (comment) (spouse) Type of Home at Discharge: House Discharge Home Layout: Multi-level, Able to live on main level with bedroom/bathroom Alternate Level Stairs-Number of Steps: 2 steps down into living and kitchen area Discharge Home Access: Stairs to enter Does the patient have any problems obtaining your medications?: No   Social/Family/Support Systems Patient Roles: Spouse, 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 Information: son, Lorin Picket and wife, Harriett Sine Anticipated Caregiver: wife Anticipated Industrial/product designer Information: see contacts Ability/Limitations of  Caregiver: son in [place], wife without AD Caregiver Availability: 24/7 Discharge Plan Discussed with Primary Caregiver: Yes Is Caregiver In Agreement with Plan?: Yes Does Caregiver/Family have Issues with Lodging/Transportation while Pt is in Rehab?: No   Goals Patient/Family Goal for Rehab: Mod I to supervision with PT and OT Expected length of stay: ELOS 12 to 20 days Pt/Family Agrees to Admission and willing to participate: Yes Program Orientation Provided & Reviewed with Pt/Caregiver Including Roles  & Responsibilities: Yes   Decrease burden of Care through IP rehab admission: n/a   Possible need for SNF placement upon discharge: IF does not reach level of supervision to min to d/c directly home   Patient Condition: I have reviewed medical records from [facility], spoken with CM, and patient and son. I met with patient at the bedside and discussed via phone for inpatient rehabilitation assessment.  Patient will benefit from ongoing PT and OT, can actively participate in 3 hours of therapy a day 5 days of the week, and can make measurable gains during the admission.  Patient will also benefit from the coordinated team approach during an Inpatient Acute Rehabilitation admission.  The patient will receive intensive therapy as well as Rehabilitation physician, nursing, social worker, and care management interventions.  Due to bladder management, bowel management, safety,  skin/wound care, disease management, medication administration, pain management, and patient education the patient requires 24 hour a day rehabilitation nursing.  The patient is currently Mod assist overall with mobility and basic ADLs.  Discharge setting and therapy post discharge at home with home health is anticipated.  Patient has agreed to participate in the Acute Inpatient Rehabilitation Program and will admit today.   Preadmission Screen Completed By:  Worthy Keeler MSN 06/25/2023 10:43  AM ______________________________________________________________________   Discussed status with Dr. Riley Kill on 06/25/23 at 1043 and received approval for admission today.   Admission Coordinator:  Clois Dupes, RN MSN time 1043 Date 06/25/23    Assessment/Plan: Diagnosis: debility Does the need for close, 24 hr/day Medical supervision in concert with the patient'French rehab needs make it unreasonable for this patient to be served in a less intensive setting? Yes Co-Morbidities requiring supervision/potential complications: cellulitis, AKI, SIADH, a fib, dm2 Due to bladder management, bowel management, safety, skin/wound care, disease management, medication administration, pain management, and patient education, does the patient require 24 hr/day rehab nursing? Yes Does the patient require coordinated care of a physician, rehab nurse, PT, OT, and SLP to address physical and functional deficits in the context of the above medical diagnosis(es)? Yes Addressing deficits in the following areas: balance, endurance, locomotion, strength, transferring, bowel/bladder control, bathing, dressing, feeding, grooming, toileting, and psychosocial support Can the patient actively participate in an intensive therapy program of at least 3 hrs of therapy 5 days a week? Yes The potential for patient to make measurable gains while on inpatient rehab is excellent Anticipated functional outcomes upon discharge from inpatient rehab: modified independent and supervision PT, modified independent and supervision OT, n/a SLP Estimated rehab length of stay to reach the above functional goals is: 12-20 days Anticipated discharge destination: Home 10. Overall Rehab/Functional Prognosis: excellent     MD Signature: Ranelle Oyster, MD, [facility] Physical Medicine & Rehabilitation Medical Director Rehabilitation Services 06/25/2023           Revision History

## 2023-06-25 NOTE — Discharge Summary (Signed)
 Logan French JYN:[id number] DOB: 05/20/1934 DOA: 06/17/2023  PCP: Garlan Fillers, MD  Admit date: 06/17/2023  Discharge date: 06/25/2023  Admitted From: Home   Disposition:  CIR   Recommendations for Outpatient Follow-up:   Follow up with PCP in 1-2 weeks  PCP Please obtain BMP/CBC, 2 view CXR in 1week,  (see Discharge instructions)   PCP Please follow up on the following pending results:    Home Health: None   Equipment/Devices: None  Consultations: Ortho, ID, VVS Discharge Condition: Stable    CODE STATUS: Full    Diet Recommendation: Heart Healthy low carbohydrate, 1.5 L fluid restriction per day.    Chief Complaint  Patient presents with   Leg Pain     Brief history of present illness from the day of admission and additional interim summary     89 y.o.  male with history of chronic right leg wound-followed by wound care-s/p recent PCI to popliteal artery-presented with severe RLE pain/swelling/erythema following a cat scratch around 3/31.  Patient was found to have sepsis secondary to RLE cellulitis with streptococcal bacteremia and admitted to the hospitalist service.   Significant events: 4/3>> admitted to [facility]   Significant studies: 4/3>> CT right fibula/tibia: Extensive subcutaneous edema-no fluid collections-edematous changes appears superficial to muscular fascia. 4/4>> echo: EF 50%-no obvious vegetation.     Significant microbiology data: 4/3>> blood culture: Group A strep 4/4>> blood culture: No growth   Procedures: Bilateral lower extremity venous duplex.  Unremarkable. Bilateral lower extremity ABI.  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 Being seen by VVS, ID and Orthopedics Dr. Lajoyce Corners, DW Dr Lajoyce Corners 06/23/23, 06/24/23 Was on combination of IV penicillin and Zyvox,>> IV penicillin G, Zyvox was stopped by ID on 06/21/2023 & now switched to PO PCN with stop date 07/04/2023. TTE negative for vegetation, lateral lower extremity venous duplex unremarkable, ABIs abnormal bilaterally, repeat blood cultures negative thus far.  Requested patient to keep right lower extremity elevated with 2 pillows, nursing orders placed and staff updated, currently no signs of compartment syndrome.   W care per Dr Lajoyce Corners - Apply 4 x 4 gauze dampened with Vashe to the right Achilles ulcer.  Apply an ace wrap from the metatarsal heads up to the tibial tubercle. Wear PRAFO boot R  foot.   Will be discharged to CIR, continue above wound care, keep right lower extremity elevated with 2 pillows when in bed with PRAFO boots on to relieve heel pressure, must follow-up with Dr. Lajoyce Corners closely.  Overall cellulitis much improved.     PAD-s/p intravascular lithotripsy of above-knee popliteal artery/angioplasty of right popliteal artery/SFA on 3/3 Appreciate wound care consult Vascular surgery following-bilateral ABIs are abnormal   Some left knee pain.  Lower extremity venous duplex negative for DVT, x-ray shows chronic arthritis. Trial of colchicine and continue to monitor with supportive care.  Uric acid normal limits.   Hyponatremia.  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   ? Mild dysphagia - SLP eval, stable.   Persistent atrial  fibrillation Telemetry monitoring Rate controlled with oral beta-blocker On Librexa AF trial drug (comparing Eliquis versus milvexian - factor XI inhibitor)   HTN BP stable-continue metoprolol & HCTZ   HLD Statin   New diagnosis of DM-2 (A1c 6.4 on 4/4) low carbohydrate diet, metformin and SSI.    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  Pat dry.  Dampen a 4 x 4 gauze with Vashe applied to the wound and an Ace wrap for compression.  Continue using the pressure offloading boot to keep pressure off the Achilles at all times.   Discharge instructions   Complete by: As directed    Follow with Primary MD Garlan Fillers, MD in 7 days   Get CBC, CMP, 2 view Chest X ray -  checked next visit with your primary MD or CIR MD    Activity: As tolerated with Full fall precautions use walker/cane & assistance as needed  Disposition CIR  Diet: Heart Healthy low carbohydrate diet with strict 1.5 L fluid restriction per day, check CBGs q. [facility]  Special Instructions: If you have smoked or chewed Tobacco  in the last 2 yrs please stop smoking, stop any regular Alcohol  and or any Recreational drug use.  On your next visit with your primary care physician please Get Medicines reviewed and adjusted.  Please request your Prim.MD to go over all Hospital Tests and Procedure/Radiological results at the follow up, please get all Hospital records sent to your Prim MD by signing hospital release before you go home.  If you experience worsening of your admission symptoms, develop shortness of breath, life  threatening emergency, suicidal or homicidal thoughts you must seek medical attention immediately by calling 911 or calling your MD immediately  if symptoms less severe.  You Must read complete instructions/literature along with all the possible adverse reactions/side effects for all the Medicines you take and that have been prescribed to you. Take any new Medicines after you have completely understood and accpet all the possible adverse reactions/side effects.   Do not drive when taking Pain medications.  Do not take more than prescribed Pain, Sleep and Anxiety Medications  Wear Seat belts while driving.   Discharge wound care:  Complete by: As directed    Apply 4 x 4 gauze dampened with Vashe to the right Achilles ulcer.  Apply an ace wrap from the metatarsal heads up to the tibial tubercle. Wear PRAFO boot R foot.       Discharge Medications   Allergies as of 06/25/2023   No Known Allergies      Medication List     STOP taking these medications    amLODipine 5 MG tablet Commonly known as: NORVASC       TAKE these medications    acetaminophen 500 MG tablet Commonly known as: TYLENOL Take 1,500 mg by mouth daily as needed for moderate pain (pain score 4-6), mild pain (pain score 1-3) or headache.   amoxicillin 500 MG capsule Commonly known as: AMOXIL Take 2 capsules (1,000 mg total) by mouth every 8 (eight) hours.   clopidogrel 75 MG tablet Commonly known as: PLAVIX Take 75 mg by mouth daily.   ezetimibe 10 MG tablet Commonly known as: ZETIA Take 10 mg by mouth daily.   finasteride 5 MG tablet Commonly known as: PROSCAR Take 5 mg by mouth daily.   hydrochlorothiazide 12.5 MG capsule Commonly known as: MICROZIDE TAKE ONE CAPSULE EVERY DAY   insulin aspart 100 UNIT/ML FlexPen Commonly known as: NOVOLOG Before each meal 3 times a day, 140-199 - 2 units, 200-250 - 4 units, 251-299 - 6 units,  300-349 - 8 units,  350 or above 10 units.   LIBREXIA-AF apixaban  or placebo 5 mg Caps capsule Take 1 capsule (5 mg total) by mouth 2 (two) times daily. Take at approximately the same time of day with or without food. Please bring bottle back with you to every visit; do not discard bottle.   LIBREXIA-AF [id number] (Milvexian) or placebo 100 mg tablet Take 1 tablet by mouth 2 (two) times daily. Take at approximately the same time of day with or without food. Please bring bottle back with you to every visit; do not discard bottle.   metoprolol succinate 25 MG 24 hr tablet Commonly known as: TOPROL-XL TAKE ONE TABLET BY MOUTH EVERY DAY   pantoprazole 40 MG tablet Commonly known as: PROTONIX Take 1 tablet (40 mg total) by mouth daily at 12 noon.   rosuvastatin 20 MG tablet Commonly known as: CRESTOR TAKE ONE TABLET DAILY   traMADol 50 MG tablet Commonly known as: ULTRAM Take 1 tablet (50 mg total) by mouth every 12 (twelve) hours as needed for moderate pain (pain score 4-6).   vitamin B-12 500 MCG tablet Commonly known as: CYANOCOBALAMIN Take 500 mcg by mouth daily.               Discharge Care Instructions  (From admission, onward)           Start     Ordered   06/25/23 0000  Discharge wound care:       Comments: Apply 4 x 4 gauze dampened with Vashe to the right Achilles ulcer.  Apply an ace wrap from the metatarsal heads up to the tibial tubercle. Wear PRAFO boot R foot.   06/25/23 0918   06/24/23 0000  Change dressing       Comments: Wash the ankle ulcer with soap and water daily.  Pat dry.  Dampen a 4 x 4 gauze with Vashe applied to the wound and an Ace wrap for compression.  Continue using the pressure offloading boot to keep pressure off the Achilles at all times.   06/24/23 9811  Follow-up Information     Nadara Mustard, MD Follow up in 1 week(s).   Specialty: Orthopedic Surgery Contact information: [address] [place] [address] [phone]         Garlan Fillers, MD. Schedule an  appointment as soon as possible for a visit in 1 week(s).   Specialty: Internal Medicine Contact information: [address] [place] [address] [phone]         Nada Libman, MD. Schedule an appointment as soon as possible for a visit in 2 week(s).   Specialties: Vascular Surgery, Cardiology Contact information: [address] [place] [address] [phone]                 Major procedures and Radiology Reports - PLEASE review detailed and final reports thoroughly  -      DG Chest Port 1 View Result Date: 06/22/2023 CLINICAL DATA:  Shortness of breath. EXAM: PORTABLE CHEST 1 VIEW COMPARISON:  June 17, 2023. FINDINGS: Stable cardiomegaly. Hypoinflation of the lungs with minimal bibasilar subsegmental atelectasis. Bony thorax is unremarkable. IMPRESSION: Hypoinflation of the lungs with minimal bibasilar subsegmental atelectasis. Electronically Signed   By: Lupita Raider M.D.   On: 06/22/2023 09:56   VAS [place] LOWER EXTREMITY ARTERIAL DUPLEX Result Date: 06/20/2023 LOWER EXTREMITY ARTERIAL DUPLEX STUDY Patient Name:  Logan French  Date of Exam:   06/19/2023 Medical Rec #: [id number]           Accession #:    [id number] Date of Birth: 12-Jul-1934           Patient Gender: M Patient Age:   89 years Exam Location:  [facility] Procedure:      VAS [place] LOWER EXTREMITY ARTERIAL DUPLEX Referring Phys: Emilie Rutter --------------------------------------------------------------------------------  Indications: Ulceration. Cellulitis. High Risk Factors: Hypertension, hyperlipidemia. Other Factors: Atrial fibrillation.  Vascular Interventions: Abdominal aortogram with LE peripheral                         intravascular lithotripsy and balloon angioplasty of the                         SFA-Pop                          05/17/23. Current ABI:            R:patent DP and AT TBI:0.62 L:0.98 TBI 0.35 Limitations: Edema and pain with touch secondary to infection Comparison Study: Prior  right LE arterial duplex done 04/26/23 Performing Technologist: Sherren Kerns RVS  Examination Guidelines: A complete evaluation includes B-mode imaging, spectral Doppler, color Doppler, and power Doppler as needed of all accessible portions of each vessel. Bilateral testing is considered an integral part of a complete examination. Limited examinations for reoccurring indications may be performed as noted.  +----------+--------+-----+---------------+-----------+--------+ RIGHT     PSV cm/sRatioStenosis       Waveform   Comments +----------+--------+-----+---------------+-----------+--------+ CFA Distal65                          multiphasic         +----------+--------+-----+---------------+-----------+--------+ DFA       58                          multiphasic         +----------+--------+-----+---------------+-----------+--------+  SFA Prox  124                         biphasic            +----------+--------+-----+---------------+-----------+--------+ SFA Mid   264          30-49% stenosisbiphasic            +----------+--------+-----+---------------+-----------+--------+ SFA Distal235          30-49% stenosisbiphasic            +----------+--------+-----+---------------+-----------+--------+ POP Prox  80                          monophasic          +----------+--------+-----+---------------+-----------+--------+ POP Mid   66                          monophasic          +----------+--------+-----+---------------+-----------+--------+ POP Distal60                          monophasic          +----------+--------+-----+---------------+-----------+--------+ TP Trunk  116                         monophasic          +----------+--------+-----+---------------+-----------+--------+ ATA Prox  129                         monophasic          +----------+--------+-----+---------------+-----------+--------+ ATA Mid   133                          monophasic          +----------+--------+-----+---------------+-----------+--------+ ATA Distal105                         monophasic          +----------+--------+-----+---------------+-----------+--------+ PTA Prox               occluded                           +----------+--------+-----+---------------+-----------+--------+ PTA Mid                occluded                           +----------+--------+-----+---------------+-----------+--------+ PTA Distal             occluded                           +----------+--------+-----+---------------+-----------+--------+  Summary: Right: 30-49% stenosis noted in the mid and distal superficial femoral artery. PTA remains occluded.  See table(s) above for measurements and observations. Electronically signed by Coral Else MD on 06/20/2023 at 10:45:17 AM.    Final    DG Tibia/Fibula Right Result Date: 06/20/2023 CLINICAL DATA:  Lower extremity cellulitis. EXAM: RIGHT TIBIA AND FIBULA - 2 VIEW COMPARISON:  None Available. FINDINGS: No evidence for an acute fracture. No bony destruction in the tibia or fibula to suggest osteomyelitis. Mass degenerative changes are seen at the knee. No soft tissue gas evident in the  leg. IMPRESSION: 1. No findings to suggest osteomyelitis in the tibia or fibula 2. Degenerative changes at the knee. Electronically Signed   By: Kennith Center M.D.   On: 06/20/2023 08:03   DG Knee Complete 4 Views Left Result Date: 06/20/2023 CLINICAL DATA:  Recurrent left knee pain. EXAM: LEFT KNEE - COMPLETE 4+ VIEW COMPARISON:  None Available. FINDINGS: No evidence of fracture, dislocation, or joint effusion. Minimal spurring is seen involving medial and lateral joint spaces. Chondrocalcinosis is also noted most consistent with early degenerative joint disease. Vascular calcifications are noted. IMPRESSION: Probable mild degenerative joint disease. No acute abnormality seen. Electronically Signed   By: Lupita Raider M.D.    On: 06/20/2023 08:03   VAS [place] ABI WITH/WO TBI Result Date: 06/19/2023  LOWER EXTREMITY DOPPLER STUDY Patient Name:  Logan French  Date of Exam:   06/19/2023 Medical Rec #: [id number]           Accession #:    [id number] Date of Birth: 12/05/1934           Patient Gender: M Patient Age:   47 years Exam Location:  [facility] Procedure:      VAS [place] ABI WITH/WO TBI Referring Phys: Emilie Rutter --------------------------------------------------------------------------------  Indications: Ulceration. Cellulitis High Risk Factors: Hypertension, hyperlipidemia. Other Factors: Atrial fibrillation.  Vascular Interventions: Abdominal aortogram with LE peripheral intravascular                         lithotripsy and balloon angioplasty of the SFA-Pop                         05/17/23. Limitations: Today's exam was limited due to an open wound. Comparison Study: Prior ABI done 04/26/23 Performing Technologist: Sherren Kerns RVS  Examination Guidelines: A complete evaluation includes at minimum, Doppler waveform signals and systolic blood pressure reading at the level of bilateral brachial, anterior tibial, and posterior tibial arteries, when vessel segments are accessible. Bilateral testing is considered an integral part of a complete examination. Photoelectric Plethysmograph (PPG) waveforms and toe systolic pressure readings are included as required and additional duplex testing as needed. Limited examinations for reoccurring indications may be performed as noted.  ABI Findings: +---------+------------------+-----+---------+--------+ Right    Rt Pressure (mmHg)IndexWaveform Comment  +---------+------------------+-----+---------+--------+ Brachial 110                    triphasic         +---------+------------------+-----+---------+--------+ PTA                             absent            +---------+------------------+-----+---------+--------+ DP                              biphasic           +---------+------------------+-----+---------+--------+ Great Toe72                0.62                   +---------+------------------+-----+---------+--------+ +---------+------------------+-----+---------+-------+ Left     Lt Pressure (mmHg)IndexWaveform Comment +---------+------------------+-----+---------+-------+ Brachial 117                    triphasic        +---------+------------------+-----+---------+-------+ PTA  115               0.98 biphasic         +---------+------------------+-----+---------+-------+ DP       113               0.97 biphasic         +---------+------------------+-----+---------+-------+ Great Toe41                0.35                  +---------+------------------+-----+---------+-------+ +-------+----------------+-----------+------------------+------------+ ABI/TBIToday's ABI     Today's TBIPrevious ABI      Previous TBI +-------+----------------+-----------+------------------+------------+ Right  AT and DP patent0.62       0.73 (occluded PT)0.45         +-------+----------------+-----------+------------------+------------+ Left   0.98            0.35       0.85              0.70         +-------+----------------+-----------+------------------+------------+ Bilateral TBIs appear decreased compared to prior study on 04/26/23. Left ABIs appear increased compared to prior study on 04/26/23.  Summary: Right: The right toe-brachial index is abnormal. Left: Resting left ankle-brachial index is within normal range. The left toe-brachial index is abnormal. *See table(s) above for measurements and observations.  Electronically signed by Coral Else MD on 06/19/2023 at 2:11:36 PM.    Final    VAS [place] LOWER EXTREMITY VENOUS (DVT) Result Date: 06/19/2023  Lower Venous DVT Study Patient Name:  Logan French  Date of Exam:   06/19/2023 Medical Rec #: [id number]           Accession #:    [id number] Date of Birth: 22-Aug-1934            Patient Gender: M Patient Age:   89 years Exam Location:  [facility] Procedure:      VAS [place] LOWER EXTREMITY VENOUS (DVT) Referring Phys: Odette Fraction --------------------------------------------------------------------------------  Indications: Pain, Swelling, Erythema, and Cellulitis. Open wound on achilles heel.  Limitations: Edema, pain with compression maneuvers/touch. Comparison Study: No prior study on file Performing Technologist: Sherren Kerns RVS  Examination Guidelines: A complete evaluation includes B-mode imaging, spectral Doppler, color Doppler, and power Doppler as needed of all accessible portions of each vessel. Bilateral testing is considered an integral part of a complete examination. Limited examinations for reoccurring indications may be performed as noted. The reflux portion of the exam is performed with the patient in reverse Trendelenburg.  +---------+---------------+---------+-----------+---------------+--------------+ RIGHT    CompressibilityPhasicitySpontaneityProperties     Thrombus Aging +---------+---------------+---------+-----------+---------------+--------------+ CFV      Full           Yes      No         pulsatile                                                                 waveforms                     +---------+---------------+---------+-----------+---------------+--------------+ SFJ      Full                                                             +---------+---------------+---------+-----------+---------------+--------------+  FV Prox                 Yes      No         pulsatile                                                                 waveforms                     +---------+---------------+---------+-----------+---------------+--------------+ FV Mid                  Yes      No         pulsatile                                                                 waveforms                      +---------+---------------+---------+-----------+---------------+--------------+ FV Distal               Yes      No         pulsatile                                                                 waveforms                     +---------+---------------+---------+-----------+---------------+--------------+ PFV                     Yes      No         pulsatile                                                                 waveforms                     +---------+---------------+---------+-----------+---------------+--------------+ POP                     Yes      No         pulsatile                                                                 waveforms                     +---------+---------------+---------+-----------+---------------+--------------+  PTV                                                        Not well                                                                  visualized     +---------+---------------+---------+-----------+---------------+--------------+ PERO                                                       Not well                                                                  visualized     +---------+---------------+---------+-----------+---------------+--------------+ Gastroc                 Yes      No                                       +---------+---------------+---------+-----------+---------------+--------------+   +----+---------------+---------+-----------+----------+--------------+ LEFTCompressibilityPhasicitySpontaneityPropertiesThrombus Aging +----+---------------+---------+-----------+----------+--------------+ CFV Full           Yes      Yes                                 +----+---------------+---------+-----------+----------+--------------+     Summary: RIGHT: - There is no evidence of deep vein thrombosis in the lower extremity. However, portions of this examination were  limited- see technologist comments above.  - No cystic structure found in the popliteal fossa. pulsatile waveforms noted  LEFT: - No evidence of common femoral vein obstruction.   *See table(s) above for measurements and observations. Electronically signed by Coral Else MD on 06/19/2023 at 2:05:25 PM.    Final    DG Tibia/Fibula Right Port Result Date: 06/19/2023 CLINICAL DATA:  Leg pain. EXAM: PORTABLE RIGHT TIBIA AND FIBULA - 2 VIEW COMPARISON:  CT 06/17/2023 FINDINGS: No fracture. No erosive or bony destructive change. Knee osteoarthritis and chondrocalcinosis again seen. Again seen calcifications in the region of the Achilles tendon. There is generalized subcutaneous edema. Arterial vascular calcifications. No soft tissue gas. IMPRESSION: 1. Generalized subcutaneous edema. No acute osseous abnormality. 2. Knee osteoarthritis and chondrocalcinosis. Electronically Signed   By: Narda Rutherford M.D.   On: 06/19/2023 10:05   ECHOCARDIOGRAM COMPLETE Result Date: 06/18/2023    ECHOCARDIOGRAM REPORT   Patient Name:   Logan French Date of Exam: 06/18/2023 Medical Rec #:  [id number]          Height:       72.0 in Accession #:  [id number]         Weight:       216.9 lb Date of Birth:  08-26-1934          BSA:          2.205 m Patient Age:    88 years           BP:           111/74 mmHg Patient Gender: M                  HR:           68 bpm. Exam Location:  Inpatient Procedure: 2D Echo, Cardiac Doppler and Color Doppler (Both Spectral and Color            Flow Doppler were utilized during procedure). Indications:    Bacteremia  History:        Patient has prior history of Echocardiogram examinations, most                 recent 09/08/2021. Arrythmias:Atrial Fibrillation,                 Signs/Symptoms:Edema, Fatigue, Dizziness/Lightheadedness and                 Syncope; Risk Factors:Hypertension and Dyslipidemia.  Sonographer:    Vern Claude Referring Phys: [id number] Dewayne Shorter M GHIMIRE IMPRESSIONS  1. Left  ventricular ejection fraction, by estimation, is 50%. The left ventricle has mildly decreased function. The left ventricle demonstrates regional wall motion abnormalities with suspected basal to mid inferolateral and basal anterolateral hypokinesis. However, endocardial definition was poor. Definity contrast may have helped but was not used. The left ventricular internal cavity size was mildly dilated. Left ventricular diastolic parameters are indeterminate.  2. Right ventricular systolic function is mildly reduced. The right ventricular size is normal.  3. Left atrial size was mildly dilated.  4. The mitral valve is normal in structure. Mild mitral valve regurgitation. No evidence of mitral stenosis.  5. The aortic valve is tricuspid. There is moderate calcification of the aortic valve. Aortic valve regurgitation is trivial. Aortic valve sclerosis/calcification is present, without any evidence of aortic stenosis.  6. Aortic dilatation noted. There is mild dilatation of the aortic root, measuring 39 mm.  7. The IVC was not visualized.  8. No definite valvular vegetation was visualized, but technically difficult study, and if endocarditis is suspected will need TEE. FINDINGS  Left Ventricle: Left ventricular ejection fraction, by estimation, is 50%. The left ventricle has mildly decreased function. The left ventricle demonstrates regional wall motion abnormalities. The left ventricular internal cavity size was mildly dilated. There is no left ventricular hypertrophy. Left ventricular diastolic parameters are indeterminate. Right Ventricle: The right ventricular size is normal. No increase in right ventricular wall thickness. Right ventricular systolic function is mildly reduced. Left Atrium: Left atrial size was mildly dilated. Right Atrium: Right atrial size was normal in size. Pericardium: There is no evidence of pericardial effusion. Mitral Valve: The mitral valve is normal in structure. There is mild  calcification of the mitral valve leaflet(s). Mild mitral annular calcification. Mild mitral valve regurgitation. No evidence of mitral valve stenosis. MV peak gradient, 6.0 mmHg. The mean mitral valve gradient is 1.0 mmHg. Tricuspid Valve: The tricuspid valve is normal in structure. Tricuspid valve regurgitation is mild. Aortic Valve: The aortic valve is tricuspid. There is moderate calcification of the aortic valve. Aortic valve regurgitation is trivial. Aortic valve sclerosis/calcification is present, without any evidence  of aortic stenosis. Aortic valve mean gradient measures 4.3 mmHg. Aortic valve peak gradient measures 7.8 mmHg. Aortic valve area, by VTI measures 1.59 cm. Pulmonic Valve: The pulmonic valve was normal in structure. Pulmonic valve regurgitation is trivial. Aorta: Aortic dilatation noted. There is mild dilatation of the aortic root, measuring 39 mm. Venous: The IVC was not visualized. IAS/Shunts: No atrial level shunt detected by color flow Doppler.  LEFT VENTRICLE PLAX 2D LVIDd:         6.30 cm      Diastology LVIDs:         4.00 cm      LV e' medial:    9.79 cm/s LV PW:         0.80 cm      LV E/e' medial:  10.4 LV IVS:        0.90 cm      LV e' lateral:   13.70 cm/s LVOT diam:     2.00 cm      LV E/e' lateral: 7.4 LV SV:         32 LV SV Index:   14 LVOT Area:     3.14 cm  LV Volumes (MOD) LV vol d, MOD A2C: 112.0 ml LV vol d, MOD A4C: 122.0 ml LV vol s, MOD A2C: 44.8 ml LV vol s, MOD A4C: 47.6 ml LV SV MOD A2C:     67.2 ml LV SV MOD A4C:     122.0 ml LV SV MOD BP:      68.8 ml RIGHT VENTRICLE RV Basal diam:  3.40 cm RV Mid diam:    2.30 cm RV S prime:     9.17 cm/s LEFT ATRIUM             Index        RIGHT ATRIUM           Index LA diam:        5.30 cm 2.40 cm/m   RA Area:     16.60 cm LA Vol (A2C):   48.1 ml 21.81 ml/m  RA Volume:   35.00 ml  15.87 ml/m LA Vol (A4C):   53.6 ml 24.30 ml/m LA Biplane Vol: 54.3 ml 24.62 ml/m  AORTIC VALVE                    PULMONIC VALVE AV Area  (Vmax):    1.46 cm     PV Vmax:       0.88 m/s AV Area (Vmean):   1.29 cm     PV Peak grad:  3.1 mmHg AV Area (VTI):     1.59 cm AV Vmax:           139.33 cm/s AV Vmean:          93.500 cm/s AV VTI:            0.200 m AV Peak Grad:      7.8 mmHg AV Mean Grad:      4.3 mmHg LVOT Vmax:         64.90 cm/s LVOT Vmean:        38.500 cm/s LVOT VTI:          0.101 m LVOT/AV VTI ratio: 0.51  AORTA Ao Root diam: 3.90 cm Ao Asc diam:  3.30 cm MITRAL VALVE                TRICUSPID VALVE MV Area (PHT): 3.66 cm  TR Peak grad:   24.4 mmHg MV Area VTI:   1.50 cm     TR Vmax:        247.00 cm/s MV Peak grad:  6.0 mmHg MV Mean grad:  1.0 mmHg     SHUNTS MV Vmax:       1.22 m/s     Systemic VTI:  0.10 m MV Vmean:      50.1 cm/s    Systemic Diam: 2.00 cm MV Decel Time: 207 msec MR Peak grad: 105.5 mmHg MR Mean grad: 69.0 mmHg MR Vmax:      513.50 cm/s MR Vmean:     388.5 cm/s MV E velocity: 102.00 cm/s Dalton McleanMD Electronically signed by Wilfred Lacy Signature Date/Time: 06/18/2023/3:10:23 PM    Final    CT TIBIA FIBULA RIGHT WO CONTRAST Result Date: 06/17/2023 CLINICAL DATA:  right leg wound and cellulitis. EXAM: CT OF THE LOWER RIGHT EXTREMITY WITHOUT CONTRAST TECHNIQUE: Multidetector CT imaging of the right lower extremity was performed according to the standard protocol. RADIATION DOSE REDUCTION: This exam was performed according to the departmental dose-optimization program which includes automated exposure control, adjustment of the mA and/or kV according to patient size and/or use of iterative reconstruction technique. COMPARISON:  None Available. FINDINGS: Bones/Joint/Cartilage Normal alignment. No acute fracture or dislocation. 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 No osseous erosions. Ligaments Suboptimally assessed by CT. Muscles and Tendons There is marked fatty atrophy of the visualized skeletal  musculature,. There is thickening and intrasubstance calcification of the mid to distal Achilles tendon, likely the sequela of remote inflammation or trauma. No intramuscular fluid collections or intrafascial edema. Soft tissues There is extensive subcutaneous edema of the right lower extremity asymmetrically more severe along the anterolateral aspect. No loculated subcutaneous fluid collections. The edematous changes appear superficial to the investing muscular fascia. Small right knee effusion is partially visualized. IMPRESSION: 1. Extensive subcutaneous edema of the right lower extremity asymmetrically more severe along the anterolateral aspect. No loculated subcutaneous fluid collections. The edematous changes appear superficial to the investing muscular fascia. 2. No acute fracture or dislocation. No osseous erosions. 3. 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 4. 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 5. Marked fatty atrophy of the visualized skeletal musculature. 6. Thickening and intrasubstance calcification of the mid to distal Achilles tendon, likely the sequela of remote inflammation or trauma. Electronically Signed   By: Helyn Numbers M.D.   On: 06/17/2023 21:30   DG Chest Port 1 View Result Date: 06/17/2023 CLINICAL DATA:  Questionable sepsis, presented for evaluation fevers at home. EXAM: PORTABLE CHEST 1 VIEW COMPARISON:  None Available. FINDINGS: There is a mildly low inspiration. There is linear atelectasis in lung bases. No focal pneumonia is evident. There is no substantial pleural effusion. There is mild cardiomegaly, without evidence of CHF. The mediastinum is normally outlined. There is calcification in the transverse aorta. Moderate thoracic spondylosis. 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 IMPRESSION: 1. Low inspiration with linear atelectasis in lung  bases. No focal pneumonia is evident. 2. Mild cardiomegaly without evidence of CHF. 3. Aortic atherosclerosis. Electronically Signed   By: Almira Bar M.D.   On: 06/17/2023 04:52    Micro Results     Recent Results (from the past 240 hours)  Blood Culture (routine x 2)  Status: Abnormal   Collection Time: 06/17/23  4:03 AM   Specimen: BLOOD RIGHT ARM  Result Value Ref Range Status   Specimen Description   Final    BLOOD RIGHT ARM Performed at [facility], [address], [place], [place] [address]    Special Requests   Final    BOTTLES DRAWN AEROBIC AND ANAEROBIC Blood Culture adequate volume Performed at [facility], [address], [place], [place] [address]    Culture  Setup Time   Final    GRAM POSITIVE COCCI IN CHAINS ANAEROBIC BOTTLE ONLY CRITICAL RESULT CALLED TO, READ BACK BY AND VERIFIED WITH: PHARMD G ABBOTT 06/18/2023 @ 0042 BY AB    Culture (A)  Final    GROUP A STREP (S.PYOGENES) ISOLATED HEALTH DEPARTMENT NOTIFIED Performed at [facility], [address], [place], [place] [address]    Report Status 06/20/2023 FINAL  Final   Organism ID, Bacteria GROUP A STREP (S.PYOGENES) ISOLATED  Final      Susceptibility   Group a strep (s.pyogenes) isolated - MIC*    PENICILLIN <=0.06 SENSITIVE Sensitive     CEFTRIAXONE <=0.12 SENSITIVE Sensitive     ERYTHROMYCIN 2 RESISTANT Resistant     LEVOFLOXACIN 0.5 SENSITIVE Sensitive     VANCOMYCIN 0.5 SENSITIVE Sensitive     * GROUP A STREP (S.PYOGENES) ISOLATED  Blood Culture ID Panel (Reflexed)     Status: Abnormal   Collection Time: 06/17/23  4:03 AM  Result Value Ref Range Status   Enterococcus faecalis NOT DETECTED NOT DETECTED Final   Enterococcus Faecium NOT DETECTED NOT DETECTED Final   Listeria monocytogenes NOT DETECTED NOT DETECTED Final   Staphylococcus species NOT DETECTED NOT DETECTED Final   Staphylococcus aureus (BCID) NOT DETECTED NOT DETECTED Final    Staphylococcus epidermidis NOT DETECTED NOT DETECTED Final   Staphylococcus lugdunensis NOT DETECTED NOT DETECTED Final   Streptococcus species DETECTED (A) NOT DETECTED Final    Comment: CRITICAL RESULT CALLED TO, READ BACK BY AND VERIFIED WITH: PHARMD G ABBOTT 06/18/2023 @ 0042 BY AB    Streptococcus agalactiae NOT DETECTED NOT DETECTED Final   Streptococcus pneumoniae NOT DETECTED NOT DETECTED Final   Streptococcus pyogenes DETECTED (A) NOT DETECTED Final    Comment: CRITICAL RESULT CALLED TO, READ BACK BY AND VERIFIED WITH: PHARMD G ABBOTT 06/18/2023 @ 0042 BY AB    A.calcoaceticus-baumannii NOT DETECTED NOT DETECTED Final   Bacteroides fragilis NOT DETECTED NOT DETECTED Final   Enterobacterales NOT DETECTED NOT DETECTED Final   Enterobacter cloacae complex NOT DETECTED NOT DETECTED Final   Escherichia coli NOT DETECTED NOT DETECTED Final   Klebsiella aerogenes NOT DETECTED NOT DETECTED Final   Klebsiella oxytoca NOT DETECTED NOT DETECTED Final   Klebsiella pneumoniae NOT DETECTED NOT DETECTED Final   Proteus species NOT DETECTED NOT DETECTED Final   Salmonella species NOT DETECTED NOT DETECTED Final   Serratia marcescens NOT DETECTED NOT DETECTED Final   Haemophilus influenzae NOT DETECTED NOT DETECTED Final   Neisseria meningitidis NOT DETECTED NOT DETECTED Final   Pseudomonas aeruginosa NOT DETECTED NOT DETECTED Final   Stenotrophomonas maltophilia NOT DETECTED NOT DETECTED Final   Candida albicans NOT DETECTED NOT DETECTED Final   Candida auris NOT DETECTED NOT DETECTED Final   Candida glabrata NOT DETECTED NOT DETECTED Final   Candida krusei NOT DETECTED NOT DETECTED Final   Candida parapsilosis NOT DETECTED NOT DETECTED Final   Candida tropicalis NOT DETECTED NOT DETECTED Final   Cryptococcus neoformans/gattii NOT  DETECTED NOT DETECTED Final    Comment: Performed at [facility], [address], [place], [place] [address]  Blood Culture (routine x 2)     Status:  Abnormal   Collection Time: 06/17/23  4:08 AM   Specimen: BLOOD RIGHT HAND  Result Value Ref Range Status   Specimen Description   Final    BLOOD RIGHT HAND Performed at [facility], [address] [place], [place] [address]    Special Requests   Final    BOTTLES DRAWN AEROBIC AND ANAEROBIC Blood Culture adequate volume Performed at [facility], [address] [place], [place] [address]    Culture  Setup Time   Final    GRAM POSITIVE COCCI IN PAIRS IN CHAINS AEROBIC BOTTLE ONLY CRITICAL VALUE NOTED.  VALUE IS CONSISTENT WITH PREVIOUSLY REPORTED AND CALLED VALUE.    Culture (A)  Final    GROUP A STREP (S.PYOGENES) ISOLATED SUSCEPTIBILITIES PERFORMED ON PREVIOUS CULTURE WITHIN THE LAST 5 DAYS. Performed at [facility], [address] [place], [place] [address]    Report Status 06/20/2023 FINAL  Final  Culture, blood (Routine X 2) w Reflex to ID Panel     Status: None   Collection Time: 06/18/23  6:10 PM   Specimen: BLOOD  Result Value Ref Range Status   Specimen Description BLOOD SITE NOT SPECIFIED  Final   Special Requests   Final    BOTTLES DRAWN AEROBIC AND ANAEROBIC Blood Culture adequate volume   Culture   Final    NO GROWTH 5 DAYS Performed at [facility], [address], [place], [place] [address]    Report Status 06/23/2023 FINAL  Final  Culture, blood (Routine X 2) w Reflex to ID Panel     Status: None   Collection Time: 06/18/23  6:12 PM   Specimen: BLOOD  Result Value Ref Range Status   Specimen Description BLOOD SITE NOT SPECIFIED  Final   Special Requests   Final    BOTTLES DRAWN AEROBIC AND ANAEROBIC Blood Culture results may not be optimal due to an inadequate volume of blood received in culture bottles   Culture   Final    NO GROWTH 5 DAYS Performed at [facility], [address], [place], [place] [address]    Report Status 06/23/2023 FINAL  Final  Culture, blood (Routine X 2) w Reflex to ID  Panel     Status: None   Collection Time: 06/19/23  5:56 AM   Specimen: BLOOD  Result Value Ref Range Status   Specimen Description BLOOD BLOOD LEFT HAND  Final   Special Requests   Final    BOTTLES DRAWN AEROBIC AND ANAEROBIC Blood Culture results may not be optimal due to an inadequate volume of blood received in culture bottles   Culture   Final    NO GROWTH 5 DAYS Performed at [facility], [address] [place], [place] [address]    Report Status 06/24/2023 FINAL  Final  Culture, blood (Routine X 2) w Reflex to ID Panel     Status: None   Collection Time: 06/19/23  5:57 AM   Specimen: BLOOD  Result Value Ref Range Status   Specimen Description BLOOD BLOOD LEFT ARM  Final   Special Requests   Final    BOTTLES DRAWN AEROBIC AND ANAEROBIC Blood Culture results may not be optimal due to an inadequate volume of blood received in culture bottles  Culture   Final    NO GROWTH 5 DAYS Performed at [facility], [address], [place], [place] [address]    Report Status 06/24/2023 FINAL  Final    Today   Subjective    Logan French today has no headache,no chest abdominal pain,no new weakness tingling or numbness, feels much better wants to go home today.    Objective   Blood pressure 122/63, pulse 77, temperature 98.2 F (36.8 C), temperature source Oral, resp. rate 19, height 6' (1.829 m), weight 103.4 kg, SpO2 95%.  No intake or output data in the 24 hours ending 06/25/23 0919  Exam  Awake Alert, No new F.N deficits,    [place].AT,PERRAL Supple Neck,   Symmetrical Chest wall movement, Good air movement bilaterally, CTAB RRR,No Gallops,   +ve B.Sounds, Abd Soft, Non tender,  Right lower extremity almost resolved cellulitis, left knee is examined stable.  Trace to no edema now   Data Review   Recent Labs  Lab 06/19/23 0558 06/20/23 0551 06/21/23 0431 06/22/23 0433 06/23/23 0431  WBC 18.5* 19.1* 15.9* 12.0* 12.9*  HGB 11.4* 11.7* 11.5* 11.5* 11.3*   HCT 34.5* 36.0* 34.4* 34.1* 33.5*  PLT 191 227 230 260 258  MCV 86.3 86.5 85.8 84.6 84.0  MCH 28.5 28.1 28.7 28.5 28.3  MCHC 33.0 32.5 33.4 33.7 33.7  RDW 15.4 15.4 15.2 15.1 15.3  LYMPHSABS 1.3 1.3 1.2 1.1 1.2  MONOABS 1.1* 1.6* 1.3* 0.8 0.9  EOSABS 0.1 0.0 0.0 0.1 0.1  BASOSABS 0.1 0.1 0.0 0.0 0.0    Recent Labs  Lab 06/19/23 0558 06/20/23 0551 06/21/23 0431 06/22/23 0433 06/23/23 0431 06/24/23 0442  NA 132* 130* 127* 125* 127* 129*  K 3.5 4.0 3.9 3.8 3.5 3.9  CL 98 95* 93* 90* 91* 93*  CO2 23 24 22 22 25 24   ANIONGAP 11 11 12 13 11 12   GLUCOSE 129* 137* 129* 130* 129* 104*  BUN 15 19 23 23  25* 22  CREATININE 1.03 1.19 1.07 1.01 1.03 0.94  CRP  --  25.8* 27.6* 27.5* 26.9*  --   PROCALCITON 3.88 2.77 1.50 1.09  --   --   BNP  --  475.0* 546.8* 469.2* 621.9*  --   MG  --  1.7 2.0 2.0 2.0  --   CALCIUM 8.1* 8.2* 7.8* 7.9* 7.8* 8.0*    Total Time in preparing paper work, data evaluation and todays exam - 35 minutes  Signature  -    Susa Raring M.D on 06/25/2023 at 9:19 AM   -  To page go to [email or web address]

## 2023-06-25 NOTE — Progress Notes (Signed)
  Inpatient Rehabilitation Admissions Coordinator   I will make the arrangements to admit to CIR today.  Ottie Glazier, RN, MSN Rehab Admissions Coordinator [phone] 06/25/2023 10:42 AM

## 2023-06-25 NOTE — H&P (Signed)
 Physical Medicine and Rehabilitation Admission H&P    Chief Complaint  Patient presents with   Leg Pain  : HPI: Logan French. [name] is a 89 year old right-handed male with history of atrial fibrillation with cardioversion 10/07/2021 per Dr.Tobb,Kardie, hyperlipidemia, syncope, hypertension, pernicious anemia, prostate cancer, peripheral vascular disease status post angioplasty of the right popliteal artery 05/17/2023 per Dr. Hetty Blend.  Per chart review patient lives with spouse.  Two-level home with bed and bath downstairs.  Independent driving using a cane at times.  Presented for 06/03/2023 for right leg cellulitis and wound .  Patient has stated since recent vascular procedure that wound has not healed the leg remains swollen with increasing pain as per her latest visit with general surgery 06/15/2023.  CT of the right lower extremity showed extensive subcutaneous edema of the right lower extremity asymmetrically more severe along the anterior lateral aspect.  No loculated subcutaneous fluid collection.  The edematous changes appear to be superficial to the investing muscular fascia.  Latest venous Doppler study showed no signs of DVT.  Admission chemistries unremarkable except sodium 134 glucose 134 BUN 26 creatinine 1.42, total bilirubin 1.3, lactic acid 2.8-2.4, WBC 18,900, hemoglobin A1c 6.4 blood cultures group A strep (S.  Pyogenes).  Infectious disease consulted and initially placed on penicillin and Zyvox and since changed to high-dose p.o. amoxicillin 1 g p.o. 3 times daily through 07/04/2023.Marland Kitchen  Latest blood culture showed no growth.  Echocardiogram shows no obvious vegetation.  Vascular surgery as well as Dr. Lajoyce Corners continues to follow wound and plan to be enrolled in venous leg ulcer study..  Wound care dressings as advised no other further plan for surgical intervention.  Hospital course hyponatremia/SIADH and currently maintained on fluid restriction with latest sodium 129.  History of atrial  fibrillation controlled rate maintained on Toprol and currently on Librexa AF trial (comparing Eliquis versus milvexian-factor XI inhibitor).  New diagnosis of diabetes mellitus type 2 hemoglobin A1c 6.4 SSI for now and plan metformin on discharge.  Patient with persistent left knee pain as noted Dopplers negative x-ray showed chronic arthritis placed on trial of colchicine and continue supportive care.  Uric acid levels were normal.  Therapy evaluations completed due to patient decreased functional mobility was admitted for a comprehensive rehab program.  Review of Systems  Constitutional:  Positive for malaise/fatigue. Negative for fever.  HENT:  Negative for hearing loss.   Eyes:  Negative for blurred vision and double vision.  Respiratory:  Negative for cough, shortness of breath and wheezing.   Cardiovascular:  Positive for palpitations and leg swelling.  Gastrointestinal:  Positive for constipation. Negative for heartburn, nausea and vomiting.  Genitourinary:  Positive for urgency. Negative for dysuria and hematuria.  Musculoskeletal:  Positive for myalgias.  Neurological:        History of syncope  All other systems reviewed and are negative.  Past Medical History:  Diagnosis Date   A-fib (HCC)    B12 deficiency    Diverticulosis    Fatigue    History of colonic polyps    Hyperlipemia    Hypertension    Pernicious anemia    Prostate cancer (HCC)    Syncope    Syncope    Past Surgical History:  Procedure Laterality Date   ABDOMINAL AORTOGRAM W/LOWER EXTREMITY N/A 05/17/2023   Procedure: ABDOMINAL AORTOGRAM W/LOWER EXTREMITY;  Surgeon: [name], MD;  Location: [facility] INVASIVE CV LAB;  Service: Cardiovascular;  Laterality: N/A;   CARDIOVERSION N/A 10/07/2021  Procedure: CARDIOVERSION;  Surgeon: Thomasene Ripple, DO;  Location: [facility] ENDOSCOPY;  Service: Cardiovascular;  Laterality: N/A;   IR ANGIO INTRA EXTRACRAN SEL COM CAROTID INNOMINATE BILAT MOD SED  09/10/2017   IR ANGIO  VERTEBRAL SEL SUBCLAVIAN INNOMINATE BILAT MOD SED  09/10/2017   PERIPHERAL INTRAVASCULAR LITHOTRIPSY Right 05/17/2023   Procedure: PERIPHERAL INTRAVASCULAR LITHOTRIPSY;  Surgeon: [name], MD;  Location: [facility] INVASIVE CV LAB;  Service: Cardiovascular;  Laterality: Right;  SFA-POP   PERIPHERAL VASCULAR BALLOON ANGIOPLASTY Right 05/17/2023   Procedure: PERIPHERAL VASCULAR BALLOON ANGIOPLASTY;  Surgeon: [name], MD;  Location: [facility] INVASIVE CV LAB;  Service: Cardiovascular;  Laterality: Right;  SFA-POP   History reviewed. No pertinent family history. Social History:  reports that he has never smoked. He has never used smokeless tobacco. He reports current alcohol use of about 2.0 standard drinks of alcohol per week. He reports that he does not use drugs. Allergies: No Known Allergies Medications Prior to Admission  Medication Sig Dispense Refill   acetaminophen (TYLENOL) 500 MG tablet Take 1,500 mg by mouth daily as needed for moderate pain (pain score 4-6), mild pain (pain score 1-3) or headache.     amLODipine (NORVASC) 5 MG tablet Take 5 mg by mouth daily.      clopidogrel (PLAVIX) 75 MG tablet Take 75 mg by mouth daily.     ezetimibe (ZETIA) 10 MG tablet Take 10 mg by mouth daily.     finasteride (PROSCAR) 5 MG tablet Take 5 mg by mouth daily.     hydrochlorothiazide (MICROZIDE) 12.5 MG capsule TAKE ONE CAPSULE EVERY DAY 90 capsule 2   metoprolol succinate (TOPROL-XL) 25 MG 24 hr tablet TAKE ONE TABLET BY MOUTH EVERY DAY 90 tablet 3   rosuvastatin (CRESTOR) 20 MG tablet TAKE ONE TABLET DAILY 90 tablet 3   STUDY - LIBREXIA-AF - apixaban 5 mg or placebo capsule (PI-Sethi) Take 1 capsule (5 mg total) by mouth 2 (two) times daily. Take at approximately the same time of day with or without food. Please bring bottle back with you to every visit; do not discard bottle. 280 capsule 0   STUDY - LIBREXIA-AF - [id number] (Milvexian) 100 mg or placebo tablet (PI-Sethi) Take 1 tablet by mouth 2  (two) times daily. Take at approximately the same time of day with or without food. Please bring bottle back with you to every visit; do not discard bottle. 280 tablet 0   vitamin B-12 (CYANOCOBALAMIN) 500 MCG tablet Take 500 mcg by mouth daily.         Home: Home Living Family/patient expects to be discharged to:: Private residence Living Arrangements: Spouse/significant other Available Help at Discharge: Family, Available 24 hours/day Type of Home: House Home Access: Stairs to enter [employer] of Steps: "just a couple" Home Layout: Multi-level, Able to live on main level with bedroom/bathroom Alternate Level Stairs-Number of Steps: 2 steps down into kitchen/living room. main level has bedroom/bathroom and an upstairs that pt does not have to use Home Equipment: Gilmer Mor - single point  Lives With: Spouse   Functional History: Prior Function Prior Level of Function : Independent/Modified Independent, Driving Mobility Comments: no AD for mobility ADLs Comments: Indep with ADLs, IADLs  Functional Status:  Mobility: Bed Mobility Overal bed mobility: Needs Assistance Bed Mobility: Supine to Sit Rolling: Mod assist Sidelying to sit: Min assist, Used rails Supine to sit: Mod assist, HOB elevated, Used rails Sit to supine: +2 for physical assistance, Max assist, Total assist, +2 for safety/equipment  General bed mobility comments: able to bring LE 75% to EOB before assist provided to scoot with bed pad. able to use bedrails to pull toward EOB with assist to lift trunk Transfers Overall transfer level: Needs assistance Equipment used: Ambulation equipment used Transfers: Sit to/from Stand Sit to Stand: Mod assist, +2 physical assistance, +2 safety/equipment, Via lift equipment Bed to/from chair/wheelchair/BSC transfer type:: Via Lift equipment Step pivot transfers: Max assist, +2 physical assistance, +2 safety/equipment, From elevated surface Transfer via Lift Equipment:  Stedy General transfer comment: Mod A x 2 to stand from bedside, transfer to recliner via Stedy (with stacked blankets) and also stand again from recliner height. Used bed pad to lift bottom and cued to pull self fully upright with fair carryover noted. Ambulation/Gait General Gait Details: unable at this time.    ADL: ADL Overall ADL's : Needs assistance/impaired Eating/Feeding: Independent Grooming: Set up, Sitting Upper Body Bathing: Set up, Sitting Lower Body Bathing: Maximal assistance, Sit to/from stand Lower Body Bathing Details (indicate cue type and reason): cleaning performed after bowel incontenance Upper Body Dressing : Set up, Sitting Upper Body Dressing Details (indicate cue type and reason): gown Lower Body Dressing: Total assistance, Bed level Lower Body Dressing Details (indicate cue type and reason): assisted for socks Toilet Transfer: Maximal assistance, +2 for physical assistance Toilet Transfer Details (indicate cue type and reason): simulated to recliner Toileting- Clothing Manipulation and Hygiene: Maximal assistance General ADL Comments: Emphasis on OOB attempts, modification of setup/room with OT locating regular recliner and buidling up surface height to improve transfer progression. Cued to work on pointing toes up to ceiling to avoid abnormal posturing with feet elevated in chair (placed rolled blanket as well as do not feel pt would tolerate prafo)  Cognition: Cognition Orientation Level: Oriented X4 Cognition Arousal: Alert Behavior During Therapy: WFL for tasks assessed/performed  Physical Exam: Blood pressure 122/63, pulse 77, temperature 98 F (36.7 C), temperature source Oral, resp. rate 19, height 6' (1.829 m), weight 103.4 kg, SpO2 92%. Physical Exam Constitutional:      Appearance: He is not ill-appearing.     Comments: Persistent hiccups  HENT:     Right Ear: External ear normal.     Left Ear: External ear normal.     Nose: Nose normal.      Mouth/Throat:     Mouth: Mucous membranes are moist.  Eyes:     Comments: Right eye wanders  Cardiovascular:     Rate and Rhythm: Normal rate.     Heart sounds: No murmur heard.    No gallop.  Pulmonary:     Effort: Pulmonary effort is normal. No respiratory distress.     Breath sounds: No wheezing.  Abdominal:     General: There is no distension.     Palpations: Abdomen is soft.     Tenderness: There is no abdominal tenderness.  Musculoskeletal:        General: Swelling present.     Cervical back: Normal range of motion.     Right lower leg: Edema present.     Left lower leg: Edema present.     Comments: Chronic changes to both knees with mild effusions bilaterally.   Skin:    General: Skin is warm.     Comments: Right lower extremity wound measures about 1.5x 4cm in diameter and is generally pink and dry. 4x4 gauze, kerlix, ACE wrap in place with PRAFO. Some redness along right lateral leg which appears mostly due to external rotation of  leg/contact with bed. Old knee incision from ACL repair right.   Neurological:     Mental Status: He is alert.     Comments: Alert and oriented x 3. Normal insight and awareness. Intact Memory. Normal language and speech. Cranial nerve exam unremarkable except for dysconjugate gaze. MMT: BUE 4 to 4+/5. RLE 2-3/5 HF, KE and 4-/5 ADF/PF. LLE 3/5 HF, KE and 4+/5 ADF/PF. Sensory exam normal for light touch and pain in all 4 limbs. No limb ataxia or cerebellar signs. No abnormal tone appreciated.  Marland Kitchen       Psychiatric:        Mood and Affect: Mood normal.        Behavior: Behavior normal.     Results for orders placed or performed during the hospital encounter of 06/17/23 (from the past 48 hours)  Glucose, capillary     Status: Abnormal   Collection Time: 06/23/23  8:49 AM  Result Value Ref Range   Glucose-Capillary 172 (H) 70 - 99 mg/dL    Comment: Glucose reference range applies only to samples taken after fasting for at least 8 hours.   Glucose, capillary     Status: Abnormal   Collection Time: 06/23/23 12:48 PM  Result Value Ref Range   Glucose-Capillary 134 (H) 70 - 99 mg/dL    Comment: Glucose reference range applies only to samples taken after fasting for at least 8 hours.  Glucose, capillary     Status: Abnormal   Collection Time: 06/23/23  4:25 PM  Result Value Ref Range   Glucose-Capillary 110 (H) 70 - 99 mg/dL    Comment: Glucose reference range applies only to samples taken after fasting for at least 8 hours.  Glucose, capillary     Status: Abnormal   Collection Time: 06/23/23  9:18 PM  Result Value Ref Range   Glucose-Capillary 125 (H) 70 - 99 mg/dL    Comment: Glucose reference range applies only to samples taken after fasting for at least 8 hours.  Basic metabolic panel with GFR     Status: Abnormal   Collection Time: 06/24/23  4:42 AM  Result Value Ref Range   Sodium 129 (L) 135 - 145 mmol/L   Potassium 3.9 3.5 - 5.1 mmol/L   Chloride 93 (L) 98 - 111 mmol/L   CO2 24 22 - 32 mmol/L   Glucose, Bld 104 (H) 70 - 99 mg/dL    Comment: Glucose reference range applies only to samples taken after fasting for at least 8 hours.   BUN 22 8 - 23 mg/dL   Creatinine, Ser 7.82 0.61 - 1.24 mg/dL   Calcium 8.0 (L) 8.9 - 10.3 mg/dL   GFR, Estimated >95 >62 mL/min    Comment: (NOTE) Calculated using the CKD-EPI Creatinine Equation (2021)    Anion gap 12 5 - 15    Comment: Performed at [facility], [address] [place], [place] [address]  Glucose, capillary     Status: Abnormal   Collection Time: 06/24/23  8:29 AM  Result Value Ref Range   Glucose-Capillary 101 (H) 70 - 99 mg/dL    Comment: Glucose reference range applies only to samples taken after fasting for at least 8 hours.  Glucose, capillary     Status: Abnormal   Collection Time: 06/24/23 12:59 PM  Result Value Ref Range   Glucose-Capillary 155 (H) 70 - 99 mg/dL    Comment: Glucose reference range applies only to samples taken after fasting for  at least  8 hours.  Glucose, capillary     Status: Abnormal   Collection Time: 06/24/23  4:47 PM  Result Value Ref Range   Glucose-Capillary 124 (H) 70 - 99 mg/dL    Comment: Glucose reference range applies only to samples taken after fasting for at least 8 hours.  Glucose, capillary     Status: Abnormal   Collection Time: 06/24/23  9:21 PM  Result Value Ref Range   Glucose-Capillary 155 (H) 70 - 99 mg/dL    Comment: Glucose reference range applies only to samples taken after fasting for at least 8 hours.   No results found.    Blood pressure 122/63, pulse 77, temperature 98 F (36.7 C), temperature source Oral, resp. rate 19, height 6' (1.829 m), weight 103.4 kg, SpO2 92%.  Medical Problem List and Plan: 1. 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  Antithrombotics: -DVT/anticoagulation:  Mechanical: Antiembolism stockings, thigh (TED hose) Bilateral lower extremities  -antiplatelet therapy: Plavix 75 mg daily 3. Pain Management: Voltaren gel 4 times daily oxycodone as needed, tramadol as needed  -might benefit from additional support (brace/sleeve) to knees. 4. Mood/Behavior/Sleep: Provide emotional support  -antipsychotic agents: N/A 5. Neuropsych/cognition: This patient is capable of making decisions on his own behalf. 6. Skin/Wound Care:   -4 x 4 gauze dampened with Vashe to the right Achilles ulcer with kerlix and Ace wrap from the metatarsal heads up to the tubercle.   -PRAFO right foot -I reapplied the dressing today 7. Fluids/Electrolytes/Nutrition: Routine INO's with follow-up chemistries 8.  Left knee pain.  Lower extremity Dopplers negative.  X-ray showed chronic arthritis.  Trial of colchicine.  Uric  acid normal limits 9.  Hyponatremia/SIADH.  Fluid restriction.  Follow-up chemistries on Monday 10.  ID/bacteremia.  Was on combination of IV penicillin and Zyvox stopped 06/21/2023 changed to p.o. amoxicillin with stop date 07/04/2023 11.  Atrial fibrillation.  Rate controlled with oral beta-blocker/Toprol-XL 25 mg every 12 hours.Librexa AF trial drug (comparing Eliquis versus milvexian-factor XI inhibitor) 12.  New diagnosis of diabetes mellitus-2.  A1c 6.4.  Currently on SSI.  Plan metformin on discharge.  -reasonable control at present 13.  Hypertension.  Continue metoprolol.  HCTZ currently on hold. 14.  History of prostate cancer.  Pro scar 5 mg daily 15.  Hyperlipidemia.  Zetia/Crestor 16.  Constipation. Currently on MiraLAX daily, Colace 100 mg twice daily  -last bm 4/6  -pt admittedly is not eating much  -change colace to senna-s bid 17.  Hiccups.  Patient had been receiving Thorazine however very sedating.   -change to  baclofen 5mg  q6 prn to start.  Mcarthur Rossetti Angiulli, PA-C 06/25/2023

## 2023-06-25 NOTE — Discharge Instructions (Addendum)
 Follow with Primary MD Garlan Fillers, MD in 7 days   Get CBC, CMP, 2 view Chest X ray -  checked next visit with your primary MD or CIR MD    Activity: As tolerated with Full fall precautions use walker/cane & assistance as needed  Disposition CIR  Diet: Heart Healthy low carbohydrate diet with strict 1.5 L fluid restriction per day, check CBGs q. [facility] S  Special Instructions: If you have smoked or chewed Tobacco  in the last 2 yrs please stop smoking, stop any regular Alcohol  and or any Recreational drug use.  On your next visit with your primary care physician please Get Medicines reviewed and adjusted.  Please request your Prim.MD to go over all Hospital Tests and Procedure/Radiological results at the follow up, please get all Hospital records sent to your Prim MD by signing hospital release before you go home.  If you experience worsening of your admission symptoms, develop shortness of breath, life threatening emergency, suicidal or homicidal thoughts you must seek medical attention immediately by calling 911 or calling your MD immediately  if symptoms less severe.  You Must read complete instructions/literature along with all the possible adverse reactions/side effects for all the Medicines you take and that have been prescribed to you. Take any new Medicines after you have completely understood and accpet all the possible adverse reactions/side effects.   Do not drive when taking Pain medications.  Do not take more than prescribed Pain, Sleep and Anxiety Medications  Wear Seat belts while driving.

## 2023-06-25 NOTE — Plan of Care (Signed)
 Wound Plan     Wounds present:  WOC Nurse Consult Note: patient followed at [facility] for R leg wound r/t venous insufficiency; has been admitted for cellulitis of this leg;  last visit at [facility] patient switched to Bactroban ointment and  Prisma (a collagen dressing not on formulary at [facility]); will use Aquacel AG as substitute   Reason for Consult: R lower leg wound  Wound type: full thickness r/t venous insufficiency  Pressure Injury POA: NA  Measurement: see nursing flowsheet  Wound bed:75% tan yellow 25% pink  Drainage (amount, consistency, odor) see nursing flowsheet  Periwound: edema, erythema, hyperkeratotic skin   Interventions:  Dressing procedure/placement/frequency: Cleanse R lower leg (intact skin and wound) with Vashe wound cleanser, do not rinse and allow to air dry.   Apply Bactroban to wound bed daily, cover with silver hydrofiber [place] [phone]) cut to fit wound bed, cover with dry gauze/ABD pad.   Secure dressing with Kerlix roll gauze beginning right above toes and ending right below knee.   Apply Ace bandage wrapped in same fashion as Kerlix for light compression.  Patient should elevate leg as much as possible.   HH/CMM diet 1200 CC FR Nepro BID tween meals Juven BID tween meals  Braden Score: 16 Sensory: 4 Moisture: 3 Activity: 2 Mobility: 2 Nutrition: 3 Friction: 2  Contributors: Heather Bullins MSN, RN-BC, CWOCN  Chana Bode, RN-BC, CRRN

## 2023-06-26 DIAGNOSIS — R7881 Bacteremia: Secondary | ICD-10-CM | POA: Diagnosis not present

## 2023-06-26 LAB — GLUCOSE, CAPILLARY
Glucose-Capillary: 112 mg/dL — ABNORMAL HIGH (ref 70–99)
Glucose-Capillary: 151 mg/dL — ABNORMAL HIGH (ref 70–99)
Glucose-Capillary: 114 mg/dL — ABNORMAL HIGH (ref 70–99)
Glucose-Capillary: 144 mg/dL — ABNORMAL HIGH (ref 70–99)

## 2023-06-26 LAB — VITAMIN D 25 HYDROXY (VIT D DEFICIENCY, FRACTURES): Vit D, 25-Hydroxy: 27.89 ng/mL — ABNORMAL LOW (ref 30–100)

## 2023-06-26 MED ORDER — BACLOFEN 5 MG HALF TABLET: 5.0000 mg | ORAL_TABLET | Freq: Three times a day (TID) | ORAL | Status: DC | PRN

## 2023-06-26 NOTE — Evaluation (Signed)
 Occupational Therapy Assessment and Plan  Patient Details  Name: Logan French MRN: [id number] Date of Birth: Jun 03, 1934  OT Diagnosis: abnormal posture, acute pain, pain in joint, and swelling of limb Rehab Potential: Rehab Potential (ACUTE ONLY): Good ELOS: 10-12 days   Today's Date: 06/26/2023 OT Individual Time: 4098-1191 OT Individual Time Calculation (min): 70 min     Hospital Problem: Principal Problem:   Bacteremia   Past Medical History:  Past Medical History:  Diagnosis Date   A-fib (HCC)    B12 deficiency    Diverticulosis    Fatigue    History of colonic polyps    Hyperlipemia    Hypertension    Pernicious anemia    Prostate cancer (HCC)    Syncope    Syncope    Past Surgical History:  Past Surgical History:  Procedure Laterality Date   ABDOMINAL AORTOGRAM W/LOWER EXTREMITY N/A 05/17/2023   Procedure: ABDOMINAL AORTOGRAM W/LOWER EXTREMITY;  Surgeon: Philipp Brawn, MD;  Location: [facility] INVASIVE CV LAB;  Service: Cardiovascular;  Laterality: N/A;   CARDIOVERSION N/A 10/07/2021   Procedure: CARDIOVERSION;  Surgeon: Jerryl Morin, DO;  Location: [facility] ENDOSCOPY;  Service: Cardiovascular;  Laterality: N/A;   IR ANGIO INTRA EXTRACRAN SEL COM CAROTID INNOMINATE BILAT MOD SED  09/10/2017   IR ANGIO VERTEBRAL SEL SUBCLAVIAN INNOMINATE BILAT MOD SED  09/10/2017   PERIPHERAL INTRAVASCULAR LITHOTRIPSY Right 05/17/2023   Procedure: PERIPHERAL INTRAVASCULAR LITHOTRIPSY;  Surgeon: Philipp Brawn, MD;  Location: [facility] INVASIVE CV LAB;  Service: Cardiovascular;  Laterality: Right;  SFA-POP   PERIPHERAL VASCULAR BALLOON ANGIOPLASTY Right 05/17/2023   Procedure: PERIPHERAL VASCULAR BALLOON ANGIOPLASTY;  Surgeon: Philipp Brawn, MD;  Location: [facility] INVASIVE CV LAB;  Service: Cardiovascular;  Laterality: Right;  SFA-POP    Assessment & Plan Clinical Impression:  Logan French. Logan Matas. is a 89 year old right-handed male with history of atrial fibrillation with cardioversion 10/07/2021 per  Dr.Tobb,Kardie, hyperlipidemia, syncope, hypertension, pernicious anemia, prostate cancer, peripheral vascular disease status post angioplasty of the right popliteal artery 05/17/2023 per Dr. Susi Eric. Per chart review patient lives with spouse. Two-level home with bed and bath downstairs. Independent driving using a cane at times. Presented for 06/03/2023 for right leg cellulitis and wound . Patient has stated since recent vascular procedure that wound has not healed the leg remains swollen with increasing pain as per her latest visit with general surgery 06/15/2023. CT of the right lower extremity showed extensive subcutaneous edema of the right lower extremity asymmetrically more severe along the anterior lateral aspect. No loculated subcutaneous fluid collection. The edematous changes appear to be superficial to the investing muscular fascia. Latest venous Doppler study showed no signs of DVT. Admission chemistries unremarkable except sodium 134 glucose 134 BUN 26 creatinine 1.42, total bilirubin 1.3, lactic acid 2.8-2.4, WBC 18,900, hemoglobin A1c 6.4 blood cultures group A strep (S. Pyogenes). Infectious disease consulted and initially placed on penicillin and Zyvox and since changed to high-dose p.o. amoxicillin 1 g p.o. 3 times daily through 07/04/2023.Logan French Latest blood culture showed no growth. Echocardiogram shows no obvious vegetation. Vascular surgery as well as Dr. Julio Ohm continues to follow wound and plan to be enrolled in venous leg ulcer study.. Wound care dressings as advised no other further plan for surgical intervention. Hospital course hyponatremia/SIADH and currently maintained on fluid restriction with latest sodium 129. History of atrial fibrillation controlled rate maintained on Toprol and currently on Librexa AF trial (comparing Eliquis versus milvexian-factor XI inhibitor). New diagnosis of diabetes mellitus type 2  hemoglobin A1c 6.4 SSI for now and plan metformin on discharge. Patient with persistent  left knee pain as noted Dopplers negative x-ray showed chronic arthritis placed on trial of colchicine and continue supportive care. Uric acid levels were normal. Therapy evaluations completed due to patient decreased functional mobility was admitted for a comprehensive rehab program. Patient transferred to CIR on 06/25/2023 .    Patient currently requires min to max A with basic self-care skills secondary to muscle weakness, decreased cardiorespiratoy endurance, decreased coordination, decreased initiation, and decreased standing balance and decreased balance strategies.  Prior to hospitalization, patient could complete self-care independently.  Patient will benefit from skilled intervention to decrease level of assist with basic self-care skills and increase independence with basic self-care skills prior to discharge home with care partner.  Anticipate patient will require intermittent supervision and follow up home health.  OT - End of Session Activity Tolerance: Tolerates < 10 min activity, no significant change in vital signs Endurance Deficit: Yes Endurance Deficit Description: mild SOB and expressed fatigue with functional mobility OT Assessment Rehab Potential (ACUTE ONLY): Good OT Barriers to Discharge: Inaccessible home environment;Home environment access/layout;Incontinence;Wound Care OT Patient demonstrates impairments in the following area(s): Balance;Edema;Endurance;Motor;Pain OT Basic ADL's Functional Problem(s): Bathing;Dressing;Toileting;Grooming OT Transfers Functional Problem(s): Toilet;Tub/Shower OT Additional Impairment(s): None OT Plan OT Intensity: Minimum of 1-2 x/day, 45 to 90 minutes OT Frequency: 5 out of 7 days OT Duration/Estimated Length of Stay: 10-12 days OT Treatment/Interventions: Balance/vestibular training;Discharge planning;Pain management;Therapeutic Activities;Self Care/advanced ADL retraining;UE/LE Coordination activities;Disease  mangement/prevention;Functional mobility training;Patient/family education;Skin care/wound managment;Therapeutic Exercise;DME/adaptive equipment instruction;Neuromuscular re-education;Splinting/orthotics;UE/LE Strength taining/ROM;Wheelchair propulsion/positioning OT Self Feeding Anticipated Outcome(s): Mod I OT Basic Self-Care Anticipated Outcome(s): Mod I, supervision OT Toileting Anticipated Outcome(s): Mod I OT Bathroom Transfers Anticipated Outcome(s): Mod I OT Recommendation Recommendations for Other Services: Therapeutic Recreation consult Therapeutic Recreation Interventions: Pet therapy Patient destination: Home Follow Up Recommendations: 24 hour supervision/assistance Equipment Recommended: To be determined   OT Evaluation Precautions/Restrictions  Precautions Precautions: Fall Precaution/Restrictions Comments: chronic bilateral knee pain Other Brace: PRAFO RLE when in bed Restrictions Weight Bearing Restrictions Per Provider Order: No Home Living/Prior Functioning Home Living Family/patient expects to be discharged to:: Private residence Living Arrangements: Spouse/significant other Available Help at Discharge: Family, Available 24 hours/day Type of Home: House Home Access: Stairs to enter [employer] of Steps: 3 STE front entry with R ledge vs rail Home Layout: Multi-level, Able to live on main level with bedroom/bathroom Alternate Level Stairs-Number of Steps: 2 steps down into kitchen/living room. main level has bedroom/bathroom and an upstairs that pt does not have to use Bathroom Shower/Tub: Psychologist, counselling, Sport and exercise psychologist: Standard Bathroom Accessibility: Yes Additional Comments: Has standard walker, SPC  Lives With: Spouse IADL History Homemaking Responsibilities: No Current License: Yes Occupation: Retired, Part time employment Type of Occupation: Retired Pensions consultant, however still handling a few cases Leisure and Hobbies: Working in  yard Prior Function Level of Independence: Independent with basic ADLs, Independent with gait, Independent with transfers, Independent with homemaking with ambulation  Able to Take Stairs?: Yes Driving: Yes Vision Baseline Vision/History: 1 Wears glasses Ability to See in Adequate Light: 1 Impaired Patient Visual Report: No change from baseline Vision Assessment?: No apparent visual deficits Perception  Perception: Within Functional Limits Praxis Praxis: WFL Cognition Cognition Overall Cognitive Status: Within Functional Limits for tasks assessed Arousal/Alertness: Awake/alert Orientation Level: Person;Place;Situation Person: Oriented Place: Oriented Situation: Oriented Memory: Appears intact Awareness: Appears intact Problem Solving: Appears intact Safety/Judgment: Appears intact Brief Interview for Mental Status (BIMS) Repetition  of Three Words (First Attempt): 3 Temporal Orientation: Year: Correct Temporal Orientation: Month: Accurate within 5 days Temporal Orientation: Day: Correct Recall: "Sock": Yes, no cue required Recall: "Blue": Yes, no cue required Recall: "Bed": Yes, no cue required BIMS Summary Score: 15 Sensation Sensation Light Touch: Appears Intact Hot/Cold: Not tested Proprioception: Appears Intact Stereognosis: Not tested Coordination Gross Motor Movements are Fluid and Coordinated: No Fine Motor Movements are Fluid and Coordinated: No Finger Nose Finger Test: Mild dysmetria bilaterally Motor  Motor Motor: Within Functional Limits  Trunk/Postural Assessment  Cervical Assessment Cervical Assessment: Exceptions to [facility] (forward head) Thoracic Assessment Thoracic Assessment: Exceptions to [facility] (thoracic rounding) Lumbar Assessment Lumbar Assessment: Exceptions to [facility] (posterior pelvic tilt) Postural Control Postural Control: Deficits on evaluation Righting Reactions: delayed Protective Responses: inadequate  Balance Balance Balance Assessed:  Yes Static Sitting Balance Static Sitting - Balance Support: Feet supported;No upper extremity supported Static Sitting - Level of Assistance: 5: Stand by assistance (supervision) Dynamic Sitting Balance Dynamic Sitting - Balance Support: Feet supported;No upper extremity supported Dynamic Sitting - Level of Assistance: 5: Stand by assistance (CGA) Static Standing Balance Static Standing - Balance Support: Bilateral upper extremity supported;During functional activity (RW) Static Standing - Level of Assistance: 5: Stand by assistance (CGA) Dynamic Standing Balance Dynamic Standing - Balance Support: During functional activity;Bilateral upper extremity supported (RW) Dynamic Standing - Level of Assistance: 4: Min assist Extremity/Trunk Assessment RUE Assessment RUE Assessment: Within Functional Limits LUE Assessment LUE Assessment: Within Functional Limits  Care Tool Care Tool Self Care Eating   Eating Assist Level: Set up assist    Oral Care    Oral Care Assist Level: Supervision/Verbal cueing    Bathing   Body parts bathed by patient: Right arm;Left arm;Chest;Abdomen;Front perineal area;Right upper leg;Left upper leg;Right lower leg;Left lower leg;Face Body parts bathed by helper: Buttocks   Assist Level: Minimal Assistance - Patient > 75%    Upper Body Dressing(including orthotics)   What is the patient wearing?: Pull over shirt   Assist Level: Supervision/Verbal cueing    Lower Body Dressing (excluding footwear)   What is the patient wearing?: Incontinence brief;Pants Assist for lower body dressing: Maximal Assistance - Patient 25 - 49%    Putting on/Taking off footwear   What is the patient wearing?: Non-skid slipper socks Assist for footwear: Dependent - Patient 0%       Care Tool Toileting Toileting activity   Assist for toileting: Maximal Assistance - Patient 25 - 49%     Care Tool Bed Mobility Roll left and right activity        Sit to lying activity         Lying to sitting on side of bed activity   Lying to sitting on side of bed assist level: the ability to move from lying on the back to sitting on the side of the bed with no back support.: Moderate Assistance - Patient 50 - 74%     Care Tool Transfers Sit to stand transfer   Sit to stand assist level: Moderate Assistance - Patient 50 - 74%    Chair/bed transfer         Toilet transfer   Assist Level: Minimal Assistance - Patient > 75%     Care Tool Cognition  Expression of Ideas and Wants Expression of Ideas and Wants: 3. Some difficulty - exhibits some difficulty with expressing needs and ideas (e.g, some words or finishing thoughts) or speech is not clear  Understanding Verbal and Non-Verbal Content  Understanding Verbal and Non-Verbal Content: 3. Usually understands - understands most conversations, but misses some part/intent of message. Requires cues at times to understand   Memory/Recall Ability Memory/Recall Ability : Current season;That he or she is in a hospital/hospital unit   Refer to Care Plan for Long Term Goals  SHORT TERM GOAL WEEK 1 OT Short Term Goal 1 (Week 1): Pt will complete sit > stand in prep for ADL with CGA using LRAD OT Short Term Goal 2 (Week 1): Pt will engage in ADL task at sink in stance for 3 mins with min A to promote standing tolerance OT Short Term Goal 3 (Week 1): Pt will utilize AE PRN to thread LB clothing with supervision  Recommendations for other services: Therapeutic Recreation  Pet therapy   Skilled Therapeutic Intervention Patient received upright in bed upon therapy arrival and agreeable to participate in OT evaluation. Education provided on OT purpose, therapy schedule, goals for therapy, and safety policy while in rehab. 8/10 pain reported in bilateral knees with functional transfers; nurse notified of pain med request however did apply voltaren gel to both knees for pain management. OT offered rest breaks, repositioning for pain  reduction.  Patient demonstrates high pain levels with mobility, dynamic standing balance, functional endurance and GM coordination deficits resulting in difficulty completing BADL tasks without increased physical assist. Cues needed throughout for sequencing and initiation. Pt will benefit from skilled OT services to focus on mentioned deficits. See below for ADL and functional transfer performance. ADLs completed at EOB and sink side with +2 present for safety however did not use for transfers. Pt remained seated in w/c at conclusion of session with belt alarm on and all needs met at end of session.    ADL ADL Eating: Set up Where Assessed-Eating: Wheelchair Grooming: Supervision/safety Where Assessed-Grooming: Sitting at sink Upper Body Bathing: Supervision/safety Where Assessed-Upper Body Bathing: Edge of bed Lower Body Bathing: Minimal assistance Where Assessed-Lower Body Bathing: Edge of bed Upper Body Dressing: Supervision/safety Where Assessed-Upper Body Dressing: Edge of bed Lower Body Dressing: Maximal assistance Where Assessed-Lower Body Dressing: Edge of bed Toileting: Maximal assistance Where Assessed-Toileting: Bedside Commode Toilet Transfer: Minimal assistance Toilet Transfer Method: Stand pivot Toilet Transfer Equipment: Bedside commode;Other (comment) (RW) Tub/Shower Transfer: Unable to assess Tub/Shower Transfer Method: Unable to assess Film/video editor: Unable to assess Film/video editor Method: Unable to assess Mobility  Bed Mobility Bed Mobility: Left Sidelying to Sit Left Sidelying to Sit: Moderate Assistance - Patient 50-74% Transfers Sit to Stand: Minimal Assistance - Patient > 75% Stand to Sit: Minimal Assistance - Patient > 75%   Discharge Criteria: Patient will be discharged from OT if patient refuses treatment 3 consecutive times without medical reason, if treatment goals not met, if there is a change in medical status, if patient makes  no progress towards goals or if patient is discharged from hospital.  The above assessment, treatment plan, treatment alternatives and goals were discussed and mutually agreed upon: by patient  Ruthanna Covert, MS, OTR/L  06/26/2023, 12:14 PM

## 2023-06-26 NOTE — Plan of Care (Signed)
  Problem: RH BLADDER ELIMINATION Goal: RH STG MANAGE BLADDER WITH ASSISTANCE Description: STG Manage Bladder With mdo I  Assistance Outcome: Progressing   Problem: RH SKIN INTEGRITY Goal: RH STG MAINTAIN SKIN INTEGRITY WITH ASSISTANCE Description: STG Maintain Skin Integrity With min Assistance. Outcome: Progressing   Problem: RH SAFETY Goal: RH STG ADHERE TO SAFETY PRECAUTIONS W/ASSISTANCE/DEVICE Description: STG Adhere to Safety Precautions With cues Assistance/Device. Outcome: Progressing   Problem: RH PAIN MANAGEMENT Goal: RH STG PAIN MANAGED AT OR BELOW PT'S PAIN GOAL Description: < 4 with prns Outcome: Progressing

## 2023-06-26 NOTE — Progress Notes (Signed)
 PROGRESS NOTE   Subjective/Complaints: C/o creakiness in joints, voltaren gel is being applied, he does have OA, discussed knee sleeves Na 129 yesterday, repeat Monday  ROS: +creakiness in his joints Objective:   No results found. No results for input(s): "WBC", "HGB", "HCT", "PLT" in the last 72 hours. Recent Labs    06/24/23 0442  NA 129*  K 3.9  CL 93*  CO2 24  GLUCOSE 104*  BUN 22  CREATININE 0.94  CALCIUM 8.0*    Intake/Output Summary (Last 24 hours) at 06/26/2023 1427 Last data filed at 06/26/2023 0952 Gross per 24 hour  Intake --  Output 1400 ml  Net -1400 ml        Physical Exam: Vital Signs Blood pressure 130/78, pulse 82, temperature 98 F (36.7 C), resp. rate 16, height 6' (1.829 m), weight 103 kg, SpO2 99%.  Physical Exam Constitutional:      Appearance: He is not ill-appearing.     Comments: Persistent hiccups  HENT:     Right Ear: External ear normal.     Left Ear: External ear normal.     Nose: Nose normal.     Mouth/Throat:     Mouth: Mucous membranes are moist.  Eyes:     Comments: Right eye wanders  Cardiovascular:     Rate and Rhythm: Normal rate.     Heart sounds: No murmur heard.    No gallop.  Pulmonary:     Effort: Pulmonary effort is normal. No respiratory distress.     Breath sounds: No wheezing.  Abdominal:     General: There is no distension.     Palpations: Abdomen is soft.     Tenderness: There is no abdominal tenderness.  Musculoskeletal:        General: Swelling present.     Cervical back: Normal range of motion.     Right lower leg: Edema present.     Left lower leg: Edema present.     Comments: Chronic changes to both knees with mild effusions bilaterally.   Skin:    General: Skin is warm.     Comments: Right lower extremity wound measures about 1.5x 4cm in diameter and is generally pink and dry. 4x4 gauze, kerlix, ACE wrap in place with PRAFO. Some redness  along right lateral leg which appears mostly due to external rotation of leg/contact with bed. Old knee incision from ACL repair right.   Neurological:     Mental Status: He is alert.     Comments: Alert and oriented x 3. Normal insight and awareness. Intact Memory. Normal language and speech. Cranial nerve exam unremarkable except for dysconjugate gaze. MMT: BUE 4 to 4+/5. RLE 2-3/5 HF, KE and 4-/5 ADF/PF. LLE 3/5 HF, KE and 4+/5 ADF/PF. Sensory exam normal for light touch and pain in all 4 limbs. No limb ataxia or cerebellar signs. No abnormal tone appreciated.  Stable 4/12      Psychiatric:        Mood and Affect: Mood normal.        Behavior: Behavior normal.   Assessment/Plan: 1. Functional deficits which require 3+ hours per day of interdisciplinary therapy in a comprehensive inpatient  rehab setting. Physiatrist is providing close team supervision and 24 hour management of active medical problems listed below. Physiatrist and rehab team continue to assess barriers to discharge/monitor patient progress toward functional and medical goals  Care Tool:  Bathing    Body parts bathed by patient: Right arm, Left arm, Chest, Abdomen, Front perineal area, Right upper leg, Left upper leg, Right lower leg, Left lower leg, Face   Body parts bathed by helper: Buttocks     Bathing assist Assist Level: Minimal Assistance - Patient > 75%     Upper Body Dressing/Undressing Upper body dressing   What is the patient wearing?: Pull over shirt    Upper body assist Assist Level: Supervision/Verbal cueing    Lower Body Dressing/Undressing Lower body dressing      What is the patient wearing?: Incontinence brief, Pants     Lower body assist Assist for lower body dressing: Maximal Assistance - Patient 25 - 49%     Toileting Toileting    Toileting assist Assist for toileting: Maximal Assistance - Patient 25 - 49%     Transfers Chair/bed transfer  Transfers assist     Chair/bed  transfer assist level: Minimal Assistance - Patient > 75%     Locomotion Ambulation   Ambulation assist      Assist level: 2 helpers (min +W/C follow) Assistive device: Walker-rolling Max distance: 20 ft   Walk 10 feet activity   Assist     Assist level: 2 helpers Assistive device: Walker-rolling   Walk 50 feet activity   Assist Walk 50 feet with 2 turns activity did not occur: Safety/medical concerns         Walk 150 feet activity   Assist Walk 150 feet activity did not occur: Safety/medical concerns         Walk 10 feet on uneven surface  activity   Assist Walk 10 feet on uneven surfaces activity did not occur: Safety/medical concerns         Wheelchair     Assist Is the patient using a wheelchair?: Yes Type of Wheelchair: Manual    Wheelchair assist level: Supervision/Verbal cueing Max wheelchair distance: 150 ft    Wheelchair 50 feet with 2 turns activity    Assist        Assist Level: Supervision/Verbal cueing   Wheelchair 150 feet activity     Assist      Assist Level: Supervision/Verbal cueing   Blood pressure 130/78, pulse 82, temperature 98 F (36.7 C), resp. rate 16, height 6' (1.829 m), weight 103 kg, SpO2 99%.  Medical Problem List and Plan: 1. 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  Antithrombotics: -DVT/anticoagulation:  Mechanical: Antiembolism stockings, thigh (TED hose) Bilateral lower extremities             -antiplatelet therapy: Plavix 75 mg daily 3. Pain Management: Voltaren gel 4 times daily oxycodone as needed, tramadol as needed             -might benefit from additional support (brace/sleeve) to  knees. 4. Mood/Behavior/Sleep: Provide emotional support             -antipsychotic agents: N/A  5. Neuropsych/cognition: This patient is capable of making decisions on his own behalf. 6. Skin/Wound Care:   -4 x 4 gauze dampened with Vashe to the right Achilles ulcer with kerlix and Ace wrap from the metatarsal heads up to the tubercle.   -PRAFO right foot -I reapplied the dressing today 7. Fluids/Electrolytes/Nutrition: Routine INO's with follow-up chemistries 8.  Left knee pain.  Lower extremity Dopplers negative.  X-ray showed chronic arthritis.  Trial of colchicine.  Uric acid normal limits 9.  Hyponatremia/SIADH.  Fluid restriction.  Follow-up chemistries on Monday 10.  ID/bacteremia.  Was on combination of IV penicillin and Zyvox stopped 06/21/2023 changed to p.o. amoxicillin with stop date 07/04/2023 11.  Atrial fibrillation.  Rate controlled with oral beta-blocker/Toprol-XL 25 mg every 12 hours.Librexa AF trial drug (comparing Eliquis versus milvexian-factor XI inhibitor) 12.  New diagnosis of diabetes mellitus-2.  A1c 6.4.  Currently on SSI.  Plan metformin on discharge.             -reasonable control at present 13.  Hypertension.  Continue metoprolol.  HCTZ currently on hold. 14.  History of prostate cancer.  Pro scar 5 mg daily 15.  Hyperlipidemia.  Zetia/Crestor 16.  Constipation. D/c miralax, Colace 100 mg twice daily             -last bm 4/6             -pt admittedly is not eating much             -change colace to senna-s bid  17.  Hiccups.  Patient had been receiving Thorazine however very sedating.   Decrease baclofen to q8H prn  18. Bilateral knee OA: neoprene knee sleeves ordered  19. Screening for vitamin D deficiency: check vitamin D level today  LOS: 1 days A FACE TO FACE EVALUATION WAS PERFORMED  Logan French P Lonni Dirden 06/26/2023, 2:27 PM

## 2023-06-26 NOTE — Plan of Care (Signed)
  Problem: RH Balance Goal: LTG Patient will maintain dynamic standing with ADLs (OT) Description: LTG:  Patient will maintain dynamic standing balance with assist during activities of daily living (OT)  Flowsheets (Taken 06/26/2023 1042) LTG: Pt will maintain dynamic standing balance during ADLs with: Independent with assistive device   Problem: Sit to Stand Goal: LTG:  Patient will perform sit to stand in prep for activites of daily living with assistance level (OT) Description: LTG:  Patient will perform sit to stand in prep for activites of daily living with assistance level (OT) Flowsheets (Taken 06/26/2023 1042) LTG: PT will perform sit to stand in prep for activites of daily living with assistance level: Independent with assistive device   Problem: RH Grooming Goal: LTG Patient will perform grooming w/assist,cues/equip (OT) Description: LTG: Patient will perform grooming with assist, with/without cues using equipment (OT) Flowsheets (Taken 06/26/2023 1042) LTG: Pt will perform grooming with assistance level of: Independent with assistive device    Problem: RH Bathing Goal: LTG Patient will bathe all body parts with assist levels (OT) Description: LTG: Patient will bathe all body parts with assist levels (OT) Flowsheets (Taken 06/26/2023 1042) LTG: Pt will perform bathing with assistance level/cueing: Supervision/Verbal cueing LTG: Position pt will perform bathing:  Shower  At sink   Problem: RH Dressing Goal: LTG Patient will perform upper body dressing (OT) Description: LTG Patient will perform upper body dressing with assist, with/without cues (OT). Flowsheets (Taken 06/26/2023 1042) LTG: Pt will perform upper body dressing with assistance level of: Independent with assistive device Goal: LTG Patient will perform lower body dressing w/assist (OT) Description: LTG: Patient will perform lower body dressing with assist, with/without cues in positioning using equipment  (OT) Flowsheets (Taken 06/26/2023 1042) LTG: Pt will perform lower body dressing with assistance level of: Independent with assistive device   Problem: RH Toileting Goal: LTG Patient will perform toileting task (3/3 steps) with assistance level (OT) Description: LTG: Patient will perform toileting task (3/3 steps) with assistance level (OT)  Flowsheets (Taken 06/26/2023 1042) LTG: Pt will perform toileting task (3/3 steps) with assistance level: Independent with assistive device   Problem: RH Toilet Transfers Goal: LTG Patient will perform toilet transfers w/assist (OT) Description: LTG: Patient will perform toilet transfers with assist, with/without cues using equipment (OT) Flowsheets (Taken 06/26/2023 1042) LTG: Pt will perform toilet transfers with assistance level of: Independent with assistive device   Problem: RH Tub/Shower Transfers Goal: LTG Patient will perform tub/shower transfers w/assist (OT) Description: LTG: Patient will perform tub/shower transfers with assist, with/without cues using equipment (OT) Flowsheets (Taken 06/26/2023 1042) LTG: Pt will perform tub/shower stall transfers with assistance level of: Supervision/Verbal cueing

## 2023-06-26 NOTE — Progress Notes (Signed)
 Occupational Therapy Session Note  Patient Details  Name: Logan French MRN: [id number] Date of Birth: 08-16-1934  Today's Date: 06/26/2023 OT Individual Time: 1500-1530 OT Individual Time Calculation (min): 30 min  and Today's Date: 06/26/2023 OT Missed Time: 15 Minutes Missed Time Reason: Other (comment) (labs)   Short Term Goals: Week 1:  OT Short Term Goal 1 (Week 1): Pt will complete sit > stand in prep for ADL with CGA using LRAD OT Short Term Goal 2 (Week 1): Pt will engage in ADL task at sink in stance for 3 mins with min A to promote standing tolerance OT Short Term Goal 3 (Week 1): Pt will utilize AE PRN to thread LB clothing with supervision  Skilled Therapeutic Interventions/Progress Updates:  Skilled OT intervention completed with focus on ADL retraining. Pt received in recliner, agreeable to session. Bilateral knee pain reported; pre-medicated. OT offered rest breaks and repositioning throughout for pain reduction.  Pt had been reclining in recliner and anticipate pt slid self down as buttocks were closer to leg rest of recliner vs seat. With increased time, pt was able to scoot self back using BUE on arm rests with supervision for safe lowering of leg rest. Labs present for blood draw with difficulty obtaining sample there Pt missed 15 mins of OT intervention; OT will make up missed time as able.  OT prompted pt for toileting trial. Completed heavy mod A sit > stand with RW with cues to put one hand on recliner to power up with initial posterior bias in standing requiring mod A for retropulsion. Initial min A fading to CGA using RW for ambulation > BSC over toilet. Max A to lower clothing. Pt was incontinent of loose BM, but further continent or urinary void and BM. Stood with min A using BUE on arm rest, then OT attempted having pt complete pericare however fatigue expressed, requiring max A for toileting steps. Ambulated with CGA using RW with cues for AD positioning and  safe backwards stepping to EOB.   Supervision for sit > supine with increased time and effort. Donned RLE PRAFO for positioning per order. Pt remained upright in bed, with bed alarm on/activated, and with all needs in reach at end of session.   Therapy Documentation Precautions:  Precautions Precautions: Fall Precaution/Restrictions Comments: chronic bilateral knee pain Other Brace: PRAFO RLE when in bed Restrictions Weight Bearing Restrictions Per Provider Order: No    Therapy/Group: Individual Therapy  Ruthanna Covert, MS, OTR/L  06/26/2023, 3:36 PM

## 2023-06-26 NOTE — Progress Notes (Signed)
 Inpatient Rehabilitation Admission Medication Review by a Pharmacist  A complete drug regimen review was completed for this patient to identify any potential clinically significant medication issues.  High Risk Drug Classes Is patient taking? Indication by Medication  Antipsychotic No   Anticoagulant Yes Study- Librexia- AF- PAF  Antibiotic Yes Amoxicillin- cellulitis w/ strep bacteremia Mupirocin- MRSA eradication  Opioid Yes OxyIR, Tramadol- acute pain  Antiplatelet Yes Plavix- cva ppx  Hypoglycemics/insulin Yes Insulin- T2DM  Vasoactive Medication Yes Toprol- HTN  Chemotherapy No   Other Yes Colchicine- pericarditis Zetia, crestor- HLD Proscar- BPH Protonix- GERD     Type of Medication Issue Identified Description of Issue Recommendation(s)  Drug Interaction(s) (clinically significant)     Duplicate Therapy     Allergy     No Medication Administration End Date     Incorrect Dose     Additional Drug Therapy Needed     Significant med changes from prior encounter (inform family/care partners about these prior to discharge).    Other  PTA meds: Norvasc HCTZ Restart PTA meds when and if necessary during CIR admission or at time of discharge, if warranted    Clinically significant medication issues were identified that warrant physician communication and completion of prescribed/recommended actions by midnight of the next day:  No   Time spent performing this drug regimen review (minutes):  30   Eduar Kumpf A. Brynn Caras, PharmD, BCPS, FNKF Clinical Pharmacist [facility] Please utilize Amion for appropriate phone number to reach the unit pharmacist [facility])

## 2023-06-26 NOTE — Evaluation (Signed)
 Physical Therapy Assessment and Plan  Patient Details  Name: Logan French MRN: [id number] Date of Birth: 1934-06-29  PT Diagnosis: Difficulty walking, Edema, Muscle weakness, Osteoarthritis, and Pain in joint Rehab Potential: Good ELOS: 10-12 days   Today's Date: 06/26/2023 PT Individual Time: 1045-1200 PT Individual Time Calculation (min): 75 min    Hospital Problem: Principal Problem:   Bacteremia   Past Medical History:  Past Medical History:  Diagnosis Date   A-fib (HCC)    B12 deficiency    Diverticulosis    Fatigue    History of colonic polyps    Hyperlipemia    Hypertension    Pernicious anemia    Prostate cancer (HCC)    Syncope    Syncope    Past Surgical History:  Past Surgical History:  Procedure Laterality Date   ABDOMINAL AORTOGRAM W/LOWER EXTREMITY N/A 05/17/2023   Procedure: ABDOMINAL AORTOGRAM W/LOWER EXTREMITY;  Surgeon: Logan Brawn, MD;  Location: [facility] INVASIVE CV LAB;  Service: Cardiovascular;  Laterality: N/A;   CARDIOVERSION N/A 10/07/2021   Procedure: CARDIOVERSION;  Surgeon: Logan Morin, DO;  Location: [facility] ENDOSCOPY;  Service: Cardiovascular;  Laterality: N/A;   IR ANGIO INTRA EXTRACRAN SEL COM CAROTID INNOMINATE BILAT MOD SED  09/10/2017   IR ANGIO VERTEBRAL SEL SUBCLAVIAN INNOMINATE BILAT MOD SED  09/10/2017   PERIPHERAL INTRAVASCULAR LITHOTRIPSY Right 05/17/2023   Procedure: PERIPHERAL INTRAVASCULAR LITHOTRIPSY;  Surgeon: Logan Brawn, MD;  Location: [facility] INVASIVE CV LAB;  Service: Cardiovascular;  Laterality: Right;  SFA-POP   PERIPHERAL VASCULAR BALLOON ANGIOPLASTY Right 05/17/2023   Procedure: PERIPHERAL VASCULAR BALLOON ANGIOPLASTY;  Surgeon: Logan Brawn, MD;  Location: [facility] INVASIVE CV LAB;  Service: Cardiovascular;  Laterality: Right;  SFA-POP    Assessment & Plan Clinical Impression: Logan French. Logan French. is a 89 year old right-handed male with history of atrial fibrillation with cardioversion 10/07/2021 per Logan French,Logan French,  hyperlipidemia, syncope, hypertension, pernicious anemia, prostate cancer, peripheral vascular disease status post angioplasty of the right popliteal artery 05/17/2023 per Logan French. Per chart review patient lives with spouse. Two-level home with bed and bath downstairs. Independent driving using a cane at times. Presented for 06/03/2023 for right leg cellulitis and wound . Patient has stated since recent vascular procedure that wound has not healed the leg remains swollen with increasing pain as per her latest visit with general surgery 06/15/2023. CT of the right lower extremity showed extensive subcutaneous edema of the right lower extremity asymmetrically more severe along the anterior lateral aspect. No loculated subcutaneous fluid collection. The edematous changes appear to be superficial to the investing muscular fascia. Latest venous Doppler study showed no signs of DVT. Admission chemistries unremarkable except sodium 134 glucose 134 BUN 26 creatinine 1.42, total bilirubin 1.3, lactic acid 2.8-2.4, WBC 18,900, hemoglobin A1c 6.4 blood cultures group A strep (S. Pyogenes). Infectious disease consulted and initially placed on penicillin and Zyvox and since changed to high-dose p.o. amoxicillin 1 g p.o. 3 times daily through 07/04/2023.Logan French Latest blood culture showed no growth. Echocardiogram shows no obvious vegetation. Vascular surgery as well as Logan French continues to follow wound and plan to be enrolled in venous leg ulcer study.. Wound care dressings as advised no other further plan for surgical intervention. Hospital course hyponatremia/SIADH and currently maintained on fluid restriction with latest sodium 129. History of atrial fibrillation controlled rate maintained on Toprol and currently on Librexa AF trial (comparing Eliquis versus milvexian-factor XI inhibitor). New diagnosis of diabetes mellitus type 2 hemoglobin A1c 6.4 SSI for now and  plan metformin on discharge. Patient with persistent left knee pain  as noted Dopplers negative x-ray showed chronic arthritis placed on trial of colchicine and continue supportive care. Uric acid levels were normal. Therapy evaluations completed due to patient decreased functional mobility was admitted for a comprehensive rehab program. Patient transferred to CIR on 06/25/2023 .   Patient currently requires mod with mobility secondary to muscle weakness and muscle joint tightness, decreased cardiorespiratoy endurance, and decreased standing balance and decreased balance strategies.  Prior to hospitalization, patient was independent  with mobility and lived with Spouse in a House home.  Home access is 3 STE front entry with R ledge vs rail, planning to install railStairs to enter.  Patient will benefit from skilled PT intervention to maximize safe functional mobility, minimize fall risk, and decrease caregiver burden for planned discharge home with 24 hour assist.  Anticipate patient will benefit from follow up [facility] at discharge.  PT - End of Session Activity Tolerance: Tolerates 30+ min activity with multiple rests Endurance Deficit: Yes Endurance Deficit Description: mild SOB and expressed fatigue with functional mobility PT Assessment Rehab Potential (ACUTE/IP ONLY): Good PT Barriers to Discharge: Home environment access/layout;Decreased caregiver support;Wound Care PT Patient demonstrates impairments in the following area(s): Balance;Nutrition;Skin Integrity;Behavior;Pain;Edema;Endurance;Safety;Motor PT Transfers Functional Problem(s): Bed Mobility;Bed to Chair;Car PT Locomotion Functional Problem(s): Ambulation;Wheelchair Mobility;Stairs PT Plan PT Intensity: Minimum of 1-2 x/day ,45 to 90 minutes PT Frequency: 5 out of 7 days PT Duration Estimated Length of Stay: 10-12 days PT Treatment/Interventions: Ambulation/gait training;Cognitive remediation/compensation;Discharge planning;DME/adaptive equipment instruction;Functional mobility training;Pain  management;Psychosocial support;Therapeutic Activities;Splinting/orthotics;UE/LE Strength taining/ROM;Visual/perceptual remediation/compensation;Wheelchair propulsion/positioning;UE/LE Coordination activities;Therapeutic Exercise;Stair training;Skin care/wound management;Patient/family education;Neuromuscular re-education;Functional electrical stimulation;Disease management/prevention;Community reintegration;Balance/vestibular training PT Transfers Anticipated Outcome(s): supervision transfers PT Locomotion Anticipated Outcome(s): supervision short distance gait PT Recommendation Recommendations for Other Services: None Follow Up Recommendations: Home health PT Patient destination: Home Equipment Recommended: To be determined   PT Evaluation Precautions/Restrictions Precautions Precautions: Fall Precaution/Restrictions Comments: chronic bilateral knee pain Required Braces or Orthoses: Other Brace Other Brace: PRAFO RLE when in bed Restrictions Weight Bearing Restrictions Per Provider Order: No General   Vital Signs  Pain Pain Assessment Pain Scale: 0-10 Pain Score: 0-No pain Pain Location: Leg Pain Intervention(s): Medication (See eMAR) Pain Interference Pain Interference Pain Effect on Sleep: 1. Rarely or not at all Pain Interference with Therapy Activities: 1. Rarely or not at all Pain Interference with Day-to-Day Activities: 1. Rarely or not at all Home Living/Prior Functioning Home Living Available Help at Discharge: Family;Available 24 hours/day (wife available 24/7 and can provide physical assist as needed, family could be arranged as well if needed) Type of Home: House Home Access: Stairs to enter [employer] of Steps: 3 STE front entry with R ledge vs rail, planning to install rail Home Layout: Multi-level;Able to live on main level with bedroom/bathroom Alternate Level Stairs-Number of Steps: 2 steps down into kitchen/living room with railing. main level has  bedroom/bathroom and an upstairs that pt does not have to use Additional Comments: has SPC Prior Function Level of Independence: Independent with basic ADLs;Independent with gait;Independent with transfers;Independent with homemaking with ambulation  Able to Take Stairs?: Yes Driving: Yes Vocation: Part time employment Vocation Requirements: part time lawyer Vision/Perception  Vision - History Ability to See in Adequate Light: 1 Impaired Perception Perception: Within Functional Limits Praxis Praxis: WFL  Cognition Overall Cognitive Status: Within Functional Limits for tasks assessed Arousal/Alertness: Awake/alert Orientation Level: Oriented X4 Memory: Appears intact Awareness: Appears intact Problem Solving: Appears intact Safety/Judgment: Appears intact  Sensation Sensation Light Touch: Appears Intact Hot/Cold: Not tested Proprioception: Appears Intact Stereognosis: Not tested Coordination Gross Motor Movements are Fluid and Coordinated: No Coordination and Movement Description: grossly dycoordinated d/t weakness Motor  Motor Motor: Within Functional Limits   Trunk/Postural Assessment  Cervical Assessment Cervical Assessment: Within Functional Limits Thoracic Assessment Thoracic Assessment: Within Functional Limits Lumbar Assessment Lumbar Assessment: Within Functional Limits Postural Control Postural Control: Deficits on evaluation Righting Reactions: delayed Protective Responses: inadequate  Balance Balance Balance Assessed: Yes Static Sitting Balance Static Sitting - Balance Support: Feet supported;No upper extremity supported Static Sitting - Level of Assistance: 5: Stand by assistance Dynamic Sitting Balance Dynamic Sitting - Balance Support: Feet supported;No upper extremity supported Dynamic Sitting - Level of Assistance: 5: Stand by assistance Static Standing Balance Static Standing - Balance Support: Bilateral upper extremity supported;During  functional activity Static Standing - Level of Assistance: 5: Stand by assistance Dynamic Standing Balance Dynamic Standing - Balance Support: During functional activity;Bilateral upper extremity supported Dynamic Standing - Level of Assistance: 4: Min assist Extremity Assessment      RLE Assessment RLE Assessment: Exceptions to [facility] General Strength Comments: grossly 4/5 LLE Assessment LLE Assessment: Exceptions to [facility] General Strength Comments: grossly 4/5  Care Tool Care Tool Bed Mobility Roll left and right activity   Roll left and right assist level: Moderate Assistance - Patient 50 - 74%    Sit to lying activity   Sit to lying assist level: Moderate Assistance - Patient 50 - 74%    Lying to sitting on side of bed activity   Lying to sitting on side of bed assist level: the ability to move from lying on the back to sitting on the side of the bed with no back support.: Moderate Assistance - Patient 50 - 74%     Care Tool Transfers Sit to stand transfer   Sit to stand assist level: Moderate Assistance - Patient 50 - 74%    Chair/bed transfer   Chair/bed transfer assist level: Minimal Assistance - Patient > 75%    Car transfer   Car transfer assist level: Moderate Assistance - Patient 50 - 74%      Care Tool Locomotion Ambulation   Assist level: 2 helpers (min +W/C follow) Assistive device: Walker-rolling Max distance: 20 ft  Walk 10 feet activity   Assist level: 2 helpers Assistive device: Walker-rolling   Walk 50 feet with 2 turns activity Walk 50 feet with 2 turns activity did not occur: Safety/medical concerns      Walk 150 feet activity Walk 150 feet activity did not occur: Safety/medical concerns      Walk 10 feet on uneven surfaces activity Walk 10 feet on uneven surfaces activity did not occur: Safety/medical concerns      Stairs Stair activity did not occur: Safety/medical concerns        Walk up/down 1 step activity Walk up/down 1 step or curb  (drop down) activity did not occur: Safety/medical concerns      Walk up/down 4 steps activity Walk up/down 4 steps activity did not occur: Safety/medical concerns      Walk up/down 12 steps activity Walk up/down 12 steps activity did not occur: Safety/medical concerns      Pick up small objects from floor Pick up small object from the floor (from standing position) activity did not occur: Safety/medical concerns      Wheelchair Is the patient using a wheelchair?: Yes Type of Wheelchair: Manual   Wheelchair assist level: Supervision/Verbal cueing  Max wheelchair distance: 150 ft  Wheel 50 feet with 2 turns activity   Assist Level: Supervision/Verbal cueing  Wheel 150 feet activity   Assist Level: Supervision/Verbal cueing    Refer to Care Plan for Long Term Goals  SHORT TERM GOAL WEEK 1 PT Short Term Goal 1 (Week 1): pt will perform STS with min a consistently PT Short Term Goal 2 (Week 1): Pt will ambulate x 30 ft with assist PT Short Term Goal 3 (Week 1): Pt will initiate stair training  Recommendations for other services: None   Skilled Therapeutic Intervention Evaluation completed (see details above) with patient education regarding purpose of PT evaluation, PT POC and goals, therapy schedule, weekly team meetings, and other CIR information including safety plan and fall risk safety. Pt without c/o of increased pain during eval.  Pt performed the below functional mobility tasks with the specified levels of skilled cuing and assistance.  Pt remained seated in recliner at end of session, was left with all needs in reach and alarm active.   Mobility Bed Mobility Bed Mobility: Left Sidelying to Sit Left Sidelying to Sit: Moderate Assistance - Patient 50-74% Transfers Transfers: Sit to Stand;Stand Pivot Transfers;Stand to Sit Sit to Stand: Moderate Assistance - Patient 50-74% Stand to Sit: Minimal Assistance - Patient > 75% Stand Pivot Transfers: Minimal Assistance - Patient  > 75% Stand Pivot Transfer Details: Verbal cues for safe use of DME/AE;Verbal cues for technique Transfer (Assistive device): Rolling walker Locomotion  Gait Ambulation: Yes Gait Assistance: Minimal Assistance - Patient > 75%;2 Helpers (w/c follow) Gait Distance (Feet): 20 Feet Assistive device: Rolling walker Gait Assistance Details: Verbal cues for safe use of DME/AE;Verbal cues for precautions/safety Gait Gait: Yes Gait Pattern: Impaired Gait Pattern: Decreased stride length;Decreased hip/knee flexion - right   Discharge Criteria: Patient will be discharged from PT if patient refuses treatment 3 consecutive times without medical reason, if treatment goals not met, if there is a change in medical status, if patient makes no progress towards goals or if patient is discharged from hospital.  The above assessment, treatment plan, treatment alternatives and goals were discussed and mutually agreed upon: by patient  Tex Filbert 06/26/2023, 1:04 PM

## 2023-06-27 DIAGNOSIS — R7881 Bacteremia: Secondary | ICD-10-CM | POA: Diagnosis not present

## 2023-06-27 LAB — GLUCOSE, CAPILLARY
Glucose-Capillary: 98 mg/dL (ref 70–99)
Glucose-Capillary: 142 mg/dL — ABNORMAL HIGH (ref 70–99)

## 2023-06-27 MED ORDER — SENNOSIDES-DOCUSATE SODIUM 8.6-50 MG PO TABS
1.0000 | ORAL_TABLET | Freq: Every day | ORAL | Status: DC
  Administered 2023-06-28: 1 via ORAL
  Filled 2023-06-27: qty 1

## 2023-06-27 MED ORDER — L-METHYLFOLATE-B6-B12 3-35-2 MG PO TABS
1.0000 | ORAL_TABLET | Freq: Every day | ORAL | Status: DC
  Administered 2023-06-27 – 2023-07-07 (×11): 1 via ORAL
  Filled 2023-06-27 (×11): qty 1

## 2023-06-27 MED ORDER — VITAMIN D 25 MCG (1000 UNIT) PO TABS
1000.0000 [IU] | ORAL_TABLET | Freq: Every day | ORAL | Status: DC
  Administered 2023-06-27 – 2023-07-07 (×11): 1000 [IU] via ORAL
  Filled 2023-06-27 (×11): qty 1

## 2023-06-27 NOTE — Progress Notes (Signed)
 Physical Therapy Session Note  Patient Details  Name: Logan French MRN: [id number] Date of Birth: 12-20-1934  Today's Date: 06/27/2023 PT Individual Time: 4132-4401 PT Individual Time Calculation (min): 77 min   Short Term Goals: Week 1:  PT Short Term Goal 1 (Week 1): pt will perform STS with min a consistently PT Short Term Goal 2 (Week 1): Pt will ambulate x 30 ft with assist PT Short Term Goal 3 (Week 1): Pt will initiate stair training  Skilled Therapeutic Interventions/Progress Updates:    Pt presents in bed finishing with urinal. Assist with changing of brief (the stickers had been ripped, so needed a new one, not due to being soiled). Rolling with min assist with cues for technique to doff old and don new brief to the R and L. Mod assist to come to EOB for trunk support with cues for technique and extra time. EOB sat with supervision for dyanmic sitting balance. Assisted with donning of pants (unable to reach independently) and pt pulled up as far as possible sitting. THen focused on sit > stand stands with RW with slightly elevated surface with mod assist and cues for hand placement and facilitation of anterior weighshift and min assist for balance for transition - PT pulled up pants. In standing for about 1-2 min, pt performed weightshifting and knee marches to stretch out BLE. Transferred over to w/c with stand step transfer slowly but with overall CGA/min assist. Self propelled w/c for UE strengthening and endurance as well as functional mobility down to therapy gym x 120' with supervision. Practiced simualted car transfer to low SUV per pt request - requiring min to mod assist for transfer and assist with BLE in and out of car - discussed difference in this car vs real car and leg room. Pt able to gait x 10' x 2 to and from car with RW with CGA and slow cadence. Then focused on functional gait training with RW in hallway x 120' back to patient room with overall CGA, cues for upright  posture. Set up in w/c with all needs in reach.   Therapy Documentation Precautions:  Precautions Precautions: Fall Precaution/Restrictions Comments: chronic bilateral knee pain Required Braces or Orthoses: Other Brace Other Brace: PRAFO RLE when in bed Restrictions Weight Bearing Restrictions Per Provider Order: No    Pain: Pain Assessment Pain Scale: 0-10 Pain Score: 0-No pain    Therapy/Group: Individual Therapy  Gita Lamb Amadeo June, PT, DPT, CBIS  06/27/2023, 11:49 AM

## 2023-06-27 NOTE — Progress Notes (Signed)
 Occupational Therapy Session Note  Patient Details  Name: Logan French MRN: [id number] Date of Birth: [birth year]  Today's Date: 06/27/2023 OT Individual Time: 0230-0330 OT Individual Time Calculation (min): 60 min    Short Term Goals: Week 1:  OT Short Term Goal 1 (Week 1): Pt will complete sit > stand in prep for ADL with CGA using LRAD OT Short Term Goal 2 (Week 1): Pt will engage in ADL task at sink in stance for 3 mins with min A to promote standing tolerance OT Short Term Goal 3 (Week 1): Pt will utilize AE PRN to thread LB clothing with supervision  Skilled Therapeutic Interventions/Progress Updates:    Patient seen this day for skilled OT, patient seated at w/c LOF with his knee selves  in place. Patient indicated that he was experience some pain secondary to the knee sleves and edema being present with BLE..  Nursing was made aware of the pt pain response of 8 on 0-10 scale and the  remove sleves were removed.  The pt was able to complete 3 sit to stand  requiring CGA  with 1 rest break. The pt went on to complete UB exercise using a 2lb dowel for shld flexion, horizontal abduction, shld rotatioin, and lg circle 2 set of 10 with rest breaks as needed. The pt required 1 rest break with each exercise.  The pt went on to complete the  UB cycle for 10 minutes in duration with 1 rest break. The pt was then transported back to bed LOF requiring CGA for coming from sit to stand using the RW for additional balance. The pt was able to side step using the RW  for good placement at the top of the bed with CGA. The pt was able to go from standing to sitting EOB with close S and he was able to transferring from sitting EOB to placing his feet onto the bed and resting in supine with close S. Nursing came in at the close of the OT session for imaging of the pt's legs.  Nursing was able to address all additional needs of the pt prior to exiting the room.  Therapy Documentation Precautions:   Precautions Precautions: Fall Precaution/Restrictions Comments: chronic bilateral knee pain Required Braces or Orthoses: Other Brace Other Brace: PRAFO RLE when in bed Restrictions Weight Bearing Restrictions Per Provider Order: No  Therapy/Group: Individual Therapy  Logan French 06/27/2023, 3:40 PM

## 2023-06-27 NOTE — Discharge Instructions (Addendum)
 Inpatient Rehab Discharge Instructions  Logan French Discharge date and time: No discharge date for patient encounter.   Activities/Precautions/ Functional Status: Activity: As tolerated Diet: Diabetic diet Wound Care: Routine skin checks Functional status:  ___ No restrictions     ___ Walk up steps independently ___ 24/7 supervision/assistance   ___ Walk up steps with assistance ___ Intermittent supervision/assistance  ___ Bathe/dress independently ___ Walk with walker     _x__ Bathe/dress with assistance ___ Walk Independently    ___ Shower independently ___ Walk with assistance    ___ Shower with assistance ___ No alcohol      ___ Return to work/school ________  Special Instructions: No driving smoking or alcohol    My questions have been answered and I understand these instructions. I will adhere to these goals and the provided educational materials after my discharge from the hospital.  Patient/Caregiver Signature _______________________________ Date __________  Clinician Signature _______________________________________ Date __________  Please bring this form and your medication list with you to all your follow-up doctor's appointments. Inpatient Rehab Discharge Instructions  Logan French Discharge date and time: No discharge date for patient encounter.   Activities/Precautions/ Functional Status: Activity: As tolerated Diet: Diabetic diet Wound Care: Routine skin checks Functional status:  ___ No restrictions     ___ Walk up steps independently ___ 24/7 supervision/assistance   ___ Walk up steps with assistance ___ Intermittent supervision/assistance  ___ Bathe/dress independently ___ Walk with walker     _x__ Bathe/dress with assistance ___ Walk Independently    ___ Shower independently ___ Walk with assistance    ___ Shower with assistance ___ No alcohol      ___ Return to work/school ________  Special Instructions: No driving smoking or  alcohol   Apply 4 x 4 gauze dampened with Vashe to the right Achilles ulcer.  Applied Ace wrap from the metatarsal head up to the tibial tubercale and change daily   COMMUNITY REFERRALS UPON DISCHARGE:    Outpatient: PT      OT              Agency:[facility]       Phone:[phone]              Appointment Date/Time:*Please expect follow-up within 7-10 business days to schedule your appointment. If you have not received follow-up, be sure to contact the site directly.*   Medical Equipment/Items Ordered:rollator                                                 Agency/Supplier: [facility] [phone]    My questions have been answered and I understand these instructions. I will adhere to these goals and the provided educational materials after my discharge from the hospital.  Patient/Caregiver Signature _______________________________ Date __________  Clinician Signature _______________________________________ Date __________  Please bring this form and your medication list with you to all your follow-up doctor's appointments.

## 2023-06-27 NOTE — Progress Notes (Signed)
 Occupational Therapy Session Note  Patient Details  Name: Logan French MRN: [id number] Date of Birth: 1935-01-14  Session 1: {CHL IP REHAB OT TIME CALCULATIONS:304400400} Session 2: {CHL IP REHAB OT TIME CALCULATIONS:304400400}  Short Term Goals: Week 1:  OT Short Term Goal 1 (Week 1): Pt will complete sit > stand in prep for ADL with CGA using LRAD OT Short Term Goal 2 (Week 1): Pt will engage in ADL task at sink in stance for 3 mins with min A to promote standing tolerance OT Short Term Goal 3 (Week 1): Pt will utilize AE PRN to thread LB clothing with supervision  Skilled Therapeutic Interventions/Progress Updates:    Session 1: Patient agreeable to participate in OT session. Reports *** pain level.   Patient participated in skilled OT session focusing on ***. Therapist facilitated/assessed/developed/educated/integrated/elicited *** in order to improve/facilitate/promote   Session 2: Patient agreeable to participate in OT session. Reports *** pain level.   Patient participated in skilled OT session focusing on ***. Therapist facilitated/assessed/developed/educated/integrated/elicited *** in order to improve/facilitate/promote    Therapy Documentation Precautions:  Precautions Precautions: Fall Precaution/Restrictions Comments: chronic bilateral knee pain Required Braces or Orthoses: Other Brace Other Brace: PRAFO RLE when in bed Restrictions Weight Bearing Restrictions Per Provider Order: No   Therapy/Group: Individual Therapy  Carollee Circle, OTR/L,CBIS  Supplemental OT - [facility] and [facility] Secure Chat Preferred   06/27/2023, 9:23 PM

## 2023-06-27 NOTE — Discharge Summary (Signed)
 Physician Discharge Summary  Patient ID: Logan French MRN: [id number] DOB/AGE: 05-28-1934 89 y.o.  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 EXAM: PORTABLE CHEST 1 VIEW COMPARISON:  June 17, 2023. FINDINGS: Stable cardiomegaly. Hypoinflation of the lungs with minimal bibasilar subsegmental atelectasis. Bony thorax is unremarkable. IMPRESSION: Hypoinflation of the lungs with minimal bibasilar subsegmental atelectasis. Electronically Signed   By: Rosalene Colon M.D.   On: 06/22/2023 09:56   VAS US  LOWER EXTREMITY ARTERIAL DUPLEX Result Date: 06/20/2023 LOWER EXTREMITY ARTERIAL DUPLEX STUDY Patient Name:  Logan French  Date of Exam:   06/19/2023 Medical Rec #: [id number]           Accession #:    [id number] Date of Birth: 1934/07/18           Patient Gender: M Patient Age:   89 years Exam Location:  [facility] Procedure:      VAS US  LOWER EXTREMITY ARTERIAL DUPLEX Referring Phys: Cordie Deters --------------------------------------------------------------------------------  Indications: Ulceration. Cellulitis. High Risk Factors: Hypertension, hyperlipidemia. Other Factors: Atrial fibrillation.  Vascular Interventions: Abdominal aortogram with LE peripheral                         intravascular lithotripsy and balloon angioplasty of the                         SFA-Pop                          05/17/23. Current ABI:            R:patent DP and AT TBI:0.62 L:0.98 TBI 0.35 Limitations:  Edema and pain with touch secondary to infection Comparison Study: Prior right LE arterial duplex done 04/26/23 Performing Technologist: Carleene Chase RVS  Examination Guidelines: A complete evaluation includes B-mode imaging, spectral Doppler, color Doppler, and power Doppler as needed of all accessible portions of each vessel. Bilateral testing is considered an integral part of a complete examination. Limited examinations for reoccurring indications may be performed as noted.  +----------+--------+-----+---------------+-----------+--------+ RIGHT     PSV cm/sRatioStenosis       Waveform   Comments +----------+--------+-----+---------------+-----------+--------+ CFA Distal65                          multiphasic         +----------+--------+-----+---------------+-----------+--------+ DFA       58                          multiphasic         +----------+--------+-----+---------------+-----------+--------+ SFA Prox  124                         biphasic            +----------+--------+-----+---------------+-----------+--------+ SFA Mid   264          30-49% stenosisbiphasic            +----------+--------+-----+---------------+-----------+--------+  SFA Distal235          30-49% stenosisbiphasic            +----------+--------+-----+---------------+-----------+--------+ POP Prox  80                          monophasic          +----------+--------+-----+---------------+-----------+--------+ POP Mid   66                          monophasic          +----------+--------+-----+---------------+-----------+--------+ POP Distal60                          monophasic          +----------+--------+-----+---------------+-----------+--------+ TP Trunk  116                         monophasic          +----------+--------+-----+---------------+-----------+--------+ ATA Prox  129                         monophasic           +----------+--------+-----+---------------+-----------+--------+ ATA Mid   133                         monophasic          +----------+--------+-----+---------------+-----------+--------+ ATA Distal105                         monophasic          +----------+--------+-----+---------------+-----------+--------+ PTA Prox               occluded                           +----------+--------+-----+---------------+-----------+--------+ PTA Mid                occluded                           +----------+--------+-----+---------------+-----------+--------+ PTA Distal             occluded                           +----------+--------+-----+---------------+-----------+--------+  Summary: Right: 30-49% stenosis noted in the mid and distal superficial femoral artery. PTA remains occluded.  See table(s) above for measurements and observations. Electronically signed by Genny Kid MD on 06/20/2023 at 10:45:17 AM.    Final    DG Tibia/Fibula Right Result Date: 06/20/2023 CLINICAL DATA:  Lower extremity cellulitis. EXAM: RIGHT TIBIA AND FIBULA - 2 VIEW COMPARISON:  None Available. FINDINGS: No evidence for an acute fracture. No bony destruction in the tibia or fibula to suggest osteomyelitis. Mass degenerative changes are seen at the knee. No soft tissue gas evident in the leg. IMPRESSION: 1. No findings to suggest osteomyelitis in the tibia or fibula 2. Degenerative changes at the knee. Electronically Signed   By: Donnal Fusi M.D.   On: 06/20/2023 08:03   DG Knee Complete 4 Views Left Result Date: 06/20/2023 CLINICAL DATA:  Recurrent left knee pain. EXAM: LEFT KNEE - COMPLETE 4+ VIEW COMPARISON:  None Available. FINDINGS: No evidence of fracture, dislocation, or  joint effusion. Minimal spurring is seen involving medial and lateral joint spaces. Chondrocalcinosis is also noted most consistent with early degenerative joint disease. Vascular calcifications are noted. IMPRESSION: Probable  mild degenerative joint disease. No acute abnormality seen. Electronically Signed   By: Rosalene Colon M.D.   On: 06/20/2023 08:03   VAS US  ABI WITH/WO TBI Result Date: 06/19/2023  LOWER EXTREMITY DOPPLER STUDY Patient Name:  Logan French  Date of Exam:   06/19/2023 Medical Rec #: [id number]           Accession #:    [id number] Date of Birth: [birth year]           Patient Gender: M Patient Age:   68 years Exam Location:  [facility] Procedure:      VAS US  ABI WITH/WO TBI Referring Phys: Cordie Deters --------------------------------------------------------------------------------  Indications: Ulceration. Cellulitis High Risk Factors: Hypertension, hyperlipidemia. Other Factors: Atrial fibrillation.  Vascular Interventions: Abdominal aortogram with LE peripheral intravascular                         lithotripsy and balloon angioplasty of the SFA-Pop                         05/17/23. Limitations: Today's exam was limited due to an open wound. Comparison Study: Prior ABI done 04/26/23 Performing Technologist: Carleene Chase RVS  Examination Guidelines: A complete evaluation includes at minimum, Doppler waveform signals and systolic blood pressure reading at the level of bilateral brachial, anterior tibial, and posterior tibial arteries, when vessel segments are accessible. Bilateral testing is considered an integral part of a complete examination. Photoelectric Plethysmograph (PPG) waveforms and toe systolic pressure readings are included as required and additional duplex testing as needed. Limited examinations for reoccurring indications may be performed as noted.  ABI Findings: +---------+------------------+-----+---------+--------+ Right    Rt Pressure (mmHg)IndexWaveform Comment  +---------+------------------+-----+---------+--------+ Brachial 110                    triphasic         +---------+------------------+-----+---------+--------+ PTA                             absent             +---------+------------------+-----+---------+--------+ DP                              biphasic          +---------+------------------+-----+---------+--------+ Great Toe72                0.62                   +---------+------------------+-----+---------+--------+ +---------+------------------+-----+---------+-------+ Left     Lt Pressure (mmHg)IndexWaveform Comment +---------+------------------+-----+---------+-------+ Brachial 117                    triphasic        +---------+------------------+-----+---------+-------+ PTA      115               0.98 biphasic         +---------+------------------+-----+---------+-------+ DP       113               0.97 biphasic         +---------+------------------+-----+---------+-------+ Alene Husk  0.35                  +---------+------------------+-----+---------+-------+ +-------+----------------+-----------+------------------+------------+ ABI/TBIToday's ABI     Today's TBIPrevious ABI      Previous TBI +-------+----------------+-----------+------------------+------------+ Right  AT and DP patent0.62       0.73 (occluded PT)0.45         +-------+----------------+-----------+------------------+------------+ Left   0.98            0.35       0.85              0.70         +-------+----------------+-----------+------------------+------------+ Bilateral TBIs appear decreased compared to prior study on 04/26/23. Left ABIs appear increased compared to prior study on 04/26/23.  Summary: Right: The right toe-brachial index is abnormal. Left: Resting left ankle-brachial index is within normal range. The left toe-brachial index is abnormal. *See table(s) above for measurements and observations.  Electronically signed by Genny Kid MD on 06/19/2023 at 2:11:36 PM.    Final    VAS US  LOWER EXTREMITY VENOUS (DVT) Result Date: 06/19/2023  Lower Venous DVT Study Patient Name:  Logan French  Date of  Exam:   06/19/2023 Medical Rec #: [id number]           Accession #:    [id number] Date of Birth: 1934-08-27           Patient Gender: M Patient Age:   20 years Exam Location:  [facility] Procedure:      VAS US  LOWER EXTREMITY VENOUS (DVT) Referring Phys: Terre Ferri --------------------------------------------------------------------------------  Indications: Pain, Swelling, Erythema, and Cellulitis. Open wound on achilles heel.  Limitations: Edema, pain with compression maneuvers/touch. Comparison Study: No prior study on file Performing Technologist: Carleene Chase RVS  Examination Guidelines: A complete evaluation includes B-mode imaging, spectral Doppler, color Doppler, and power Doppler as needed of all accessible portions of each vessel. Bilateral testing is considered an integral part of a complete examination. Limited examinations for reoccurring indications may be performed as noted. The reflux portion of the exam is performed with the patient in reverse Trendelenburg.  +---------+---------------+---------+-----------+---------------+--------------+ RIGHT    CompressibilityPhasicitySpontaneityProperties     Thrombus Aging +---------+---------------+---------+-----------+---------------+--------------+ CFV      Full           Yes      No         pulsatile                                                                 waveforms                     +---------+---------------+---------+-----------+---------------+--------------+ SFJ      Full                                                             +---------+---------------+---------+-----------+---------------+--------------+ FV Prox                 Yes      No         pulsatile  waveforms                     +---------+---------------+---------+-----------+---------------+--------------+ FV Mid                  Yes      No          pulsatile                                                                 waveforms                     +---------+---------------+---------+-----------+---------------+--------------+ FV Distal               Yes      No         pulsatile                                                                 waveforms                     +---------+---------------+---------+-----------+---------------+--------------+ PFV                     Yes      No         pulsatile                                                                 waveforms                     +---------+---------------+---------+-----------+---------------+--------------+ POP                     Yes      No         pulsatile                                                                 waveforms                     +---------+---------------+---------+-----------+---------------+--------------+ PTV                                                        Not well  visualized     +---------+---------------+---------+-----------+---------------+--------------+ PERO                                                       Not well                                                                  visualized     +---------+---------------+---------+-----------+---------------+--------------+ Gastroc                 Yes      No                                       +---------+---------------+---------+-----------+---------------+--------------+   +----+---------------+---------+-----------+----------+--------------+ LEFTCompressibilityPhasicitySpontaneityPropertiesThrombus Aging +----+---------------+---------+-----------+----------+--------------+ CFV Full           Yes      Yes                                 +----+---------------+---------+-----------+----------+--------------+     Summary: RIGHT:  - There is no evidence of deep vein thrombosis in the lower extremity. However, portions of this examination were limited- see technologist comments above.  - No cystic structure found in the popliteal fossa. pulsatile waveforms noted  LEFT: - No evidence of common femoral vein obstruction.   *See table(s) above for measurements and observations. Electronically signed by Genny Kid MD on 06/19/2023 at 2:05:25 PM.    Final    DG Tibia/Fibula Right Port Result Date: 06/19/2023 CLINICAL DATA:  Leg pain. EXAM: PORTABLE RIGHT TIBIA AND FIBULA - 2 VIEW COMPARISON:  CT 06/17/2023 FINDINGS: No fracture. No erosive or bony destructive change. Knee osteoarthritis and chondrocalcinosis again seen. Again seen calcifications in the region of the Achilles tendon. There is generalized subcutaneous edema. Arterial vascular calcifications. No soft tissue gas. IMPRESSION: 1. Generalized subcutaneous edema. No acute osseous abnormality. 2. Knee osteoarthritis and chondrocalcinosis. Electronically Signed   By: Chadwick Colonel M.D.   On: 06/19/2023 10:05   ECHOCARDIOGRAM COMPLETE Result Date: 06/18/2023    ECHOCARDIOGRAM REPORT   Patient Name:   Logan French Date of Exam: 06/18/2023 Medical Rec #:  [id number]          Height:       72.0 in Accession #:    [id number]         Weight:       216.9 lb Date of Birth:  10/30/1934          BSA:          2.205 m Patient Age:    88 years           BP:           111/74 mmHg Patient Gender: M                  HR:           68 bpm. Exam Location:  Inpatient Procedure: 2D Echo, Cardiac Doppler and Color Doppler (Both  Spectral and Color            Flow Doppler were utilized during procedure). Indications:    Bacteremia  History:        Patient has prior history of Echocardiogram examinations, most                 recent 09/08/2021. Arrythmias:Atrial Fibrillation,                 Signs/Symptoms:Edema, Fatigue, Dizziness/Lightheadedness and                 Syncope; Risk Factors:Hypertension and  Dyslipidemia.  Sonographer:    Juanita Shaw Referring Phys: [id number] Tylene Galla M GHIMIRE IMPRESSIONS  1. Left ventricular ejection fraction, by estimation, is 50%. The left ventricle has mildly decreased function. The left ventricle demonstrates regional wall motion abnormalities with suspected basal to mid inferolateral and basal anterolateral hypokinesis. However, endocardial definition was poor. Definity contrast may have helped but was not used. The left ventricular internal cavity size was mildly dilated. Left ventricular diastolic parameters are indeterminate.  2. Right ventricular systolic function is mildly reduced. The right ventricular size is normal.  3. Left atrial size was mildly dilated.  4. The mitral valve is normal in structure. Mild mitral valve regurgitation. No evidence of mitral stenosis.  5. The aortic valve is tricuspid. There is moderate calcification of the aortic valve. Aortic valve regurgitation is trivial. Aortic valve sclerosis/calcification is present, without any evidence of aortic stenosis.  6. Aortic dilatation noted. There is mild dilatation of the aortic root, measuring 39 mm.  7. The IVC was not visualized.  8. No definite valvular vegetation was visualized, but technically difficult study, and if endocarditis is suspected will need TEE. FINDINGS  Left Ventricle: Left ventricular ejection fraction, by estimation, is 50%. The left ventricle has mildly decreased function. The left ventricle demonstrates regional wall motion abnormalities. The left ventricular internal cavity size was mildly dilated. There is no left ventricular hypertrophy. Left ventricular diastolic parameters are indeterminate. Right Ventricle: The right ventricular size is normal. No increase in right ventricular wall thickness. Right ventricular systolic function is mildly reduced. Left Atrium: Left atrial size was mildly dilated. Right Atrium: Right atrial size was normal in size. Pericardium: There is no evidence of  pericardial effusion. Mitral Valve: The mitral valve is normal in structure. There is mild calcification of the mitral valve leaflet(s). Mild mitral annular calcification. Mild mitral valve regurgitation. No evidence of mitral valve stenosis. MV peak gradient, 6.0 mmHg. The mean mitral valve gradient is 1.0 mmHg. Tricuspid Valve: The tricuspid valve is normal in structure. Tricuspid valve regurgitation is mild. Aortic Valve: The aortic valve is tricuspid. There is moderate calcification of the aortic valve. Aortic valve regurgitation is trivial. Aortic valve sclerosis/calcification is present, without any evidence of aortic stenosis. Aortic valve mean gradient measures 4.3 mmHg. Aortic valve peak gradient measures 7.8 mmHg. Aortic valve area, by VTI measures 1.59 cm. Pulmonic Valve: The pulmonic valve was normal in structure. Pulmonic valve regurgitation is trivial. Aorta: Aortic dilatation noted. There is mild dilatation of the aortic root, measuring 39 mm. Venous: The IVC was not visualized. IAS/Shunts: No atrial level shunt detected by color flow Doppler.  LEFT VENTRICLE PLAX 2D LVIDd:         6.30 cm      Diastology LVIDs:         4.00 cm      LV e' medial:    9.79 cm/s LV PW:  0.80 cm      LV E/e' medial:  10.4 LV IVS:        0.90 cm      LV e' lateral:   13.70 cm/s LVOT diam:     2.00 cm      LV E/e' lateral: 7.4 LV SV:         32 LV SV Index:   14 LVOT Area:     3.14 cm  LV Volumes (MOD) LV vol d, MOD A2C: 112.0 ml LV vol d, MOD A4C: 122.0 ml LV vol s, MOD A2C: 44.8 ml LV vol s, MOD A4C: 47.6 ml LV SV MOD A2C:     67.2 ml LV SV MOD A4C:     122.0 ml LV SV MOD BP:      68.8 ml RIGHT VENTRICLE RV Basal diam:  3.40 cm RV Mid diam:    2.30 cm RV S prime:     9.17 cm/s LEFT ATRIUM             Index        RIGHT ATRIUM           Index LA diam:        5.30 cm 2.40 cm/m   RA Area:     16.60 cm LA Vol (A2C):   48.1 ml 21.81 ml/m  RA Volume:   35.00 ml  15.87 ml/m LA Vol (A4C):   53.6 ml 24.30 ml/m LA  Biplane Vol: 54.3 ml 24.62 ml/m  AORTIC VALVE                    PULMONIC VALVE AV Area (Vmax):    1.46 cm     PV Vmax:       0.88 m/s AV Area (Vmean):   1.29 cm     PV Peak grad:  3.1 mmHg AV Area (VTI):     1.59 cm AV Vmax:           139.33 cm/s AV Vmean:          93.500 cm/s AV VTI:            0.200 m AV Peak Grad:      7.8 mmHg AV Mean Grad:      4.3 mmHg LVOT Vmax:         64.90 cm/s LVOT Vmean:        38.500 cm/s LVOT VTI:          0.101 m LVOT/AV VTI ratio: 0.51  AORTA Ao Root diam: 3.90 cm Ao Asc diam:  3.30 cm MITRAL VALVE                TRICUSPID VALVE MV Area (PHT): 3.66 cm     TR Peak grad:   24.4 mmHg MV Area VTI:   1.50 cm     TR Vmax:        247.00 cm/s MV Peak grad:  6.0 mmHg MV Mean grad:  1.0 mmHg     SHUNTS MV Vmax:       1.22 m/s     Systemic VTI:  0.10 m MV Vmean:      50.1 cm/s    Systemic Diam: 2.00 cm MV Decel Time: 207 msec MR Peak grad: 105.5 mmHg MR Mean grad: 69.0 mmHg MR Vmax:      513.50 cm/s MR Vmean:     388.5 cm/s MV E velocity: 102.00 cm/s [employer] Electronically signed by Archer Bear Signature  Date/Time: 06/18/2023/3:10:23 PM    Final    CT TIBIA FIBULA RIGHT WO CONTRAST Result Date: 06/17/2023 CLINICAL DATA:  right leg wound and cellulitis. EXAM: CT OF THE LOWER RIGHT EXTREMITY WITHOUT CONTRAST TECHNIQUE: Multidetector CT imaging of the right lower extremity was performed according to the standard protocol. RADIATION DOSE REDUCTION: This exam was performed according to the departmental dose-optimization program which includes automated exposure control, adjustment of the mA and/or kV according to patient size and/or use of iterative reconstruction technique. COMPARISON:  None Available. FINDINGS: Bones/Joint/Cartilage Normal alignment. No acute fracture or dislocation. 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 No osseous erosions. Ligaments Suboptimally assessed  by CT. Muscles and Tendons There is marked fatty atrophy of the visualized skeletal musculature,. There is thickening and intrasubstance calcification of the mid to distal Achilles tendon, likely the sequela of remote inflammation or trauma. No intramuscular fluid collections or intrafascial edema. Soft tissues There is extensive subcutaneous edema of the right lower extremity asymmetrically more severe along the anterolateral aspect. No loculated subcutaneous fluid collections. The edematous changes appear superficial to the investing muscular fascia. Small right knee effusion is partially visualized. IMPRESSION: 1. Extensive subcutaneous edema of the right lower extremity asymmetrically more severe along the anterolateral aspect. No loculated subcutaneous fluid collections. The edematous changes appear superficial to the investing muscular fascia. 2. No acute fracture or dislocation. No osseous erosions. 3. 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 4. 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 5. Marked fatty atrophy of the visualized skeletal musculature. 6. Thickening and intrasubstance calcification of the mid to distal Achilles tendon, likely the sequela of remote inflammation or trauma. Electronically Signed   By: Worthy Heads M.D.   On: 06/17/2023 21:30   DG Chest Port 1 View Result Date: 06/17/2023 CLINICAL DATA:  Questionable sepsis, presented for evaluation fevers at home. EXAM: PORTABLE CHEST 1 VIEW COMPARISON:  None Available. FINDINGS: There is a mildly low inspiration. There is linear atelectasis in lung bases. No focal pneumonia is evident. There is no substantial pleural effusion. There is mild cardiomegaly, without evidence of CHF. The mediastinum is normally outlined. There is calcification in the transverse aorta. Moderate thoracic spondylosis. There is arthrosis of the shoulders  and osteopenia. IMPRESSION: 1. Low inspiration with linear atelectasis in lung bases. No focal pneumonia is evident. 2. Mild cardiomegaly without evidence of CHF. 3. Aortic atherosclerosis. Electronically Signed   By: Denman Fischer M.D.   On: 06/17/2023 04:52    Labs:  Basic Metabolic Panel: Recent Labs  Lab 07/01/23 0504  NA 139  K 4.1  CL 105  CO2 22  GLUCOSE 107*  BUN 23  CREATININE 1.08  CALCIUM  8.5*    CBC: Recent Labs  Lab 07/01/23 0504  WBC 6.6  HGB 12.0*  HCT 36.3*  MCV 87.3  PLT 398    CBG: No results for input(s): "GLUCAP" in the last 168 hours.   Brief HPI:   Kairee Isa is a 89 y.o. right-handed male with history significant for atrial fibrillation with cardioversion 10/07/2021 per Dr.Tobb, hyperlipidemia, syncope hypertension pernicious anemia prostate cancer, peripheral vascular disease status post angioplasty of the right popliteal artery 05/17/2023 per Dr. Susi Eric.  Per chart review lives with spouse.  Two-level home bed and bath downstairs.  Independent with a cane and driving.  Presented 06/03/2023 with  right leg cellulitis and wound.  Patient stated since recent vascular procedure that wound has not healed and the leg remains swollen with increasing pain as per latest visit with general surgery 06/15/2023.  CT right lower extremity showed extensive subcutaneous edema of the right lower extremity asymmetrically more severe along the anterior lateral aspect.  No loculated subcutaneous fluid collection.  The edematous changes appear to be superficial to the investing muscular fascia.  Latest venous Doppler study showed no signs of DVT.  Admission chemistries unremarkable except sodium 134 glucose 134 BUN 26 creatinine 1.42 total bilirubin 1.3 lactic acid 2.8-2.4, WBC 18,900 hemoglobin A1c 6.4 blood cultures group A strep (S.  Pyogenes).  Infectious disease consulted initially placed on penicillin  and Zyvox  changed to high-dose p.o. amoxicillin  1 g p.o. 3 times daily  through 07/04/2023.  Latest blood culture showed no growth.  Echocardiogram no obvious vegetation.  Dr. Julio Ohm continue to follow wound and plan to be enrolled in venous leg ulcer study.  Wound care dressings as advised.  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  History of atrial fibrillation controlled rate on Toprol  and currently on Librexa AF trial (comparing Eliquis  versus milvexian-factor XI inhibitor).  New diagnosis diabetes mellitus type 2 hemoglobin A1c 6.4 SSI for now and plan metformin  on discharge.  Patient with persistent left knee pain is venous Doppler studies negative x-ray showed chronic arthritis placed on trial of colchicine  continue to monitor supportive care.  Uric acid levels normal.  Therapy evaluations completed due to patient decreased functional mobility was admitted for a comprehensive rehab program.   Hospital Course: Lowry Bala was admitted to rehab 06/25/2023 for inpatient therapies to consist of PT, ST and OT at least three hours five days a week. Past admission physiatrist, therapy team and rehab RN have worked together to provide customized collaborative inpatient rehab.  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  Remained stable attending therapies.  Initially on IV penicillin  and Zyvox  stopped 06/21/2023 changed to p.o. amoxicillin  with stop date 07/04/2023.  Patient follow-up infectious disease.  Pain management use of Voltaren  gel oxycodone  tramadol  as needed.  He remained on Plavix  for PAD.  Left knee pain lower extremity Dopplers negative x-rays chronic arthritis trial of colchicine  uric acid level is normal.  Hyponatremia/SIADH fluid restriction monitoring of sodium levels.  Atrial fibrillation rate controlled oral beta-blocker of Toprol .Librexa trial drug  (comparing Eliquis  versus Milvexian-factor XI inhibitor.  Follow-up cardiology services.  New diagnosis diabetes mellitus hemoglobin A1c 6.4 SSI plan to change to metformin  on discharge if needed.  Blood pressure is overall controlled with HCTZ as well as Toprol  and would need outpatient follow-up.  History of prostate cancer continued on Proscar  voiding without difficulty.  Zetia  and Crestor  ongoing for hyperlipidemia.  Bouts of constipation resolved with laxative assistance.  Patient did have intermittent bouts of headaches changed from Thorazine  as needed been quite sedating and changed to baclofen  as needed.   Blood pressures were monitored on TID basis and remained controlled and monitored  Diabetes has been monitored with ac/hs CBG checks and SSI was use prn for tighter BS control.    Rehab course: During patient's stay in rehab weekly team conferences were held to monitor patient's progress, set goals and discuss barriers to discharge. At admission, patient required +2 physical assist sit to stand.  Minimal assist side-lying to sitting  Physical exam.  Blood pressure 122/63 pulse 77 temperature 98 respirations 19 oxygen saturation is 92% room air Constitutional.  No acute distress HEENT Head.  Normocephalic and atraumatic Eyes.  Pupils round and reactive to light no discharge without nystagmus Neck.  Supple nontender no JVD without thyromegaly Cardiac regular rate and rhythm without any extra sounds or murmur heard Abdomen.  Soft nontender positive bowel sounds without rebound Respiratory effort normal no respiratory distress without wheeze Skin.  Right lower extremity wound measures about 1.5 x 4 cm in diameter generally pink and dry.  4 x 4 gauze Kerlix.  Ace wrap in place with PRAFO.  Some redness along right lateral leg which appeared mostly due to external rotation of leg/contact with bed.  Old knee incision of ACL repair on the right Neurologic.  Alert oriented x 3 normal insight and  awareness.  Cranial nerve exam unremarkable except for a disconjugate gaze.  MMT.  Bilateral upper extremities 4-4+/5.  Right lower extremity 2-3/5 hip flexors knee extension and 4 -/5 ADF PF.  Left lower extremity 3/5 HF, KE and 4+/5 ADF/PF.  Sensory exam unremarkable   Media Information   Document Information  Photos  Right leg  06/25/2023 16:20  Attached To:  Hospital Encounter on 06/25/23  Source Information  Joette Mustard, RN  [facility]  Document History      He/She  has had improvement in activity tolerance, balance, postural control as well as ability to compensate for deficits. He/She has had improvement in functional use RUE/LUE  and RLE/LLE as well as improvement in awareness.  Sessions focused on functional mobility transfers and generalized strengthening.  Stood at staircase with contact-guard and navigated eight 6 inch steps with contact-guard.  Performs all transfers rolling walker contact-guard.  Ambulates 360 feet rolling walker contact-guard fading to close supervision.  Stood from elevated surface contact-guard using rolling walker during ADLs.  Seated at sink patient able to wash upper body with supervision donned and doffed shirt and complete oral care with set up.  Patient stood with supervision using sink for upper extremity support for PERI washing with minimal assist for buttocks.  Full family teaching completed plan discharge to home.       Disposition:  Discharge disposition: 06-Home-Health Care Svc        Diet: Diabetic diet  Special Instructions: No driving smoking or alcohol   Apply 4 x 4 gauze dampened with Vashe to the right Achilles ulcer.  Applied Ace wrap from the metatarsal head up to the tibial tubercale.  Changed daily  PRAFO right lower extremity  Medications at discharge. 1.  Tylenol  as needed 2.  Plavix  75 mg p.o. daily 3.  Colchicine  0.6 mg p.o. daily 4.  Voltaren  gel 2 g 4 times daily to affected area 5.  Zetia  10 mg  p.o. daily 6.  Proscar  5 mg p.o. daily 7.  Toprol -XL 25 mg every 12 hours 8.  Oxycodone  5 mg every 8 hours as needed severe pain 9.  Protonix  40 mg p.o. daily 10.  Crestor  20 mg p.o. daily 11.Librexia-AF-apixaban  5 mg p.o. twice daily 12.Librexia-AF-JNJ/Milvexian 100 mg 1 tablet twice daily 13.  Voltaren  gel 2 g 4 times daily 14.  Vitamin D  1000 units daily 15.  Vitamin B12 5000 mcg daily 16.  Metformin  500 mg daily 17.  HCTZ 12.5 mg daily   30-35 minutes were spent completing discharge summary and discharge planning     Follow-up Information     Alessandra Ancona, Krutika  P, MD Follow up.   Specialty: Physical Medicine and Rehabilitation Why: No formal follow-up needed Contact information: [address] [place] [address] [phone]         Timothy Ford, MD Follow up.   Specialty: Orthopedic Surgery Why: Call for appointment Contact information: [address] [place] [address] [phone]         Philipp Brawn, MD Follow up.   Specialty: Vascular Surgery Why: Call for appointment Contact information: [address] [place] [address] [phone]         Terre Ferri, MD Follow up.   Specialty: Infectious Diseases Why: Call for appointment Contact information: [address] [facility] [address] [place] [address] [phone]         Jerryl Morin, DO Follow up.   Specialty: Cardiology Why: Call for appointment Contact information: [address] [place] [address] [phone]                 Signed: Sterling Eisenmenger 07/07/2023, 4:53 AM

## 2023-06-27 NOTE — Progress Notes (Signed)
 Physical Therapy Session Note  Patient Details  Name: Logan French MRN: [id number] Date of Birth: [birth year]  Today's Date: 06/27/2023 PT Individual Time: 4098-1191 PT Individual Time Calculation (min): 64 min   Short Term Goals: Week 1:  PT Short Term Goal 1 (Week 1): pt will perform STS with min a consistently PT Short Term Goal 2 (Week 1): Pt will ambulate x 30 ft with assist PT Short Term Goal 3 (Week 1): Pt will initiate stair training  Skilled Therapeutic Interventions/Progress Updates: Pt presents sitting in w/c and agreeable to therapy.  Pt wheeled to small gym w/ supervision and occasional cues, requiring A through doorways.  Pt transfers sit to stand w/ min A, but cues for sequencing, especially forward scoot and lean.  Pt amb x 3 of 55' including turn to return to seat.  Noted improvement of foot clearance and step length, but cues for posture and visual scanning.  Pt performed LE there ex for increased strength, LAQ, marching. Pt very verbose during session, but pleasant.  Pt wheeled to hallway outside of room.  Pt states need to use BR.  Pt amb to BR and min A for transfer, CGA for clothing management.  Pt continent of bowel and bladder in toilet, charted in Flowsheets.  Pt amb to w/c and remained sitting w/ chair alarm on and all needs in reach.     Therapy Documentation Precautions:  Precautions Precautions: Fall Precaution/Restrictions Comments: chronic bilateral knee pain Required Braces or Orthoses: Other Brace Other Brace: PRAFO RLE when in bed Restrictions Weight Bearing Restrictions Per Provider Order: No General:   Vital Signs:   Pain:0/10, c/o discomfort in B calves from stretching. Pain Assessment Pain Scale: 0-10 Pain Score: 0-No pain     Therapy/Group: Individual Therapy  [name] P [name] 06/27/2023, 12:30 PM

## 2023-06-27 NOTE — Plan of Care (Signed)
  Problem: Consults Goal: RH GENERAL PATIENT EDUCATION Description: See Patient Education module for education specifics. Outcome: Progressing   Problem: RH BOWEL ELIMINATION Goal: RH STG MANAGE BOWEL WITH ASSISTANCE Description: STG Manage Bowel with mod I Assistance. Outcome: Progressing Goal: RH STG MANAGE BOWEL W/MEDICATION W/ASSISTANCE Description: STG Manage Bowel with Medication with mod I Assistance. Outcome: Progressing   Problem: RH BLADDER ELIMINATION Goal: RH STG MANAGE BLADDER WITH ASSISTANCE Description: STG Manage Bladder With mdo I  Assistance Outcome: Progressing Goal: RH STG MANAGE BLADDER WITH MEDICATION WITH ASSISTANCE Description: STG Manage Bladder With Medication With mod I Assistance. Outcome: Progressing   Problem: RH SKIN INTEGRITY Goal: RH STG SKIN FREE OF INFECTION/BREAKDOWN Description: Manage w min assist Outcome: Progressing Goal: RH STG MAINTAIN SKIN INTEGRITY WITH ASSISTANCE Description: STG Maintain Skin Integrity With min Assistance. Outcome: Progressing Goal: RH STG ABLE TO PERFORM INCISION/WOUND CARE W/ASSISTANCE Description: STG Able To Perform Incision/Wound Care With min  Assistance. Outcome: Progressing   Problem: RH SAFETY Goal: RH STG ADHERE TO SAFETY PRECAUTIONS W/ASSISTANCE/DEVICE Description: STG Adhere to Safety Precautions With cues Assistance/Device. Outcome: Progressing   Problem: RH PAIN MANAGEMENT Goal: RH STG PAIN MANAGED AT OR BELOW PT'S PAIN GOAL Description: < 4 with prns Outcome: Progressing   Problem: RH KNOWLEDGE DEFICIT GENERAL Goal: RH STG INCREASE KNOWLEDGE OF SELF CARE AFTER HOSPITALIZATION Description: Manage care using educational resources for medications, skin care and dietary modifications independently Outcome: Progressing

## 2023-06-27 NOTE — Progress Notes (Signed)
 PROGRESS NOTE   Subjective/Complaints: Discussed that CBGs have been well controlled, d/c ISS and CBG checks Discussed getting protein from meals and d/ced feeding supplement  ROS: +creakiness in his joints- improved Objective:   No results found. No results for input(s): "WBC", "HGB", "HCT", "PLT" in the last 72 hours. No results for input(s): "NA", "K", "CL", "CO2", "GLUCOSE", "BUN", "CREATININE", "CALCIUM" in the last 72 hours.   Intake/Output Summary (Last 24 hours) at 06/27/2023 1540 Last data filed at 06/27/2023 1306 Gross per 24 hour  Intake 220 ml  Output 725 ml  Net -505 ml        Physical Exam: Vital Signs Blood pressure 119/76, pulse 77, temperature 98 F (36.7 C), temperature source Oral, resp. rate 18, height 6' (1.829 m), weight 98.8 kg, SpO2 99%.  Physical Exam Constitutional:      Appearance: He is not ill-appearing.     Comments: Persistent hiccups  HENT:     Right Ear: External ear normal.     Left Ear: External ear normal.     Nose: Nose normal.     Mouth/Throat:     Mouth: Mucous membranes are moist.  Eyes:     Comments: Right eye wanders  Cardiovascular:     Rate and Rhythm: Normal rate.     Heart sounds: No murmur heard.    No gallop.  Pulmonary:     Effort: Pulmonary effort is normal. No respiratory distress.     Breath sounds: No wheezing.  Abdominal:     General: There is no distension.     Palpations: Abdomen is soft.     Tenderness: There is no abdominal tenderness.  Musculoskeletal:        General: Swelling present.     Cervical back: Normal range of motion.     Right lower leg: Edema present.     Left lower leg: Edema present.     Comments: Chronic changes to both knees with mild effusions bilaterally.   Skin:    General: Skin is warm.     Comments: Right lower extremity wound measures about 1.5x 4cm in diameter and is generally pink and dry. 4x4 gauze, kerlix, ACE wrap in  place with PRAFO. Some redness along right lateral leg which appears mostly due to external rotation of leg/contact with bed. Old knee incision from ACL repair right.   Neurological:     Mental Status: He is alert.     Comments: Alert and oriented x 3. Normal insight and awareness. Intact Memory. Normal language and speech. Cranial nerve exam unremarkable except for dysconjugate gaze. MMT: BUE 4 to 4+/5. RLE 2-3/5 HF, KE and 4-/5 ADF/PF. LLE 3/5 HF, KE and 4+/5 ADF/PF. Sensory exam normal for light touch and pain in all 4 limbs. No limb ataxia or cerebellar signs. No abnormal tone appreciated.  Stable 4/13      Psychiatric:        Mood and Affect: Mood normal.        Behavior: Behavior normal.   Assessment/Plan: 1. Functional deficits which require 3+ hours per day of interdisciplinary therapy in a comprehensive inpatient rehab setting. Physiatrist is providing close team supervision and 24  hour management of active medical problems listed below. Physiatrist and rehab team continue to assess barriers to discharge/monitor patient progress toward functional and medical goals  Care Tool:  Bathing    Body parts bathed by patient: Right arm, Left arm, Chest, Abdomen, Front perineal area, Right upper leg, Left upper leg, Right lower leg, Left lower leg, Face   Body parts bathed by helper: Buttocks     Bathing assist Assist Level: Minimal Assistance - Patient > 75%     Upper Body Dressing/Undressing Upper body dressing   What is the patient wearing?: Pull over shirt    Upper body assist Assist Level: Supervision/Verbal cueing    Lower Body Dressing/Undressing Lower body dressing      What is the patient wearing?: Incontinence brief, Pants     Lower body assist Assist for lower body dressing: Maximal Assistance - Patient 25 - 49%     Toileting Toileting    Toileting assist Assist for toileting: Maximal Assistance - Patient 25 - 49%     Transfers Chair/bed  transfer  Transfers assist     Chair/bed transfer assist level: Minimal Assistance - Patient > 75%     Locomotion Ambulation   Ambulation assist      Assist level: Minimal Assistance - Patient > 75% Assistive device: Walker-rolling Max distance: 55   Walk 10 feet activity   Assist     Assist level: Minimal Assistance - Patient > 75% Assistive device: Walker-rolling   Walk 50 feet activity   Assist Walk 50 feet with 2 turns activity did not occur: Safety/medical concerns  Assist level: Minimal Assistance - Patient > 75% Assistive device: Walker-rolling    Walk 150 feet activity   Assist Walk 150 feet activity did not occur: Safety/medical concerns         Walk 10 feet on uneven surface  activity   Assist Walk 10 feet on uneven surfaces activity did not occur: Safety/medical concerns         Wheelchair     Assist Is the patient using a wheelchair?: Yes Type of Wheelchair: Manual    Wheelchair assist level: Supervision/Verbal cueing Max wheelchair distance: 150 ft    Wheelchair 50 feet with 2 turns activity    Assist        Assist Level: Supervision/Verbal cueing   Wheelchair 150 feet activity     Assist      Assist Level: Supervision/Verbal cueing   Blood pressure 119/76, pulse 77, temperature 98 F (36.7 C), temperature source Oral, resp. rate 18, height 6' (1.829 m), weight 98.8 kg, SpO2 99%.  Medical Problem List and Plan: 1. 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  Antithrombotics: -DVT/anticoagulation:  Mechanical: Antiembolism stockings, thigh (TED hose) Bilateral lower extremities             -antiplatelet therapy:  Plavix 75 mg daily 3. Pain Management: Voltaren gel 4 times daily oxycodone as needed, tramadol as needed             -might benefit from additional support (brace/sleeve) to knees. 4. Mood/Behavior/Sleep: Provide emotional support             -  antipsychotic agents: N/A 5. Neuropsych/cognition: This patient is capable of making decisions on his own behalf. 6. Skin/Wound Care:   -4 x 4 gauze dampened with Vashe to the right Achilles ulcer with kerlix and Ace wrap from the metatarsal heads up to the tubercle.   -PRAFO right foot -I reapplied the dressing today 7. Fluids/Electrolytes/Nutrition: Routine INO's with follow-up chemistries 8.  Left knee pain.  Lower extremity Dopplers negative.  X-ray showed chronic arthritis.  Trial of colchicine.  Uric acid normal limits 9.  Hyponatremia/SIADH.  Fluid restriction.  Follow-up chemistries on Monday 10.  ID/bacteremia.  Was on combination of IV penicillin and Zyvox stopped 06/21/2023 changed to p.o. amoxicillin with stop date 07/04/2023 11.  Atrial fibrillation.  Rate controlled with oral beta-blocker/Toprol-XL 25 mg every 12 hours.Librexa AF trial drug (comparing Eliquis versus milvexian-factor XI inhibitor) 12.  New diagnosis of diabetes mellitus-2.  A1c 6.4.  Currently on SSI.  Plan metformin on discharge.             -reasonable control at present 13.  Hypertension.  Continue metoprolol.  HCTZ currently on hold. 14.  History of prostate cancer.  Pro scar 5 mg daily 15.  Hyperlipidemia.  Zetia/Crestor 16.  Constipation. D/c miralax, Colace 100 mg twice daily             -last bm 4/6             -pt admittedly is not eating much             -decrease senna-docusate to HS  17.  Hiccups.  Appear to have resolved, d/c baclofen  18. Bilateral knee OA: neoprene knee sleeves ordered, metanx started  19. Vitamin D insufficiency: start D3 daily  LOS: 2 days A FACE TO FACE EVALUATION WAS PERFORMED  Logan French Logan French 06/27/2023, 3:40 PM

## 2023-06-28 DIAGNOSIS — R7881 Bacteremia: Secondary | ICD-10-CM | POA: Diagnosis not present

## 2023-06-28 DIAGNOSIS — R32 Unspecified urinary incontinence: Secondary | ICD-10-CM

## 2023-06-28 DIAGNOSIS — R7989 Other specified abnormal findings of blood chemistry: Secondary | ICD-10-CM | POA: Diagnosis not present

## 2023-06-28 DIAGNOSIS — I10 Essential (primary) hypertension: Secondary | ICD-10-CM

## 2023-06-28 DIAGNOSIS — E871 Hypo-osmolality and hyponatremia: Secondary | ICD-10-CM | POA: Diagnosis not present

## 2023-06-28 DIAGNOSIS — K5900 Constipation, unspecified: Secondary | ICD-10-CM

## 2023-06-28 LAB — COMPREHENSIVE METABOLIC PANEL WITH GFR
ALT: 65 U/L — ABNORMAL HIGH (ref 0–44)
AST: 91 U/L — ABNORMAL HIGH (ref 15–41)
Albumin: 1.9 g/dL — ABNORMAL LOW (ref 3.5–5.0)
Alkaline Phosphatase: 191 U/L — ABNORMAL HIGH (ref 38–126)
Anion gap: 10 (ref 5–15)
BUN: 27 mg/dL — ABNORMAL HIGH (ref 8–23)
CO2: 26 mmol/L (ref 22–32)
Calcium: 8.5 mg/dL — ABNORMAL LOW (ref 8.9–10.3)
Chloride: 100 mmol/L (ref 98–111)
Creatinine, Ser: 0.93 mg/dL (ref 0.61–1.24)
GFR, Estimated: >60 mL/min (ref >60)
Glucose, Bld: 100 mg/dL — ABNORMAL HIGH (ref 70–99)
Potassium: 4 mmol/L (ref 3.5–5.1)
Sodium: 136 mmol/L (ref 135–145)
Total Bilirubin: 1.5 mg/dL — ABNORMAL HIGH (ref 0.0–1.2)
Total Protein: 5.5 g/dL — ABNORMAL LOW (ref 6.5–8.1)

## 2023-06-28 LAB — CBC WITH DIFFERENTIAL/PLATELET
Abs Immature Granulocytes: 0.19 10*3/uL — ABNORMAL HIGH (ref 0.00–0.07)
Basophils Absolute: 0.1 10*3/uL (ref 0.0–0.1)
Basophils Relative: 1 %
Eosinophils Absolute: 0.2 10*3/uL (ref 0.0–0.5)
Eosinophils Relative: 2 %
HCT: 38.9 % — ABNORMAL LOW (ref 39.0–52.0)
Hemoglobin: 12.6 g/dL — ABNORMAL LOW (ref 13.0–17.0)
Immature Granulocytes: 2 %
Lymphocytes Relative: 17 %
Lymphs Abs: 1.5 10*3/uL (ref 0.7–4.0)
MCH: 28.2 pg (ref 26.0–34.0)
MCHC: 32.4 g/dL (ref 30.0–36.0)
MCV: 87 fL (ref 80.0–100.0)
Monocytes Absolute: 0.7 10*3/uL (ref 0.1–1.0)
Monocytes Relative: 9 %
Neutro Abs: 5.8 10*3/uL (ref 1.7–7.7)
Neutrophils Relative %: 69 %
Platelets: 338 10*3/uL (ref 150–400)
RBC: 4.47 MIL/uL (ref 4.22–5.81)
RDW: 15.9 % — ABNORMAL HIGH (ref 11.5–15.5)
WBC: 8.4 10*3/uL (ref 4.0–10.5)
nRBC: 0 % (ref 0.0–0.2)

## 2023-06-28 NOTE — Progress Notes (Signed)
 Physical Therapy Session Note  Patient Details  Name: Logan French MRN: [id number] Date of Birth: 04-30-1934  Today's Date: 06/28/2023 PT Individual Time: 6578-4696 PT Individual Time Calculation (min): 57 min   Short Term Goals: Week 1:  PT Short Term Goal 1 (Week 1): pt will perform STS with min a consistently PT Short Term Goal 2 (Week 1): Pt will ambulate x 30 ft with assist PT Short Term Goal 3 (Week 1): Pt will initiate stair training  Skilled Therapeutic Interventions/Progress Updates:      Pt seated in WC upon arrival. Pt agreeable to therapy. Pt denies any pain.   Noted order for "maintain knee sleeve B" pt reports knee braces were delivered yesterday and trialed during session but "caused utter agony" and increased swelling--pt refusing to wear.     Pt transported dependent in WC to day room. Pt performed sit to stand throughout session with RW and min A, verbal cues provided for anterior weight shift and technique.   Pt ambulated 176 feet with RW and CGA with WC in tow, verbal cues provided for upright posture, and reciprocal gait.   Pt navigated 4 6 inch steps with B HR and min A with step to gait, verbal cues provided for technqiue and sequencing-ascending leading with L LE, and descending leading with R LE.   Elevated leg with R LE elevating leg rest for edema management throughout session.   Pt discussing previous R ACL surgery, and doing water exercises for management of knee pain. Therpaist highly recommended not resuming water activities until wound heals to reduce risk of further infection. Pt verbalized understanding and agreeable.   Pt ambulated ~10 feet in room and performed ambulatory transfer to recliner with CGA. Pt seated in recliner with all needs within reach, and B LE elevated on pillows at end of session.    Therapy Documentation Precautions:  Precautions Precautions: Fall Precaution/Restrictions Comments: chronic bilateral knee pain Required  Braces or Orthoses: Other Brace Other Brace: PRAFO RLE when in bed Restrictions Weight Bearing Restrictions Per Provider Order: No  Therapy/Group: Individual Therapy  [facility] Jenney Modest, [place], DPT  06/28/2023, 7:49 AM

## 2023-06-28 NOTE — Progress Notes (Signed)
 PROGRESS NOTE   Subjective/Complaints: Pain is under good control.  Patient did have episode of bowel incontinence this morning, later was continent for large bowel movement.  Intermittent bladder incontinence noted.  Patient reports he has been doing better with using the urinal.  ROS: +creakiness in his joints- improved  Denies CP, SOB, N/V, dizziness  Objective:   No results found. Recent Labs    06/28/23 0516  WBC 8.4  HGB 12.6*  HCT 38.9*  PLT 338   Recent Labs    06/28/23 0516  NA 136  K 4.0  CL 100  CO2 26  GLUCOSE 100*  BUN 27*  CREATININE 0.93  CALCIUM 8.5*     Intake/Output Summary (Last 24 hours) at 06/28/2023 1620 Last data filed at 06/28/2023 0803 Gross per 24 hour  Intake 240 ml  Output 675 ml  Net -435 ml        Physical Exam: Vital Signs Blood pressure 127/71, pulse 82, temperature 97.7 F (36.5 C), resp. rate 16, height 6' (1.829 m), weight 98.8 kg, SpO2 98%.  Physical Exam Constitutional:      Appearance: Appears comfortable laying in bed    Comments: Persistent hiccups  HENT:     Right Ear: External ear normal.     Left Ear: External ear normal.     Nose: Nose normal.     Mouth/Throat:     Mouth: Mucous membranes are moist.  Eyes:     Comments: Right eye wanders  Cardiovascular:     Rate and Rhythm: Normal rate.     Heart sounds: No murmur heard.    No gallop.  Pulmonary:     Effort: CTAB. Pulmonary effort is normal. No respiratory distress.     Breath sounds: No wheezing.  Abdominal:     General: There is no distension.     Palpations: Abdomen is soft.     Tenderness: There is no abdominal tenderness.  Musculoskeletal:        General: Swelling present.     Cervical back: Normal range of motion.     Right lower leg: Edema present.     Left lower leg: Edema present.     Comments: Chronic changes to both knees with mild effusions bilaterally.   Skin:    General: Skin  is warm.     Comments: Right lower extremity wound measures about 1.5x 4cm in diameter and is generally pink and dry. 4x4 gauze, kerlix, ACE wrap in place with PRAFO. Some redness along right lateral leg which appears mostly due to external rotation of leg/contact with bed. Old knee incision from ACL repair right.   Neurological:     Mental Status: He is alert.     Comments: Alert and oriented x 3. Normal insight and awareness. Intact Memory. Normal language and speech. Cranial nerve exam unremarkable except for dysconjugate gaze. MMT: BUE 4 to 4+/5. RLE 2-3/5 HF, KE and 4-/5 ADF/PF. LLE 3/5 HF, KE and 4+/5 ADF/PF. Sensory exam normal for light touch and pain in all 4 limbs. No limb ataxia or cerebellar signs. No abnormal tone appreciated.  Stable 4/13      Psychiatric:  Mood and Affect: Mood normal.        Behavior: Behavior normal.   Assessment/Plan: 1. Functional deficits which require 3+ hours per day of interdisciplinary therapy in a comprehensive inpatient rehab setting. Physiatrist is providing close team supervision and 24 hour management of active medical problems listed below. Physiatrist and rehab team continue to assess barriers to discharge/monitor patient progress toward functional and medical goals  Care Tool:  Bathing    Body parts bathed by patient: Right arm, Left arm, Chest, Abdomen, Front perineal area, Right upper leg, Left upper leg, Right lower leg, Left lower leg, Face   Body parts bathed by helper: Buttocks     Bathing assist Assist Level: Minimal Assistance - Patient > 75%     Upper Body Dressing/Undressing Upper body dressing   What is the patient wearing?: Pull over shirt    Upper body assist Assist Level: Supervision/Verbal cueing    Lower Body Dressing/Undressing Lower body dressing      What is the patient wearing?: Incontinence brief, Pants     Lower body assist Assist for lower body dressing: Maximal Assistance - Patient 25 - 49%      Toileting Toileting    Toileting assist Assist for toileting: Maximal Assistance - Patient 25 - 49%     Transfers Chair/bed transfer  Transfers assist     Chair/bed transfer assist level: Minimal Assistance - Patient > 75%     Locomotion Ambulation   Ambulation assist      Assist level: Minimal Assistance - Patient > 75% Assistive device: Walker-rolling Max distance: 55   Walk 10 feet activity   Assist     Assist level: Minimal Assistance - Patient > 75% Assistive device: Walker-rolling   Walk 50 feet activity   Assist Walk 50 feet with 2 turns activity did not occur: Safety/medical concerns  Assist level: Minimal Assistance - Patient > 75% Assistive device: Walker-rolling    Walk 150 feet activity   Assist Walk 150 feet activity did not occur: Safety/medical concerns         Walk 10 feet on uneven surface  activity   Assist Walk 10 feet on uneven surfaces activity did not occur: Safety/medical concerns         Wheelchair     Assist Is the patient using a wheelchair?: Yes Type of Wheelchair: Manual    Wheelchair assist level: Supervision/Verbal cueing Max wheelchair distance: 150 ft    Wheelchair 50 feet with 2 turns activity    Assist        Assist Level: Supervision/Verbal cueing   Wheelchair 150 feet activity     Assist      Assist Level: Supervision/Verbal cueing   Blood pressure 127/71, pulse 82, temperature 97.7 F (36.5 C), resp. rate 16, height 6' (1.829 m), weight 98.8 kg, SpO2 98%.  Medical Problem List and Plan: 1. 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   Antithrombotics: -DVT/anticoagulation:  Mechanical: Antiembolism stockings, thigh (TED hose) Bilateral lower extremities             -antiplatelet therapy: Plavix 75 mg daily 3. Pain Management: Voltaren gel 4  times daily oxycodone as needed, tramadol as needed             -might benefit from additional support (brace/sleeve) to knees. 4. Mood/Behavior/Sleep: Provide emotional support             -antipsychotic agents: N/A 5. Neuropsych/cognition: This patient is capable of making decisions on his own behalf. 6. Skin/Wound Care:   -4 x 4 gauze dampened with Vashe to the right Achilles ulcer with kerlix and Ace wrap from the metatarsal heads up to the tubercle.   -PRAFO right foot -I reapplied the dressing today 7. Fluids/Electrolytes/Nutrition: Routine INO's with follow-up chemistries 8.  Left knee pain.  Lower extremity Dopplers negative.  X-ray showed chronic arthritis.  Trial of colchicine.  Uric acid normal limits 9.  Hyponatremia/SIADH.  Fluid restriction.    4/14 stable Na 136 10.  ID/bacteremia.  Was on combination of IV penicillin and Zyvox stopped 06/21/2023 changed to p.o. amoxicillin with stop date 07/04/2023 11.  Atrial fibrillation.  Rate controlled with oral beta-blocker/Toprol-XL 25 mg every 12 hours.Librexa AF trial drug (comparing Eliquis versus milvexian-factor XI inhibitor) 12.  New diagnosis of diabetes mellitus-2.  A1c 6.4.  Currently on SSI.  Plan metformin on discharge.             -reasonable control at present  CBG (last 3)  Recent Labs    06/26/23 2120 06/27/23 0625 06/27/23 1138  GLUCAP 144* 98 142*    13.  Hypertension.  Continue metoprolol.  HCTZ currently on hold.    06/28/2023   12:41 PM 06/28/2023    5:25 AM 06/27/2023    9:03 PM  Vitals with BMI  Systolic 127 142 098  Diastolic 71 83 81  Pulse 82 60 84  Overall stable/controlled  14.  History of prostate cancer.  Pro scar 5 mg daily 15.  Hyperlipidemia.  Zetia/Crestor 16.  Constipation. D/c  miralax, Colace 100 mg twice daily             -last bm 4/6             -pt admittedly is not eating much             -decrease senna-docusate to HS  -4/14 LBM today, improved  17.  Hiccups.  Appear to have resolved, d/c baclofen  18. Bilateral knee OA: neoprene knee sleeves ordered, metanx started  19. Vitamin D insufficiency: start D3 daily  20. Elevated LFTs  -Recheck Thrusday  21. Bowel and bladder incontinence  -Check bladder scan/PVR  LOS: 3 days A FACE TO FACE EVALUATION WAS PERFORMED  Lylia Sand 06/28/2023, 4:20 PM

## 2023-06-28 NOTE — IPOC Note (Signed)
 Overall Plan of Care [facility]) Patient Details Name: Logan French MRN: [id number] DOB: 10-18-1934  Admitting Diagnosis: Bacteremia  Hospital Problems: Principal Problem:   Bacteremia     Functional Problem List: Nursing Pain, Bowel, Bladder, Safety, Endurance, Skin Integrity, Medication Management  PT Balance, Nutrition, Skin Integrity, Behavior, Pain, Edema, Endurance, Safety, Motor  OT Balance, Edema, Endurance, Motor, Pain  SLP    TR         Basic ADL's: OT Bathing, Dressing, Toileting, Grooming     Advanced  ADL's: OT       Transfers: PT Bed Mobility, Bed to Chair, Customer service manager, Tub/Shower     Locomotion: PT Ambulation, Psychologist, prison and probation services, Stairs     Additional Impairments: OT None  SLP        TR      Anticipated Outcomes Item Anticipated Outcome  Self Feeding Mod I  Swallowing      Basic self-care  Mod I, supervision  Toileting  Mod I   Bathroom Transfers Mod I  Bowel/Bladder  manage bowel and bladder w mod I assist  Transfers  supervision transfers  Locomotion  supervision short distance gait  Communication     Cognition     Pain  manage pain < 4 with prns  Safety/Judgment  manage safety w cues   Therapy Plan: PT Intensity: Minimum of 1-2 x/day ,45 to 90 minutes PT Frequency: 5 out of 7 days PT Duration Estimated Length of Stay: 10-12 days OT Intensity: Minimum of 1-2 x/day, 45 to 90 minutes OT Frequency: 5 out of 7 days OT Duration/Estimated Length of Stay: 10-12 days     Team Interventions: Nursing Interventions Patient/Family Education, Pain Management, Medication Management, Bladder Management, Bowel Management, Discharge Planning, Skin Care/Wound Management, Disease Management/Prevention  PT interventions Ambulation/gait training, Cognitive remediation/compensation, Discharge planning, DME/adaptive equipment instruction, Functional mobility training, Pain management, Psychosocial support, Therapeutic Activities,  Splinting/orthotics, UE/LE Strength taining/ROM, Visual/perceptual remediation/compensation, Wheelchair propulsion/positioning, UE/LE Coordination activities, Therapeutic Exercise, Stair training, Skin care/wound management, Patient/family education, Neuromuscular re-education, Functional electrical stimulation, Disease management/prevention, Firefighter, Warden/ranger  OT Interventions Warden/ranger, Discharge planning, Pain management, Therapeutic Activities, Self Care/advanced ADL retraining, UE/LE Coordination activities, Disease mangement/prevention, Functional mobility training, Patient/family education, Skin care/wound managment, Therapeutic Exercise, DME/adaptive equipment instruction, Neuromuscular re-education, Splinting/orthotics, UE/LE Strength taining/ROM, Wheelchair propulsion/positioning  SLP Interventions    TR Interventions    SW/CM Interventions Discharge Planning, Psychosocial Support, Patient/Family Education   Barriers to Discharge MD  Medical stability and Incontinence  Nursing Home environment access/layout, Decreased caregiver support Multi level 2 ste, 2 step down to living/kitchen area, main B+B w spouse  PT Home environment access/layout, Decreased caregiver support, Wound Care    OT Inaccessible home environment, Home environment access/layout, Incontinence, Wound Care    SLP      SW Decreased caregiver support, Lack of/limited family support, Community education officer for SNF coverage     Team Discharge Planning: Destination: PT-Home ,OT- Home , SLP-  Projected Follow-up: PT-Home health PT, OT-  24 hour supervision/assistance, SLP-  Projected Equipment Needs: PT-To be determined, OT- To be determined, SLP-  Equipment Details: PT- , OT-  Patient/family involved in discharge planning: PT- Patient,  OT-Patient, SLP-   MD ELOS: 10-12 Medical Rehab Prognosis:  Excellent Assessment: The patient has been admitted for CIR therapies with the  diagnosis of sepsis secondary right lower extremity cellulitis/streptococcal bacteremia status post recent revascularization 05/17/2023 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 The  team will be addressing functional mobility, strength, stamina, balance, safety, adaptive techniques and equipment, self-care, bowel and bladder mgt, patient and caregiver education. Goals have been set at sup/mod I. Anticipated discharge destination is home.        See Team Conference Notes for weekly updates to the plan of care

## 2023-06-28 NOTE — Progress Notes (Signed)
 Inpatient Rehabilitation  Patient information reviewed and entered into eRehab system by [facility]. Karen Kays., CCC/SLP, PPS Coordinator.  Information including medical coding, functional ability and quality indicators will be reviewed and updated through discharge.

## 2023-06-28 NOTE — Progress Notes (Signed)
 Patient ID: [name], male   DOB: 06/15/1934, 89 y.o.   MRN: [id number]  [id number] SW left message for pt wife to introduce self, explain role, discuss discharge process, and inform on ELOS. SW requested follow up to discuss discharge plan, otherwise, SW will follow-up with updates after team conference.  Norval Been, MSW, LCSW Office: [phone] Cell: [phone] Fax: [phone]

## 2023-06-28 NOTE — Care Management (Signed)
 Inpatient Rehabilitation Center Individual Statement of Services  Patient Name:  Logan French  Date:  06/28/2023  Welcome to the Inpatient Rehabilitation Center.  Our goal is to provide you with an individualized program based on your diagnosis and situation, designed to meet your specific needs.  With this comprehensive rehabilitation program, you will be expected to participate in at least 3 hours of rehabilitation therapies Monday-Friday, with modified therapy programming on the weekends.  Your rehabilitation program will include the following services:  Physical Therapy (PT), Occupational Therapy (OT), 24 hour per day rehabilitation nursing, Therapeutic Recreaction (TR), Psychology, Neuropsychology, Care Coordinator, Rehabilitation Medicine, Nutrition Services, Pharmacy Services, and Other  Weekly team conferences will be held on Wednesdays to discuss your progress.  Your Inpatient Rehabilitation Care Coordinator will talk with you frequently to get your input and to update you on team discussions.  Team conferences with you and your family in attendance may also be held.  Expected length of stay: 10-12 days    Overall anticipated outcome: Independent with an Assistive Device  Depending on your progress and recovery, your program may change. Your Inpatient Rehabilitation Care Coordinator will coordinate services and will keep you informed of any changes. Your Inpatient Rehabilitation Care Coordinator's name and contact numbers are listed  below.  The following services may also be recommended but are not provided by the Inpatient Rehabilitation Center:  Driving Evaluations Home Health Rehabiltiation Services Outpatient Rehabilitation Services Vocational Rehabilitation   Arrangements will be made to provide these services after discharge if needed.  Arrangements include referral to agencies that provide these services.  Your insurance has been verified to be:  Medicare A/B  Your  primary doctor is:  Eilene Grater  Pertinent information will be shared with your doctor and your insurance company.  Inpatient Rehabilitation Care Coordinator:  Kathey Pang [phone] or ([phone]  Information discussed with and copy given to patient by: Rennis Case, 06/28/2023, 9:28 AM

## 2023-06-28 NOTE — Progress Notes (Signed)
 Patient ID: Logan French, male   DOB: Oct 15, 1934, 89 y.o.   MRN: [id number] Met with the patient to review current medical condition, rehab process, team conference and plan of care. Reviewed venous stasis ulcer study program w Dr. Aniceto Kern office after discharge. Discussed access to My Chart; unable to open on phone. Discussed abx through 07/04/23/wound care. Reviewed medications, HH/CMM diet and 1200 cc FR on Lasix. Also discussed new dx of DM; with CMM diet and mention of initiation of Metformin at discharge per MD. Notes hiccups are better but still present. Continue to follow along to address educational needs to facilitate preparation for discharge. Naoma Bacca

## 2023-06-29 ENCOUNTER — Ambulatory Visit (HOSPITAL_BASED_OUTPATIENT_CLINIC_OR_DEPARTMENT_OTHER): Admitting: General Surgery

## 2023-06-29 DIAGNOSIS — R7881 Bacteremia: Secondary | ICD-10-CM | POA: Diagnosis not present

## 2023-06-29 MED ORDER — FUROSEMIDE 20 MG PO TABS
10.0000 mg | ORAL_TABLET | Freq: Once | ORAL | Status: AC
  Administered 2023-06-29: 10 mg via ORAL
  Filled 2023-06-29: qty 1

## 2023-06-29 NOTE — Progress Notes (Signed)
 Occupational Therapy Session Note  Patient Details  Name: Montravious Weigelt MRN: [id number] Date of Birth: Jan 21, 1935  Today's Date: 06/29/2023 OT Individual Time: 4098-1191 OT Individual Time Calculation (min): 40 min    Short Term Goals: Week 1:  OT Short Term Goal 1 (Week 1): Pt will complete sit > stand in prep for ADL with CGA using LRAD OT Short Term Goal 2 (Week 1): Pt will engage in ADL task at sink in stance for 3 mins with min A to promote standing tolerance OT Short Term Goal 3 (Week 1): Pt will utilize AE PRN to thread LB clothing with supervision  Skilled Therapeutic Interventions/Progress Updates:  Skilled OT intervention completed with focus on ADL Retraining. Pt received upright in bed, agreeable to session. Unrated pain reported in R calf with standing; declined pharmacological intervention. OT offered rest breaks and repositioning throughout for pain reduction.  Pt reported having just used his urinal, however upon removal of covers, pt found to be heavily incontinent (either unaware or spillage of urinal) all the way from back of shoulders to his calves. Pt agreeable to get washed up and into fresh clothes due to soiled status. NT notified that pt may need assist for urinal use or OOB toileting to manage incontinence and for bed linen change.  Pt transitioned to EOB supervision with HOB elevated and use of bed rails. LLE noted to be swollen however pt declined trial of compression socks for edema management. Stood from elevated surface with CGA using RW, with a couple mins needed in stance to "stretch both calves" due to tightness, then CGA stand pivot with RW > w/c.   Seated at sink, pt was able to wash UB with supervision, donn deo/shirt and complete oral care with set up A. Pt stood with supervision using sink for UE support for peri washing with min A for buttocks (residual BM present- unsure if incontinent). Education provided on use of reacher for threading LB clothing  with pt able to do for brief/pants with supervision/cues for technique, then min A for donning over hips after supervision sit > stand at sink.   Pt remained seated in w/c with BLE elevated, with chair alarm on/activated, and with all needs in reach at end of session.   Therapy Documentation Precautions:  Precautions Precautions: Fall Precaution/Restrictions Comments: chronic bilateral knee pain Required Braces or Orthoses: Other Brace Other Brace: PRAFO RLE when in bed Restrictions Weight Bearing Restrictions Per Provider Order: No    Therapy/Group: Individual Therapy  Ruthanna Covert, MS, OTR/L  06/29/2023, 8:45 AM

## 2023-06-29 NOTE — Progress Notes (Signed)
 Physical Therapy Session Note  Patient Details  Name: Logan French MRN: [id number] Date of Birth: Mar 28, 1934  Today's Date: 06/29/2023 PT Individual Time: 2130-8657 and 8469-6295 PT Individual Time Calculation (min): 72 min and 40 min  Short Term Goals: Week 1:  PT Short Term Goal 1 (Week 1): pt will perform STS with min a consistently PT Short Term Goal 2 (Week 1): Pt will ambulate x 30 ft with assist PT Short Term Goal 3 (Week 1): Pt will initiate stair training  Skilled Therapeutic Interventions/Progress Updates:   Treatment Session 1 Received pt sitting in WC, pt agreeable to PT treatment, and denied any pain during session. Session with emphasis on functional mobility/transfers, generalized strengthening and endurance, dynamic standing balance/coordination, stair navigation, and gait training. Discussed pt's stairs at home - pt reports having 2 steps inside going from kitchen to living room with 2 handrails but too far apart to reach both. Pt also reports having 2 STE home with plan to get 1 handrail installed but recommended pt go ahead and get 2nd handrail installed during process - pt in agreement.   Pt transported to/from room in [facility] dependently for time management purposes. Stood at staircase with CGA and navigated 8 6in steps with CGA for balance. First 4 steps pt using bilateral handrails and second 4 steps using R handrail and lateral stepping technique. Pt with goo recall with "up with the good, down with the bad" technique. Pt performed all transfers with RW and CGA throughout session.   Performed standing gastroc stretch on wedge for ~30 seconds x 2 bilaterally with BUE support and CGA. Transitioned to standing gastroc stretch hanging R heel off 6in step for ~15-20 seconds x 3 trials with BUE support, then navigated additional 4 6in steps with R handrail and CGA using a lateral stepping technique.  During seated rest breaks, pt discussing the travels he has made throughout his  life.  In dayroom, pt ambulated 375ft with RW and CGA fading to close supervision - cues for upright posture/gaze. Discussed trialing rollator and/or SPC this afternoon. Discussed LOS with pt goal to be out in 10 days. Returned to room and concluded session with pt sitting in [facility] with all needs within reach. Located rollator to use this afternoon.   Treatment Session 2 Received pt sitting in WC, pt agreeable to PT treatment, and denied any pain during session. Pt required increased time to get going taking phone call, finishing up paperwork, and getting distracted telling stories, but so pleasant overall. Session with emphasis on functional mobility/transfers, generalized strengthening and endurance, dynamic standing balance/coordination, and gait training.   Educated pt on Geographical information systems officer and importance of backing rollator against stable surface prior to sitting. Pt performed all transfers with rollator and CGA fading to close supervision throughout session - occasional cues to lock brakes prior to sitting. Pt ambulated 131ft x 2 trials with rollator and CGA fading to close supervision to/from main therapy gym. Performed 2x10 standing mini squats with BUE support and close supervision with emphasis on quad strength. Pt required rest/water breaks throughout session due to mild SOB. Pt requesting to shower tomorrow with OT and scheduling notified. Returned to room and RN arrived to administer medications. Concluded session with pt sitting in [facility] with all needs within reach and RN attending to care.   Therapy Documentation Precautions:  Precautions Precautions: Fall Precaution/Restrictions Comments: chronic bilateral knee pain Required Braces or Orthoses: Other Brace Other Brace: PRAFO RLE when in bed  Restrictions Weight Bearing Restrictions Per Provider Order: No  Therapy/Group: Individual Therapy Nicolas Barren Zaunegger Nena Bank PT, DPT 06/29/2023, 6:48 AM

## 2023-06-29 NOTE — Progress Notes (Signed)
 Occupational Therapy Session Note  Patient Details  Name: Logan French MRN: [id number] Date of Birth: 1935/03/01  Today's Date: 06/29/2023 OT Individual Time: 1300-1345 OT Individual Time Calculation (min): 45 min    Short Term Goals: Week 1:  OT Short Term Goal 1 (Week 1): Pt will complete sit > stand in prep for ADL with CGA using LRAD OT Short Term Goal 2 (Week 1): Pt will engage in ADL task at sink in stance for 3 mins with min A to promote standing tolerance OT Short Term Goal 3 (Week 1): Pt will utilize AE PRN to thread LB clothing with supervision  Skilled Therapeutic Interventions/Progress Updates:    1:1 PT received in the w/c. Practiced with the Rollator walking in and out of the bathroom with contact guard. Education and return demonstration of how to lock and unlock the brakes. Pt able to perform clothing management (up and down) with contact guard. PT required A for hygiene after BM. Pt was able to retrieve pants from around the ankles from standing position safely with one Ue support on Rollator. Pt discussed wanting to discuss use of the urinal in the bed and how to manage urinating better at night. Ambulated from bathroom to bed and able to get into the bed with supervision. Pt able to get from supine with bed rail to EOB without assistance. Pt reports he usally will not sleep with his pants on. Sitting EOB practiced management of brief and positioning urinal in the correct place to be able to void in sitting position. Follow up with nursing about his positioning for this to be successful in the evening. Also encouraged pt to also call nursing to assist with bottle afterwards.   Pt practiced functional mobility with the Rollator from his room to the RN station as a new device. Also practiced and educated on parking it against a wall with brakes locks and then turning around and sitting on it with contact guard and extra time. Then stood with min A from the Rollator and turned  around using the Rollator to  return to the room. Pt left sitting up in the w/c in prep for next session.   Therapy Documentation Precautions:  Precautions Precautions: Fall Precaution/Restrictions Comments: chronic bilateral knee pain Required Braces or Orthoses: Other Brace Other Brace: PRAFO RLE when in bed Restrictions Weight Bearing Restrictions Per Provider Order: No  Pain: No c/o pain in session    Therapy/Group: Individual Therapy  [name] [facility] 06/29/2023, 1:36 PM

## 2023-06-29 NOTE — Progress Notes (Signed)
 Inpatient Rehabilitation Care Coordinator Assessment and Plan Patient Details  Name: Logan French MRN: [id number] Date of Birth: Oct 30, 1934  Today's Date: 06/29/2023  Hospital Problems: Principal Problem:   Bacteremia  Past Medical History:  Past Medical History:  Diagnosis Date   A-fib (HCC)    B12 deficiency    Diverticulosis    Fatigue    History of colonic polyps    Hyperlipemia    Hypertension    Pernicious anemia    Prostate cancer (HCC)    Syncope    Syncope    Past Surgical History:  Past Surgical History:  Procedure Laterality Date   ABDOMINAL AORTOGRAM W/LOWER EXTREMITY N/A 05/17/2023   Procedure: ABDOMINAL AORTOGRAM W/LOWER EXTREMITY;  Surgeon: Philipp Brawn, MD;  Location: [facility] INVASIVE CV LAB;  Service: Cardiovascular;  Laterality: N/A;   CARDIOVERSION N/A 10/07/2021   Procedure: CARDIOVERSION;  Surgeon: Jerryl Morin, DO;  Location: [facility] ENDOSCOPY;  Service: Cardiovascular;  Laterality: N/A;   IR ANGIO INTRA EXTRACRAN SEL COM CAROTID INNOMINATE BILAT MOD SED  09/10/2017   IR ANGIO VERTEBRAL SEL SUBCLAVIAN INNOMINATE BILAT MOD SED  09/10/2017   PERIPHERAL INTRAVASCULAR LITHOTRIPSY Right 05/17/2023   Procedure: PERIPHERAL INTRAVASCULAR LITHOTRIPSY;  Surgeon: Philipp Brawn, MD;  Location: [facility] INVASIVE CV LAB;  Service: Cardiovascular;  Laterality: Right;  SFA-POP   PERIPHERAL VASCULAR BALLOON ANGIOPLASTY Right 05/17/2023   Procedure: PERIPHERAL VASCULAR BALLOON ANGIOPLASTY;  Surgeon: Philipp Brawn, MD;  Location: [facility] INVASIVE CV LAB;  Service: Cardiovascular;  Laterality: Right;  SFA-POP   Social History:  reports that he has never smoked. He has never used smokeless tobacco. He reports current alcohol use of about 2.0 standard drinks of alcohol per week. He reports that he does not use drugs.  Family / Support Systems Marital Status: Married How Long?: since 1962 Patient Roles: Spouse Spouse/Significant Other: Haskell Linker (wife) Children: 3 children- Acupuncturist (lives in  [place]), [place] ([place]; lives in [place]); Daivd Dub (lives in [place]) Other Supports: none reported Anticipated Caregiver: wife Ability/Limitations of Caregiver: Patient reports that his wife works PT as a Systems analyst. He indicates she will be able to help. Caregiver Availability: Intermittent Family Dynamics: Pt lives with his wife.  Social History Preferred language: English Religion: Presbyterian Cultural Background: pt has been working as a Equities trader since 1964. He is not a Cytogeneticist. Education: [employer] - How often do you need to have someone help you when you read instructions, pamphlets, or other written material from your doctor or pharmacy?: Never Writes: Yes Employment Status: Employed Name of Employer: self-employed Return to Work Plans: TBD Marine scientist Issues: Denies Guardian/Conservator: Denies   Abuse/Neglect Abuse/Neglect Assessment Can Be Completed: Yes Physical Abuse: Denies Verbal Abuse: Denies Sexual Abuse: Denies Exploitation of patient/patient's resources: Denies Self-Neglect: Denies  Patient response to: Social Isolation - How often do you feel lonely or isolated from those around you?: Rarely  Emotional Status Pt's affect, behavior and adjustment status: Pt in good spirits at time of visit Recent Psychosocial Issues: Denies Psychiatric History: Denies Substance Abuse History: Pt reports occassioanl etoh use; denies tobacco products and/or rec drug use  Patient / Family Perceptions, Expectations & Goals Pt/Family understanding of illness & functional limitations: Pt has general understanding of care needs Premorbid pt/family roles/activities: INdependent Anticipated changes in roles/activities/participation: Assistance with ADLs/IADLs Pt/family expectations/goals: Pt goal is to work on getting out of here  [employer]: None Premorbid Home Care/DME Agencies:  None Transportation available at discharge:  TBD Is the patient able to respond to transportation needs?: Yes In the past 12 months, has lack of transportation kept you from medical appointments or from getting medications?: No In the past 12 months, has lack of transportation kept you from meetings, work, or from getting things needed for daily living?: No Resource referrals recommended: Neuropsychology  Discharge Planning Living Arrangements: Spouse/significant other Support Systems: Spouse/significant other Type of Residence: Private residence Insurance Resources: Electrical engineer Resources: Employment Financial Screen Referred: No Living Expenses: Banker Management: Patient, Spouse Does the patient have any problems obtaining your medications?: No Home Management: Pt wife manages all home care Patient/Family Preliminary Plans: TBD Care Coordinator Barriers to Discharge: Decreased caregiver support, Lack of/limited family support, Insurance for SNF coverage Care Coordinator Anticipated Follow Up Needs: HH/OP Expected length of stay: 10-12 days  Clinical Impression SW met with pt in room to introduce self, explain role, discuss discharge process and inform on ELOS. Pt is not a Cytogeneticist. HCPOA- wife Haskell Linker, and Geralyn Knee (son). No DME.  Weyman Bogdon A Gaje Tennyson 06/29/2023, 10:53 AM

## 2023-06-29 NOTE — Progress Notes (Signed)
 PROGRESS NOTE   Subjective/Complaints: C/o lower extremity edema, creatinine reviewed and is wnl Pain is improved, provided list of foods for pain  ROS: +creakiness in his joints- improved  Denies CP, SOB, N/V, dizziness, +lower extremity edema  Objective:   No results found. Recent Labs    06/28/23 0516  WBC 8.4  HGB 12.6*  HCT 38.9*  PLT 338   Recent Labs    06/28/23 0516  NA 136  K 4.0  CL 100  CO2 26  GLUCOSE 100*  BUN 27*  CREATININE 0.93  CALCIUM 8.5*     Intake/Output Summary (Last 24 hours) at 06/29/2023 1028 Last data filed at 06/29/2023 0800 Gross per 24 hour  Intake 476 ml  Output 1075 ml  Net -599 ml        Physical Exam: Vital Signs Blood pressure (!) 125/94, pulse 67, temperature 98.1 F (36.7 C), resp. rate 17, height 6' (1.829 m), weight 98.8 kg, SpO2 97%.  Physical Exam Constitutional:      Appearance: Appears comfortable laying in bed    Comments: Persistent hiccups  HENT:     Right Ear: External ear normal.     Left Ear: External ear normal.     Nose: Nose normal.     Mouth/Throat:     Mouth: Mucous membranes are moist.  Eyes:     Comments: Right eye wanders  Cardiovascular:     Rate and Rhythm: Normal rate.     Heart sounds: No murmur heard.    No gallop.  Pulmonary:     Effort: CTAB. Pulmonary effort is normal. No respiratory distress.     Breath sounds: No wheezing.  Abdominal:     General: There is no distension.     Palpations: Abdomen is soft.     Tenderness: There is no abdominal tenderness.  Musculoskeletal:        General: Swelling present.     Cervical back: Normal range of motion.     Right lower leg: Edema present.     Left lower leg: Edema present.     Comments: Chronic changes to both knees with mild effusions bilaterally.   Skin:    General: Skin is warm.     Comments: Right lower extremity wound measures about 1.5x 4cm in diameter and is generally  pink and dry. 4x4 gauze, kerlix, ACE wrap in place with PRAFO. Some redness along right lateral leg which appears mostly due to external rotation of leg/contact with bed. Old knee incision from ACL repair right.   Neurological:     Mental Status: He is alert.     Comments: Alert and oriented x 3. Normal insight and awareness. Intact Memory. Normal language and speech. Cranial nerve exam unremarkable except for dysconjugate gaze. MMT: BUE 4 to 4+/5. RLE 2-3/5 HF, KE and 4-/5 ADF/PF. LLE 3/5 HF, KE and 4+/5 ADF/PF. Sensory exam normal for light touch and pain in all 4 limbs. No limb ataxia or cerebellar signs. No abnormal tone appreciated.  Stable 4/15      Psychiatric:        Mood and Affect: Mood normal.        Behavior: Behavior  normal.   Assessment/Plan: 1. Functional deficits which require 3+ hours per day of interdisciplinary therapy in a comprehensive inpatient rehab setting. Physiatrist is providing close team supervision and 24 hour management of active medical problems listed below. Physiatrist and rehab team continue to assess barriers to discharge/monitor patient progress toward functional and medical goals  Care Tool:  Bathing    Body parts bathed by patient: Right arm, Left arm, Chest, Abdomen, Front perineal area, Right upper leg, Left upper leg, Right lower leg, Left lower leg, Face   Body parts bathed by helper: Buttocks     Bathing assist Assist Level: Minimal Assistance - Patient > 75%     Upper Body Dressing/Undressing Upper body dressing   What is the patient wearing?: Pull over shirt    Upper body assist Assist Level: Supervision/Verbal cueing    Lower Body Dressing/Undressing Lower body dressing      What is the patient wearing?: Incontinence brief, Pants     Lower body assist Assist for lower body dressing: Minimal Assistance - Patient > 75%     Toileting Toileting    Toileting assist Assist for toileting: Maximal Assistance - Patient 25 - 49%      Transfers Chair/bed transfer  Transfers assist     Chair/bed transfer assist level: Minimal Assistance - Patient > 75%     Locomotion Ambulation   Ambulation assist      Assist level: Minimal Assistance - Patient > 75% Assistive device: Walker-rolling Max distance: 55   Walk 10 feet activity   Assist     Assist level: Minimal Assistance - Patient > 75% Assistive device: Walker-rolling   Walk 50 feet activity   Assist Walk 50 feet with 2 turns activity did not occur: Safety/medical concerns  Assist level: Minimal Assistance - Patient > 75% Assistive device: Walker-rolling    Walk 150 feet activity   Assist Walk 150 feet activity did not occur: Safety/medical concerns         Walk 10 feet on uneven surface  activity   Assist Walk 10 feet on uneven surfaces activity did not occur: Safety/medical concerns         Wheelchair     Assist Is the patient using a wheelchair?: Yes Type of Wheelchair: Manual    Wheelchair assist level: Supervision/Verbal cueing Max wheelchair distance: 150 ft    Wheelchair 50 feet with 2 turns activity    Assist        Assist Level: Supervision/Verbal cueing   Wheelchair 150 feet activity     Assist      Assist Level: Supervision/Verbal cueing   Blood pressure (!) 125/94, pulse 67, temperature 98.1 F (36.7 C), resp. rate 17, height 6' (1.829 m), weight 98.8 kg, SpO2 97%.  Medical Problem List and Plan: 1. 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  Antithrombotics: -DVT/anticoagulation:  Mechanical: Antiembolism stockings, thigh (TED hose) Bilateral lower extremities              -antiplatelet therapy: Plavix 75 mg daily 3. Pain Management: Voltaren gel 4 times daily oxycodone as needed, tramadol as needed             -  might benefit from additional support (brace/sleeve) to knees. 4. Mood/Behavior/Sleep: Provide emotional support             -antipsychotic agents: N/A 5. Neuropsych/cognition: This patient is capable of making decisions on his own behalf. 6. Skin/Wound Care:   -4 x 4 gauze dampened with Vashe to the right Achilles ulcer with kerlix and Ace wrap from the metatarsal heads up to the tubercle.   -PRAFO right foot -I reapplied the dressing today 7. Fluids/Electrolytes/Nutrition: Routine INO's with follow-up chemistries 8.  Left knee pain.  Lower extremity Dopplers negative.  X-ray showed chronic arthritis.  Trial of colchicine.  Uric acid normal limits 9.  Hyponatremia/SIADH.  Fluid restriction.    4/14 stable Na 136 10.  ID/bacteremia.  Was on combination of IV penicillin and Zyvox stopped 06/21/2023 changed to p.o. amoxicillin with stop date 07/04/2023 11.  Atrial fibrillation.  Rate controlled with oral beta-blocker/Toprol-XL 25 mg every 12 hours.Librexa AF trial drug (comparing Eliquis versus milvexian-factor XI inhibitor) 12.  New diagnosis of diabetes mellitus-2.  A1c 6.4.  Currently on SSI.  Plan metformin on discharge.             -reasonable control at present  CBG (last 3)  Recent Labs    06/26/23 2120 06/27/23 0625 06/27/23 1138  GLUCAP 144* 98 142*    13.  Hypertension.  Continue metoprolol.  HCTZ currently on hold.    06/29/2023    4:43 AM 06/28/2023    6:22 PM 06/28/2023   12:41 PM  Vitals with BMI  Systolic 125 136 604  Diastolic 94 81 71  Pulse 67 77 82  Overall stable/controlled  14.  History of prostate cancer.  Pro scar 5 mg daily 15.  Hyperlipidemia.  Continue Zetia/Crestor  16.  Constipation. D/c miralax, Colace 100 mg twice daily             D/c senna-docusate, Last BM 4/15  17.  Hiccups.  Appear to have resolved, d/c  baclofen  18. Bilateral knee OA: neoprene knee sleeves ordered, metanx started, provided list of foods for pain  19. Vitamin D insufficiency: start D3 daily, continue  20. Elevated LFTs  -Recheck Thrusday  21. Bowel and bladder incontinence  -Check bladder scan/PVR  22. Lower extremity edema: 10mg  Lasix ordered x1  LOS: 4 days A FACE TO FACE EVALUATION WAS PERFORMED  Logan French 06/29/2023, 10:28 AM

## 2023-06-30 DIAGNOSIS — R7881 Bacteremia: Secondary | ICD-10-CM | POA: Diagnosis not present

## 2023-06-30 MED ORDER — TAMSULOSIN HCL 0.4 MG PO CAPS
0.4000 mg | ORAL_CAPSULE | Freq: Every day | ORAL | Status: DC
  Administered 2023-06-30 – 2023-07-01 (×2): 0.4 mg via ORAL
  Filled 2023-06-30 (×2): qty 1

## 2023-06-30 MED ORDER — FUROSEMIDE 20 MG PO TABS
20.0000 mg | ORAL_TABLET | Freq: Once | ORAL | Status: AC
  Administered 2023-06-30: 20 mg via ORAL
  Filled 2023-06-30: qty 1

## 2023-06-30 NOTE — Progress Notes (Addendum)
 PROGRESS NOTE   Subjective/Complaints: +lower extremity edema: 20mg  lasix ordered Read the newspaper today and did the word jumble +urinary incontinence  ROS: +creakiness in his joints- improved  Denies CP, SOB, N/V, dizziness, +lower extremity edema. +urinary incontinence  Objective:   No results found. Recent Labs    06/28/23 0516  WBC 8.4  HGB 12.6*  HCT 38.9*  PLT 338   Recent Labs    06/28/23 0516  NA 136  K 4.0  CL 100  CO2 26  GLUCOSE 100*  BUN 27*  CREATININE 0.93  CALCIUM 8.5*     Intake/Output Summary (Last 24 hours) at 06/30/2023 0928 Last data filed at 06/30/2023 0733 Gross per 24 hour  Intake 360 ml  Output 1150 ml  Net -790 ml        Physical Exam: Vital Signs Blood pressure (!) 135/95, pulse 76, temperature 98.1 F (36.7 C), temperature source Oral, resp. rate 18, height 6' (1.829 m), weight 98.8 kg, SpO2 100%.  Physical Exam Constitutional:      Appearance: Appears comfortable laying in bed    Comments: Persistent hiccups  HENT:     Right Ear: External ear normal.     Left Ear: External ear normal.     Nose: Nose normal.     Mouth/Throat:     Mouth: Mucous membranes are moist.  Eyes:     Comments: Right eye wanders  Cardiovascular:     Rate and Rhythm: Normal rate.     Heart sounds: No murmur heard.    No gallop.  Pulmonary:     Effort: CTAB. Pulmonary effort is normal. No respiratory distress.     Breath sounds: No wheezing.  Abdominal:     General: There is no distension.     Palpations: Abdomen is soft.     Tenderness: There is no abdominal tenderness.  Musculoskeletal:        General: Swelling present.     Cervical back: Normal range of motion.     Right lower leg: Edema present.     Left lower leg: Edema present.     Comments: Chronic changes to both knees with mild effusions bilaterally.   Skin:    General: Skin is warm.     Comments: Right lower extremity  wound measures about 1.5x 4cm in diameter and is generally pink and dry. 4x4 gauze, kerlix, ACE wrap in place with PRAFO. Some redness along right lateral leg which appears mostly due to external rotation of leg/contact with bed. Old knee incision from ACL repair right.   Neurological:     Mental Status: He is alert.     Comments: Alert and oriented x 3. Normal insight and awareness. Intact Memory. Normal language and speech. Cranial nerve exam unremarkable except for dysconjugate gaze. MMT: BUE 4 to 4+/5. RLE 2-3/5 HF, KE and 4-/5 ADF/PF. LLE 3/5 HF, KE and 4+/5 ADF/PF. Sensory exam normal for light touch and pain in all 4 limbs. No limb ataxia or cerebellar signs. No abnormal tone appreciated.  Stable 4/16    Psychiatric:        Mood and Affect: Mood normal.  Behavior: Behavior normal.   Assessment/Plan: 1. Functional deficits which require 3+ hours per day of interdisciplinary therapy in a comprehensive inpatient rehab setting. Physiatrist is providing close team supervision and 24 hour management of active medical problems listed below. Physiatrist and rehab team continue to assess barriers to discharge/monitor patient progress toward functional and medical goals  Care Tool:  Bathing    Body parts bathed by patient: Right arm, Left arm, Chest, Abdomen, Front perineal area, Right upper leg, Left upper leg, Right lower leg, Left lower leg, Face   Body parts bathed by helper: Buttocks     Bathing assist Assist Level: Minimal Assistance - Patient > 75%     Upper Body Dressing/Undressing Upper body dressing   What is the patient wearing?: Pull over shirt    Upper body assist Assist Level: Supervision/Verbal cueing    Lower Body Dressing/Undressing Lower body dressing      What is the patient wearing?: Incontinence brief, Pants     Lower body assist Assist for lower body dressing: Minimal Assistance - Patient > 75%     Toileting Toileting    Toileting assist Assist  for toileting: Moderate Assistance - Patient 50 - 74%     Transfers Chair/bed transfer  Transfers assist     Chair/bed transfer assist level: Contact Guard/Touching assist     Locomotion Ambulation   Ambulation assist      Assist level: Contact Guard/Touching assist Assistive device: Walker-rolling Max distance: 335ft   Walk 10 feet activity   Assist     Assist level: Contact Guard/Touching assist Assistive device: Walker-rolling   Walk 50 feet activity   Assist Walk 50 feet with 2 turns activity did not occur: Safety/medical concerns  Assist level: Contact Guard/Touching assist Assistive device: Walker-rolling    Walk 150 feet activity   Assist Walk 150 feet activity did not occur: Safety/medical concerns  Assist level: Contact Guard/Touching assist Assistive device: Walker-rolling    Walk 10 feet on uneven surface  activity   Assist Walk 10 feet on uneven surfaces activity did not occur: Safety/medical concerns         Wheelchair     Assist Is the patient using a wheelchair?: Yes Type of Wheelchair: Manual    Wheelchair assist level: Supervision/Verbal cueing Max wheelchair distance: 150 ft    Wheelchair 50 feet with 2 turns activity    Assist        Assist Level: Supervision/Verbal cueing   Wheelchair 150 feet activity     Assist      Assist Level: Supervision/Verbal cueing   Blood pressure (!) 135/95, pulse 76, temperature 98.1 F (36.7 C), temperature source Oral, resp. rate 18, height 6' (1.829 m), weight 98.8 kg, SpO2 100%.  Medical Problem List and Plan: 1. 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   Antithrombotics: -DVT/anticoagulation:  Mechanical: Antiembolism stockings, thigh (TED hose) Bilateral lower extremities             -antiplatelet therapy: continue Plavix 75 mg daily  3. Knee OA: continue Voltaren gel 4 times daily oxycodone as needed, tramadol as needed, knee sleeves ordered  4.  Mood/Behavior/Sleep: Provide emotional support             -antipsychotic agents: N/A 5. Neuropsych/cognition: This patient is capable of making decisions on his own behalf. 6. Skin/Wound Care:   -4 x 4 gauze dampened with Vashe to the right Achilles ulcer with kerlix and Ace wrap from the metatarsal heads up to the tubercle.   -PRAFO right foot -I reapplied the dressing today 7. Fluids/Electrolytes/Nutrition: Routine INO's with follow-up chemistries 8.  Left knee pain.  Lower extremity Dopplers negative.  X-ray showed chronic arthritis.  Trial of colchicine.  Uric acid normal limits 9.  Hyponatremia/SIADH.  Fluid restriction.    4/14 stable Na 136 10.  ID/bacteremia.  Was on combination of IV penicillin and Zyvox stopped 06/21/2023 changed to p.o. amoxicillin with stop date 07/04/2023 11.  Atrial fibrillation.  Rate controlled with oral beta-blocker/Toprol-XL 25 mg every 12 hours.Librexa AF trial drug (comparing Eliquis versus milvexian-factor XI inhibitor) 12.  New diagnosis of diabetes mellitus-2.  A1c 6.4.  Currently on SSI.  Plan metformin on discharge.             -reasonable control at present  CBG (last 3)  Recent Labs    06/27/23 1138  GLUCAP 142*    13.  Hypertension.  Continue metoprolol.  HCTZ currently on hold.    06/30/2023    5:00 AM 06/29/2023    7:45 PM 06/29/2023    1:13 PM  Vitals with BMI  Systolic 135 137 161  Diastolic 95 82 71  Pulse 76 68 74  Overall stable/controlled  14.  History of prostate cancer.  Pro scar 5 mg daily 15.  Hyperlipidemia.  Continue Zetia/Crestor  16.  Atelectasis: incentive spirometer ordered  17.  Hiccups.  Appear to have resolved, d/c  baclofen  18. Overweight: provide dietary education  19. Vitamin D insufficiency: start D3 daily, continue  20. Elevated LFTs  -Recheck Thrusday  21. Bowel and bladder incontinence  -d/c bladder scans since not retaining  22. Lower extremity edema: lasix 20mg  ordered x1  23. Nocturia: discussed flomax  24. HTN: add flomax after supper, lasix 20mg  ordered 4/16  26. Loose stool: avoid laxatives  LOS: 5 days A FACE TO FACE EVALUATION WAS PERFORMED  Lavell Portugal P Sargon Scouten 06/30/2023, 9:28 AM

## 2023-06-30 NOTE — Progress Notes (Addendum)
 Patient ID: Logan French, male   DOB: Apr 15, 1934, 89 y.o.   MRN: [id number]  SW met with pt in room during OT session to provide updates from team conference, and d/c date 4/23. SW discussed outpatient therapy. Prefers [facility] as closer towards his home. He is aware SW will follow-up with his wife to schedule family edu. SW will order a rollator and confirm all final DME.  1507- SW spoke with pt wife Haskell Linker to discuss above. Fam edu scheduled for Friday 1pm-3pm.   SW ordered rollator and shower chair with back and arm rests with [employer] via parachute.    Norval Been, MSW, LCSW Office: [phone] Cell: [phone] Fax: [phone]

## 2023-06-30 NOTE — Progress Notes (Signed)
 Occupational Therapy Session Note  Patient Details  Name: Logan French MRN: [id number] Date of Birth: [birth year]  Today's Date: 06/30/2023 OT Individual Time: 0920-1030 & 1420-1530 OT Individual Time Calculation (min): 70 min & 70 min   Short Term Goals: Week 1:  OT Short Term Goal 1 (Week 1): Pt will complete sit > stand in prep for ADL with CGA using LRAD OT Short Term Goal 2 (Week 1): Pt will engage in ADL task at sink in stance for 3 mins with min A to promote standing tolerance OT Short Term Goal 3 (Week 1): Pt will utilize AE PRN to thread LB clothing with supervision  Skilled Therapeutic Interventions/Progress Updates:  Session 1 Skilled OT intervention completed with focus on wound/edema management, functional ambulation, cardiovascular endurance. Pt received seated in w/c with nursing present, agreeable to session. No pain reported.  Pt requested to do shower during PM session as he just applied voltaren gel to both knees. Pt had just toileted but without pants on. Pt was able to put both legs into shorts with supervision and increased time, then stand with supervision using RW and donned over hips with supervision! LLE noted to have continued edema; pt agreeable to trial ACE wrap on LLE for managing edema management vs TEDs. MD also present for assessment. R calf with continued weeping therefore OT applied kerlix for skin integrity during therapy with total A. Discussed how his wound doctor suggested coverage of wound prior to shower which can be done in PM.  Pt completed all sit > stands and ambulatory transfers with supervision using rollator with cues needed for rollator brake management as well as intermittent CGA during turns and backwards stepping during session.  Ambulated <> gym about 150 ft x2. At the ambulatory level using rollator, pt retrieved squigz from around gym and hallway with increased time needed for scanning and noted difficulty self-scanning, requiring cues  to do so however no LOB and great endurance.  Back in room, pt's wife was present with questions regarding DC date and DME needed. Advised both of team conference and plan to provide DME recommendations/education at later date. Pt remained seated in w/c, with wife present and with all needs in reach at end of session.  Session 2 Skilled OT intervention completed with focus on ADL retraining, functional endurance, and mobility within a shower context. Pt received seated in w/c, agreeable to session. No pain reported.  Pt completed all sit > stands and ambulatory transfers with supervision while using rollator. Min cues needed for body positioning with rollator during shower transfer.  Waterproof cover applied to RLE prior to shower due to new dressing applied over wounds and per pt, wound doctor PTA advised coverage of wound at home. Pt was able to bathe all parts with intermittent supervision, at the seated level only. Utilized long handled sponge to access BLE. Ambulated > w/c. Able to donn shirt/deo with set up A. Threaded LB clothing with supervision and extra time. Discussed use of depends that doesn't have fasteners to increase independence. Donned over hips with min A for fastener management. Supervision with increased effort needed for donning LLE sock. Set up A for hair grooming.  Time spent discussing DME recommendations including shower chair with back and push up rails, as well as toilet push up rails to increase independence and safety with showers and toileting ADLs at DC. CSW notified. Pt remained seated in w/c, with chair alarm on/activated, and with all needs in reach at end of  session.   Therapy Documentation Precautions:  Precautions Precautions: Fall Precaution/Restrictions Comments: chronic bilateral knee pain Required Braces or Orthoses: Other Brace Other Brace: PRAFO RLE when in bed Restrictions Weight Bearing Restrictions Per Provider Order: No    Therapy/Group:  Individual Therapy  Ruthanna Covert, MS, OTR/L  06/30/2023, 3:42 PM

## 2023-06-30 NOTE — Plan of Care (Signed)
  Problem: RH BLADDER ELIMINATION Goal: RH STG MANAGE BLADDER WITH ASSISTANCE Description: STG Manage Bladder With mdo I  Assistance Outcome: Progressing   Problem: RH SAFETY Goal: RH STG ADHERE TO SAFETY PRECAUTIONS W/ASSISTANCE/DEVICE Description: STG Adhere to Safety Precautions With cues Assistance/Device. Outcome: Progressing   Problem: RH PAIN MANAGEMENT Goal: RH STG PAIN MANAGED AT OR BELOW PT'S PAIN GOAL Description: < 4 with prns Outcome: Progressing

## 2023-06-30 NOTE — Progress Notes (Signed)
 Physical Therapy Session Note  Patient Details  Name: Logan French MRN: [id number] Date of Birth: 04-20-1934  Today's Date: 06/30/2023 PT Individual Time: 1301-1411 PT Individual Time Calculation (min): 70 min   Short Term Goals: Week 1:  PT Short Term Goal 1 (Week 1): pt will perform STS with min a consistently PT Short Term Goal 2 (Week 1): Pt will ambulate x 30 ft with assist PT Short Term Goal 3 (Week 1): Pt will initiate stair training  Skilled Therapeutic Interventions/Progress Updates:   Received pt sitting in WC, pt agreeable to PT treatment, and denied any pain during session. Pt requesting to report events from today to therapist and required increased time to initiate mobility this session. Session with emphasis on functional mobility/transfers, toileting, generalized strengthening and endurance, dynamic standing balance/coordination, stair navigation, and gait training. Pt performed all transfers with rollator and CGA/close supervision throughout session.   Pt ambulated ~240ft with rollator and supervision (turned around halfway through and returned to room due to urge to void). Pt ambulated into bathroom and unfastened shorts, then stepped out of shorts in standing per pt request with assist from therapist. Pt stood to void and performed hygiene management with CGA for balance, then returned to [facility] and donned shorts sitting with supervision and stood to pull over hips with close supervision. Pt then ambulated 141ft x 2 trials with rollator and supervision to/from main therapy gym.   Pt ambulated to staircase and navigated 12 6in steps with very close supervision. Pt navigated first 4 steps using bilateral handrails ascending and descending with a step to pattern and last 8 steps with R handrail using a lateral stepping technique. Discussed equipment for discharge with pt requesting a rollator for ambulation and RW for convenience when standing to use urinal despite recommendation  for rollator with all mobility - order placed for rollator and pt planning to order RW on his own. Also discussed OOPT vs HHPT with recommendation for OPPT. Pt reporting his goal is to be able to drive himself to/from appointments - recommended pt discuss further with MD, but informed pt that MD typically recommends starting in empty parking lot, progressing to neighborhood roads, then busier roads. Pt then performed the following standing exercises with emphasis on LE strength/ROM: -alternating marches 2x10 bilaterally -hip abduction 2x10 bilaterally -mini squats 2x10 Concluded session with pt in [facility] with all needs within reach awaiting upcoming OT session.   Therapy Documentation Precautions:  Precautions Precautions: Fall Precaution/Restrictions Comments: chronic bilateral knee pain Required Braces or Orthoses: Other Brace Other Brace: PRAFO RLE when in bed Restrictions Weight Bearing Restrictions Per Provider Order: No  Therapy/Group: Individual Therapy Nicolas Barren Zaunegger Nena Bank PT, DPT 06/30/2023, 6:50 AM

## 2023-06-30 NOTE — Progress Notes (Signed)
 Patient has increasing edema of LLE and right foot. Unable to palpate pulses due to increased swelling and required doppler. Pulses are fainter on LLE (more swollen than RLE). Patient was given 20mg  Lasix today to manage swelling. Patient doesn't report any pain or discomfort.

## 2023-06-30 NOTE — Patient Care Conference (Signed)
 Inpatient RehabilitationTeam Conference and Plan of Care Update Date: 06/30/2023   Time: 11:44 AM    Patient Name: Logan French      Medical Record Number: [id number]  Date of Birth: 1934/11/11 Sex: Male         Room/Bed: 4M04C/4M04C-01 Payor Info: Payor: MEDICARE / Plan: MEDICARE PART A AND B / Product Type: *No Product type* /    Admit Date/Time:  06/25/2023  3:39 PM  Primary Diagnosis:  Bacteremia  Hospital Problems: Principal Problem:   Bacteremia    Expected Discharge Date: Expected Discharge Date: 07/07/23  Team Members Present: Physician leading conference: Dr. Laverle Postin Social Worker Present: Norval Been, LCSWA Nurse Present: Forrestine Ike, RN PT Present: Nena Bank, PT OT Present: Tim Fontana, OT PPS Coordinator present : Jestine Moron, SLP     Current Status/Progress Goal Weekly Team Focus  Bowel/Bladder   Patient is bowel incontinent and continues to use the urinal but is inconsistent with aiming accurately.   Patient is continent/able to verbalize bowel movements and improves urinal use.   Assess patient's ability to use urinal independently/help if needed and encourage pt to verbalize the need to use the restroom Qshift.    Swallow/Nutrition/ Hydration               ADL's   Set up A UB, Min/mod A LB, max A toileting   Mod I, supervision showers   barriers- heavy incontinence, bilateral knee pain/swelling, functional endurance, dynamic standing balance    Mobility   bed moiblity mod A, transfers with RW min A, gait 162ft with RW CGA, 4 6in steps with 2 handrails min A   Mod I, supervision gait and stairs  barriers: knee pain, global weakness/deconditioning, faituge    Communication                Safety/Cognition/ Behavioral Observations               Pain   Patient reporting pain of 0/10.   Patient continues to report pain under 3/10.   Encourage patient to verbalize pain and manage pain Qshift.    Skin    Patient's R posterior ankle wound is pink with minimal serosanguinous drainage. Appears to be healing appropriately. Patient reports no pain during dressing change.   Patient's wound remains free of signs of infection.  Perform dressing change per order and assess wound Qshift.      Discharge Planning:  Pt will d/c to home with his wife. He will need to be intermittent supevision to Mod I due to limited support from wife since she works PT. SW will confirm there are no barriers to discharge.   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  Limited by bilateral knee pain and weakness.  Patient on target to meet rehab goals: yes, currently needs set u for upper body care and min assist for lower body care and min - mod assist for toileting.  Needs CGA - supervision using a rollator for ambulation and min assist for steps.  Goals for discharge set for mod I overall.  *See Care Plan and progress notes for long and short-term goals.   Revisions to Treatment Plan:  N/a   Teaching Needs: Safety, medications, skin care/wound care, transfers,toileting, etc.   Current Barriers to Discharge: Decreased caregiver support, Home enviroment access/layout, and Wound care  Possible Resolutions to Barriers: Family education Hand rail for entry to home OP follow up  services     Medical Summary Current Status: knee OA, HTN, overweight, polypharamcy, bacteremia, history of prostate cancer  Barriers to Discharge: Medical stability  Barriers to Discharge Comments: knee OA, HTN, overweight, polypharmacy, bacteremia, history of prostate cancer Possible Resolutions to Barriers/Weekly Focus: continue voltaren gel, continue to monitor BP TID, continue to provide dietary education, d/c ISS, continue amoxicillin, continue proscar   Continued Need for Acute Rehabilitation Level of Care: The patient requires daily medical management by a physician with  specialized training in physical medicine and rehabilitation for the following reasons: Direction of a multidisciplinary physical rehabilitation program to maximize functional independence : Yes Medical management of patient stability for increased activity during participation in an intensive rehabilitation regime.: Yes Analysis of laboratory values and/or radiology reports with any subsequent need for medication adjustment and/or medical intervention. : Yes   I attest that I was present, lead the team conference, and concur with the assessment and plan of the team.   Forrestine Ike B 06/30/2023, 2:07 PM

## 2023-07-01 LAB — COMPREHENSIVE METABOLIC PANEL WITH GFR
ALT: 66 U/L — ABNORMAL HIGH (ref 0–44)
AST: 69 U/L — ABNORMAL HIGH (ref 15–41)
Albumin: 2.1 g/dL — ABNORMAL LOW (ref 3.5–5.0)
Alkaline Phosphatase: 156 U/L — ABNORMAL HIGH (ref 38–126)
Anion gap: 12 (ref 5–15)
BUN: 23 mg/dL (ref 8–23)
CO2: 22 mmol/L (ref 22–32)
Calcium: 8.5 mg/dL — ABNORMAL LOW (ref 8.9–10.3)
Chloride: 105 mmol/L (ref 98–111)
Creatinine, Ser: 1.08 mg/dL (ref 0.61–1.24)
GFR, Estimated: >60 mL/min (ref >60)
Glucose, Bld: 107 mg/dL — ABNORMAL HIGH (ref 70–99)
Potassium: 4.1 mmol/L (ref 3.5–5.1)
Sodium: 139 mmol/L (ref 135–145)
Total Bilirubin: 1.3 mg/dL — ABNORMAL HIGH (ref 0.0–1.2)
Total Protein: 5.3 g/dL — ABNORMAL LOW (ref 6.5–8.1)

## 2023-07-01 LAB — CBC
HCT: 36.3 % — ABNORMAL LOW (ref 39.0–52.0)
Hemoglobin: 12 g/dL — ABNORMAL LOW (ref 13.0–17.0)
MCH: 28.8 pg (ref 26.0–34.0)
MCHC: 33.1 g/dL (ref 30.0–36.0)
MCV: 87.3 fL (ref 80.0–100.0)
Platelets: 398 10*3/uL (ref 150–400)
RBC: 4.16 MIL/uL — ABNORMAL LOW (ref 4.22–5.81)
RDW: 16.6 % — ABNORMAL HIGH (ref 11.5–15.5)
WBC: 6.6 10*3/uL (ref 4.0–10.5)
nRBC: 0 % (ref 0.0–0.2)

## 2023-07-01 MED ORDER — FUROSEMIDE 40 MG PO TABS
40.0000 mg | ORAL_TABLET | Freq: Once | ORAL | Status: AC
  Administered 2023-07-01: 40 mg via ORAL
  Filled 2023-07-01: qty 1

## 2023-07-01 NOTE — Progress Notes (Signed)
 Physical Therapy Session Note  Patient Details  Name: Logan French MRN: [id number] Date of Birth: 09/14/1934  Today's Date: 07/01/2023 PT Individual Time: 1027-2536 PT Individual Time Calculation (min): 101 min   Short Term Goals: Week 1:  PT Short Term Goal 1 (Week 1): pt will perform STS with min a consistently PT Short Term Goal 2 (Week 1): Pt will ambulate x 30 ft with assist PT Short Term Goal 3 (Week 1): Pt will initiate stair training  Skilled Therapeutic Interventions/Progress Updates:   Received pt sitting in WC with RN at bedside. Pt requesting to finish eating lunch - therefore created HEP and allowed pt time to eat. Upon returning provided pt with HEP/theraband and educated on frequency/duration/technique for the following exercises: - Seated Long Arc Quad  - 1 x daily - 7 x weekly - 3 sets - 10 reps - Seated Hip Abduction with Resistance  - 1 x daily - 7 x weekly - 3 sets - 10 reps - Seated Knee Lifts with Resistance  - 1 x daily - 7 x weekly - 3 sets - 10 reps - Seated Heel Raise  - 1 x daily - 7 x weekly - 3 sets - 10 reps - Seated Toe Raise  - 1 x daily - 7 x weekly - 3 sets - 10 reps - Standing March with Counter Support  - 1 x daily - 7 x weekly - 3 sets - 10 reps - Standing Hip Abduction with Counter Support  - 1 x daily - 7 x weekly - 3 sets - 10 reps - Mini Squat with Counter Support  - 1 x daily - 7 x weekly - 3 sets - 10 reps - Sit to Stand with Arms Crossed  - 1 x daily - 7 x weekly - 3 sets - 10 reps - Standing Knee Flexion AROM with Chair Support  - 1 x daily - 7 x weekly - 3 sets - 10 reps  Session with emphasis on functional mobility/transfers, generalized strengthening and endurance, dynamic standing balance/coordination, simulated car tranfers, community and outdoor navigation, toileting, and gait training. Pt performed all transfers with rollator and supervision throughout session. Pt ambulated 55ft with rollator and supervision to ortho gym. Pt  performed ambulatory simulated car transfer with rollator and supervision x 2 trials with increased time and effort to get legs in/out of car. Pt then ambulated 50ft on uneven surfaces (ramp) with rollator and supervision.   Pt transported outside to entrance of [facility] in WC dependently for energy conservation purposes. Pt ambulated 39ft x 1 and 140ft x 1 with rollator and supervision over uneven surfaces (concrete) and up/down mild slopes. Practiced transfers on/off various surfaces (benches of different heights). Pt then ambulated additional 155ft with rollator and supervision. Pt required frequent standing and sitting rest breaks, demonstrating mild SOB with exertion that improved with seated rest.   Pt reported urge to void and transported back to room in [facility] dependently for time management purposes. Pt requested to use RW vs rollator for ambulation in/out of bathroom. Stood with RW and supervision and ambulated in/out of bathroom with RW and supervision. Pt removed shorts/brief in standing and therapist assisted pt with stepping out of shorts while standing (per pt preference) but educated pt on importance of undressing while seated. Pt able to void while standing and performed hygiene management in standing with supervision. Donned clean brief in standing with mod A and pt sat in WC, donned shorts with set up assist,  then stood to pull shorts over hips with supervision. Pt then ambulated additional 181ft in room with RW and supervision with multiple turns to reach goal of ambulating >583ft - reached ~534ft total at end of session. Concluded session with pt sitting in [facility] with all needs within reach.   Therapy Documentation Precautions:  Precautions Precautions: Fall Precaution/Restrictions Comments: chronic bilateral knee pain Required Braces or Orthoses: Other Brace Other Brace: PRAFO RLE when in bed Restrictions Weight Bearing Restrictions Per Provider Order: No  Therapy/Group: Individual  Therapy Nicolas Barren Zaunegger Nena Bank PT, DPT 07/01/2023, 6:59 AM

## 2023-07-01 NOTE — Progress Notes (Signed)
 Occupational Therapy Session Note  Patient Details  Name: Logan French MRN: [id number] Date of Birth: 01-Mar-1935  Today's Date: 07/01/2023 OT Individual Time: 4098-1191 OT Individual Time Calculation (min): 78 min    Short Term Goals: Week 1:  OT Short Term Goal 1 (Week 1): Pt will complete sit > stand in prep for ADL with CGA using LRAD OT Short Term Goal 2 (Week 1): Pt will engage in ADL task at sink in stance for 3 mins with min A to promote standing tolerance OT Short Term Goal 3 (Week 1): Pt will utilize AE PRN to thread LB clothing with supervision  Skilled Therapeutic Interventions/Progress Updates:  Pt greeted seated EOB, pt agreeable to OT intervention.      Transfers/bed mobility/functional mobility:  Pt requested to make "laps" around his room to loosen up his Left calf at start of session. Pt requested to use RW vs rollator. Pt completed functional ambulation in room with supervision.   Pt then completed functional ambulation greater than a household distance with rollator and supervision assist.    ADLs:  Grooming: pt completed seated oral care at sink MODI.   Exercises:   Session focused global strengthening and endurance to improve activity tolerance for ADLs and functional mobility:  Pt completed step ups to 2 inch step with BUE support from RW for 1 min Pt completed toe taps on 2 inch step with BUE support with CGA ,no LOB Pt completed steps ups on airex cushion with BUE support from RW, graded task up and instructed pt to reach dynamically to place horse shoes on rim of basketball goal to challenge dynamic balance with unilateral support during IADLs/ADLs. Pt completed task with CGA and no LOB.  Pt completed x20 chest presses with 3 lb weighted ball in standing to challenge standing balance and improve global endurance Pt completed seated LAQs with level 3 theraband around calf to promote improved LB strengthening for higher level functioanl mobility   Pt  completed below interval training workout to facilitate improved BLE strength and endurance with BUE support: 1 min of standing marches 30 sec rest 1 min of calf raises 30 sec rest break 1 min of hip abd/add 30 sec rest break 1 min "donkey kick"  Ended session with pt seated in w/c with all needs within reach.           Therapy Documentation Precautions:  Precautions Precautions: Fall Precaution/Restrictions Comments: chronic bilateral knee pain Required Braces or Orthoses: Other Brace Other Brace: PRAFO RLE when in bed Restrictions Weight Bearing Restrictions Per Provider Order: No  Pain: no pain reported during session     Therapy/Group: Individual Therapy  Mollie Anger [facility] 07/01/2023, 9:47 AM

## 2023-07-01 NOTE — Progress Notes (Signed)
 PROGRESS NOTE   Subjective/Complaints: Lower extremity edema is improved today but still present, 40 of lasix ordered today, Cr normal Had a good night  ROS: +creakiness in his joints- improved  Denies CP, SOB, N/V, dizziness, +lower extremity edema- improved. +urinary incontinence  Objective:   No results found. Recent Labs    07/01/23 0504  WBC 6.6  HGB 12.0*  HCT 36.3*  PLT 398   Recent Labs    07/01/23 0504  NA 139  K 4.1  CL 105  CO2 22  GLUCOSE 107*  BUN 23  CREATININE 1.08  CALCIUM 8.5*     Intake/Output Summary (Last 24 hours) at 07/01/2023 1041 Last data filed at 07/01/2023 0805 Gross per 24 hour  Intake 956 ml  Output 200 ml  Net 756 ml        Physical Exam: Vital Signs Blood pressure 134/86, pulse 70, temperature 97.9 F (36.6 C), temperature source Oral, resp. rate 15, height 6' (1.829 m), weight 98.8 kg, SpO2 96%.  Physical Exam Constitutional:      Appearance: Appears comfortable laying in bed    Comments: Persistent hiccups  HENT:     Right Ear: External ear normal.     Left Ear: External ear normal.     Nose: Nose normal.     Mouth/Throat:     Mouth: Mucous membranes are moist.  Eyes:     Comments: Right eye wanders  Cardiovascular:     Rate and Rhythm: Normal rate.     Heart sounds: No murmur heard.    No gallop.  Pulmonary:     Effort: CTAB. Pulmonary effort is normal. No respiratory distress.     Breath sounds: No wheezing.  Abdominal:     General: There is no distension.     Palpations: Abdomen is soft.     Tenderness: There is no abdominal tenderness.  Musculoskeletal:        General: Swelling present.     Cervical back: Normal range of motion.     Right lower leg: Edema present. Improved    Left lower leg: Edema present. Improved    Comments: Chronic changes to both knees with mild effusions bilaterally.   Skin:    General: Skin is warm.     Comments: Right  lower extremity wound measures about 1.5x 4cm in diameter and is generally pink and dry. 4x4 gauze, kerlix, ACE wrap in place with PRAFO. Some redness along right lateral leg which appears mostly due to external rotation of leg/contact with bed. Old knee incision from ACL repair right.   Neurological:     Mental Status: He is alert.     Comments: Alert and oriented x 3. Normal insight and awareness. Intact Memory. Normal language and speech. Cranial nerve exam unremarkable except for dysconjugate gaze. MMT: BUE 4 to 4+/5. RLE 2-3/5 HF, KE and 4-/5 ADF/PF. LLE 3/5 HF, KE and 4+/5 ADF/PF. Sensory exam normal for light touch and pain in all 4 limbs. No limb ataxia or cerebellar signs. No abnormal tone appreciated.  Stable 4/17    Psychiatric:        Mood and Affect: Mood normal.  Behavior: Behavior normal.   Assessment/Plan: 1. Functional deficits which require 3+ hours per day of interdisciplinary therapy in a comprehensive inpatient rehab setting. Physiatrist is providing close team supervision and 24 hour management of active medical problems listed below. Physiatrist and rehab team continue to assess barriers to discharge/monitor patient progress toward functional and medical goals  Care Tool:  Bathing    Body parts bathed by patient: Right arm, Left arm, Chest, Abdomen, Front perineal area, Right upper leg, Left upper leg, Right lower leg, Left lower leg, Face, Buttocks   Body parts bathed by helper: Buttocks     Bathing assist Assist Level: Supervision/Verbal cueing     Upper Body Dressing/Undressing Upper body dressing   What is the patient wearing?: Pull over shirt    Upper body assist Assist Level: Set up assist    Lower Body Dressing/Undressing Lower body dressing      What is the patient wearing?: Pants     Lower body assist Assist for lower body dressing: Supervision/Verbal cueing     Toileting Toileting    Toileting assist Assist for toileting: Moderate  Assistance - Patient 50 - 74%     Transfers Chair/bed transfer  Transfers assist     Chair/bed transfer assist level: Supervision/Verbal cueing     Locomotion Ambulation   Ambulation assist      Assist level: Supervision/Verbal cueing Assistive device: Rollator Max distance: >166ft   Walk 10 feet activity   Assist     Assist level: Supervision/Verbal cueing Assistive device: Rollator   Walk 50 feet activity   Assist Walk 50 feet with 2 turns activity did not occur: Safety/medical concerns  Assist level: Supervision/Verbal cueing Assistive device: Rollator    Walk 150 feet activity   Assist Walk 150 feet activity did not occur: Safety/medical concerns  Assist level: Supervision/Verbal cueing Assistive device: Rollator    Walk 10 feet on uneven surface  activity   Assist Walk 10 feet on uneven surfaces activity did not occur: Safety/medical concerns         Wheelchair     Assist Is the patient using a wheelchair?: Yes Type of Wheelchair: Manual    Wheelchair assist level: Supervision/Verbal cueing Max wheelchair distance: 150 ft    Wheelchair 50 feet with 2 turns activity    Assist        Assist Level: Supervision/Verbal cueing   Wheelchair 150 feet activity     Assist      Assist Level: Supervision/Verbal cueing   Blood pressure 134/86, pulse 70, temperature 97.9 F (36.6 C), temperature source Oral, resp. rate 15, height 6' (1.829 m), weight 98.8 kg, SpO2 96%.  Medical Problem List and Plan: 1. 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  Antithrombotics: -DVT/anticoagulation:  Mechanical: Antiembolism  stockings, thigh (TED hose) Bilateral lower extremities             -antiplatelet therapy: continue Plavix 75 mg daily  3. Knee OA: continue Voltaren gel 4 times daily oxycodone as needed, tramadol as needed, knee sleeves ordered  4. Mood/Behavior/Sleep: Provide emotional support             -  antipsychotic agents: N/A 5. Neuropsych/cognition: This patient is capable of making decisions on his own behalf. 6. Skin/Wound Care:   -4 x 4 gauze dampened with Vashe to the right Achilles ulcer with kerlix and Ace wrap from the metatarsal heads up to the tubercle.   -PRAFO right foot -I reapplied the dressing today 7. Fluids/Electrolytes/Nutrition: Routine INO's with follow-up chemistries 8.  Left knee pain.  Lower extremity Dopplers negative.  X-ray showed chronic arthritis.  Trial of colchicine.  Uric acid normal limits 9.  Hyponatremia/SIADH.  Fluid restriction.    4/14 stable Na 136 10.  ID/bacteremia.  Was on combination of IV penicillin and Zyvox stopped 06/21/2023 changed to p.o. amoxicillin with stop date 07/04/2023 11.  Atrial fibrillation.  Rate controlled with oral beta-blocker/Toprol-XL 25 mg every 12 hours.Librexa AF trial drug (comparing Eliquis versus milvexian-factor XI inhibitor) 12.  New diagnosis of diabetes mellitus-2.  A1c 6.4.  Currently on SSI.  Plan metformin on discharge.             -reasonable control at present  CBG (last 3)  No results for input(s): "GLUCAP" in the last 72 hours.   13.  Hypertension.  Continue metoprolol.  HCTZ currently on hold.    07/01/2023    3:16 AM 06/30/2023    8:07 PM 06/30/2023    6:27 PM  Vitals with BMI  Systolic 134 127 098  Diastolic 86 66 81  Pulse 70 68 72  Overall stable/controlled  14.  History of prostate cancer.  Pro scar 5 mg daily 15.  Hyperlipidemia.  Continue Zetia/Crestor  16.  Atelectasis: incentive spirometer ordered  17.  Hiccups.  Appear to have resolved, d/c baclofen  18. Overweight: provide dietary  education  19. Vitamin D insufficiency: start D3 daily, continue  20. Transaminitis: d/c tylenol  21. Bowel and bladder incontinence  -d/c bladder scans since not retaining  22. Lower extremity edema: lasix 20mg  ordered x1 on 4/16, 40 ordered z1 on 4/17  23. Nocturia: discussed flomax, ordered after supper  24. HTN: add flomax after supper, lasix 20mg  ordered 4/16, 4/17 40 Lasix ordered, normalized  26. Loose stool: avoid laxatives, last BM 4/16  LOS: 6 days A FACE TO FACE EVALUATION WAS PERFORMED  Logan French P Ermal Brzozowski 07/01/2023, 10:41 AM

## 2023-07-01 NOTE — Progress Notes (Signed)
 Patient ID: Logan French, male   DOB: 09-14-1934, 89 y.o.   MRN: [id number]  SW faxed outpatient PT/OT to [facility].  Norval Been, MSW, LCSW Office: [phone] Cell: [phone] Fax: [phone]

## 2023-07-01 NOTE — Group Note (Signed)
 Patient Details Name: Benjiman Sedgwick MRN: [id number] DOB: 1934-06-30 Today's Date: 07/01/2023  Time Calculation: OT Group Time Calculation OT Group Start Time: 1000 OT Group Stop Time: 1100 OT Group Time Calculation (min): 60 min      Group Description: BUE Therex Group: Pt participated in group session with a focus on BUE strength and endurance to facilitate improved activity tolerance and strength for higher level BADLs and functional mobility tasks.    Individual level documentation: Pt first in engaged in ball tosses around the room to other group members to facilitate improved BUE coordination, strength and cardiovascular endurance for ~ 6 mins as a light warm up.   Pt engaged in seated therapeutic activity game where pts were instructed to roll large dice to see what number they rolled, once a number was determined each number correlated to an UB exercise and a number of reps. Exercises included bicep curls, upright rows, flys, chest presses and punches. Repetitions ranged from 10-20. Pt chose to use 1 lb weights during session. Eduction provided during activity of various modifications for all exercises. Education provided on the importance of deep breathing as well as determining each pts activity tolerance.   Issued all group participants a theraband HEP on the following therex to facilitate improved UB strength and endurance for higher level functional mobility tasks. Therex included:  X5 shoulder flexion  X5 bicep curls X5 shoulder horizontal ABD X5 shoulder diagonal pulls X5 shoulder extension    Issued pt written HEP to increase carryover, educated all patients on doing therex to their tolerance.    Ended session with guide deep breathing for 3 mins in conjunction with light stretches. Pt returned to room by RT.  Pain: No pain   Precautions:     Mollie Anger [facility] 07/01/2023, 12:18 PM

## 2023-07-01 NOTE — Plan of Care (Signed)
  Problem: RH BLADDER ELIMINATION Goal: RH STG MANAGE BLADDER WITH ASSISTANCE Description: STG Manage Bladder With mdo I  Assistance Outcome: Progressing   Problem: RH SKIN INTEGRITY Goal: RH STG SKIN FREE OF INFECTION/BREAKDOWN Description: Manage w min assist Outcome: Progressing Goal: RH STG MAINTAIN SKIN INTEGRITY WITH ASSISTANCE Description: STG Maintain Skin Integrity With min Assistance. Outcome: Progressing   Problem: RH SAFETY Goal: RH STG ADHERE TO SAFETY PRECAUTIONS W/ASSISTANCE/DEVICE Description: STG Adhere to Safety Precautions With cues Assistance/Device. Outcome: Progressing   Problem: RH PAIN MANAGEMENT Goal: RH STG PAIN MANAGED AT OR BELOW PT'S PAIN GOAL Description: < 4 with prns Outcome: Progressing

## 2023-07-02 ENCOUNTER — Other Ambulatory Visit (HOSPITAL_COMMUNITY): Payer: Self-pay

## 2023-07-02 MED ORDER — FUROSEMIDE 20 MG PO TABS
20.0000 mg | ORAL_TABLET | Freq: Once | ORAL | Status: AC
  Administered 2023-07-02: 20 mg via ORAL
  Filled 2023-07-02: qty 1

## 2023-07-02 NOTE — Consult Note (Addendum)
 WOC Nurse Consult Note: Reason for Consult: serum filled blister L foot  Wound type: Serum filled bullae   Pressure Injury POA: no  Measurement: see nursing flowsheet  Wound bed: large serum filled bullae dorsal foot medial aspect x 2 Drainage (amount, consistency, odor)  intact  Periwound: edema  Dressing procedure/placement/frequency: Cleanse bullae L foot with soap and water, dry and apply Xeroform gauze to cover area daily, loosely secure with Kerlix roll gauze.  Attempt to keep any pressure off area.  If and when bullae rupture and drain Xeroform will remain appropriate.    POC discussed with bedside nurse.  WOC team will not follow. Re-consult if further needs arise.   Thank you,    Powell Bar MSN, RN-BC, [employer] [phone]

## 2023-07-02 NOTE — Progress Notes (Signed)
 PROGRESS NOTE   Subjective/Complaints: C/o blisters on left foot, serum filled, not painful, wound care consulted, d/c flomax  this was recently started No other complaints  ROS: +creakiness in his joints- improved  Denies CP, SOB, N/V, dizziness, +lower extremity edema- improved. +urinary incontinence  Objective:   No results found. Recent Labs    07/01/23 0504  WBC 6.6  HGB 12.0*  HCT 36.3*  PLT 398   Recent Labs    07/01/23 0504  NA 139  K 4.1  CL 105  CO2 22  GLUCOSE 107*  BUN 23  CREATININE 1.08  CALCIUM  8.5*     Intake/Output Summary (Last 24 hours) at 07/02/2023 0915 Last data filed at 07/02/2023 0800 Gross per 24 hour  Intake 476 ml  Output --  Net 476 ml        Physical Exam: Vital Signs Blood pressure 124/81, pulse 75, temperature (!) 97.5 F (36.4 C), resp. rate 18, height 6' (1.829 m), weight 98.8 kg, SpO2 99%.  Physical Exam Constitutional:      Appearance: Appears comfortable laying in bed    Comments: Persistent hiccups  HENT:     Right Ear: External ear normal.     Left Ear: External ear normal.     Nose: Nose normal.     Mouth/Throat:     Mouth: Mucous membranes are moist.  Eyes:     Comments: Right eye wanders  Cardiovascular:     Rate and Rhythm: Normal rate.     Heart sounds: No murmur heard.    No gallop.  Pulmonary:     Effort: CTAB. Pulmonary effort is normal. No respiratory distress.     Breath sounds: No wheezing.  Abdominal:     General: There is no distension.     Palpations: Abdomen is soft.     Tenderness: There is no abdominal tenderness.  Musculoskeletal:        General: Swelling present.     Cervical back: Normal range of motion.     Right lower leg: Edema present. Improved    Left lower leg: Edema present. Improved    Comments: Chronic changes to both knees with mild effusions bilaterally.   Skin: Blisters on left foot    General: Skin is warm.      Comments: Right lower extremity wound measures about 1.5x 4cm in diameter and is generally pink and dry. 4x4 gauze, kerlix, ACE wrap in place with PRAFO. Some redness along right lateral leg which appears mostly due to external rotation of leg/contact with bed. Old knee incision from ACL repair right.   Neurological:     Mental Status: He is alert.     Comments: Alert and oriented x 3. Normal insight and awareness. Intact Memory. Normal language and speech. Cranial nerve exam unremarkable except for dysconjugate gaze. MMT: BUE 4 to 4+/5. RLE 2-3/5 HF, KE and 4-/5 ADF/PF. LLE 3/5 HF, KE and 4+/5 ADF/PF. Sensory exam normal for light touch and pain in all 4 limbs. No limb ataxia or cerebellar signs. No abnormal tone appreciated.  Stable 4/18    Psychiatric:        Mood and Affect: Mood normal.  Behavior: Behavior normal.   Assessment/Plan: 1. Functional deficits which require 3+ hours per day of interdisciplinary therapy in a comprehensive inpatient rehab setting. Physiatrist is providing close team supervision and 24 hour management of active medical problems listed below. Physiatrist and rehab team continue to assess barriers to discharge/monitor patient progress toward functional and medical goals  Care Tool:  Bathing    Body parts bathed by patient: Right arm, Left arm, Chest, Abdomen, Front perineal area, Right upper leg, Left upper leg, Right lower leg, Left lower leg, Face, Buttocks   Body parts bathed by helper: Buttocks     Bathing assist Assist Level: Supervision/Verbal cueing     Upper Body Dressing/Undressing Upper body dressing   What is the patient wearing?: Pull over shirt    Upper body assist Assist Level: Set up assist    Lower Body Dressing/Undressing Lower body dressing      What is the patient wearing?: Pants     Lower body assist Assist for lower body dressing: Supervision/Verbal cueing     Toileting Toileting    Toileting assist Assist for  toileting: Moderate Assistance - Patient 50 - 74%     Transfers Chair/bed transfer  Transfers assist     Chair/bed transfer assist level: Supervision/Verbal cueing     Locomotion Ambulation   Ambulation assist      Assist level: Supervision/Verbal cueing Assistive device: Rollator Max distance: 171ft   Walk 10 feet activity   Assist     Assist level: Supervision/Verbal cueing Assistive device: Rollator   Walk 50 feet activity   Assist Walk 50 feet with 2 turns activity did not occur: Safety/medical concerns  Assist level: Supervision/Verbal cueing Assistive device: Rollator    Walk 150 feet activity   Assist Walk 150 feet activity did not occur: Safety/medical concerns  Assist level: Supervision/Verbal cueing Assistive device: Rollator    Walk 10 feet on uneven surface  activity   Assist Walk 10 feet on uneven surfaces activity did not occur: Safety/medical concerns         Wheelchair     Assist Is the patient using a wheelchair?: Yes Type of Wheelchair: Manual    Wheelchair assist level: Supervision/Verbal cueing Max wheelchair distance: 150 ft    Wheelchair 50 feet with 2 turns activity    Assist        Assist Level: Supervision/Verbal cueing   Wheelchair 150 feet activity     Assist      Assist Level: Supervision/Verbal cueing   Blood pressure 124/81, pulse 75, temperature (!) 97.5 F (36.4 C), resp. rate 18, height 6' (1.829 m), weight 98.8 kg, SpO2 99%.  Medical Problem List and Plan: 1. 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  Antithrombotics: -DVT/anticoagulation:  Mechanical: Antiembolism  stockings, thigh (TED hose) Bilateral lower extremities             -antiplatelet therapy: continue Plavix  75 mg daily  3. Knee OA: continue Voltaren  gel 4 times daily oxycodone  as needed, tramadol  as needed, knee sleeves ordered  4. Mood/Behavior/Sleep: Provide emotional support             -  antipsychotic agents: N/A 5. Neuropsych/cognition: This patient is capable of making decisions on his own behalf. 6. Skin/Wound Care:   -4 x 4 gauze dampened with Vashe to the right Achilles ulcer with kerlix and Ace wrap from the metatarsal heads up to the tubercle.   -PRAFO right foot -I reapplied the dressing today 7. Fluids/Electrolytes/Nutrition: Routine INO's with follow-up chemistries 8.  Left knee pain.  Lower extremity Dopplers negative.  X-ray showed chronic arthritis.  Trial of colchicine .  Uric acid normal limits 9.  Hyponatremia/SIADH.  Fluid restriction.    4/14 stable Na 136 10.  ID/bacteremia.  Was on combination of IV penicillin  and Zyvox  stopped 06/21/2023 changed to p.o. amoxicillin  with stop date 07/04/2023 11.  Atrial fibrillation.  Rate controlled with oral beta-blocker/Toprol -XL 25 mg every 12 hours.Librexa AF trial drug (comparing Eliquis  versus milvexian-factor XI inhibitor) 12.  New diagnosis of diabetes mellitus-2.  A1c 6.4.  Currently on SSI.  Plan metformin  on discharge.             -reasonable control at present  CBG (last 3)  No results for input(s): GLUCAP in the last 72 hours.   13.  Hypertension.  Continue metoprolol .  HCTZ currently on hold.    07/02/2023    3:29 AM 07/01/2023    9:53 PM 07/01/2023    7:58 PM  Vitals with BMI  Systolic 124 134 865  Diastolic 81 77 77  Pulse 75 61 61  Overall stable/controlled  14.  History of prostate cancer. continue Pro scar 5 mg daily 15.  Hyperlipidemia.  Continue Zetia /Crestor   16.  Atelectasis: incentive spirometer ordered  17.  Hiccups.  Appear to have resolved, d/c baclofen   18. Overweight: provide dietary  education  19. Vitamin D  insufficiency: start D3 daily, continue  20. Transaminitis: d/c tylenol   21. Bowel and bladder incontinence  -d/c bladder scans since not retaining  22. Lower extremity edema: lasix  20mg  ordered x1 on 4/16, 40 ordered on 4/17, 20mg  lasix  ordered 4/18  23. Nocturia: d/c flomax  given blisters on left foot in case these are side effect of the medication  24. HTN: resolved, 20mg  lasix  ordered 4/18  26. Loose stool: avoid laxatives, last BM 4/17  27. Blisters on left foot: WOC consulted, d/c flomax   LOS: 7 days A FACE TO FACE EVALUATION WAS PERFORMED  Ciela Mahajan P Markeesha Char 07/02/2023, 9:15 AM

## 2023-07-02 NOTE — Progress Notes (Signed)
 Physical Therapy Session Note  Patient Details  Name: Logan French MRN: [id number] Date of Birth: [birth year]  Today's Date: 07/02/2023 PT Individual Time: 1000-1042 and 8698-8640 PT Individual Time Calculation (min): 42 min and 58 min  Short Term Goals: Week 1:  PT Short Term Goal 1 (Week 1): pt will perform STS with min a consistently PT Short Term Goal 2 (Week 1): Pt will ambulate x 30 ft with assist PT Short Term Goal 3 (Week 1): Pt will initiate stair training  Skilled Therapeutic Interventions/Progress Updates:   Treatment Session 1 Received pt sitting in WC with L foot exposed. Noted 2 quarter-sized fluid filled blisters - MD aware and wound care consulted. Pt agreeable to PT treatment and denied any pain during session. Session with emphasis on functional mobility/transfers, generalized strengthening and endurance, dynamic standing balance/coordination, simulated car transfers, and gait training. Donned L non-skid sock dependently while pt provided therapist with daily progress report.  Pt performed all transfers with rollator and supervision throughout session. Pt ambulated 46ft x 2 trials with rollator and supervision to/from ortho gym. Pt performed seated BUE/BLE strengthening on Nustep at workload 3 increasing to 4 for a total of 623 steps. Worked on pace partner program with emphasis on maintaining steady pace and cardiovascular endurance for 0.2 miles, SPM 66, and METs 2.0. Pt performed ambulatory simulated car transfer with rollator and supervision x 3 trials with increased time and effort due to difficulty flexing knees. Returned to room and ambulated additional 15ft with rollator and supervision to get extra steps in. Pt requesting [employer] number to pay copay for shower chair - CSW notified. Concluded session with pt sitting in [facility] with all needs within reach.   Treatment Session 2 Received pt toileting with RN in bathroom and wife present for family education training. Pt  agreeable to PT treatment and denied any pain during session. Session with emphasis on discharge planning, functional mobility/transfers, generalized strengthening and endurance, dynamic standing balance/coordination, simulated car transfers, stair navigation, and gait training.   Pt ambulated out of bathroom with RW and supervision. Donned pants seated in [facility] with supervision and stood to pull over hips without assist. Pt performed all transfers with rollator and supervision throughout session. Pt ambulated 51ft with rollator and supervision to ortho gym and performed ambulatory simulated car transfer with rollator and supervision, then ambulated 24ft on uneven surfaces (ramp) with rollator and supervision. Practiced backing rollator against wall and sitting x 3 trials throughout session with supervision overall.   Pt then ambulated 144ft with rollator and supervision to main therapy gym and navigated 12 6in steps with supervision. First 4 steps with bilateral handrails ascending and descending with a step to pattern, 2nd 4 steps with R handrail using a lateral stepping technique, and last 4 ascending with 2 handrails and descending with 1 handrail. Pt able to stand and pick up object from floor using reacher and rollator with supervision. Pt then ambulated aditional 439ft x 1 and 345ft x 1 with rollator and supervision to/from [employer]. Returned to room and concluded session with pt sitting in [facility] with all needs within reach and wife at bedside.   Discussed/educated pt's wife on the following: -recommendation to use rollator with mobility. However, pt prefers to use RW in bathroom and for toileting. -education officer, community for home use -technique for stair navigation  Therapy Documentation Precautions:  Precautions Precautions: Fall Precaution/Restrictions Comments: chronic bilateral knee pain Required Braces or Orthoses: Other Brace Other Brace: PRAFO RLE when in  bed Restrictions Weight Bearing  Restrictions Per Provider Order: No  Therapy/Group: Individual Therapy Therisa HERO Zaunegger Therisa Stains PT, DPT 07/02/2023, 6:51 AM

## 2023-07-02 NOTE — Plan of Care (Signed)
  Problem: Consults Goal: RH GENERAL PATIENT EDUCATION Description: See Patient Education module for education specifics. Outcome: Progressing   Problem: RH BOWEL ELIMINATION Goal: RH STG MANAGE BOWEL WITH ASSISTANCE Description: STG Manage Bowel with mod I Assistance. Outcome: Progressing Goal: RH STG MANAGE BOWEL W/MEDICATION W/ASSISTANCE Description: STG Manage Bowel with Medication with mod I Assistance. Outcome: Progressing   Problem: RH BLADDER ELIMINATION Goal: RH STG MANAGE BLADDER WITH ASSISTANCE Description: STG Manage Bladder With mdo I  Assistance Outcome: Progressing Goal: RH STG MANAGE BLADDER WITH MEDICATION WITH ASSISTANCE Description: STG Manage Bladder With Medication With mod I Assistance. Outcome: Progressing   Problem: RH SKIN INTEGRITY Goal: RH STG SKIN FREE OF INFECTION/BREAKDOWN Description: Manage w min assist Outcome: Progressing Goal: RH STG MAINTAIN SKIN INTEGRITY WITH ASSISTANCE Description: STG Maintain Skin Integrity With min Assistance. Outcome: Progressing Goal: RH STG ABLE TO PERFORM INCISION/WOUND CARE W/ASSISTANCE Description: STG Able To Perform Incision/Wound Care With min  Assistance. Outcome: Progressing   Problem: RH PAIN MANAGEMENT Goal: RH STG PAIN MANAGED AT OR BELOW PT'S PAIN GOAL Description: < 4 with prns Outcome: Progressing   Problem: RH KNOWLEDGE DEFICIT GENERAL Goal: RH STG INCREASE KNOWLEDGE OF SELF CARE AFTER HOSPITALIZATION Description: Manage care using educational resources for medications, skin care and dietary modifications independently Outcome: Progressing

## 2023-07-02 NOTE — Progress Notes (Signed)
 Occupational Therapy Session Note  Patient Details  Name: Logan French MRN: [id number] Date of Birth: Jan 16, 1935  Today's Date: 07/02/2023 OT Individual Time: 9194-9154 & 1435-1530 OT Individual Time Calculation (min): 40 min & 55 min   Short Term Goals: Week 1:  OT Short Term Goal 1 (Week 1): Pt will complete sit > stand in prep for ADL with CGA using LRAD OT Short Term Goal 2 (Week 1): Pt will engage in ADL task at sink in stance for 3 mins with min A to promote standing tolerance OT Short Term Goal 3 (Week 1): Pt will utilize AE PRN to thread LB clothing with supervision  Skilled Therapeutic Interventions/Progress Updates:  Session 1 Skilled OT intervention completed with focus on skin integrity, ADL retraining and functional mobility. Pt received seated EOB, agreeable to session. No pain reported.  Pt notified OT of 2 new large, closed, blisters on L foot along medial aspect. MD and care team notified of status.  Stayed in room until rounding by MD regarding plan for coverage. Pt requested to loosen the legs and stood with supervision using rollator, then ambulated in circles around his room several times with supervision. Seated at sink, pt bathed/dressed UB with set up A as well as oral care, then donned shorts with supervision at the sit > stand level.   Pt with urge to urinate, preferring to stand for void at commode. Ambulated with RW (preference) and supervision to toilet. Pt requested to remove all LB clothing, which he was able to do with supervision but then kicked clothing to the side. Stood for urinary void with supervision, however did have small incontinent BM in stance requiring assist to clean accident, but no assist for pericare, except to notify pt of the accident. Pt with difficulty retrieving both LB clothing items from floor level- discussed using depends that do not have fasteners for increased stability with clothing in stance to avoid having to doff  completely, use of urinal in stance, or turning to sit to re thread LB clothing vs retrieving and stepping in while in standing. Ambulated with supervision > w/c, threaded pants with supervision at the sit > stand level. Donned sock to RLE. Nurse present for meds with direct care handoff at end of session.  Session 2 Skilled OT intervention completed with focus on family education with wife present. Pt received seated in w/c, agreeable to session. No pain reported.  OT provided education on the following in prep for DC: -Recommended use of RW for all mobility with pt at the mod I ambulatory level and intermittent supervision for showers otherwise mod I -Discussed signs of infection and wound care for RLE including wrapping technique to maintain waterproof status for showers -Confirmed bathroom set up, with both reporting walk in shower with door. Advised that shower chair with arm rests would be safest considering CLOF, environmental set up and pt's balance/endurance deficits -Advised use of hand held shower head for ease of bathing. -Energy conservation strategies- lukewarm water, proper ventilation via fan or cracked door to reduce steam, minimizing stands in shower especially during hair washing with eyes closed, and planning ahead for shower tasks to be rested/have rest time following in case of fatigue -suggested use of toilet push rails around toilet to promote efficiency with standing from low seat -Suggested use of depends vs brief with fasteners to increase independency and efficiency with standing void -Encouraged pt to sit on toilet to thread LB clothing on/off if he feels the need  to do so for standing void as he has done frequently in sessions vs leaving them on floor  OT assisted pt in demonstrating the following during session: -Supervision sit > stands with rollator and RW -Supervision ambulatory transfers with rollator, up to 100 ft -CGA/supervision backwards step in transfer over  shower threshold to shower chair -Supervised toileting in stance, then for re-threading clothing at w/c at the sit > stand level without AD   Pt noted to have new blister on back of L calf that had developed since this AM; nurse notified. Pt remained seated EOB with direct care handoff to nursing at end of session for wound care.   Therapy Documentation Precautions:  Precautions Precautions: Fall Precaution/Restrictions Comments: chronic bilateral knee pain Required Braces or Orthoses: Other Brace Other Brace: PRAFO RLE when in bed Restrictions Weight Bearing Restrictions Per Provider Order: No    Therapy/Group: Individual Therapy  Lorrayne FORBES Fritter, MS, OTR/L  07/02/2023, 3:40 PM

## 2023-07-02 NOTE — Progress Notes (Signed)
 Patient ID: Logan French, male   DOB: [birth year], 89 y.o.   MRN: [id number]  SW met with pt to provide contact number for [employer] so he can make copay for DME; however, he reports he was able to make contact with [employer] and make copay.   Norval Been, MSW, LCSW Office: [phone] Cell: [phone] Fax: [phone]

## 2023-07-02 NOTE — Plan of Care (Signed)
  Problem: RH BOWEL ELIMINATION Goal: RH STG MANAGE BOWEL WITH ASSISTANCE Description: STG Manage Bowel with mod I Assistance. Outcome: Progressing   Problem: RH BLADDER ELIMINATION Goal: RH STG MANAGE BLADDER WITH ASSISTANCE Description: STG Manage Bladder With mdo I  Assistance Outcome: Progressing   Problem: RH SKIN INTEGRITY Goal: RH STG SKIN FREE OF INFECTION/BREAKDOWN Description: Manage w min assist Outcome: Progressing Goal: RH STG MAINTAIN SKIN INTEGRITY WITH ASSISTANCE Description: STG Maintain Skin Integrity With min Assistance. Outcome: Progressing

## 2023-07-03 NOTE — Progress Notes (Signed)
 Occupational Therapy Session Note  Patient Details  Name: Broc Caspers MRN: [id number] Date of Birth: [birth year]  Today's Date: 07/03/2023 OT Individual Time: 1015-1100 OT Individual Time Calculation (min): 45 min    Short Term Goals: Week 1:  OT Short Term Goal 1 (Week 1): Pt will complete sit > stand in prep for ADL with CGA using LRAD OT Short Term Goal 2 (Week 1): Pt will engage in ADL task at sink in stance for 3 mins with min A to promote standing tolerance OT Short Term Goal 3 (Week 1): Pt will utilize AE PRN to thread LB clothing with supervision  Skilled Therapeutic Interventions/Progress Updates:    Patient was seated at w/c LOF at the time of arrival. The pt had request to go outside for his OT treatment session. The pt indicated that he rested well with no pain to report at the time of treatment. The pt was transported outside via the w/c, he was able to come from sit to stand using the Rolator for additional balance with CGA. The pt was able to ambulate 2x around the sitting area adjacent to the  valet parking area with 1 rest break. The pt was able to go from standing to sitting using the rollator for placement on a seat using the armrest with CGA. The pt was able to come from sit to stand using the arms of the chair for coming into stand and transitioning to the rollator for additional balance with  CGA.  The pt returned to a seated position at w/c LOF  and was  transported back to his room.   The pt remained seated at  w/c LOF  with his call light and bedside table within reach and all additional needs address.    Therapy Documentation Precautions:  Precautions Precautions: Fall Precaution/Restrictions Comments: chronic bilateral knee pain Required Braces or Orthoses: Other Brace Other Brace: PRAFO RLE when in bed Restrictions Weight Bearing Restrictions Per Provider Order: No  Therapy/Group: Individual Therapy  Moises Ang 07/03/2023, 12:38 PM

## 2023-07-03 NOTE — Plan of Care (Signed)
  Problem: Consults Goal: RH GENERAL PATIENT EDUCATION Description: See Patient Education module for education specifics. Outcome: Progressing   Problem: RH BOWEL ELIMINATION Goal: RH STG MANAGE BOWEL WITH ASSISTANCE Description: STG Manage Bowel with mod I Assistance. Outcome: Progressing Goal: RH STG MANAGE BOWEL W/MEDICATION W/ASSISTANCE Description: STG Manage Bowel with Medication with mod I Assistance. Outcome: Progressing   Problem: RH BLADDER ELIMINATION Goal: RH STG MANAGE BLADDER WITH ASSISTANCE Description: STG Manage Bladder With mdo I  Assistance Outcome: Progressing Goal: RH STG MANAGE BLADDER WITH MEDICATION WITH ASSISTANCE Description: STG Manage Bladder With Medication With mod I Assistance. Outcome: Progressing   Problem: RH SKIN INTEGRITY Goal: RH STG SKIN FREE OF INFECTION/BREAKDOWN Description: Manage w min assist Outcome: Progressing Goal: RH STG MAINTAIN SKIN INTEGRITY WITH ASSISTANCE Description: STG Maintain Skin Integrity With min Assistance. Outcome: Progressing Goal: RH STG ABLE TO PERFORM INCISION/WOUND CARE W/ASSISTANCE Description: STG Able To Perform Incision/Wound Care With min  Assistance. Outcome: Progressing   Problem: RH PAIN MANAGEMENT Goal: RH STG PAIN MANAGED AT OR BELOW PT'S PAIN GOAL Description: < 4 with prns Outcome: Progressing   Problem: RH KNOWLEDGE DEFICIT GENERAL Goal: RH STG INCREASE KNOWLEDGE OF SELF CARE AFTER HOSPITALIZATION Description: Manage care using educational resources for medications, skin care and dietary modifications independently Outcome: Progressing   Problem: RH SAFETY Goal: RH STG ADHERE TO SAFETY PRECAUTIONS W/ASSISTANCE/DEVICE Description: STG Adhere to Safety Precautions With cues Assistance/Device. Outcome: Progressing

## 2023-07-03 NOTE — Progress Notes (Signed)
 Physical Therapy Session Note  Patient Details  Name: Logan French MRN: [id number] Date of Birth: 1934-11-16  Today's Date: 07/03/2023 PT Individual Time: 7829-5621 PT Individual Time Calculation (min): 53 min   Short Term Goals: Week 1:  PT Short Term Goal 1 (Week 1): pt will perform STS with min a consistently PT Short Term Goal 2 (Week 1): Pt will ambulate x 30 ft with assist PT Short Term Goal 3 (Week 1): Pt will initiate stair training  Skilled Therapeutic Interventions/Progress Updates:   Received pt sitting EOB, pt agreeable to PT treatment, and denied any pain during session. Pt reported not getting therapy schedule for today - wrote out pt's schedule and MD arrived for morning rounds. Discussed BLE edema and MD requesting BLE to be ace wrapped. Session with emphasis on functional mobility/transfers, generalized strengthening and endurance, dynamic standing balance/coordination, and gait training. Pt requiring increased time with mobility this morning due to lengthy discussion with MD and talking with wife on phone.   Pt requesting to go outside later this morning once weather is warmer - upcoming OT notified of pt's request. Ace wrapped bilateral LEs for edema management. Pt performed all transfers with rollator and supervision throughout session. Pt participated in (see below)  6 Min Walk Test:  Instructed patient to ambulate as quickly and as safely as possible for 6 minutes using LRAD. Patient was allowed to take standing rest breaks without stopping the test, but if the patient required a sitting rest break the clock would be stopped and the test would be over.  Results: 517 feet (157.8 meters, Avg speed .23m/s) using a rollator with supervision. Results indicate that the patient has reduced endurance with ambulation compared to age matched norms.  Age Matched Norms: 61-69 yo M: 66 F: 47, 70-79 yo M: 6 F: 471, 85-89 yo M: 417 F: 392 MDC: 58.21 meters (190.98 feet) or  50 meters (ANPTA Core Set of Outcome Measures for Adults with Neurologic Conditions, 2018)  Pt then ambulated 660ft with rollator and supervision back to room with 1 standing rest/water break = total distance of 1,177ft. Removed sock and noted new blister on lateral aspect of L great toe - RN notified. Concluded session with pt sitting in [facility] with all needs within reach.   Therapy Documentation Precautions:  Precautions Precautions: Fall Precaution/Restrictions Comments: chronic bilateral knee pain Required Braces or Orthoses: Other Brace Other Brace: PRAFO RLE when in bed Restrictions Weight Bearing Restrictions Per Provider Order: No  Therapy/Group: Individual Therapy Nicolas Barren Zaunegger Nena Bank PT, DPT 07/03/2023, 7:17 AM

## 2023-07-03 NOTE — Progress Notes (Signed)
 PROGRESS NOTE   Subjective/Complaints:  Pt reports other 2 blisters popped but has new one on back of L calf-  Not painful or even uncomfortable right now.  B/L LE edema- noted Dribbling stool- not on bowel meds- likely colchicine - occ blowout, but mainly dribbling stool.   Peeing well.    ROS: +creakiness in his joints- improved   Pt denies SOB, abd pain, CP, N/V/C/D, and vision changes (+) LE edema worse Objective:   No results found. Recent Labs    07/01/23 0504  WBC 6.6  HGB 12.0*  HCT 36.3*  PLT 398   Recent Labs    07/01/23 0504  NA 139  K 4.1  CL 105  CO2 22  GLUCOSE 107*  BUN 23  CREATININE 1.08  CALCIUM  8.5*     Intake/Output Summary (Last 24 hours) at 07/03/2023 1024 Last data filed at 07/03/2023 0826 Gross per 24 hour  Intake 408 ml  Output --  Net 408 ml        Physical Exam: Vital Signs Blood pressure (!) 142/90, pulse 73, temperature 98.5 F (36.9 C), temperature source Oral, resp. rate 18, height 6' (1.829 m), weight 94.1 kg, SpO2 100%.  Physical Exam   General: awake, alert, appropriate, sitting up on EOB on phone initially, then therapy in room;  NAD HENT: conjugate gaze; oropharynx moist- R eye wanders- is chronic CV: regular rate and rhythm; no JVD Pulmonary: CTA B/L; no W/R/R- good air movement GI: soft, NT, ND, (+)BS Psychiatric: appropriate Neurological: Ox3 Extremities:-B/L LE edema 2+ at feet - less so in R calf due to ACE wraps- Skin: L calf blister unpopped and R achilles back of distal calf- wound- ~ 2-2.5 inches- more superficial based on pics- has good granulation and scant serous drainage.  Musculoskeletal:        General: Swelling present.     Cervical back: Normal range of motion.     Right lower leg: Edema present. Improved    Left lower leg: Edema present. Improved    Comments: Chronic changes to both knees with mild effusions bilaterally.   Skin:  Blisters on left foot    General: Skin is warm.     Comments: Right lower extremity wound measures about 1.5x 4cm in diameter and is generally pink and dry. 4x4 gauze, kerlix, ACE wrap in place with PRAFO. Some redness along right lateral leg which appears mostly due to external rotation of leg/contact with bed. Old knee incision from ACL repair right.   Neurological:     Mental Status: He is alert.     Comments: Alert and oriented x 3. Normal insight and awareness. Intact Memory. Normal language and speech. Cranial nerve exam unremarkable except for dysconjugate gaze. MMT: BUE 4 to 4+/5. RLE 2-3/5 HF, KE and 4-/5 ADF/PF. LLE 3/5 HF, KE and 4+/5 ADF/PF. Sensory exam normal for light touch and pain in all 4 limbs. No limb ataxia or cerebellar signs. No abnormal tone appreciated.  Stable 4/18    Psychiatric:        Mood and Affect: Mood normal.        Behavior: Behavior normal.   Assessment/Plan: 1. Functional deficits which  require 3+ hours per day of interdisciplinary therapy in a comprehensive inpatient rehab setting. Physiatrist is providing close team supervision and 24 hour management of active medical problems listed below. Physiatrist and rehab team continue to assess barriers to discharge/monitor patient progress toward functional and medical goals  Care Tool:  Bathing    Body parts bathed by patient: Right arm, Left arm, Chest, Abdomen, Front perineal area, Right upper leg, Left upper leg, Right lower leg, Left lower leg, Face, Buttocks   Body parts bathed by helper: Buttocks     Bathing assist Assist Level: Supervision/Verbal cueing     Upper Body Dressing/Undressing Upper body dressing   What is the patient wearing?: Pull over shirt    Upper body assist Assist Level: Set up assist    Lower Body Dressing/Undressing Lower body dressing      What is the patient wearing?: Pants     Lower body assist Assist for lower body dressing: Supervision/Verbal cueing      Toileting Toileting    Toileting assist Assist for toileting: Moderate Assistance - Patient 50 - 74%     Transfers Chair/bed transfer  Transfers assist     Chair/bed transfer assist level: Supervision/Verbal cueing     Locomotion Ambulation   Ambulation assist      Assist level: Supervision/Verbal cueing Assistive device: Rollator Max distance: 693ft   Walk 10 feet activity   Assist     Assist level: Supervision/Verbal cueing Assistive device: Rollator   Walk 50 feet activity   Assist Walk 50 feet with 2 turns activity did not occur: Safety/medical concerns  Assist level: Supervision/Verbal cueing Assistive device: Rollator    Walk 150 feet activity   Assist Walk 150 feet activity did not occur: Safety/medical concerns  Assist level: Supervision/Verbal cueing Assistive device: Rollator    Walk 10 feet on uneven surface  activity   Assist Walk 10 feet on uneven surfaces activity did not occur: Safety/medical concerns   Assist level: Supervision/Verbal cueing Assistive device: Rollator   Wheelchair     Assist Is the patient using a wheelchair?: Yes Type of Wheelchair: Manual    Wheelchair assist level: Supervision/Verbal cueing Max wheelchair distance: 150 ft    Wheelchair 50 feet with 2 turns activity    Assist        Assist Level: Supervision/Verbal cueing   Wheelchair 150 feet activity     Assist      Assist Level: Supervision/Verbal cueing   Blood pressure (!) 142/90, pulse 73, temperature 98.5 F (36.9 C), temperature source Oral, resp. rate 18, height 6' (1.829 m), weight 94.1 kg, SpO2 100%.  Medical Problem List and Plan: 1. 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  Antithrombotics: -DVT/anticoagulation:  Mechanical: Antiembolism stockings, thigh (TED hose) Bilateral lower extremities             -antiplatelet therapy: continue Plavix  75 mg daily  3. Knee OA: continue Voltaren  gel 4 times daily oxycodone  as needed, tramadol  as needed, knee sleeves ordered  4/19- no pain of any kind per pt- con't regimen 4. Mood/Behavior/Sleep: Provide emotional support             -  antipsychotic agents: N/A 5. Neuropsych/cognition: This patient is capable of making decisions on his own behalf. 6. Skin/Wound Care:   -4 x 4 gauze dampened with Vashe to the right Achilles ulcer with kerlix and Ace wrap from the metatarsal heads up to the tubercle.   -PRAFO right foot -I reapplied the dressing today 4/19- Wound looks great- granulating well- with scant serous drainage.  7. Fluids/Electrolytes/Nutrition: Routine INO's with follow-up chemistries 8.  Left knee pain.  Lower extremity Dopplers negative.  X-ray showed chronic arthritis.  Trial of colchicine .  Uric acid normal limits 9.  Hyponatremia/SIADH.  Fluid restriction.    4/14 stable Na 136  4/19- Latest Na 139 10.  ID/bacteremia.  Was on combination of IV penicillin  and Zyvox  stopped 06/21/2023 changed to p.o. amoxicillin  with stop date 07/04/2023 11.  Atrial fibrillation.  Rate controlled with oral beta-blocker/Toprol -XL 25 mg every 12 hours.Librexa AF trial drug (comparing Eliquis  versus milvexian-factor XI inhibitor) 12.  New diagnosis of diabetes mellitus-2.  A1c 6.4.  Currently on SSI.  Plan metformin  on discharge.             -reasonable control at present  CBG (last 3)  No results for input(s): "GLUCAP" in the last 72 hours.  4/19- not checking CBGs since 4/13   13.  Hypertension.  Continue metoprolol .  HCTZ currently on hold.    07/03/2023    6:53 AM 07/03/2023    3:58 AM 07/02/2023    8:00 PM  Vitals with BMI  Weight 207 lbs 7 oz    BMI  28.13    Systolic  142 140  Diastolic  90 67  Pulse  73 83  Overall stable/controlled  4/19- BP slightly elevated this AM- will monitor- if need be, will restart hydrochlorothiazide   14.  History of prostate cancer. continue Pro scar 5 mg daily 15.  Hyperlipidemia.  Continue Zetia /Crestor   16.  Atelectasis: incentive spirometer ordered  17.  Hiccups.  Appear to have resolved, d/c baclofen   18. Overweight: provide dietary education  19. Vitamin D  insufficiency: start D3 daily, continue  20. Transaminitis: d/c tylenol   21. Bowel and bladder incontinence  -d/c bladder scans since not retaining  4/19- Having loose dribbling stool- likely due to colchicine - don't want to stop him up with Imodium- will monitor 22. Lower extremity edema: lasix  20mg  ordered x1 on 4/16, 40 ordered on 4/17, 20mg  lasix  ordered 4/18  4/19- have asked therapy to wrap LE's in ACE wraps from forefoot to just below knee B/L- until this evening. Will do daily for LE edema.   23. Nocturia: d/c flomax  given blisters on left foot in case these are side effect of the medication  24. HTN: resolved, 20mg  lasix  ordered 4/18  26. Loose stool: avoid laxatives, last BM 4/17  27. Blisters on left foot: WOC consulted, d/c flomax   4/19- Has another blister on back of L calf- other blisters have popped -this new one has not- not painful- will monitor- also edema can cause the blisters sometimes as well.    I spent a total of 47   minutes on total care today- >50% coordination of care- due to  Was in room 30 minutes managing pt's edema, educating pt and OT on wrapping; and d/w pt about stool; BP; fluid status and blisters-    LOS: 8 days A FACE TO FACE EVALUATION WAS PERFORMED  Samir Ishaq 07/03/2023, 10:24 AM

## 2023-07-03 NOTE — Progress Notes (Signed)
 Reinforced education on elevated bilateral extremities to reduce swelling. MD is aware.-orders in place. Patient verbalized understanding.   Randeen Busman, LPN

## 2023-07-04 NOTE — Progress Notes (Signed)
 Occupational Therapy Session Note  Patient Details  Name: Logan French MRN: [id number] Date of Birth: 1934/07/06  Today's Date: 07/04/2023 OT Individual Time: 1345-1430 OT Individual Time Calculation (min): 45 min    Short Term Goals: Week 1:  OT Short Term Goal 1 (Week 1): Pt will complete sit > stand in prep for ADL with CGA using LRAD OT Short Term Goal 2 (Week 1): Pt will engage in ADL task at sink in stance for 3 mins with min A to promote standing tolerance OT Short Term Goal 3 (Week 1): Pt will utilize AE PRN to thread LB clothing with supervision  Skilled Therapeutic Interventions/Progress Updates:      Therapy Documentation Precautions:  Precautions Precautions: Fall Precaution/Restrictions Comments: chronic bilateral knee pain Required Braces or Orthoses: Other Brace Other Brace: PRAFO RLE when in bed Restrictions Weight Bearing Restrictions Per Provider Order: No General: "Hello there!" Pt seated in W/C upon OT arrival, agreeable to OT. Pt requesting to hold session outside for increased mood and QoL.   Pain: no pain reported   Other Treatments:  Pt reporting wanting to work on functional mobility and endurance. OT providing skilled intervention with functional mobility. Pt able to ambulate with RW around outdoor area on uneven terrain, around obstacles and complete multiple sit to stand transfers after PRN breaks at SBA. Pt with no LOB/SOB, able to multi-task with conversing and ambulating simultaneously with distractions of others provided in order to increase awareness for D/C preparation. Pt completing greater than household distances with functional mobility this date.   Pt seated in W/C at end of session with W/C alarm donned, call light within reach and 4Ps assessed.     Therapy/Group: Individual Therapy  Nila Barth, OTD, OTR/L 07/04/2023, 3:55 PM

## 2023-07-04 NOTE — Progress Notes (Signed)
 PROGRESS NOTE   Subjective/Complaints:  Pt reports sig better swelling below knees- great day- walked 1126 ft total.  Went outside.  Still eating, but ate 90% of tray.  Still has unpopped blister back of L calf L toe base has opened up from popped blister.     ROS: +creakiness in his joints- improved    General: awake, alert, appropriate, NAD HENT: conjugate gaze; oropharynx moist CV: regular rate; no JVD Pulmonary: CTA B/L; no W/R/R- good air movement GI: soft, NT, ND, (+)BS Psychiatric: appropriate Neurological: Ox3  (+) LE edema worse Objective:   No results found. No results for input(s): "WBC", "HGB", "HCT", "PLT" in the last 72 hours.  No results for input(s): "NA", "K", "CL", "CO2", "GLUCOSE", "BUN", "CREATININE", "CALCIUM " in the last 72 hours.    Intake/Output Summary (Last 24 hours) at 07/04/2023 1622 Last data filed at 07/04/2023 1300 Gross per 24 hour  Intake 948 ml  Output --  Net 948 ml        Physical Exam: Vital Signs Blood pressure 138/85, pulse 73, temperature 98.1 F (36.7 C), temperature source Oral, resp. rate 19, height 6' (1.829 m), weight 94.1 kg, SpO2 99%.  Physical Exam    General: awake, alert, appropriate, sitting EOB on phone; NAD HENT: conjugate gaze; oropharynx moist CV: regular rate and rhythm; no JVD Pulmonary: CTA B/L; no W/R/R- good air movement GI: soft, NT, ND, (+)BS Psychiatric: appropriate- very interactive Neurological: Ox3  Extremities:-B/L LE edema 1+ at feet -much better in feet and  calves, but knees, above ACE wrap (that was on yesterday) 2+ edema at knees/just above knees B/L Skin: L calf blister unpopped still  and L base of great toe and top of L foot skin mildly sloughing from popped blisters and R achilles back of distal calf- wound- ~ 2-2.5 inches- more superficial based on pics- has good granulation and scant serous drainage.  Musculoskeletal:         General: Swelling present.     Cervical back: Normal range of motion.     Right lower leg: Edema present. Improved    Left lower leg: Edema present. Improved    Comments: Chronic changes to both knees with mild effusions bilaterally.   Skin: Blisters on left foot    General: Skin is warm.     Comments: Right lower extremity wound measures about 1.5x 4cm in diameter and is generally pink and dry. 4x4 gauze, kerlix, ACE wrap in place with PRAFO. Some redness along right lateral leg which appears mostly due to external rotation of leg/contact with bed. Old knee incision from ACL repair right.   Neurological:     Mental Status: He is alert.     Comments: Alert and oriented x 3. Normal insight and awareness. Intact Memory. Normal language and speech. Cranial nerve exam unremarkable except for dysconjugate gaze. MMT: BUE 4 to 4+/5. RLE 2-3/5 HF, KE and 4-/5 ADF/PF. LLE 3/5 HF, KE and 4+/5 ADF/PF. Sensory exam normal for light touch and pain in all 4 limbs. No limb ataxia or cerebellar signs. No abnormal tone appreciated.  Stable 4/18    Psychiatric:        Mood  and Affect: Mood normal.        Behavior: Behavior normal.   Assessment/Plan: 1. Functional deficits which require 3+ hours per day of interdisciplinary therapy in a comprehensive inpatient rehab setting. Physiatrist is providing close team supervision and 24 hour management of active medical problems listed below. Physiatrist and rehab team continue to assess barriers to discharge/monitor patient progress toward functional and medical goals  Care Tool:  Bathing    Body parts bathed by patient: Right arm, Left arm, Chest, Abdomen, Front perineal area, Right upper leg, Left upper leg, Right lower leg, Left lower leg, Face, Buttocks   Body parts bathed by helper: Buttocks     Bathing assist Assist Level: Supervision/Verbal cueing     Upper Body Dressing/Undressing Upper body dressing   What is the patient wearing?: Pull over  shirt    Upper body assist Assist Level: Set up assist    Lower Body Dressing/Undressing Lower body dressing      What is the patient wearing?: Pants     Lower body assist Assist for lower body dressing: Supervision/Verbal cueing     Toileting Toileting    Toileting assist Assist for toileting: Moderate Assistance - Patient 50 - 74%     Transfers Chair/bed transfer  Transfers assist     Chair/bed transfer assist level: Supervision/Verbal cueing     Locomotion Ambulation   Ambulation assist      Assist level: Supervision/Verbal cueing Assistive device: Rollator Max distance: 684ft   Walk 10 feet activity   Assist     Assist level: Supervision/Verbal cueing Assistive device: Rollator   Walk 50 feet activity   Assist Walk 50 feet with 2 turns activity did not occur: Safety/medical concerns  Assist level: Supervision/Verbal cueing Assistive device: Rollator    Walk 150 feet activity   Assist Walk 150 feet activity did not occur: Safety/medical concerns  Assist level: Supervision/Verbal cueing Assistive device: Rollator    Walk 10 feet on uneven surface  activity   Assist Walk 10 feet on uneven surfaces activity did not occur: Safety/medical concerns   Assist level: Supervision/Verbal cueing Assistive device: Rollator   Wheelchair     Assist Is the patient using a wheelchair?: Yes Type of Wheelchair: Manual    Wheelchair assist level: Supervision/Verbal cueing Max wheelchair distance: 150 ft    Wheelchair 50 feet with 2 turns activity    Assist        Assist Level: Supervision/Verbal cueing   Wheelchair 150 feet activity     Assist      Assist Level: Supervision/Verbal cueing   Blood pressure 138/85, pulse 73, temperature 98.1 F (36.7 C), temperature source Oral, resp. rate 19, height 6' (1.829 m), weight 94.1 kg, SpO2 99%.  Medical Problem List and Plan: 1. 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  Antithrombotics: -DVT/anticoagulation:  Mechanical: Antiembolism stockings, thigh (TED hose) Bilateral lower extremities             -antiplatelet therapy: continue Plavix  75 mg daily  3. Knee OA: continue Voltaren  gel 4 times daily oxycodone  as needed, tramadol  as needed, knee sleeves ordered  4/19- 4/20no  pain of any kind per pt- con't regimen 4. Mood/Behavior/Sleep: Provide emotional support             -antipsychotic agents: N/A 5. Neuropsych/cognition: This patient is capable of making decisions on his own behalf. 6. Skin/Wound Care:   -4 x 4 gauze dampened with Vashe to the right Achilles ulcer with kerlix and Ace wrap from the metatarsal heads up to the tubercle.   -PRAFO right foot -I reapplied the dressing today 4/19- Wound looks great- granulating well- with scant serous drainage.  7. Fluids/Electrolytes/Nutrition: Routine INO's with follow-up chemistries 8.  Left knee pain.  Lower extremity Dopplers negative.  X-ray showed chronic arthritis.  Trial of colchicine .  Uric acid normal limits 9.  Hyponatremia/SIADH.  Fluid restriction.    4/14 stable Na 136  4/19- Latest Na 139 10.  ID/bacteremia.  Was on combination of IV penicillin  and Zyvox  stopped 06/21/2023 changed to p.o. amoxicillin  with stop date 07/04/2023  4/20- last day today 11.  Atrial fibrillation.  Rate controlled with oral beta-blocker/Toprol -XL 25 mg every 12 hours.Librexa AF trial drug (comparing Eliquis  versus milvexian-factor XI inhibitor) 12.  New diagnosis of diabetes mellitus-2.  A1c 6.4.  Currently on SSI.  Plan metformin  on discharge.             -reasonable control at present  CBG (last 3)  No results for  input(s): "GLUCAP" in the last 72 hours.  4/19- not checking CBGs since 4/13   13.  Hypertension.  Continue metoprolol .  HCTZ currently on hold.    07/04/2023    5:19 AM 07/04/2023    3:30 AM 07/03/2023    8:33 PM  Vitals with BMI  Systolic 138 157 284  Diastolic 85 105 65  Pulse 73 78 40  Overall stable/controlled  4/19- BP slightly elevated this AM- will monitor- if need be, will restart hydrochlorothiazide    4/20- BP looking better this AM 14.  History of prostate cancer. continue Pro scar 5 mg daily 15.  Hyperlipidemia.  Continue Zetia /Crestor   16.  Atelectasis: incentive spirometer ordered  17.  Hiccups.  Appear to have resolved, d/c baclofen   18. Overweight: provide dietary education  19. Vitamin D  insufficiency: start D3 daily, continue  20. Transaminitis: d/c tylenol   21. Bowel and bladder incontinence  -d/c bladder scans since not retaining  4/19- Having loose dribbling stool- likely due to colchicine - don't want to stop him up with Imodium- will monitor  4/20- last BM documented as 4/18- but I think he's still dribbling-  22. Lower extremity edema: lasix  20mg  ordered x1 on 4/16, 40 ordered on 4/17, 20mg  lasix  ordered 4/18  4/19- have asked therapy to wrap LE's in ACE wraps from forefoot to just below knee B/L- until this evening. Will do daily for LE edema.   4/20- swelling much better- ordered ACE wraps- hard to order unless has therapy- might need reordering since order would lonely let me do once, not daily.  23. Nocturia: d/c flomax  given blisters on left foot in case these are side effect of the medication  24. HTN: resolved, 20mg  lasix  ordered 4/18  26. Loose stool: avoid laxatives, last BM 4/17  4/20- LBM 4/18 27. Blisters on left foot: WOC consulted, d/c flomax   4/19- Has another blister on back of L calf- other blisters have popped -this new one has not- not painful- will monitor- also edema can cause the blisters sometimes as well.   4/20- blisters on L  foot have caused slough of skin- ordered  xeroform with tegaderm over it for now- don't want tape on his skin.    I spent a total of 39   minutes on total care today- >50% coordination of care- due to  D/w nursing about wounds as well as LE edema.    LOS: 9 days A FACE TO FACE EVALUATION WAS PERFORMED  Logan French 07/04/2023, 4:22 PM

## 2023-07-05 MED ORDER — MEDIHONEY WOUND/BURN DRESSING EX PSTE
1.0000 | PASTE | Freq: Every day | CUTANEOUS | Status: DC
  Administered 2023-07-06 – 2023-07-07 (×2): 1 via TOPICAL
  Filled 2023-07-05: qty 44

## 2023-07-05 MED ORDER — METFORMIN HCL 500 MG PO TABS
500.0000 mg | ORAL_TABLET | Freq: Every day | ORAL | Status: DC
Start: 2023-07-06 — End: 2023-07-07
  Administered 2023-07-06 – 2023-07-07 (×2): 500 mg via ORAL
  Filled 2023-07-05 (×2): qty 1

## 2023-07-05 MED ORDER — FUROSEMIDE 20 MG PO TABS
20.0000 mg | ORAL_TABLET | Freq: Once | ORAL | Status: AC
  Administered 2023-07-05: 20 mg via ORAL
  Filled 2023-07-05: qty 1

## 2023-07-05 MED ORDER — MEDIHONEY WOUND/BURN DRESSING EX PSTE
1.0000 | PASTE | Freq: Every day | CUTANEOUS | Status: DC
  Administered 2023-07-05: 1 via TOPICAL
  Filled 2023-07-05: qty 44

## 2023-07-05 MED ORDER — ACETAMINOPHEN 325 MG PO TABS: 650.0000 mg | ORAL_TABLET | Freq: Four times a day (QID) | ORAL | Status: DC | PRN

## 2023-07-05 NOTE — Progress Notes (Signed)
 Physical Therapy Session Note  Patient Details  Name: Logan French MRN: [id number] Date of Birth: May 05, 1934  Today's Date: 07/05/2023 PT Individual Time: 0800-0914 PT Individual Time Calculation (min): 74 min   Short Term Goals: Week 1:  PT Short Term Goal 1 (Week 1): pt will perform STS with min a consistently PT Short Term Goal 2 (Week 1): Pt will ambulate x 30 ft with assist PT Short Term Goal 3 (Week 1): Pt will initiate stair training  Skilled Therapeutic Interventions/Progress Updates:      Pt sitting EOB finishing up his breakfast. Agreeable to PT tx and has no c/o pain. Pt specifically requests to work on car transfers to prepare for upcoming DC. He's pretty hyperverbile and speaks highly of his family and grandchildren - needs gently redirection for therapeutic tasks throughout the session.  Donned shorts at EOB with setupA - able to thread BLE without assist with ++ time. Sit<>stand to rollator with supervision assist and ambulates to ortho gym ~114ft with supervision and rollator. Min cues for forward gaze and upright posture. Completed car transfer at supervision level - pt requesting to repeat this a few times to become "proficient" - able to enter/exit vehicle several times with supervision cues.   Practiced navigating a ramp with supervision and rollator with min cues for safety awareness. Able to ambulate to main gym without a rest break with supervision and rollator and then completed stair training using 6" steps. He navigated up/down x4 steps with supervision using 2 hand rails while forward facing and step-to pattern. He then navigated up/down x4 steps with supervision using 1 hand rail on R with forward facing with a step-to pattern as well. No knee buckling or LOB throughout stairs or ambulation during session. MD present for morning rounding - discussing wound healing and swelling in BLE.   Pt returned to his room and he requested to stay sitting upright in w/c. Pt  aware of upcoming therapy schedule.   Therapy Documentation Precautions:  Precautions Precautions: Fall Precaution/Restrictions Comments: chronic bilateral knee pain Required Braces or Orthoses: Other Brace Other Brace: PRAFO RLE when in bed Restrictions Weight Bearing Restrictions Per Provider Order: No General:      Therapy/Group: Individual Therapy  Pheobe Brass 07/05/2023, 7:52 AM

## 2023-07-05 NOTE — Progress Notes (Signed)
 Patient ID: Logan French, male   DOB: 1934/05/17, 89 y.o.   MRN: [id number]  Pt requested a handicap placard per assigned RN.  SW provided pt with form.  Norval Been, MSW, LCSW Office: [phone] Cell: [phone] Fax: [phone]

## 2023-07-05 NOTE — Progress Notes (Addendum)
 Occupational Therapy Session Note  Patient Details  Name: Logan French MRN: [id number] Date of Birth: September 29, 1934  Today's Date: 07/05/2023 OT Individual Time: 1300-1415 and 1445 - 1530 OT Individual Time Calculation (min): 75 min and 45 min   Short Term Goals: Week 1:  OT Short Term Goal 1 (Week 1): Pt will complete sit > stand in prep for ADL with CGA using LRAD OT Short Term Goal 2 (Week 1): Pt will engage in ADL task at sink in stance for 3 mins with min A to promote standing tolerance OT Short Term Goal 3 (Week 1): Pt will utilize AE PRN to thread LB clothing with supervision  Skilled Therapeutic Interventions/Progress Updates:    Visit 1: no c/o pain   Pt received in w/c and ready to toilet. Pt used RW to ambulate to bathroom. He said he needed to remove all LB clothing as he tends to dribble when standing at the toilet.  Pt dropped shorts and stepped out of briefs and shorts in standing using RW for support. After toileting, encouraged pt to sit on chair in bathroom when redonning his clothing.  Provided pt with fresh briefs and he donned shorts without difficulty. Pt opted to change shirts. Ambulated to sink and in standing doffed and donned his shirt and sweatshirt with no LOB.   Pt eager to go outside.  Using his wc to sit in, pt helped this therapist by pushing his 4WW as therapist pushed pt in his chair to first floor courtyard. Once in courtyard, pt ambulated with 4WW around courtyard.  Sat on bench to engage in HEP using red theraband to focus on shoulder strength with arm reaches and pulls. Pt engaged in exercises well with cues to keep forearm in neutral versus pronated.   Pt then ambulated from edge of courtyard to inside  of hospital with supervision with [phone].  Pt then sat in wc to return to room. Pt opted to sit in wc with feet propped up on bed to rest until next session. Call light in reach.    Visit 2:  no c/o pain  Pt continuing to sit with his feet on the EOB and  stated this was comfortable. He was agreeable to trying a few strategies to work on unwrapping and wrapping his LEs with ace wrap. Pt moved to sit on EOB and able to doff socks. First trialed having foot on  bed but pt unable to bend knee enough.  Made a "foot stool" using edge of trash can.  Pt was able to put foot on this and able to unwrap both legs. With cues for technique he wrapped his L leg well but needed min A with R for reaching adequately.  Practiced this 2-3 x.   Sitting EOB worked on B leg AROM exercises to help with circulation.  Pt opted to rest in bed. Pt in bed with all needs met.   Alarm set and call light in reach.   Therapy Documentation Precautions:  Precautions Precautions: Fall Precaution/Restrictions Comments: chronic bilateral knee pain Required Braces or Orthoses: Other Brace Other Brace: PRAFO RLE when in bed Restrictions Weight Bearing Restrictions Per Provider Order: No General:   Vital Signs:   Pain:   ADL: ADL Eating: Set up Where Assessed-Eating: Wheelchair Grooming: Supervision/safety Where Assessed-Grooming: Sitting at sink Upper Body Bathing: Supervision/safety Where Assessed-Upper Body Bathing: Edge of bed Lower Body Bathing: Minimal assistance Where Assessed-Lower Body Bathing: Edge of bed Upper Body Dressing: Supervision/safety Where Assessed-Upper  Body Dressing: Edge of bed Lower Body Dressing: Maximal assistance Where Assessed-Lower Body Dressing: Edge of bed Toileting: Maximal assistance Where Assessed-Toileting: Bedside Commode Toilet Transfer: Minimal assistance Toilet Transfer Method: Stand pivot Toilet Transfer Equipment: Bedside commode, Other (comment) (RW) Tub/Shower Transfer: Unable to assess Tub/Shower Transfer Method: Unable to assess [employer] Transfer: Unable to assess [employer] Method: Unable to assess   Therapy/Group: Individual Therapy  Ordell Prichett 07/05/2023, 8:49 AM

## 2023-07-05 NOTE — Progress Notes (Signed)
 Nurse removed ace wraps at hs per order. Kerlex present underneath covering wounds. Pt stated, "they take those off too." Pt stated no they take off everything, it needs to air out. Nurse educated patient that they are holding dressing in place, they they are loose and not compressing legs. Pt stated, "well I still want them off." Nurse removed dressingg, blisters OTA. Bilateral LE edema +2, weeping. Pt also want to ambulate barefoot when going to the bathroom at hs, nurse educated patient on safety protocols and needs for nonskid footwear especially well weeping/wet feet. Pt stated, " If it will make you happy." Allowed nurse to apply socks when going to bathroom.

## 2023-07-05 NOTE — Progress Notes (Signed)
 Occupational Therapy Weekly Progress Note  Patient Details  Name: Jewell Ryans MRN: [id number] Date of Birth: 1934/05/11  Beginning of progress report period: June 26, 2023 End of progress report period: July 04, 2023   Patient has met 3 of 3 short term goals.  Pt is making great progress towards LTGs. Overall he is at the supervision to mod I level for all BADLs, including functional ambulation for greater than household distance using rollator. Pt continues to demonstrate cardiovascular endurance and BLE strength deficits resulting in difficulty completing BADL tasks without increased physical assist. Pt will benefit from continued skilled OT services to focus on mentioned deficits. Family ed completed as of date and will be needed in prep for DC.   Patient continues to demonstrate the following deficits: muscle weakness, decreased cardiorespiratoy endurance, decreased coordination, and decreased balance strategies and therefore will continue to benefit from skilled OT intervention to enhance overall performance with BADL, iADL, and Reduce care partner burden.  Patient progressing toward long term goals..  Continue plan of care.  OT Short Term Goals Week 1:  OT Short Term Goal 1 (Week 1): Pt will complete sit > stand in prep for ADL with CGA using LRAD OT Short Term Goal 1 - Progress (Week 1): Met OT Short Term Goal 2 (Week 1): Pt will engage in ADL task at sink in stance for 3 mins with min A to promote standing tolerance OT Short Term Goal 2 - Progress (Week 1): Met OT Short Term Goal 3 (Week 1): Pt will utilize AE PRN to thread LB clothing with supervision OT Short Term Goal 3 - Progress (Week 1): Met Week 2:  OT Short Term Goal 1 (Week 2): STG = LTG due to [facility], MS, OTR/L  07/05/2023, 12:24 PM

## 2023-07-05 NOTE — Plan of Care (Signed)
  Problem: Consults Goal: RH GENERAL PATIENT EDUCATION Description: See Patient Education module for education specifics. Outcome: Progressing   Problem: RH BOWEL ELIMINATION Goal: RH STG MANAGE BOWEL WITH ASSISTANCE Description: STG Manage Bowel with mod I Assistance. Outcome: Progressing   Problem: RH BOWEL ELIMINATION Goal: RH STG MANAGE BOWEL W/MEDICATION W/ASSISTANCE Description: STG Manage Bowel with Medication with mod I Assistance. Outcome: Progressing   Problem: RH BLADDER ELIMINATION Goal: RH STG MANAGE BLADDER WITH ASSISTANCE Description: STG Manage Bladder With mdo I  Assistance Outcome: Progressing   Problem: RH BLADDER ELIMINATION Goal: RH STG MANAGE BLADDER WITH MEDICATION WITH ASSISTANCE Description: STG Manage Bladder With Medication With mod I Assistance. Outcome: Progressing   Problem: RH SKIN INTEGRITY Goal: RH STG SKIN FREE OF INFECTION/BREAKDOWN Description: Manage w min assist Outcome: Progressing

## 2023-07-05 NOTE — Progress Notes (Signed)
 Occupational Therapy Session Note  Patient Details  Name: Logan French MRN: [id number] Date of Birth: 05-21-1934  Today's Date: 07/05/2023 OT Individual Time: 0919-1000 OT Individual Time Calculation (min): 41 min    Short Term Goals: Week 1:  OT Short Term Goal 1 (Week 1): Pt will complete sit > stand in prep for ADL with CGA using LRAD OT Short Term Goal 2 (Week 1): Pt will engage in ADL task at sink in stance for 3 mins with min A to promote standing tolerance OT Short Term Goal 3 (Week 1): Pt will utilize AE PRN to thread LB clothing with supervision  Skilled Therapeutic Interventions/Progress Updates:  Skilled OT intervention completed with focus on cardiovascular endurance, ambulatory transfers, discussion on return to driving. Pt received seated in w/c, agreeable to session. No pain reported.  Pt declined self-care needs. Completed all sit > stands and ambulatory transfers with supervision using rollator. Ambulated about 75 ft > gym. Pt completed the following intervals on nustep using pace partner for sustained speed/RPM, to promote global/cardiovascular endurance and BLE ROM needed for independence with BADLs and functional mobility: -6 mins, level 3 -6 mins, level 3 Intermittent rest break needed for fatigue.  Pt with questions about his ability to stay home unattended while wife is at work. Educated pt on mod I level goals meaning pt does not need supervision, except for recommended supervision for showers/transfer. Pt also inquiring about return to driving. Ambulated > BITS. Pt participated in the following activities in standing without AD to address potential skills needed for operating a MV: -Motor speed dot test- 1.18 sec, no difficulty, mild delay in peripheral however results WNL and appropriate for driving however continued to reinforce his clearance is needed by MD.  Ambulated > room. New rollator delivered. OT labeled new device per request and removed old one with  suggested raising of handles for improved functional positioning with ambulation. Pt returned to bed per nursing request at mod I level. Pt remained upright in bed, with bed alarm on/activated, and with all needs in reach at end of session.   Therapy Documentation Precautions:  Precautions Precautions: Fall Precaution/Restrictions Comments: chronic bilateral knee pain Required Braces or Orthoses: Other Brace Other Brace: PRAFO RLE when in bed Restrictions Weight Bearing Restrictions Per Provider Order: No    Therapy/Group: Individual Therapy  Ruthanna Covert, MS, OTR/L  07/05/2023, 10:24 AM

## 2023-07-05 NOTE — Plan of Care (Signed)
  Problem: Consults Goal: RH GENERAL PATIENT EDUCATION Description: See Patient Education module for education specifics. Outcome: Progressing   Problem: RH BOWEL ELIMINATION Goal: RH STG MANAGE BOWEL WITH ASSISTANCE Description: STG Manage Bowel with mod I Assistance. Outcome: Progressing Goal: RH STG MANAGE BOWEL W/MEDICATION W/ASSISTANCE Description: STG Manage Bowel with Medication with mod I Assistance. Outcome: Progressing   Problem: RH BLADDER ELIMINATION Goal: RH STG MANAGE BLADDER WITH ASSISTANCE Description: STG Manage Bladder With mdo I  Assistance Outcome: Progressing Goal: RH STG MANAGE BLADDER WITH MEDICATION WITH ASSISTANCE Description: STG Manage Bladder With Medication With mod I Assistance. Outcome: Progressing   Problem: RH SKIN INTEGRITY Goal: RH STG SKIN FREE OF INFECTION/BREAKDOWN Description: Manage w min assist Outcome: Progressing Goal: RH STG MAINTAIN SKIN INTEGRITY WITH ASSISTANCE Description: STG Maintain Skin Integrity With min Assistance. Outcome: Progressing Goal: RH STG ABLE TO PERFORM INCISION/WOUND CARE W/ASSISTANCE Description: STG Able To Perform Incision/Wound Care With min  Assistance. Outcome: Progressing   Problem: RH KNOWLEDGE DEFICIT GENERAL Goal: RH STG INCREASE KNOWLEDGE OF SELF CARE AFTER HOSPITALIZATION Description: Manage care using educational resources for medications, skin care and dietary modifications independently Outcome: Progressing

## 2023-07-05 NOTE — Progress Notes (Signed)
 Occupational Therapy Discharge Summary  Patient Details  Name: Logan French MRN: [id number] Date of Birth: January 27, 1935  Date of Discharge from OT service:July 06, 2023   Patient has met 9 of 9 long term goals due to improved activity tolerance, improved balance, ability to compensate for deficits, improved awareness, and improved coordination.  Patient to discharge at overall Modified Independent level.  Patient's care partner is independent to provide the necessary physical assistance at discharge.    All goals met.  Recommendation:  Patient will benefit from ongoing skilled OT services in outpatient setting to continue to advance functional skills in the area of BADL, iADL, Vocation, and Reduce care partner burden.  Equipment: Shower chair with arm rests and back  Reasons for discharge: treatment goals met  Patient/family agrees with progress made and goals achieved: Yes  OT Discharge Precautions/Restrictions  Precautions Precautions: Fall Precaution/Restrictions Comments: chronic bilateral knee pain, RLE achilles wound, LLE blisters Required Braces or Orthoses: Other Brace Other Brace: PRAFO RLE when in bed Restrictions Weight Bearing Restrictions Per Provider Order: No ADL ADL Equipment Provided: Reacher Eating: Independent Where Assessed-Eating: Wheelchair Grooming: Modified independent Where Assessed-Grooming: Sitting at sink Upper Body Bathing: Setup Where Assessed-Upper Body Bathing: Shower Lower Body Bathing: Setup Where Assessed-Lower Body Bathing: Shower Upper Body Dressing: Modified independent (Device) Where Assessed-Upper Body Dressing: Sitting at sink Lower Body Dressing: Modified independent Where Assessed-Lower Body Dressing: Sitting at sink, Standing at sink Toileting: Modified independent Where Assessed-Toileting: Neurosurgeon Method: Proofreader: Raised toilet seat,  Other (comment) (rollator) Tub/Shower Transfer: Not assessed Tub/Shower Transfer Method: Unable to assess Film/video editor: Close supervision Film/video editor Method: Designer, industrial/product: Information systems manager with back, Other (comment) (rollator) Vision Baseline Vision/History: 1 Wears glasses;4 Cataracts Patient Visual Report: No change from baseline Vision Assessment?: No apparent visual deficits Perception  Perception: Within Functional Limits Praxis Praxis: WFL Cognition Cognition Overall Cognitive Status: Within Functional Limits for tasks assessed Arousal/Alertness: Awake/alert Orientation Level: Person;Place;Situation Person: Oriented Place: Oriented Situation: Oriented Memory: Appears intact Awareness: Appears intact Problem Solving: Appears intact Safety/Judgment: Appears intact Brief Interview for Mental Status (BIMS) Repetition of Three Words (First Attempt): 3 Temporal Orientation: Year: Correct Temporal Orientation: Month: Accurate within 5 days Temporal Orientation: Day: Correct Recall: "Sock": Yes, no cue required Recall: "Blue": Yes, no cue required Recall: "Bed": Yes, no cue required BIMS Summary Score: 15 Sensation Sensation Light Touch: Appears Intact Hot/Cold: Not tested Proprioception: Appears Intact Stereognosis: Not tested Coordination Gross Motor Movements are Fluid and Coordinated: Yes Fine Motor Movements are Fluid and Coordinated: Yes Finger Nose Finger Test: [facility] bilaterally Heel Shin Test: [facility] bilaterally Motor  Motor Motor: Within Functional Limits Mobility  Bed Mobility Bed Mobility: Rolling Right;Rolling Left;Sit to Supine;Supine to Sit Rolling Right: Independent Rolling Left: Independent Supine to Sit: Independent Sit to Supine: Independent Transfers Sit to Stand: Independent with assistive device Stand to Sit: Independent with assistive device  Trunk/Postural Assessment  Cervical Assessment Cervical  Assessment: Within Functional Limits Thoracic Assessment Thoracic Assessment: Within Functional Limits Lumbar Assessment Lumbar Assessment: Within Functional Limits Postural Control Postural Control: Within Functional Limits  Balance Balance Balance Assessed: Yes Static Sitting Balance Static Sitting - Balance Support: Feet supported;No upper extremity supported Static Sitting - Level of Assistance: 7: Independent Dynamic Sitting Balance Dynamic Sitting - Balance Support: Feet supported;No upper extremity supported Dynamic Sitting - Level of Assistance: 6: Modified independent (Device/Increase time) Static Standing Balance Static Standing - Balance Support: Bilateral  upper extremity supported;During functional activity (rollator) Static Standing - Level of Assistance: 6: Modified independent (Device/Increase time) Dynamic Standing Balance Dynamic Standing - Balance Support: Bilateral upper extremity supported;During functional activity (rollator) Dynamic Standing - Level of Assistance: 6: Modified independent (Device/Increase time) Dynamic Standing - Comments: with transfers and gait Extremity/Trunk Assessment RUE Assessment RUE Assessment: Within Functional Limits LUE Assessment LUE Assessment: Within Functional Limits   Tayley Mudrick E Manvir Prabhu, MS, OTR/L  07/05/2023, 4:01 PM

## 2023-07-05 NOTE — Progress Notes (Signed)
 PROGRESS NOTE   Subjective/Complaints:  No new complaints this morning LE edema improved but still not at baseline, additional Lasix  20mg  ordered Blisters on left foot popped     ROS: +creakiness in his joints- improved, +popped blisters on left foot    Objective:   No results found. No results for input(s): "WBC", "HGB", "HCT", "PLT" in the last 72 hours.  No results for input(s): "NA", "K", "CL", "CO2", "GLUCOSE", "BUN", "CREATININE", "CALCIUM " in the last 72 hours.    Intake/Output Summary (Last 24 hours) at 07/05/2023 1034 Last data filed at 07/04/2023 2010 Gross per 24 hour  Intake 596 ml  Output --  Net 596 ml        Physical Exam: Vital Signs Blood pressure 136/75, pulse (!) 59, temperature 97.9 F (36.6 C), temperature source Oral, resp. rate 18, height 6' (1.829 m), weight 96.2 kg, SpO2 96%.  Physical Exam    General: awake, alert, appropriate, sitting EOB on phone; NAD HENT: conjugate gaze; oropharynx moist CV: Bradycardia Pulmonary: CTA B/L; no W/R/R- good air movement GI: soft, NT, ND, (+)BS Psychiatric: appropriate- very interactive Neurological: Ox3  Extremities:-B/L LE edema 1+ at feet -much better in feet and  calves, but knees, above ACE wrap (that was on yesterday) 2+ edema at knees/just above knees B/L Skin: L calf blister unpopped still  and L base of great toe and top of L foot skin mildly sloughing from popped blisters and R achilles back of distal calf- wound- ~ 2-2.5 inches- more superficial based on pics- has good granulation and scant serous drainage.  Musculoskeletal:        General: Swelling present.     Cervical back: Normal range of motion.     Right lower leg: Edema present. Improved    Left lower leg: Edema present. Improved    Comments: Chronic changes to both knees with mild effusions bilaterally.   Skin: Blisters on left foot    General: Skin is warm.     Comments:  Right lower extremity wound measures about 1.5x 4cm in diameter and is generally pink and dry. 4x4 gauze, kerlix, ACE wrap in place with PRAFO. Some redness along right lateral leg which appears mostly due to external rotation of leg/contact with bed. Old knee incision from ACL repair right.   Neurological:     Mental Status: He is alert.     Comments: Alert and oriented x 3. Normal insight and awareness. Intact Memory. Normal language and speech. Cranial nerve exam unremarkable except for dysconjugate gaze. MMT: BUE 4 to 4+/5. RLE 2-3/5 HF, KE and 4-/5 ADF/PF. LLE 3/5 HF, KE and 4+/5 ADF/PF. Sensory exam normal for light touch and pain in all 4 limbs. No limb ataxia or cerebellar signs. No abnormal tone appreciated.  Stable 4/18    Psychiatric:        Mood and Affect: Mood normal.        Behavior: Behavior normal.   Assessment/Plan: 1. Functional deficits which require 3+ hours per day of interdisciplinary therapy in a comprehensive inpatient rehab setting. Physiatrist is providing close team supervision and 24 hour management of active medical problems listed below. Physiatrist and rehab team continue  to assess barriers to discharge/monitor patient progress toward functional and medical goals  Care Tool:  Bathing    Body parts bathed by patient: Right arm, Left arm, Chest, Abdomen, Front perineal area, Right upper leg, Left upper leg, Right lower leg, Left lower leg, Face, Buttocks   Body parts bathed by helper: Buttocks     Bathing assist Assist Level: Supervision/Verbal cueing     Upper Body Dressing/Undressing Upper body dressing   What is the patient wearing?: Pull over shirt    Upper body assist Assist Level: Set up assist    Lower Body Dressing/Undressing Lower body dressing      What is the patient wearing?: Pants     Lower body assist Assist for lower body dressing: Supervision/Verbal cueing     Toileting Toileting    Toileting assist Assist for toileting:  Moderate Assistance - Patient 50 - 74%     Transfers Chair/bed transfer  Transfers assist     Chair/bed transfer assist level: Supervision/Verbal cueing     Locomotion Ambulation   Ambulation assist      Assist level: Supervision/Verbal cueing Assistive device: Rollator Max distance: 628ft   Walk 10 feet activity   Assist     Assist level: Supervision/Verbal cueing Assistive device: Rollator   Walk 50 feet activity   Assist Walk 50 feet with 2 turns activity did not occur: Safety/medical concerns  Assist level: Supervision/Verbal cueing Assistive device: Rollator    Walk 150 feet activity   Assist Walk 150 feet activity did not occur: Safety/medical concerns  Assist level: Supervision/Verbal cueing Assistive device: Rollator    Walk 10 feet on uneven surface  activity   Assist Walk 10 feet on uneven surfaces activity did not occur: Safety/medical concerns   Assist level: Supervision/Verbal cueing Assistive device: Rollator   Wheelchair     Assist Is the patient using a wheelchair?: Yes Type of Wheelchair: Manual    Wheelchair assist level: Supervision/Verbal cueing Max wheelchair distance: 150 ft    Wheelchair 50 feet with 2 turns activity    Assist        Assist Level: Supervision/Verbal cueing   Wheelchair 150 feet activity     Assist      Assist Level: Supervision/Verbal cueing   Blood pressure 136/75, pulse (!) 59, temperature 97.9 F (36.6 C), temperature source Oral, resp. rate 18, height 6' (1.829 m), weight 96.2 kg, SpO2 96%.  Medical Problem List and Plan: 1. 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   Antithrombotics: -DVT/anticoagulation:  Mechanical: Antiembolism stockings, thigh (TED hose) Bilateral lower extremities             -antiplatelet therapy: continue Plavix  75 mg daily  3. Knee OA: continue Voltaren  gel 4 times daily oxycodone  as needed, tramadol  as needed, knee sleeves ordered  4/19- 4/20no pain of any kind per pt- con't regimen 4. Mood/Behavior/Sleep: Provide emotional support             -antipsychotic agents: N/A 5. Neuropsych/cognition: This patient is capable of making decisions on his own behalf. 6. Skin/Wound Care:   -4 x 4 gauze dampened with Vashe to the  right Achilles ulcer with kerlix and Ace wrap from the metatarsal heads up to the tubercle.   -PRAFO right foot -I reapplied the dressing today 4/19- Wound looks great- granulating well- with scant serous drainage.  7. Fluids/Electrolytes/Nutrition: Routine INO's with follow-up chemistries 8.  Left knee pain.  Lower extremity Dopplers negative.  X-ray showed chronic arthritis.  Trial of colchicine .  Uric acid normal limits 9.  Hyponatremia/SIADH.  Fluid restriction.    4/14 stable Na 136  4/19- Latest Na 139 10.  ID/bacteremia.  Was on combination of IV penicillin  and Zyvox  stopped 06/21/2023 changed to p.o. amoxicillin  with stop date 07/04/2023  4/20- last day today 11.  Atrial fibrillation.  Rate controlled with oral beta-blocker/Toprol -XL 25 mg every 12 hours.Librexa AF trial drug (comparing Eliquis  versus milvexian-factor XI inhibitor) 12.  New diagnosis of diabetes mellitus-2.  A1c 6.4.  Currently on SSI.  Plan metformin  on discharge.             -reasonable control at present  CBG (last 3)  No results for input(s): "GLUCAP" in the last 72 hours.  4/19- not checking CBGs since 4/13   13.  Hypertension.  Continue metoprolol .  HCTZ currently on hold.    07/05/2023    3:10 AM 07/04/2023    8:24 PM 07/04/2023    8:05 PM  Vitals with BMI  Weight 212 lbs 1 oz    BMI 28.76    Systolic 136 124 161  Diastolic 75  66 66  Pulse 59 68 68  Overall stable/controlled  4/19- BP slightly elevated this AM- will monitor- if need be, will restart hydrochlorothiazide    4/20- BP looking better this AM 14.  History of prostate cancer. continue Pro scar 5 mg daily 15.  Hyperlipidemia.  Conitnue Zetia /Crestor   16.  Atelectasis: incentive spirometer ordered  17.  Hiccups.  Appear to have resolved, d/c baclofen   18. Overweight: provide dietary education  19. Vitamin D  insufficiency: start D3 daily, continue  20. Transaminitis: d/c tylenol   21. Bowel incontinence: may be 2/2 colchicine    22. Lower extremity edema: lasix  20mg  ordered x1 on 4/16, 40 ordered on 4/17, 20mg  lasix  ordered 4/18  Lasix  20mg  ordered 4/21  23. Nocturia: d/c flomax  given blisters on left foot in case these are side effect of the medication  24. HTN: resolved, 20mg  lasix  ordered 4/18 and 4/21  26. Loose stool: avoid laxatives, last BM 4/21  27. Blisters on left foot/calf:  Topical manuka honey ordered for healing  28. Bradycardia: continue to monitor HR TID   LOS: 10 days A FACE TO FACE EVALUATION WAS PERFORMED  Keven Pel [place] 07/05/2023, 10:34 AM

## 2023-07-06 ENCOUNTER — Other Ambulatory Visit (HOSPITAL_COMMUNITY): Payer: Self-pay

## 2023-07-06 ENCOUNTER — Ambulatory Visit (HOSPITAL_BASED_OUTPATIENT_CLINIC_OR_DEPARTMENT_OTHER): Admitting: General Surgery

## 2023-07-06 MED ORDER — HYDROCHLOROTHIAZIDE 12.5 MG PO TABS
12.5000 mg | ORAL_TABLET | Freq: Every day | ORAL | 0 refills | Status: DC
  Filled 2023-07-06: qty 30, 30d supply, fill #0

## 2023-07-06 MED ORDER — DICLOFENAC SODIUM 1 % EX GEL
2.0000 g | Freq: Four times a day (QID) | CUTANEOUS | 0 refills | Status: DC
  Filled 2023-07-06: qty 100, 13d supply, fill #0

## 2023-07-06 MED ORDER — MEDIHONEY WOUND/BURN DRESSING EX PSTE
1.0000 | PASTE | Freq: Every day | CUTANEOUS | 0 refills | Status: DC
  Filled 2023-07-06: qty 44, 44d supply, fill #0

## 2023-07-06 MED ORDER — VITAMIN D3 25 MCG PO TABS
1000.0000 [IU] | ORAL_TABLET | Freq: Every day | ORAL | 0 refills | Status: DC
  Filled 2023-07-06: qty 30, 30d supply, fill #0

## 2023-07-06 MED ORDER — EZETIMIBE 10 MG PO TABS
10.0000 mg | ORAL_TABLET | Freq: Every day | ORAL | 0 refills | Status: DC
  Filled 2023-07-06: qty 30, 30d supply, fill #0

## 2023-07-06 MED ORDER — OXYCODONE HCL 5 MG PO TABS
5.0000 mg | ORAL_TABLET | Freq: Three times a day (TID) | ORAL | 0 refills | Status: DC | PRN
Start: 2023-07-06 — End: 2023-08-10
  Filled 2023-07-06: qty 30, 10d supply, fill #0

## 2023-07-06 MED ORDER — CLOPIDOGREL BISULFATE 75 MG PO TABS
75.0000 mg | ORAL_TABLET | Freq: Every day | ORAL | 0 refills | Status: DC
  Filled 2023-07-06: qty 30, 30d supply, fill #0

## 2023-07-06 MED ORDER — COLCHICINE 0.6 MG PO TABS
0.6000 mg | ORAL_TABLET | Freq: Every day | ORAL | 0 refills | Status: DC
  Filled 2023-07-06: qty 30, 30d supply, fill #0

## 2023-07-06 MED ORDER — PANTOPRAZOLE SODIUM 40 MG PO TBEC
40.0000 mg | DELAYED_RELEASE_TABLET | Freq: Every day | ORAL | 0 refills | Status: DC
  Filled 2023-07-06: qty 30, 30d supply, fill #0

## 2023-07-06 MED ORDER — METOPROLOL SUCCINATE ER 25 MG PO TB24
25.0000 mg | ORAL_TABLET | Freq: Two times a day (BID) | ORAL | 0 refills | Status: DC
  Filled 2023-07-06: qty 60, 30d supply, fill #0

## 2023-07-06 MED ORDER — ROSUVASTATIN CALCIUM 20 MG PO TABS
20.0000 mg | ORAL_TABLET | Freq: Every day | ORAL | 0 refills | Status: DC
  Filled 2023-07-06: qty 30, 30d supply, fill #0

## 2023-07-06 MED ORDER — VITAMIN B-12 1000 MCG PO TABS
500.0000 ug | ORAL_TABLET | Freq: Every day | ORAL | 0 refills | Status: DC
  Filled 2023-07-06: qty 15, 30d supply, fill #0

## 2023-07-06 MED ORDER — FINASTERIDE 5 MG PO TABS
5.0000 mg | ORAL_TABLET | Freq: Every day | ORAL | 0 refills | Status: DC
  Filled 2023-07-06: qty 30, 30d supply, fill #0

## 2023-07-06 MED ORDER — FUROSEMIDE 20 MG PO TABS
20.0000 mg | ORAL_TABLET | Freq: Once | ORAL | Status: AC
  Administered 2023-07-06: 20 mg via ORAL
  Filled 2023-07-06: qty 1

## 2023-07-06 MED ORDER — METFORMIN HCL 500 MG PO TABS
500.0000 mg | ORAL_TABLET | Freq: Every day | ORAL | 0 refills | Status: DC
  Filled 2023-07-06: qty 30, 30d supply, fill #0

## 2023-07-06 NOTE — Progress Notes (Signed)
 Occupational Therapy Session Note  Patient Details  Name: Logan French MRN: [id number] Date of Birth: September 23, 1934  Today's Date: 07/06/2023 OT Individual Time: 4098-1191 OT Individual Time Calculation (min): 70 min    Short Term Goals: Week 2:  OT Short Term Goal 1 (Week 2): STG = LTG due to ELOS  Skilled Therapeutic Interventions/Progress Updates:  Skilled OT intervention completed with focus on ambulatory and cardiovascular endurance. Pt received seated EOB, agreeable to session. Stiffness reported in bilateral calves however declined pain and intervention. Improved with ambulation and gentle stance stretching.   Pt declined shower this AM. Pt completed all sit > stands and ambulatory transfers > than household distance with mod I using rollator.  Pt ambulated about 300 ft > gym with noted crouched posture as compared to previous sessions. OT adjusted the height of bilateral handles on his personal rollator to promote functional upright posture for improved balance and visual scanning.   Pt with improved posture at new height in stance. Pt with continued questions regarding readiness to drive; encouraged pt to discuss with MD for official clearance however plan to address visual skills needed for driving. To promote visual scanning, pt ambulated > 500 ft while scanning L<>R to locate squigz at eye level. Pt with difficulty dual tasking active ambulation while scanning, rather stopping frequently to turn entire body towards each wall. Pt reports having baseline compensation method due to L lazy eye, however discussed how turning head for driving is essential for turns and being aware of surroundings. Pt able to locate all squigz without cues however extended time due to slow pace rather than normal pace.  Pt completed the following intervals on nustep utilizing trail runner for longevity of task, to promote global/cardiovascular endurance needed for independence with BADLs and functional  mobility: -6 mins, level 4 -5 mins, level 4 Intermittent rest break needed for fatigue.  Pt ambulated back to room about 300 ft. OT cleared pt for mod I mobility and toileting in room; nursing notified via sign outside door. Pt remained seated in w/c, with all needs in reach at end of session.   Therapy Documentation Precautions:  Precautions Precautions: Fall Precaution/Restrictions Comments: chronic bilateral knee pain, RLE achilles wound, LLE blisters Required Braces or Orthoses: Other Brace Other Brace: PRAFO RLE when in bed Restrictions Weight Bearing Restrictions Per Provider Order: No    Therapy/Group: Individual Therapy  Ruthanna Covert, MS, OTR/L  07/06/2023, 9:18 AM

## 2023-07-06 NOTE — Progress Notes (Signed)
 PROGRESS NOTE   Subjective/Complaints: He would like to return to driving, Hope is optimistic given his progress, discussed starting to drive in a parking lot with his wife next to him.     ROS: +creakiness in his joints- improved, +popped blisters on left foot, +lower extremity edema    Objective:   No results found. No results for input(s): "WBC", "HGB", "HCT", "PLT" in the last 72 hours.  No results for input(s): "NA", "K", "CL", "CO2", "GLUCOSE", "BUN", "CREATININE", "CALCIUM " in the last 72 hours.    Intake/Output Summary (Last 24 hours) at 07/06/2023 0914 Last data filed at 07/05/2023 1833 Gross per 24 hour  Intake 436 ml  Output --  Net 436 ml        Physical Exam: Vital Signs Blood pressure (!) 146/87, pulse 64, temperature 98 F (36.7 C), temperature source Oral, resp. rate 18, height 6' (1.829 m), weight 96.2 kg, SpO2 98%.  Physical Exam    General: awake, alert, appropriate, sitting EOB on phone; NAD HENT: conjugate gaze; oropharynx moist CV: Bradycardia Pulmonary: CTA B/L; no W/R/R- good air movement GI: soft, NT, ND, (+)BS Psychiatric: appropriate- very interactive Neurological: Ox3  Extremities:-B/L LE edema 1+ at feet -much better in feet and  calves, but knees, above ACE wrap (that was on yesterday) 2+ edema at knees/just above knees B/L Skin: L calf blister unpopped still  and L base of great toe and top of L foot skin mildly sloughing from popped blisters and R achilles back of distal calf- wound- ~ 2-2.5 inches- more superficial based on pics- has good granulation and scant serous drainage.  Musculoskeletal:        General: Swelling present.     Cervical back: Normal range of motion.     Right lower leg: Edema present. Improved    Left lower leg: Edema present. Improved    Comments: Chronic changes to both knees with mild effusions bilaterally.   Skin: Blisters on left foot    General:  Skin is warm.     Comments: Right lower extremity wound measures about 1.5x 4cm in diameter and is generally pink and dry. 4x4 gauze, kerlix, ACE wrap in place with PRAFO. Some redness along right lateral leg which appears mostly due to external rotation of leg/contact with bed. Old knee incision from ACL repair right.   Neurological:     Mental Status: He is alert.     Comments: Alert and oriented x 3. Normal insight and awareness. Intact Memory. Normal language and speech. Cranial nerve exam unremarkable except for dysconjugate gaze. MMT: BUE 4 to 4+/5. RLE 2-3/5 HF, KE and 4-/5 ADF/PF. LLE 3/5 HF, KE and 4+/5 ADF/PF. Sensory exam normal for light touch and pain in all 4 limbs. No limb ataxia or cerebellar signs. No abnormal tone appreciated.  Stable 4/22 Psychiatric:        Mood and Affect: Mood normal.        Behavior: Behavior normal.   Assessment/Plan: 1. Functional deficits which require 3+ hours per day of interdisciplinary therapy in a comprehensive inpatient rehab setting. Physiatrist is providing close team supervision and 24 hour management of active medical problems listed below. Physiatrist  and rehab team continue to assess barriers to discharge/monitor patient progress toward functional and medical goals  Care Tool:  Bathing    Body parts bathed by patient: Right arm, Left arm, Chest, Abdomen, Front perineal area, Right upper leg, Left upper leg, Right lower leg, Left lower leg, Face, Buttocks   Body parts bathed by helper: Buttocks     Bathing assist Assist Level: Set up assist     Upper Body Dressing/Undressing Upper body dressing   What is the patient wearing?: Pull over shirt    Upper body assist Assist Level: Independent with assistive device    Lower Body Dressing/Undressing Lower body dressing      What is the patient wearing?: Pants, Incontinence brief     Lower body assist Assist for lower body dressing: Independent with assitive device      Toileting Toileting    Toileting assist Assist for toileting: Independent with assistive device     Transfers Chair/bed transfer  Transfers assist     Chair/bed transfer assist level: Supervision/Verbal cueing     Locomotion Ambulation   Ambulation assist      Assist level: Supervision/Verbal cueing Assistive device: Rollator Max distance: 616ft   Walk 10 feet activity   Assist     Assist level: Supervision/Verbal cueing Assistive device: Rollator   Walk 50 feet activity   Assist Walk 50 feet with 2 turns activity did not occur: Safety/medical concerns  Assist level: Supervision/Verbal cueing Assistive device: Rollator    Walk 150 feet activity   Assist Walk 150 feet activity did not occur: Safety/medical concerns  Assist level: Supervision/Verbal cueing Assistive device: Rollator    Walk 10 feet on uneven surface  activity   Assist Walk 10 feet on uneven surfaces activity did not occur: Safety/medical concerns   Assist level: Supervision/Verbal cueing Assistive device: Rollator   Wheelchair     Assist Is the patient using a wheelchair?: Yes Type of Wheelchair: Manual    Wheelchair assist level: Supervision/Verbal cueing Max wheelchair distance: 150 ft    Wheelchair 50 feet with 2 turns activity    Assist        Assist Level: Supervision/Verbal cueing   Wheelchair 150 feet activity     Assist      Assist Level: Supervision/Verbal cueing   Blood pressure (!) 146/87, pulse 64, temperature 98 F (36.7 C), temperature source Oral, resp. rate 18, height 6' (1.829 m), weight 96.2 kg, SpO2 98%.  Medical Problem List and Plan: 1. 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  Antithrombotics: -DVT/anticoagulation:  Mechanical: Antiembolism stockings, thigh (TED hose) Bilateral lower extremities             -antiplatelet therapy: continue Plavix  75 mg daily  3. Knee OA: continue Voltaren  gel 4 times daily oxycodone  as needed, tramadol  as needed, knee sleeves ordered  4/19- 4/20no pain of any kind per pt- con't regimen 4. Mood/Behavior/Sleep: Provide emotional support             -antipsychotic agents: N/A 5. Neuropsych/cognition: This patient is capable of making decisions on his own behalf. 6. Skin/Wound Care:   -4 x  4 gauze dampened with Vashe to the right Achilles ulcer with kerlix and Ace wrap from the metatarsal heads up to the tubercle.   -PRAFO right foot -I reapplied the dressing today 4/19- Wound looks great- granulating well- with scant serous drainage.  7. Fluids/Electrolytes/Nutrition: Routine INO's with follow-up chemistries 8.  Left knee pain.  Lower extremity Dopplers negative.  X-ray showed chronic arthritis.  Trial of colchicine .  Uric acid normal limits 9.  Hyponatremia/SIADH.  Fluid restriction.    4/14 stable Na 136  4/19- Latest Na 139 10.  ID/bacteremia.  Was on combination of IV penicillin  and Zyvox  stopped 06/21/2023 changed to p.o. amoxicillin  with stop date 07/04/2023  4/20- last day today 11.  Atrial fibrillation.  Rate controlled with oral beta-blocker/Toprol -XL 25 mg every 12 hours.Librexa AF trial drug (comparing Eliquis  versus milvexian-factor XI inhibitor) 12.  New diagnosis of diabetes mellitus-2.  A1c 6.4.  Currently on SSI.  Plan metformin  on discharge.             -reasonable control at present  CBG (last 3)  No results for input(s): "GLUCAP" in the last 72 hours.  4/19- not checking CBGs since 4/13   13.  Hypertension.  Continue metoprolol .  HCTZ currently on hold.    07/06/2023    4:19 AM 07/05/2023    8:18 PM 07/05/2023    1:06 PM  Vitals with BMI   Systolic 146 133 409  Diastolic 87 77 76  Pulse 64 68 70  Overall stable/controlled  4/19- BP slightly elevated this AM- will monitor- if need be, will restart hydrochlorothiazide    4/20- BP looking better this AM 14.  History of prostate cancer. continue Pro scar 5 mg daily 15.  Hyperlipidemia.  Conitnue Zetia /Crestor   16.  Atelectasis: incentive spirometer ordered  17.  Hiccups.  Appear to have resolved, d/c baclofen   18. Overweight: provide dietary education  19. Vitamin D  insufficiency: start D3 daily, continue  20. Transaminitis: d/c tylenol   21. Bowel incontinence: may be 2/2 colchicine    22. Lower extremity edema: lasix  20mg  ordered x1 on 4/16, 40 ordered on 4/17, 20mg  lasix  ordered 4/18  Lasix  20mg  ordered 4/21 and 4/22, will ask PA to send patient home with prn Lasix   23. Nocturia: d/c flomax  given blisters on left foot in case these are side effect of the medication  24. HTN: resolved, 20mg  lasix  ordered 4/18 and 4/21, lasix  20 ordered on 4/22  26. Loose stool: avoid laxatives, last BM 4/21  27. Blisters on left foot/calf:  Topical manuka honey ordered for healing, continue  28. Bradycardia: continue to monitor HR TID   LOS: 11 days A FACE TO FACE EVALUATION WAS PERFORMED  Keven Pel Mya Suell 07/06/2023, 9:14 AM

## 2023-07-06 NOTE — Progress Notes (Signed)
 Occupational Therapy Session Note  Patient Details  Name: Logan French MRN: [id number] Date of Birth: 1934/03/29  Today's Date: 07/06/2023 OT Individual Time: 1115-1207 OT Individual Time Calculation (min): 52 min    Short Term Goals: Week 1:  OT Short Term Goal 1 (Week 1): Pt will complete sit > stand in prep for ADL with CGA using LRAD OT Short Term Goal 1 - Progress (Week 1): Met OT Short Term Goal 2 (Week 1): Pt will engage in ADL task at sink in stance for 3 mins with min A to promote standing tolerance OT Short Term Goal 2 - Progress (Week 1): Met OT Short Term Goal 3 (Week 1): Pt will utilize AE PRN to thread LB clothing with supervision OT Short Term Goal 3 - Progress (Week 1): Met Week 2:  OT Short Term Goal 1 (Week 2): STG = LTG due to ELOS     Skilled Therapeutic Interventions/Progress Updates:    Pt received in wc ready to shower.  He explained the procedure used to cover his lower limbs while he showers.  OT obtained bags and tape.  Pt had clothing ready to wear today.  He stood up from wc and used RW to ambulate to bathroom, stood to toilet and then doffed his clothing all with modified independent.  Pt sat on seat outside of shower for therapist to wrap his legs with plastic bags.  Supervision to step into shower mostly due to pt having plastic bags on his feet vs him being a fall risk.  Once in the shower pt sat on seat and then completed shower with no assist or supervision needed. Pt dried off and then stepped out to sit on seat outside of shower for therapist to doff plastic bags from legs. Pt then proceeded to dress without A.    Pt resting in wc with all needs met.   Therapy Documentation Precautions:  Precautions Precautions: Fall Precaution/Restrictions Comments: chronic bilateral knee pain, RLE achilles wound, LLE blisters Required Braces or Orthoses: Other Brace Other Brace: PRAFO RLE when in bed Restrictions Weight Bearing Restrictions Per Provider  Order: No   Pain:  No c/o pain  ADL: ADL Equipment Provided: Reacher Eating: Independent Where Assessed-Eating: Wheelchair Grooming: Modified independent Where Assessed-Grooming: Sitting at sink Upper Body Bathing: Setup Where Assessed-Upper Body Bathing: Shower Lower Body Bathing: Setup Where Assessed-Lower Body Bathing: Shower Upper Body Dressing: Modified independent (Device) Where Assessed-Upper Body Dressing: Sitting at sink Lower Body Dressing: Modified independent Where Assessed-Lower Body Dressing: Sitting at sink, Standing at sink Toileting: Modified independent Where Assessed-Toileting: Teacher, adult education: Modified Community education officer Method: Proofreader: Raised toilet seat, Other (comment) (rollator) Tub/Shower Transfer: Not assessed Tub/Shower Transfer Method: Unable to assess Film/video editor: Close supervision Film/video editor Method: Designer, industrial/product: Information systems manager with back, Other (comment) (rollator)     Therapy/Group: Individual Therapy  Anwar Crill 07/06/2023, 10:14 AM

## 2023-07-06 NOTE — Progress Notes (Signed)
 Physical Therapy Session Note  Patient Details  Name: Logan French MRN: [id number] Date of Birth: 1934/08/07  Today's Date: 07/06/2023 PT Individual Time: 1000-1044 PT Individual Time Calculation (min): 44 min   Short Term Goals: Week 1:  PT Short Term Goal 1 (Week 1): pt will perform STS with min a consistently PT Short Term Goal 2 (Week 1): Pt will ambulate x 30 ft with assist PT Short Term Goal 3 (Week 1): Pt will initiate stair training  Skilled Therapeutic Interventions/Progress Updates:     Pt up ambulating in the room with the RW, finishing up going to the bathroom. (Pt made mod I in room from primary therapies). Pt completes handwashing at the sink mod I with RW support for balance. Pt reports no pain and no discomfort today. Pt assisted with donning briefs and cargo shorts for time management.   Session focused on addressing bed mobility in ADL apartment on regular bed without hospital bed features. Pt ambulated from his room to ADL apartment ~188ft with distant supervision and rollator. Completed bed mobility independently on flat bed without difficulty for either direction. Pt confirms similar bed height at home. Pt requesting to practice stepping over thresholds to simulate walk-in shower setup - completed with supervision assist with min cues for setup and safety. He also completed furniture transfers from recliner with mod I level using his rollator - discussed strategies at home to keep his RLE elevated for wound healing. Finished session completing UE ergometer Nustep with Balance Power program for x5 minutes (L4 for 3 minutes and L2 for 2 minutes) in standing position - pt with ~60/40% favoriting his R side so worked on equal power and strengthening through both Ue's. Pt reports fatigue after this and HR checked at 76 with O2 at 100% on RA.   Pt ambulated back to his room mod I with rollator and left sitting in wheelchair with BLE propped on bed. All his needs met at end.     Therapy Documentation Precautions:  Precautions Precautions: Fall Precaution/Restrictions Comments: chronic bilateral knee pain, RLE achilles wound, LLE blisters Required Braces or Orthoses: Other Brace Other Brace: PRAFO RLE when in bed Restrictions Weight Bearing Restrictions Per Provider Order: No     Therapy/Group: Individual Therapy  Pheobe Brass 07/06/2023, 7:43 AM

## 2023-07-06 NOTE — Progress Notes (Signed)
 Occupational Therapy Session Note  Patient Details  Name: Logan French MRN: [id number] Date of Birth: 07-08-1934  Today's Date: 07/06/2023 OT Individual Time: 1345-1419 OT Individual Time Calculation (min): 34 min    Short Term Goals: Week 1:  OT Short Term Goal 1 (Week 1): Pt will complete sit > stand in prep for ADL with CGA using LRAD OT Short Term Goal 1 - Progress (Week 1): Met OT Short Term Goal 2 (Week 1): Pt will engage in ADL task at sink in stance for 3 mins with min A to promote standing tolerance OT Short Term Goal 2 - Progress (Week 1): Met OT Short Term Goal 3 (Week 1): Pt will utilize AE PRN to thread LB clothing with supervision OT Short Term Goal 3 - Progress (Week 1): Met Week 2:  OT Short Term Goal 1 (Week 2): STG = LTG due to ELOS  Skilled Therapeutic Interventions/Progress Updates:      Therapy Documentation Precautions:  Precautions Precautions: Fall Precaution/Restrictions Comments: chronic bilateral knee pain, RLE achilles wound, LLE blisters Required Braces or Orthoses: Other Brace Other Brace: PRAFO RLE when in bed Restrictions Weight Bearing Restrictions Per Provider Order: No General: "Can we practice getting in the car?" Pt seated in W/C upon OT arrival, agreeable to OT .  Pain: no pain reported  Exercises: OT providing skilled intervention with functional mobility and car transfer techniques for D/C planning. Pt reported granted permission to drive from MD so wanted to practice getting in/out of car. Pt able to ambulate to/from therapy gym with rollator at Mod I. Pt able to approach car at appropriate angle to manage door, OT providing VC for managing rollator to back up and sit in car seat so rollator is in good position to manage into car. Pt able to bring BLE into car. OT challenging pt to manage rollator into car, pt required Min A to manage AD into car. OT discussion RW potentially easier for pt to manage into car d/t decreased weight  compared to rollator.    Pt seated in W/C at end of session and 4Ps assessed, pt mod I in room.    Therapy/Group: Individual Therapy  Nila Barth, OTD, OTR/L 07/06/2023, 4:11 PM

## 2023-07-06 NOTE — Progress Notes (Signed)
 Physical Therapy Discharge Summary  Patient Details  Name: Logan French MRN: [id number] Date of Birth: 1935-03-16  Date of Discharge from PT service:July 06, 2023  Today's Date: 07/06/2023 PT Individual Time: 4098-1191 PT Individual Time Calculation (min): 41 min   Patient has met 7 of 7 long term goals due to improved activity tolerance, improved balance, improved postural control, increased strength, increased range of motion, decreased pain, and improved coordination.  Patient to discharge at an ambulatory level Modified Independent using rollator. Patient's care partner is independent to provide the necessary physical assistance at discharge. Pt's wife attended family education training on 4/18 and verbalized confidence with all tasks to ensure safe discharge home.   All goals met  Recommendation:  Patient will benefit from ongoing skilled PT services in outpatient setting to continue to advance safe functional mobility, address ongoing impairments in transfers, generalized strengthening and endurance, dynamic standing balance/coordination, gait training, and to minimize fall risk.  Equipment: Rollator  Reasons for discharge: treatment goals met  Patient/family agrees with progress made and goals achieved: Yes  Today's Interventions: Received pt sitting in WC, pt agreeable to PT treatment and denied any pain during session. Session with emphasis on discharge planning, functional mobility/transfers, generalized strengthening and endurance, dynamic standing balance/coordination, toileting, and ambulation. Pt reported getting clearance from MD for return to driving and discussed RW vs rollator with RW being less maintenance than rollator. Encouraged pt to start off with wife present in car and in empty parking lot for safety. Pt then with questions regarding different outpatient therapy clinics - CSW notified.   Went through sensation, MMT, and pain interference questionnaire in  preparation for discharge. Pt performed all transfers with rollator and mod I throughout session. Pt ambulated in/out of bathroom with RW and mod I. Pt opting to step out of brief/pants in standing, despite recommendation to sit (did so mod I) and pt able to stand and void/perform hygiene management mod I. Picked up brief/pants off floor with RW and mod I and returned to [facility] and donned brief/pants seated<>standing with RW and mod I.   Pt then ambulated additional >358ft x 2 trials with rollator and mod I to/from dayroom with emphasis on global strengthening/endurance - 1 seated rest break on rollator. Pt verbalized confidence with all tasks and feels prepared for discharge tomorrow. Returned to room concluded session with pt sitting in [facility] with all needs within reach. Pt elevated BLE on bed for edema management.   PT Discharge Precautions/Restrictions Precautions Precautions: Fall Precaution/Restrictions Comments: chronic bilateral knee pain, RLE achilles wound, LLE blisters Required Braces or Orthoses: Other Brace Other Brace: PRAFO RLE when in bed Restrictions Weight Bearing Restrictions Per Provider Order: No Pain Interference Pain Interference Pain Effect on Sleep: 0. Does not apply - I have not had any pain or hurting in the past 5 days Pain Interference with Therapy Activities: 0. Does not apply - I have not received rehabilitationtherapy in the past 5 days Pain Interference with Day-to-Day Activities: 1. Rarely or not at all Cognition Overall Cognitive Status: Within Functional Limits for tasks assessed Arousal/Alertness: Awake/alert Orientation Level: Oriented X4 Memory: Appears intact Awareness: Appears intact Problem Solving: Appears intact Safety/Judgment: Appears intact Sensation Sensation Light Touch: Appears Intact Hot/Cold: Not tested Proprioception: Appears Intact Stereognosis: Not tested Coordination Gross Motor Movements are Fluid and Coordinated: Yes Fine Motor  Movements are Fluid and Coordinated: Yes Finger Nose Finger Test: [facility] bilaterally Heel Shin Test: [facility] bilaterally Motor  Motor Motor: Within Functional Limits  Mobility Bed Mobility Bed Mobility: Rolling Right;Rolling Left;Sit to Supine;Supine to Sit Rolling Right: Independent Rolling Left: Independent Supine to Sit: Independent Sit to Supine: Independent Transfers Transfers: Sit to Stand;Stand to Sit;Stand Pivot Transfers Sit to Stand: Independent with assistive device Stand to Sit: Independent with assistive device Stand Pivot Transfers: Independent with assistive device Transfer (Assistive device): Rollator Locomotion  Gait Ambulation: Yes Gait Assistance: Independent with assistive device Gait Distance (Feet): 300 Feet Assistive device: Rollator Gait Gait: Yes Gait Pattern: Impaired Gait Pattern: Decreased stride length;Poor foot clearance - left;Poor foot clearance - right;Narrow base of support Gait velocity: decreased Stairs / Additional Locomotion Stairs: Yes Stairs Assistance: Supervision/Verbal cueing Stair Management Technique: One rail Right Number of Stairs: 12 Height of Stairs: 6 Ramp: Independent with assistive device (rollator) Wheelchair Mobility Wheelchair Mobility: No  Trunk/Postural Assessment  Cervical Assessment Cervical Assessment: Within Functional Limits Thoracic Assessment Thoracic Assessment: Within Functional Limits Lumbar Assessment Lumbar Assessment: Within Functional Limits Postural Control Postural Control: Within Functional Limits  Balance Balance Balance Assessed: Yes Static Sitting Balance Static Sitting - Balance Support: Feet supported;No upper extremity supported Static Sitting - Level of Assistance: 7: Independent Dynamic Sitting Balance Dynamic Sitting - Balance Support: Feet supported;No upper extremity supported Dynamic Sitting - Level of Assistance: 6: Modified independent (Device/Increase time) Static Standing  Balance Static Standing - Balance Support: Bilateral upper extremity supported;During functional activity (rollator) Static Standing - Level of Assistance: 6: Modified independent (Device/Increase time) Dynamic Standing Balance Dynamic Standing - Balance Support: Bilateral upper extremity supported;During functional activity (rollator) Dynamic Standing - Level of Assistance: 6: Modified independent (Device/Increase time) Dynamic Standing - Comments: with transfers and gait Extremity Assessment  RLE Assessment RLE Assessment: Exceptions to [facility] General Strength Comments: tested sitting in [facility] RLE Strength Right Hip Flexion: 4/5 Right Hip ABduction: 4/5 Right Hip ADduction: 4/5 Right Knee Flexion: 4-/5 Right Knee Extension: 4-/5 Right Ankle Dorsiflexion: 4/5 Right Ankle Plantar Flexion: 4/5 LLE Assessment LLE Assessment: Exceptions to [facility] General Strength Comments: tested sitting in WC LLE Strength Left Hip Flexion: 4-/5 Left Hip ABduction: 4/5 Left Hip ADduction: 4/5 Left Knee Flexion: 4-/5 Left Knee Extension: 4/5 Left Ankle Dorsiflexion: 4/5 Left Ankle Plantar Flexion: 4/5   Nicolas Barren Zaunegger Nena Bank PT, DPT 07/06/2023, 6:56 AM

## 2023-07-06 NOTE — Plan of Care (Signed)

## 2023-07-06 NOTE — Progress Notes (Signed)
 Inpatient Rehabilitation Care Coordinator Discharge Note   Patient Details  Name: Logan French MRN: [id number] Date of Birth: 09-23-1934   Discharge location: D/c to home  Length of Stay: 11 days  Discharge activity level: Modified Independent using rollator  Home/community participation: Limited  Patient response [id number] Literacy - How often do you need to have someone help you when you read instructions, pamphlets, or other written material from your doctor or pharmacy?: Never  Patient response [id number] Isolation - How often do you feel lonely or isolated from those around you?: Never  Services provided included: MD, RD, PT, OT, RN, TR, Pharmacy, Neuropsych, SW, CM  Financial Services:  Financial Services Utilized: Medicare    Choices offered to/list presented to: patient  Follow-up services arranged:  Outpatient, DME    Outpatient Servicies: [facility] for PT/OT DME : [employer] for rollator and shower chair with back and armrests    Patient response to transportation need: Is the patient able to respond to transportation needs?: Yes In the past 12 months, has lack of transportation kept you from medical appointments or from getting medications?: No In the past 12 months, has lack of transportation kept you from meetings, work, or from getting things needed for daily living?: No   Patient/Family verbalized understanding of follow-up arrangements:  Yes  Individual responsible for coordination of the follow-up plan: contact pt  Confirmed correct DME delivered: Rennis Case 07/06/2023    Comments (or additional information):family edu completed  Summary of Stay    Date/Time Discharge Planning CSW  06/29/23 1059 Pt will d/c to home with his wife. He will need to be intermittent supevision to Mod I due to limited support from wife since she works PT. SW will confirm there are no barriers to discharge. AAC       Jovi Alvizo A Brendolyn Callas

## 2023-07-07 ENCOUNTER — Other Ambulatory Visit (HOSPITAL_COMMUNITY): Payer: Self-pay

## 2023-07-07 ENCOUNTER — Other Ambulatory Visit (HOSPITAL_COMMUNITY): Payer: Self-pay | Admitting: Pharmacist

## 2023-07-07 MED ORDER — HYDROCHLOROTHIAZIDE 12.5 MG PO TABS
12.5000 mg | ORAL_TABLET | Freq: Every day | ORAL | Status: DC
  Administered 2023-07-07: 12.5 mg via ORAL
  Filled 2023-07-07: qty 1

## 2023-07-07 MED ORDER — STUDY - LIBREXIA-AF - APIXABAN 5 MG OR PLACEBO CAPSULE (PI-SETHI): 5.0000 mg | ORAL_CAPSULE | Freq: Two times a day (BID) | ORAL | 0 refills | Status: DC

## 2023-07-07 MED ORDER — STUDY - LIBREXIA-AF - JNJ-70033093 (MILVEXIAN) 100 MG OR PLACEBO (PI-SETHI): 1.0000 | ORAL_TABLET | Freq: Two times a day (BID) | ORAL | 0 refills | Status: DC

## 2023-07-07 NOTE — Progress Notes (Signed)
 Inpatient Rehabilitation Discharge Medication Review by a Pharmacist   A complete drug regimen review was completed for this patient to identify any potential clinically significant medication issues.   High Risk Drug Classes Is patient taking? Indication by Medication  Antipsychotic No    Anticoagulant Yes Study medication: Librexia - AFib  Antibiotic No    Opioid Yes Oxycodone  - PRN pain  Antiplatelet Yes Plavix -CVA px  Hypoglycemics/insulin  Yes Metformin -DM  Vasoactive Medication Yes Hydrochlorothiazide  - HTN Metoprolol  XL - Afib HTN  Chemotherapy No    Other Yes Diclofenac  gel - topical pain Manuka honey - wound care Cholecalciferol , cyanocobalamin  - supplementation Acetaminophen  - pain Ezetimibe , rosuvastatin  - HLD Finasteride  - BPH Pantoprazole  - GERD      Type of Medication Issue Identified Description of Issue Recommendation(s)  Drug Interaction(s) (clinically significant)     Duplicate Therapy     Allergy     No Medication Administration End Date     Incorrect Dose     Additional Drug Therapy Needed     Significant med changes from prior encounter (inform family/care partners about these prior to discharge). Insulin  discontinued Communicate relevant medication changes to patient/family members at discharge from CIR.   Other       Clinically significant medication issues were identified that warrant physician communication and completion of prescribed/recommended actions by midnight of the next day:  No  Pharmacist comments: n/a   Time spent performing this drug regimen review (minutes): 20   Thank you for allowing pharmacy to be a part of this patient's care.  Claudia Cuff, PharmD, BCPS Clinical Pharmacist

## 2023-07-07 NOTE — Progress Notes (Signed)
 PROGRESS NOTE   Subjective/Complaints: Participating in atrial fibrillation trial and speaking with neurology Ready for d/c today No issues ON   ROS: +creakiness in his joints- improved, +popped blisters on left foot, +lower extremity edema- stable    Objective:   No results found. No results for input(s): "WBC", "HGB", "HCT", "PLT" in the last 72 hours.  No results for input(s): "NA", "K", "CL", "CO2", "GLUCOSE", "BUN", "CREATININE", "CALCIUM " in the last 72 hours.    Intake/Output Summary (Last 24 hours) at 07/07/2023 0851 Last data filed at 07/06/2023 1400 Gross per 24 hour  Intake 474 ml  Output --  Net 474 ml        Physical Exam: Vital Signs Blood pressure 132/76, pulse (!) 53, temperature 97.8 F (36.6 C), resp. rate 17, height 6' (1.829 m), weight 96.2 kg, SpO2 97%.  Physical Exam    General: awake, alert, appropriate, sitting EOB on phone; NAD HENT: conjugate gaze; oropharynx moist CV: Bradycardia Pulmonary: CTA B/L; no W/R/R- good air movement GI: soft, NT, ND, (+)BS Psychiatric: appropriate- very interactive Neurological: Ox3  Extremities:-B/L LE edema 1+ at feet -much better in feet and  calves, but knees, above ACE wrap (that was on yesterday) 2+ edema at knees/just above knees B/L Skin: L calf blister unpopped still  and L base of great toe and top of L foot skin mildly sloughing from popped blisters and R achilles back of distal calf- wound- ~ 2-2.5 inches- more superficial based on pics- has good granulation and scant serous drainage.  Musculoskeletal:        General: Swelling present.     Cervical back: Normal range of motion.     Right lower leg: Edema present. Improved    Left lower leg: Edema present. Improved    Comments: Chronic changes to both knees with mild effusions bilaterally.   Skin: Blisters on left foot    General: Skin is warm.     Comments: Right lower extremity wound  measures about 1.5x 4cm in diameter and is generally pink and dry. 4x4 gauze, kerlix, ACE wrap in place with PRAFO. Some redness along right lateral leg which appears mostly due to external rotation of leg/contact with bed. Old knee incision from ACL repair right.   Neurological:     Mental Status: He is alert.     Comments: Alert and oriented x 3. Normal insight and awareness. Intact Memory. Normal language and speech. Cranial nerve exam unremarkable except for dysconjugate gaze. MMT: BUE 4 to 4+/5. RLE 2-3/5 HF, KE and 4-/5 ADF/PF. LLE 3/5 HF, KE and 4+/5 ADF/PF. Sensory exam normal for light touch and pain in all 4 limbs. No limb ataxia or cerebellar signs. No abnormal tone appreciated.  Stable 4/23 Psychiatric:        Mood and Affect: Mood normal.        Behavior: Behavior normal.   Assessment/Plan: 1. Functional deficits which require 3+ hours per day of interdisciplinary therapy in a comprehensive inpatient rehab setting. Physiatrist is providing close team supervision and 24 hour management of active medical problems listed below. Physiatrist and rehab team continue to assess barriers to discharge/monitor patient progress toward functional and medical  goals  Care Tool:  Bathing    Body parts bathed by patient: Right arm, Left arm, Chest, Abdomen, Front perineal area, Right upper leg, Left upper leg, Right lower leg, Left lower leg, Face, Buttocks   Body parts bathed by helper: Buttocks     Bathing assist Assist Level: Set up assist     Upper Body Dressing/Undressing Upper body dressing   What is the patient wearing?: Pull over shirt    Upper body assist Assist Level: Independent with assistive device    Lower Body Dressing/Undressing Lower body dressing      What is the patient wearing?: Pants, Incontinence brief     Lower body assist Assist for lower body dressing: Independent with assitive device     Toileting Toileting    Toileting assist Assist for toileting:  Independent with assistive device     Transfers Chair/bed transfer  Transfers assist     Chair/bed transfer assist level: Independent with assistive device Chair/bed transfer assistive device: Other (rollator)   Locomotion Ambulation   Ambulation assist      Assist level: Independent with assistive device Assistive device: Rollator Max distance: >329ft   Walk 10 feet activity   Assist     Assist level: Independent with assistive device Assistive device: Rollator   Walk 50 feet activity   Assist Walk 50 feet with 2 turns activity did not occur: Safety/medical concerns  Assist level: Independent with assistive device Assistive device: Rollator    Walk 150 feet activity   Assist Walk 150 feet activity did not occur: Safety/medical concerns  Assist level: Independent with assistive device Assistive device: Rollator    Walk 10 feet on uneven surface  activity   Assist Walk 10 feet on uneven surfaces activity did not occur: Safety/medical concerns   Assist level: Independent with assistive device Assistive device: Rollator   Wheelchair     Assist Is the patient using a wheelchair?: No Type of Wheelchair: Manual Wheelchair activity did not occur: N/A  Wheelchair assist level: Supervision/Verbal cueing Max wheelchair distance: 150 ft    Wheelchair 50 feet with 2 turns activity    Assist    Wheelchair 50 feet with 2 turns activity did not occur: N/A   Assist Level: Supervision/Verbal cueing   Wheelchair 150 feet activity     Assist  Wheelchair 150 feet activity did not occur: N/A   Assist Level: Supervision/Verbal cueing   Blood pressure 132/76, pulse (!) 53, temperature 97.8 F (36.6 C), resp. rate 17, height 6' (1.829 m), weight 96.2 kg, SpO2 97%.  Medical Problem List and Plan: 1. 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  Antithrombotics: -DVT/anticoagulation:  Mechanical: Antiembolism stockings, thigh (TED hose) Bilateral lower extremities             -antiplatelet therapy: continue Plavix  75 mg daily  3. Knee OA: continue Voltaren  gel 4 times daily oxycodone  as needed, tramadol  as needed, messaged ortho tech to please take knee sleeves back  4. Mood/Behavior/Sleep: Provide emotional support             -antipsychotic agents: N/A 5. Neuropsych/cognition: This patient is capable  of making decisions on his own behalf. 6. Skin/Wound Care:   -4 x 4 gauze dampened with Vashe to the right Achilles ulcer with kerlix and Ace wrap from the metatarsal heads up to the tubercle.   -PRAFO right foot -I reapplied the dressing today 4/19- Wound looks great- granulating well- with scant serous drainage.  7. Fluids/Electrolytes/Nutrition: Routine INO's with follow-up chemistries 8.  Left knee pain.  Lower extremity Dopplers negative.  X-ray showed chronic arthritis.  Trial of colchicine .  Uric acid normal limits 9.  Hyponatremia/SIADH.  Fluid restriction.    4/14 stable Na 136  4/19- Latest Na 139 10.  ID/bacteremia.  Was on combination of IV penicillin  and Zyvox  stopped 06/21/2023 changed to p.o. amoxicillin  with stop date 07/04/2023  4/20- last day today 11.  Atrial fibrillation.  Rate controlled with oral beta-blocker/Toprol -XL 25 mg every 12 hours.Librexa AF trial drug (comparing Eliquis  versus milvexian-factor XI inhibitor) 12.  New diagnosis of diabetes mellitus-2.  A1c 6.4.  Currently on SSI.  Plan metformin  on discharge.             -reasonable control at present  CBG (last 3)  No results for input(s): "GLUCAP" in the last 72 hours.  4/19- not checking CBGs since 4/13   13.  Hypertension.  Continue metoprolol .   Hydrochlorothiazide  restarted    07/07/2023    5:25 AM 07/06/2023    6:21 PM 07/06/2023    3:38 PM  Vitals with BMI  Systolic 132 137 782  Diastolic 76 78 75  Pulse 53 74 70   14.  History of prostate cancer. continue Pro scar 5 mg daily 15.  Hyperlipidemia.  Conitnue Zetia /Crestor   16.  Atelectasis: incentive spirometer ordered  17.  Hiccups.  Appear to have resolved, d/c baclofen   18. Overweight: provide dietary education  19. Vitamin D  insufficiency: start D3 daily, continue  20. Transaminitis: d/c tylenol   21. Bowel incontinence: may be 2/2 colchicine    22. Lower extremity edema: lasix  20mg  ordered x1 on 4/16, 40 ordered on 4/17, 20mg  lasix  ordered 4/18  Lasix  20mg  ordered 4/21 and 4/22, will ask PA to send patient home with prn Lasix   23. Nocturia: d/c flomax  given blisters on left foot in case these are side effect of the medication  24. HTN: resolved, 20mg  lasix  ordered 4/18 and 4/21, lasix  20 ordered on 4/22  26. Loose stool: avoid laxatives, last BM 4/21  27. Blisters on left foot/calf:  Topical manuka honey ordered for healing, continue  28. Bradycardia: continue to monitor outpatient   >30 minutes spent in discharge of patient including review of medications and follow-up appointments, physical examination, and in answering all patient's questions    LOS: 12 days A FACE TO FACE EVALUATION WAS PERFORMED  Logan French 07/07/2023, 8:51 AM

## 2023-07-12 DIAGNOSIS — I87311 Chronic venous hypertension (idiopathic) with ulcer of right lower extremity: Secondary | ICD-10-CM | POA: Diagnosis not present

## 2023-07-12 DIAGNOSIS — R7302 Impaired glucose tolerance (oral): Secondary | ICD-10-CM | POA: Diagnosis not present

## 2023-07-12 DIAGNOSIS — D649 Anemia, unspecified: Secondary | ICD-10-CM | POA: Diagnosis not present

## 2023-07-12 DIAGNOSIS — R531 Weakness: Secondary | ICD-10-CM | POA: Diagnosis not present

## 2023-07-12 DIAGNOSIS — R7881 Bacteremia: Secondary | ICD-10-CM | POA: Diagnosis not present

## 2023-07-12 DIAGNOSIS — E871 Hypo-osmolality and hyponatremia: Secondary | ICD-10-CM | POA: Diagnosis not present

## 2023-07-12 DIAGNOSIS — R7401 Elevation of levels of liver transaminase levels: Secondary | ICD-10-CM | POA: Diagnosis not present

## 2023-07-12 DIAGNOSIS — I739 Peripheral vascular disease, unspecified: Secondary | ICD-10-CM | POA: Diagnosis not present

## 2023-07-12 DIAGNOSIS — E785 Hyperlipidemia, unspecified: Secondary | ICD-10-CM | POA: Diagnosis not present

## 2023-07-12 DIAGNOSIS — I10 Essential (primary) hypertension: Secondary | ICD-10-CM | POA: Diagnosis not present

## 2023-07-12 DIAGNOSIS — L97919 Non-pressure chronic ulcer of unspecified part of right lower leg with unspecified severity: Secondary | ICD-10-CM | POA: Diagnosis not present

## 2023-07-12 DIAGNOSIS — R600 Localized edema: Secondary | ICD-10-CM | POA: Diagnosis not present

## 2023-07-13 ENCOUNTER — Ambulatory Visit (HOSPITAL_BASED_OUTPATIENT_CLINIC_OR_DEPARTMENT_OTHER): Admitting: General Surgery

## 2023-07-15 ENCOUNTER — Ambulatory Visit (INDEPENDENT_AMBULATORY_CARE_PROVIDER_SITE_OTHER): Admitting: Orthopedic Surgery

## 2023-07-15 ENCOUNTER — Ambulatory Visit: Admitting: Orthopedic Surgery

## 2023-07-15 ENCOUNTER — Inpatient Hospital Stay (HOSPITAL_COMMUNITY)
Admission: EM | Admit: 2023-07-15 | Discharge: 2023-07-27 | DRG: 853 | Disposition: A | Discharge status: Rehabilitation Facility | Attending: Internal Medicine | Admitting: Internal Medicine

## 2023-07-15 ENCOUNTER — Other Ambulatory Visit: Payer: Self-pay

## 2023-07-15 ENCOUNTER — Emergency Department (HOSPITAL_COMMUNITY)

## 2023-07-15 ENCOUNTER — Encounter (HOSPITAL_COMMUNITY): Payer: Self-pay

## 2023-07-15 DIAGNOSIS — E11621 Type 2 diabetes mellitus with foot ulcer: Secondary | ICD-10-CM | POA: Diagnosis present

## 2023-07-15 DIAGNOSIS — N400 Enlarged prostate without lower urinary tract symptoms: Secondary | ICD-10-CM | POA: Diagnosis not present

## 2023-07-15 DIAGNOSIS — L97529 Non-pressure chronic ulcer of other part of left foot with unspecified severity: Secondary | ICD-10-CM | POA: Diagnosis not present

## 2023-07-15 DIAGNOSIS — I739 Peripheral vascular disease, unspecified: Secondary | ICD-10-CM

## 2023-07-15 DIAGNOSIS — Z7902 Long term (current) use of antithrombotics/antiplatelets: Secondary | ICD-10-CM

## 2023-07-15 DIAGNOSIS — I482 Chronic atrial fibrillation, unspecified: Secondary | ICD-10-CM

## 2023-07-15 DIAGNOSIS — L0390 Cellulitis, unspecified: Principal | ICD-10-CM | POA: Diagnosis present

## 2023-07-15 DIAGNOSIS — L03116 Cellulitis of left lower limb: Secondary | ICD-10-CM | POA: Diagnosis present

## 2023-07-15 DIAGNOSIS — Z7901 Long term (current) use of anticoagulants: Secondary | ICD-10-CM | POA: Diagnosis not present

## 2023-07-15 DIAGNOSIS — Z7409 Other reduced mobility: Secondary | ICD-10-CM | POA: Diagnosis not present

## 2023-07-15 DIAGNOSIS — I70202 Unspecified atherosclerosis of native arteries of extremities, left leg: Secondary | ICD-10-CM | POA: Diagnosis not present

## 2023-07-15 DIAGNOSIS — I70245 Atherosclerosis of native arteries of left leg with ulceration of other part of foot: Secondary | ICD-10-CM | POA: Diagnosis not present

## 2023-07-15 DIAGNOSIS — R609 Edema, unspecified: Secondary | ICD-10-CM | POA: Diagnosis not present

## 2023-07-15 DIAGNOSIS — L539 Erythematous condition, unspecified: Secondary | ICD-10-CM | POA: Diagnosis not present

## 2023-07-15 DIAGNOSIS — Z7184 Encounter for health counseling related to travel: Secondary | ICD-10-CM

## 2023-07-15 DIAGNOSIS — S91302A Unspecified open wound, left foot, initial encounter: Secondary | ICD-10-CM | POA: Diagnosis not present

## 2023-07-15 DIAGNOSIS — N179 Acute kidney failure, unspecified: Secondary | ICD-10-CM | POA: Diagnosis present

## 2023-07-15 DIAGNOSIS — Z79899 Other long term (current) drug therapy: Secondary | ICD-10-CM | POA: Diagnosis not present

## 2023-07-15 DIAGNOSIS — R6520 Severe sepsis without septic shock: Secondary | ICD-10-CM | POA: Diagnosis not present

## 2023-07-15 DIAGNOSIS — R509 Fever, unspecified: Secondary | ICD-10-CM | POA: Diagnosis not present

## 2023-07-15 DIAGNOSIS — I87331 Chronic venous hypertension (idiopathic) with ulcer and inflammation of right lower extremity: Secondary | ICD-10-CM | POA: Diagnosis not present

## 2023-07-15 DIAGNOSIS — E785 Hyperlipidemia, unspecified: Secondary | ICD-10-CM

## 2023-07-15 DIAGNOSIS — X58XXXA Exposure to other specified factors, initial encounter: Secondary | ICD-10-CM | POA: Diagnosis present

## 2023-07-15 DIAGNOSIS — L03115 Cellulitis of right lower limb: Secondary | ICD-10-CM | POA: Diagnosis present

## 2023-07-15 DIAGNOSIS — M109 Gout, unspecified: Secondary | ICD-10-CM | POA: Diagnosis not present

## 2023-07-15 DIAGNOSIS — Z7984 Long term (current) use of oral hypoglycemic drugs: Secondary | ICD-10-CM

## 2023-07-15 DIAGNOSIS — A419 Sepsis, unspecified organism: Principal | ICD-10-CM | POA: Diagnosis present

## 2023-07-15 DIAGNOSIS — Z9889 Other specified postprocedural states: Secondary | ICD-10-CM | POA: Diagnosis not present

## 2023-07-15 DIAGNOSIS — M1712 Unilateral primary osteoarthritis, left knee: Secondary | ICD-10-CM | POA: Diagnosis not present

## 2023-07-15 DIAGNOSIS — E872 Acidosis, unspecified: Secondary | ICD-10-CM | POA: Diagnosis present

## 2023-07-15 DIAGNOSIS — E1151 Type 2 diabetes mellitus with diabetic peripheral angiopathy without gangrene: Secondary | ICD-10-CM | POA: Diagnosis present

## 2023-07-15 DIAGNOSIS — R7989 Other specified abnormal findings of blood chemistry: Secondary | ICD-10-CM | POA: Diagnosis not present

## 2023-07-15 DIAGNOSIS — R1032 Left lower quadrant pain: Secondary | ICD-10-CM | POA: Diagnosis not present

## 2023-07-15 DIAGNOSIS — R5381 Other malaise: Secondary | ICD-10-CM | POA: Diagnosis not present

## 2023-07-15 DIAGNOSIS — E119 Type 2 diabetes mellitus without complications: Secondary | ICD-10-CM | POA: Diagnosis not present

## 2023-07-15 DIAGNOSIS — R5383 Other fatigue: Secondary | ICD-10-CM | POA: Diagnosis not present

## 2023-07-15 DIAGNOSIS — Z794 Long term (current) use of insulin: Secondary | ICD-10-CM | POA: Diagnosis not present

## 2023-07-15 DIAGNOSIS — I10 Essential (primary) hypertension: Secondary | ICD-10-CM

## 2023-07-15 DIAGNOSIS — S90822A Blister (nonthermal), left foot, initial encounter: Secondary | ICD-10-CM | POA: Diagnosis not present

## 2023-07-15 DIAGNOSIS — I70242 Atherosclerosis of native arteries of left leg with ulceration of calf: Secondary | ICD-10-CM | POA: Diagnosis not present

## 2023-07-15 DIAGNOSIS — D72829 Elevated white blood cell count, unspecified: Secondary | ICD-10-CM | POA: Diagnosis not present

## 2023-07-15 DIAGNOSIS — Z860100 Personal history of colon polyps, unspecified: Secondary | ICD-10-CM

## 2023-07-15 DIAGNOSIS — M7989 Other specified soft tissue disorders: Secondary | ICD-10-CM | POA: Diagnosis not present

## 2023-07-15 DIAGNOSIS — R0682 Tachypnea, not elsewhere classified: Secondary | ICD-10-CM | POA: Diagnosis not present

## 2023-07-15 DIAGNOSIS — R4182 Altered mental status, unspecified: Secondary | ICD-10-CM | POA: Diagnosis not present

## 2023-07-15 DIAGNOSIS — Z4800 Encounter for change or removal of nonsurgical wound dressing: Secondary | ICD-10-CM | POA: Diagnosis not present

## 2023-07-15 DIAGNOSIS — G9341 Metabolic encephalopathy: Secondary | ICD-10-CM | POA: Diagnosis present

## 2023-07-15 DIAGNOSIS — R531 Weakness: Secondary | ICD-10-CM | POA: Diagnosis not present

## 2023-07-15 DIAGNOSIS — E871 Hypo-osmolality and hyponatremia: Secondary | ICD-10-CM | POA: Diagnosis not present

## 2023-07-15 DIAGNOSIS — D510 Vitamin B12 deficiency anemia due to intrinsic factor deficiency: Secondary | ICD-10-CM | POA: Diagnosis not present

## 2023-07-15 DIAGNOSIS — R0989 Other specified symptoms and signs involving the circulatory and respiratory systems: Secondary | ICD-10-CM | POA: Diagnosis not present

## 2023-07-15 DIAGNOSIS — L97521 Non-pressure chronic ulcer of other part of left foot limited to breakdown of skin: Secondary | ICD-10-CM | POA: Diagnosis not present

## 2023-07-15 DIAGNOSIS — Z8546 Personal history of malignant neoplasm of prostate: Secondary | ICD-10-CM

## 2023-07-15 DIAGNOSIS — I4891 Unspecified atrial fibrillation: Secondary | ICD-10-CM | POA: Diagnosis not present

## 2023-07-15 LAB — I-STAT CG4 LACTIC ACID, ED
Lactic Acid, Venous: 3.7 mmol/L (ref 0.5–1.9)
Lactic Acid, Venous: 3.6 mmol/L (ref 0.5–1.9)

## 2023-07-15 LAB — URINALYSIS, ROUTINE W REFLEX MICROSCOPIC
Bilirubin Urine: NEGATIVE
Glucose, UA: NEGATIVE mg/dL
Ketones, ur: NEGATIVE mg/dL
Leukocytes,Ua: NEGATIVE
Nitrite: NEGATIVE
Protein, ur: 30 mg/dL — AB
Specific Gravity, Urine: 1.021 (ref 1.005–1.030)
pH: 5 (ref 5.0–8.0)

## 2023-07-15 LAB — COMPREHENSIVE METABOLIC PANEL WITH GFR
ALT: 26 U/L (ref 0–44)
AST: 32 U/L (ref 15–41)
Albumin: 2.8 g/dL — ABNORMAL LOW (ref 3.5–5.0)
Alkaline Phosphatase: 82 U/L (ref 38–126)
Anion gap: 12 (ref 5–15)
BUN: 20 mg/dL (ref 8–23)
CO2: 23 mmol/L (ref 22–32)
Calcium: 8.3 mg/dL — ABNORMAL LOW (ref 8.9–10.3)
Chloride: 97 mmol/L — ABNORMAL LOW (ref 98–111)
Creatinine, Ser: 1.26 mg/dL — ABNORMAL HIGH (ref 0.61–1.24)
GFR, Estimated: 56 mL/min — ABNORMAL LOW (ref >60)
Glucose, Bld: 151 mg/dL — ABNORMAL HIGH (ref 70–99)
Potassium: 3.6 mmol/L (ref 3.5–5.1)
Sodium: 132 mmol/L — ABNORMAL LOW (ref 135–145)
Total Bilirubin: 1.4 mg/dL — ABNORMAL HIGH (ref 0.0–1.2)
Total Protein: 6.1 g/dL — ABNORMAL LOW (ref 6.5–8.1)

## 2023-07-15 LAB — CBC WITH DIFFERENTIAL/PLATELET
Abs Immature Granulocytes: 0.1 10*3/uL — ABNORMAL HIGH (ref 0.00–0.07)
Basophils Absolute: 0 10*3/uL (ref 0.0–0.1)
Basophils Relative: 0 %
Eosinophils Absolute: 0 10*3/uL (ref 0.0–0.5)
Eosinophils Relative: 0 %
HCT: 36.3 % — ABNORMAL LOW (ref 39.0–52.0)
Hemoglobin: 11.7 g/dL — ABNORMAL LOW (ref 13.0–17.0)
Immature Granulocytes: 1 %
Lymphocytes Relative: 4 %
Lymphs Abs: 0.7 10*3/uL (ref 0.7–4.0)
MCH: 28.5 pg (ref 26.0–34.0)
MCHC: 32.2 g/dL (ref 30.0–36.0)
MCV: 88.5 fL (ref 80.0–100.0)
Monocytes Absolute: 0.8 10*3/uL (ref 0.1–1.0)
Monocytes Relative: 5 %
Neutro Abs: 13.7 10*3/uL — ABNORMAL HIGH (ref 1.7–7.7)
Neutrophils Relative %: 90 %
Platelets: 212 10*3/uL (ref 150–400)
RBC: 4.1 MIL/uL — ABNORMAL LOW (ref 4.22–5.81)
RDW: 17.5 % — ABNORMAL HIGH (ref 11.5–15.5)
WBC: 15.3 10*3/uL — ABNORMAL HIGH (ref 4.0–10.5)
nRBC: 0 % (ref 0.0–0.2)

## 2023-07-15 LAB — PHOSPHORUS: Phosphorus: 2.3 mg/dL — ABNORMAL LOW (ref 2.5–4.6)

## 2023-07-15 LAB — CBG MONITORING, ED: Glucose-Capillary: 134 mg/dL — ABNORMAL HIGH (ref 70–99)

## 2023-07-15 LAB — CK: Total CK: 54 U/L (ref 49–397)

## 2023-07-15 MED ORDER — METRONIDAZOLE 500 MG/100ML IV SOLN
500.0000 mg | Freq: Once | INTRAVENOUS | Status: AC
  Administered 2023-07-15: 500 mg via INTRAVENOUS
  Filled 2023-07-15: qty 100

## 2023-07-15 MED ORDER — ACETAMINOPHEN 500 MG PO TABS
1000.0000 mg | ORAL_TABLET | Freq: Once | ORAL | Status: AC
  Administered 2023-07-15: 1000 mg via ORAL
  Filled 2023-07-15: qty 2

## 2023-07-15 MED ORDER — LACTATED RINGERS IV BOLUS
1000.0000 mL | Freq: Once | INTRAVENOUS | Status: AC
  Administered 2023-07-15: 1000 mL via INTRAVENOUS

## 2023-07-15 MED ORDER — APIXABAN 5 MG PO TABS
5.0000 mg | ORAL_TABLET | Freq: Two times a day (BID) | ORAL | Status: DC
  Administered 2023-07-15 – 2023-07-19 (×7): 5 mg via ORAL
  Filled 2023-07-15 (×7): qty 1

## 2023-07-15 MED ORDER — ACETAMINOPHEN 650 MG RE SUPP
650.0000 mg | Freq: Four times a day (QID) | RECTAL | Status: DC | PRN
  Filled 2023-07-15: qty 1

## 2023-07-15 MED ORDER — POLYETHYLENE GLYCOL 3350 17 G PO PACK
17.0000 g | PACK | Freq: Every day | ORAL | Status: DC | PRN
  Administered 2023-07-23: 17 g via ORAL
  Filled 2023-07-15: qty 1

## 2023-07-15 MED ORDER — CLOPIDOGREL BISULFATE 75 MG PO TABS
75.0000 mg | ORAL_TABLET | Freq: Every day | ORAL | Status: DC
  Administered 2023-07-16 – 2023-07-20 (×5): 75 mg via ORAL
  Filled 2023-07-15 (×5): qty 1

## 2023-07-15 MED ORDER — FINASTERIDE 5 MG PO TABS
5.0000 mg | ORAL_TABLET | Freq: Every day | ORAL | Status: DC
  Administered 2023-07-16 – 2023-07-27 (×12): 5 mg via ORAL
  Filled 2023-07-15 (×12): qty 1

## 2023-07-15 MED ORDER — EZETIMIBE 10 MG PO TABS
10.0000 mg | ORAL_TABLET | Freq: Every day | ORAL | Status: DC
  Administered 2023-07-16 – 2023-07-27 (×12): 10 mg via ORAL
  Filled 2023-07-15 (×12): qty 1

## 2023-07-15 MED ORDER — SODIUM CHLORIDE 0.9 % IV SOLN
2.0000 g | Freq: Once | INTRAVENOUS | Status: AC
  Administered 2023-07-15: 2 g via INTRAVENOUS
  Filled 2023-07-15: qty 12.5

## 2023-07-15 MED ORDER — PIPERACILLIN-TAZOBACTAM 3.375 G IVPB
3.3750 g | Freq: Three times a day (TID) | INTRAVENOUS | Status: DC
  Administered 2023-07-16: 3.375 g via INTRAVENOUS
  Filled 2023-07-15: qty 50

## 2023-07-15 MED ORDER — OXYCODONE HCL 5 MG PO TABS
5.0000 mg | ORAL_TABLET | Freq: Four times a day (QID) | ORAL | Status: DC | PRN
  Administered 2023-07-16 – 2023-07-27 (×18): 5 mg via ORAL
  Filled 2023-07-15 (×18): qty 1

## 2023-07-15 MED ORDER — INSULIN ASPART 100 UNIT/ML IJ SOLN
0.0000 [IU] | Freq: Three times a day (TID) | INTRAMUSCULAR | Status: DC
Start: 2023-07-16 — End: 2023-07-27
  Administered 2023-07-16: 2 [IU] via SUBCUTANEOUS
  Administered 2023-07-16 – 2023-07-17 (×2): 1 [IU] via SUBCUTANEOUS
  Administered 2023-07-18: 2 [IU] via SUBCUTANEOUS
  Administered 2023-07-18 – 2023-07-22 (×8): 1 [IU] via SUBCUTANEOUS
  Administered 2023-07-23: 2 [IU] via SUBCUTANEOUS
  Administered 2023-07-24 – 2023-07-25 (×4): 1 [IU] via SUBCUTANEOUS
  Administered 2023-07-26: 2 [IU] via SUBCUTANEOUS
  Administered 2023-07-26 – 2023-07-27 (×3): 1 [IU] via SUBCUTANEOUS
  Filled 2023-07-15: qty 0.09

## 2023-07-15 MED ORDER — PANTOPRAZOLE SODIUM 40 MG PO TBEC
40.0000 mg | DELAYED_RELEASE_TABLET | Freq: Every day | ORAL | Status: DC
  Administered 2023-07-16 – 2023-07-27 (×12): 40 mg via ORAL
  Filled 2023-07-15 (×12): qty 1

## 2023-07-15 MED ORDER — VANCOMYCIN HCL IN DEXTROSE 1-5 GM/200ML-% IV SOLN
1000.0000 mg | INTRAVENOUS | Status: AC
  Administered 2023-07-15: 1000 mg via INTRAVENOUS
  Filled 2023-07-15: qty 200

## 2023-07-15 MED ORDER — SODIUM CHLORIDE 0.9% FLUSH
3.0000 mL | Freq: Two times a day (BID) | INTRAVENOUS | Status: DC
  Administered 2023-07-15 – 2023-07-21 (×12): 3 mL via INTRAVENOUS

## 2023-07-15 MED ORDER — VANCOMYCIN HCL 1500 MG/300ML IV SOLN
1500.0000 mg | INTRAVENOUS | Status: DC
  Administered 2023-07-16 – 2023-07-21 (×6): 1500 mg via INTRAVENOUS
  Filled 2023-07-15 (×7): qty 300

## 2023-07-15 MED ORDER — LACTATED RINGERS IV SOLN: INTRAVENOUS | Status: AC

## 2023-07-15 MED ORDER — SENNOSIDES-DOCUSATE SODIUM 8.6-50 MG PO TABS
2.0000 | ORAL_TABLET | Freq: Two times a day (BID) | ORAL | Status: DC
  Administered 2023-07-15 – 2023-07-27 (×23): 2 via ORAL
  Filled 2023-07-15 (×24): qty 2

## 2023-07-15 MED ORDER — LACTATED RINGERS IV BOLUS (SEPSIS)
1000.0000 mL | Freq: Once | INTRAVENOUS | Status: AC
  Administered 2023-07-15: 1000 mL via INTRAVENOUS

## 2023-07-15 MED ORDER — ACETAMINOPHEN 325 MG PO TABS
650.0000 mg | ORAL_TABLET | Freq: Four times a day (QID) | ORAL | Status: DC | PRN
  Administered 2023-07-16 – 2023-07-20 (×4): 650 mg via ORAL
  Filled 2023-07-15 (×4): qty 2

## 2023-07-15 MED ORDER — ROSUVASTATIN CALCIUM 20 MG PO TABS
20.0000 mg | ORAL_TABLET | Freq: Every day | ORAL | Status: DC
  Administered 2023-07-16 – 2023-07-27 (×12): 20 mg via ORAL
  Filled 2023-07-15 (×12): qty 1

## 2023-07-15 MED ORDER — VANCOMYCIN HCL IN DEXTROSE 1-5 GM/200ML-% IV SOLN
1000.0000 mg | Freq: Once | INTRAVENOUS | Status: AC
  Administered 2023-07-15: 1000 mg via INTRAVENOUS
  Filled 2023-07-15: qty 200

## 2023-07-15 MED ORDER — INSULIN ASPART 100 UNIT/ML IJ SOLN
0.0000 [IU] | Freq: Every day | INTRAMUSCULAR | Status: DC
  Filled 2023-07-15: qty 0.05

## 2023-07-15 NOTE — Assessment & Plan Note (Signed)
 C/w finasteride 

## 2023-07-15 NOTE — Progress Notes (Signed)
 Pharmacy Antibiotic Note  Logan French is a 89 y.o. male admitted on 07/15/2023 with  wound infection .  Pharmacy has been consulted for Vanco, Zosyn  dosing.  Active Problem(s): AMS, fatigue, L foot wound, fever - recently had revascularization of the right lower extremity with chronic wound. He had a subsequent admission for cellulitis.  - Seen as OP today and sent to ED with new wounds on L foot. - Wife thinks compliant with his medications, but does admit that there was antibiotic she believes he was discharged home with that he did not take.   ID: Wound infection - MRSA which was isolated from previous wound culture (2017?). Group A strep bacteremia 06/17/23. - Temp 102.6, WBC 15.3, LA 3.7  Cefepime  5/1 x 1 Flagyl  5/1 x 1 Vanco 5/1>> Zosyn  5/2>>  Plan: Cefepime  2g + Flagyl  500mg  in ED x 1 Zosyn  3.375g IV q8hr, start 5/2 AM Vanco 1g IV + another 1g ordered = 2g load Vancomycin  1500 mg IV Q 24 hrs. Goal AUC 400-550. Expected AUC: 506 SCr used: 1.26     Temp (24hrs), Avg:102.6 F (39.2 C), Min:102.6 F (39.2 C), Max:102.6 F (39.2 C)  Recent Labs  Lab 07/15/23 1829 07/15/23 1835  WBC 15.3*  --   CREATININE 1.26*  --   LATICACIDVEN  --  3.7*    Estimated Creatinine Clearance: 50.6 mL/min (A) (by C-G formula based on SCr of 1.26 mg/dL (H)).    No Known Allergies  Logan French S. Logan French, PharmD, BCPS Clinical Staff Pharmacist  Logan French 07/15/2023 9:08 PM

## 2023-07-15 NOTE — Assessment & Plan Note (Signed)
 At this time do not suspect an abscess.  Per report, patient had Doppler pulses on the left foot.  Therefore critical limb ischemia is not suspected.  S/p vancomycin  cefepime  and metronidazole , will continue treatment with vancomycin  plus Zosyn .  Blood cultures are pending.

## 2023-07-15 NOTE — ED Notes (Signed)
 [facility] called and set-up for transfer.

## 2023-07-15 NOTE — Consult Note (Signed)
 [facility] [place]  ASSESSMENT / PLAN: Logan French is a 89 y.o. male with atherosclerosis of  native arteries of left lower extremity causing ulceration.  Recommend:  Abstinence from all tobacco products. Blood glucose control with goal A1c < 7%. Blood pressure control with goal blood pressure < 140/90 mmHg. Lipid reduction therapy with goal LDL-C <100 mg/dL Aspirin  81mg  PO QD.  Clopidogrel  75 mg by mouth daily Atorvastatin 40-80mg  PO QD (or other "high intensity" statin therapy).  AKI improving.  Tentative plan for left lower extremity angiogram with possible intervention in Cath Lab Monday.  CHIEF COMPLAINT: Left foot ulcer, altered mental status  HISTORY OF PRESENT ILLNESS: Logan French is a 89 y.o. male admitted to the hospital for altered mental status in setting of left leg cellulitis and ulceration.  The patient is well-known to our service, having undergone recent endovascular intervention with Dr. Susi Eric 05/17/2023 for right lower extremity chronic limb threatening ischemia with wound and rest pain.  This has improved significantly since intervention.  Unfortunately, the patient has developed multifocal ulceration of the left foot.  The patient presented to Dr. Aniceto Kern office yesterday.  Was found to be mildly confused with a cellulitic leg.  He was instructed to present to the emergency department.  In the emergency department workup, he is found to have altered mental status, lactic acidosis, and mild acute kidney injury.  Vascular consultation was requested.  The patient is much improved this morning on my evaluation during morning rounds.  He is bright and conversant.  He feels much better.  I offered him angiogram today, but the patient preferred to wait until Monday to allow himself to continue to improve.  Past Medical History:  Diagnosis Date   A-fib (HCC)    B12 deficiency    Diverticulosis    Fatigue    History of colonic polyps     Hyperlipemia    Hypertension    Pernicious anemia    Prostate cancer (HCC)    Syncope    Syncope     Past Surgical History:  Procedure Laterality Date   ABDOMINAL AORTOGRAM W/LOWER EXTREMITY N/A 05/17/2023   Procedure: ABDOMINAL AORTOGRAM W/LOWER EXTREMITY;  Surgeon: Philipp Brawn, MD;  [facility] INVASIVE CV LAB;  Service: Cardiovascular;  Laterality: N/A;   CARDIOVERSION N/A 10/07/2021   Procedure: CARDIOVERSION;  Surgeon: Jerryl Morin, DO;  Location: [facility] ENDOSCOPY;  Service: Cardiovascular;  Laterality: N/A;   IR ANGIO INTRA EXTRACRAN SEL COM CAROTID INNOMINATE BILAT MOD SED  09/10/2017   IR ANGIO VERTEBRAL SEL SUBCLAVIAN INNOMINATE BILAT MOD SED  09/10/2017   PERIPHERAL INTRAVASCULAR LITHOTRIPSY Right 05/17/2023   Procedure: PERIPHERAL INTRAVASCULAR LITHOTRIPSY;  Surgeon: Philipp Brawn, MD;  Location: [facility] INVASIVE CV LAB;  Service: Cardiovascular;  Laterality: Right;  SFA-POP   PERIPHERAL VASCULAR BALLOON ANGIOPLASTY Right 05/17/2023   Procedure: PERIPHERAL VASCULAR BALLOON ANGIOPLASTY;  Surgeon: Philipp Brawn, MD;  Location: [facility] INVASIVE CV LAB;  Service: Cardiovascular;  Laterality: Right;  SFA-POP    History reviewed. No pertinent family history.  Social History   Socioeconomic History   Marital status: Married    Spouse name: Not on file   Number of children: Not on file   Years of education: Not on file   Highest education level: Not on file  Occupational History   Not on file  Tobacco Use   Smoking status: Never   Smokeless tobacco: Never   Tobacco comments:    Never smoke  09/23/21  Vaping Use   Vaping status: Never Used  Substance and Sexual Activity   Alcohol  use: Yes    Alcohol /week: 2.0 standard drinks of alcohol     Types: 1 Cans of beer, 1 Shots of liquor per week    Comment: occasionally   Drug use: No   Sexual activity: Not on file  Other Topics Concern   Not on file  Social History Narrative   Lives with wife   Right Handed   Drinks 1-2 cups daily    Social Drivers of Health   Financial Resource Strain: Not on file  Food Insecurity: No Food Insecurity (07/16/2023)   Hunger Vital Sign    Worried About Running Out of Food in the Last Year: Never true    Ran Out of Food in the Last Year: Never true  Transportation Needs: No Transportation Needs (07/16/2023)   PRAPARE - Administrator, Civil Service (Medical): No    Lack of Transportation (Non-Medical): No  Physical Activity: Not on file  Stress: Not on file  Social Connections: Socially Integrated (07/16/2023)   Social Connection and Isolation Panel [NHANES]    Frequency of Communication with Friends and Family: Twice a week    Frequency of Social Gatherings with Friends and Family: Twice a week    Attends Religious Services: 1 to 4 times per year    Active Member of [employer] or Organizations: Yes    Attends Banker Meetings: Never    Marital Status: Married  Catering manager Violence: Not At Risk (07/16/2023)   Humiliation, Afraid, Rape, and Kick questionnaire    Fear of Current or Ex-Partner: No    Emotionally Abused: No    Physically Abused: No    Sexually Abused: No    No Known Allergies  Current Facility-Administered Medications  Medication Dose Route Frequency Provider Last Rate Last Admin   acetaminophen  (TYLENOL ) tablet 650 mg  650 mg Oral Q6H PRN Bennie Brave, MD       Or   acetaminophen  (TYLENOL ) suppository 650 mg  650 mg Rectal Q6H PRN Bennie Brave, MD       apixaban  (ELIQUIS ) tablet 5 mg  5 mg Oral BID Goel, Hersh, MD   5 mg at 07/16/23 0850   cefTRIAXone  (ROCEPHIN ) 2 g in sodium chloride  0.9 % 100 mL IVPB  2 g Intravenous Q24H Haydee Lipa, MD       clopidogrel  (PLAVIX ) tablet 75 mg  75 mg Oral Daily Goel, Hersh, MD   75 mg at 07/16/23 0850   ezetimibe  (ZETIA ) tablet 10 mg  10 mg Oral Daily Goel, Hersh, MD   10 mg at 07/16/23 1610   finasteride  (PROSCAR ) tablet 5 mg  5 mg Oral Daily Goel, Hersh, MD   5 mg at 07/16/23 9604   insulin  aspart  (novoLOG ) injection 0-5 Units  0-5 Units Subcutaneous QHS Bennie Brave, MD       insulin  aspart (novoLOG ) injection 0-9 Units  0-9 Units Subcutaneous TID WC Bennie Brave, MD       lactated ringers  infusion   Intravenous Continuous Flonnie Humphrey, DO 150 mL/hr at 07/16/23 0502 New Bag at 07/16/23 0502   oxyCODONE  (Oxy IR/ROXICODONE ) immediate release tablet 5 mg  5 mg Oral Q6H PRN Bennie Brave, MD       pantoprazole  (PROTONIX ) EC tablet 40 mg  40 mg Oral Daily Goel, Hersh, MD   40 mg at 07/16/23 0850   polyethylene glycol (MIRALAX  / GLYCOLAX ) packet  17 g  17 g Oral Daily PRN Bennie Brave, MD       rosuvastatin  (CRESTOR ) tablet 20 mg  20 mg Oral Daily Bennie Brave, MD   20 mg at 07/16/23 0850   senna-docusate (Senokot-S) tablet 2 tablet  2 tablet Oral BID Bennie Brave, MD   2 tablet at 07/16/23 1610   sodium chloride  flush (NS) 0.9 % injection 3 mL  3 mL Intravenous Q12H Goel, Hersh, MD   3 mL at 07/16/23 0851   vancomycin  (VANCOREADY) IVPB 1500 mg/300 mL  1,500 mg Intravenous Q24H Scheryl Cushing, [place]        PHYSICAL EXAM Vitals:   07/15/23 2343 07/15/23 2349 07/16/23 0406 07/16/23 0743  BP: (!) 97/53 (!) 100/53 (!) 106/58 111/70  Pulse: 71  69 70  Resp: 17  18 17   Temp: 98.5 F (36.9 C)  98 F (36.7 C) 98.5 F (36.9 C)  TempSrc:    Oral  SpO2: 97%  96% 98%  Weight:      Height:       Elderly man in no acute distress Regular rate and rhythm Unlabored breathing 2+ femoral pulses bilaterally No palpable pedal pulses on the left No palp pedal pulses on the right             PERTINENT LABORATORY AND RADIOLOGIC DATA  Most recent CBC    Latest Ref Rng & Units 07/16/2023    9:06 AM 07/15/2023    6:29 PM 07/01/2023    5:04 AM  CBC  WBC 4.0 - 10.5 K/uL 15.6  15.3  6.6   Hemoglobin 13.0 - 17.0 g/dL 96.0  45.4  09.8   Hematocrit 39.0 - 52.0 % 34.6  36.3  36.3   Platelets 150 - 400 K/uL 167  212  398      Most recent CMP    Latest Ref Rng & Units 07/16/2023    9:06 AM  07/15/2023    6:29 PM 07/01/2023    5:04 AM  CMP  Glucose 70 - 99 mg/dL 119  147  829   BUN 8 - 23 mg/dL 18  20  23    Creatinine 0.61 - 1.24 mg/dL 5.62  1.30  8.65   Sodium 135 - 145 mmol/L 133  132  139   Potassium 3.5 - 5.1 mmol/L 3.7  3.6  4.1   Chloride 98 - 111 mmol/L 99  97  105   CO2 22 - 32 mmol/L 21  23  22    Calcium  8.9 - 10.3 mg/dL 8.0  8.3  8.5   Total Protein 6.5 - 8.1 g/dL  6.1  5.3   Total Bilirubin 0.0 - 1.2 mg/dL  1.4  1.3   Alkaline Phos 38 - 126 U/L  82  156   AST 15 - 41 U/L  32  69   ALT 0 - 44 U/L  26  66     Renal function Estimated Creatinine Clearance: 55 mL/min (by C-G formula based on SCr of 1.19 mg/dL).  Hgb A1c MFr Bld (%)  Date Value  06/17/2023 6.4 (H)    LDL Chol Calc (NIH)  Date Value Ref Range Status  07/14/2019 45 0 - 99 mg/dL Final     +-------+----------------+-----------+------------------+------------+  ABI/TBIToday's ABI     Today's TBIPrevious ABI      Previous TBI  +-------+----------------+-----------+------------------+------------+  Right AT and DP patent0.62       0.73 (occluded PT)0.45          +-------+----------------+-----------+------------------+------------+  Left  0.98            0.35       0.85              0.70          +-------+----------------+-----------+------------------+------------+   Absolute toe pressure on the left 41  Logan Obryan N. Edgardo Goodwill, MD FACS [facility] Phone Number: [phone] 07/16/2023 12:05 PM   Total time spent on preparing this encounter including chart review, data review, collecting history, examining the patient, and coordinating care:  inpatient consult - high complexity - 80 minutes (CPT 99255)  Portions of this report may have been transcribed using voice recognition software.  Every effort has been made to ensure accuracy; however, inadvertent computerized transcription errors may still be present.

## 2023-07-15 NOTE — H&P (View-Only) (Signed)
 [facility] [place]  ASSESSMENT / PLAN: [name] is a 89 y.o. male with atherosclerosis of  native arteries of left lower extremity causing ulceration.  Recommend:  Abstinence from all tobacco products. Blood glucose control with goal A1c < 7%. Blood pressure control with goal blood pressure < 140/90 mmHg. Lipid reduction therapy with goal LDL-C <100 mg/dL Aspirin  81mg  PO QD.  Clopidogrel  75 mg by mouth daily Atorvastatin 40-80mg  PO QD (or other "high intensity" statin therapy).  AKI improving.  Tentative plan for left lower extremity angiogram with possible intervention in Cath Lab Monday.  CHIEF COMPLAINT: Left foot ulcer, altered mental status  HISTORY OF PRESENT ILLNESS: Logan French is a 89 y.o. male admitted to the hospital for altered mental status in setting of left leg cellulitis and ulceration.  The patient is well-known to our service, having undergone recent endovascular intervention with Dr. Susi Eric 05/17/2023 for right lower extremity chronic limb threatening ischemia with wound and rest pain.  This has improved significantly since intervention.  Unfortunately, the patient has developed multifocal ulceration of the left foot.  The patient presented to Dr. Aniceto Kern office yesterday.  Was found to be mildly confused with a cellulitic leg.  He was instructed to present to the emergency department.  In the emergency department workup, he is found to have altered mental status, lactic acidosis, and mild acute kidney injury.  Vascular consultation was requested.  The patient is much improved this morning on my evaluation during morning rounds.  He is bright and conversant.  He feels much better.  I offered him angiogram today, but the patient preferred to wait until Monday to allow himself to continue to improve.  Past Medical History:  Diagnosis Date   A-fib (HCC)    B12 deficiency    Diverticulosis    Fatigue    History of colonic polyps     Hyperlipemia    Hypertension    Pernicious anemia    Prostate cancer (HCC)    Syncope    Syncope     Past Surgical History:  Procedure Laterality Date   ABDOMINAL AORTOGRAM W/LOWER EXTREMITY N/A 05/17/2023   Procedure: ABDOMINAL AORTOGRAM W/LOWER EXTREMITY;  Surgeon: Philipp Brawn, MD;  Location[facility] INVASIVE CV LAB;  Service: Cardiovascular;  Laterality: N/A;   CARDIOVERSION N/A 10/07/2021   Procedure: CARDIOVERSION;  Surgeon: Jerryl Morin, DO;  Location: [facility] ENDOSCOPY;  Service: Cardiovascular;  Laterality: N/A;   IR ANGIO INTRA EXTRACRAN SEL COM CAROTID INNOMINATE BILAT MOD SED  09/10/2017   IR ANGIO VERTEBRAL SEL SUBCLAVIAN INNOMINATE BILAT MOD SED  09/10/2017   PERIPHERAL INTRAVASCULAR LITHOTRIPSY Right 05/17/2023   Procedure: PERIPHERAL INTRAVASCULAR LITHOTRIPSY;  Surgeon: Philipp Brawn, MD;  Location: [facility] INVASIVE CV LAB;  Service: Cardiovascular;  Laterality: Right;  SFA-POP   PERIPHERAL VASCULAR BALLOON ANGIOPLASTY Right 05/17/2023   Procedure: PERIPHERAL VASCULAR BALLOON ANGIOPLASTY;  Surgeon: Philipp Brawn, MD;  Location: [facility] INVASIVE CV LAB;  Service: Cardiovascular;  Laterality: Right;  SFA-POP    History reviewed. No pertinent family history.  Social History   Socioeconomic History   Marital status: Married    Spouse name: Not on file   Number of children: Not on file   Years of education: Not on file   Highest education level: Not on file  Occupational History   Not on file  Tobacco Use   Smoking status: Never   Smokeless tobacco: Never   Tobacco comments:    Never smoke  09/23/21  Vaping Use   Vaping status: Never Used  Substance and Sexual Activity   Alcohol  use: Yes    Alcohol /week: 2.0 standard drinks of alcohol     Types: 1 Cans of beer, 1 Shots of liquor per week    Comment: occasionally   Drug use: No   Sexual activity: Not on file  Other Topics Concern   Not on file  Social History Narrative   Lives with wife   Right Handed   Drinks 1-2 cups daily    Social Drivers of Health   Financial Resource Strain: Not on file  Food Insecurity: No Food Insecurity (07/16/2023)   Hunger Vital Sign    Worried About Running Out of Food in the Last Year: Never true    Ran Out of Food in the Last Year: Never true  Transportation Needs: No Transportation Needs (07/16/2023)   PRAPARE - Administrator, Civil Service (Medical): No    Lack of Transportation (Non-Medical): No  Physical Activity: Not on file  Stress: Not on file  Social Connections: Socially Integrated (07/16/2023)   Social Connection and Isolation Panel [NHANES]    Frequency of Communication with Friends and Family: Twice a week    Frequency of Social Gatherings with Friends and Family: Twice a week    Attends Religious Services: 1 to 4 times per year    Active Member of [employer] or Organizations: Yes    Attends Banker Meetings: Never    Marital Status: Married  Catering manager Violence: Not At Risk (07/16/2023)   Humiliation, Afraid, Rape, and Kick questionnaire    Fear of Current or Ex-Partner: No    Emotionally Abused: No    Physically Abused: No    Sexually Abused: No    No Known Allergies  Current Facility-Administered Medications  Medication Dose Route Frequency Provider Last Rate Last Admin   acetaminophen  (TYLENOL ) tablet 650 mg  650 mg Oral Q6H PRN Bennie Brave, MD       Or   acetaminophen  (TYLENOL ) suppository 650 mg  650 mg Rectal Q6H PRN Bennie Brave, MD       apixaban  (ELIQUIS ) tablet 5 mg  5 mg Oral BID Goel, Hersh, MD   5 mg at 07/16/23 0850   cefTRIAXone  (ROCEPHIN ) 2 g in sodium chloride  0.9 % 100 mL IVPB  2 g Intravenous Q24H Haydee Lipa, MD       clopidogrel  (PLAVIX ) tablet 75 mg  75 mg Oral Daily Goel, Hersh, MD   75 mg at 07/16/23 0850   ezetimibe  (ZETIA ) tablet 10 mg  10 mg Oral Daily Goel, Hersh, MD   10 mg at 07/16/23 1610   finasteride  (PROSCAR ) tablet 5 mg  5 mg Oral Daily Goel, Hersh, MD   5 mg at 07/16/23 9604   insulin  aspart  (novoLOG ) injection 0-5 Units  0-5 Units Subcutaneous QHS Bennie Brave, MD       insulin  aspart (novoLOG ) injection 0-9 Units  0-9 Units Subcutaneous TID WC Bennie Brave, MD       lactated ringers  infusion   Intravenous Continuous Flonnie Humphrey, DO 150 mL/hr at 07/16/23 0502 New Bag at 07/16/23 0502   oxyCODONE  (Oxy IR/ROXICODONE ) immediate release tablet 5 mg  5 mg Oral Q6H PRN Bennie Brave, MD       pantoprazole  (PROTONIX ) EC tablet 40 mg  40 mg Oral Daily Goel, Hersh, MD   40 mg at 07/16/23 0850   polyethylene glycol (MIRALAX  / GLYCOLAX ) packet  17 g  17 g Oral Daily PRN Bennie Brave, MD       rosuvastatin  (CRESTOR ) tablet 20 mg  20 mg Oral Daily Bennie Brave, MD   20 mg at 07/16/23 0850   senna-docusate (Senokot-S) tablet 2 tablet  2 tablet Oral BID Bennie Brave, MD   2 tablet at 07/16/23 1610   sodium chloride  flush (NS) 0.9 % injection 3 mL  3 mL Intravenous Q12H Goel, Hersh, MD   3 mL at 07/16/23 0851   vancomycin  (VANCOREADY) IVPB 1500 mg/300 mL  1,500 mg Intravenous Q24H Scheryl Cushing, [place]        PHYSICAL EXAM Vitals:   07/15/23 2343 07/15/23 2349 07/16/23 0406 07/16/23 0743  BP: (!) 97/53 (!) 100/53 (!) 106/58 111/70  Pulse: 71  69 70  Resp: 17  18 17   Temp: 98.5 F (36.9 C)  98 F (36.7 C) 98.5 F (36.9 C)  TempSrc:    Oral  SpO2: 97%  96% 98%  Weight:      Height:       Elderly man in no acute distress Regular rate and rhythm Unlabored breathing 2+ femoral pulses bilaterally No palpable pedal pulses on the left No palp pedal pulses on the right             PERTINENT LABORATORY AND RADIOLOGIC DATA  Most recent CBC    Latest Ref Rng & Units 07/16/2023    9:06 AM 07/15/2023    6:29 PM 07/01/2023    5:04 AM  CBC  WBC 4.0 - 10.5 K/uL 15.6  15.3  6.6   Hemoglobin 13.0 - 17.0 g/dL 96.0  45.4  09.8   Hematocrit 39.0 - 52.0 % 34.6  36.3  36.3   Platelets 150 - 400 K/uL 167  212  398      Most recent CMP    Latest Ref Rng & Units 07/16/2023    9:06 AM  07/15/2023    6:29 PM 07/01/2023    5:04 AM  CMP  Glucose 70 - 99 mg/dL 119  147  829   BUN 8 - 23 mg/dL 18  20  23    Creatinine 0.61 - 1.24 mg/dL 5.62  1.30  8.65   Sodium 135 - 145 mmol/L 133  132  139   Potassium 3.5 - 5.1 mmol/L 3.7  3.6  4.1   Chloride 98 - 111 mmol/L 99  97  105   CO2 22 - 32 mmol/L 21  23  22    Calcium  8.9 - 10.3 mg/dL 8.0  8.3  8.5   Total Protein 6.5 - 8.1 g/dL  6.1  5.3   Total Bilirubin 0.0 - 1.2 mg/dL  1.4  1.3   Alkaline Phos 38 - 126 U/L  82  156   AST 15 - 41 U/L  32  69   ALT 0 - 44 U/L  26  66     Renal function Estimated Creatinine Clearance: 55 mL/min (by C-G formula based on SCr of 1.19 mg/dL).  Hgb A1c MFr Bld (%)  Date Value  06/17/2023 6.4 (H)    LDL Chol Calc (NIH)  Date Value Ref Range Status  07/14/2019 45 0 - 99 mg/dL Final     +-------+----------------+-----------+------------------+------------+  ABI/TBIToday's ABI     Today's TBIPrevious ABI      Previous TBI  +-------+----------------+-----------+------------------+------------+  Right AT and DP patent0.62       0.73 (occluded PT)0.45          +-------+----------------+-----------+------------------+------------+  Left  0.98            0.35       0.85              0.70          +-------+----------------+-----------+------------------+------------+   Absolute toe pressure on the left 41  Yisrael Obryan N. Edgardo Goodwill, MD FACS [facility] Phone Number: [phone] 07/16/2023 12:05 PM   Total time spent on preparing this encounter including chart review, data review, collecting history, examining the patient, and coordinating care:  inpatient consult - high complexity - 80 minutes (CPT 99255)  Portions of this report may have been transcribed using voice recognition software.  Every effort has been made to ensure accuracy; however, inadvertent computerized transcription errors may still be present.

## 2023-07-15 NOTE — H&P (Signed)
 History and Physical    Patient: Logan French ZOX:[id number] DOB: [birth year] DOA: 07/15/2023 DOS: the patient was seen and examined on 07/15/2023 PCP: [name], MD  Patient coming from: Home  Chief Complaint:  Chief Complaint  Patient presents with   Fatigue   Altered Mental Status   Wound Check   Fever   HPI: Logan French is a 89 y.o. male with medical history significant of atrial fibrillation with cardioversion 10/07/2021 per Dr.Tobb, hyperlipidemia, syncope hypertension pernicious anemia prostate cancer, peripheral vascular disease status post angioplasty of the right popliteal artery 05/17/2023 per Dr. Susi Eric.  06/03/2023 with right leg cellulitis and wound s/p abx which he seems to have completed without complication. He was discharged from rehab facility on 07/07/2023.  Furhter history from Logan French (spouse) was attempted - but there was no response to call. Voicemail left with callback instruction. Per signout and record :patinet had known bilstering of left foot (pictured in chart on 07/02/2023). It has since ulcerated and is being managed at home. Today patient has had increasing lethargy and disorientation at home. Patient is brought to ER via EMS, their transcript is pending.  Patinet noted to be febrile in the ER 102.6 with lactic acidosis and noted to have left foot erythema spreading proximally upto the calf. S.p. 1000 c LR (given prior reduced EF) as well as cefepime  +vanco+flagyl .  Medical eval is sought.  Patinet unable to provide meaningful history, see exam. Review of Systems: unable to review all systems due to the inability of the patient to answer questions. Past Medical History:  Diagnosis Date   A-fib (HCC)    B12 deficiency    Diverticulosis    Fatigue    History of colonic polyps    Hyperlipemia    Hypertension    Pernicious anemia    Prostate cancer (HCC)    Syncope    Syncope    Past Surgical History:  Procedure Laterality Date    ABDOMINAL AORTOGRAM W/LOWER EXTREMITY N/A 05/17/2023   Procedure: ABDOMINAL AORTOGRAM W/LOWER EXTREMITY;  Surgeon: Philipp Brawn, MD;  Location: [facility] INVASIVE CV LAB;  Service: Cardiovascular;  Laterality: N/A;   CARDIOVERSION N/A 10/07/2021   Procedure: CARDIOVERSION;  Surgeon: Jerryl Morin, DO;  Location: [facility] ENDOSCOPY;  Service: Cardiovascular;  Laterality: N/A;   IR ANGIO INTRA EXTRACRAN SEL COM CAROTID INNOMINATE BILAT MOD SED  09/10/2017   IR ANGIO VERTEBRAL SEL SUBCLAVIAN INNOMINATE BILAT MOD SED  09/10/2017   PERIPHERAL INTRAVASCULAR LITHOTRIPSY Right 05/17/2023   Procedure: PERIPHERAL INTRAVASCULAR LITHOTRIPSY;  Surgeon: Philipp Brawn, MD;  Location[facility] INVASIVE CV LAB;  Service: Cardiovascular;  Laterality: Right;  SFA-POP   PERIPHERAL VASCULAR BALLOON ANGIOPLASTY Right 05/17/2023   Procedure: PERIPHERAL VASCULAR BALLOON ANGIOPLASTY;  Surgeon: Philipp Brawn, MD;  Location: [facility] INVASIVE CV LAB;  Service: Cardiovascular;  Laterality: Right;  SFA-POP   Social History:  reports that he has never smoked. He has never used smokeless tobacco. He reports current alcohol  use of about 2.0 standard drinks of alcohol  per week. He reports that he does not use drugs.  No Known Allergies  History reviewed. No pertinent family history.  Prior to Admission medications   Medication Sig Start Date End Date Taking? Authorizing Provider  acetaminophen  (TYLENOL ) 325 MG tablet Take 2 tablets (650 mg total) by mouth every 6 (six) hours as needed. 07/05/23   Angiulli, Everlyn Hockey, PA-C  vitamin D3 (CHOLECALCIFEROL ) 25 MCG tablet Take 1 tablet (1,000 Units total) by mouth  daily. 07/06/23   Angiulli, Everlyn Hockey, PA-C  clopidogrel  (PLAVIX ) 75 MG tablet Take 1 tablet (75 mg total) by mouth daily. 07/06/23   Angiulli, Everlyn Hockey, PA-C  colchicine  0.6 MG tablet Take 1 tablet (0.6 mg total) by mouth daily. 07/06/23   Angiulli, Everlyn Hockey, PA-C  diclofenac  Sodium (VOLTAREN ) 1 % GEL Apply 2 g topically 4 (four) times daily.  07/06/23   Angiulli, Everlyn Hockey, PA-C  ezetimibe  (ZETIA ) 10 MG tablet Take 1 tablet (10 mg total) by mouth daily. 07/06/23   Angiulli, Everlyn Hockey, PA-C  finasteride  (PROSCAR ) 5 MG tablet Take 1 tablet (5 mg total) by mouth daily. 07/06/23   Angiulli, Everlyn Hockey, PA-C  hydrochlorothiazide  (HYDRODIURIL ) 12.5 MG tablet Take 1 tablet (12.5 mg total) by mouth daily. 07/06/23   Angiulli, Daniel J, PA-C  leptospermum manuka honey (MEDIHONEY) PSTE paste Apply 1 Application topically daily. 07/06/23   Angiulli, Everlyn Hockey, PA-C  metFORMIN  (GLUCOPHAGE ) 500 MG tablet Take 1 tablet (500 mg total) by mouth daily with breakfast. 07/06/23   Angiulli, Everlyn Hockey, PA-C  metoprolol  succinate (TOPROL -XL) 25 MG 24 hr tablet Take 1 tablet (25 mg total) by mouth every 12 (twelve) hours. 07/06/23   Angiulli, Everlyn Hockey, PA-C  oxyCODONE  (OXY IR/ROXICODONE ) 5 MG immediate release tablet Take 1 tablet (5 mg total) by mouth every 8 (eight) hours as needed for severe pain (pain score 7-10) or breakthrough pain. 07/06/23   Angiulli, Everlyn Hockey, PA-C  pantoprazole  (PROTONIX ) 40 MG tablet Take 1 tablet (40 mg total) by mouth daily at 12 noon. 07/06/23   Angiulli, Everlyn Hockey, PA-C  rosuvastatin  (CRESTOR ) 20 MG tablet Take 1 tablet (20 mg total) by mouth daily. 07/06/23   Angiulli, Everlyn Hockey, PA-C  STUDY - LIBREXIA-AF - apixaban  5 mg or placebo capsule (PI-Sethi) Take 1 capsule (5 mg total) by mouth 2 (two) times daily. Take at approximately the same time of day with or without food. Please bring bottle back with you to every visit; do not discard bottle. For Investigational Use Only. 07/07/23   Lisabeth Rider, MD  STUDY - LIBREXIA-AF - [id number] (Milvexian) 100 mg or placebo tablet (PI-Sethi) Take 1 tablet by mouth 2 (two) times daily. Take at approximately the same time of day with or without food. Please bring bottle back with you to every visit; do not discard bottle. For Investigational Use Only. 07/07/23   Lisabeth Rider, MD  cyanocobalamin  (VITAMIN B12)  1000 MCG tablet Take 0.5 tablets (500 mcg total) by mouth daily. 07/06/23   Sterling Eisenmenger, PA-C    Physical Exam: Vitals:   07/15/23 1820 07/15/23 1822  BP: 136/63 136/63  Pulse: 73   Resp: 18 18  Temp: (!) 102.6 F (39.2 C) (!) 102.6 F (39.2 C)  TempSrc:  Oral  SpO2: 97% 97%   General - aptinet is awake and alert, but appears tired and does not engage is conversation. Loses track and stops talking. For  example when asked his DOB - said [age] [birth year]... Resp exam - b/l a/e vesicular CVS: S1-S2 normal Abdomen all quadrants soft nontender Extremities 1+ edema of the left leg with erythema and warmth up to just below the knee circumferentially tender.  There are dorsal ulcers on the left foot.  See representative photo.  No discharge is noted or foul smell. I did not suspect abscess of foot based on exam.      Media Information   Document Information  Photos  07/15/2023 09:57  Attached To:  Office Visit on 07/15/23 with Timothy Ford, MD  Source Information  Naomia Bachelor  Document History    Patient follows directions with all 4 extremities.  Nonfocal no nuchal rigidity Data Reviewed:  Labs on Admission:  Results for orders placed or performed during the hospital encounter of 07/15/23 (from the past 24 hours)  CBC with Differential     Status: Abnormal   Collection Time: 07/15/23  6:29 PM  Result Value Ref Range   WBC 15.3 (H) 4.0 - 10.5 K/uL   RBC 4.10 (L) 4.22 - 5.81 MIL/uL   Hemoglobin 11.7 (L) 13.0 - 17.0 g/dL   HCT 16.1 (L) 09.6 - 04.5 %   MCV 88.5 80.0 - 100.0 fL   MCH 28.5 26.0 - 34.0 pg   MCHC 32.2 30.0 - 36.0 g/dL   RDW 40.9 (H) 81.1 - 91.4 %   Platelets 212 150 - 400 K/uL   nRBC 0.0 0.0 - 0.2 %   Neutrophils Relative % 90 %   Neutro Abs 13.7 (H) 1.7 - 7.7 K/uL   Lymphocytes Relative 4 %   Lymphs Abs 0.7 0.7 - 4.0 K/uL   Monocytes Relative 5 %   Monocytes Absolute 0.8 0.1 - 1.0 K/uL   Eosinophils  Relative 0 %   Eosinophils Absolute 0.0 0.0 - 0.5 K/uL   Basophils Relative 0 %   Basophils Absolute 0.0 0.0 - 0.1 K/uL   Immature Granulocytes 1 %   Abs Immature Granulocytes 0.10 (H) 0.00 - 0.07 K/uL  Comprehensive metabolic panel     Status: Abnormal   Collection Time: 07/15/23  6:29 PM  Result Value Ref Range   Sodium 132 (L) 135 - 145 mmol/L   Potassium 3.6 3.5 - 5.1 mmol/L   Chloride 97 (L) 98 - 111 mmol/L   CO2 23 22 - 32 mmol/L   Glucose, Bld 151 (H) 70 - 99 mg/dL   BUN 20 8 - 23 mg/dL   Creatinine, Ser 7.82 (H) 0.61 - 1.24 mg/dL   Calcium  8.3 (L) 8.9 - 10.3 mg/dL   Total Protein 6.1 (L) 6.5 - 8.1 g/dL   Albumin 2.8 (L) 3.5 - 5.0 g/dL   AST 32 15 - 41 U/L   ALT 26 0 - 44 U/L   Alkaline Phosphatase 82 38 - 126 U/L   Total Bilirubin 1.4 (H) 0.0 - 1.2 mg/dL   GFR, Estimated 56 (L) >60 mL/min   Anion gap 12 5 - 15  Blood culture (routine x 2)     Status: None (Preliminary result)   Collection Time: 07/15/23  6:29 PM   Specimen: BLOOD LEFT ARM  Result Value Ref Range   Specimen Description      BLOOD LEFT ARM Performed at [facility], [address], [place], [place] [address]    Special Requests      BOTTLES DRAWN AEROBIC AND ANAEROBIC Blood Culture results may not be optimal due to an inadequate volume of blood received in culture bottles Performed at [facility], [address] [place], [place] [address]    Culture PENDING    Report Status PENDING   I-Stat CG4 Lactic Acid     Status: Abnormal   Collection Time: 07/15/23  6:35 PM  Result Value Ref Range   Lactic Acid, Venous 3.7 (HH) 0.5 - 1.9 mmol/L   Comment NOTIFIED PHYSICIAN   Urinalysis, Routine w reflex microscopic -Urine, Clean Catch  Status: Abnormal   Collection Time: 07/15/23  7:27 PM  Result Value Ref Range   Color, Urine YELLOW YELLOW   APPearance CLEAR CLEAR   Specific Gravity, Urine 1.021 1.005 - 1.030   pH 5.0 5.0 - 8.0   Glucose, UA NEGATIVE NEGATIVE mg/dL   Hgb  urine dipstick SMALL (A) NEGATIVE   Bilirubin Urine NEGATIVE NEGATIVE   Ketones, ur NEGATIVE NEGATIVE mg/dL   Protein, ur 30 (A) NEGATIVE mg/dL   Nitrite NEGATIVE NEGATIVE   Leukocytes,Ua NEGATIVE NEGATIVE   RBC / HPF 6-10 0 - 5 RBC/hpf   WBC, UA 0-5 0 - 5 WBC/hpf   Bacteria, UA RARE (A) NONE SEEN   Squamous Epithelial / HPF 0-5 0 - 5 /HPF   Mucus PRESENT    Basic Metabolic Panel: Recent Labs  Lab 07/15/23 1829  NA 132*  K 3.6  CL 97*  CO2 23  GLUCOSE 151*  BUN 20  CREATININE 1.26*  CALCIUM  8.3*   Liver Function Tests: Recent Labs  Lab 07/15/23 1829  AST 32  ALT 26  ALKPHOS 82  BILITOT 1.4*  PROT 6.1*  ALBUMIN 2.8*   No results for input(s): "LIPASE", "AMYLASE" in the last 168 hours. No results for input(s): "AMMONIA" in the last 168 hours. CBC: Recent Labs  Lab 07/15/23 1829  WBC 15.3*  NEUTROABS 13.7*  HGB 11.7*  HCT 36.3*  MCV 88.5  PLT 212   Cardiac Enzymes: No results for input(s): "CKTOTAL", "CKMB", "CKMBINDEX", "TROPONINIHS" in the last 168 hours.  BNP (last 3 results) No results for input(s): "PROBNP" in the last 8760 hours. CBG: No results for input(s): "GLUCAP" in the last 168 hours.  Radiological Exams on Admission:  DG Foot Complete Left Result Date: 07/15/2023 CLINICAL DATA:  Ulcer, fever, altered mental status, foot wound EXAM: LEFT FOOT - COMPLETE 3+ VIEW COMPARISON:  None FINDINGS: Lateral scalloping of the head of the first metatarsal with adjacent mild sclerosis, this has a chronic appearance and could be related to gout arthropathy. 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 No malalignment at the Lisfranc joint. No fracture identified. No current bony destructive findings characteristic of osteomyelitis. Dorsal swelling of the forefoot, cellulitis is not excluded. Plantar calcaneal spur. Calcifications in the Achilles tendon along with a potential ossification along the myotendinous junction of  the Achilles shown on the lateral projection. Atheromatous vascular calcifications. No well-defined gas tracking in the soft tissues. IMPRESSION: 1. Dorsal swelling of the forefoot, cellulitis is not excluded. 2. No current bony destructive findings characteristic of osteomyelitis. 3. Lateral scalloping of the head of the first metatarsal with adjacent mild sclerosis, this has a chronic appearance and could be related to gout arthropathy. 4. 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 5. Plantar calcaneal spur. 6. Calcifications in the Achilles tendon along with a potential ossification along the myotendinous junction of the Achilles shown on the lateral projection. 7. Atheromatous vascular calcifications. Electronically Signed   By: Freida Jes M.D.   On: 07/15/2023 19:46   DG Chest Port 1 View Result Date: 07/15/2023 CLINICAL DATA:  Sepsis, fever, altered mental status EXAM: PORTABLE CHEST 1 VIEW COMPARISON:  06/22/2023 FINDINGS: Moderate enlargement of the cardiopericardial silhouette. No findings of pulmonary edema. No blunting of the costophrenic angles. The lungs appear clear. Low lung volumes. Degenerative AC joint spurring on the right. IMPRESSION: 1. Moderate enlargement of the cardiopericardial silhouette, without pulmonary edema. 2. Degenerative AC joint spurring on the right.  Electronically Signed   By: Freida Jes M.D.   On: 07/15/2023 19:43    chest X-ray  EKG: Independently reviewed. Afb rate controled.  No intake/output data recorded. No intake/output data recorded.     Assessment and Plan: * Sepsis due to undetermined organism [facility]) Present on admission evident as fever or leukocytosis.  Complicated by metabolic encephalopathy.  As well as AKI.  Patient s/p 1 L LR, I will give another liter of LR.  Patient lactic acidosis was 3.7, repeat level pending.  Chest x-ray and urinalysis clear, source of infection seems to be left leg  cellulitis.    DM (diabetes mellitus) (HCC) Change metformin  to insulin  sliding scale.  HTN (hypertension) Hold hydrochlorothiazide  till vitals stabilsed.  BPH (benign prostatic hyperplasia) C/w finasteride   Dyslipidemia C/w zetia  and crestor   Atrial fibrillation, chronic (HCC) C/w apixabna. Hold metoprolol  given severe sepsis for now. Restart in AM  Peripheral arterial disease (HCC) Patient apparently has prior known stenosis of the popliteal artery on the left leg.  ER attending discussed the case with Dr. Claudetta Cuba who has requested transfer to [facility] for vascular evaluation over there.  Look forward to their evaluation  C/w plavix .  Cellulitis At this time do not suspect an abscess.  Per report, patient had Doppler pulses on the left foot.  Therefore critical limb ischemia is not suspected.  S/p vancomycin  cefepime  and metronidazole , will continue treatment with vancomycin  plus Zosyn .  Blood cultures are pending.   Med rec done based on DC summary 07/07/2023. Please review after pharmcy input.    Advance Care Planning:   Code Status: Prior   Consults: vascualr as above.  Family Communication: could not reach wife.  Severity of Illness: The appropriate patient status for this patient is INPATIENT. Inpatient status is judged to be reasonable and necessary in order to provide the required intensity of service to ensure the patient's safety. The patient's presenting symptoms, physical exam findings, and initial radiographic and laboratory data in the context of their chronic comorbidities is felt to place them at high risk for further clinical deterioration. Furthermore, it is not anticipated that the patient will be medically stable for discharge from the hospital within 2 midnights of admission.   * I certify that at the point of admission it is my clinical judgment that the patient will require inpatient hospital care spanning beyond 2 midnights from the point of admission  due to high intensity of service, high risk for further deterioration and high frequency of surveillance required.*  Author: Bennie Brave, MD 07/15/2023 8:40 PM  For on call review [email or web address].

## 2023-07-15 NOTE — Assessment & Plan Note (Addendum)
 Present on admission evident as fever or leukocytosis.  Complicated by metabolic encephalopathy.  As well as AKI.  Patient s/p 1 L LR, I will give another liter of LR.  Patient lactic acidosis was 3.7, repeat level pending.  Chest x-ray and urinalysis clear, source of infection seems to be left leg cellulitis.

## 2023-07-15 NOTE — Assessment & Plan Note (Signed)
 C/w zetia  and crestor 

## 2023-07-15 NOTE — Sepsis Progress Note (Signed)
 Elink monitoring for the code sepsis protocol.

## 2023-07-15 NOTE — ED Notes (Signed)
 Spoke with pt's wife to make her aware that pt is being transferred to [facility].

## 2023-07-15 NOTE — ED Provider Notes (Signed)
 [place] EMERGENCY DEPARTMENT AT [facility] Provider Note   CSN: [id number] Arrival date & time: 07/15/23  1813     History  Chief Complaint  Patient presents with   Fatigue   Altered Mental Status   Wound Check   Fever    Brice Kibler is a 89 y.o. male.  HPI   89 year old male presents emergency department with concern for lethargy, altered mental status and weeping wounds of the left foot.  Of note patient recently had revascularization of the right lower extremity with chronic wound.  He had a subsequent admission for cellulitis.  Is followed by vascular, Dr. Charlotte Cookey.  Was seen today as an outpatient by Dr. Julio Ohm and the new wounds on the left foot were noted along with change in mental status and he was sent in for evaluation.  Wife states that these wounds recently just appeared on the left foot and now the foot and leg are swollen/red.  The chronic wound on the right leg has been healing well.  She believes he has been compliant with his medications including ACE, but does admit that there was antibiotic she believes he was discharged home with that he did not take.  Patient is a poor historian and history obtained from the wife at bedside.  No other noted symptoms of cough, vomiting, diarrhea, genitourinary symptoms.  Home Medications Prior to Admission medications   Medication Sig Start Date End Date Taking? Authorizing Provider  acetaminophen  (TYLENOL ) 325 MG tablet Take 2 tablets (650 mg total) by mouth every 6 (six) hours as needed. 07/05/23   Angiulli, Everlyn Hockey, PA-C  vitamin D3 (CHOLECALCIFEROL ) 25 MCG tablet Take 1 tablet (1,000 Units total) by mouth daily. 07/06/23   Angiulli, Everlyn Hockey, PA-C  clopidogrel  (PLAVIX ) 75 MG tablet Take 1 tablet (75 mg total) by mouth daily. 07/06/23   Angiulli, Everlyn Hockey, PA-C  colchicine  0.6 MG tablet Take 1 tablet (0.6 mg total) by mouth daily. 07/06/23   Angiulli, Everlyn Hockey, PA-C  diclofenac  Sodium (VOLTAREN ) 1 % GEL Apply 2 g  topically 4 (four) times daily. 07/06/23   Angiulli, Everlyn Hockey, PA-C  ezetimibe  (ZETIA ) 10 MG tablet Take 1 tablet (10 mg total) by mouth daily. 07/06/23   Angiulli, Everlyn Hockey, PA-C  finasteride  (PROSCAR ) 5 MG tablet Take 1 tablet (5 mg total) by mouth daily. 07/06/23   Angiulli, Everlyn Hockey, PA-C  hydrochlorothiazide  (HYDRODIURIL ) 12.5 MG tablet Take 1 tablet (12.5 mg total) by mouth daily. 07/06/23   Angiulli, Daniel J, PA-C  leptospermum manuka honey (MEDIHONEY) PSTE paste Apply 1 Application topically daily. 07/06/23   Angiulli, Everlyn Hockey, PA-C  metFORMIN  (GLUCOPHAGE ) 500 MG tablet Take 1 tablet (500 mg total) by mouth daily with breakfast. 07/06/23   Angiulli, Everlyn Hockey, PA-C  metoprolol  succinate (TOPROL -XL) 25 MG 24 hr tablet Take 1 tablet (25 mg total) by mouth every 12 (twelve) hours. 07/06/23   Angiulli, Everlyn Hockey, PA-C  oxyCODONE  (OXY IR/ROXICODONE ) 5 MG immediate release tablet Take 1 tablet (5 mg total) by mouth every 8 (eight) hours as needed for severe pain (pain score 7-10) or breakthrough pain. 07/06/23   Angiulli, Everlyn Hockey, PA-C  pantoprazole  (PROTONIX ) 40 MG tablet Take 1 tablet (40 mg total) by mouth daily at 12 noon. 07/06/23   Angiulli, Everlyn Hockey, PA-C  rosuvastatin  (CRESTOR ) 20 MG tablet Take 1 tablet (20 mg total) by mouth daily. 07/06/23   Angiulli, Everlyn Hockey, PA-C  STUDY - LIBREXIA-AF - apixaban  5 mg  or placebo capsule (PI-Sethi) Take 1 capsule (5 mg total) by mouth 2 (two) times daily. Take at approximately the same time of day with or without food. Please bring bottle back with you to every visit; do not discard bottle. For Investigational Use Only. 07/07/23   Lisabeth Rider, MD  STUDY - LIBREXIA-AF - [id number] (Milvexian) 100 mg or placebo tablet (PI-Sethi) Take 1 tablet by mouth 2 (two) times daily. Take at approximately the same time of day with or without food. Please bring bottle back with you to every visit; do not discard bottle. For Investigational Use Only. 07/07/23   Lisabeth Rider,  MD  cyanocobalamin  (VITAMIN B12) 1000 MCG tablet Take 0.5 tablets (500 mcg total) by mouth daily. 07/06/23   Angiulli, Everlyn Hockey, PA-C      Allergies    Patient has no known allergies.    Review of Systems   Review of Systems  Constitutional:  Negative for fever.  Respiratory:  Negative for shortness of breath.   Cardiovascular:  Negative for chest pain.  Gastrointestinal:  Negative for abdominal pain, diarrhea and vomiting.  Skin:  Positive for color change, rash and wound.       Left foot blisters, wound, redness and swelling  Neurological:  Negative for headaches.    Physical Exam Updated Vital Signs BP 136/63   Pulse 73   Temp (!) 102.6 F (39.2 C) (Oral)   Resp 18   SpO2 97%  Physical Exam Vitals and nursing note reviewed.  Constitutional:      Appearance: Normal appearance.  HENT:     Head: Normocephalic.     Mouth/Throat:     Mouth: Mucous membranes are moist.  Cardiovascular:     Rate and Rhythm: Normal rate.  Pulmonary:     Effort: Pulmonary effort is normal. No respiratory distress.  Abdominal:     Palpations: Abdomen is soft.     Tenderness: There is no abdominal tenderness.  Musculoskeletal:     Comments: + Dopplerable DP pulse on the left, slight delayed cap refill when compared to the right  Skin:    General: Skin is warm.     Comments: Blister and open ulcers in the medial aspect of the left foot with erythema, edema extending up the calf, refer to pictures in media  Neurological:     Mental Status: He is alert and oriented to person, place, and time. Mental status is at baseline.  Psychiatric:        Mood and Affect: Mood normal.          ED Results / Procedures / Treatments   Labs (all labs ordered are listed, but only abnormal results are displayed) Labs Reviewed  CBC WITH DIFFERENTIAL/PLATELET - Abnormal; Notable for the following components:      Result Value   WBC 15.3 (*)    RBC 4.10 (*)    Hemoglobin 11.7 (*)    HCT 36.3 (*)     RDW 17.5 (*)    Neutro Abs 13.7 (*)    Abs Immature Granulocytes 0.10 (*)    All other components within normal limits  COMPREHENSIVE METABOLIC PANEL WITH GFR - Abnormal; Notable for the following components:   Sodium 132 (*)    Chloride 97 (*)    Glucose, Bld 151 (*)    Creatinine, Ser 1.26 (*)    Calcium  8.3 (*)    Total Protein 6.1 (*)    Albumin 2.8 (*)    Total Bilirubin 1.4 (*)  GFR, Estimated 56 (*)    All other components within normal limits  URINALYSIS, ROUTINE W REFLEX MICROSCOPIC - Abnormal; Notable for the following components:   Hgb urine dipstick SMALL (*)    Protein, ur 30 (*)    Bacteria, UA RARE (*)    All other components within normal limits  I-STAT CG4 LACTIC ACID, ED - Abnormal; Notable for the following components:   Lactic Acid, Venous 3.7 (*)    All other components within normal limits  CULTURE, BLOOD (ROUTINE X 2)  CULTURE, BLOOD (ROUTINE X 2)  I-STAT CG4 LACTIC ACID, ED    EKG None  Radiology DG Foot Complete Left Result Date: 07/15/2023 CLINICAL DATA:  Ulcer, fever, altered mental status, foot wound EXAM: LEFT FOOT - COMPLETE 3+ VIEW COMPARISON:  None FINDINGS: Lateral scalloping of the head of the first metatarsal with adjacent mild sclerosis, this has a chronic appearance and could be related to gout arthropathy. 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 No malalignment at the Lisfranc joint. No fracture identified. No current bony destructive findings characteristic of osteomyelitis. Dorsal swelling of the forefoot, cellulitis is not excluded. Plantar calcaneal spur. Calcifications in the Achilles tendon along with a potential ossification along the myotendinous junction of the Achilles shown on the lateral projection. Atheromatous vascular calcifications. No well-defined gas tracking in the soft tissues. IMPRESSION: 1. Dorsal swelling of the forefoot, cellulitis is not excluded. 2. No current bony  destructive findings characteristic of osteomyelitis. 3. Lateral scalloping of the head of the first metatarsal with adjacent mild sclerosis, this has a chronic appearance and could be related to gout arthropathy. 4. 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 5. Plantar calcaneal spur. 6. Calcifications in the Achilles tendon along with a potential ossification along the myotendinous junction of the Achilles shown on the lateral projection. 7. Atheromatous vascular calcifications. Electronically Signed   By: Freida Jes M.D.   On: 07/15/2023 19:46   DG Chest Port 1 View Result Date: 07/15/2023 CLINICAL DATA:  Sepsis, fever, altered mental status EXAM: PORTABLE CHEST 1 VIEW COMPARISON:  06/22/2023 FINDINGS: Moderate enlargement of the cardiopericardial silhouette. No findings of pulmonary edema. No blunting of the costophrenic angles. The lungs appear clear. Low lung volumes. Degenerative AC joint spurring on the right. IMPRESSION: 1. Moderate enlargement of the cardiopericardial silhouette, without pulmonary edema. 2. Degenerative AC joint spurring on the right. Electronically Signed   By: Freida Jes M.D.   On: 07/15/2023 19:43    Procedures .Critical Care  Performed by: Flonnie Humphrey, DO Authorized by: Flonnie Humphrey, DO   Critical care provider statement:    Critical care time (minutes):  45   Critical care time was exclusive of:  Separately billable procedures and treating other patients   Critical care was necessary to treat or prevent imminent or life-threatening deterioration of the following conditions:  Sepsis and circulatory failure   Critical care was time spent personally by me on the following activities:  Development of treatment plan with patient or surrogate, discussions with consultants, evaluation of patient's response to treatment, examination of patient, ordering and review of laboratory studies, ordering and review of  radiographic studies, ordering and performing treatments and interventions, pulse oximetry, re-evaluation of patient's condition and review of old charts   I assumed direction of critical care for this patient from another provider in my specialty: no     Care discussed with: admitting provider  Medications Ordered in ED Medications  lactated ringers  infusion (has no administration in time range)  lactated ringers  bolus 1,000 mL (1,000 mLs Intravenous New Bag/Given 07/15/23 1959)  ceFEPIme  (MAXIPIME ) 2 g in sodium chloride  0.9 % 100 mL IVPB (2 g Intravenous New Bag/Given 07/15/23 2001)  metroNIDAZOLE  (FLAGYL ) IVPB 500 mg (500 mg Intravenous New Bag/Given 07/15/23 1958)  vancomycin  (VANCOCIN ) IVPB 1000 mg/200 mL premix (1,000 mg Intravenous New Bag/Given 07/15/23 1958)  acetaminophen  (TYLENOL ) tablet 1,000 mg (1,000 mg Oral Given 07/15/23 2005)    ED Course/ Medical Decision Making/ A&P                                 Medical Decision Making Amount and/or Complexity of Data Reviewed Labs: ordered. Radiology: ordered.  Risk OTC drugs. Prescription drug management. Decision regarding hospitalization.   89 year old male presents emergency department from outpatient office with concern for wounds of the left foot, altered mental status.  He is febrile on arrival, poor historian.  Normal heart rate and blood pressure.  He has blister/ulcer formation of the left medial aspect of the foot with slight delayed cap refill but + dopplerable DP pulses.  There is erythema, edema and warmth extending up the left leg.  Wife believes that he has been compliant with his medications including his anticoagulation but not recent antibiotics.  Blood work shows a leukocytosis, lactic of 3.7, slight AKI.  X-ray of the foot does not show any immediate concern for osteomyelitis. 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  Review of  recent vascular evaluation noted stenosis as well on the left leg.  For this reason I consulted vascular surgeon, Dr. Edgardo Goodwill.  Will plan to admit to medicine and transfer to [facility] for closer monitoring by vascular.  No CT imaging requested at this time.  Patients evaluation and results requires admission for further treatment and care.  Spoke with hospitalist, reviewed patient's ED course and they accept admission.  Patient agrees with admission plan, offers no new complaints and is stable/unchanged at time of admit.         Final Clinical Impression(s) / ED Diagnoses Final diagnoses:  Cellulitis, unspecified cellulitis site  Sepsis, due to unspecified organism, unspecified whether acute organ dysfunction present [facility])    Rx / DC Orders ED Discharge Orders     None         Flonnie Humphrey, DO 07/15/23 2017

## 2023-07-15 NOTE — Assessment & Plan Note (Signed)
 C/w apixabna. Hold metoprolol  given severe sepsis for now. Restart in AM

## 2023-07-15 NOTE — Assessment & Plan Note (Signed)
 Change metformin  to insulin  sliding scale.

## 2023-07-15 NOTE — Assessment & Plan Note (Signed)
 Hold hydrochlorothiazide  till vitals stabilsed.

## 2023-07-15 NOTE — Assessment & Plan Note (Addendum)
 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  ER attending discussed the case with Dr. Claudetta Cuba who has requested transfer to [name] for vascular evaluation over there.  Look forward to their evaluation  C/w plavix .

## 2023-07-15 NOTE — ED Triage Notes (Signed)
 EMS reports from home, called out for increasing lethargy, disoriented and weeping wounds on lower legs. Pt seen at PCP today.  BP 112/70 HR 80 RR 24 Sp02 98 RA CBG 174  18 LAC

## 2023-07-16 ENCOUNTER — Encounter: Payer: Self-pay | Admitting: Orthopedic Surgery

## 2023-07-16 DIAGNOSIS — A419 Sepsis, unspecified organism: Secondary | ICD-10-CM | POA: Diagnosis not present

## 2023-07-16 LAB — CBC
HCT: 34.6 % — ABNORMAL LOW (ref 39.0–52.0)
Hemoglobin: 11.1 g/dL — ABNORMAL LOW (ref 13.0–17.0)
MCH: 28.1 pg (ref 26.0–34.0)
MCHC: 32.1 g/dL (ref 30.0–36.0)
MCV: 87.6 fL (ref 80.0–100.0)
Platelets: 167 10*3/uL (ref 150–400)
RBC: 3.95 MIL/uL — ABNORMAL LOW (ref 4.22–5.81)
RDW: 17.5 % — ABNORMAL HIGH (ref 11.5–15.5)
WBC: 15.6 10*3/uL — ABNORMAL HIGH (ref 4.0–10.5)
nRBC: 0 % (ref 0.0–0.2)

## 2023-07-16 LAB — BASIC METABOLIC PANEL WITH GFR
Anion gap: 13 (ref 5–15)
BUN: 18 mg/dL (ref 8–23)
CO2: 21 mmol/L — ABNORMAL LOW (ref 22–32)
Calcium: 8 mg/dL — ABNORMAL LOW (ref 8.9–10.3)
Chloride: 99 mmol/L (ref 98–111)
Creatinine, Ser: 1.19 mg/dL (ref 0.61–1.24)
GFR, Estimated: 59 mL/min — ABNORMAL LOW (ref >60)
Glucose, Bld: 141 mg/dL — ABNORMAL HIGH (ref 70–99)
Potassium: 3.7 mmol/L (ref 3.5–5.1)
Sodium: 133 mmol/L — ABNORMAL LOW (ref 135–145)

## 2023-07-16 LAB — APTT: aPTT: 39 s — ABNORMAL HIGH (ref 24–36)

## 2023-07-16 LAB — GLUCOSE, CAPILLARY
Glucose-Capillary: 98 mg/dL (ref 70–99)
Glucose-Capillary: 131 mg/dL — ABNORMAL HIGH (ref 70–99)
Glucose-Capillary: 159 mg/dL — ABNORMAL HIGH (ref 70–99)
Glucose-Capillary: 174 mg/dL — ABNORMAL HIGH (ref 70–99)

## 2023-07-16 LAB — PROTIME-INR
INR: 2.1 — ABNORMAL HIGH (ref 0.8–1.2)
Prothrombin Time: 23.5 s — ABNORMAL HIGH (ref 11.4–15.2)

## 2023-07-16 MED ORDER — SODIUM CHLORIDE 0.9 % IV SOLN
2.0000 g | INTRAVENOUS | Status: DC
  Administered 2023-07-16 – 2023-07-21 (×5): 2 g via INTRAVENOUS
  Filled 2023-07-16 (×5): qty 20

## 2023-07-16 NOTE — Consult Note (Signed)
 WOC Nurse Consult Note:patient is known to [facility] team from previous admissions with venous ulcers  and large serum filled blisters to L foot; followed by Dr. Julio Ohm; last seen in his office 07/15/2023  Reason for Consult: multiple wounds  Wound type: full thickness r/t venous insufficiency R lower leg and L foot  Pressure Injury POA: NA, not related to pressure  Measurement: see nursing flowsheet  Wound bed: R calf brown necrotic, R achilles 75% red 25% yellow; L dorsal foot, base of Great toe and 2nd digit pink and moist  Drainage (amount, consistency, odor) see nursing flowsheet  Periwound: L foot edema and erythema  Dressing procedure/placement/frequency:  Cleanse R leg wounds with Vashe wound cleanser [employer] [phone]), do not rinse and allow to air dry.  Apply Vashe moistened gauze to wound beds daily, cover with dry gauze, ABD pad and Kerlix roll gauze starting right above toes and ending right below knee.  May secure with Ace bandage wrapped in same fashion as Kerlix for light compression.   Cleanse L dorsal foot/toe wounds with Vashe wound cleanser.  Apply Vashe moistened gauze to wound beds daily, cover with dry gauze and secure with Kerlix roll gauze.    POC discussed with bedside nurse.  Any orders placed by Dr. Julio Ohm supercede these wound care orders.  WOC team will not follow. Re-consult if further needs arise.   Thank you,    Ronni Colace MSN, RN-BC, [employer] [phone]

## 2023-07-16 NOTE — Progress Notes (Signed)
 PROGRESS NOTE    Logan French  EAV:[id number] DOB: Aug 11, 1934 DOA: 07/15/2023 PCP: [name], MD   Brief Narrative:  Logan French is a 89 y.o. male with medical history significant of atrial fibrillation with cardioversion 10/07/2021 per Dr.Tobb, hyperlipidemia, syncope hypertension pernicious anemia prostate cancer, peripheral vascular disease status post angioplasty of the right popliteal artery 05/17/2023 per Dr. Susi Eric.  06/03/2023 with right leg cellulitis and wound s/p abx which he seems to have completed without complication. He was discharged from rehab facility on 07/07/2023.  He presents to our facility with worsening left foot infection with ulceration, disorientation.  Hospitalist called for admission, vascular called in consult.  Assessment & Plan:   Principal Problem:   Sepsis due to undetermined organism [facility]) Active Problems:   Cellulitis   Peripheral arterial disease (HCC)   Atrial fibrillation, chronic (HCC)   Dyslipidemia   BPH (benign prostatic hyperplasia)   HTN (hypertension)   DM (diabetes mellitus) (HCC)  Severe sepsis due to undetermined organism (HCC) Lactic acidosis Cellulitis LLE -Imaging not remarkable for deep fluid pocket or abscess  - Vascular following, plan for angiogram Monday, 07/19/2023 - Cultures pending - Continue ceftriaxone , vancomycin  for coverage - Tachypnea, leukocytosis with notable source of cellulitis and mental status changes/lactic acidosis and endorgan damage meeting criteria for severe sepsis   Peripheral arterial disease (HCC) -Longstanding, previously evaluated by vascular surgery with right sided evaluation March 25 - Plan for left-sided evaluation on Monday as above - Continue Plavix , Eliquis  (verify if this needs to be held prior to vascular intervention)  DM (diabetes mellitus) (HCC), uncontrolled with hyperglycemia -Prior A1c 6.4 - Continue sliding scale insulin , hypoglycemic protocol, home metformin  on  hold  HTN (hypertension) Holding meds, initially hypotensive, now well-controlled without requiring further intervention   BPH (benign prostatic hyperplasia) Continue finasteride    Dyslipidemia Continue rosuvastatin , ezetimibe    Atrial fibrillation, chronic (HCC) C/w apixabna. Hold metoprolol  given severe sepsis for now. Restart in AM  DVT prophylaxis:  apixaban  (ELIQUIS ) tablet 5 mg  Code Status:   Code Status: Full Code Family Communication: None present  Status is: Inpatient  Dispo: The patient is from: Home              Anticipated d/c is to: To be determined              Anticipated d/c date is: 72+ hours              Patient currently not medically stable for discharge given need for ongoing antibiotics and vascular intervention on Monday  Consultants:  Vascular surgery  Procedures:  Planned vascular procedure Monday, 07/19/2023  Antimicrobials:  Ceftriaxone , vancomycin    Subjective: No acute issues/events overnight - denies chest pain, shortness of breath, headache, fevers, chills  Objective: Vitals:   07/15/23 2125 07/15/23 2343 07/15/23 2349 07/16/23 0406  BP: 128/63 (!) 97/53 (!) 100/53 (!) 106/58  Pulse: 71 71  69  Resp: (!) 33 17  18  Temp: (!) 102.8 F (39.3 C) 98.5 F (36.9 C)  98 F (36.7 C)  TempSrc: Oral     SpO2: 94% 97%  96%  Weight: 101.6 kg     Height: 6' (1.829 m)       Intake/Output Summary (Last 24 hours) at 07/16/2023 0727 Last data filed at 07/16/2023 0200 Gross per 24 hour  Intake 520 ml  Output --  Net 520 ml   Filed Weights   07/15/23 2125  Weight: 101.6 kg  Examination:  General:  Pleasantly resting in bed, No acute distress. HEENT:  Normocephalic atraumatic.  Sclerae nonicteric, noninjected.  Extraocular movements intact bilaterally. Neck:  Without mass or deformity.  Trachea is midline. Lungs:  Clear to auscultate bilaterally without rhonchi, wheeze, or rales. Heart:  Regular rate and rhythm.  Without murmurs, rubs,  or gallops. Abdomen:  Soft, nontender, nondistended.  Without guarding or rebound. Extremities: Bilateral lower extremity bandages clean dry intact  Data Reviewed: I have personally reviewed following labs and imaging studies  CBC: Recent Labs  Lab 07/15/23 1829  WBC 15.3*  NEUTROABS 13.7*  HGB 11.7*  HCT 36.3*  MCV 88.5  PLT 212   Basic Metabolic Panel: Recent Labs  Lab 07/15/23 1829  NA 132*  K 3.6  CL 97*  CO2 23  GLUCOSE 151*  BUN 20  CREATININE 1.26*  CALCIUM  8.3*  PHOS 2.3*   GFR: Estimated Creatinine Clearance: 51.9 mL/min (A) (by C-G formula based on SCr of 1.26 mg/dL (H)). Liver Function Tests: Recent Labs  Lab 07/15/23 1829  AST 32  ALT 26  ALKPHOS 82  BILITOT 1.4*  PROT 6.1*  ALBUMIN 2.8*   No results for input(s): "LIPASE", "AMYLASE" in the last 168 hours. No results for input(s): "AMMONIA" in the last 168 hours. Coagulation Profile: No results for input(s): "INR", "PROTIME" in the last 168 hours. Cardiac Enzymes: Recent Labs  Lab 07/15/23 1829  CKTOTAL 54   BNP (last 3 results) No results for input(s): "PROBNP" in the last 8760 hours. HbA1C: No results for input(s): "HGBA1C" in the last 72 hours. CBG: Recent Labs  Lab 07/15/23 2140 07/16/23 0618  GLUCAP 134* 98   Lipid Profile: No results for input(s): "CHOL", "HDL", "LDLCALC", "TRIG", "CHOLHDL", "LDLDIRECT" in the last 72 hours. Thyroid  Function Tests: No results for input(s): "TSH", "T4TOTAL", "FREET4", "T3FREE", "THYROIDAB" in the last 72 hours. Anemia Panel: No results for input(s): "VITAMINB12", "FOLATE", "FERRITIN", "TIBC", "IRON", "RETICCTPCT" in the last 72 hours. Sepsis Labs: Recent Labs  Lab 07/15/23 1835 07/15/23 2110  LATICACIDVEN 3.7* 3.6*    Recent Results (from the past 240 hours)  Blood culture (routine x 2)     Status: None (Preliminary result)   Collection Time: 07/15/23  6:29 PM   Specimen: BLOOD LEFT ARM  Result Value Ref Range Status   Specimen  Description   Final    BLOOD LEFT ARM Performed at [facility], [address] [place], [place] [address]    Special Requests   Final    BOTTLES DRAWN AEROBIC AND ANAEROBIC Blood Culture results may not be optimal due to an inadequate volume of blood received in culture bottles Performed at [facility], [address] [place], [place] [address]    Culture   Final    NO GROWTH < 12 HOURS Performed at [facility], [address], [place], [place] [address]    Report Status PENDING  Incomplete  Blood culture (routine x 2)     Status: None (Preliminary result)   Collection Time: 07/15/23  7:50 PM   Specimen: BLOOD RIGHT ARM  Result Value Ref Range Status   Specimen Description   Final    BLOOD RIGHT ARM Performed at [facility], [address], [place], [place] [address]    Special Requests   Final    BOTTLES DRAWN AEROBIC AND ANAEROBIC Blood Culture adequate volume Performed at [facility], [address] [place], [place] [address]  Culture   Final    NO GROWTH < 12 HOURS Performed at [facility], [address], [place], [place] [address]    Report Status PENDING  Incomplete         Radiology Studies: DG Foot Complete Left Result Date: 07/15/2023 CLINICAL DATA:  Ulcer, fever, altered mental status, foot wound EXAM: LEFT FOOT - COMPLETE 3+ VIEW COMPARISON:  None FINDINGS: Lateral scalloping of the head of the first metatarsal with adjacent mild sclerosis, this has a chronic appearance and could be related to gout arthropathy. 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 No malalignment at the Lisfranc joint. No fracture identified. No current bony destructive findings characteristic of osteomyelitis. Dorsal swelling of the forefoot, cellulitis is not excluded. Plantar calcaneal spur. Calcifications in the Achilles tendon along with a potential ossification  along the myotendinous junction of the Achilles shown on the lateral projection. Atheromatous vascular calcifications. No well-defined gas tracking in the soft tissues. IMPRESSION: 1. Dorsal swelling of the forefoot, cellulitis is not excluded. 2. No current bony destructive findings characteristic of osteomyelitis. 3. Lateral scalloping of the head of the first metatarsal with adjacent mild sclerosis, this has a chronic appearance and could be related to gout arthropathy. 4. 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 5. Plantar calcaneal spur. 6. Calcifications in the Achilles tendon along with a potential ossification along the myotendinous junction of the Achilles shown on the lateral projection. 7. Atheromatous vascular calcifications. Electronically Signed   By: Freida Jes M.D.   On: 07/15/2023 19:46   DG Chest Port 1 View Result Date: 07/15/2023 CLINICAL DATA:  Sepsis, fever, altered mental status EXAM: PORTABLE CHEST 1 VIEW COMPARISON:  06/22/2023 FINDINGS: Moderate enlargement of the cardiopericardial silhouette. No findings of pulmonary edema. No blunting of the costophrenic angles. The lungs appear clear. Low lung volumes. Degenerative AC joint spurring on the right. IMPRESSION: 1. Moderate enlargement of the cardiopericardial silhouette, without pulmonary edema. 2. Degenerative AC joint spurring on the right. Electronically Signed   By: Freida Jes M.D.   On: 07/15/2023 19:43   Scheduled Meds:  apixaban   5 mg Oral BID   clopidogrel   75 mg Oral Daily   ezetimibe   10 mg Oral Daily   finasteride   5 mg Oral Daily   insulin  aspart  0-5 Units Subcutaneous QHS   insulin  aspart  0-9 Units Subcutaneous TID WC   pantoprazole   40 mg Oral Daily   rosuvastatin   20 mg Oral Daily   senna-docusate  2 tablet Oral BID   sodium chloride  flush  3 mL Intravenous Q12H   Continuous Infusions:  lactated ringers  150 mL/hr at 07/16/23 0502    piperacillin -tazobactam (ZOSYN )  IV 3.375 g (07/16/23 0528)   vancomycin        LOS: 1 day   Time spent:  Haydee Lipa, DO [facility]  If 7PM-7AM, please contact night-coverage [email or web address]  07/16/2023, 7:27 AM

## 2023-07-16 NOTE — Progress Notes (Addendum)
 Office Visit Note   Patient: Logan French           Date of Birth: [birth year]           MRN: [id number] Visit Date: 07/15/2023              Requested by: [name], MD [address] [place],  [place] [address] PCP: [name], MD  Chief Complaint  Patient presents with   Right Leg - Wound Check      HPI: Patient is an 89 year old gentleman is seen in follow-up for ulceration right and left lower extremity.  Patient is status post revascularization to the right lower extremity.  Patient is currently using Vashe dressing changes and was in a PRAFO boot but he states he cannot tolerate the PRAFO boot.  Patient states he was in inpatient rehab for 23 days.  Patient states he is currently on his third day of Lasix .  He states he is post to take this for 5 days.  Assessment & Plan: Visit Diagnoses:  1. PAD (peripheral artery disease) (HCC)   2. Non-pressure chronic ulcer of other part of left foot limited to breakdown of skin (HCC)     Plan: Patient states he cannot tolerate a pressure offloading boot.  Recommended float the heels with using a pillow under the calf as well as elevation.  We will wrap both legs with a Dynaflex wrap.  Follow-Up Instructions: Return in about 1 week (around 07/22/2023).   Ortho Exam  Patient is alert, oriented, no adenopathy, well-dressed, normal affect, normal respiratory effort. Examination patient has weeping edema from both legs.  He has a flat granulating wound over the right Achilles that measures 1 x 5 cm.  On the left foot he has a dorsal wound that measures 3 x 5 cm and a left heel ulcer that is 3 x 3 cm.  Patient also has a superficial ulcer on the left great toe.  Patient has a palpable pulse bilaterally.  Patient talks with his eyes closed with massive venous stasis swelling left worse than right.  Imaging: DG Foot Complete Left Result Date: 07/15/2023 CLINICAL DATA:  Ulcer, fever, altered mental status, foot wound EXAM:  LEFT FOOT - COMPLETE 3+ VIEW COMPARISON:  None FINDINGS: Lateral scalloping of the head of the first metatarsal with adjacent mild sclerosis, this has a chronic appearance and could be related to gout arthropathy. 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 No malalignment at the Lisfranc joint. No fracture identified. No current bony destructive findings characteristic of osteomyelitis. Dorsal swelling of the forefoot, cellulitis is not excluded. Plantar calcaneal spur. Calcifications in the Achilles tendon along with a potential ossification along the myotendinous junction of the Achilles shown on the lateral projection. Atheromatous vascular calcifications. No well-defined gas tracking in the soft tissues. IMPRESSION: 1. Dorsal swelling of the forefoot, cellulitis is not excluded. 2. No current bony destructive findings characteristic of osteomyelitis. 3. Lateral scalloping of the head of the first metatarsal with adjacent mild sclerosis, this has a chronic appearance and could be related to gout arthropathy. 4. 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 5. Plantar calcaneal spur. 6. Calcifications in the Achilles tendon along with a potential ossification along the myotendinous junction of the Achilles shown on the lateral projection. 7. Atheromatous vascular calcifications. Electronically Signed   By: Freida Jes M.D.   On: 07/15/2023 19:46   DG Chest [facility]  Result Date: 07/15/2023 CLINICAL DATA:  Sepsis, fever, altered mental status EXAM: PORTABLE CHEST 1 VIEW COMPARISON:  06/22/2023 FINDINGS: Moderate enlargement of the cardiopericardial silhouette. No findings of pulmonary edema. No blunting of the costophrenic angles. The lungs appear clear. Low lung volumes. Degenerative AC joint spurring on the right. IMPRESSION: 1. Moderate enlargement of the cardiopericardial silhouette, without pulmonary edema. 2.  Degenerative AC joint spurring on the right. Electronically Signed   By: Freida Jes M.D.   On: 07/15/2023 19:43        Labs: Lab Results  Component Value Date   HGBA1C 6.4 (H) 06/17/2023   CRP 26.9 (H) 06/23/2023   CRP 27.5 (H) 06/22/2023   CRP 27.6 (H) 06/21/2023   LABURIC 5.0 06/22/2023   LABURIC 5.3 06/21/2023   REPTSTATUS PENDING 07/15/2023   GRAMSTAIN Rare 10/29/2015   GRAMSTAIN WBC present-predominately Mononuclear 10/29/2015   GRAMSTAIN No Squamous Epithelial Cells Seen 10/29/2015   GRAMSTAIN Moderate Gram Positive Cocci In Pairs In Clusters 10/29/2015   CULT  07/15/2023    NO GROWTH < 12 HOURS Performed at [facility], [address], [place], [place] [address]    LABORGA GROUP A STREP (S.PYOGENES) ISOLATED 06/17/2023     Lab Results  Component Value Date   ALBUMIN 2.8 (L) 07/15/2023   ALBUMIN 2.1 (L) 07/01/2023   ALBUMIN 1.9 (L) 06/28/2023    Lab Results  Component Value Date   MG 2.0 06/23/2023   MG 2.0 06/22/2023   MG 2.0 06/21/2023   Lab Results  Component Value Date   VD25OH 27.89 (L) 06/26/2023    No results found for: "PREALBUMIN"    Latest Ref Rng & Units 07/16/2023    9:06 AM 07/15/2023    6:29 PM 07/01/2023    5:04 AM  CBC EXTENDED  WBC 4.0 - 10.5 K/uL 15.6  15.3  6.6   RBC 4.22 - 5.81 MIL/uL 3.95  4.10  4.16   Hemoglobin 13.0 - 17.0 g/dL 60.4  54.0  98.1   HCT 39.0 - 52.0 % 34.6  36.3  36.3   Platelets 150 - 400 K/uL 167  212  398   NEUT# 1.7 - 7.7 K/uL  13.7    Lymph# 0.7 - 4.0 K/uL  0.7       There is no height or weight on file to calculate BMI.  Orders:  No orders of the defined types were placed in this encounter.  No orders of the defined types were placed in this encounter.    Procedures: No procedures performed  Clinical Data: No additional findings.  ROS:  All other systems negative, except as noted in the HPI. Review of Systems  Objective: Vital Signs: There were no vitals taken for this  visit.  Specialty Comments:  No specialty comments available.  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) 06/20/2023   Streptococcal bacteremia 06/18/2023   Cellulitis of right lower extremity 06/17/2023   Persistent atrial fibrillation (HCC) 09/23/2021   Hypercoagulable state due to persistent atrial fibrillation (HCC)  09/23/2021   Callus 10/25/2019   Corns and callosities 10/25/2019   Hav (hallux abducto valgus), unspecified laterality 10/25/2019   Altered awareness, transient 06/23/2017   MALLET FINGER, RIGHT RING FINGER 10/19/2008   Past Medical History:  Diagnosis Date   A-fib (HCC)    B12 deficiency    Diverticulosis    Fatigue    History of colonic polyps    Hyperlipemia    Hypertension    Pernicious anemia    Prostate cancer (HCC)    Syncope    Syncope     History reviewed. No pertinent family history.  Past Surgical History:  Procedure Laterality Date   ABDOMINAL AORTOGRAM W/LOWER EXTREMITY N/A 05/17/2023   Procedure: ABDOMINAL AORTOGRAM W/LOWER EXTREMITY;  Surgeon: Philipp Brawn, MD;  Location: [facility] INVASIVE CV LAB;  Service: Cardiovascular;  Laterality: N/A;   CARDIOVERSION N/A 10/07/2021   Procedure: CARDIOVERSION;  Surgeon: Jerryl Morin, DO;  Location: [facility] ENDOSCOPY;  Service: Cardiovascular;  Laterality: N/A;   IR ANGIO INTRA EXTRACRAN SEL COM CAROTID INNOMINATE BILAT MOD SED  09/10/2017   IR ANGIO VERTEBRAL SEL SUBCLAVIAN INNOMINATE BILAT MOD SED  09/10/2017   PERIPHERAL  INTRAVASCULAR LITHOTRIPSY Right 05/17/2023   Procedure: PERIPHERAL INTRAVASCULAR LITHOTRIPSY;  Surgeon: Philipp Brawn, MD;  Location: [facility] INVASIVE CV LAB;  Service: Cardiovascular;  Laterality: Right;  SFA-POP   PERIPHERAL VASCULAR BALLOON ANGIOPLASTY Right 05/17/2023   Procedure: PERIPHERAL VASCULAR BALLOON ANGIOPLASTY;  Surgeon: Philipp Brawn, MD;  Location: [facility] INVASIVE CV LAB;  Service: Cardiovascular;  Laterality: Right;  SFA-POP   Social History   Occupational History   Not on file  Tobacco Use   Smoking status: Never   Smokeless tobacco: Never   Tobacco comments:    Never smoke 09/23/21  Vaping Use   Vaping status: Never Used  Substance and Sexual Activity   Alcohol  use: Yes    Alcohol /week: 2.0 standard drinks of alcohol     Types: 1 Cans of beer, 1 Shots of liquor per week    Comment: occasionally   Drug use: No   Sexual activity: Not on file

## 2023-07-16 NOTE — Progress Notes (Signed)
 Unable to complete pt admission questions due pt altered mental statu

## 2023-07-17 DIAGNOSIS — A419 Sepsis, unspecified organism: Secondary | ICD-10-CM | POA: Diagnosis not present

## 2023-07-17 LAB — CBC
HCT: 36.9 % — ABNORMAL LOW (ref 39.0–52.0)
Hemoglobin: 11.4 g/dL — ABNORMAL LOW (ref 13.0–17.0)
MCH: 28.4 pg (ref 26.0–34.0)
MCHC: 30.9 g/dL (ref 30.0–36.0)
MCV: 92 fL (ref 80.0–100.0)
Platelets: 148 10*3/uL — ABNORMAL LOW (ref 150–400)
RBC: 4.01 MIL/uL — ABNORMAL LOW (ref 4.22–5.81)
RDW: 17.6 % — ABNORMAL HIGH (ref 11.5–15.5)
WBC: 16.1 10*3/uL — ABNORMAL HIGH (ref 4.0–10.5)
nRBC: 0 % (ref 0.0–0.2)

## 2023-07-17 LAB — BASIC METABOLIC PANEL WITH GFR
Anion gap: 10 (ref 5–15)
BUN: 18 mg/dL (ref 8–23)
CO2: 23 mmol/L (ref 22–32)
Calcium: 8 mg/dL — ABNORMAL LOW (ref 8.9–10.3)
Chloride: 99 mmol/L (ref 98–111)
Creatinine, Ser: 0.95 mg/dL (ref 0.61–1.24)
GFR, Estimated: >60 mL/min (ref >60)
Glucose, Bld: 105 mg/dL — ABNORMAL HIGH (ref 70–99)
Potassium: 4.1 mmol/L (ref 3.5–5.1)
Sodium: 132 mmol/L — ABNORMAL LOW (ref 135–145)

## 2023-07-17 LAB — GLUCOSE, CAPILLARY
Glucose-Capillary: 107 mg/dL — ABNORMAL HIGH (ref 70–99)
Glucose-Capillary: 111 mg/dL — ABNORMAL HIGH (ref 70–99)
Glucose-Capillary: 123 mg/dL — ABNORMAL HIGH (ref 70–99)
Glucose-Capillary: 103 mg/dL — ABNORMAL HIGH (ref 70–99)
Glucose-Capillary: 149 mg/dL — ABNORMAL HIGH (ref 70–99)

## 2023-07-17 NOTE — Progress Notes (Signed)
 PROGRESS NOTE    Logan French  ZOX:[id number] DOB: 1934-09-27 DOA: 07/15/2023 PCP: [name], MD   Brief Narrative:  Logan French is a 89 y.o. male with medical history significant of atrial fibrillation with cardioversion 10/07/2021 per Dr.Tobb, hyperlipidemia, syncope hypertension pernicious anemia prostate cancer, peripheral vascular disease status post angioplasty of the right popliteal artery 05/17/2023 per Dr. Susi Eric.  06/03/2023 with right leg cellulitis and wound s/p abx which he seems to have completed without complication. He was discharged from rehab facility on 07/07/2023.  He presents to our facility with worsening left foot infection with ulceration, disorientation.  Hospitalist called for admission, vascular called in consult.  Assessment & Plan:   Principal Problem:   Sepsis due to undetermined organism [facility]) Active Problems:   Cellulitis   Peripheral arterial disease (HCC)   Atrial fibrillation, chronic (HCC)   Dyslipidemia   BPH (benign prostatic hyperplasia)   HTN (hypertension)   DM (diabetes mellitus) (HCC)  Severe sepsis due to undetermined organism (HCC) Lactic acidosis Cellulitis LLE -Imaging not remarkable for deep fluid pocket or abscess  - Vascular following, plan for angiogram Monday, 07/19/2023 - Cultures pending - Continue ceftriaxone , vancomycin  for coverage - Tachypnea, leukocytosis with notable source of cellulitis and mental status changes/lactic acidosis and endorgan damage meeting criteria for severe sepsis   Peripheral arterial disease (HCC) -Longstanding, previously evaluated by vascular surgery with right sided evaluation March 25 - Plan for left-sided evaluation on Monday as above - Continue Plavix , Eliquis  (defer to vascular if this needs to be held prior to procedure)  DM (diabetes mellitus) (HCC), uncontrolled with hyperglycemia -Prior A1c 6.4 - Continue sliding scale insulin , hypoglycemic protocol, home metformin  on  hold  HTN (hypertension) Holding meds, initially hypotensive, now well-controlled without requiring further intervention   BPH (benign prostatic hyperplasia) Continue finasteride    Dyslipidemia Continue rosuvastatin , ezetimibe    Atrial fibrillation, chronic (HCC) C/w apixabna. Hold metoprolol  given severe sepsis for now. Restart in AM  DVT prophylaxis:  apixaban  (ELIQUIS ) tablet 5 mg  Code Status:   Code Status: Full Code Family Communication: None present  Status is: Inpatient  Dispo: The patient is from: Home              Anticipated d/c is to: To be determined              Anticipated d/c date is: 72+ hours              Patient currently not medically stable for discharge given need for ongoing antibiotics and vascular intervention on Monday  Consultants:  Vascular surgery  Procedures:  Planned vascular procedure Monday, 07/19/2023  Antimicrobials:  Ceftriaxone , vancomycin    Subjective: No acute issues/events overnight - denies chest pain, shortness of breath, headache, fevers, chills  Objective: Vitals:   07/16/23 0743 07/16/23 1300 07/16/23 2123 07/17/23 0500  BP: 111/70 128/63 130/65 122/77  Pulse: 70 71 73 74  Resp: 17  18 18   Temp: 98.5 F (36.9 C) 98.3 F (36.8 C) 100.2 F (37.9 C) 98.4 F (36.9 C)  TempSrc: Oral Oral Oral Oral  SpO2: 98% 99% 96% 95%  Weight:      Height:        Intake/Output Summary (Last 24 hours) at 07/17/2023 0801 Last data filed at 07/17/2023 8119 Gross per 24 hour  Intake 3018.02 ml  Output 350 ml  Net 2668.02 ml   Filed Weights   07/15/23 2125  Weight: 101.6 kg    Examination:  General:  Pleasantly resting in bed, No acute distress. HEENT:  Normocephalic atraumatic.  Sclerae nonicteric, noninjected.  Extraocular movements intact bilaterally. Neck:  Without mass or deformity.  Trachea is midline. Lungs:  Clear to auscultate bilaterally without rhonchi, wheeze, or rales. Heart:  Regular rate and rhythm.  Without  murmurs, rubs, or gallops. Abdomen:  Soft, nontender, nondistended.  Without guarding or rebound. Extremities: Bilateral lower extremity bandages clean dry intact  Data Reviewed: I have personally reviewed following labs and imaging studies  CBC: Recent Labs  Lab 07/15/23 1829 07/16/23 0906 07/17/23 0625  WBC 15.3* 15.6* 16.1*  NEUTROABS 13.7*  --   --   HGB 11.7* 11.1* 11.4*  HCT 36.3* 34.6* 36.9*  MCV 88.5 87.6 92.0  PLT 212 167 148*   Basic Metabolic Panel: Recent Labs  Lab 07/15/23 1829 07/16/23 0906  NA 132* 133*  K 3.6 3.7  CL 97* 99  CO2 23 21*  GLUCOSE 151* 141*  BUN 20 18  CREATININE 1.26* 1.19  CALCIUM  8.3* 8.0*  PHOS 2.3*  --    GFR: Estimated Creatinine Clearance: 55 mL/min (by C-G formula based on SCr of 1.19 mg/dL). Liver Function Tests: Recent Labs  Lab 07/15/23 1829  AST 32  ALT 26  ALKPHOS 82  BILITOT 1.4*  PROT 6.1*  ALBUMIN 2.8*   No results for input(s): "LIPASE", "AMYLASE" in the last 168 hours. No results for input(s): "AMMONIA" in the last 168 hours. Coagulation Profile: Recent Labs  Lab 07/16/23 0906  INR 2.1*   Cardiac Enzymes: Recent Labs  Lab 07/15/23 1829  CKTOTAL 54   BNP (last 3 results) No results for input(s): "PROBNP" in the last 8760 hours. HbA1C: No results for input(s): "HGBA1C" in the last 72 hours. CBG: Recent Labs  Lab 07/16/23 0618 07/16/23 1308 07/16/23 1609 07/16/23 1959 07/17/23 0612  GLUCAP 98 131* 159* 174* 107*   Lipid Profile: No results for input(s): "CHOL", "HDL", "LDLCALC", "TRIG", "CHOLHDL", "LDLDIRECT" in the last 72 hours. Thyroid  Function Tests: No results for input(s): "TSH", "T4TOTAL", "FREET4", "T3FREE", "THYROIDAB" in the last 72 hours. Anemia Panel: No results for input(s): "VITAMINB12", "FOLATE", "FERRITIN", "TIBC", "IRON", "RETICCTPCT" in the last 72 hours. Sepsis Labs: Recent Labs  Lab 07/15/23 1835 07/15/23 2110  LATICACIDVEN 3.7* 3.6*    Recent Results (from the  past 240 hours)  Blood culture (routine x 2)     Status: None (Preliminary result)   Collection Time: 07/15/23  6:29 PM   Specimen: BLOOD LEFT ARM  Result Value Ref Range Status   Specimen Description   Final    BLOOD LEFT ARM Performed at [facility], [address], [place], [place] [address]    Special Requests   Final    BOTTLES DRAWN AEROBIC AND ANAEROBIC Blood Culture results may not be optimal due to an inadequate volume of blood received in culture bottles Performed at [facility], [address], [place], [place] [address]    Culture   Final    NO GROWTH 2 DAYS Performed at [facility], [address], [place], [place] [address]    Report Status PENDING  Incomplete  Blood culture (routine x 2)     Status: None (Preliminary result)   Collection Time: 07/15/23  7:50 PM   Specimen: BLOOD RIGHT ARM  Result Value Ref Range Status   Specimen Description   Final    BLOOD RIGHT ARM Performed at [facility], [address]  [place], [place] [address]    Special Requests   Final    BOTTLES DRAWN AEROBIC AND ANAEROBIC Blood Culture adequate volume Performed at [facility], [address], [place], [place] [address]    Culture   Final    NO GROWTH 2 DAYS Performed at [facility], [address] [place], [place] [address]    Report Status PENDING  Incomplete         Radiology Studies: DG Foot Complete Left Result Date: 07/15/2023 CLINICAL DATA:  Ulcer, fever, altered mental status, foot wound EXAM: LEFT FOOT - COMPLETE 3+ VIEW COMPARISON:  None FINDINGS: Lateral scalloping of the head of the first metatarsal with adjacent mild sclerosis, this has a chronic appearance and could be related to gout arthropathy. 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 No malalignment at the Lisfranc joint. No fracture identified. No current bony destructive findings  characteristic of osteomyelitis. Dorsal swelling of the forefoot, cellulitis is not excluded. Plantar calcaneal spur. Calcifications in the Achilles tendon along with a potential ossification along the myotendinous junction of the Achilles shown on the lateral projection. Atheromatous vascular calcifications. No well-defined gas tracking in the soft tissues. IMPRESSION: 1. Dorsal swelling of the forefoot, cellulitis is not excluded. 2. No current bony destructive findings characteristic of osteomyelitis. 3. Lateral scalloping of the head of the first metatarsal with adjacent mild sclerosis, this has a chronic appearance and could be related to gout arthropathy. 4. 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 5. Plantar calcaneal spur. 6. Calcifications in the Achilles tendon along with a potential ossification along the myotendinous junction of the Achilles shown on the lateral projection. 7. Atheromatous vascular calcifications. Electronically Signed   By: Freida Jes M.D.   On: 07/15/2023 19:46   DG Chest Port 1 View Result Date: 07/15/2023 CLINICAL DATA:  Sepsis, fever, altered mental status EXAM: PORTABLE CHEST 1 VIEW COMPARISON:  06/22/2023 FINDINGS: Moderate enlargement of the cardiopericardial silhouette. No findings of pulmonary edema. No blunting of the costophrenic angles. The lungs appear clear. Low lung volumes. Degenerative AC joint spurring on the right. IMPRESSION: 1. Moderate enlargement of the cardiopericardial silhouette, without pulmonary edema. 2. Degenerative AC joint spurring on the right. Electronically Signed   By: Freida Jes M.D.   On: 07/15/2023 19:43   Scheduled Meds:  apixaban   5 mg Oral BID   clopidogrel   75 mg Oral Daily   ezetimibe   10 mg Oral Daily   finasteride   5 mg Oral Daily   insulin  aspart  0-5 Units Subcutaneous QHS   insulin  aspart  0-9 Units Subcutaneous TID WC   pantoprazole   40 mg Oral Daily   rosuvastatin    20 mg Oral Daily   senna-docusate  2 tablet Oral BID   sodium chloride  flush  3 mL Intravenous Q12H   Continuous Infusions:  cefTRIAXone  (ROCEPHIN )  IV 2 g (07/16/23 1323)   vancomycin  1,500 mg (07/16/23 1826)     LOS: 2 days   Time spent:  Haydee Lipa, DO [facility]  If 7PM-7AM, please contact night-coverage [email or web address]  07/17/2023, 8:01 AM

## 2023-07-17 NOTE — Evaluation (Signed)
 Physical Therapy Evaluation Patient Details Name: Logan French MRN: [id number] DOB: [birth year] Today's Date: 07/17/2023  History of Present Illness  Pt is an 89 y/o male admitted He presents to our facility with worsening left foot infection with ulceration, disorientatio, prior admission in March 2025 for RLE angioplasty, April for for RLE cellulitis and nonhealing wound. +for bacetermia. PMH: prostate cancer, anemia, HTN, HLD, a fib, b12 deficiency, PAD s/p vascular intervention.  Clinical Impression  Pt presents with admitting diagnosis above. Co-treat with OT. Pt today was able to ambulate short distance to chair today however required significantly increased time with RW CGA +2 for safety. Pt very limited by pain and required constant cues to breathe as pt was noted to hold breath likely due to pain. PTA pt reports he was ambulating household distances with RW and was recently DC from CIR 2 weeks ago. DC recs TBD pending vascular surgery on Monday however based on today pt would likely need inpatient rehab of some sort upon DC. PT will continue to follow.        If plan is discharge home, recommend the following: Assistance with cooking/housework;Assist for transportation;Help with stairs or ramp for entrance;A lot of help with bathing/dressing/bathroom;A lot of help with walking and/or transfers   Can travel by private vehicle        Equipment Recommendations BSC/3in1;Wheelchair (measurements PT);Wheelchair cushion (measurements PT)  Recommendations for Other Services       Functional Status Assessment Patient has had a recent decline in their functional status and demonstrates the ability to make significant improvements in function in a reasonable and predictable amount of time.     Precautions / Restrictions Precautions Precautions: Fall Recall of Precautions/Restrictions: Intact Restrictions Weight Bearing Restrictions Per Provider Order: No      Mobility  Bed  Mobility Overal bed mobility: Needs Assistance Bed Mobility: Supine to Sit     Supine to sit: Contact guard, Used rails     General bed mobility comments: CGA from flat bed, used rails, increased time and effort to assist to EOB    Transfers Overall transfer level: Needs assistance Equipment used: Rolling walker (2 wheels) Transfers: Sit to/from Stand, Bed to chair/wheelchair/BSC Sit to Stand: Contact guard assist, From elevated surface   Step pivot transfers: Contact guard assist, From elevated surface       General transfer comment: CGA from elevated surface using RW for support to get to recliner    Ambulation/Gait Ambulation/Gait assistance: Contact guard assist, +2 safety/equipment Gait Distance (Feet): 5 Feet Assistive device: Rolling walker (2 wheels) Gait Pattern/deviations: Trunk flexed, Antalgic, Decreased stride length, Step-to pattern Gait velocity: decreased     General Gait Details: Pt with very slowed antalgic gait to chair. Pt required cues to breathe as he was noted several times to hold his breathe due to pain.  Stairs            Wheelchair Mobility     Tilt Bed    Modified Rankin (Stroke Patients Only)       Balance Overall balance assessment: Needs assistance Sitting-balance support: No upper extremity supported, Feet supported Sitting balance-Leahy Scale: Fair     Standing balance support: Bilateral upper extremity supported, During functional activity, Reliant on assistive device for balance Standing balance-Leahy Scale: Poor Standing balance comment: reliant on RW for support, painful to WB through LLE  Pertinent Vitals/Pain Pain Assessment Pain Assessment: Faces Faces Pain Scale: Hurts even more Pain Location: LLE with WB Pain Descriptors / Indicators: Discomfort, Grimacing, Guarding Pain Intervention(s): Monitored during session, Limited activity within patient's tolerance    Home  Living Family/patient expects to be discharged to:: Private residence Living Arrangements: Spouse/significant other Available Help at Discharge: Family;Available PRN/intermittently Type of Home: House Home Access: Stairs to enter Entrance Stairs-Rails: Can reach both Entrance Stairs-Number of Steps: 3 STE front entry with R ledge vs rail, planning to install rail Alternate Level Stairs-Number of Steps: 2 steps down into kitchen/living room with railing. main level has bedroom/bathroom and an upstairs that pt does not have to use Home Layout: Multi-level;Able to live on main level with bedroom/bathroom Home Equipment: Cane - single point;Rollator (4 wheels);Rolling Walker (2 wheels);Shower seat;Hand held shower head;Toilet riser;Grab bars - toilet;Grab bars - tub/shower      Prior Function Prior Level of Function : Needs assist             Mobility Comments: using RW at home from recent discharge from CIR 3/23 ADLs Comments: wife helps with sock/shoes, sponge bathing     Extremity/Trunk Assessment   Upper Extremity Assessment Upper Extremity Assessment: Generalized weakness    Lower Extremity Assessment Lower Extremity Assessment: LLE deficits/detail;RLE deficits/detail RLE Deficits / Details: Edematous, erythematous LLE Deficits / Details: Edematous, erythematous       Communication   Communication Communication: No apparent difficulties    Cognition Arousal: Alert Behavior During Therapy: WFL for tasks assessed/performed                             Following commands: Intact       Cueing Cueing Techniques: Verbal cues     General Comments General comments (skin integrity, edema, etc.): VSS    Exercises     Assessment/Plan    PT Assessment Patient needs continued PT services  PT Problem List Decreased strength;Decreased range of motion;Decreased activity tolerance;Decreased balance;Decreased mobility;Decreased cognition;Decreased  coordination;Decreased knowledge of use of DME;Decreased safety awareness;Decreased knowledge of precautions;Obesity;Decreased skin integrity;Pain       PT Treatment Interventions DME instruction;Gait training;Functional mobility training;Therapeutic exercise;Therapeutic activities;Neuromuscular re-education;Balance training;Cognitive remediation;Patient/family education;Wheelchair mobility training    PT Goals (Current goals can be found in the Care Plan section)  Acute Rehab PT Goals Patient Stated Goal: Get well, reduce pain PT Goal Formulation: With patient Time For Goal Achievement: 07/31/23 Potential to Achieve Goals: Fair    Frequency Min 3X/week     Co-evaluation   Reason for Co-Treatment: Complexity of the patient's impairments (multi-system involvement);Necessary to address cognition/behavior during functional activity;For patient/therapist safety;To address functional/ADL transfers   OT goals addressed during session: ADL's and self-care;Proper use of Adaptive equipment and DME       AM-PAC PT "6 Clicks" Mobility  Outcome Measure Help needed turning from your back to your side while in a flat bed without using bedrails?: A Little Help needed moving from lying on your back to sitting on the side of a flat bed without using bedrails?: A Little Help needed moving to and from a bed to a chair (including a wheelchair)?: A Little Help needed standing up from a chair using your arms (e.g., wheelchair or bedside chair)?: A Little Help needed to walk in hospital room?: A Lot Help needed climbing 3-5 steps with a railing? : Total 6 Click Score: 15    End of Session Equipment Utilized During Treatment: Gait  belt Activity Tolerance: Patient tolerated treatment well Patient left: in bed;with call bell/phone within reach;with family/visitor present Nurse Communication: Mobility status PT Visit Diagnosis: Unsteadiness on feet (R26.81);Muscle weakness (generalized)  (M62.81);Difficulty in walking, not elsewhere classified (R26.2);Pain;Other abnormalities of gait and mobility (R26.89) Pain - Right/Left: Left Pain - part of body: Leg;Ankle and joints of foot    Time: 1610-9604 PT Time Calculation (min) (ACUTE ONLY): 35 min   Charges:   PT Evaluation $PT Eval Moderate Complexity: 1 Mod   PT General Charges $$ ACUTE PT VISIT: 1 Visit         Rodgers Clack, PT, DPT Acute Rehab Services [phone]   Merwin Breden 07/17/2023, 2:07 PM

## 2023-07-17 NOTE — Evaluation (Signed)
 Occupational Therapy Evaluation Patient Details Name: Logan French MRN: [id number] DOB: 1934-09-26 Today's Date: 07/17/2023   History of Present Illness   Pt is an 89 y/o male admitted He presents to our facility with worsening left foot infection with ulceration, disorientatio, prior admission in March 2025 for RLE angioplasty, April for for RLE cellulitis and nonhealing wound. +for bacetermia. PMH: prostate cancer, anemia, HTN, HLD, a fib, b12 deficiency, PAD s/p vascular intervention.     Clinical Impressions Pt resting comfortably in bed, swelling/redness noted to LLE, c/o mild discomfort at rest, increases with upright activity. Co-treat with PT. Son present during session. Pt from home, lives with wife who works part time. Pt with DC to home from recent CIR admission ~1 month ago, has been ambulating with RW, wife helps occasionally with LB dressing. Pt currently limited due to LLE swelling/pain with ambulation. Pt CGA from elevated surface for transfer, max effort from Pt, cueing to continue breathing during activities. Pt required 2-3 minutes to ambulate 5 feet, painful with WB through LLE. Pt max A for LB dressing/bathing. At this time Pt would likely benefit from postacute rehab, depending on amount of help at DC and level of function after surgery planned for 07/19/23, will return for re-eval to determine DC plan on Monday/Tuesday.      If plan is discharge home, recommend the following:   A lot of help with walking and/or transfers;A lot of help with bathing/dressing/bathroom;Assistance with cooking/housework;Assist for transportation;Help with stairs or ramp for entrance     Functional Status Assessment   Patient has had a recent decline in their functional status and demonstrates the ability to make significant improvements in function in a reasonable and predictable amount of time.     Equipment Recommendations    (TBD, may benefit from manual w/c and BSC upon DC, will  verify after surgery 07/19/23)     Recommendations for Other Services         Precautions/Restrictions   Precautions Precautions: Fall Recall of Precautions/Restrictions: Intact Restrictions Weight Bearing Restrictions Per Provider Order: No     Mobility Bed Mobility Overal bed mobility: Needs Assistance Bed Mobility: Supine to Sit     Supine to sit: Contact guard, Used rails     General bed mobility comments: CGA from flat bed, used rails, increased time and effort to assist to EOB    Transfers Overall transfer level: Needs assistance Equipment used: Rolling walker (2 wheels) Transfers: Sit to/from Stand, Bed to chair/wheelchair/BSC Sit to Stand: Contact guard assist, From elevated surface     Step pivot transfers: Contact guard assist, From elevated surface     General transfer comment: CGA from elevated surface using RW for support to get to recliner      Balance Overall balance assessment: Needs assistance Sitting-balance support: No upper extremity supported, Feet supported Sitting balance-Leahy Scale: Fair     Standing balance support: Bilateral upper extremity supported, During functional activity, Reliant on assistive device for balance Standing balance-Leahy Scale: Poor Standing balance comment: reliant on RW for support, painful to WB through LLE                           ADL either performed or assessed with clinical judgement   ADL Overall ADL's : Needs assistance/impaired Eating/Feeding: Independent   Grooming: Set up;Sitting   Upper Body Bathing: Set up;Sitting   Lower Body Bathing: Maximal assistance;Sit to/from stand   Upper Body Dressing :  Set up;Sitting   Lower Body Dressing: Maximal assistance;Sit to/from stand   Toilet Transfer: Contact guard assist;BSC/3in1;Rolling walker (2 wheels)   Toileting- Clothing Manipulation and Hygiene: Moderate assistance;Sit to/from stand         General ADL Comments: Pt set  up/sitting for UB ADLs, needs help with socks/shoes at baseline, currently max A for LB ADLs. Pt CGA from elevated surface for STS, increased time/effort, painful to LLE with WB.     Vision Baseline Vision/History: 1 Wears glasses Ability to See in Adequate Light: 0 Adequate Patient Visual Report: No change from baseline       Perception         Praxis         Pertinent Vitals/Pain       Extremity/Trunk Assessment Upper Extremity Assessment Upper Extremity Assessment: Generalized weakness           Communication Communication Communication: No apparent difficulties   Cognition Arousal: Alert Behavior During Therapy: WFL for tasks assessed/performed Cognition: No apparent impairments             OT - Cognition Comments: Pt grossly WFLs, very conversational and detailed, aware of precautions                 Following commands: Intact       Cueing  General Comments   Cueing Techniques: Verbal cues      Exercises     Shoulder Instructions      Home Living Family/patient expects to be discharged to:: Private residence Living Arrangements: Spouse/significant other Available Help at Discharge: Family;Available PRN/intermittently Type of Home: House Home Access: Stairs to enter [employer] of Steps: 3 STE front entry with R ledge vs rail, planning to install rail Entrance Stairs-Rails: Can reach both Home Layout: Multi-level;Able to live on main level with bedroom/bathroom Alternate Level Stairs-Number of Steps: 2 steps down into kitchen/living room with railing. main level has bedroom/bathroom and an upstairs that pt does not have to use   Bathroom Shower/Tub: Walk-in shower;Door   Bathroom Toilet: Standard   How Accessible: Accessible via walker Home Equipment: Cane - single point;Rollator (4 wheels);Rolling Walker (2 wheels);Shower seat;Hand held shower head;Toilet riser;Grab bars - toilet;Grab bars - tub/shower           Prior Functioning/Environment Prior Level of Function : Needs assist             Mobility Comments: using RW at home from recent discharge from CIR 3/23 ADLs Comments: wife helps with sock/shoes, sponge bathing    OT Problem List: Decreased strength;Decreased activity tolerance;Impaired balance (sitting and/or standing);Pain   OT Treatment/Interventions: Self-care/ADL training;Therapeutic exercise;Energy conservation;DME and/or AE instruction;Therapeutic activities;Patient/family education;Balance training      OT Goals(Current goals can be found in the care plan section)   Acute Rehab OT Goals Patient Stated Goal: to manage pain OT Goal Formulation: With patient/family Time For Goal Achievement: 07/31/23 Potential to Achieve Goals: Good   OT Frequency:  Min 2X/week    Co-evaluation PT/OT/SLP Co-Evaluation/Treatment: Yes Reason for Co-Treatment: Complexity of the patient's impairments (multi-system involvement);Necessary to address cognition/behavior during functional activity;For patient/therapist safety;To address functional/ADL transfers   OT goals addressed during session: ADL's and self-care;Proper use of Adaptive equipment and DME      AM-PAC OT "6 Clicks" Daily Activity     Outcome Measure Help from another person eating meals?: None Help from another person taking care of personal grooming?: A Little Help from another person toileting, which includes using toliet, bedpan, or  urinal?: A Lot Help from another person bathing (including washing, rinsing, drying)?: A Lot Help from another person to put on and taking off regular upper body clothing?: A Little Help from another person to put on and taking off regular lower body clothing?: A Lot 6 Click Score: 16   End of Session Equipment Utilized During Treatment: Gait belt;Rolling walker (2 wheels) Nurse Communication: Mobility status  Activity Tolerance: Patient tolerated treatment well Patient left: in  chair;with call bell/phone within reach;with family/visitor present  OT Visit Diagnosis: Unsteadiness on feet (R26.81);Other abnormalities of gait and mobility (R26.89);Muscle weakness (generalized) (M62.81);Pain Pain - Right/Left: Left Pain - part of body: Ankle and joints of foot;Leg                Time: 4098-1191 OT Time Calculation (min): 34 min Charges:  OT General Charges $OT Visit: 1 Visit OT Evaluation $OT Eval Moderate Complexity: 1 [address], OTR/L   Scherry Curtis 07/17/2023, 12:39 PM

## 2023-07-18 DIAGNOSIS — A419 Sepsis, unspecified organism: Secondary | ICD-10-CM | POA: Diagnosis not present

## 2023-07-18 LAB — GLUCOSE, CAPILLARY
Glucose-Capillary: 108 mg/dL — ABNORMAL HIGH (ref 70–99)
Glucose-Capillary: 153 mg/dL — ABNORMAL HIGH (ref 70–99)
Glucose-Capillary: 136 mg/dL — ABNORMAL HIGH (ref 70–99)
Glucose-Capillary: 171 mg/dL — ABNORMAL HIGH (ref 70–99)

## 2023-07-18 NOTE — Progress Notes (Signed)
 PROGRESS NOTE    Logan French  ZOX:[id number] DOB: 1934/09/10 DOA: 07/15/2023 PCP: [name], MD   Brief Narrative:  Logan French is a 89 y.o. male with medical history significant of atrial fibrillation with cardioversion 10/07/2021 per Dr.Tobb, hyperlipidemia, syncope hypertension pernicious anemia prostate cancer, peripheral vascular disease status post angioplasty of the right popliteal artery 05/17/2023 per Dr. Susi Eric.  06/03/2023 with right leg cellulitis and wound s/p abx which he seems to have completed without complication. He was discharged from rehab facility on 07/07/2023.  He presents to our facility with worsening left foot infection with ulceration, disorientation.  Hospitalist called for admission, vascular called in consult.  Assessment & Plan:   Principal Problem:   Sepsis due to undetermined organism [facility]) Active Problems:   Cellulitis   Peripheral arterial disease (HCC)   Atrial fibrillation, chronic (HCC)   Dyslipidemia   BPH (benign prostatic hyperplasia)   HTN (hypertension)   DM (diabetes mellitus) (HCC)  Severe sepsis due to undetermined organism (HCC) Lactic acidosis Cellulitis LLE -Imaging not remarkable for deep fluid pocket or abscess  - Vascular following, plan for angiogram Monday, 07/19/2023 - Cultures pending - Continue ceftriaxone , vancomycin  for coverage - Tachypnea, leukocytosis with notable source of cellulitis and mental status changes/lactic acidosis and endorgan damage meeting criteria for severe sepsis   Peripheral arterial disease (HCC) -Longstanding, previously evaluated by vascular surgery with right sided evaluation March 25 - Plan for left-sided evaluation on Monday as above - Continue Plavix , Eliquis  (defer to vascular if this needs to be held prior to procedure)  DM (diabetes mellitus) (HCC), uncontrolled with hyperglycemia -Prior A1c 6.4 - Continue sliding scale insulin , hypoglycemic protocol, home metformin  on  hold  HTN (hypertension) Holding meds, initially hypotensive, now well-controlled without requiring further intervention   BPH (benign prostatic hyperplasia) Continue finasteride    Dyslipidemia Continue rosuvastatin , ezetimibe    Atrial fibrillation, chronic (HCC) C/w apixabna. Hold metoprolol  given severe sepsis for now. Restart in AM  DVT prophylaxis:  apixaban  (ELIQUIS ) tablet 5 mg  Code Status:   Code Status: Full Code Family Communication: None present  Status is: Inpatient  Dispo: The patient is from: Home              Anticipated d/c is to: To be determined              Anticipated d/c date is: 72+ hours              Patient currently not medically stable for discharge given need for ongoing antibiotics and vascular intervention on Monday  Consultants:  Vascular surgery  Procedures:  Planned vascular procedure Monday, 07/19/2023  Antimicrobials:  Ceftriaxone , vancomycin    Subjective: No acute issues/events overnight - denies chest pain, shortness of breath, headache, fevers, chills  Objective: Vitals:   07/17/23 1623 07/17/23 1953 07/18/23 0600 07/18/23 0743  BP: 138/78 113/70 137/86 127/82  Pulse: 83 84  89  Resp: 18 16 15 18   Temp: 98.6 F (37 C) 98.4 F (36.9 C) 98.3 F (36.8 C) 98.4 F (36.9 C)  TempSrc: Oral  Oral Oral  SpO2: 94% 100% 100% 98%  Weight:      Height:        Intake/Output Summary (Last 24 hours) at 07/18/2023 0810 Last data filed at 07/18/2023 0526 Gross per 24 hour  Intake 560 ml  Output 1250 ml  Net -690 ml   Filed Weights   07/15/23 2125  Weight: 101.6 kg    Examination:  General:  Pleasantly resting in bed, No acute distress. HEENT:  Normocephalic atraumatic.  Sclerae nonicteric, noninjected.  Extraocular movements intact bilaterally. Neck:  Without mass or deformity.  Trachea is midline. Lungs:  Clear to auscultate bilaterally without rhonchi, wheeze, or rales. Heart:  Regular rate and rhythm.  Without murmurs, rubs, or  gallops. Abdomen:  Soft, nontender, nondistended.  Without guarding or rebound. Extremities: Bilateral lower extremity bandages clean dry intact  Data Reviewed: I have personally reviewed following labs and imaging studies  CBC: Recent Labs  Lab 07/15/23 1829 07/16/23 0906 07/17/23 0625  WBC 15.3* 15.6* 16.1*  NEUTROABS 13.7*  --   --   HGB 11.7* 11.1* 11.4*  HCT 36.3* 34.6* 36.9*  MCV 88.5 87.6 92.0  PLT 212 167 148*   Basic Metabolic Panel: Recent Labs  Lab 07/15/23 1829 07/16/23 0906 07/17/23 0625  NA 132* 133* 132*  K 3.6 3.7 4.1  CL 97* 99 99  CO2 23 21* 23  GLUCOSE 151* 141* 105*  BUN 20 18 18   CREATININE 1.26* 1.19 0.95  CALCIUM  8.3* 8.0* 8.0*  PHOS 2.3*  --   --    GFR: Estimated Creatinine Clearance: 68.8 mL/min (by C-G formula based on SCr of 0.95 mg/dL). Liver Function Tests: Recent Labs  Lab 07/15/23 1829  AST 32  ALT 26  ALKPHOS 82  BILITOT 1.4*  PROT 6.1*  ALBUMIN 2.8*   No results for input(s): "LIPASE", "AMYLASE" in the last 168 hours. No results for input(s): "AMMONIA" in the last 168 hours. Coagulation Profile: Recent Labs  Lab 07/16/23 0906  INR 2.1*   Cardiac Enzymes: Recent Labs  Lab 07/15/23 1829  CKTOTAL 54   BNP (last 3 results) No results for input(s): "PROBNP" in the last 8760 hours. HbA1C: No results for input(s): "HGBA1C" in the last 72 hours. CBG: Recent Labs  Lab 07/17/23 0806 07/17/23 1144 07/17/23 1621 07/17/23 2200 07/18/23 0742  GLUCAP 111* 123* 103* 149* 108*   Lipid Profile: No results for input(s): "CHOL", "HDL", "LDLCALC", "TRIG", "CHOLHDL", "LDLDIRECT" in the last 72 hours. Thyroid  Function Tests: No results for input(s): "TSH", "T4TOTAL", "FREET4", "T3FREE", "THYROIDAB" in the last 72 hours. Anemia Panel: No results for input(s): "VITAMINB12", "FOLATE", "FERRITIN", "TIBC", "IRON", "RETICCTPCT" in the last 72 hours. Sepsis Labs: Recent Labs  Lab 07/15/23 1835 07/15/23 2110  LATICACIDVEN 3.7*  3.6*    Recent Results (from the past 240 hours)  Blood culture (routine x 2)     Status: None (Preliminary result)   Collection Time: 07/15/23  6:29 PM   Specimen: BLOOD LEFT ARM  Result Value Ref Range Status   Specimen Description   Final    BLOOD LEFT ARM Performed at [facility], [address], [place], [place] [address]    Special Requests   Final    BOTTLES DRAWN AEROBIC AND ANAEROBIC Blood Culture results may not be optimal due to an inadequate volume of blood received in culture bottles Performed at [facility], [address], [place], [place] [address]    Culture   Final    NO GROWTH 3 DAYS Performed at [facility], [address], [place], [place] [address]    Report Status PENDING  Incomplete  Blood culture (routine x 2)     Status: None (Preliminary result)   Collection Time: 07/15/23  7:50 PM   Specimen: BLOOD RIGHT ARM  Result Value Ref Range Status   Specimen Description   Final  BLOOD RIGHT ARM Performed at [facility], [address] [place], [place] [address]    Special Requests   Final    BOTTLES DRAWN AEROBIC AND ANAEROBIC Blood Culture adequate volume Performed at [facility], [address] [place], [place] [address]    Culture   Final    NO GROWTH 3 DAYS Performed at [facility], [address], [place], [place] [address]    Report Status PENDING  Incomplete         Radiology Studies: No results found.  Scheduled Meds:  apixaban   5 mg Oral BID   clopidogrel   75 mg Oral Daily   ezetimibe   10 mg Oral Daily   finasteride   5 mg Oral Daily   insulin  aspart  0-5 Units Subcutaneous QHS   insulin  aspart  0-9 Units Subcutaneous TID WC   pantoprazole   40 mg Oral Daily   rosuvastatin   20 mg Oral Daily   senna-docusate  2 tablet Oral BID   sodium chloride  flush  3 mL Intravenous Q12H   Continuous Infusions:  cefTRIAXone  (ROCEPHIN )  IV 2 g (07/17/23 1343)    vancomycin  1,500 mg (07/17/23 1714)     LOS: 3 days   Time spent:  Haydee Lipa, DO [facility]  If 7PM-7AM, please contact night-coverage [email or web address]  07/18/2023, 8:10 AM

## 2023-07-18 NOTE — Progress Notes (Signed)
  Daily Progress Note  LLE angio planned for tomorrow in cath lab NPO midnight Consent ordered   Philipp Brawn MD [facility] [phone] 07/18/2023  9:32 AM

## 2023-07-19 DIAGNOSIS — A419 Sepsis, unspecified organism: Secondary | ICD-10-CM | POA: Diagnosis not present

## 2023-07-19 DIAGNOSIS — S91302A Unspecified open wound, left foot, initial encounter: Secondary | ICD-10-CM

## 2023-07-19 LAB — GLUCOSE, CAPILLARY
Glucose-Capillary: 111 mg/dL — ABNORMAL HIGH (ref 70–99)
Glucose-Capillary: 124 mg/dL — ABNORMAL HIGH (ref 70–99)
Glucose-Capillary: 142 mg/dL — ABNORMAL HIGH (ref 70–99)
Glucose-Capillary: 155 mg/dL — ABNORMAL HIGH (ref 70–99)

## 2023-07-19 LAB — CBC
HCT: 35.1 % — ABNORMAL LOW (ref 39.0–52.0)
Hemoglobin: 11.6 g/dL — ABNORMAL LOW (ref 13.0–17.0)
MCH: 28.8 pg (ref 26.0–34.0)
MCHC: 33 g/dL (ref 30.0–36.0)
MCV: 87.1 fL (ref 80.0–100.0)
Platelets: 205 10*3/uL (ref 150–400)
RBC: 4.03 MIL/uL — ABNORMAL LOW (ref 4.22–5.81)
RDW: 17 % — ABNORMAL HIGH (ref 11.5–15.5)
WBC: 15 10*3/uL — ABNORMAL HIGH (ref 4.0–10.5)
nRBC: 0 % (ref 0.0–0.2)

## 2023-07-19 LAB — VANCOMYCIN, TROUGH: Vancomycin Tr: 9 ug/mL — ABNORMAL LOW (ref 15–20)

## 2023-07-19 LAB — BASIC METABOLIC PANEL WITH GFR
Anion gap: 9 (ref 5–15)
BUN: 11 mg/dL (ref 8–23)
CO2: 24 mmol/L (ref 22–32)
Calcium: 7.9 mg/dL — ABNORMAL LOW (ref 8.9–10.3)
Chloride: 101 mmol/L (ref 98–111)
Creatinine, Ser: 0.76 mg/dL (ref 0.61–1.24)
GFR, Estimated: >60 mL/min (ref >60)
Glucose, Bld: 114 mg/dL — ABNORMAL HIGH (ref 70–99)
Potassium: 3.6 mmol/L (ref 3.5–5.1)
Sodium: 134 mmol/L — ABNORMAL LOW (ref 135–145)

## 2023-07-19 LAB — VANCOMYCIN, PEAK: Vancomycin Pk: 36 ug/mL (ref 30–40)

## 2023-07-19 NOTE — Progress Notes (Signed)
 PROGRESS NOTE    Belmont Bethany  ZOX:[id number] DOB: 1935-01-24 DOA: 07/15/2023 PCP: [name], MD   Brief Narrative:  Logan French is a 89 y.o. male with medical history significant of atrial fibrillation with cardioversion 10/07/2021 per Dr.Tobb, hyperlipidemia, syncope hypertension pernicious anemia prostate cancer, peripheral vascular disease status post angioplasty of the right popliteal artery 05/17/2023 per Dr. Susi Eric.  06/03/2023 with right leg cellulitis and wound s/p abx which he seems to have completed without complication. He was discharged from rehab facility on 07/07/2023.  He presents to our facility with worsening left foot infection with ulceration, disorientation.  Hospitalist called for admission, vascular called in consult.  Assessment & Plan:   Principal Problem:   Sepsis due to undetermined organism [facility]) Active Problems:   Cellulitis   Peripheral arterial disease (HCC)   Atrial fibrillation, chronic (HCC)   Dyslipidemia   BPH (benign prostatic hyperplasia)   HTN (hypertension)   DM (diabetes mellitus) (HCC)  Severe sepsis due to undetermined organism (HCC) Lactic acidosis Cellulitis LLE -Imaging not remarkable for deep fluid pocket or abscess  - Vascular following, plan for angiogram Monday, 07/19/2023 - Cultures pending - Continue ceftriaxone , vancomycin  for coverage - Tachypnea, leukocytosis with notable source of cellulitis and mental status changes/lactic acidosis and endorgan damage meeting criteria for severe sepsis   Peripheral arterial disease (HCC) -Longstanding, previously evaluated by vascular surgery with right sided evaluation March 25 - Plan for left-sided evaluation 07/20/2023 - Continue Plavix , Eliquis  on hold  DM (diabetes mellitus) (HCC), uncontrolled with hyperglycemia -Prior A1c 6.4 - Continue sliding scale insulin , hypoglycemic protocol, home metformin  on hold  HTN (hypertension) Holding meds, initially hypotensive, now  well-controlled without requiring further intervention   BPH (benign prostatic hyperplasia) Continue finasteride    Dyslipidemia Continue rosuvastatin , ezetimibe    Atrial fibrillation, chronic (HCC) C/w apixabna. Hold metoprolol  given severe sepsis for now. Restart in AM  DVT prophylaxis:   Code Status:   Code Status: Full Code Family Communication: None present  Status is: Inpatient  Dispo: The patient is from: Home              Anticipated d/c is to: To be determined              Anticipated d/c date is: 72+ hours              Patient currently not medically stable for discharge given need for ongoing antibiotics and vascular intervention on Monday  Consultants:  Vascular surgery  Procedures:  Planned vascular procedure Monday, 07/19/2023  Antimicrobials:  Ceftriaxone , vancomycin    Subjective: No acute issues/events overnight - denies chest pain, shortness of breath, headache, fevers, chills  Objective: Vitals:   07/18/23 1957 07/19/23 0510 07/19/23 0643 07/19/23 0724  BP: 139/77 (!) 167/115 (!) 154/89 139/83  Pulse: 85 77 86 89  Resp: 18 18    Temp: 98.3 F (36.8 C) 98.4 F (36.9 C)  98.4 F (36.9 C)  TempSrc: Oral Oral  Oral  SpO2: 96% 95%  96%  Weight:      Height:        Intake/Output Summary (Last 24 hours) at 07/19/2023 1358 Last data filed at 07/19/2023 0005 Gross per 24 hour  Intake 3 ml  Output 700 ml  Net -697 ml   Filed Weights   07/15/23 2125  Weight: 101.6 kg    Examination:  General:  Pleasantly resting in bed, No acute distress. HEENT:  Normocephalic atraumatic.  Sclerae nonicteric, noninjected.  Extraocular  movements intact bilaterally. Neck:  Without mass or deformity.  Trachea is midline. Lungs:  Clear to auscultate bilaterally without rhonchi, wheeze, or rales. Heart:  Regular rate and rhythm.  Without murmurs, rubs, or gallops. Abdomen:  Soft, nontender, nondistended.  Without guarding or rebound. Extremities: Bilateral lower  extremity bandages clean dry intact  Data Reviewed: I have personally reviewed following labs and imaging studies  CBC: Recent Labs  Lab 07/15/23 1829 07/16/23 0906 07/17/23 0625 07/19/23 0716  WBC 15.3* 15.6* 16.1* 15.0*  NEUTROABS 13.7*  --   --   --   HGB 11.7* 11.1* 11.4* 11.6*  HCT 36.3* 34.6* 36.9* 35.1*  MCV 88.5 87.6 92.0 87.1  PLT 212 167 148* 205   Basic Metabolic Panel: Recent Labs  Lab 07/15/23 1829 07/16/23 0906 07/17/23 0625 07/19/23 0716  NA 132* 133* 132* 134*  K 3.6 3.7 4.1 3.6  CL 97* 99 99 101  CO2 23 21* 23 24  GLUCOSE 151* 141* 105* 114*  BUN 20 18 18 11   CREATININE 1.26* 1.19 0.95 0.76  CALCIUM  8.3* 8.0* 8.0* 7.9*  PHOS 2.3*  --   --   --    GFR: Estimated Creatinine Clearance: 81.8 mL/min (by C-G formula based on SCr of 0.76 mg/dL). Liver Function Tests: Recent Labs  Lab 07/15/23 1829  AST 32  ALT 26  ALKPHOS 82  BILITOT 1.4*  PROT 6.1*  ALBUMIN 2.8*   No results for input(s): "LIPASE", "AMYLASE" in the last 168 hours. No results for input(s): "AMMONIA" in the last 168 hours. Coagulation Profile: Recent Labs  Lab 07/16/23 0906  INR 2.1*   Cardiac Enzymes: Recent Labs  Lab 07/15/23 1829  CKTOTAL 54   BNP (last 3 results) No results for input(s): "PROBNP" in the last 8760 hours. HbA1C: No results for input(s): "HGBA1C" in the last 72 hours. CBG: Recent Labs  Lab 07/18/23 1134 07/18/23 1616 07/18/23 2217 07/19/23 0639 07/19/23 1146  GLUCAP 153* 136* 171* 111* 124*   Lipid Profile: No results for input(s): "CHOL", "HDL", "LDLCALC", "TRIG", "CHOLHDL", "LDLDIRECT" in the last 72 hours. Thyroid  Function Tests: No results for input(s): "TSH", "T4TOTAL", "FREET4", "T3FREE", "THYROIDAB" in the last 72 hours. Anemia Panel: No results for input(s): "VITAMINB12", "FOLATE", "FERRITIN", "TIBC", "IRON", "RETICCTPCT" in the last 72 hours. Sepsis Labs: Recent Labs  Lab 07/15/23 1835 07/15/23 2110  LATICACIDVEN 3.7* 3.6*     Recent Results (from the past 240 hours)  Blood culture (routine x 2)     Status: None (Preliminary result)   Collection Time: 07/15/23  6:29 PM   Specimen: BLOOD LEFT ARM  Result Value Ref Range Status   Specimen Description   Final    BLOOD LEFT ARM Performed at [facility], [address] [place], [place] [address]    Special Requests   Final    BOTTLES DRAWN AEROBIC AND ANAEROBIC Blood Culture results may not be optimal due to an inadequate volume of blood received in culture bottles Performed at [facility], [address], [place], [place] [address]    Culture   Final    NO GROWTH 4 DAYS Performed at [facility], [address], [place], [place] [address]    Report Status PENDING  Incomplete  Blood culture (routine x 2)     Status: None (Preliminary result)   Collection Time: 07/15/23  7:50 PM   Specimen: BLOOD RIGHT ARM  Result Value Ref Range Status   Specimen Description  Final    BLOOD RIGHT ARM Performed at [facility], [address], [place], [place] [address]    Special Requests   Final    BOTTLES DRAWN AEROBIC AND ANAEROBIC Blood Culture adequate volume Performed at [facility], [address] [place], [place] [address]    Culture   Final    NO GROWTH 4 DAYS Performed at [facility], [address] [place], [place] [address]    Report Status PENDING  Incomplete         Radiology Studies: No results found.  Scheduled Meds:  clopidogrel   75 mg Oral Daily   ezetimibe   10 mg Oral Daily   finasteride   5 mg Oral Daily   insulin  aspart  0-5 Units Subcutaneous QHS   insulin  aspart  0-9 Units Subcutaneous TID WC   pantoprazole   40 mg Oral Daily   rosuvastatin   20 mg Oral Daily   senna-docusate  2 tablet Oral BID   sodium chloride  flush  3 mL Intravenous Q12H   Continuous Infusions:  cefTRIAXone  (ROCEPHIN )  IV 2 g (07/18/23 1256)   vancomycin  1,500 mg (07/18/23  1831)     LOS: 4 days   Time spent:  Haydee Lipa, DO [facility]  If 7PM-7AM, please contact night-coverage [email or web address]  07/19/2023, 1:58 PM

## 2023-07-19 NOTE — Progress Notes (Signed)
  Progress Note    07/19/2023 9:07 AM  Subjective:  no complaints   Vitals:   07/19/23 0643 07/19/23 0724  BP: (!) 154/89 139/83  Pulse: 86 89  Resp:    Temp:  98.4 F (36.9 C)  SpO2:  96%   Physical Exam: Lungs:  non labored Extremities:  L foot and achillis wounds Neurologic: A&O  CBC    Component Value Date/Time   WBC 15.0 (H) 07/19/2023 0716   RBC 4.03 (L) 07/19/2023 0716   HGB 11.6 (L) 07/19/2023 0716   HGB 13.4 09/08/2021 1027   HCT 35.1 (L) 07/19/2023 0716   HCT 41.2 09/08/2021 1027   PLT 205 07/19/2023 0716   PLT 270 09/08/2021 1027   MCV 87.1 07/19/2023 0716   MCV 85 09/08/2021 1027   MCH 28.8 07/19/2023 0716   MCHC 33.0 07/19/2023 0716   RDW 17.0 (H) 07/19/2023 0716   RDW 14.1 09/08/2021 1027   LYMPHSABS 0.7 07/15/2023 1829   MONOABS 0.8 07/15/2023 1829   EOSABS 0.0 07/15/2023 1829   BASOSABS 0.0 07/15/2023 1829    BMET    Component Value Date/Time   NA 134 (L) 07/19/2023 0716   NA 142 09/08/2021 1027   K 3.6 07/19/2023 0716   CL 101 07/19/2023 0716   CO2 24 07/19/2023 0716   GLUCOSE 114 (H) 07/19/2023 0716   BUN 11 07/19/2023 0716   BUN 23 09/08/2021 1027   CREATININE 0.76 07/19/2023 0716   CALCIUM  7.9 (L) 07/19/2023 0716   GFRNONAA >60 07/19/2023 0716   GFRAA 67 07/14/2019 1020    INR    Component Value Date/Time   INR 2.1 (H) 07/16/2023 0906     Intake/Output Summary (Last 24 hours) at 07/19/2023 0907 Last data filed at 07/19/2023 0005 Gross per 24 hour  Intake 3 ml  Output 700 ml  Net -697 ml     Assessment/Plan:  89 y.o. male with L foot and achillis wounds  Patient received a dose of Eliquis  overnight.  We will reschedule LLE angiogram to tomorrow 5/6 with Dr. Charlotte Cookey.  Gisele Lamas to eat today.  He will be npo past midnight.  Consent on paper chart   Cordie Deters, PA-C [facility] [phone] 07/19/2023 9:07 AM

## 2023-07-19 NOTE — Plan of Care (Signed)
  Problem: Coping: Goal: Ability to adjust to condition or change in health will improve Outcome: Progressing   Problem: Nutritional: Goal: Maintenance of adequate nutrition will improve Outcome: Progressing   Problem: Education: Goal: Knowledge of General Education information will improve Description: Including pain rating scale, medication(s)/side effects and non-pharmacologic comfort measures Outcome: Progressing

## 2023-07-19 NOTE — Care Management Important Message (Signed)
 Important Message  Patient Details  Name: Logan French MRN: [id number] Date of Birth: 1934-08-09   Important Message Given:  Yes - Medicare IM     Felix Host 07/19/2023, 3:58 PM

## 2023-07-19 NOTE — Progress Notes (Signed)
 Pharmacy Antibiotic Note  Logan French is a 89 y.o. male admitted on 07/15/2023 with  wound infection .  Pharmacy has been consulted for vancomycin  dosing.  Cr remains stable, vancomycin  levels demonstrate therapeutic AUC of 471.   Plan: Continue vancomycin  1500mg  IV q24h Follow Cr  Height: 6' (182.9 cm) Weight: 101.6 kg (223 lb 15.8 oz) IBW/kg (Calculated) : 77.6  Temp (24hrs), Avg:98.8 F (37.1 C), Min:98.4 F (36.9 C), Max:99.7 F (37.6 C)  Recent Labs  Lab 07/15/23 1829 07/15/23 1835 07/15/23 2110 07/16/23 0906 07/17/23 0625 07/19/23 0716 07/19/23 1654 07/19/23 2052  WBC 15.3*  --   --  15.6* 16.1* 15.0*  --   --   CREATININE 1.26*  --   --  1.19 0.95 0.76  --   --   LATICACIDVEN  --  3.7* 3.6*  --   --   --   --   --   VANCOTROUGH  --   --   --   --   --   --  9*  --   VANCOPEAK  --   --   --   --   --   --   --  36    Estimated Creatinine Clearance: 81.8 mL/min (by C-G formula based on SCr of 0.76 mg/dL).    No Known Allergies   Levin Reamer, PharmD, BCPS, [facility] Clinical Pharmacist [phone] Please check AMION for all [facility] Pharmacy numbers 07/19/2023

## 2023-07-20 ENCOUNTER — Encounter (HOSPITAL_COMMUNITY)
Admission: EM | Disposition: A | Discharge status: Rehabilitation Facility | Payer: Self-pay | Source: Home / Self Care | Attending: Internal Medicine

## 2023-07-20 ENCOUNTER — Ambulatory Visit: Admitting: Orthopedic Surgery

## 2023-07-20 DIAGNOSIS — I70242 Atherosclerosis of native arteries of left leg with ulceration of calf: Secondary | ICD-10-CM

## 2023-07-20 DIAGNOSIS — A419 Sepsis, unspecified organism: Secondary | ICD-10-CM | POA: Diagnosis not present

## 2023-07-20 DIAGNOSIS — L03116 Cellulitis of left lower limb: Secondary | ICD-10-CM

## 2023-07-20 HISTORY — PX: LOWER EXTREMITY ANGIOGRAPHY: CATH118251

## 2023-07-20 HISTORY — PX: ABDOMINAL AORTOGRAM: CATH118222

## 2023-07-20 HISTORY — PX: PERIPHERAL INTRAVASCULAR LITHOTRIPSY: CATH118324

## 2023-07-20 HISTORY — PX: LOWER EXTREMITY INTERVENTION: CATH118252

## 2023-07-20 LAB — CULTURE, BLOOD (ROUTINE X 2)
Culture: NO GROWTH
Culture: NO GROWTH
Special Requests: ADEQUATE

## 2023-07-20 LAB — GLUCOSE, CAPILLARY
Glucose-Capillary: 102 mg/dL — ABNORMAL HIGH (ref 70–99)
Glucose-Capillary: 92 mg/dL (ref 70–99)
Glucose-Capillary: 81 mg/dL (ref 70–99)
Glucose-Capillary: 167 mg/dL — ABNORMAL HIGH (ref 70–99)

## 2023-07-20 SURGERY — ABDOMINAL AORTOGRAM
Anesthesia: LOCAL

## 2023-07-20 MED ORDER — MIDAZOLAM HCL 2 MG/2ML IJ SOLN
INTRAMUSCULAR | Status: DC | PRN
  Administered 2023-07-20 (×3): 1 mg via INTRAVENOUS

## 2023-07-20 MED ORDER — ONDANSETRON HCL 4 MG/2ML IJ SOLN: 4.0000 mg | Freq: Four times a day (QID) | INTRAMUSCULAR | Status: DC | PRN

## 2023-07-20 MED ORDER — SODIUM CHLORIDE 0.9% FLUSH: 3.0000 mL | INTRAVENOUS | Status: DC | PRN

## 2023-07-20 MED ORDER — HEPARIN (PORCINE) IN NACL 2000-0.9 UNIT/L-% IV SOLN
INTRAVENOUS | Status: DC | PRN
  Administered 2023-07-20: 1000 mL

## 2023-07-20 MED ORDER — FENTANYL CITRATE (PF) 100 MCG/2ML IJ SOLN
INTRAMUSCULAR | Status: AC
  Filled 2023-07-20: qty 2

## 2023-07-20 MED ORDER — MIDAZOLAM HCL 2 MG/2ML IJ SOLN
INTRAMUSCULAR | Status: AC
  Filled 2023-07-20: qty 2

## 2023-07-20 MED ORDER — SODIUM CHLORIDE 0.9 % IV SOLN: 250.0000 mL | INTRAVENOUS | Status: AC | PRN

## 2023-07-20 MED ORDER — SODIUM CHLORIDE 0.9 % WEIGHT BASED INFUSION
1.0000 mL/kg/h | INTRAVENOUS | Status: AC
  Administered 2023-07-20: 1 mL/kg/h via INTRAVENOUS

## 2023-07-20 MED ORDER — LIDOCAINE HCL (PF) 1 % IJ SOLN
INTRAMUSCULAR | Status: AC
  Filled 2023-07-20: qty 30

## 2023-07-20 MED ORDER — SODIUM CHLORIDE 0.9% FLUSH
3.0000 mL | Freq: Two times a day (BID) | INTRAVENOUS | Status: DC
  Administered 2023-07-21 (×2): 3 mL via INTRAVENOUS

## 2023-07-20 MED ORDER — HEPARIN SODIUM (PORCINE) 1000 UNIT/ML IJ SOLN
INTRAMUSCULAR | Status: DC | PRN
Start: 2023-07-20 — End: 2023-07-20
  Administered 2023-07-20: 10000 [IU] via INTRAVENOUS

## 2023-07-20 MED ORDER — CLOPIDOGREL BISULFATE 75 MG PO TABS
75.0000 mg | ORAL_TABLET | Freq: Every day | ORAL | Status: DC
  Administered 2023-07-21 – 2023-07-27 (×7): 75 mg via ORAL
  Filled 2023-07-20 (×7): qty 1

## 2023-07-20 MED ORDER — IODIXANOL 320 MG/ML IV SOLN
INTRAVENOUS | Status: DC | PRN
Start: 2023-07-20 — End: 2023-07-20
  Administered 2023-07-20: 85 mL

## 2023-07-20 MED ORDER — FENTANYL CITRATE (PF) 100 MCG/2ML IJ SOLN
INTRAMUSCULAR | Status: DC | PRN
  Administered 2023-07-20: 50 ug via INTRAVENOUS
  Administered 2023-07-20: 25 ug via INTRAVENOUS

## 2023-07-20 MED ORDER — LABETALOL HCL 5 MG/ML IV SOLN: 10.0000 mg | INTRAVENOUS | Status: DC | PRN

## 2023-07-20 MED ORDER — LIDOCAINE HCL (PF) 1 % IJ SOLN
INTRAMUSCULAR | Status: DC | PRN
  Administered 2023-07-20: 15 mL

## 2023-07-20 MED ORDER — HEPARIN SODIUM (PORCINE) 1000 UNIT/ML IJ SOLN
INTRAMUSCULAR | Status: AC
  Filled 2023-07-20: qty 10

## 2023-07-20 MED ORDER — ACETAMINOPHEN 325 MG PO TABS
650.0000 mg | ORAL_TABLET | ORAL | Status: DC | PRN
  Administered 2023-07-22 – 2023-07-26 (×7): 650 mg via ORAL
  Filled 2023-07-20 (×7): qty 2

## 2023-07-20 MED ORDER — HYDRALAZINE HCL 20 MG/ML IJ SOLN: 5.0000 mg | INTRAMUSCULAR | Status: DC | PRN

## 2023-07-20 SURGICAL SUPPLY — 14 items
CATH OMNI FLUSH 5F 65CM (CATHETERS) IMPLANT
CATH SHOCKWAVE E8 5X80 (CATHETERS) IMPLANT
COVER DOME SNAP 22 D (MISCELLANEOUS) IMPLANT
DEVICE VASC CLSR CELT ART 6 (Vascular Products) IMPLANT
KIT ENCORE 26 ADVANTAGE (KITS) IMPLANT
KIT MICROPUNCTURE NIT STIFF (SHEATH) IMPLANT
SET ATX-X65L (MISCELLANEOUS) IMPLANT
SHEATH CATAPULT 6FR 45 (SHEATH) IMPLANT
SHEATH PINNACLE 5F 10CM (SHEATH) IMPLANT
SHEATH PINNACLE 6F 10CM (SHEATH) IMPLANT
SHEATH PROBE COVER 6X72 (BAG) IMPLANT
TRAY PV CATH (CUSTOM PROCEDURE TRAY) ×2 IMPLANT
WIRE BENTSON .035X145CM (WIRE) IMPLANT
WIRE SPARTACORE .014X300CM (WIRE) IMPLANT

## 2023-07-20 NOTE — Interval H&P Note (Signed)
 History and Physical Interval Note:  07/20/2023 11:10 AM  Logan French  has presented today for surgery, with the diagnosis of cellulitits.  The various methods of treatment have been discussed with the patient and family. After consideration of risks, benefits and other options for treatment, the patient has consented to  Procedure(s): ABDOMINAL AORTOGRAM (N/A) Lower Extremity Angiography (N/A) LOWER EXTREMITY INTERVENTION (N/A) as a surgical intervention.  The patient's history has been reviewed, patient examined, no change in status, stable for surgery.  I have reviewed the patient's chart and labs.  Questions were answered to the patient's satisfaction.     Gareld June

## 2023-07-20 NOTE — Progress Notes (Signed)
 PROGRESS NOTE    [name]  AVW:[id number] DOB: 1934-09-16 DOA: 07/15/2023 PCP: [name], MD   Brief Narrative:  Logan French is a 89 y.o. male with medical history significant of atrial fibrillation with cardioversion 10/07/2021 per Dr.Tobb, hyperlipidemia, syncope hypertension pernicious anemia prostate cancer, peripheral vascular disease status post angioplasty of the right popliteal artery 05/17/2023 per Dr. Susi Eric.  06/03/2023 with right leg cellulitis and wound s/p abx which he seems to have completed without complication. He was discharged from rehab facility on 07/07/2023.  He presents to our facility with worsening left foot infection with ulceration, disorientation.  Hospitalist called for admission, vascular called in consult.  Assessment & Plan:   Principal Problem:   Sepsis due to undetermined organism [facility]) Active Problems:   Cellulitis   Peripheral arterial disease (HCC)   Atrial fibrillation, chronic (HCC)   Dyslipidemia   BPH (benign prostatic hyperplasia)   HTN (hypertension)   DM (diabetes mellitus) (HCC)  Severe sepsis due to undetermined organism (HCC) Lactic acidosis Cellulitis LLE -Imaging not remarkable for deep fluid pocket or abscess  - Vascular following, plan for angiogram 5/6 - Cultures pending - Continue ceftriaxone , vancomycin  for coverage - Tachypnea, leukocytosis with notable source of cellulitis and mental status changes/lactic acidosis and endorgan damage meeting criteria for severe sepsis   Peripheral arterial disease (HCC) -Longstanding, previously evaluated by vascular surgery with right sided evaluation March 25 - Plan for left-sided evaluation 07/20/2023 - Continue Plavix , Eliquis  on hold  DM (diabetes mellitus) (HCC), uncontrolled with hyperglycemia -Prior A1c 6.4 - Continue sliding scale insulin , hypoglycemic protocol, home metformin  on hold  HTN (hypertension) Holding meds, initially hypotensive, now  well-controlled without requiring further intervention   BPH (benign prostatic hyperplasia) Continue finasteride    Dyslipidemia Continue rosuvastatin , ezetimibe    Atrial fibrillation, chronic (HCC) Eliquis  on hold for procedure.  Hold metoprolol  - resume as needed for rate/BP control  DVT prophylaxis:   Code Status:   Code Status: Full Code Family Communication: None present  Status is: Inpatient  Dispo: The patient is from: Home              Anticipated d/c is to: To be determined              Anticipated d/c date is: 72+ hours              Patient currently not medically stable for discharge given need for ongoing antibiotics and vascular intervention on Monday  Consultants:  Vascular surgery  Procedures:  Planned vascular procedure Monday, 07/19/2023  Antimicrobials:  Ceftriaxone , vancomycin    Subjective: No acute issues/events overnight - denies chest pain, shortness of breath, headache, fevers, chills  Objective: Vitals:   07/19/23 0724 07/19/23 1411 07/19/23 2131 07/20/23 0321  BP: 139/83 (!) 146/92 133/72 137/81  Pulse: 89  82 82  Resp:   18 18  Temp: 98.4 F (36.9 C) 99.7 F (37.6 C) 98.6 F (37 C) 98 F (36.7 C)  TempSrc: Oral Oral Oral   SpO2: 96% 98% 95% 95%  Weight:      Height:       No intake or output data in the 24 hours ending 07/20/23 0738  Filed Weights   07/15/23 2125  Weight: 101.6 kg    Examination:  General:  Pleasantly resting in bed, No acute distress. HEENT:  Normocephalic atraumatic.  Sclerae nonicteric, noninjected.  Extraocular movements intact bilaterally. Neck:  Without mass or deformity.  Trachea is midline. Lungs:  Clear  to auscultate bilaterally without rhonchi, wheeze, or rales. Heart:  Regular rate and rhythm.  Without murmurs, rubs, or gallops. Abdomen:  Soft, nontender, nondistended.  Without guarding or rebound. Extremities: Bilateral lower extremity bandages clean dry intact  Data Reviewed: I have personally  reviewed following labs and imaging studies  CBC: Recent Labs  Lab 07/15/23 1829 07/16/23 0906 07/17/23 0625 07/19/23 0716  WBC 15.3* 15.6* 16.1* 15.0*  NEUTROABS 13.7*  --   --   --   HGB 11.7* 11.1* 11.4* 11.6*  HCT 36.3* 34.6* 36.9* 35.1*  MCV 88.5 87.6 92.0 87.1  PLT 212 167 148* 205   Basic Metabolic Panel: Recent Labs  Lab 07/15/23 1829 07/16/23 0906 07/17/23 0625 07/19/23 0716  NA 132* 133* 132* 134*  K 3.6 3.7 4.1 3.6  CL 97* 99 99 101  CO2 23 21* 23 24  GLUCOSE 151* 141* 105* 114*  BUN 20 18 18 11   CREATININE 1.26* 1.19 0.95 0.76  CALCIUM  8.3* 8.0* 8.0* 7.9*  PHOS 2.3*  --   --   --    GFR: Estimated Creatinine Clearance: 81.8 mL/min (by C-G formula based on SCr of 0.76 mg/dL). Liver Function Tests: Recent Labs  Lab 07/15/23 1829  AST 32  ALT 26  ALKPHOS 82  BILITOT 1.4*  PROT 6.1*  ALBUMIN 2.8*   Coagulation Profile: Recent Labs  Lab 07/16/23 0906  INR 2.1*   Cardiac Enzymes: Recent Labs  Lab 07/15/23 1829  CKTOTAL 54   CBG: Recent Labs  Lab 07/19/23 0639 07/19/23 1146 07/19/23 1616 07/19/23 2136 07/20/23 0633  GLUCAP 111* 124* 142* 155* 102*   Sepsis Labs: Recent Labs  Lab 07/15/23 1835 07/15/23 2110  LATICACIDVEN 3.7* 3.6*   Recent Results (from the past 240 hours)  Blood culture (routine x 2)     Status: None (Preliminary result)   Collection Time: 07/15/23  6:29 PM   Specimen: BLOOD LEFT ARM  Result Value Ref Range Status   Specimen Description   Final    BLOOD LEFT ARM Performed at [facility], [address] [place], [place] [address]    Special Requests   Final    BOTTLES DRAWN AEROBIC AND ANAEROBIC Blood Culture results may not be optimal due to an inadequate volume of blood received in culture bottles Performed at [facility], [address], [place], [place] [address]    Culture   Final    NO GROWTH 4 DAYS Performed at [facility], [address] [place], [place]  [address]    Report Status PENDING  Incomplete  Blood culture (routine x 2)     Status: None (Preliminary result)   Collection Time: 07/15/23  7:50 PM   Specimen: BLOOD RIGHT ARM  Result Value Ref Range Status   Specimen Description   Final    BLOOD RIGHT ARM Performed at [facility], [address] [place], [place] [address]    Special Requests   Final    BOTTLES DRAWN AEROBIC AND ANAEROBIC Blood Culture adequate volume Performed at [facility], [address] [place], [place] [address]    Culture   Final    NO GROWTH 4 DAYS Performed at [facility], [address] [place], [place] [address]    Report Status PENDING  Incomplete         Radiology Studies: No results found.  Scheduled Meds:  clopidogrel   75 mg Oral Daily   ezetimibe   10 mg Oral Daily   finasteride   5 mg Oral Daily   insulin  aspart  0-5 Units Subcutaneous QHS   insulin  aspart  0-9 Units Subcutaneous TID WC   pantoprazole   40 mg Oral Daily   rosuvastatin   20 mg Oral Daily   senna-docusate  2 tablet Oral BID   sodium chloride  flush  3 mL Intravenous Q12H   Continuous Infusions:  cefTRIAXone  (ROCEPHIN )  IV 2 g (07/19/23 1428)   vancomycin  1,500 mg (07/19/23 1830)     LOS: 5 days   Time spent:  Haydee Lipa, DO [facility]  If 7PM-7AM, please contact night-coverage [email or web address]  07/20/2023, 7:38 AM

## 2023-07-20 NOTE — Progress Notes (Signed)
 PT Cancellation Note  Patient Details Name: Logan French MRN: [id number] DOB: 1934/11/26   Cancelled Treatment:    Reason Eval/Treat Not Completed: Other (comment)  For abdominal aortogram today;   Will follow up later today as time allows;  Otherwise, will follow up for PT tomorrow;   Thank you,  Darcus Eastern, PT  Acute Rehabilitation Services Office [phone]    Marcial Setting 07/20/2023, 11:18 AM

## 2023-07-20 NOTE — Progress Notes (Signed)
 OT Cancellation Note  Patient Details Name: Logan French MRN: [id number] DOB: January 23, 1935   Cancelled Treatment:    Reason Eval/Treat Not Completed: Patient at procedure or test/ unavailable Pt off unit at abdominal aortogram. Will return as able to.  Maily Debarge C, OT  Acute Rehabilitation Services Office [phone] Secure chat preferred   Mickael Alamo 07/20/2023, 11:43 AM

## 2023-07-20 NOTE — Progress Notes (Signed)
  Progress Note    07/20/2023 7:37 AM  Subjective:  no complaints   Vitals:   07/19/23 2131 07/20/23 0321  BP: 133/72 137/81  Pulse: 82 82  Resp: 18 18  Temp: 98.6 F (37 C) 98 F (36.7 C)  SpO2: 95% 95%   Physical Exam: Lungs:  non labored Extremities: dressing left in place both feet Neurologic: A&O  CBC    Component Value Date/Time   WBC 15.0 (H) 07/19/2023 0716   RBC 4.03 (L) 07/19/2023 0716   HGB 11.6 (L) 07/19/2023 0716   HGB 13.4 09/08/2021 1027   HCT 35.1 (L) 07/19/2023 0716   HCT 41.2 09/08/2021 1027   PLT 205 07/19/2023 0716   PLT 270 09/08/2021 1027   MCV 87.1 07/19/2023 0716   MCV 85 09/08/2021 1027   MCH 28.8 07/19/2023 0716   MCHC 33.0 07/19/2023 0716   RDW 17.0 (H) 07/19/2023 0716   RDW 14.1 09/08/2021 1027   LYMPHSABS 0.7 07/15/2023 1829   MONOABS 0.8 07/15/2023 1829   EOSABS 0.0 07/15/2023 1829   BASOSABS 0.0 07/15/2023 1829    BMET    Component Value Date/Time   NA 134 (L) 07/19/2023 0716   NA 142 09/08/2021 1027   K 3.6 07/19/2023 0716   CL 101 07/19/2023 0716   CO2 24 07/19/2023 0716   GLUCOSE 114 (H) 07/19/2023 0716   BUN 11 07/19/2023 0716   BUN 23 09/08/2021 1027   CREATININE 0.76 07/19/2023 0716   CALCIUM  7.9 (L) 07/19/2023 0716   GFRNONAA >60 07/19/2023 0716   GFRAA 67 07/14/2019 1020    INR    Component Value Date/Time   INR 2.1 (H) 07/16/2023 0906    No intake or output data in the 24 hours ending 07/20/23 0737   Assessment/Plan:  89 y.o. male with L foot and achillis wounds  Plan is for LLE angiogram with possible intervention today with Dr. Charlotte Cookey.  Eliquis  on hold.  Renal function stable.  Continue npo.  Consent ordered.  All questions regarding angiogram were answered.    Cordie Deters, PA-C [facility] [phone] 07/20/2023 7:37 AM

## 2023-07-20 NOTE — TOC Initial Note (Signed)
 Transition of Care [facility]) - Initial/Assessment Note    Patient Details  Name: Logan French MRN: [id number] Date of Birth: 09-10-1934  Transition of Care [facility]) CM/SW Contact:    Alisa App, RN Phone Number: 07/20/2023, 10:29 AM  Clinical Narrative:        Presents with L foot and achillis wounds Readmit. Recent d/c from CIR 4/23. Pt is from home with wife. Pt with DME: RW,BSC and cane Confirmed PCP, Dr.Paterson. Pt without transportation issues.  Plan : LLE angiogram today, 5/6.  TOC team following and will assist with needs...  Expected Discharge Plan: Home/Self Care Barriers to Discharge: Continued Medical Work up   Patient Goals and CMS Choice            Expected Discharge Plan and Services   Discharge Planning Services: CM Consult   Living arrangements for the past 2 months: Single Family Home                                      Prior Living Arrangements/Services Living arrangements for the past 2 months: Single Family Home Lives with:: Spouse Patient language and need for interpreter reviewed:: Yes Do you feel safe going back to the place where you live?: Yes      Need for Family Participation in Patient Care: Yes (Comment) Care giver support system in place?: Yes (comment) Current home services: DME (RW, BSC, cane) Criminal Activity/Legal Involvement Pertinent to Current Situation/Hospitalization: No - Comment as needed  Activities of Daily Living   ADL Screening (condition at time of admission) Independently performs ADLs?: No Does the patient have a NEW difficulty with bathing/dressing/toileting/self-feeding that is expected to last >3 days?: Yes (Initiates electronic notice to provider for possible OT consult) Does the patient have a NEW difficulty with getting in/out of bed, walking, or climbing stairs that is expected to last >3 days?: Yes (Initiates electronic notice to provider for possible PT consult) Does the patient have a  NEW difficulty with communication that is expected to last >3 days?: No Is the patient deaf or have difficulty hearing?: No Does the patient have difficulty seeing, even when wearing glasses/contacts?: Yes Does the patient have difficulty concentrating, remembering, or making decisions?: Yes  Permission Sought/Granted   Permission granted to share information with : Yes, Verbal Permission Granted              Emotional Assessment   Attitude/Demeanor/Rapport: Engaged Affect (typically observed): Accepting   Alcohol  / Substance Use: Not Applicable Psych Involvement: No (comment)  Admission diagnosis:  Cellulitis [L03.90] Cellulitis, unspecified cellulitis site [L03.90] Sepsis, due to unspecified organism, unspecified whether acute organ dysfunction present [facility]) [A41.9] 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) 06/20/2023   Streptococcal bacteremia 06/18/2023   Cellulitis of right lower extremity 06/17/2023   Persistent atrial fibrillation (HCC) 09/23/2021   Hypercoagulable state due to persistent atrial fibrillation (HCC) 09/23/2021   Callus 10/25/2019   Corns and callosities 10/25/2019  Hav (hallux abducto valgus), unspecified laterality 10/25/2019   Altered awareness, transient 06/23/2017   MALLET FINGER, RIGHT RING FINGER 10/19/2008   PCP:  [name], MD Pharmacy:   [facility] - [place], [place] - [address] [place] [address] Phone: [phone]  Fax: [phone]     Social Drivers of Health (SDOH) Social History: SDOH Screenings   Food Insecurity: No Food Insecurity (07/16/2023)  Housing: Low Risk  (07/16/2023)  Transportation Needs: No Transportation Needs (07/16/2023)  Utilities: Not At Risk (07/16/2023)  Social Connections: Socially Integrated (07/16/2023)  Tobacco Use: Low Risk  (07/16/2023)   SDOH Interventions:     Readmission Risk Interventions     No data to display

## 2023-07-20 NOTE — Op Note (Signed)
 Patient name: Logan French MRN: [id number] DOB: 1934/06/27 Sex: male  07/20/2023 Pre-operative Diagnosis: Left leg ulcer Post-operative diagnosis:  Same Surgeon:  Gareld June Procedure Performed:  1.  Ultrasound-guided access, right femoral artery  2.  Abdominal aortogram  3.  Selective injection with catheter left SFA  4.  Shockwave intra-arterial lithotripsy left popliteal artery  5.  Conscious sedation, 50 minutes  6.  Closure device, Celt   Indications: This is a an 89 year old gentleman with recent intervention of the right leg who now has wounds on the left.  He comes in today for further evaluation and possible intervention  Procedure:  The patient was identified in the holding area and taken to room 8.  The patient was then placed supine on the table and prepped and draped in the usual sterile fashion.  A time out was called.  Conscious sedation was administered with the use of IV fentanyl  and Versed  under continuous physician and nurse monitoring.  Heart rate, blood pressure, and oxygen saturation were continuously monitored.  Total sedation time was 50 minutes.  Ultrasound was used to evaluate the right common femoral artery.  It was patent .  A digital ultrasound image was acquired.  A micropuncture needle was used to access the right common femoral artery under ultrasound guidance.  An 018 wire was advanced without resistance and a micropuncture sheath was placed.  The 018 wire was removed and a benson wire was placed.  The micropuncture sheath was exchanged for a 5 french sheath.  An omniflush catheter was advanced over the wire to the level of L-1.  An abdominal angiogram was obtained.  Next, using the omniflush catheter and a benson wire, the aortic bifurcation was crossed and the catheter was placed into theleft external iliac artery and left runoff was obtained.  Findings:   Aortogram: No significant renal artery stenosis was visualized.  The infrarenal abdominal aorta  is widely patent without significant stenosis.  Bilateral common and external iliac arteries are widely patent.  Left Lower Extremity: The left common femoral and profundofemoral artery are calcified but patent without significant stenosis.  The superficial femoral artery is patent without significant stenosis.  The popliteal artery at the level of the patella has luminal narrowing of approximately 60% which is circumferentially calcified.  The below-knee popliteal artery is widely patent with single-vessel runoff via the peroneal artery which does reconstitute a posterior tibial at the ankle providing perfusion to the foot  Intervention: After the above images were acquired the decision was made to proceed with intervention.  A 6 French 45 cm sheath was advanced into the left superficial femoral artery.  Selective injections were placed with a catheter at this level.  I then fully heparinized the patient.  An 014 [place] core wire was advanced across the lesion in the above-knee popliteal artery.  I selected a 5 x 80 shockwave intra-arterial lithotripsy balloon and performed balloon angioplasty at this level.  A total of 280 pulses were administered.  Completion imaging showed no residual stenosis with preserved runoff via the peroneal artery.  I was satisfied with these results.  The long sheath was exchanged out for a short 6 French sheath and a Celt was used for closure.  There were no complications.  Impression:  #1  Successful shockwave intra-arterial lithotripsy of a 60 to 70% left above-knee popliteal artery stenosis  #2  Single-vessel runoff to the left leg via the peroneal artery which reconstitutes the posterior tibial at the  ankle   V. Gareld June, M.D., [facility] [place] Office: [phone] Pager:  [phone]

## 2023-07-20 NOTE — H&P (View-Only) (Signed)
  Progress Note    07/20/2023 7:37 AM  Subjective:  no complaints   Vitals:   07/19/23 2131 07/20/23 0321  BP: 133/72 137/81  Pulse: 82 82  Resp: 18 18  Temp: 98.6 F (37 C) 98 F (36.7 C)  SpO2: 95% 95%   Physical Exam: Lungs:  non labored Extremities: dressing left in place both feet Neurologic: A&O  CBC    Component Value Date/Time   WBC 15.0 (H) 07/19/2023 0716   RBC 4.03 (L) 07/19/2023 0716   HGB 11.6 (L) 07/19/2023 0716   HGB 13.4 09/08/2021 1027   HCT 35.1 (L) 07/19/2023 0716   HCT 41.2 09/08/2021 1027   PLT 205 07/19/2023 0716   PLT 270 09/08/2021 1027   MCV 87.1 07/19/2023 0716   MCV 85 09/08/2021 1027   MCH 28.8 07/19/2023 0716   MCHC 33.0 07/19/2023 0716   RDW 17.0 (H) 07/19/2023 0716   RDW 14.1 09/08/2021 1027   LYMPHSABS 0.7 07/15/2023 1829   MONOABS 0.8 07/15/2023 1829   EOSABS 0.0 07/15/2023 1829   BASOSABS 0.0 07/15/2023 1829    BMET    Component Value Date/Time   NA 134 (L) 07/19/2023 0716   NA 142 09/08/2021 1027   K 3.6 07/19/2023 0716   CL 101 07/19/2023 0716   CO2 24 07/19/2023 0716   GLUCOSE 114 (H) 07/19/2023 0716   BUN 11 07/19/2023 0716   BUN 23 09/08/2021 1027   CREATININE 0.76 07/19/2023 0716   CALCIUM  7.9 (L) 07/19/2023 0716   GFRNONAA >60 07/19/2023 0716   GFRAA 67 07/14/2019 1020    INR    Component Value Date/Time   INR 2.1 (H) 07/16/2023 0906    No intake or output data in the 24 hours ending 07/20/23 0737   Assessment/Plan:  89 y.o. male with L foot and achillis wounds  Plan is for LLE angiogram with possible intervention today with Dr. Charlotte Cookey.  Eliquis  on hold.  Renal function stable.  Continue npo.  Consent ordered.  All questions regarding angiogram were answered.    Cordie Deters, PA-C [facility] [phone] 07/20/2023 7:37 AM

## 2023-07-21 ENCOUNTER — Ambulatory Visit: Admitting: Family

## 2023-07-21 ENCOUNTER — Encounter (HOSPITAL_COMMUNITY): Payer: Self-pay | Admitting: Surgery

## 2023-07-21 DIAGNOSIS — Z9889 Other specified postprocedural states: Secondary | ICD-10-CM

## 2023-07-21 DIAGNOSIS — I70245 Atherosclerosis of native arteries of left leg with ulceration of other part of foot: Secondary | ICD-10-CM

## 2023-07-21 DIAGNOSIS — L0390 Cellulitis, unspecified: Secondary | ICD-10-CM | POA: Diagnosis not present

## 2023-07-21 DIAGNOSIS — A419 Sepsis, unspecified organism: Secondary | ICD-10-CM | POA: Diagnosis not present

## 2023-07-21 LAB — BASIC METABOLIC PANEL WITH GFR
Anion gap: 7 (ref 5–15)
BUN: 12 mg/dL (ref 8–23)
CO2: 26 mmol/L (ref 22–32)
Calcium: 8.2 mg/dL — ABNORMAL LOW (ref 8.9–10.3)
Chloride: 103 mmol/L (ref 98–111)
Creatinine, Ser: 0.83 mg/dL (ref 0.61–1.24)
GFR, Estimated: >60 mL/min (ref >60)
Glucose, Bld: 117 mg/dL — ABNORMAL HIGH (ref 70–99)
Potassium: 4.1 mmol/L (ref 3.5–5.1)
Sodium: 136 mmol/L (ref 135–145)

## 2023-07-21 LAB — LIPID PANEL
Cholesterol: 59 mg/dL (ref 0–200)
HDL: 18 mg/dL — ABNORMAL LOW (ref >40)
LDL Cholesterol: 27 mg/dL (ref 0–99)
Total CHOL/HDL Ratio: 3.3 ratio
Triglycerides: 68 mg/dL (ref <150)
VLDL: 14 mg/dL (ref 0–40)

## 2023-07-21 LAB — GLUCOSE, CAPILLARY
Glucose-Capillary: 124 mg/dL — ABNORMAL HIGH (ref 70–99)
Glucose-Capillary: 142 mg/dL — ABNORMAL HIGH (ref 70–99)
Glucose-Capillary: 134 mg/dL — ABNORMAL HIGH (ref 70–99)
Glucose-Capillary: 151 mg/dL — ABNORMAL HIGH (ref 70–99)

## 2023-07-21 LAB — CBC
HCT: 36.5 % — ABNORMAL LOW (ref 39.0–52.0)
Hemoglobin: 11.7 g/dL — ABNORMAL LOW (ref 13.0–17.0)
MCH: 27.7 pg (ref 26.0–34.0)
MCHC: 32.1 g/dL (ref 30.0–36.0)
MCV: 86.5 fL (ref 80.0–100.0)
Platelets: 238 10*3/uL (ref 150–400)
RBC: 4.22 MIL/uL (ref 4.22–5.81)
RDW: 16.9 % — ABNORMAL HIGH (ref 11.5–15.5)
WBC: 10.5 10*3/uL (ref 4.0–10.5)
nRBC: 0 % (ref 0.0–0.2)

## 2023-07-21 MED ORDER — STUDY - LIBREXIA-AF - JNJ-70033093 (MILVEXIAN) 100 MG OR PLACEBO (PI-SETHI)
1.0000 | ORAL_TABLET | Freq: Two times a day (BID) | ORAL | Status: DC
  Administered 2023-07-21 – 2023-07-27 (×13): 1 via ORAL
  Filled 2023-07-21 (×14): qty 1

## 2023-07-21 MED ORDER — SODIUM CHLORIDE 0.9% FLUSH
10.0000 mL | Freq: Two times a day (BID) | INTRAVENOUS | Status: DC
  Administered 2023-07-21 – 2023-07-23 (×5): 10 mL
  Administered 2023-07-24: 20 mL
  Administered 2023-07-24 – 2023-07-27 (×6): 10 mL

## 2023-07-21 MED ORDER — STUDY - LIBREXIA-AF - APIXABAN 5 MG OR PLACEBO CAPSULE (PI-SETHI)
5.0000 mg | ORAL_CAPSULE | Freq: Two times a day (BID) | ORAL | Status: DC
  Administered 2023-07-21 – 2023-07-27 (×13): 5 mg via ORAL
  Filled 2023-07-21 (×14): qty 1

## 2023-07-21 MED ORDER — SODIUM CHLORIDE 0.9% FLUSH: 10.0000 mL | INTRAVENOUS | Status: DC | PRN

## 2023-07-21 NOTE — Progress Notes (Signed)
 PHARMACIST LIPID MONITORING   Logan French is a 89 y.o. male admitted on 07/15/2023. Pharmacy has been consulted to optimize lipid-lowering therapy with the indication of secondary prevention for clinical ASCVD.  Recent Labs:  Lipid Panel (last 6 months):   Lab Results  Component Value Date   CHOL 59 07/21/2023   TRIG 68 07/21/2023   HDL 18 (L) 07/21/2023   CHOLHDL 3.3 07/21/2023   VLDL 14 07/21/2023   LDLCALC 27 07/21/2023    Hepatic function panel (last 6 months):   Lab Results  Component Value Date   AST 32 07/15/2023   ALT 26 07/15/2023   ALKPHOS 82 07/15/2023   BILITOT 1.4 (H) 07/15/2023    SCr (since admission):   Serum creatinine: 0.83 mg/dL 16/10/96 0454 Estimated creatinine clearance: 78.8 mL/min  Current therapy and lipid therapy tolerance Current lipid-lowering therapy: rosuvastatin  20 daily Previous lipid-lowering therapies (if applicable): n/a Documented or reported allergies or intolerances to lipid-lowering therapies (if applicable): n/a  Assessment/Plan:  LDL = 27 - at goal   1.Statin intensity (high intensity recommended for all patients regardless of the LDL):  No statin changes. The patient is already on a high intensity statin.  2.Add ezetimibe  (if any one of the following):   Not indicated at this time.  3.Refer to lipid clinic:   No  4.Follow-up with:  Primary care provider - Logan Broad, MD  5.Follow-up labs after discharge:  No changes in lipid therapy, repeat a lipid panel in one year.     Joanell Mowers, Davey Erp, BCCP Clinical Pharmacist  07/21/2023 2:20 PM   [facility] pharmacy phone numbers are listed on [email or web address]

## 2023-07-21 NOTE — Progress Notes (Signed)
 Inpatient Rehab Admissions Coordinator Note:   Per therapy recommendations patient was screened for CIR candidacy by Mickey Alar, PT. At this time, pt appears to be a potential candidate for CIR. I will place an order for rehab consult for full assessment, per our protocol.  Please contact me any with questions.Loye Rumble, PT, DPT [phone] 07/21/23 3:25 PM

## 2023-07-21 NOTE — Progress Notes (Signed)
  Progress Note    07/21/2023 7:53 AM 1 Day Post-Op  Subjective:  no complaints   Vitals:   07/20/23 2322 07/21/23 0440  BP: (!) 148/83 (!) 157/81  Pulse: 85 88  Resp: 19 18  Temp: 98.8 F (37.1 C) 98.4 F (36.9 C)  SpO2: 96%    Physical Exam: Lungs:  non labored Incisions:  R groin without hematoma Extremities:  new dressing in place L LE Neurologic: A&O  CBC    Component Value Date/Time   WBC 10.5 07/21/2023 0412   RBC 4.22 07/21/2023 0412   HGB 11.7 (L) 07/21/2023 0412   HGB 13.4 09/08/2021 1027   HCT 36.5 (L) 07/21/2023 0412   HCT 41.2 09/08/2021 1027   PLT 238 07/21/2023 0412   PLT 270 09/08/2021 1027   MCV 86.5 07/21/2023 0412   MCV 85 09/08/2021 1027   MCH 27.7 07/21/2023 0412   MCHC 32.1 07/21/2023 0412   RDW 16.9 (H) 07/21/2023 0412   RDW 14.1 09/08/2021 1027   LYMPHSABS 0.7 07/15/2023 1829   MONOABS 0.8 07/15/2023 1829   EOSABS 0.0 07/15/2023 1829   BASOSABS 0.0 07/15/2023 1829    BMET    Component Value Date/Time   NA 136 07/21/2023 0412   NA 142 09/08/2021 1027   K 4.1 07/21/2023 0412   CL 103 07/21/2023 0412   CO2 26 07/21/2023 0412   GLUCOSE 117 (H) 07/21/2023 0412   BUN 12 07/21/2023 0412   BUN 23 09/08/2021 1027   CREATININE 0.83 07/21/2023 0412   CALCIUM  8.2 (L) 07/21/2023 0412   GFRNONAA >60 07/21/2023 0412   GFRAA 67 07/14/2019 1020    INR    Component Value Date/Time   INR 2.1 (H) 07/16/2023 0906     Intake/Output Summary (Last 24 hours) at 07/21/2023 0753 Last data filed at 07/20/2023 2000 Gross per 24 hour  Intake 240 ml  Output 300 ml  Net -60 ml     Assessment/Plan:  89 y.o. male is s/p LLE angiogram 1 Day Post-Op   Successful shockwave lithotripsy of L popliteal artery.  He has single vessel peroneal runoff but is considered optimized from a vascular standpoint.  R groin access site is without hematoma.  Continue plavix .  Continue daily dressing changes.  Elevate legs when resting.  Continue work with therapy  teams.  Office will arrange BLE arterial duplex and ABI in 4-6 weeks.     Cordie Deters, PA-C [facility] [phone] 07/21/2023 7:53 AM

## 2023-07-21 NOTE — Plan of Care (Signed)
  Problem: Clinical Measurements: Goal: Respiratory complications will improve Outcome: Progressing Goal: Cardiovascular complication will be avoided Outcome: Progressing   Problem: Pain Managment: Goal: General experience of comfort will improve and/or be controlled Outcome: Progressing   Problem: Safety: Goal: Ability to remain free from injury will improve Outcome: Progressing   Problem: Cardiovascular: Goal: Ability to achieve and maintain adequate cardiovascular perfusion will improve Outcome: Progressing Goal: Vascular access site(s) Level 0-1 will be maintained Outcome: Progressing

## 2023-07-21 NOTE — Evaluation (Signed)
 Occupational Therapy Re Evaluation Patient Details Name: Logan French MRN: [id number] DOB: [birth year] Today's Date: 07/21/2023   History of Present Illness   Pt is an 89 y/o male admitted 07/15/2023 from PCP with worsening left foot infection with ulceration, and disorientation, admission in March 2025 for RLE angioplasty, April for for RLE cellulitis and nonhealing wound. +for bacetermia. s/p 5/6  LLE angiogram PMH: prostate cancer, anemia, HTN, HLD, a fib, b12 deficiency, PAD s/p vascular intervention.     Clinical Impressions Pt presents with decline in function and safety with ADLs and ADL mobility with impaired strength, balance ane endurance; pt limited by pain in L LE/foot. PTA pt at home with his wife Ind with ADLs, toileting, wife helps with sock/shoes, sponge bathing,using RW at home from recent discharge from [facility] 07/07/23. Pt currently requires mod A to sit EOB, CGA with UB ADLs max - total A with LB ADLs and total A with toileting at bed level. Pt required total A sit - stand form EOB to RW but unable to achieve a full upright stand,attempted 2 more times and unable to clear buttocks. Pt would benefit form acute OT services to address impairments to maximize level of function and safety      If plan is discharge home, recommend the following:   A lot of help with walking and/or transfers;A lot of help with bathing/dressing/bathroom;Assistance with cooking/housework;Assist for transportation;Help with stairs or ramp for entrance     Functional Status Assessment   Patient has had a recent decline in their functional status and demonstrates the ability to make significant improvements in function in a reasonable and predictable amount of time.     Equipment Recommendations   Other (comment) (defer)     Recommendations for Other Services   Rehab consult     Precautions/Restrictions   Precautions Precautions: Fall Recall of Precautions/Restrictions:  Intact Precaution/Restrictions Comments: chronic bilateral knee pain,  RLE achilles wound, L LE blisters Restrictions Weight Bearing Restrictions Per Provider Order: No     Mobility Bed Mobility Overal bed mobility: Needs Assistance Bed Mobility: Supine to Sit, Sit to Supine     Supine to sit: Mod assist Sit to supine: Max assist   General bed mobility comments: mod A with LE mgt and to elevate trunk to sit EOB, max A with LEs into bed,able to scoot self to [facility] using rails    Transfers Overall transfer level: Needs assistance Equipment used: Rolling walker (2 wheels) Transfers: Sit to/from Stand Sit to Stand: Total assist           General transfer comment: total A sit - stand and able to achieve full upright stand, attempted 2 more times to stand and unable to clear buttocks from bed      Balance Overall balance assessment: Needs assistance Sitting-balance support: No upper extremity supported, Feet supported Sitting balance-Leahy Scale: Fair     Standing balance support: Bilateral upper extremity supported, During functional activity, Reliant on assistive device for balance Standing balance-Leahy Scale: Zero Standing balance comment: reliant on RW for support, painful to WB through LLE                           ADL either performed or assessed with clinical judgement   ADL Overall ADL's : Needs assistance/impaired Eating/Feeding: Independent   Grooming: Wash/dry hands;Wash/dry face;Contact guard assist;Sitting   Upper Body Bathing: Contact guard assist;Sitting   Lower Body Bathing: Maximal assistance  Upper Body Dressing : Contact guard assist;Sitting   Lower Body Dressing: Total assistance       Toileting- Clothing Manipulation and Hygiene: Total assistance;Bed level               Vision Baseline Vision/History: 1 Wears glasses Ability to See in Adequate Light: 0 Adequate Patient Visual Report: No change from baseline        Perception         Praxis         Pertinent Vitals/Pain Pain Assessment Pain Assessment: Faces Faces Pain Scale: Hurts even more Pain Location: L LE with WB Pain Descriptors / Indicators: Discomfort, Grimacing, Guarding Pain Intervention(s): Limited activity within patient's tolerance, Monitored during session, Repositioned     Extremity/Trunk Assessment Upper Extremity Assessment Upper Extremity Assessment: Generalized weakness   Lower Extremity Assessment Lower Extremity Assessment: Defer to PT evaluation   Cervical / Trunk Assessment Cervical / Trunk Assessment: Normal   Communication Communication Communication: No apparent difficulties   Cognition Arousal: Alert   Cognition: No apparent impairments             OT - Cognition Comments: hyperverbose, aware of precautions                 Following commands: Intact Following commands impaired: Follows one step commands with increased time     Cueing  General Comments   Cueing Techniques: Verbal cues      Exercises     Shoulder Instructions      Home Living Family/patient expects to be discharged to:: Private residence Living Arrangements: Spouse/significant other Available Help at Discharge: Family;Available PRN/intermittently Type of Home: House Home Access: Stairs to enter [employer] of Steps: 3 STE front entry with R ledge vs rail, planning to install rail Entrance Stairs-Rails: Can reach both Home Layout: Multi-level;Able to live on main level with bedroom/bathroom Alternate Level Stairs-Number of Steps: 2 steps down into kitchen/living room with railing. main level has bedroom/bathroom and an upstairs that pt does not have to use   Bathroom Shower/Tub: Walk-in shower;Door   [employer] Toilet: Standard Bathroom Accessibility: Yes How Accessible: Accessible via walker Home Equipment: Cane - single point;Rollator (4 wheels);Rolling Walker (2 wheels);Shower seat;Hand held  shower head;Toilet riser;Grab bars - toilet;Grab bars - tub/shower   Additional Comments: has SPC  Lives With: Spouse    Prior Functioning/Environment Prior Level of Function : Needs assist             Mobility Comments: using RW at home from recent discharge from [facility] 4/23 ADLs Comments: Ind with ADLs, toileting, wife helps with sock/shoes, sponge bathing    OT Problem List: Decreased strength;Decreased activity tolerance;Impaired balance (sitting and/or standing);Pain   OT Treatment/Interventions: Self-care/ADL training;Therapeutic exercise;Energy conservation;DME and/or AE instruction;Therapeutic activities;Patient/family education;Balance training      OT Goals(Current goals can be found in the care plan section)   Acute Rehab OT Goals Patient Stated Goal: get better OT Goal Formulation: With patient/family Time For Goal Achievement: 08/04/23   OT Frequency:  Min 2X/week    Co-evaluation              AM-PAC OT "6 Clicks" Daily Activity     Outcome Measure Help from another person eating meals?: None Help from another person taking care of personal grooming?: A Little Help from another person toileting, which includes using toliet, bedpan, or urinal?: Total Help from another person bathing (including washing, rinsing, drying)?: A Lot Help from another person to put on and  taking off regular upper body clothing?: A Little Help from another person to put on and taking off regular lower body clothing?: Total 6 Click Score: 14   End of Session Equipment Utilized During Treatment: Gait belt;Rolling walker (2 wheels) Nurse Communication: Mobility status  Activity Tolerance: No increased pain;Patient limited by fatigue Patient left: with call bell/phone within reach;in bed  OT Visit Diagnosis: Unsteadiness on feet (R26.81);Other abnormalities of gait and mobility (R26.89);Muscle weakness (generalized) (M62.81);Pain Pain - Right/Left: Left Pain - part of body:  Ankle and joints of foot;Leg                Time: 1610-9604 OT Time Calculation (min): 30 min Charges:  OT General Charges $OT Visit: 1 Visit OT Evaluation $OT Re-eval: 1 Re-eval OT Treatments $Therapeutic Activity: 8-22 mins    Alfred Ann 07/21/2023, 1:56 PM

## 2023-07-21 NOTE — Progress Notes (Signed)

## 2023-07-21 NOTE — Progress Notes (Signed)
 [facility] PROGRESS NOTE    Progress Note  Logan French  ZOX:[id number] DOB: 08-07-1934 DOA: 07/15/2023 PCP: [name], MD     Brief Narrative:   Logan French is an 89 y.o. male past medical history significant for atrial fibrillation status post cardioversion in 2023, hyperlipidemia pernicious anemia, prostate cancer peripheral vascular disease status post angioplasty on March 2025, recently treated for left lower extremity cellulitis for which she completed treatment was discharged to rehab on 07/07/2023 comes back for left foot infection with ulceration and encephalopathic   Assessment/Plan:   Sepsis due to undetermined organism secondary to cellulitis Imaging does not show an abscess. Vascular surgery performed angiogram on 07/20/2023 status post intra-arterial lithotripsy above the left popliteal artery stenosis. Will need Doppler as an outpatient in 4 to 6 weeks. Continue IV vancomycin  and Rocephin   Peripheral arterial disease (HCC): Holding Eliquis , continue Plavix . Vascular surgery to dictate when to start Eliquis .  Diabetes mellitus type 2 with hyperglycemia: A1c of 6.4, continue sliding scale insulin .  Essential hypertension: Blood pressure seems to be well-controlled.  BPH: Continue finasteride .  Dyslipidemia: Continue current medication.  Chronic atrial fibrillation: Holding Eliquis  for procedure vascular to dictate when to restart. Continue metoprolol . Cont librexia  DVT prophylaxis: librexia Family Communication:none Status is: Inpatient Remains inpatient appropriate because: Right lower extremity cellulitis    Code Status:     Code Status Orders  (From admission, onward)           Start     Ordered   07/15/23 2104  Full code  Continuous       Question:  By:  Answer:  Other   07/15/23 2103           Code Status History     Date Active Date Inactive Code Status Order ID Comments User Context   06/25/2023 1546  07/07/2023 1634 Full Code [id number]  Geradine Kitten Inpatient   06/25/2023 1546 06/25/2023 1546 Full Code [id number]  Geradine Kitten Inpatient   06/17/2023 1401 06/25/2023 1540 Full Code [id number]  Lavanda Porter, MD Inpatient   05/17/2023 0911 05/17/2023 1647 Full Code [id number]  Philipp Brawn, MD Inpatient      Advance Directive Documentation    Flowsheet Row Most Recent Value  Type of Advance Directive Healthcare Power of Attorney  Pre-existing out of facility DNR order (yellow form or pink MOST form) --  "MOST" Form in Place? --         IV Access:   Peripheral IV   Procedures and diagnostic studies:   PERIPHERAL VASCULAR CATHETERIZATION Result Date: 07/20/2023 Images from the original result were not included. Patient name: Logan French MRN: [id number] DOB: Nov 30, 1934 Sex: male 07/20/2023 Pre-operative Diagnosis: Left leg ulcer Post-operative diagnosis:  Same Surgeon:  Gareld June Procedure Performed:  1.  Ultrasound-guided access, right femoral artery  2.  Abdominal aortogram  3.  Selective injection with catheter left SFA  4.  Shockwave intra-arterial lithotripsy left popliteal artery  5.  Conscious sedation, 50 minutes  6.  Closure device, Celt Indications: This is a an 89 year old gentleman with recent intervention of the right leg who now has wounds on the left.  He comes in today for further evaluation and possible intervention Procedure:  The patient was identified in the holding area and taken to room 8.  The patient was then placed supine on the table and prepped and draped in the usual sterile fashion.  A time  out was called.  Conscious sedation was administered with the use of IV fentanyl  and Versed  under continuous physician and nurse monitoring.  Heart rate, blood pressure, and oxygen saturation were continuously monitored.  Total sedation time was 50 minutes.  Ultrasound was used to evaluate the right common femoral artery.  It was patent .  A digital  ultrasound image was acquired.  A micropuncture needle was used to access the right common femoral artery under ultrasound guidance.  An 018 wire was advanced without resistance and a micropuncture sheath was placed.  The 018 wire was removed and a benson wire was placed.  The micropuncture sheath was exchanged for a 5 french sheath.  An omniflush catheter was advanced over the wire to the level of L-1.  An abdominal angiogram was obtained.  Next, using the omniflush catheter and a benson wire, the aortic bifurcation was crossed and the catheter was placed into theleft external iliac artery and left runoff was obtained. Findings:  Aortogram: No significant renal artery stenosis was visualized.  The infrarenal abdominal aorta is widely patent without significant stenosis.  Bilateral common and external iliac arteries are widely patent.  Left Lower Extremity: The left common femoral and profundofemoral artery are calcified but patent without significant stenosis.  The superficial femoral artery is patent without significant stenosis.  The popliteal artery at the level of the patella has luminal narrowing of approximately 60% which is circumferentially calcified.  The below-knee popliteal artery is widely patent with single-vessel runoff via the peroneal artery which does reconstitute a posterior tibial at the ankle providing perfusion to the foot Intervention: After the above images were acquired the decision was made to proceed with intervention.  A 6 French 45 cm sheath was advanced into the left superficial femoral artery.  Selective injections were placed with a catheter at this level.  I then fully heparinized the patient.  An 014 [place] core wire was advanced across the lesion in the above-knee popliteal artery.  I selected a 5 x 80 shockwave intra-arterial lithotripsy balloon and performed balloon angioplasty at this level.  A total of 280 pulses were administered.  Completion imaging showed no residual  stenosis with preserved runoff via the peroneal artery.  I was satisfied with these results.  The long sheath was exchanged out for a short 6 French sheath and a Celt was used for closure.  There were no complications. Impression:  #1  Successful shockwave intra-arterial lithotripsy of a 60 to 70% left above-knee popliteal artery stenosis  #2  Single-vessel runoff to the left leg via the peroneal artery which reconstitutes the posterior tibial at the ankle V. Gareld June, M.D., [facility] Office: [phone] Pager:  [phone]     Medical Consultants:   None.   Subjective:    Logan French relates his pain is controlled.  Objective:    Vitals:   07/20/23 2322 07/21/23 0440 07/21/23 0808 07/21/23 1144  BP: (!) 148/83 (!) 157/81 (!) 157/85   Pulse: 85 88 73 91  Resp: 19 18 20 18   Temp: 98.8 F (37.1 C) 98.4 F (36.9 C) 99.4 F (37.4 C) 99.2 F (37.3 C)  TempSrc: Oral Oral Oral Oral  SpO2: 96%  96%   Weight:      Height:       SpO2: 96 %   Intake/Output Summary (Last 24 hours) at 07/21/2023 1315 Last data filed at 07/21/2023 1044 Gross per 24 hour  Intake 240 ml  Output 450 ml  Net -210 ml   Filed Weights   07/15/23 2125  Weight: 101.6 kg    Exam: General exam: In no acute distress. Respiratory system: Good air movement and clear to auscultation. Cardiovascular system: S1 & S2 heard, RRR. No JVD. Gastrointestinal system: Abdomen is nondistended, soft and nontender.  Extremities: No pedal edema. Skin: No rashes, lesions or ulcers Psychiatry: Judgement and insight appear normal. Mood & affect appropriate.    Data Reviewed:    Labs: Basic Metabolic Panel: Recent Labs  Lab 07/15/23 1829 07/16/23 0906 07/17/23 0625 07/19/23 0716 07/21/23 0412  NA 132* 133* 132* 134* 136  K 3.6 3.7 4.1 3.6 4.1  CL 97* 99 99 101 103  CO2 23 21* 23 24 26   GLUCOSE 151* 141* 105* 114* 117*  BUN 20 18 18 11 12   CREATININE 1.26*  1.19 0.95 0.76 0.83  CALCIUM  8.3* 8.0* 8.0* 7.9* 8.2*  PHOS 2.3*  --   --   --   --    GFR Estimated Creatinine Clearance: 78.8 mL/min (by C-G formula based on SCr of 0.83 mg/dL). Liver Function Tests: Recent Labs  Lab 07/15/23 1829  AST 32  ALT 26  ALKPHOS 82  BILITOT 1.4*  PROT 6.1*  ALBUMIN 2.8*   No results for input(s): "LIPASE", "AMYLASE" in the last 168 hours. No results for input(s): "AMMONIA" in the last 168 hours. Coagulation profile Recent Labs  Lab 07/16/23 0906  INR 2.1*   COVID-19 Labs  No results for input(s): "DDIMER", "FERRITIN", "LDH", "CRP" in the last 72 hours.  Lab Results  Component Value Date   SARSCOV2NAA Not Detected 03/22/2019    CBC: Recent Labs  Lab 07/15/23 1829 07/16/23 0906 07/17/23 0625 07/19/23 0716 07/21/23 0412  WBC 15.3* 15.6* 16.1* 15.0* 10.5  NEUTROABS 13.7*  --   --   --   --   HGB 11.7* 11.1* 11.4* 11.6* 11.7*  HCT 36.3* 34.6* 36.9* 35.1* 36.5*  MCV 88.5 87.6 92.0 87.1 86.5  PLT 212 167 148* 205 238   Cardiac Enzymes: Recent Labs  Lab 07/15/23 1829  CKTOTAL 54   BNP (last 3 results) No results for input(s): "PROBNP" in the last 8760 hours. CBG: Recent Labs  Lab 07/20/23 1322 07/20/23 1600 07/20/23 2120 07/21/23 0751 07/21/23 1142  GLUCAP 92 81 167* 124* 142*   D-Dimer: No results for input(s): "DDIMER" in the last 72 hours. Hgb A1c: No results for input(s): "HGBA1C" in the last 72 hours. Lipid Profile: Recent Labs    07/21/23 0412  CHOL 59  HDL 18*  LDLCALC 27  TRIG 68  CHOLHDL 3.3   Thyroid  function studies: No results for input(s): "TSH", "T4TOTAL", "T3FREE", "THYROIDAB" in the last 72 hours.  Invalid input(s): "FREET3" Anemia work up: No results for input(s): "VITAMINB12", "FOLATE", "FERRITIN", "TIBC", "IRON", "RETICCTPCT" in the last 72 hours. Sepsis Labs: Recent Labs  Lab 07/15/23 1835 07/15/23 2110 07/16/23 0906 07/17/23 0625 07/19/23 0716 07/21/23 0412  WBC  --   --  15.6*  16.1* 15.0* 10.5  LATICACIDVEN 3.7* 3.6*  --   --   --   --    Microbiology Recent Results (from the past 240 hours)  Blood culture (routine x 2)     Status: None   Collection Time: 07/15/23  6:29 PM   Specimen: BLOOD LEFT ARM  Result Value Ref Range Status   Specimen Description   Final    BLOOD LEFT ARM Performed at [facility], [address], [place], [place] [address]    Special Requests   Final    BOTTLES DRAWN AEROBIC AND ANAEROBIC Blood Culture results may not be optimal due to an inadequate volume of blood received in culture bottles Performed at [facility], [address], [place], [place] [address]    Culture   Final    NO GROWTH 5 DAYS Performed at [facility], [address], [place], [place] [address]    Report Status 07/20/2023 FINAL  Final  Blood culture (routine x 2)     Status: None   Collection Time: 07/15/23  7:50 PM   Specimen: BLOOD RIGHT ARM  Result Value Ref Range Status   Specimen Description   Final    BLOOD RIGHT ARM Performed at [facility], [address] [place], [place] [address]    Special Requests   Final    BOTTLES DRAWN AEROBIC AND ANAEROBIC Blood Culture adequate volume Performed at [facility], [address] [place], [place] [address]    Culture   Final    NO GROWTH 5 DAYS Performed at [facility], [address] [place], [place] [address]    Report Status 07/20/2023 FINAL  Final     Medications:    clopidogrel   75 mg Oral Q breakfast   ezetimibe   10 mg Oral Daily   finasteride   5 mg Oral Daily   insulin  aspart  0-5 Units Subcutaneous QHS   insulin  aspart  0-9 Units Subcutaneous TID WC   pantoprazole   40 mg Oral Daily   rosuvastatin   20 mg Oral Daily   senna-docusate  2 tablet Oral BID   sodium chloride  flush  3 mL Intravenous Q12H   sodium chloride  flush  3 mL Intravenous Q12H   LIBREXIA-AF apixaban  or placebo  5 mg Oral BID   LIBREXIA-AF  [id number] (Milvexian) or placebo  1 tablet Oral BID   Continuous Infusions:  sodium chloride      cefTRIAXone  (ROCEPHIN )  IV 2 g (07/19/23 1428)   vancomycin  1,500 mg (07/20/23 1743)      LOS: 6 days   Logan French  [facility]  07/21/2023, 1:15 PM

## 2023-07-21 NOTE — Progress Notes (Signed)
 Physical Therapy Treatment Patient Details Name: Logan French MRN: [id number] DOB: Nov 13, 1934 Today's Date: 07/21/2023   History of Present Illness Pt is an 89 y/o male admitted He presents to our facility with worsening left foot infection with ulceration, disorientatio, prior admission in March 2025 for RLE angioplasty, April for for RLE cellulitis and nonhealing wound. +for bacetermia. PMH: prostate cancer, anemia, HTN, HLD, a fib, b12 deficiency, PAD s/p vascular intervention.    PT Comments  Pt reports he has had some pain today and that he was disappointed with his performance this morning with OT. Pt is limited in safe mobility by pain L LE> R LE, as well as decreased strength, balance and endurance. Pt eager to show off his LE ROM and strength while supine. Apprehensive about getting up to EoB. Pt requiring min A for getting to EOB. modAx2 for coming to standing in [place] from lower bed surface, CGA Ax2 for multiple sit<>stands from elevated [place] pads and minAx2 to return to lowered bed. Pt requires modAx2 for returning LE to bed. Pt had very positive experience with CIR and is hopeful he will be able to go back for rehab prior to return home. PT will continue to follow acutely.    If plan is discharge home, recommend the following: Assistance with cooking/housework;Assist for transportation;Help with stairs or ramp for entrance;A lot of help with bathing/dressing/bathroom;A lot of help with walking and/or transfers   Can travel by private vehicle      No   Equipment Recommendations  BSC/3in1;Wheelchair (measurements PT);Wheelchair cushion (measurements PT)       Precautions / Restrictions Precautions Precautions: Fall Recall of Precautions/Restrictions: Intact Restrictions Weight Bearing Restrictions Per Provider Order: No     Mobility  Bed Mobility Overal bed mobility: Needs Assistance Bed Mobility: Supine to Sit     Supine to sit: Contact guard, Used rails Sit to  supine: Min assist   General bed mobility comments: CGA from flat bed, used rails, increased time and effort to assist to EOB, modA for returning LE to bed    Transfers Overall transfer level: Needs assistance Equipment used: Ambulation equipment used Transfers: Sit to/from Stand, Bed to chair/wheelchair/BSC Sit to Stand: From elevated surface, Min assist, +2 physical assistance, Contact guard assist, Mod assist           General transfer comment: modAx2 for initial power up into standing in [place], once up in [place] pt able to perform 3x sit to stand from the elevated Stedy pads with contact guard assist, min Ax2 provided for lowering back to EoB          Balance Overall balance assessment: Needs assistance Sitting-balance support: No upper extremity supported, Feet supported Sitting balance-Leahy Scale: Fair     Standing balance support: Bilateral upper extremity supported, During functional activity, Reliant on assistive device for balance Standing balance-Leahy Scale: Poor Standing balance comment: reliant on Stedy for support due to painful L LE                            Communication Communication Communication: No apparent difficulties  Cognition Arousal: Alert Behavior During Therapy: WFL for tasks assessed/performed   PT - Cognitive impairments: Sequencing                       PT - Cognition Comments: very verbose Following commands: Intact      Cueing Cueing Techniques: Verbal cues  Exercises  General Exercises - Lower Extremity Heel Slides: Both, 5 reps, AAROM Straight Leg Raises: Both, 5 reps, AAROM Other Exercises Other Exercises: elevated hip adduction across midline bilaterally x5 each side    General Comments General comments (skin integrity, edema, etc.): VSS on RA      Pertinent Vitals/Pain Pain Assessment Pain Assessment: Faces Faces Pain Scale: Hurts even more Pain Location: LLE with WB Pain Descriptors / Indicators:  Discomfort, Grimacing, Guarding Pain Intervention(s): Limited activity within patient's tolerance, Monitored during session, Repositioned    Home Living Family/patient expects to be discharged to:: Private residence Living Arrangements: Spouse/significant other Available Help at Discharge: Family;Available PRN/intermittently Type of Home: House Home Access: Stairs to enter Entrance Stairs-Rails: Can reach both Entrance Stairs-Number of Steps: 3 STE front entry with R ledge vs rail, planning to install rail Alternate Level Stairs-Number of Steps: 2 steps down into kitchen/living room with railing. main level has bedroom/bathroom and an upstairs that pt does not have to use Home Layout: Multi-level;Able to live on main level with bedroom/bathroom Home Equipment: Cane - single point;Rollator (4 wheels);Rolling Walker (2 wheels);Shower seat;Hand held shower head;Toilet riser;Grab bars - toilet;Grab bars - tub/shower Additional Comments: has SPC    Prior Function            PT Goals (current goals can now be found in the care plan section) Acute Rehab PT Goals Patient Stated Goal: Get well, reduce pain PT Goal Formulation: With patient Time For Goal Achievement: 07/31/23 Potential to Achieve Goals: Fair Progress towards PT goals: Progressing toward goals    Frequency    Min 3X/week       AM-PAC PT "6 Clicks" Mobility   Outcome Measure  Help needed turning from your back to your side while in a flat bed without using bedrails?: A Little Help needed moving from lying on your back to sitting on the side of a flat bed without using bedrails?: A Little Help needed moving to and from a bed to a chair (including a wheelchair)?: Total Help needed standing up from a chair using your arms (e.g., wheelchair or bedside chair)?: A Lot Help needed to walk in hospital room?: Total Help needed climbing 3-5 steps with a railing? : Total 6 Click Score: 11    End of Session Equipment  Utilized During Treatment: Gait belt Activity Tolerance: Patient tolerated treatment well Patient left: in bed;with call bell/phone within reach;with chair alarm set Nurse Communication: Mobility status PT Visit Diagnosis: Unsteadiness on feet (R26.81);Muscle weakness (generalized) (M62.81);Difficulty in walking, not elsewhere classified (R26.2);Pain;Other abnormalities of gait and mobility (R26.89) Pain - Right/Left: Left Pain - part of body: Leg;Ankle and joints of foot     Time: 0865-7846 PT Time Calculation (min) (ACUTE ONLY): 31 min  Charges:    $Therapeutic Exercise: 8-22 mins $Therapeutic Activity: 8-22 mins PT General Charges $$ ACUTE PT VISIT: 1 Visit                     Rashida Ladouceur B. [name] PT, DPT Acute Rehabilitation Services Please use secure chat or  Call Office [phone]    [name] [facility] 07/21/2023, 4:46 PM

## 2023-07-22 DIAGNOSIS — L0390 Cellulitis, unspecified: Secondary | ICD-10-CM | POA: Diagnosis not present

## 2023-07-22 DIAGNOSIS — A419 Sepsis, unspecified organism: Secondary | ICD-10-CM | POA: Diagnosis not present

## 2023-07-22 DIAGNOSIS — Z9889 Other specified postprocedural states: Secondary | ICD-10-CM

## 2023-07-22 DIAGNOSIS — L539 Erythematous condition, unspecified: Secondary | ICD-10-CM

## 2023-07-22 LAB — GLUCOSE, CAPILLARY
Glucose-Capillary: 104 mg/dL — ABNORMAL HIGH (ref 70–99)
Glucose-Capillary: 138 mg/dL — ABNORMAL HIGH (ref 70–99)
Glucose-Capillary: 141 mg/dL — ABNORMAL HIGH (ref 70–99)
Glucose-Capillary: 151 mg/dL — ABNORMAL HIGH (ref 70–99)

## 2023-07-22 MED ORDER — DOXYCYCLINE HYCLATE 100 MG PO TABS
100.0000 mg | ORAL_TABLET | Freq: Two times a day (BID) | ORAL | Status: DC
  Administered 2023-07-22 – 2023-07-24 (×5): 100 mg via ORAL
  Filled 2023-07-22 (×5): qty 1

## 2023-07-22 MED ORDER — AMOXICILLIN-POT CLAVULANATE 875-125 MG PO TABS
1.0000 | ORAL_TABLET | Freq: Two times a day (BID) | ORAL | Status: DC
  Administered 2023-07-22 – 2023-07-24 (×5): 1 via ORAL
  Filled 2023-07-22 (×5): qty 1

## 2023-07-22 NOTE — Progress Notes (Signed)
 [facility] PROGRESS NOTE    Progress Note  Logan French  ZOX:[id number] DOB: [birth year] DOA: 07/15/2023 PCP: [name], MD     Brief Narrative:   Logan French is an 89 y.o. male past medical history significant for atrial fibrillation status post cardioversion in 2023, hyperlipidemia pernicious anemia, prostate cancer peripheral vascular disease status post angioplasty on March 2025, recently treated for left lower extremity cellulitis for which she completed treatment was discharged to rehab on 07/07/2023 comes back for left foot infection with ulceration and encephalopathic   Assessment/Plan:   Sepsis due to undetermined organism secondary to cellulitis Imaging does not show an abscess. Vascular surgery performed angiogram on 07/20/2023 status post intra-arterial lithotripsy above the left popliteal artery stenosis. Will need Doppler as an outpatient in 4 to 6 weeks. Has remained afebrile leukocytosis has resolved, antibiotics transition to oral Augmentin and doxycycline . PT evaluated the patient, inpatient rehab was consulted and assessing patient candidacy. Continue daily dressing changes vascular stressed the importance of activity walking and nutrition.  Peripheral arterial disease (HCC): Continue Plavix . He is on the [place] study.  Diabetes mellitus type 2 with hyperglycemia: A1c of 6.4, continue sliding scale insulin .  Essential hypertension: Blood pressure seems to be well-controlled.  BPH: Continue finasteride .  Dyslipidemia: Continue current medication.  Chronic atrial fibrillation: Continue metoprolol . Cont librexia  DVT prophylaxis: librexia Family Communication:none Status is: Inpatient Remains inpatient appropriate because: Right lower extremity cellulitis    Code Status:     Code Status Orders  (From admission, onward)           Start     Ordered   07/15/23 2104  Full code  Continuous       Question:  By:   Answer:  Other   07/15/23 2103           Code Status History     Date Active Date Inactive Code Status Order ID Comments User Context   06/25/2023 1546 07/07/2023 1634 Full Code [id number]  Geradine Kitten Inpatient   06/25/2023 1546 06/25/2023 1546 Full Code [id number]  Geradine Kitten Inpatient   06/17/2023 1401 06/25/2023 1540 Full Code [id number]  Lavanda Porter, MD Inpatient   05/17/2023 0911 05/17/2023 1647 Full Code [id number]  Philipp Brawn, MD Inpatient      Advance Directive Documentation    Flowsheet Row Most Recent Value  Type of Advance Directive Healthcare Power of Attorney  Pre-existing out of facility DNR order (yellow form or pink MOST form) --  "MOST" Form in Place? --         IV Access:   Peripheral IV   Procedures and diagnostic studies:   No results found.    Medical Consultants:   None.   Subjective:    Wm Riviello relates his pain is controlled.  Objective:    Vitals:   07/21/23 2203 07/22/23 0045 07/22/23 0740 07/22/23 1202  BP: 138/87 (!) 150/81 (!) 139/98 (!) 157/96  Pulse:  90 93 86  Resp:  18 16 18   Temp:  98 F (36.7 C) 98.4 F (36.9 C) 98.8 F (37.1 C)  TempSrc:  Oral Oral Oral  SpO2:      Weight:      Height:       SpO2: 96 %   Intake/Output Summary (Last 24 hours) at 07/22/2023 1219 Last data filed at 07/22/2023 1059 Gross per 24 hour  Intake 360 ml  Output 1350 ml  Net -990 ml   Filed Weights   07/15/23 2125  Weight: 101.6 kg    Exam: General exam: In no acute distress. Respiratory system: Good air movement and clear to auscultation. Cardiovascular system: S1 & S2 heard, RRR. No JVD. Gastrointestinal system: Abdomen is nondistended, soft and nontender.  Extremities: No pedal edema. Skin: Right lower extremity still very erythematous Psychiatry: Judgement and insight appear normal. Mood & affect appropriate.  Data Reviewed:    Labs: Basic Metabolic Panel: Recent Labs  Lab  07/15/23 1829 07/16/23 0906 07/17/23 0625 07/19/23 0716 07/21/23 0412  NA 132* 133* 132* 134* 136  K 3.6 3.7 4.1 3.6 4.1  CL 97* 99 99 101 103  CO2 23 21* 23 24 26   GLUCOSE 151* 141* 105* 114* 117*  BUN 20 18 18 11 12   CREATININE 1.26* 1.19 0.95 0.76 0.83  CALCIUM  8.3* 8.0* 8.0* 7.9* 8.2*  PHOS 2.3*  --   --   --   --    GFR Estimated Creatinine Clearance: 78.8 mL/min (by C-G formula based on SCr of 0.83 mg/dL). Liver Function Tests: Recent Labs  Lab 07/15/23 1829  AST 32  ALT 26  ALKPHOS 82  BILITOT 1.4*  PROT 6.1*  ALBUMIN 2.8*   No results for input(s): "LIPASE", "AMYLASE" in the last 168 hours. No results for input(s): "AMMONIA" in the last 168 hours. Coagulation profile Recent Labs  Lab 07/16/23 0906  INR 2.1*   COVID-19 Labs  No results for input(s): "DDIMER", "FERRITIN", "LDH", "CRP" in the last 72 hours.  Lab Results  Component Value Date   SARSCOV2NAA Not Detected 03/22/2019    CBC: Recent Labs  Lab 07/15/23 1829 07/16/23 0906 07/17/23 0625 07/19/23 0716 07/21/23 0412  WBC 15.3* 15.6* 16.1* 15.0* 10.5  NEUTROABS 13.7*  --   --   --   --   HGB 11.7* 11.1* 11.4* 11.6* 11.7*  HCT 36.3* 34.6* 36.9* 35.1* 36.5*  MCV 88.5 87.6 92.0 87.1 86.5  PLT 212 167 148* 205 238   Cardiac Enzymes: Recent Labs  Lab 07/15/23 1829  CKTOTAL 54   BNP (last 3 results) No results for input(s): "PROBNP" in the last 8760 hours. CBG: Recent Labs  Lab 07/21/23 1142 07/21/23 1707 07/21/23 2105 07/22/23 0743 07/22/23 1204  GLUCAP 142* 134* 151* 104* 138*   D-Dimer: No results for input(s): "DDIMER" in the last 72 hours. Hgb A1c: No results for input(s): "HGBA1C" in the last 72 hours. Lipid Profile: Recent Labs    07/21/23 0412  CHOL 59  HDL 18*  LDLCALC 27  TRIG 68  CHOLHDL 3.3   Thyroid  function studies: No results for input(s): "TSH", "T4TOTAL", "T3FREE", "THYROIDAB" in the last 72 hours.  Invalid input(s): "FREET3" Anemia work up: No  results for input(s): "VITAMINB12", "FOLATE", "FERRITIN", "TIBC", "IRON", "RETICCTPCT" in the last 72 hours. Sepsis Labs: Recent Labs  Lab 07/15/23 1835 07/15/23 2110 07/16/23 0906 07/17/23 0625 07/19/23 0716 07/21/23 0412  WBC  --   --  15.6* 16.1* 15.0* 10.5  LATICACIDVEN 3.7* 3.6*  --   --   --   --    Microbiology Recent Results (from the past 240 hours)  Blood culture (routine x 2)     Status: None   Collection Time: 07/15/23  6:29 PM   Specimen: BLOOD LEFT ARM  Result Value Ref Range Status   Specimen Description   Final    BLOOD LEFT ARM Performed at [facility], [address] [place], [place]  [address]    Special Requests   Final    BOTTLES DRAWN AEROBIC AND ANAEROBIC Blood Culture results may not be optimal due to an inadequate volume of blood received in culture bottles Performed at [facility], [address] [place], [place] [address]    Culture   Final    NO GROWTH 5 DAYS Performed at [facility], [address], [place], [place] [address]    Report Status 07/20/2023 FINAL  Final  Blood culture (routine x 2)     Status: None   Collection Time: 07/15/23  7:50 PM   Specimen: BLOOD RIGHT ARM  Result Value Ref Range Status   Specimen Description   Final    BLOOD RIGHT ARM Performed at [facility], [address] [place], [place] [address]    Special Requests   Final    BOTTLES DRAWN AEROBIC AND ANAEROBIC Blood Culture adequate volume Performed at [facility], [address] [place], [place] [address]    Culture   Final    NO GROWTH 5 DAYS Performed at [facility], [address], [place], [place] [address]    Report Status 07/20/2023 FINAL  Final     Medications:    amoxicillin -clavulanate  1 tablet Oral Q12H   clopidogrel   75 mg Oral Q breakfast   doxycycline   100 mg Oral Q12H   ezetimibe   10 mg Oral Daily   finasteride   5 mg Oral Daily   insulin  aspart  0-5 Units  Subcutaneous QHS   insulin  aspart  0-9 Units Subcutaneous TID WC   pantoprazole   40 mg Oral Daily   rosuvastatin   20 mg Oral Daily   senna-docusate  2 tablet Oral BID   sodium chloride  flush  10-40 mL Intracatheter Q12H   sodium chloride  flush  3 mL Intravenous Q12H   sodium chloride  flush  3 mL Intravenous Q12H   LIBREXIA-AF apixaban  or placebo  5 mg Oral BID   LIBREXIA-AF [id number] (Milvexian) or placebo  1 tablet Oral BID   Continuous Infusions:      LOS: 7 days   Macdonald Savoy  [facility]  07/22/2023, 12:19 PM

## 2023-07-22 NOTE — Plan of Care (Signed)
  Problem: Health Behavior/Discharge Planning: Goal: Ability to manage health-related needs will improve Outcome: Progressing   Problem: Clinical Measurements: Goal: Respiratory complications will improve Outcome: Progressing Goal: Cardiovascular complication will be avoided Outcome: Progressing   Problem: Nutrition: Goal: Adequate nutrition will be maintained Outcome: Progressing   Problem: Pain Managment: Goal: General experience of comfort will improve and/or be controlled Outcome: Progressing   Problem: Safety: Goal: Ability to remain free from injury will improve Outcome: Progressing   Problem: Cardiovascular: Goal: Ability to achieve and maintain adequate cardiovascular perfusion will improve Outcome: Progressing

## 2023-07-22 NOTE — Discharge Instructions (Signed)
   [facility]  Discharge Instructions  Lower Extremity Angiogram; Angioplasty/Stenting  Please refer to the following instructions for your post-procedure care. Your surgeon or physician assistant will discuss any changes with you.  Activity  Avoid lifting more than 8 pounds (1 gallons of milk) for 72 hours (3 days) after your procedure. You may walk as much as you can tolerate. It's OK to drive after 72 hours.  Bathing/Showering  You may shower the day after your procedure. If you have a bandage, you may remove it at 24- 48 hours. Clean your incision site with mild soap and water. Pat the area dry with a clean towel.  Diet  Resume your pre-procedure diet. There are no special food restrictions following this procedure. All patients with peripheral vascular disease should follow a low fat/low cholesterol diet. In order to heal from your surgery, it is CRITICAL to get adequate nutrition. Your body requires vitamins, minerals, and protein. Vegetables are the best source of vitamins and minerals. Vegetables also provide the perfect balance of protein. Processed food has little nutritional value, so try to avoid this.  Medications  Resume taking all of your medications unless your doctor tells you not to. If your incision is causing pain, you may take over-the-counter pain relievers such as acetaminophen (Tylenol)  Follow Up  Follow up will be arranged at the time of your procedure. You may have an office visit scheduled or may be scheduled for surgery. Ask your surgeon if you have any questions.  Please call us immediately for any of the following conditions: Severe or worsening pain your legs or feet at rest or with walking. Increased pain, redness, drainage at your groin puncture site. Fever of 101 degrees or higher. If you have any mild or slow bleeding from your puncture site: lie down, apply firm constant pressure over the area with a piece of  gauze or a clean wash cloth for 30 minutes- no peeking!, call 911 right away if you are still bleeding after 30 minutes, or if the bleeding is heavy and unmanageable.  Reduce your risk factors of vascular disease:  Stop smoking. If you would like help call [facility] at 1-800-QUIT-NOW (254-819-8059) or [place] at (250)353-2060. Manage your cholesterol Maintain a desired weight Control your diabetes Keep your blood pressure down  If you have any questions, please call the office at [phone]

## 2023-07-22 NOTE — Progress Notes (Addendum)
 Subjective  - POD # 2 status post left leg intervention  No complaints.  Ready to go to rehab   Physical Exam:  No significant change in bilateral lower extremity.  Left leg still with significant erythema.  Dressings are dry and intact.  No complications from groin cannulation       Assessment/Plan:  POD #3  Stable from vascular perspective.  Patient desires home health after discharge from rehab to manage dressing changes more frequently.  Currently he is getting once a week dressing changes.  Stressed the importance of activity, walking and nutrition  Logan French 07/22/2023 8:47 AM --  Vitals:   07/22/23 0045 07/22/23 0740  BP: (!) 150/81 (!) 139/98  Pulse: 90 93  Resp: 18 16  Temp: 98 F (36.7 C) 98.4 F (36.9 C)  SpO2:      Intake/Output Summary (Last 24 hours) at 07/22/2023 0847 Last data filed at 07/22/2023 0047 Gross per 24 hour  Intake 120 ml  Output 800 ml  Net -680 ml     Laboratory CBC    Component Value Date/Time   WBC 10.5 07/21/2023 0412   HGB 11.7 (L) 07/21/2023 0412   HGB 13.4 09/08/2021 1027   HCT 36.5 (L) 07/21/2023 0412   HCT 41.2 09/08/2021 1027   PLT 238 07/21/2023 0412   PLT 270 09/08/2021 1027    BMET    Component Value Date/Time   NA 136 07/21/2023 0412   NA 142 09/08/2021 1027   K 4.1 07/21/2023 0412   CL 103 07/21/2023 0412   CO2 26 07/21/2023 0412   GLUCOSE 117 (H) 07/21/2023 0412   BUN 12 07/21/2023 0412   BUN 23 09/08/2021 1027   CREATININE 0.83 07/21/2023 0412   CALCIUM  8.2 (L) 07/21/2023 0412   GFRNONAA >60 07/21/2023 0412   GFRAA 67 07/14/2019 1020    COAG Lab Results  Component Value Date   INR 2.1 (H) 07/16/2023   INR 1.6 (H) 06/18/2023   INR 2.0 (H) 06/17/2023   No results found for: "PTT"  Antibiotics Anti-infectives (From admission, onward)    Start     Dose/Rate Route Frequency Ordered Stop   07/16/23 1800  vancomycin  (VANCOREADY) IVPB 1500 mg/300 mL        1,500 mg 150 mL/hr over 120  Minutes Intravenous Every 24 hours 07/15/23 2106     07/16/23 1400  cefTRIAXone  (ROCEPHIN ) 2 g in sodium chloride  0.9 % 100 mL IVPB        2 g 200 mL/hr over 30 Minutes Intravenous Every 24 hours 07/16/23 1132     07/16/23 0600  piperacillin -tazobactam (ZOSYN ) IVPB 3.375 g  Status:  Discontinued        3.375 g 12.5 mL/hr over 240 Minutes Intravenous Every 8 hours 07/15/23 2106 07/16/23 1132   07/15/23 2115  vancomycin  (VANCOCIN ) IVPB 1000 mg/200 mL premix        1,000 mg 200 mL/hr over 60 Minutes Intravenous STAT 07/15/23 2106 07/15/23 2238   07/15/23 1915  ceFEPIme  (MAXIPIME ) 2 g in sodium chloride  0.9 % 100 mL IVPB        2 g 200 mL/hr over 30 Minutes Intravenous  Once 07/15/23 1903 07/15/23 2145   07/15/23 1915  metroNIDAZOLE  (FLAGYL ) IVPB 500 mg        500 mg 100 mL/hr over 60 Minutes Intravenous  Once 07/15/23 1903 07/15/23 2108   07/15/23 1915  vancomycin  (VANCOCIN ) IVPB 1000 mg/200 mL premix  1,000 mg 200 mL/hr over 60 Minutes Intravenous  Once 07/15/23 1903 07/15/23 2109        V. Marti Slates, M.D., [facility] [place] Office: [phone] Pager:  [phone]

## 2023-07-22 NOTE — Progress Notes (Signed)
  Inpatient Rehabilitation Admissions Coordinator   Met with patient  at bedside for rehab assessment. He was previously at [facility] for 11 days and discharged on 4/23 Mod I with [facility]. We discussed goals and expectations of a possible CIR admit. He prefers CIR for rehab. Wife can provide caregiver support. I spoke with son, Tyrone Gallop, by phone, and they are also looking into hired caregivers 4 to 5 her per day for a few days per week to assist with wound care, nursing care and follow up. I will discuss case with Rehab MD and follow up in the am to clarify candidacy to readmit. Please call me with any questions.   Jeannetta Millman, RN, MSN Rehab Admissions Coordinator [phone]

## 2023-07-22 NOTE — Progress Notes (Signed)
 Physical Therapy Treatment Patient Details Name: Logan French MRN: [id number] DOB: 1934/12/29 Today's Date: 07/22/2023   History of Present Illness Pt is an 89 y/o male admitted He presents to our facility with worsening left foot infection with ulceration, disorientatio, prior admission in March 2025 for RLE angioplasty, April for for RLE cellulitis and nonhealing wound. +for bacetermia. PMH: prostate cancer, anemia, HTN, HLD, a fib, b12 deficiency, PAD s/p vascular intervention.    PT Comments  Pt eager to work with therapy to progress his mobility. RN in room and pt request and given additional pain medication for participation with therapy. Pt is limited in safe mobility by increased L LE pain in presence of decreased strength, balance and endurance. Pt is contact guard for bed mobility, min Ax2 for transfers from elevated surface and modAx2 for transfers from lower recliner surface. Pt is able to perform bouts of ambulation in room with min Ax2. D/c plans remain appropriate. PT will continue to follow acutely.    If plan is discharge home, recommend the following: Assistance with cooking/housework;Assist for transportation;Help with stairs or ramp for entrance;A lot of help with bathing/dressing/bathroom;A lot of help with walking and/or transfers   Can travel by private vehicle      No  Equipment Recommendations  BSC/3in1;Wheelchair (measurements PT);Wheelchair cushion (measurements PT)       Precautions / Restrictions Precautions Precautions: Fall Recall of Precautions/Restrictions: Intact Restrictions Weight Bearing Restrictions Per Provider Order: No     Mobility  Bed Mobility Overal bed mobility: Needs Assistance Bed Mobility: Supine to Sit     Supine to sit: Contact guard, Used rails     General bed mobility comments: CGA from flat bed, used rails, increased time and effort to assist to EOB,    Transfers Overall transfer level: Needs assistance Equipment used:  Ambulation equipment used Transfers: Sit to/from Stand, Bed to chair/wheelchair/BSC Sit to Stand: From elevated surface, Min assist, +2 physical assistance, Mod assist   Step pivot transfers: Min assist, +2 physical assistance       General transfer comment: minAx2 and increased time for power up from elevated bed surface, multimodal cues for sequencing and upright posture, modAx2 for sit<>stand from lower recliner surface, increased cuing for eccentric control with lowering but unable to attain    Ambulation/Gait Ambulation/Gait assistance: Contact guard assist Gait Distance (Feet): 8 Feet (+6) Assistive device: Rolling walker (2 wheels) Gait Pattern/deviations: Trunk flexed, Antalgic, Decreased stride length, Step-to pattern Gait velocity: decreased Gait velocity interpretation: <1.31 ft/sec, indicative of household ambulator   General Gait Details: multimodal cues for upright posture and pelvic tilt, pt is contact guardx 2 for safety and close chair follow, after seated rest break pt is able to achieve an additional 6 feet of ambulation. pt is pleased with his progression         Balance Overall balance assessment: Needs assistance Sitting-balance support: No upper extremity supported, Feet supported Sitting balance-Leahy Scale: Fair     Standing balance support: Bilateral upper extremity supported, During functional activity, Reliant on assistive device for balance Standing balance-Leahy Scale: Poor Standing balance comment: reliant on RW for support due to painful L LE                            Communication Communication Communication: No apparent difficulties  Cognition Arousal: Alert Behavior During Therapy: WFL for tasks assessed/performed   PT - Cognitive impairments: Sequencing  PT - Cognition Comments: very verbose Following commands: Intact      Cueing Cueing Techniques: Verbal cues     General Comments General  comments (skin integrity, edema, etc.): VSS on RA, cues for not holding his breath with ambulation      Pertinent Vitals/Pain Pain Assessment Pain Assessment: Faces Faces Pain Scale: Hurts even more Pain Location: LLE with WB Pain Descriptors / Indicators: Discomfort, Grimacing, Guarding Pain Intervention(s): Limited activity within patient's tolerance, Monitored during session, Repositioned, Premedicated before session, Patient requesting pain meds-RN notified, RN gave pain meds during session     PT Goals (current goals can now be found in the care plan section) Acute Rehab PT Goals Patient Stated Goal: Get well, reduce pain PT Goal Formulation: With patient Time For Goal Achievement: 07/31/23 Potential to Achieve Goals: Fair Progress towards PT goals: Progressing toward goals    Frequency    Min 3X/week       AM-PAC PT "6 Clicks" Mobility   Outcome Measure  Help needed turning from your back to your side while in a flat bed without using bedrails?: A Little Help needed moving from lying on your back to sitting on the side of a flat bed without using bedrails?: A Little Help needed moving to and from a bed to a chair (including a wheelchair)?: Total Help needed standing up from a chair using your arms (e.g., wheelchair or bedside chair)?: A Lot Help needed to walk in hospital room?: Total Help needed climbing 3-5 steps with a railing? : Total 6 Click Score: 11    End of Session Equipment Utilized During Treatment: Gait belt Activity Tolerance: Patient tolerated treatment well Patient left: with call bell/phone within reach;in chair Nurse Communication: Mobility status PT Visit Diagnosis: Unsteadiness on feet (R26.81);Muscle weakness (generalized) (M62.81);Difficulty in walking, not elsewhere classified (R26.2);Pain;Other abnormalities of gait and mobility (R26.89) Pain - Right/Left: Left Pain - part of body: Leg;Ankle and joints of foot     Time: 1610-9604 PT  Time Calculation (min) (ACUTE ONLY): 23 min  Charges:    $Gait Training: 8-22 mins $Therapeutic Activity: 8-22 mins PT General Charges $$ ACUTE PT VISIT: 1 Visit                     Logan French PT, DPT Acute Rehabilitation Services Please use secure chat or  Call Office [phone]    Logan French [facility] 07/22/2023, 2:54 PM

## 2023-07-22 NOTE — PMR Pre-admission (Signed)
 PMR Admission Coordinator Pre-Admission Assessment  Patient: Logan French is an 89 y.o., male MRN: [id number] DOB: 1934/06/03 Height: 6' (182.9 cm) Weight: 101.6 kg  Insurance Information HMO:     PPO:      PCP:      IPA:      80/20:      OTHER:  PRIMARY: Medicare Part A and B      Policy#: [id number]      Subscriber: pt Benefits:  Phone #: passport one source     Name: 5/7 Eff. Date: a 09/14/2003 and b 08/15/2011     Deduct: $1676      Out of Pocket Max: none      Life Max: none CIR: 100%      SNF: 20 full days Outpatient: 80%     Co-Pay: 20% Home Health: 100%      Co-Pay: none DME: 80%     Co-Pay: 20% Providers: pt choice  SECONDARY: BCBS supplement      Policy#: [id number]  Financial Counselor:       Phone#:   The "Data Collection Information Summary" for patients in Inpatient Rehabilitation Facilities with attached "Privacy Act Statement-Health Care Records" was provided and verbally reviewed with: Patient  Emergency Contact Information Contact Information     Name Relation Home Work Mobile   [place] Son   [phone]   Celine Collard Spouse [phone]     Senora Dame   [phone]      Other Contacts   None on File    Current Medical History  Patient Admitting Diagnosis: sepsis, debility  History of Present Illness: Logan French is an 89 year old right-handed male with history of atrial fibrillation and cardioversion 10/07/2021 per Dr. Jerryl Morin, hyperlipidemia, syncope, hypertension, pernicious anemia, diabetes mellitus, prostate cancer, peripheral vascular disease status post angioplasty of the right popliteal artery 05/17/2023 per Dr. Susi Eric.  Patient did receive CIR 06/25/2023 - 07/07/2023 due to sepsis secondary to right lower extremity cellulitis/streptococcal bacteremia and was discharged home ambulating with a rolling walker contact-guard assist for eating to close supervision.  Presented 07/15/2023 with increasing lethargy and disorientation  and fever 102.6 as well as blistering of the left foot with ulceration as well as erythema spreading approximately up to the calf.    Admission chemistry showed WBC 15,300, sodium 132, chloride 97, glucose 151, creatinine 1.26, total bilirubin 1.4.  Blood cultures no growth to date.  X-rays of left foot showed dorsal swelling of the forefoot with no current bony destruction.  Patient placed on sepsis protocol status post 1 L lactated Ringer 's.  Patient lactic acidosis was 3.7 and chest x-ray unremarkable.  Placed on broad-spectrum antibiotics.  Underwent abdominal aortogram showing no significant renal artery stenosis visualized.  The infrarenal abdominal aorta widely patent without significant stenosis.  Bilateral common and external iliac arteries widely patent.  The left common femoral and profundofemoral artery calcified but patent without significant stenosis.  The popliteal artery at the level of the patella had luminal narrowing of approximately 60% circumferentially calcified.  The below-knee popliteal artery widely patent with single-vessel runoff via the peroneal artery providing perfusion to the foot.  Patient underwent successful shockwave intra-arterial lithotripsy of the 60-70% left above-knee popliteal artery stenosis 07/20/2023 per Dr. Charlotte Cookey.  Patient is currently completing a course of Augmentin /doxycycline .  Plan is to repeat Doppler as an outpatient in 4 to 6 weeks.  He continues with chronic Plavix  for peripheral vascular disease as prior to admission.  Blood pressure and heart rate  closely monitor with history of atrial fibrillation maintained on Librexa trial drug (comparing Eliquis  versus Milvexian-factor XI inhibitor).  Hospital course AKI resolved with gentle IV fluids latest creatinine 0.80.  Patient's mental status has cleared appears to be close to baseline.    Patient's medical record from [facility] has been reviewed by the rehabilitation admission coordinator and  physician.  Past Medical History  Past Medical History:  Diagnosis Date   A-fib (HCC)    B12 deficiency    Diverticulosis    Fatigue    History of colonic polyps    Hyperlipemia    Hypertension    Pernicious anemia    Prostate cancer (HCC)    Syncope    Syncope    Has the patient had major surgery during 100 days prior to admission? Yes  Family History   family history is not on file.  Current Medications  Current Facility-Administered Medications:    acetaminophen  (TYLENOL ) tablet 650 mg, 650 mg, Oral, Q4H PRN, Margherita Shell, MD, 650 mg at 07/26/23 1552   amLODipine (NORVASC) tablet 2.5 mg, 2.5 mg, Oral, Daily, Macdonald Savoy, MD, 2.5 mg at 07/27/23 4098   amoxicillin -clavulanate (AUGMENTIN ) 875-125 MG per tablet 1 tablet, 1 tablet, Oral, Q12H, Macdonald Savoy, MD, 1 tablet at 07/27/23 0902   clopidogrel  (PLAVIX ) tablet 75 mg, 75 mg, Oral, Q breakfast, Margherita Shell, MD, 75 mg at 07/27/23 1191   doxycycline  (VIBRA -TABS) tablet 100 mg, 100 mg, Oral, Q12H, Macdonald Savoy, MD, 100 mg at 07/27/23 4782   ezetimibe  (ZETIA ) tablet 10 mg, 10 mg, Oral, Daily, Goel, Hersh, MD, 10 mg at 07/27/23 9562   finasteride  (PROSCAR ) tablet 5 mg, 5 mg, Oral, Daily, Goel, Hersh, MD, 5 mg at 07/27/23 0902   hydrALAZINE  (APRESOLINE ) injection 5 mg, 5 mg, Intravenous, Q20 Min PRN, Margherita Shell, MD   insulin  aspart (novoLOG ) injection 0-5 Units, 0-5 Units, Subcutaneous, QHS, Goel, Hersh, MD   insulin  aspart (novoLOG ) injection 0-9 Units, 0-9 Units, Subcutaneous, TID WC, Goel, Hersh, MD, 1 Units at 07/27/23 1308   labetalol  (NORMODYNE ) injection 10 mg, 10 mg, Intravenous, Q10 min PRN, Margherita Shell, MD   metoprolol  succinate (TOPROL -XL) 24 hr tablet 12.5 mg, 12.5 mg, Oral, Q12H, Macdonald Savoy, MD   ondansetron  (ZOFRAN ) injection 4 mg, 4 mg, Intravenous, Q6H PRN, Margherita Shell, MD   oxyCODONE  (Oxy IR/ROXICODONE ) immediate release tablet 5 mg, 5 mg, Oral, Q6H PRN,  Goel, Hersh, MD, 5 mg at 07/27/23 6578   pantoprazole  (PROTONIX ) EC tablet 40 mg, 40 mg, Oral, Daily, Goel, Hersh, MD, 40 mg at 07/27/23 0903   polyethylene glycol (MIRALAX  / GLYCOLAX ) packet 17 g, 17 g, Oral, Daily PRN, Goel, Hersh, MD, 17 g at 07/23/23 4696   rosuvastatin  (CRESTOR ) tablet 20 mg, 20 mg, Oral, Daily, Goel, Hersh, MD, 20 mg at 07/27/23 0902   senna-docusate (Senokot-S) tablet 2 tablet, 2 tablet, Oral, BID, Goel, Hersh, MD, 2 tablet at 07/27/23 0902   sodium chloride  flush (NS) 0.9 % injection 10-40 mL, 10-40 mL, Intracatheter, Q12H, Macdonald Savoy, MD, 10 mL at 07/27/23 0914   sodium chloride  flush (NS) 0.9 % injection 10-40 mL, 10-40 mL, Intracatheter, PRN, Macdonald Savoy, MD   STUDY - LIBREXIA-AF - apixaban  5 mg or placebo capsule (PI-Sethi), 5 mg, Oral, BID, Sethi, Pramod S, MD, 5 mg at 07/26/23 2205   STUDY - LIBREXIA-AF - [id number] (Milvexian) 100 mg or placebo tablet (PI-Sethi), 1 tablet, Oral, BID, Janett Medin,  Alvera Jock, MD, 1 tablet at 07/26/23 2204  Patients Current Diet:  Diet Order             Diet regular Room service appropriate? Yes; Fluid consistency: Thin  Diet effective now                  Precautions / Restrictions Precautions Precautions: Fall, Other (comment) Precaution/Restrictions Comments: bilat foot wounds Restrictions Weight Bearing Restrictions Per Provider Order: No   Has the patient had 2 or more falls or a fall with injury in the past year? No  Prior Activity Level Community (5-7x/wk): works partime as Recruitment consultant. Recent d/c from CIR 4/23 Mod I rollator  Prior Functional Level Self Care: Did the patient need help bathing, dressing, using the toilet or eating? Needed some help  Indoor Mobility: Did the patient need assistance with walking from room to room (with or without device)? Independent  Stairs: Did the patient need assistance with internal or external stairs (with or without device)? Independent  Functional  Cognition: Did the patient need help planning regular tasks such as shopping or remembering to take medications? Independent  Patient Information Are you of Hispanic, Latino/a,or Spanish origin?: A. No, not of Hispanic, Latino/a, or Spanish origin What is your race?: A. White Do you need or want an interpreter to communicate with a doctor or health care staff?: 0. No  Patient's Response To:  Health Literacy and Transportation Is the patient able to respond to health literacy and transportation needs?: Yes Health Literacy - How often do you need to have someone help you when you read instructions, pamphlets, or other written material from your doctor or pharmacy?: Never In the past 12 months, has lack of transportation kept you from medical appointments or from getting medications?: No In the past 12 months, has lack of transportation kept you from meetings, work, or from getting things needed for daily living?: No  Home Assistive Devices / Equipment Home Equipment: Medical laboratory scientific officer - single point, Rollator (4 wheels), Agricultural consultant (2 wheels), Shower seat, Hand held shower head, Toilet riser, Grab bars - toilet, Grab bars - tub/shower  Prior Device Use: Indicate devices/aids used by the patient prior to current illness, exacerbation or injury? rollator  Current Functional Level Cognition  Orientation Level: Oriented X4    Extremity Assessment (includes Sensation/Coordination)  Upper Extremity Assessment: Generalized weakness  Lower Extremity Assessment: Defer to PT evaluation RLE Deficits / Details: Edematous, erythematous LLE Deficits / Details: Edematous, erythematous    ADLs  Overall ADL's : Needs assistance/impaired Eating/Feeding: Independent Grooming: Wash/dry hands, Wash/dry face, Contact guard assist, Standing Upper Body Bathing: Contact guard assist, Sitting Lower Body Bathing: Moderate assistance, Sitting/lateral leans Upper Body Dressing : Contact guard assist, Sitting Lower Body  Dressing: Total assistance Lower Body Dressing Details (indicate cue type and reason): assisted for socks Toilet Transfer: Moderate assistance, Ambulation, Rolling walker (2 wheels), Regular Toilet, BSC/3in1, Grab bars Toileting- Clothing Manipulation and Hygiene: Maximal assistance, Sit to/from stand Functional mobility during ADLs: Moderate assistance, Rolling walker (2 wheels) General ADL Comments: focused on mobility and transfers    Mobility  Overal bed mobility: Needs Assistance Bed Mobility: Supine to Sit, Sit to Supine Supine to sit: Contact guard, Used rails, HOB elevated Sit to supine: Contact guard assist, Used rails General bed mobility comments: increased time and effort    Transfers  Overall transfer level: Needs assistance Equipment used: Rolling walker (2 wheels) Transfers: Sit to/from Stand Sit to Stand: From elevated surface, Mod assist  Bed to/from chair/wheelchair/BSC transfer type:: Step pivot Step pivot transfers: Min assist, +2 physical assistance General transfer comment: increased time and multi attempts to power up from elevated bed and 3 in 1 over toilet    Ambulation / Gait / Stairs / Wheelchair Mobility  Ambulation/Gait Ambulation/Gait assistance: Editor, commissioning (Feet): 25 Feet (x 2) Assistive device: Rolling walker (2 wheels) Gait Pattern/deviations: Trunk flexed, Antalgic, Decreased stride length, Step-to pattern General Gait Details: leading with LLE, cues to look up, seated rest break between gait trials Gait velocity: decreased Gait velocity interpretation: <1.31 ft/sec, indicative of household ambulator    Posture / Balance Balance Overall balance assessment: Needs assistance Sitting-balance support: No upper extremity supported, Feet supported Sitting balance-Leahy Scale: Fair Standing balance support: Bilateral upper extremity supported, During functional activity, Reliant on assistive device for balance Standing balance-Leahy Scale:  Poor Standing balance comment: reliant on BUEsupport with RW    Special needs/care consideration New DM dx last admit with Hgb A1c 6.2 Was attending [facility] as OP     Bullins, Saverio Curling, RN  Registered Nurse WOC   Consult Note    Signed   Date of Service: 07/16/2023  5:33 AM   Signed      Show:Clear all [x] Written[x] Templated[] Copied  Added by: [x] Bullins, Saverio Curling, RN  [] Hover for details WOC Nurse Consult Note:patient is known to [facility] team from previous admissions with venous ulcers  and large serum filled blisters to L foot; followed by Dr. Julio Ohm; last seen in his office 07/15/2023  Reason for Consult: multiple wounds  Wound type: full thickness r/t venous insufficiency R lower leg and L foot  Pressure Injury POA: NA, not related to pressure  Measurement: see nursing flowsheet  Wound bed: R calf brown necrotic, R achilles 75% red 25% yellow; L dorsal foot, base of Great toe and 2nd digit pink and moist  Drainage (amount, consistency, odor) see nursing flowsheet  Periwound: L foot edema and erythema  Dressing procedure/placement/frequency:  Cleanse R leg wounds with Vashe wound cleanser [employer] [phone]), do not rinse and allow to air dry.  Apply Vashe moistened gauze to wound beds daily, cover with dry gauze, ABD pad and Kerlix roll gauze starting right above toes and ending right below knee.  May secure with Ace bandage wrapped in same fashion as Kerlix for light compression.   Cleanse L dorsal foot/toe wounds with Vashe wound cleanser.  Apply Vashe moistened gauze to wound beds daily, cover with dry gauze and secure with Kerlix roll gauze.     POC discussed with bedside nurse.  Any orders placed by Dr. Julio Ohm supercede these wound care orders.  WOC team will not follow. Re-consult if further needs arise.    Thank you,      Ronni Colace MSN, RN-BC, [employer] [phone]                        Previous Home Environment  Living  Arrangements: Spouse/significant other  Lives With: Spouse Available Help at Discharge: Family, Available 24 hours/day (son, Tyrone Gallop to hire 4-5 hrs per day caregiver asisstance) Type of Home: House Home Layout: Multi-level, Able to live on main level with bedroom/bathroom Alternate Level Stairs-Number of Steps: 2 steps down into kitchen/living room with railing. main level has bedroom/bathroom and an upstairs that pt does not have to use Home Access: Stairs to enter Entrance Stairs-Rails: Can reach both Entrance Stairs-Number of Steps: 3 STE front entry with R ledge  vs rail, planning to install rail Bathroom Shower/Tub: Walk-in shower, Door Bathroom Toilet: Standard Bathroom Accessibility: Yes How Accessible: Accessible via walker Home Care Services: No (was arranged OP Neuro rehab after d/c from CIR 4/23 but had not yet attended) Additional Comments: has [facility]  Discharge Living Setting Plans for Discharge Living Setting: Patient's home, Lives with (comment) (wife) Type of Home at Discharge: House Discharge Home Layout: Multi-level, Able to live on main level with bedroom/bathroom Alternate Level Stairs-Number of Steps: flight Discharge Home Access: Stairs to enter Entrance Stairs-Rails: Can reach both, Left, Right Entrance Stairs-Number of Steps: 3 steps Discharge Bathroom Shower/Tub: Walk-in shower, Door Discharge Bathroom Toilet: Standard Discharge Bathroom Accessibility: Yes How Accessible: Accessible via walker Does the patient have any problems obtaining your medications?: No  Social/Family/Support Systems Patient Roles: Spouse, Parent (Recruitment consultant) Solicitor Information: son, Tyrone Gallop, to be primary contact. Wife, Haskell Linker, [place] Anticipated Caregiver: wife and hired caregivers Anticipated Industrial/product designer Information: see contacts Ability/Limitations of Caregiver: wife HOH, can provide superivsion level Caregiver Availability: 24/7 Discharge Plan Discussed with Primary  Caregiver: Yes Is Caregiver In Agreement with Plan?: Yes Does Caregiver/Family have Issues with Lodging/Transportation while Pt is in Rehab?: No  Goals Patient/Family Goal for Rehab: Mod I to supervision PT, supervision to min with ADLS Expected length of stay: ELOS 7 to 10 days Additional Information: Son, Tyrone Gallop, asking for a list of vendors to arrange caregiver supports 4 to 5 hrs per day to asisst with wound dressing changes, etc Pt/Family Agrees to Admission and willing to participate: Yes Program Orientation Provided & Reviewed with Pt/Caregiver Including Roles  & Responsibilities: Yes Additional Information Needs: CIR for 11 days, d/c 4/23 Mod I with rollator. Patietn needs to keep legs elevated with "toes above his nose" at all times Information Needs to be Provided By: private duty caregiver vendors  Decrease burden of Care through IP rehab admission: n/a  Possible need for SNF placement upon discharge: not anticipated  Patient Condition: I have reviewed medical records from [facility], spoken with patient and son. I met with patient at the bedside and discussed via phone for inpatient rehabilitation assessment.  Patient will benefit from ongoing PT and OT, can actively participate in 3 hours of therapy a day 5 days of the week, and can make measurable gains during the admission.  Patient will also benefit from the coordinated team approach during an Inpatient Acute Rehabilitation admission.  The patient will receive intensive therapy as well as Rehabilitation physician, nursing, social worker, and care management interventions.  Due to bladder management, bowel management, safety, skin/wound care, disease management, medication administration, pain management, and patient education the patient requires 24 hour a day rehabilitation nursing.  The patient is currently min assist overall with mobility and basic ADLs.  Discharge setting and therapy post discharge at home with home  health is anticipated.  Patient has agreed to participate in the Acute Inpatient Rehabilitation Program and will admit today.  Preadmission Screen Completed By:  Tanya Fantasia RN MSN 07/27/2023 10:04 AM ______________________________________________________________________   Discussed status with Dr. Rachel Budds on 07/27/2023 at 1004 and received approval for admission today.  Admission Coordinator:  Tanya Fantasia, RN MSN time 1004 Date 07/27/2023   Assessment/Plan: Diagnosis: debility after sepsis Does the need for close, 24 hr/day Medical supervision in concert with the patient's rehab needs make it unreasonable for this patient to be served in a less intensive setting? Yes Co-Morbidities requiring supervision/potential complications: a fib, dm, prostate cancer, PVD Due to  bladder management, bowel management, safety, skin/wound care, disease management, medication administration, pain management, and patient education, does the patient require 24 hr/day rehab nursing? Yes Does the patient require coordinated care of a physician, rehab nurse, PT, OT to address physical and functional deficits in the context of the above medical diagnosis(es)? Yes Addressing deficits in the following areas: balance, endurance, locomotion, strength, transferring, bowel/bladder control, bathing, dressing, feeding, grooming, toileting, and psychosocial support Can the patient actively participate in an intensive therapy program of at least 3 hrs of therapy 5 days a week? Yes The potential for patient to make measurable gains while on inpatient rehab is excellent Anticipated functional outcomes upon discharge from inpatient rehab: modified independent and supervision PT, supervision and min assist OT, n/a SLP Estimated rehab length of stay to reach the above functional goals is: 7-10 days Anticipated discharge destination: Home 10. Overall Rehab/Functional Prognosis: excellent   MD Signature: Rawland Caddy, MD, [facility] Physical Medicine & Rehabilitation Medical Director Rehabilitation Services 07/27/2023

## 2023-07-23 DIAGNOSIS — L0390 Cellulitis, unspecified: Secondary | ICD-10-CM | POA: Diagnosis not present

## 2023-07-23 DIAGNOSIS — A419 Sepsis, unspecified organism: Secondary | ICD-10-CM | POA: Diagnosis not present

## 2023-07-23 LAB — CBC
HCT: 37.6 % — ABNORMAL LOW (ref 39.0–52.0)
Hemoglobin: 12.4 g/dL — ABNORMAL LOW (ref 13.0–17.0)
MCH: 28.2 pg (ref 26.0–34.0)
MCHC: 33 g/dL (ref 30.0–36.0)
MCV: 85.6 fL (ref 80.0–100.0)
Platelets: 318 10*3/uL (ref 150–400)
RBC: 4.39 MIL/uL (ref 4.22–5.81)
RDW: 16.4 % — ABNORMAL HIGH (ref 11.5–15.5)
WBC: 10.6 10*3/uL — ABNORMAL HIGH (ref 4.0–10.5)
nRBC: 0 % (ref 0.0–0.2)

## 2023-07-23 LAB — GLUCOSE, CAPILLARY
Glucose-Capillary: 112 mg/dL — ABNORMAL HIGH (ref 70–99)
Glucose-Capillary: 163 mg/dL — ABNORMAL HIGH (ref 70–99)
Glucose-Capillary: 132 mg/dL — ABNORMAL HIGH (ref 70–99)
Glucose-Capillary: 152 mg/dL — ABNORMAL HIGH (ref 70–99)

## 2023-07-23 LAB — BASIC METABOLIC PANEL WITH GFR
Anion gap: 9 (ref 5–15)
BUN: 12 mg/dL (ref 8–23)
CO2: 24 mmol/L (ref 22–32)
Calcium: 8.1 mg/dL — ABNORMAL LOW (ref 8.9–10.3)
Chloride: 102 mmol/L (ref 98–111)
Creatinine, Ser: 0.96 mg/dL (ref 0.61–1.24)
GFR, Estimated: >60 mL/min (ref >60)
Glucose, Bld: 122 mg/dL — ABNORMAL HIGH (ref 70–99)
Potassium: 3.8 mmol/L (ref 3.5–5.1)
Sodium: 135 mmol/L (ref 135–145)

## 2023-07-23 NOTE — Progress Notes (Signed)
 Inpatient Rehabilitation Admissions Coordinator   CIR bed is not available to admit to today or Saturday/Sunday. I met with patient and called his son, Tyrone Gallop, and they are aware. I am hopeful for a bed first of the week. If bed becomes unexpectedly available over the weekend, we would pursue admit.  Jeannetta Millman, RN, MSN Rehab Admissions Coordinator [phone] 07/23/2023 11:22 AM

## 2023-07-23 NOTE — Progress Notes (Signed)
 Physical Therapy Treatment Patient Details Name: Logan French MRN: [id number] DOB: March 31, 1934 Today's Date: 07/23/2023   History of Present Illness Pt is an 89 y/o male admitted He presents to our facility with worsening left foot infection with ulceration, disorientatio, prior admission in March 2025 for RLE angioplasty, April for for RLE cellulitis and nonhealing wound. +for bacetermia. PMH: prostate cancer, anemia, HTN, HLD, a fib, b12 deficiency, PAD s/p vascular intervention.    PT Comments  Pt continues to be motivated to get back to independent ambulation. Pt limited from achieving this goal due to increased L LE pain, in presence of decreased strength and balance. Pt is CGA for bed mobility, min Ax2 for coming to standing from a very elevated position and min to CGAx2 with close chair follow for 2 bouts of ambulation in the room.  D/c plans remain appropriate at this time. PT will continue to follow acutely.    If plan is discharge home, recommend the following: Assistance with cooking/housework;Assist for transportation;Help with stairs or ramp for entrance;A lot of help with bathing/dressing/bathroom;A lot of help with walking and/or transfers   Can travel by private vehicle      No  Equipment Recommendations  BSC/3in1;Wheelchair (measurements PT);Wheelchair cushion (measurements PT)       Precautions / Restrictions Precautions Precautions: Fall Recall of Precautions/Restrictions: Intact Restrictions Weight Bearing Restrictions Per Provider Order: No     Mobility  Bed Mobility Overal bed mobility: Needs Assistance Bed Mobility: Supine to Sit     Supine to sit: Contact guard, Used rails     General bed mobility comments: CGA from flat bed, used rails, increased time and effort to assist to EOB,    Transfers Overall transfer level: Needs assistance Equipment used: Ambulation equipment used Transfers: Sit to/from Stand, Bed to chair/wheelchair/BSC Sit to Stand:  From elevated surface, Min assist, +2 physical assistance           General transfer comment: minAx2 and increased time for power up from elevated bed surface, multimodal cues for sequencing and upright posture,    Ambulation/Gait Ambulation/Gait assistance: Contact guard assist, +2 safety/equipment, Min assist Gait Distance (Feet): 15 Feet (x2) Assistive device: Rolling walker (2 wheels) Gait Pattern/deviations: Trunk flexed, Antalgic, Decreased stride length, Step-to pattern Gait velocity: decreased Gait velocity interpretation: <1.31 ft/sec, indicative of household ambulator   General Gait Details: continues to require increased cuing for upright posture, pt requires min progressing to contact guard assistand for steadying with gait.         Balance Overall balance assessment: Needs assistance Sitting-balance support: No upper extremity supported, Feet supported Sitting balance-Leahy Scale: Fair     Standing balance support: Bilateral upper extremity supported, During functional activity, Reliant on assistive device for balance Standing balance-Leahy Scale: Poor Standing balance comment: reliant on RW for support due to painful L LE                            Communication Communication Communication: No apparent difficulties  Cognition Arousal: Alert Behavior During Therapy: WFL for tasks assessed/performed   PT - Cognitive impairments: Sequencing                       PT - Cognition Comments: very verbose Following commands: Intact      Cueing Cueing Techniques: Verbal cues  Exercises General Exercises - Lower Extremity Ankle Circles/Pumps: AROM, Both, 15 reps Hip ABduction/ADduction: AROM, Both, 5 reps,  Supine    General Comments General comments (skin integrity, edema, etc.): VSS on RA      Pertinent Vitals/Pain Pain Assessment Pain Assessment: Faces Faces Pain Scale: Hurts even more Pain Location: LLE with WB Pain Descriptors /  Indicators: Discomfort, Grimacing, Guarding Pain Intervention(s): Limited activity within patient's tolerance, Monitored during session, Repositioned     PT Goals (current goals can now be found in the care plan section) Acute Rehab PT Goals Patient Stated Goal: Get well, reduce pain PT Goal Formulation: With patient Time For Goal Achievement: 07/31/23 Potential to Achieve Goals: Fair Progress towards PT goals: Progressing toward goals    Frequency    Min 3X/week           Co-evaluation PT/OT/SLP Co-Evaluation/Treatment: Yes Reason for Co-Treatment: For patient/therapist safety PT goals addressed during session: Mobility/safety with mobility        AM-PAC PT "6 Clicks" Mobility   Outcome Measure  Help needed turning from your back to your side while in a flat bed without using bedrails?: A Little Help needed moving from lying on your back to sitting on the side of a flat bed without using bedrails?: A Little Help needed moving to and from a bed to a chair (including a wheelchair)?: Total Help needed standing up from a chair using your arms (e.g., wheelchair or bedside chair)?: A Lot Help needed to walk in hospital room?: Total Help needed climbing 3-5 steps with a railing? : Total 6 Click Score: 11    End of Session Equipment Utilized During Treatment: Gait belt Activity Tolerance: Patient tolerated treatment well Patient left: with call bell/phone within reach;in chair Nurse Communication: Mobility status PT Visit Diagnosis: Unsteadiness on feet (R26.81);Muscle weakness (generalized) (M62.81);Difficulty in walking, not elsewhere classified (R26.2);Pain;Other abnormalities of gait and mobility (R26.89) Pain - Right/Left: Left Pain - part of body: Leg;Ankle and joints of foot     Time: 4098-1191 PT Time Calculation (min) (ACUTE ONLY): 24 min  Charges:    $Gait Training: 8-22 mins PT General Charges $$ ACUTE PT VISIT: 1 Visit                     [name]  B. [name] PT, DPT Acute Rehabilitation Services Please use secure chat or  Call Office [phone]    [name] Fleet 07/23/2023, 1:18 PM

## 2023-07-23 NOTE — Plan of Care (Signed)
  Problem: Clinical Measurements: Goal: Respiratory complications will improve Outcome: Progressing Goal: Cardiovascular complication will be avoided Outcome: Progressing   Problem: Activity: Goal: Risk for activity intolerance will decrease Outcome: Progressing   Problem: Nutrition: Goal: Adequate nutrition will be maintained Outcome: Progressing   Problem: Elimination: Goal: Will not experience complications related to urinary retention Outcome: Progressing   Problem: Pain Managment: Goal: General experience of comfort will improve and/or be controlled Outcome: Progressing   Problem: Safety: Goal: Ability to remain free from injury will improve Outcome: Progressing   Problem: Activity: Goal: Ability to return to baseline activity level will improve Outcome: Progressing   Problem: Cardiovascular: Goal: Ability to achieve and maintain adequate cardiovascular perfusion will improve Outcome: Progressing

## 2023-07-23 NOTE — Progress Notes (Signed)
 Patient had a small bowel movement, clear mucous-like appearance. Patient denies abdominal pain or any history of mucous stools. Notified Dr. Michell Ahumada. Awaiting response.  Davi Kroon, RN

## 2023-07-23 NOTE — Progress Notes (Signed)
 [facility] PROGRESS NOTE    Progress Note  Logan French  ZOX:[id number] DOB: [birth year] DOA: 07/15/2023 PCP: [name], MD     Brief Narrative:   Logan French is an 89 y.o. male past medical history significant for atrial fibrillation status post cardioversion in 2023, hyperlipidemia pernicious anemia, prostate cancer peripheral vascular disease status post angioplasty on March 2025, recently treated for left lower extremity cellulitis for which she completed treatment was discharged to rehab on 07/07/2023 comes back for left foot infection with ulceration and encephalopathic   Assessment/Plan:   Sepsis due to undetermined organism secondary to cellulitis Imaging does not show an abscess. Vascular surgery performed angiogram on 07/20/2023 status post intra-arterial lithotripsy above the left popliteal artery stenosis. Will need Doppler as an outpatient in 4 to 6 weeks. Has remained afebrile leukocytosis has resolved, antibiotics transition to oral Augmentin  and doxycycline .  Completed 14-day course. PT evaluated the patient, inpatient rehab was consulted and assessing patient candidacy. Continue daily dressing changes vascular stressed the importance of activity walking and nutrition. Await inpatient rehab bed.  Peripheral arterial disease (HCC): Continue Plavix . He is on the [place] study.  Diabetes mellitus type 2 with hyperglycemia: A1c of 6.4, continue sliding scale insulin .  Essential hypertension: Blood pressure seems to be well-controlled.  BPH: Continue finasteride .  Dyslipidemia: Continue current medication.  Chronic atrial fibrillation: Continue metoprolol . Cont librexia  DVT prophylaxis: librexia Family Communication:none Status is: Inpatient Remains inpatient appropriate because: Right lower extremity cellulitis    Code Status:     Code Status Orders  (From admission, onward)           Start     Ordered   07/15/23  2104  Full code  Continuous       Question:  By:  Answer:  Other   07/15/23 2103           Code Status History     Date Active Date Inactive Code Status Order ID Comments User Context   06/25/2023 1546 07/07/2023 1634 Full Code [id number]  Geradine Kitten Inpatient   06/25/2023 1546 06/25/2023 1546 Full Code [id number]  Geradine Kitten Inpatient   06/17/2023 1401 06/25/2023 1540 Full Code [id number]  Lavanda Porter, MD Inpatient   05/17/2023 0911 05/17/2023 1647 Full Code [id number]  Philipp Brawn, MD Inpatient      Advance Directive Documentation    Flowsheet Row Most Recent Value  Type of Advance Directive Healthcare Power of Attorney  Pre-existing out of facility DNR order (yellow form or pink MOST form) --  "MOST" Form in Place? --         IV Access:   Peripheral IV   Procedures and diagnostic studies:   No results found.    Medical Consultants:   None.   Subjective:    Logan French no complaints was able to walk with minimal pain this morning.  Objective:    Vitals:   07/22/23 2351 07/23/23 0349 07/23/23 0729 07/23/23 1149  BP: (!) 151/89 (!) 137/96 (!) 160/74 (!) 130/95  Pulse: 85 78 81 76  Resp: 16 18 18 18   Temp: (!) 97.5 F (36.4 C) 98.5 F (36.9 C) 98.6 F (37 C) 98.2 F (36.8 C)  TempSrc: Oral Oral Oral Oral  SpO2: 96% 96%    Weight:      Height:       SpO2: 96 %   Intake/Output Summary (Last 24 hours) at 07/23/2023 1207  Last data filed at 07/23/2023 0910 Gross per 24 hour  Intake 1320 ml  Output 2550 ml  Net -1230 ml   Filed Weights   07/15/23 2125  Weight: 101.6 kg    Exam: General exam: In no acute distress. Respiratory system: Good air movement and clear to auscultation. Cardiovascular system: S1 & S2 heard, RRR. No JVD. Gastrointestinal system: Abdomen is nondistended, soft and nontender.  Extremities: No pedal edema. Skin: Erythema is improving.  Swelling is down Psychiatry: Judgement and insight  appear normal. Mood & affect appropriate.  Data Reviewed:    Labs: Basic Metabolic Panel: Recent Labs  Lab 07/17/23 0625 07/19/23 0716 07/21/23 0412 07/23/23 0426  NA 132* 134* 136 135  K 4.1 3.6 4.1 3.8  CL 99 101 103 102  CO2 23 24 26 24   GLUCOSE 105* 114* 117* 122*  BUN 18 11 12 12   CREATININE 0.95 0.76 0.83 0.96  CALCIUM  8.0* 7.9* 8.2* 8.1*   GFR Estimated Creatinine Clearance: 68.1 mL/min (by C-G formula based on SCr of 0.96 mg/dL). Liver Function Tests: No results for input(s): "AST", "ALT", "ALKPHOS", "BILITOT", "PROT", "ALBUMIN" in the last 168 hours.  No results for input(s): "LIPASE", "AMYLASE" in the last 168 hours. No results for input(s): "AMMONIA" in the last 168 hours. Coagulation profile No results for input(s): "INR", "PROTIME" in the last 168 hours.  COVID-19 Labs  No results for input(s): "DDIMER", "FERRITIN", "LDH", "CRP" in the last 72 hours.  Lab Results  Component Value Date   SARSCOV2NAA Not Detected 03/22/2019    CBC: Recent Labs  Lab 07/17/23 0625 07/19/23 0716 07/21/23 0412 07/23/23 0426  WBC 16.1* 15.0* 10.5 10.6*  HGB 11.4* 11.6* 11.7* 12.4*  HCT 36.9* 35.1* 36.5* 37.6*  MCV 92.0 87.1 86.5 85.6  PLT 148* 205 238 318   Cardiac Enzymes: No results for input(s): "CKTOTAL", "CKMB", "CKMBINDEX", "TROPONINI" in the last 168 hours.  BNP (last 3 results) No results for input(s): "PROBNP" in the last 8760 hours. CBG: Recent Labs  Lab 07/22/23 1204 07/22/23 1614 07/22/23 2031 07/23/23 0734 07/23/23 1155  GLUCAP 138* 141* 151* 112* 163*   D-Dimer: No results for input(s): "DDIMER" in the last 72 hours. Hgb A1c: No results for input(s): "HGBA1C" in the last 72 hours. Lipid Profile: Recent Labs    07/21/23 0412  CHOL 59  HDL 18*  LDLCALC 27  TRIG 68  CHOLHDL 3.3   Thyroid  function studies: No results for input(s): "TSH", "T4TOTAL", "T3FREE", "THYROIDAB" in the last 72 hours.  Invalid input(s): "FREET3" Anemia work  up: No results for input(s): "VITAMINB12", "FOLATE", "FERRITIN", "TIBC", "IRON", "RETICCTPCT" in the last 72 hours. Sepsis Labs: Recent Labs  Lab 07/17/23 0625 07/19/23 0716 07/21/23 0412 07/23/23 0426  WBC 16.1* 15.0* 10.5 10.6*   Microbiology Recent Results (from the past 240 hours)  Blood culture (routine x 2)     Status: None   Collection Time: 07/15/23  6:29 PM   Specimen: BLOOD LEFT ARM  Result Value Ref Range Status   Specimen Description   Final    BLOOD LEFT ARM Performed at [facility], [address] [place], [place] [address]    Special Requests   Final    BOTTLES DRAWN AEROBIC AND ANAEROBIC Blood Culture results may not be optimal due to an inadequate volume of blood received in culture bottles Performed at [facility], [address], [place], [place] [address]    Culture   Final    NO  GROWTH 5 DAYS Performed at [facility], [address], [place], [place] [address]    Report Status 07/20/2023 FINAL  Final  Blood culture (routine x 2)     Status: None   Collection Time: 07/15/23  7:50 PM   Specimen: BLOOD RIGHT ARM  Result Value Ref Range Status   Specimen Description   Final    BLOOD RIGHT ARM Performed at [facility], [address] [place], [place] [address]    Special Requests   Final    BOTTLES DRAWN AEROBIC AND ANAEROBIC Blood Culture adequate volume Performed at [facility], [address] [place], [place] [address]    Culture   Final    NO GROWTH 5 DAYS Performed at [facility], [address] [place], [place] [address]    Report Status 07/20/2023 FINAL  Final     Medications:    amoxicillin -clavulanate  1 tablet Oral Q12H   clopidogrel   75 mg Oral Q breakfast   doxycycline   100 mg Oral Q12H   ezetimibe   10 mg Oral Daily   finasteride   5 mg Oral Daily   insulin  aspart  0-5 Units Subcutaneous QHS   insulin  aspart  0-9 Units Subcutaneous TID WC    pantoprazole   40 mg Oral Daily   rosuvastatin   20 mg Oral Daily   senna-docusate  2 tablet Oral BID   sodium chloride  flush  10-40 mL Intracatheter Q12H   sodium chloride  flush  3 mL Intravenous Q12H   sodium chloride  flush  3 mL Intravenous Q12H   LIBREXIA-AF apixaban  or placebo  5 mg Oral BID   LIBREXIA-AF [id number] (Milvexian) or placebo  1 tablet Oral BID   Continuous Infusions:      LOS: 8 days   Logan French  [facility]  07/23/2023, 12:07 PM

## 2023-07-23 NOTE — Progress Notes (Signed)
 Occupational Therapy Treatment Patient Details Name: Logan French MRN: [id number] DOB: 05-Mar-1935 Today's Date: 07/23/2023   History of present illness Pt is an 89 y/o male admitted He presents to our facility with worsening left foot infection with ulceration, disorientatio, prior admission in March 2025 for RLE angioplasty, April for for RLE cellulitis and nonhealing wound. +for bacetermia. PMH: prostate cancer, anemia, HTN, HLD, a fib, b12 deficiency, PAD s/p vascular intervention.   OT comments  Patient continues to make good gains with OT treatment and is very motivated to increase independence with mobility and self care. Patient making progress with bed mobility requiring CGA to get to EOB and min assist of 2 for sit to stands and transfers. Patient will benefit from intensive inpatient follow-up therapy, >3 hours/day. Acute OT to continue to follow to address established goals to facilitate DC to next venue of care.       If plan is discharge home, recommend the following:  A lot of help with walking and/or transfers;A lot of help with bathing/dressing/bathroom;Assistance with cooking/housework;Assist for transportation;Help with stairs or ramp for entrance   Equipment Recommendations  Other (comment) (defer)    Recommendations for Other Services      Precautions / Restrictions Precautions Precautions: Fall Recall of Precautions/Restrictions: Intact Restrictions Weight Bearing Restrictions Per Provider Order: No       Mobility Bed Mobility Overal bed mobility: Needs Assistance Bed Mobility: Supine to Sit     Supine to sit: Contact guard, Used rails     General bed mobility comments: increased time an duse of rails to get to EOB    Transfers Overall transfer level: Needs assistance Equipment used: Rolling walker (2 wheels) Transfers: Sit to/from Stand, Bed to chair/wheelchair/BSC Sit to Stand: From elevated surface, Min assist, +2 physical assistance            General transfer comment: minAx2 and increased time for power up from elevated bed surface, multimodal cues for sequencing and upright posture,     Balance Overall balance assessment: Needs assistance Sitting-balance support: No upper extremity supported, Feet supported Sitting balance-Leahy Scale: Fair     Standing balance support: Bilateral upper extremity supported, During functional activity, Reliant on assistive device for balance Standing balance-Leahy Scale: Poor Standing balance comment: reliant on BUEsupport with RW                           ADL either performed or assessed with clinical judgement   ADL Overall ADL's : Needs assistance/impaired     Grooming: Wash/dry hands;Wash/dry face;Supervision/safety;Standing               Lower Body Dressing: Total assistance Lower Body Dressing Details (indicate cue type and reason): assisted for socks               General ADL Comments: focused on mobility and transfers    Extremity/Trunk Assessment              Vision       Perception     Praxis     Communication Communication Communication: No apparent difficulties   Cognition Arousal: Alert Behavior During Therapy: WFL for tasks assessed/performed                                 Following commands: Intact Following commands impaired: Follows one step commands with increased time  Cueing   Cueing Techniques: Verbal cues  Exercises      Shoulder Instructions       General Comments VSS on RA    Pertinent Vitals/ Pain       Pain Assessment Pain Assessment: Faces Faces Pain Scale: Hurts even more Pain Location: LLE with WB Pain Descriptors / Indicators: Discomfort, Grimacing, Guarding Pain Intervention(s): Limited activity within patient's tolerance, Repositioned, Monitored during session, Premedicated before session  Home Living                                          Prior  Functioning/Environment              Frequency  Min 2X/week        Progress Toward Goals  OT Goals(current goals can now be found in the care plan section)  Progress towards OT goals: Progressing toward goals  Acute Rehab OT Goals Patient Stated Goal: to get better OT Goal Formulation: With patient Time For Goal Achievement: 08/04/23 Potential to Achieve Goals: Good ADL Goals Pt Will Perform Lower Body Bathing: with modified independence;sit to/from stand;sitting/lateral leans Pt Will Perform Upper Body Dressing: with supervision Pt Will Perform Lower Body Dressing: with modified independence;sit to/from stand;sitting/lateral leans Pt Will Transfer to Toilet: with supervision;ambulating Pt Will Perform Toileting - Clothing Manipulation and hygiene: with supervision  Plan      Co-evaluation    PT/OT/SLP Co-Evaluation/Treatment: Yes Reason for Co-Treatment: For patient/therapist safety PT goals addressed during session: Mobility/safety with mobility OT goals addressed during session: ADL's and self-care;Proper use of Adaptive equipment and DME      AM-PAC OT "6 Clicks" Daily Activity     Outcome Measure   Help from another person eating meals?: None Help from another person taking care of personal grooming?: A Little Help from another person toileting, which includes using toliet, bedpan, or urinal?: Total Help from another person bathing (including washing, rinsing, drying)?: A Lot Help from another person to put on and taking off regular upper body clothing?: A Little Help from another person to put on and taking off regular lower body clothing?: Total 6 Click Score: 14    End of Session Equipment Utilized During Treatment: Gait belt;Rolling walker (2 wheels)  OT Visit Diagnosis: Unsteadiness on feet (R26.81);Other abnormalities of gait and mobility (R26.89);Muscle weakness (generalized) (M62.81);Pain Pain - Right/Left: Left Pain - part of body: Ankle and  joints of foot;Leg   Activity Tolerance Patient tolerated treatment well   Patient Left in chair;with call bell/phone within reach   Nurse Communication Mobility status        Time: 1610-9604 OT Time Calculation (min): 24 min  Charges: OT General Charges $OT Visit: 1 Visit OT Treatments $Therapeutic Activity: 8-22 mins  Anitra Barn, OTA Acute Rehabilitation Services  Office [phone]   Jovita Nipper 07/23/2023, 3:08 PM

## 2023-07-24 DIAGNOSIS — A419 Sepsis, unspecified organism: Secondary | ICD-10-CM | POA: Diagnosis not present

## 2023-07-24 LAB — GLUCOSE, CAPILLARY
Glucose-Capillary: 141 mg/dL — ABNORMAL HIGH (ref 70–99)
Glucose-Capillary: 124 mg/dL — ABNORMAL HIGH (ref 70–99)
Glucose-Capillary: 130 mg/dL — ABNORMAL HIGH (ref 70–99)
Glucose-Capillary: 161 mg/dL — ABNORMAL HIGH (ref 70–99)

## 2023-07-24 MED ORDER — AMOXICILLIN-POT CLAVULANATE 875-125 MG PO TABS
1.0000 | ORAL_TABLET | Freq: Two times a day (BID) | ORAL | Status: DC
  Administered 2023-07-24 – 2023-07-27 (×6): 1 via ORAL
  Filled 2023-07-24 (×6): qty 1

## 2023-07-24 MED ORDER — DOXYCYCLINE HYCLATE 100 MG PO TABS
100.0000 mg | ORAL_TABLET | Freq: Two times a day (BID) | ORAL | Status: DC
  Administered 2023-07-24 – 2023-07-27 (×6): 100 mg via ORAL
  Filled 2023-07-24 (×6): qty 1

## 2023-07-24 NOTE — Progress Notes (Signed)
 [facility] PROGRESS NOTE    Progress Note  Logan French  KGM:[id number] DOB: 1934/07/15 DOA: 07/15/2023 PCP: [name], MD     Brief Narrative:   Logan French is an 89 y.o. male past medical history significant for atrial fibrillation status post cardioversion in 2023, hyperlipidemia pernicious anemia, prostate cancer peripheral vascular disease status post angioplasty on March 2025, recently treated for left lower extremity cellulitis for which she completed treatment was discharged to rehab on 07/07/2023 comes back for left foot infection with ulceration and encephalopathic   Assessment/Plan:   Sepsis due to undetermined organism secondary to cellulitis Imaging does not show an abscess. Vascular surgery performed angiogram on 07/20/2023 status post intra-arterial lithotripsy above the left popliteal artery stenosis. Will need Doppler as an outpatient in 4 to 6 weeks. Has remained afebrile no cytosis, continue oral Augmentin  and doxycycline  for 7 additional days. PT evaluated the patient, inpatient rehab was consulted and assessing patient candidacy. Continue daily dressing changes vascular stressed the importance of activity walking and nutrition. Await inpatient rehab bed. Patient is medically stable.  Peripheral arterial disease (HCC): Continue Plavix . He is on the [place] study.  Diabetes mellitus type 2 with hyperglycemia: A1c of 6.4, continue sliding scale insulin .  Essential hypertension: Blood pressure seems to be well-controlled.  BPH: Continue finasteride .  Dyslipidemia: Continue current medication.  Chronic atrial fibrillation: Continue metoprolol . Cont librexia  DVT prophylaxis: librexia Family Communication:none Status is: Inpatient Remains inpatient appropriate because: Right lower extremity cellulitis    Code Status:     Code Status Orders  (From admission, onward)           Start     Ordered   07/15/23 2104   Full code  Continuous       Question:  By:  Answer:  Other   07/15/23 2103           Code Status History     Date Active Date Inactive Code Status Order ID Comments User Context   06/25/2023 1546 07/07/2023 1634 Full Code [id number]  Geradine Kitten Inpatient   06/25/2023 1546 06/25/2023 1546 Full Code [id number]  Geradine Kitten Inpatient   06/17/2023 1401 06/25/2023 1540 Full Code [id number]  Lavanda Porter, MD Inpatient   05/17/2023 0911 05/17/2023 1647 Full Code [id number]  Philipp Brawn, MD Inpatient      Advance Directive Documentation    Flowsheet Row Most Recent Value  Type of Advance Directive Healthcare Power of Attorney  Pre-existing out of facility DNR order (yellow form or pink MOST form) --  "MOST" Form in Place? --         IV Access:   Peripheral IV   Procedures and diagnostic studies:   No results found.    Medical Consultants:   None.   Subjective:    Logan French  in a good mood this morning  Objective:    Vitals:   07/23/23 1641 07/23/23 2009 07/23/23 2338 07/24/23 0346  BP: (!) 150/71 132/76 139/88 (!) 145/76  Pulse: 80 91 78 67  Resp: 18 20 18  (!) 21  Temp: 97.6 F (36.4 C) 98.4 F (36.9 C) 98.4 F (36.9 C) 98.1 F (36.7 C)  TempSrc: [place]  SpO2:  98% 97% 97%  Weight:      Height:       SpO2: 97 %   Intake/Output Summary (Last 24 hours) at 07/24/2023 1127 Last data filed at 07/24/2023  1610 Gross per 24 hour  Intake 840 ml  Output 2600 ml  Net -1760 ml   Filed Weights   07/15/23 2125  Weight: 101.6 kg    Exam: General exam: In no acute distress. Respiratory system: Good air movement and clear to auscultation. Cardiovascular system: S1 & S2 heard, RRR. No JVD. Gastrointestinal system: Abdomen is nondistended, soft and nontender.  Psychiatry: Judgement and insight appear normal. Mood & affect appropriate.  Data Reviewed:    Labs: Basic Metabolic Panel: Recent Labs  Lab  07/19/23 0716 07/21/23 0412 07/23/23 0426  NA 134* 136 135  K 3.6 4.1 3.8  CL 101 103 102  CO2 24 26 24   GLUCOSE 114* 117* 122*  BUN 11 12 12   CREATININE 0.76 0.83 0.96  CALCIUM  7.9* 8.2* 8.1*   GFR Estimated Creatinine Clearance: 68.1 mL/min (by C-G formula based on SCr of 0.96 mg/dL). Liver Function Tests: No results for input(s): "AST", "ALT", "ALKPHOS", "BILITOT", "PROT", "ALBUMIN" in the last 168 hours.  No results for input(s): "LIPASE", "AMYLASE" in the last 168 hours. No results for input(s): "AMMONIA" in the last 168 hours. Coagulation profile No results for input(s): "INR", "PROTIME" in the last 168 hours.  COVID-19 Labs  No results for input(s): "DDIMER", "FERRITIN", "LDH", "CRP" in the last 72 hours.  Lab Results  Component Value Date   SARSCOV2NAA Not Detected 03/22/2019    CBC: Recent Labs  Lab 07/19/23 0716 07/21/23 0412 07/23/23 0426  WBC 15.0* 10.5 10.6*  HGB 11.6* 11.7* 12.4*  HCT 35.1* 36.5* 37.6*  MCV 87.1 86.5 85.6  PLT 205 238 318   Cardiac Enzymes: No results for input(s): "CKTOTAL", "CKMB", "CKMBINDEX", "TROPONINI" in the last 168 hours.  BNP (last 3 results) No results for input(s): "PROBNP" in the last 8760 hours. CBG: Recent Labs  Lab 07/23/23 0734 07/23/23 1155 07/23/23 1644 07/23/23 2118 07/24/23 0921  GLUCAP 112* 163* 132* 152* 141*   D-Dimer: No results for input(s): "DDIMER" in the last 72 hours. Hgb A1c: No results for input(s): "HGBA1C" in the last 72 hours. Lipid Profile: No results for input(s): "CHOL", "HDL", "LDLCALC", "TRIG", "CHOLHDL", "LDLDIRECT" in the last 72 hours.  Thyroid  function studies: No results for input(s): "TSH", "T4TOTAL", "T3FREE", "THYROIDAB" in the last 72 hours.  Invalid input(s): "FREET3" Anemia work up: No results for input(s): "VITAMINB12", "FOLATE", "FERRITIN", "TIBC", "IRON", "RETICCTPCT" in the last 72 hours. Sepsis Labs: Recent Labs  Lab 07/19/23 0716 07/21/23 0412  07/23/23 0426  WBC 15.0* 10.5 10.6*   Microbiology Recent Results (from the past 240 hours)  Blood culture (routine x 2)     Status: None   Collection Time: 07/15/23  6:29 PM   Specimen: BLOOD LEFT ARM  Result Value Ref Range Status   Specimen Description   Final    BLOOD LEFT ARM Performed at [facility], [address], [place], [place] [address]    Special Requests   Final    BOTTLES DRAWN AEROBIC AND ANAEROBIC Blood Culture results may not be optimal due to an inadequate volume of blood received in culture bottles Performed at [facility], [address] [place], [place] [address]    Culture   Final    NO GROWTH 5 DAYS Performed at [facility], [address] [place], [place] [address]    Report Status 07/20/2023 FINAL  Final  Blood culture (routine x 2)     Status: None   Collection Time: 07/15/23  7:50 PM  Specimen: BLOOD RIGHT ARM  Result Value Ref Range Status   Specimen Description   Final    BLOOD RIGHT ARM Performed at [facility], [address], [place], [place] [address]    Special Requests   Final    BOTTLES DRAWN AEROBIC AND ANAEROBIC Blood Culture adequate volume Performed at [facility], [address], [place], [place] [address]    Culture   Final    NO GROWTH 5 DAYS Performed at [facility], [address], [place], [place] [address]    Report Status 07/20/2023 FINAL  Final     Medications:    amoxicillin -clavulanate  1 tablet Oral Q12H   clopidogrel   75 mg Oral Q breakfast   doxycycline   100 mg Oral Q12H   ezetimibe   10 mg Oral Daily   finasteride   5 mg Oral Daily   insulin  aspart  0-5 Units Subcutaneous QHS   insulin  aspart  0-9 Units Subcutaneous TID WC   pantoprazole   40 mg Oral Daily   rosuvastatin   20 mg Oral Daily   senna-docusate  2 tablet Oral BID   sodium chloride  flush  10-40 mL Intracatheter Q12H   sodium chloride  flush  3 mL Intravenous Q12H    sodium chloride  flush  3 mL Intravenous Q12H   LIBREXIA-AF apixaban  or placebo  5 mg Oral BID   LIBREXIA-AF [id number] (Milvexian) or placebo  1 tablet Oral BID   Continuous Infusions:      LOS: 9 days   Macdonald Savoy  [facility]  07/24/2023, 11:27 AM

## 2023-07-24 NOTE — Plan of Care (Signed)
  Problem: Clinical Measurements: Goal: Respiratory complications will improve Outcome: Progressing Goal: Cardiovascular complication will be avoided Outcome: Progressing   Problem: Activity: Goal: Risk for activity intolerance will decrease Outcome: Progressing   Problem: Elimination: Goal: Will not experience complications related to urinary retention Outcome: Progressing   Problem: Pain Managment: Goal: General experience of comfort will improve and/or be controlled Outcome: Progressing   Problem: Safety: Goal: Ability to remain free from injury will improve Outcome: Progressing   Problem: Activity: Goal: Ability to return to baseline activity level will improve Outcome: Progressing   Problem: Cardiovascular: Goal: Ability to achieve and maintain adequate cardiovascular perfusion will improve Outcome: Progressing

## 2023-07-25 DIAGNOSIS — A419 Sepsis, unspecified organism: Secondary | ICD-10-CM | POA: Diagnosis not present

## 2023-07-25 LAB — BASIC METABOLIC PANEL WITH GFR
Anion gap: 7 (ref 5–15)
BUN: 11 mg/dL (ref 8–23)
CO2: 26 mmol/L (ref 22–32)
Calcium: 8.2 mg/dL — ABNORMAL LOW (ref 8.9–10.3)
Chloride: 101 mmol/L (ref 98–111)
Creatinine, Ser: 0.8 mg/dL (ref 0.61–1.24)
GFR, Estimated: >60 mL/min (ref >60)
Glucose, Bld: 114 mg/dL — ABNORMAL HIGH (ref 70–99)
Potassium: 4.1 mmol/L (ref 3.5–5.1)
Sodium: 134 mmol/L — ABNORMAL LOW (ref 135–145)

## 2023-07-25 LAB — CBC
HCT: 34.9 % — ABNORMAL LOW (ref 39.0–52.0)
Hemoglobin: 11.2 g/dL — ABNORMAL LOW (ref 13.0–17.0)
MCH: 27.4 pg (ref 26.0–34.0)
MCHC: 32.1 g/dL (ref 30.0–36.0)
MCV: 85.3 fL (ref 80.0–100.0)
Platelets: 389 10*3/uL (ref 150–400)
RBC: 4.09 MIL/uL — ABNORMAL LOW (ref 4.22–5.81)
RDW: 16.6 % — ABNORMAL HIGH (ref 11.5–15.5)
WBC: 11.6 10*3/uL — ABNORMAL HIGH (ref 4.0–10.5)
nRBC: 0 % (ref 0.0–0.2)

## 2023-07-25 LAB — GLUCOSE, CAPILLARY
Glucose-Capillary: 143 mg/dL — ABNORMAL HIGH (ref 70–99)
Glucose-Capillary: 112 mg/dL — ABNORMAL HIGH (ref 70–99)
Glucose-Capillary: 115 mg/dL — ABNORMAL HIGH (ref 70–99)
Glucose-Capillary: 172 mg/dL — ABNORMAL HIGH (ref 70–99)

## 2023-07-25 MED ORDER — AMLODIPINE BESYLATE 2.5 MG PO TABS
2.5000 mg | ORAL_TABLET | Freq: Every day | ORAL | Status: DC
  Administered 2023-07-25 – 2023-07-27 (×3): 2.5 mg via ORAL
  Filled 2023-07-25 (×3): qty 1

## 2023-07-25 MED ORDER — AMLODIPINE BESYLATE 5 MG PO TABS: 5.0000 mg | ORAL_TABLET | Freq: Every day | ORAL | Status: DC

## 2023-07-25 NOTE — Progress Notes (Signed)
 [facility] PROGRESS NOTE    Progress Note  Logan French  ZOX:[id number] DOB: [birth year] DOA: 07/15/2023 PCP: [name], MD     Brief Narrative:   Logan French is an 89 y.o. male past medical history significant for atrial fibrillation status post cardioversion in 2023, hyperlipidemia pernicious anemia, prostate cancer peripheral vascular disease status post angioplasty on March 2025, recently treated for left lower extremity cellulitis for which she completed treatment was discharged to rehab on 07/07/2023 comes back for left foot infection with ulceration and encephalopathic   Assessment/Plan:   Sepsis due to undetermined organism secondary to cellulitis Imaging does not show an abscess. Vascular surgery performed angiogram on 07/20/2023 status post intra-arterial lithotripsy above the left popliteal artery stenosis. Will need Doppler as an outpatient in 4 to 6 weeks. Has remained afebrile no cytosis, continue oral Augmentin  and doxycycline  for 7 additional days. PT evaluated the patient, inpatient rehab was consulted and assessing patient candidacy. Continue daily dressing changes vascular stressed the importance of activity walking and nutrition. Await inpatient rehab bed. Patient is medically stable.  Peripheral arterial disease (HCC): Continue Plavix . He is on the [place] study.  Diabetes mellitus type 2 with hyperglycemia: A1c of 6.4, continue sliding scale insulin .  Essential hypertension: Blood pressures trending up this morning restart home dose of Norvasc.  BPH: Continue finasteride .  Dyslipidemia: Continue current medication.  Chronic atrial fibrillation: Continue metoprolol . Cont librexia  DVT prophylaxis: librexia Family Communication:none Status is: Inpatient Remains inpatient appropriate because: Right lower extremity cellulitis    Code Status:     Code Status Orders  (From admission, onward)           Start      Ordered   07/15/23 2104  Full code  Continuous       Question:  By:  Answer:  Other   07/15/23 2103           Code Status History     Date Active Date Inactive Code Status Order ID Comments User Context   06/25/2023 1546 07/07/2023 1634 Full Code [id number]  Geradine Kitten Inpatient   06/25/2023 1546 06/25/2023 1546 Full Code [id number]  Geradine Kitten Inpatient   06/17/2023 1401 06/25/2023 1540 Full Code [id number]  Lavanda Porter, MD Inpatient   05/17/2023 0911 05/17/2023 1647 Full Code [id number]  Philipp Brawn, MD Inpatient      Advance Directive Documentation    Flowsheet Row Most Recent Value  Type of Advance Directive Healthcare Power of Attorney  Pre-existing out of facility DNR order (yellow form or pink MOST form) --  "MOST" Form in Place? --         IV Access:   Peripheral IV   Procedures and diagnostic studies:   No results found.    Medical Consultants:   None.   Subjective:    Logan French  no complains  Objective:    Vitals:   07/24/23 2003 07/25/23 0008 07/25/23 0601 07/25/23 0745  BP: (!) 135/97 (!) 150/82 (!) 150/84 (!) 166/93  Pulse: 80 75 63 86  Resp: 18 20 19 20   Temp: 98.6 F (37 C) 98.7 F (37.1 C) 98.5 F (36.9 C) (!) 97.3 F (36.3 C)  TempSrc: [place] Axillary  SpO2: 95% 95% 96%   Weight:      Height:       SpO2: 96 %   Intake/Output Summary (Last 24 hours) at 07/25/2023 1114 Last data  filed at 07/25/2023 1038 Gross per 24 hour  Intake 240 ml  Output 1850 ml  Net -1610 ml   Filed Weights   07/15/23 2125  Weight: 101.6 kg    Exam: General exam: In no acute distress. Respiratory system: Good air movement and clear to auscultation. Cardiovascular system: S1 & S2 heard, RRR. No JVD. Gastrointestinal system: Abdomen is nondistended, soft and nontender.  Psychiatry: Judgement and insight appear normal. Mood & affect appropriate.  Data Reviewed:    Labs: Basic Metabolic Panel: Recent  Labs  Lab 07/19/23 0716 07/21/23 0412 07/23/23 0426 07/25/23 0415  NA 134* 136 135 134*  K 3.6 4.1 3.8 4.1  CL 101 103 102 101  CO2 24 26 24 26   GLUCOSE 114* 117* 122* 114*  BUN 11 12 12 11   CREATININE 0.76 0.83 0.96 0.80  CALCIUM  7.9* 8.2* 8.1* 8.2*   GFR Estimated Creatinine Clearance: 81.8 mL/min (by C-G formula based on SCr of 0.8 mg/dL). Liver Function Tests: No results for input(s): "AST", "ALT", "ALKPHOS", "BILITOT", "PROT", "ALBUMIN" in the last 168 hours.  No results for input(s): "LIPASE", "AMYLASE" in the last 168 hours. No results for input(s): "AMMONIA" in the last 168 hours. Coagulation profile No results for input(s): "INR", "PROTIME" in the last 168 hours.  COVID-19 Labs  No results for input(s): "DDIMER", "FERRITIN", "LDH", "CRP" in the last 72 hours.  Lab Results  Component Value Date   SARSCOV2NAA Not Detected 03/22/2019    CBC: Recent Labs  Lab 07/19/23 0716 07/21/23 0412 07/23/23 0426 07/25/23 0415  WBC 15.0* 10.5 10.6* 11.6*  HGB 11.6* 11.7* 12.4* 11.2*  HCT 35.1* 36.5* 37.6* 34.9*  MCV 87.1 86.5 85.6 85.3  PLT 205 238 318 389   Cardiac Enzymes: No results for input(s): "CKTOTAL", "CKMB", "CKMBINDEX", "TROPONINI" in the last 168 hours.  BNP (last 3 results) No results for input(s): "PROBNP" in the last 8760 hours. CBG: Recent Labs  Lab 07/24/23 0921 07/24/23 1139 07/24/23 1709 07/24/23 2047 07/25/23 0744  GLUCAP 141* 124* 130* 161* 143*   D-Dimer: No results for input(s): "DDIMER" in the last 72 hours. Hgb A1c: No results for input(s): "HGBA1C" in the last 72 hours. Lipid Profile: No results for input(s): "CHOL", "HDL", "LDLCALC", "TRIG", "CHOLHDL", "LDLDIRECT" in the last 72 hours.  Thyroid  function studies: No results for input(s): "TSH", "T4TOTAL", "T3FREE", "THYROIDAB" in the last 72 hours.  Invalid input(s): "FREET3" Anemia work up: No results for input(s): "VITAMINB12", "FOLATE", "FERRITIN", "TIBC", "IRON",  "RETICCTPCT" in the last 72 hours. Sepsis Labs: Recent Labs  Lab 07/19/23 0716 07/21/23 0412 07/23/23 0426 07/25/23 0415  WBC 15.0* 10.5 10.6* 11.6*   Microbiology Recent Results (from the past 240 hours)  Blood culture (routine x 2)     Status: None   Collection Time: 07/15/23  6:29 PM   Specimen: BLOOD LEFT ARM  Result Value Ref Range Status   Specimen Description   Final    BLOOD LEFT ARM Performed at [facility], [address] [place], [place] [address]    Special Requests   Final    BOTTLES DRAWN AEROBIC AND ANAEROBIC Blood Culture results may not be optimal due to an inadequate volume of blood received in culture bottles Performed at [facility], [address], [place], [place] [address]    Culture   Final    NO GROWTH 5 DAYS Performed at [facility], [address], [place], [place] [address]    Report Status 07/20/2023  FINAL  Final  Blood culture (routine x 2)     Status: None   Collection Time: 07/15/23  7:50 PM   Specimen: BLOOD RIGHT ARM  Result Value Ref Range Status   Specimen Description   Final    BLOOD RIGHT ARM Performed at [facility], [address] [place], [place] [address]    Special Requests   Final    BOTTLES DRAWN AEROBIC AND ANAEROBIC Blood Culture adequate volume Performed at [facility], [address], [place], [place] [address]    Culture   Final    NO GROWTH 5 DAYS Performed at [facility], [address], [place], [place] [address]    Report Status 07/20/2023 FINAL  Final     Medications:    amoxicillin -clavulanate  1 tablet [place] Q12H   clopidogrel   75 mg Oral Q breakfast   doxycycline   100 mg Oral Q12H   ezetimibe   10 mg Oral Daily   finasteride   5 mg Oral Daily   insulin  aspart  0-5 Units Subcutaneous QHS   insulin  aspart  0-9 Units Subcutaneous TID WC   pantoprazole   40 mg Oral Daily   rosuvastatin   20 mg Oral Daily   senna-docusate  2  tablet Oral BID   sodium chloride  flush  10-40 mL Intracatheter Q12H   LIBREXIA-AF apixaban  or placebo  5 mg Oral BID   LIBREXIA-AF [id number] (Milvexian) or placebo  1 tablet Oral BID   Continuous Infusions:      LOS: 10 days   Logan French  [facility]  07/25/2023, 11:14 AM

## 2023-07-25 NOTE — Plan of Care (Signed)

## 2023-07-26 DIAGNOSIS — A419 Sepsis, unspecified organism: Secondary | ICD-10-CM | POA: Diagnosis not present

## 2023-07-26 LAB — GLUCOSE, CAPILLARY
Glucose-Capillary: 145 mg/dL — ABNORMAL HIGH (ref 70–99)
Glucose-Capillary: 116 mg/dL — ABNORMAL HIGH (ref 70–99)
Glucose-Capillary: 152 mg/dL — ABNORMAL HIGH (ref 70–99)
Glucose-Capillary: 121 mg/dL — ABNORMAL HIGH (ref 70–99)

## 2023-07-26 NOTE — Progress Notes (Signed)
 Occupational Therapy Treatment Patient Details Name: Logan French MRN: [id number] DOB: 09-12-1934 Today's Date: 07/26/2023   History of present illness Pt is an 89 y/o male admitted He presents to our facility with worsening left foot infection with ulceration, disorientatio, prior admission in March 2025 for RLE angioplasty, April for for RLE cellulitis and nonhealing wound. +for bacetermia. PMH: prostate cancer, anemia, HTN, HLD, a fib, b12 deficiency, PAD s/p vascular intervention.   OT comments  Pt making good progress with functional goals. Pt able to sit EOB min A, sit-stand mod A form EOB and commode, Session focused on functional mobility using RW, standing at sink for hygiene tasks, UB/LB ADLs and activity tolerance. Pt pleasant, cooperative and very motivated to increase Ind with ADLs and ADL mobility. Pt will benefit from intensive inpatient follow-up therapy, >3 hours/day to help restore PLOF to return home safely OT will continue to follow acutely to maximize level of function and safety      If plan is discharge home, recommend the following:  A lot of help with walking and/or transfers;A lot of help with bathing/dressing/bathroom;Assistance with cooking/housework;Assist for transportation;Help with stairs or ramp for entrance   Equipment Recommendations  Other (comment) (defer)    Recommendations for Other Services      Precautions / Restrictions Precautions Precautions: Fall;Other (comment) Recall of Precautions/Restrictions: Intact Precaution/Restrictions Comments: bilat foot wounds Restrictions Weight Bearing Restrictions Per Provider Order: No       Mobility Bed Mobility Overal bed mobility: Needs Assistance Bed Mobility: Supine to Sit, Sit to Supine     Supine to sit: Contact guard, Used rails, HOB elevated Sit to supine: Contact guard assist, Used rails   General bed mobility comments: increased time and effort    Transfers Overall transfer level:  Needs assistance Equipment used: Rolling walker (2 wheels) Transfers: Sit to/from Stand Sit to Stand: From elevated surface, Mod assist           General transfer comment: increased time and multi attempts to power up from elevated bed and 3 in 1 over toilet     Balance Overall balance assessment: Needs assistance Sitting-balance support: No upper extremity supported, Feet supported Sitting balance-Leahy Scale: Fair     Standing balance support: Bilateral upper extremity supported, During functional activity, Reliant on assistive device for balance Standing balance-Leahy Scale: Poor                             ADL either performed or assessed with clinical judgement   ADL Overall ADL's : Needs assistance/impaired     Grooming: Wash/dry hands;Wash/dry face;Contact guard assist;Standing       Lower Body Bathing: Moderate assistance;Sitting/lateral leans       Lower Body Dressing: Total assistance Lower Body Dressing Details (indicate cue type and reason): assisted for socks Toilet Transfer: Moderate assistance;Ambulation;Rolling walker (2 wheels);Regular Toilet;BSC/3in1;Grab bars   Toileting- Clothing Manipulation and Hygiene: Maximal assistance;Sit to/from stand       Functional mobility during ADLs: Moderate assistance;Rolling walker (2 wheels)      Extremity/Trunk Assessment Upper Extremity Assessment Upper Extremity Assessment: Generalized weakness   Lower Extremity Assessment Lower Extremity Assessment: Defer to PT evaluation   Cervical / Trunk Assessment Cervical / Trunk Assessment: Normal    Vision Baseline Vision/History: 1 Wears glasses Ability to See in Adequate Light: 0 Adequate Patient Visual Report: No change from baseline     Perception     Praxis  Communication Communication Communication: No apparent difficulties   Cognition Arousal: Alert Behavior During Therapy: WFL for tasks assessed/performed Cognition: No apparent  impairments                               Following commands: Intact Following commands impaired: Follows one step commands with increased time      Cueing   Cueing Techniques: Verbal cues  Exercises      Shoulder Instructions       General Comments VSS on RA    Pertinent Vitals/ Pain       Pain Assessment Pain Assessment: No/denies pain Faces Pain Scale: Hurts little more Pain Location: LLE with WB Pain Descriptors / Indicators: Discomfort, Grimacing, Guarding Pain Intervention(s): Monitored during session, Premedicated before session, Repositioned, Limited activity within patient's tolerance  Home Living                                          Prior Functioning/Environment              Frequency  Min 2X/week        Progress Toward Goals  OT Goals(current goals can now be found in the care plan section)  Progress towards OT goals: Progressing toward goals     Plan      Co-evaluation                 AM-PAC OT "6 Clicks" Daily Activity     Outcome Measure   Help from another person eating meals?: None Help from another person taking care of personal grooming?: A Little Help from another person toileting, which includes using toliet, bedpan, or urinal?: A Lot Help from another person bathing (including washing, rinsing, drying)?: A Lot Help from another person to put on and taking off regular upper body clothing?: A Little Help from another person to put on and taking off regular lower body clothing?: Total 6 Click Score: 15    End of Session Equipment Utilized During Treatment: Gait belt;Rolling walker (2 wheels);Other (comment) (3 in 1 over toilet)  OT Visit Diagnosis: Unsteadiness on feet (R26.81);Other abnormalities of gait and mobility (R26.89);Muscle weakness (generalized) (M62.81);Pain Pain - Right/Left: Left Pain - part of body: Ankle and joints of foot;Leg   Activity Tolerance Patient tolerated  treatment well   Patient Left with call bell/phone within reach;in bed   Nurse Communication Mobility status        Time: 1610-9604 OT Time Calculation (min): 21 min  Charges: OT General Charges $OT Visit: 1 Visit OT Treatments $Self Care/Home Management : 8-22 mins   Fonda Hymen [facility] 07/26/2023, 3:05 PM

## 2023-07-26 NOTE — Plan of Care (Signed)
  Problem: Coping: Goal: Ability to adjust to condition or change in health will improve Outcome: Progressing   Problem: Health Behavior/Discharge Planning: Goal: Ability to manage health-related needs will improve Outcome: Progressing   Problem: Metabolic: Goal: Ability to maintain appropriate glucose levels will improve Outcome: Progressing   Problem: Nutritional: Goal: Maintenance of adequate nutrition will improve Outcome: Progressing   Problem: Clinical Measurements: Goal: Respiratory complications will improve Outcome: Progressing Goal: Cardiovascular complication will be avoided Outcome: Progressing   Problem: Nutrition: Goal: Adequate nutrition will be maintained Outcome: Progressing   Problem: Pain Managment: Goal: General experience of comfort will improve and/or be controlled Outcome: Progressing   Problem: Safety: Goal: Ability to remain free from injury will improve Outcome: Progressing   Problem: Cardiovascular: Goal: Ability to achieve and maintain adequate cardiovascular perfusion will improve Outcome: Progressing

## 2023-07-26 NOTE — Progress Notes (Signed)
 Inpatient Rehab Admissions Coordinator:   We continue to wait for bed availability on CIR. Will follow patient for potential admission this week when bed becomes available. Patient and son notified.      Rehab Admissons Coordinator Tyri Elmore, [place], [place] [phone]

## 2023-07-26 NOTE — Progress Notes (Signed)
 [facility] PROGRESS NOTE    Progress Note  Logan French  NWG:[id number] DOB: Feb 12, 1935 DOA: 07/15/2023 PCP: [name], MD     Brief Narrative:   Logan French is an 89 y.o. male past medical history significant for atrial fibrillation status post cardioversion in 2023, hyperlipidemia pernicious anemia, prostate cancer peripheral vascular disease status post angioplasty on March 2025, recently treated for left lower extremity cellulitis for which she completed treatment was discharged to rehab on 07/07/2023 comes back for left foot infection with ulceration and encephalopathic   Assessment/Plan:   Sepsis due to undetermined organism secondary to cellulitis Imaging does not show an abscess. Vascular surgery performed angiogram on 07/20/2023 status post intra-arterial lithotripsy above the left popliteal artery stenosis. Will need Doppler as an outpatient in 4 to 6 weeks. Has remained afebrile no cytosis, continue oral Augmentin  and doxycycline  for 7 additional days. PT evaluated the patient, inpatient rehab was consulted and assessing patient candidacy. Continue daily dressing changes vascular stressed the importance of activity walking and nutrition. Await inpatient rehab bed. Patient is medically stable.  Peripheral arterial disease (HCC): Continue Plavix . He is on the [place] study.  Diabetes mellitus type 2 with hyperglycemia: A1c of 6.4, continue sliding scale insulin .  Essential hypertension: Blood pressures trending up this morning restart home dose of Norvasc.  BPH: Continue finasteride .  Dyslipidemia: Continue current medication.  Chronic atrial fibrillation: Continue metoprolol . Cont librexia  DVT prophylaxis: librexia Family Communication:none Status is: Inpatient Remains inpatient appropriate because: Right lower extremity cellulitis    Code Status:     Code Status Orders  (From admission, onward)           Start      Ordered   07/15/23 2104  Full code  Continuous       Question:  By:  Answer:  Other   07/15/23 2103           Code Status History     Date Active Date Inactive Code Status Order ID Comments User Context   06/25/2023 1546 07/07/2023 1634 Full Code [id number]  Geradine Kitten Inpatient   06/25/2023 1546 06/25/2023 1546 Full Code [id number]  Geradine Kitten Inpatient   06/17/2023 1401 06/25/2023 1540 Full Code [id number]  Lavanda Porter, MD Inpatient   05/17/2023 0911 05/17/2023 1647 Full Code [id number]  Philipp Brawn, MD Inpatient      Advance Directive Documentation    Flowsheet Row Most Recent Value  Type of Advance Directive Healthcare Power of Attorney  Pre-existing out of facility DNR order (yellow form or pink MOST form) --  "MOST" Form in Place? --         IV Access:   Peripheral IV   Procedures and diagnostic studies:   No results found.    Medical Consultants:   None.   Subjective:    Logan French no complaints today.  Objective:    Vitals:   07/25/23 2041 07/25/23 2347 07/26/23 0420 07/26/23 0750  BP: 134/81 (!) 144/84 (!) 162/82 (!) 145/95  Pulse: 73 71 69 69  Resp: 18 16 16 18   Temp: 97.6 F (36.4 C) (!) 97.3 F (36.3 C) 98.3 F (36.8 C) 98.4 F (36.9 C)  TempSrc: Oral Oral Oral Oral  SpO2: 97% 97% 98% 94%  Weight:      Height:       SpO2: 94 %   Intake/Output Summary (Last 24 hours) at 07/26/2023 1015 Last data filed  at 07/26/2023 0849 Gross per 24 hour  Intake 440 ml  Output 3300 ml  Net -2860 ml   Filed Weights   07/15/23 2125  Weight: 101.6 kg    Exam: General exam: In no acute distress. Respiratory system: Good air movement and clear to auscultation. Cardiovascular system: S1 & S2 heard, RRR. No JVD. Gastrointestinal system: Abdomen is nondistended, soft and nontender.  Psychiatry: Judgement and insight appear normal. Mood & affect appropriate.  Data Reviewed:    Labs: Basic Metabolic  Panel: Recent Labs  Lab 07/21/23 0412 07/23/23 0426 07/25/23 0415  NA 136 135 134*  K 4.1 3.8 4.1  CL 103 102 101  CO2 26 24 26   GLUCOSE 117* 122* 114*  BUN 12 12 11   CREATININE 0.83 0.96 0.80  CALCIUM  8.2* 8.1* 8.2*   GFR Estimated Creatinine Clearance: 81.8 mL/min (by C-G formula based on SCr of 0.8 mg/dL). Liver Function Tests: No results for input(s): "AST", "ALT", "ALKPHOS", "BILITOT", "PROT", "ALBUMIN" in the last 168 hours.  No results for input(s): "LIPASE", "AMYLASE" in the last 168 hours. No results for input(s): "AMMONIA" in the last 168 hours. Coagulation profile No results for input(s): "INR", "PROTIME" in the last 168 hours.  COVID-19 Labs  No results for input(s): "DDIMER", "FERRITIN", "LDH", "CRP" in the last 72 hours.  Lab Results  Component Value Date   SARSCOV2NAA Not Detected 03/22/2019    CBC: Recent Labs  Lab 07/21/23 0412 07/23/23 0426 07/25/23 0415  WBC 10.5 10.6* 11.6*  HGB 11.7* 12.4* 11.2*  HCT 36.5* 37.6* 34.9*  MCV 86.5 85.6 85.3  PLT 238 318 389   Cardiac Enzymes: No results for input(s): "CKTOTAL", "CKMB", "CKMBINDEX", "TROPONINI" in the last 168 hours.  BNP (last 3 results) No results for input(s): "PROBNP" in the last 8760 hours. CBG: Recent Labs  Lab 07/25/23 0744 07/25/23 1220 07/25/23 1716 07/25/23 2111 07/26/23 0807  GLUCAP 143* 112* 115* 172* 145*   D-Dimer: No results for input(s): "DDIMER" in the last 72 hours. Hgb A1c: No results for input(s): "HGBA1C" in the last 72 hours. Lipid Profile: No results for input(s): "CHOL", "HDL", "LDLCALC", "TRIG", "CHOLHDL", "LDLDIRECT" in the last 72 hours.  Thyroid  function studies: No results for input(s): "TSH", "T4TOTAL", "T3FREE", "THYROIDAB" in the last 72 hours.  Invalid input(s): "FREET3" Anemia work up: No results for input(s): "VITAMINB12", "FOLATE", "FERRITIN", "TIBC", "IRON", "RETICCTPCT" in the last 72 hours. Sepsis Labs: Recent Labs  Lab 07/21/23 0412  07/23/23 0426 07/25/23 0415  WBC 10.5 10.6* 11.6*   Microbiology No results found for this or any previous visit (from the past 240 hours).    Medications:    amLODipine  2.5 mg Oral Daily   amoxicillin -clavulanate  1 tablet Oral Q12H   clopidogrel   75 mg Oral Q breakfast   doxycycline   100 mg Oral Q12H   ezetimibe   10 mg Oral Daily   finasteride   5 mg Oral Daily   insulin  aspart  0-5 Units Subcutaneous QHS   insulin  aspart  0-9 Units Subcutaneous TID WC   pantoprazole   40 mg Oral Daily   rosuvastatin   20 mg Oral Daily   senna-docusate  2 tablet Oral BID   sodium chloride  flush  10-40 mL Intracatheter Q12H   LIBREXIA-AF apixaban  or placebo  5 mg Oral BID   LIBREXIA-AF [id number] (Milvexian) or placebo  1 tablet Oral BID   Continuous Infusions:      LOS: 11 days   Macdonald Savoy  [facility]  07/26/2023,  10:15 AM

## 2023-07-26 NOTE — Progress Notes (Signed)
 Physical Therapy Treatment Patient Details Name: Logan French MRN: [id number] DOB: 06-21-1934 Today's Date: 07/26/2023   History of Present Illness Pt is an 89 y/o male admitted He presents to our facility with worsening left foot infection with ulceration, disorientatio, prior admission in March 2025 for RLE angioplasty, April for for RLE cellulitis and nonhealing wound. +for bacetermia. PMH: prostate cancer, anemia, HTN, HLD, a fib, b12 deficiency, PAD s/p vascular intervention.    PT Comments  Pt required CGA bed mobility, mod assist transfers, and min assist amb 25' x 2 with RW. Seated rest break between gait trials. VSS on RA. Pt returned to bed at end of session. Current POC remains appropriate.     If plan is discharge home, recommend the following: Assistance with cooking/housework;Assist for transportation;Help with stairs or ramp for entrance;A lot of help with bathing/dressing/bathroom;A lot of help with walking and/or transfers   Can travel by private vehicle        Equipment Recommendations  BSC/3in1;Wheelchair (measurements PT);Wheelchair cushion (measurements PT)    Recommendations for Other Services       Precautions / Restrictions Precautions Precautions: Fall;Other (comment) Recall of Precautions/Restrictions: Intact Precaution/Restrictions Comments: bilat foot wounds Restrictions Weight Bearing Restrictions Per Provider Order: No     Mobility  Bed Mobility Overal bed mobility: Needs Assistance Bed Mobility: Supine to Sit, Sit to Supine     Supine to sit: Contact guard, Used rails, HOB elevated Sit to supine: Contact guard assist, Used rails   General bed mobility comments: increased time and effort    Transfers Overall transfer level: Needs assistance Equipment used: Rolling walker (2 wheels) Transfers: Sit to/from Stand Sit to Stand: From elevated surface, Mod assist           General transfer comment: increased time and multi attempts to  power up from elevated bed    Ambulation/Gait Ambulation/Gait assistance: Min assist Gait Distance (Feet): 25 Feet (x 2) Assistive device: Rolling walker (2 wheels) Gait Pattern/deviations: Trunk flexed, Antalgic, Decreased stride length, Step-to pattern Gait velocity: decreased Gait velocity interpretation: <1.31 ft/sec, indicative of household ambulator   General Gait Details: leading with LLE, cues to look up, seated rest break between gait trials   Stairs             Wheelchair Mobility     Tilt Bed    Modified Rankin (Stroke Patients Only)       Balance Overall balance assessment: Needs assistance Sitting-balance support: No upper extremity supported, Feet supported Sitting balance-Leahy Scale: Fair     Standing balance support: Bilateral upper extremity supported, During functional activity, Reliant on assistive device for balance Standing balance-Leahy Scale: Poor                              Communication Communication Communication: No apparent difficulties  Cognition Arousal: Alert Behavior During Therapy: WFL for tasks assessed/performed   PT - Cognitive impairments: No apparent impairments                       PT - Cognition Comments: very verbose, tangential Following commands: Intact      Cueing    Exercises      General Comments General comments (skin integrity, edema, etc.): VSS on RA      Pertinent Vitals/Pain Pain Assessment Pain Score: 5  Pain Location: LLE with WB Pain Descriptors / Indicators: Discomfort, Grimacing, Guarding Pain Intervention(s): Limited activity  within patient's tolerance, Monitored during session, Patient requesting pain meds-RN notified    Home Living                          Prior Function            PT Goals (current goals can now be found in the care plan section) Acute Rehab PT Goals Patient Stated Goal: independence Progress towards PT goals: Progressing toward  goals    Frequency    Min 2X/week      PT Plan      Co-evaluation              AM-PAC PT "6 Clicks" Mobility   Outcome Measure  Help needed turning from your back to your side while in a flat bed without using bedrails?: A Little Help needed moving from lying on your back to sitting on the side of a flat bed without using bedrails?: A Little Help needed moving to and from a bed to a chair (including a wheelchair)?: A Lot Help needed standing up from a chair using your arms (e.g., wheelchair or bedside chair)?: A Lot Help needed to walk in hospital room?: A Little Help needed climbing 3-5 steps with a railing? : Total 6 Click Score: 14    End of Session Equipment Utilized During Treatment: Gait belt Activity Tolerance: Patient tolerated treatment well Patient left: in bed;with call bell/phone within reach Nurse Communication: Mobility status;Patient requests pain meds PT Visit Diagnosis: Unsteadiness on feet (R26.81);Muscle weakness (generalized) (M62.81);Difficulty in walking, not elsewhere classified (R26.2);Pain;Other abnormalities of gait and mobility (R26.89) Pain - Right/Left: Left Pain - part of body: Leg     Time: 1308-6578 PT Time Calculation (min) (ACUTE ONLY): 29 min  Charges:    $Gait Training: 23-37 mins PT General Charges $$ ACUTE PT VISIT: 1 Visit                     Dorothye Gathers., PT  Office # [phone]    Guadelupe Leech 07/26/2023, 12:06 PM

## 2023-07-27 ENCOUNTER — Encounter (HOSPITAL_COMMUNITY): Payer: Self-pay | Admitting: Physical Medicine and Rehabilitation

## 2023-07-27 ENCOUNTER — Inpatient Hospital Stay (HOSPITAL_COMMUNITY)
Admission: AD | Admit: 2023-07-27 | Discharge: 2023-08-10 | DRG: 945 | Disposition: A | Discharge status: Home Health | Source: Intra-hospital | Attending: Physical Medicine and Rehabilitation | Admitting: Physical Medicine and Rehabilitation

## 2023-07-27 ENCOUNTER — Other Ambulatory Visit: Payer: Self-pay

## 2023-07-27 DIAGNOSIS — L0390 Cellulitis, unspecified: Principal | ICD-10-CM | POA: Diagnosis present

## 2023-07-27 DIAGNOSIS — I517 Cardiomegaly: Secondary | ICD-10-CM | POA: Diagnosis not present

## 2023-07-27 DIAGNOSIS — Z860100 Personal history of colon polyps, unspecified: Secondary | ICD-10-CM

## 2023-07-27 DIAGNOSIS — Z7409 Other reduced mobility: Secondary | ICD-10-CM | POA: Diagnosis present

## 2023-07-27 DIAGNOSIS — Z7902 Long term (current) use of antithrombotics/antiplatelets: Secondary | ICD-10-CM | POA: Diagnosis not present

## 2023-07-27 DIAGNOSIS — R7989 Other specified abnormal findings of blood chemistry: Secondary | ICD-10-CM | POA: Diagnosis not present

## 2023-07-27 DIAGNOSIS — R0682 Tachypnea, not elsewhere classified: Secondary | ICD-10-CM | POA: Diagnosis not present

## 2023-07-27 DIAGNOSIS — R1032 Left lower quadrant pain: Secondary | ICD-10-CM | POA: Diagnosis not present

## 2023-07-27 DIAGNOSIS — E1151 Type 2 diabetes mellitus with diabetic peripheral angiopathy without gangrene: Secondary | ICD-10-CM | POA: Diagnosis present

## 2023-07-27 DIAGNOSIS — Z794 Long term (current) use of insulin: Secondary | ICD-10-CM | POA: Diagnosis not present

## 2023-07-27 DIAGNOSIS — Z4800 Encounter for change or removal of nonsurgical wound dressing: Secondary | ICD-10-CM

## 2023-07-27 DIAGNOSIS — I70202 Unspecified atherosclerosis of native arteries of extremities, left leg: Secondary | ICD-10-CM | POA: Diagnosis present

## 2023-07-27 DIAGNOSIS — N179 Acute kidney failure, unspecified: Secondary | ICD-10-CM | POA: Diagnosis not present

## 2023-07-27 DIAGNOSIS — M1712 Unilateral primary osteoarthritis, left knee: Secondary | ICD-10-CM | POA: Diagnosis present

## 2023-07-27 DIAGNOSIS — M25562 Pain in left knee: Secondary | ICD-10-CM | POA: Diagnosis not present

## 2023-07-27 DIAGNOSIS — Z8546 Personal history of malignant neoplasm of prostate: Secondary | ICD-10-CM | POA: Diagnosis not present

## 2023-07-27 DIAGNOSIS — I700 Atherosclerosis of aorta: Secondary | ICD-10-CM | POA: Diagnosis not present

## 2023-07-27 DIAGNOSIS — L97529 Non-pressure chronic ulcer of other part of left foot with unspecified severity: Secondary | ICD-10-CM | POA: Diagnosis present

## 2023-07-27 DIAGNOSIS — D72829 Elevated white blood cell count, unspecified: Secondary | ICD-10-CM | POA: Diagnosis not present

## 2023-07-27 DIAGNOSIS — E785 Hyperlipidemia, unspecified: Secondary | ICD-10-CM | POA: Diagnosis present

## 2023-07-27 DIAGNOSIS — E119 Type 2 diabetes mellitus without complications: Secondary | ICD-10-CM | POA: Diagnosis not present

## 2023-07-27 DIAGNOSIS — L03116 Cellulitis of left lower limb: Secondary | ICD-10-CM | POA: Diagnosis not present

## 2023-07-27 DIAGNOSIS — Z7901 Long term (current) use of anticoagulants: Secondary | ICD-10-CM

## 2023-07-27 DIAGNOSIS — Z79899 Other long term (current) drug therapy: Secondary | ICD-10-CM

## 2023-07-27 DIAGNOSIS — R5381 Other malaise: Principal | ICD-10-CM | POA: Diagnosis present

## 2023-07-27 DIAGNOSIS — D510 Vitamin B12 deficiency anemia due to intrinsic factor deficiency: Secondary | ICD-10-CM | POA: Diagnosis present

## 2023-07-27 DIAGNOSIS — I739 Peripheral vascular disease, unspecified: Secondary | ICD-10-CM | POA: Diagnosis not present

## 2023-07-27 DIAGNOSIS — M109 Gout, unspecified: Secondary | ICD-10-CM | POA: Diagnosis present

## 2023-07-27 DIAGNOSIS — I10 Essential (primary) hypertension: Secondary | ICD-10-CM | POA: Diagnosis present

## 2023-07-27 DIAGNOSIS — Z7984 Long term (current) use of oral hypoglycemic drugs: Secondary | ICD-10-CM

## 2023-07-27 DIAGNOSIS — M160 Bilateral primary osteoarthritis of hip: Secondary | ICD-10-CM | POA: Diagnosis not present

## 2023-07-27 DIAGNOSIS — R5383 Other fatigue: Secondary | ICD-10-CM | POA: Diagnosis present

## 2023-07-27 DIAGNOSIS — I4891 Unspecified atrial fibrillation: Secondary | ICD-10-CM | POA: Diagnosis present

## 2023-07-27 DIAGNOSIS — E871 Hypo-osmolality and hyponatremia: Secondary | ICD-10-CM | POA: Diagnosis present

## 2023-07-27 DIAGNOSIS — L03115 Cellulitis of right lower limb: Secondary | ICD-10-CM | POA: Diagnosis present

## 2023-07-27 DIAGNOSIS — M25552 Pain in left hip: Secondary | ICD-10-CM | POA: Diagnosis not present

## 2023-07-27 DIAGNOSIS — S90822A Blister (nonthermal), left foot, initial encounter: Secondary | ICD-10-CM | POA: Diagnosis present

## 2023-07-27 DIAGNOSIS — Z9862 Peripheral vascular angioplasty status: Secondary | ICD-10-CM

## 2023-07-27 DIAGNOSIS — A419 Sepsis, unspecified organism: Secondary | ICD-10-CM | POA: Diagnosis not present

## 2023-07-27 LAB — GLUCOSE, CAPILLARY
Glucose-Capillary: 136 mg/dL — ABNORMAL HIGH (ref 70–99)
Glucose-Capillary: 123 mg/dL — ABNORMAL HIGH (ref 70–99)
Glucose-Capillary: 167 mg/dL — ABNORMAL HIGH (ref 70–99)
Glucose-Capillary: 157 mg/dL — ABNORMAL HIGH (ref 70–99)

## 2023-07-27 MED ORDER — AMOXICILLIN-POT CLAVULANATE 875-125 MG PO TABS
1.0000 | ORAL_TABLET | Freq: Two times a day (BID) | ORAL | Status: AC
  Administered 2023-07-27 – 2023-07-31 (×8): 1 via ORAL
  Filled 2023-07-27 (×8): qty 1

## 2023-07-27 MED ORDER — ROSUVASTATIN CALCIUM 20 MG PO TABS
20.0000 mg | ORAL_TABLET | Freq: Every day | ORAL | Status: DC
  Administered 2023-07-28 – 2023-08-10 (×14): 20 mg via ORAL
  Filled 2023-07-27 (×14): qty 1

## 2023-07-27 MED ORDER — AMLODIPINE BESYLATE 2.5 MG PO TABS
2.5000 mg | ORAL_TABLET | Freq: Every day | ORAL | Status: DC
  Administered 2023-07-28 – 2023-08-05 (×9): 2.5 mg via ORAL
  Filled 2023-07-27 (×9): qty 1

## 2023-07-27 MED ORDER — INSULIN ASPART 100 UNIT/ML IJ SOLN
0.0000 [IU] | Freq: Three times a day (TID) | INTRAMUSCULAR | Status: DC
  Administered 2023-07-27 – 2023-07-28 (×2): 1 [IU] via SUBCUTANEOUS
  Administered 2023-07-29: 2 [IU] via SUBCUTANEOUS

## 2023-07-27 MED ORDER — PANTOPRAZOLE SODIUM 40 MG PO TBEC
40.0000 mg | DELAYED_RELEASE_TABLET | Freq: Every day | ORAL | Status: DC
  Administered 2023-07-28 – 2023-08-10 (×14): 40 mg via ORAL
  Filled 2023-07-27 (×14): qty 1

## 2023-07-27 MED ORDER — HYDROXYZINE HCL 10 MG PO TABS
10.0000 mg | ORAL_TABLET | Freq: Three times a day (TID) | ORAL | Status: DC | PRN
  Administered 2023-07-28: 10 mg via ORAL
  Filled 2023-07-27 (×2): qty 1

## 2023-07-27 MED ORDER — CAMPHOR-MENTHOL 0.5-0.5 % EX LOTN
TOPICAL_LOTION | CUTANEOUS | Status: DC | PRN
  Administered 2023-07-28 – 2023-08-05 (×2): 1 via TOPICAL
  Filled 2023-07-27 (×2): qty 222

## 2023-07-27 MED ORDER — CLOPIDOGREL BISULFATE 75 MG PO TABS
75.0000 mg | ORAL_TABLET | Freq: Every day | ORAL | Status: DC
  Administered 2023-07-28 – 2023-08-10 (×14): 75 mg via ORAL
  Filled 2023-07-27 (×14): qty 1

## 2023-07-27 MED ORDER — METOPROLOL SUCCINATE ER 25 MG PO TB24
12.5000 mg | ORAL_TABLET | Freq: Two times a day (BID) | ORAL | Status: DC
  Administered 2023-07-27 – 2023-08-10 (×28): 12.5 mg via ORAL
  Filled 2023-07-27 (×28): qty 1

## 2023-07-27 MED ORDER — METOPROLOL SUCCINATE ER 25 MG PO TB24
12.5000 mg | ORAL_TABLET | Freq: Two times a day (BID) | ORAL | Status: DC
Start: 2023-07-27 — End: 2023-07-27
  Administered 2023-07-27: 12.5 mg via ORAL
  Filled 2023-07-27: qty 1

## 2023-07-27 MED ORDER — MEDIHONEY WOUND/BURN DRESSING EX PSTE
1.0000 | PASTE | Freq: Every day | CUTANEOUS | Status: DC
  Administered 2023-07-27 – 2023-08-04 (×9): 1 via TOPICAL
  Filled 2023-07-27: qty 44

## 2023-07-27 MED ORDER — HYDROCERIN EX CREA
TOPICAL_CREAM | Freq: Two times a day (BID) | CUTANEOUS | Status: DC
  Filled 2023-07-27 (×2): qty 113

## 2023-07-27 MED ORDER — DOXYCYCLINE HYCLATE 100 MG PO TABS
100.0000 mg | ORAL_TABLET | Freq: Two times a day (BID) | ORAL | Status: AC
  Administered 2023-07-27 – 2023-07-31 (×8): 100 mg via ORAL
  Filled 2023-07-27 (×8): qty 1

## 2023-07-27 MED ORDER — SENNOSIDES-DOCUSATE SODIUM 8.6-50 MG PO TABS
2.0000 | ORAL_TABLET | Freq: Two times a day (BID) | ORAL | Status: DC
  Administered 2023-07-27 – 2023-08-10 (×28): 2 via ORAL
  Filled 2023-07-27 (×29): qty 2

## 2023-07-27 MED ORDER — DOXYCYCLINE HYCLATE 100 MG PO TABS: 100.0000 mg | ORAL_TABLET | Freq: Two times a day (BID) | ORAL | Status: DC

## 2023-07-27 MED ORDER — STUDY - LIBREXIA-AF - JNJ-70033093 (MILVEXIAN) 100 MG OR PLACEBO (PI-SETHI)
1.0000 | ORAL_TABLET | Freq: Two times a day (BID) | ORAL | Status: DC
  Administered 2023-07-27 – 2023-08-10 (×28): 1 via ORAL
  Filled 2023-07-27 (×28): qty 1

## 2023-07-27 MED ORDER — ACETAMINOPHEN 325 MG PO TABS: 650.0000 mg | ORAL_TABLET | ORAL | Status: DC | PRN

## 2023-07-27 MED ORDER — OXYCODONE HCL 5 MG PO TABS
5.0000 mg | ORAL_TABLET | Freq: Four times a day (QID) | ORAL | Status: DC | PRN
  Administered 2023-07-28 – 2023-08-07 (×13): 5 mg via ORAL
  Filled 2023-07-27 (×19): qty 1

## 2023-07-27 MED ORDER — STUDY - LIBREXIA-AF - APIXABAN 5 MG OR PLACEBO CAPSULE (PI-SETHI)
5.0000 mg | ORAL_CAPSULE | Freq: Two times a day (BID) | ORAL | Status: DC
  Administered 2023-07-27 – 2023-08-10 (×28): 5 mg via ORAL
  Filled 2023-07-27 (×29): qty 1

## 2023-07-27 MED ORDER — FINASTERIDE 5 MG PO TABS
5.0000 mg | ORAL_TABLET | Freq: Every day | ORAL | Status: DC
  Administered 2023-07-28 – 2023-08-06 (×10): 5 mg via ORAL
  Filled 2023-07-27 (×10): qty 1

## 2023-07-27 MED ORDER — EZETIMIBE 10 MG PO TABS
10.0000 mg | ORAL_TABLET | Freq: Every day | ORAL | Status: DC
  Administered 2023-07-28 – 2023-08-10 (×14): 10 mg via ORAL
  Filled 2023-07-27 (×14): qty 1

## 2023-07-27 MED ORDER — AMOXICILLIN-POT CLAVULANATE 875-125 MG PO TABS: 1.0000 | ORAL_TABLET | Freq: Two times a day (BID) | ORAL | Status: DC

## 2023-07-27 MED ORDER — POLYETHYLENE GLYCOL 3350 17 G PO PACK: 17.0000 g | PACK | Freq: Every day | ORAL | Status: DC | PRN

## 2023-07-27 NOTE — Discharge Summary (Signed)
 Physician Discharge Summary  Patient ID: Logan French MRN: [id number] DOB/AGE: [birth year] 89 y.o.  Admit date: 07/27/2023 Discharge date: 08/10/2023  Discharge Diagnoses:  Principal Problem:   Cellulitis Sepsis AKI Diabetes mellitus Hypertension Atrial fibrillation History of prostate cancer Hyperlipidemia Pernicious anemia Gout  Discharged Condition: Stable  Significant Diagnostic Studies: DG Knee 1-2 Views Left Result Date: 08/05/2023 CLINICAL DATA:  Left knee pain with history of cellulitis EXAM: LEFT KNEE - 1-2 VIEW COMPARISON:  None Available. FINDINGS: No evidence of fracture, dislocation, or joint effusion. There is mild medial compartment joint space narrowing and osteophyte formation. There is medial and lateral compartment chondrocalcinosis. Peripheral vascular calcifications are present. Soft tissues are otherwise within normal limits. IMPRESSION: 1. No acute fracture or dislocation. 2. Mild medial compartment osteoarthritis. 3. Chondrocalcinosis. Electronically Signed   By: Tyron Gallon M.D.   On: 08/05/2023 16:20   DG HIP UNILAT WITH PELVIS 1V LEFT Result Date: 08/03/2023 CLINICAL DATA:  Left hip pain EXAM: DG HIP (WITH OR WITHOUT PELVIS) 3V COMPARISON:  None Available. FINDINGS: Pelvic ring is intact. Degenerative changes of the hip joints are noted bilaterally. No acute fracture or dislocation is noted. Mild soft tissue prominence is noted laterally which may be related to subcutaneous edema possibly from recent fall. Correlate with clinical history. IMPRESSION: No acute fracture noted. Mild soft tissue swelling laterally. Electronically Signed   By: Violeta Grey M.D.   On: 08/03/2023 20:19   DG Chest 2 View Result Date: 08/03/2023 CLINICAL DATA:  Tachypnea EXAM: CHEST - 2 VIEW COMPARISON:  07/15/2023 FINDINGS: Cardiac shadow is enlarged but stable. Aortic calcifications are noted. The lungs are well aerated bilaterally. No focal infiltrate or effusion is noted. No  acute bony abnormality is seen. IMPRESSION: No active cardiopulmonary disease. Electronically Signed   By: Violeta Grey M.D.   On: 08/03/2023 20:18   PERIPHERAL VASCULAR CATHETERIZATION Result Date: 07/20/2023 Images from the original result were not included. Patient name: Logan French MRN: [id number] DOB: November 14, 1934 Sex: male 07/20/2023 Pre-operative Diagnosis: Left leg ulcer Post-operative diagnosis:  Same Surgeon:  Gareld June Procedure Performed:  1.  Ultrasound-guided access, right femoral artery  2.  Abdominal aortogram  3.  Selective injection with catheter left SFA  4.  Shockwave intra-arterial lithotripsy left popliteal artery  5.  Conscious sedation, 50 minutes  6.  Closure device, Celt Indications: This is a an 89 year old gentleman with recent intervention of the right leg who now has wounds on the left.  He comes in today for further evaluation and possible intervention Procedure:  The patient was identified in the holding area and taken to room 8.  The patient was then placed supine on the table and prepped and draped in the usual sterile fashion.  A time out was called.  Conscious sedation was administered with the use of IV fentanyl  and Versed  under continuous physician and nurse monitoring.  Heart rate, blood pressure, and oxygen saturation were continuously monitored.  Total sedation time was 50 minutes.  Ultrasound was used to evaluate the right common femoral artery.  It was patent .  A digital ultrasound image was acquired.  A micropuncture needle was used to access the right common femoral artery under ultrasound guidance.  An 018 wire was advanced without resistance and a micropuncture sheath was placed.  The 018 wire was removed and a benson wire was placed.  The micropuncture sheath was exchanged for a 5 french sheath.  An omniflush catheter was advanced over the wire  to the level of L-1.  An abdominal angiogram was obtained.  Next, using the omniflush catheter and a benson wire, the  aortic bifurcation was crossed and the catheter was placed into theleft external iliac artery and left runoff was obtained. Findings:  Aortogram: No significant renal artery stenosis was visualized.  The infrarenal abdominal aorta is widely patent without significant stenosis.  Bilateral common and external iliac arteries are widely patent.  Left Lower Extremity: The left common femoral and profundofemoral artery are calcified but patent without significant stenosis.  The superficial femoral artery is patent without significant stenosis.  The popliteal artery at the level of the patella has luminal narrowing of approximately 60% which is circumferentially calcified.  The below-knee popliteal artery is widely patent with single-vessel runoff via the peroneal artery which does reconstitute a posterior tibial at the ankle providing perfusion to the foot Intervention: After the above images were acquired the decision was made to proceed with intervention.  A 6 French 45 cm sheath was advanced into the left superficial femoral artery.  Selective injections were placed with a catheter at this level.  I then fully heparinized the patient.  An 014 [place] core wire was advanced across the lesion in the above-knee popliteal artery.  I selected a 5 x 80 shockwave intra-arterial lithotripsy balloon and performed balloon angioplasty at this level.  A total of 280 pulses were administered.  Completion imaging showed no residual stenosis with preserved runoff via the peroneal artery.  I was satisfied with these results.  The long sheath was exchanged out for a short 6 French sheath and a Celt was used for closure.  There were no complications. Impression:  #1  Successful shockwave intra-arterial lithotripsy of a 60 to 70% left above-knee popliteal artery stenosis  #2  Single-vessel runoff to the left leg via the peroneal artery which reconstitutes the posterior tibial at the ankle V. Gareld June, M.D., FACS [facility] Office: [phone] Pager:  [phone]   DG Foot Complete Left Result Date: 07/15/2023 CLINICAL DATA:  Ulcer, fever, altered mental status, foot wound EXAM: LEFT FOOT - COMPLETE 3+ VIEW COMPARISON:  None FINDINGS: Lateral scalloping of the head of the first metatarsal with adjacent mild sclerosis, this has a chronic appearance and could be related to gout arthropathy. 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 No malalignment at the Lisfranc joint. No fracture identified. No current bony destructive findings characteristic of osteomyelitis. Dorsal swelling of the forefoot, cellulitis is not excluded. Plantar calcaneal spur. Calcifications in the Achilles tendon along with a potential ossification along the myotendinous junction of the Achilles shown on the lateral projection. Atheromatous vascular calcifications. No well-defined gas tracking in the soft tissues. IMPRESSION: 1. Dorsal swelling of the forefoot, cellulitis is not excluded. 2. No current bony destructive findings characteristic of osteomyelitis. 3. Lateral scalloping of the head of the first metatarsal with adjacent mild sclerosis, this has a chronic appearance and could be related to gout arthropathy. 4. 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 5. Plantar calcaneal spur. 6. Calcifications in the Achilles tendon along with a potential ossification along the myotendinous junction of the Achilles shown on the lateral projection. 7. Atheromatous vascular calcifications. Electronically Signed   By: Freida Jes M.D.   On: 07/15/2023 19:46   DG Chest Port 1 View Result Date: 07/15/2023 CLINICAL DATA:  Sepsis, fever, altered mental status EXAM: PORTABLE CHEST 1 VIEW COMPARISON:  06/22/2023 FINDINGS:  Moderate enlargement of the cardiopericardial silhouette. No findings of pulmonary edema. No blunting of the costophrenic angles. The  lungs appear clear. Low lung volumes. Degenerative AC joint spurring on the right. IMPRESSION: 1. Moderate enlargement of the cardiopericardial silhouette, without pulmonary edema. 2. Degenerative AC joint spurring on the right. Electronically Signed   By: Freida Jes M.D.   On: 07/15/2023 19:43    Labs:  Basic Metabolic Panel: Recent Labs  Lab 08/03/23 0546 08/04/23 0505 08/05/23 1046 08/06/23 0643  NA 134* 133* 130* 134*  K 4.2 3.8 4.3 4.1  CL 102 103 96* 102  CO2 24 23 20* 26  GLUCOSE 100* 120* 179* 101*  BUN 10 10 14 12   CREATININE 0.96 0.84 1.20 0.83  CALCIUM  8.5* 8.1* 8.7* 8.5*    CBC: Recent Labs  Lab 08/05/23 1046 08/06/23 0643 08/07/23 0725  WBC 11.8* 7.7 6.9  NEUTROABS 9.7* 5.1 4.5  HGB 12.2* 10.9* 11.7*  HCT 37.8* 33.7* 35.9*  MCV 85.9 86.0 86.3  PLT 458* 368 385    CBG: No results for input(s): "GLUCAP" in the last 168 hours.   Brief HPI:   Logan French is a 89 y.o. right-handed male with history significant for atrial fibrillation and cardioversion 10/07/2021 per Dr.Kardie, hyperlipidemia, hypertension, pernicious anemia, diabetes mellitus, prostate cancer, peripheral vascular disease with angioplasty of the right popliteal artery 05/17/2023 per Dr. Susi Eric.  Per chart review patient lives with spouse.  Two-level home bed and bath on main level.  Contact-guard for mobility.  Patient did receive CIR for 02/03/2024 - 07/07/2023 due to sepsis secondary to right lower extremity cellulitis/streptococcal bacteremia.  Presented 07/15/2023 with increasing lethargy and disorientation with fever of 102.6 as well as blistering of the left foot with ulceration and erythema spreading proximally up the calf.  Admission chemistries unremarkable except WBC 15,300 sodium 132 chloride 97 glucose 151 creatinine 1.26 total bilirubin 1.4.  Blood cultures no growth to date.  X-rays of the left foot show dorsal swelling of the forefoot with no current bony destruction.  Patient  placed on sepsis protocol status post and received 1 L lactated Ringer 's.  Patient's lactic acidosis was 3.7 chest x-ray unremarkable.  Placed on broad-spectrum antibiotics.  Underwent abdominal aortogram showing no significant renal artery stenosis visualized.  The infrarenal abdominal aorta widely patent without stenosis.  Bilateral common and external iliac arteries widely patent.  The left common femoral and profundofemoral artery calcified but patent without significant stenosis.  The popliteal artery at the level of the patella had luminal narrowing of approximately 60% circumferentially calcified.  The below-knee popliteal artery widely patent with single-vessel runoff via the peroneal artery providing perfusion to the foot.  Patient underwent successful shockwave intra-arterial lithotripsy of the 60-70% left above-knee popliteal artery stenosis 07/20/2023 per Dr. Charlotte Cookey.  Placed on Augmentin /doxycycline  completing course.  Plan was to repeat Dopplers as an outpatient 4 to 6 weeks.  He remained on chronic Plavix  as prior to admission for peripheral vascular disease.  Blood pressure and heart rate closely monitored with history of atrial fibrillation maintained on Librexia trial drug comparing Eliquis .  Hospital course AKI resolved with gentle IV fluids latest creatinine 0.80.  Therapy evaluations completed due to patient decreased functional mobility was admitted for a comprehensive rehab program.   Hospital Course: Logan French was admitted to rehab 07/27/2023 for inpatient therapies to consist of PT, ST and OT at least three hours five days a week. Past admission physiatrist, therapy team and rehab RN  have worked together to provide customized collaborative inpatient rehab.  Pertaining to patient's cellulitis/sepsis left lower extremity status post angiogram 07/20/2023 with intra-arterial lithotripsy.  Plan follow-up Dopplers 4 to 6 weeks follow-up vascular surgery.  He remained on chronic Plavix  as  prior to admission.  Pain management with use of oxycodone .  He was completing a course of Augmentin  and doxycycline  for his cellulitis remaining afebrile.  AKI resolved follow-up chemistries monitoring intake and output.  Blood sugars controlled hemoglobin A1c 6.4 SSI.  Patient had been on metformin  prior to admission this could be resumed if renal function remained stable.  Blood pressure remains soft with Norvasc  currently discontinued it was on low-dose HCTZ and Toprol  decreased to 12.5 mg daily.  Monitored with increased mobility and patient would need outpatient follow-up.  History of prostate cancer continued on Proscar .  In regards to patient's atrial fibrillation cardiac rate controlled remained on Librexia trial drug as well as Toprol  cardiac rate controlled.  Crestor  and Zetia  ongoing for hyperlipidemia..  Wound care to lower extremities followed by wound care nurse as well as placed on doxycycline  for wound coverage with home health nurse arranged for wound care.   Blood pressures were monitored on TID basis and remained soft and monitored  Diabetes has been monitored with ac/hs CBG checks and SSI was use prn for tighter BS control.    Rehab course: During patient's stay in rehab weekly team conferences were held to monitor patient's progress, set goals and discuss barriers to discharge. At admission, patient required minimal assist 25 feet rolling walker contact-guard assist sit to supine  Physical exam.  Blood pressure 157/70 pulse 67 temperature 97.3 respirations 18 oxygen saturation is 97% room air Constitutional.  No acute distress HEENT Head.  Normocephalic and atraumatic Eyes.  Pupils round and reactive to light no discharge without nystagmus Neck.  Supple nontender no JVD without thyromegaly Cardiac regular rate and rhythm without any extra sounds or murmur heard Abdomen.  Soft nontender positive bowel sounds without rebound Respiratory effort normal no respiratory distress  without wheeze Skin.  Patient with 2 incisions along the calf with minimal serous drainage.  Healing wounds/scabs on first MTP and great toe of left foot.  Right Achilles area with linear wound with minimal serous drainage/granulation, measuring about 4 cm x 1 cm and area almost closed. Neurologic.  Alert oriented x 3.  Cranial nerve exam unremarkable.  MMT bilateral upper extremities grossly 4/5 proximal to distal.  Bilateral lower extremities 3+ hip flexors and knee extension and 4 - ADF/PF.  He/She  has had improvement in activity tolerance, balance, postural control as well as ability to compensate for deficits. He/She has had improvement in functional use RUE/LUE  and RLE/LLE as well as improvement in awareness.  Working with energy conservation techniques.  Ambulating minimal assist with a rolling walker.  Contact-guard supine to sit as well as sit to supine.  Bed mobility contact-guard.  Contact-guard for grooming to wash and dry hands as well as face.  Min to mod assist lower body bathing and needing assist for lower body dressing.  Full family teaching completed plan discharge to home       Disposition:  Discharge disposition: 06-Home-Health Care Svc        Diet: Regular  Special Instructions: No driving smoking or alcohol   Patient may shower.  Remove all dressings, allow patient to shower and wash legs with soap and water and pat dry.  Apply Eucerin to lower legs to moisture intact skin.  Cleanse right lower leg and left foot wounds with Vashe wound cleanser, do not rinse and allowed to air dry.  Apply Xeroform gauze to wound beds daily and cover with silicone foam or dry gauze and Kerrlex roll gauze which ever preferred.  Cleanse left posterior leg wound with Vashe, apply Santyl to wound bed daily, cover with normal saline moist gauze, then dry gauze and silicone foam or ABD pad and Kerlex roll gauze whichever preferred  Medications at discharge. 1.  Tylenol  as needed 2.   Voltaren  gel 2 g 4 times daily 3.  Plavix  75 mg p.o. daily 4.  Zetia  10 mg p.o. daily 5.  Proscar  5 mg p.o. daily 6.  Toprol -XL 12.5 mg every 12 hours 7.  Protonix  40 mg p.o. daily 8.  Crestor  20 mg p.o. daily 9.Librexia trial study 10.  Oxycodone  5 mg every 6 hours as needed pain 11.  Colchicine  0.6 mg daily 12.  Vitamin B12 500 mcg daily 13.  Voltaren  gel 2 g 4 times daily to affected area 14.  Vitamin D  1000 units daily 15.  Hydrochlorothiazide  12.5 mg daily 16.  Metformin  500 mg daily with breakfast 17.  Doxycycline  100 mg every 12 hours x 3 days and stop  30-35 minutes were spent completing discharge summary and discharge planning     Follow-up Information     Raulkar, Keven Pel, MD Follow up.   Specialty: Physical Medicine and Rehabilitation Why: No formal follow-up needed Contact information: [address] [place] [address] [phone]         Margherita Shell, MD Follow up.   Specialties: Vascular Surgery, Cardiology Why: Call for appointment Contact information: [address] [place] [address] [phone]         Millicent Ally, MD Follow up.   Specialty: Cardiology Why: Call for appointment as needed Contact information: [address] [place] [address] [phone]                 Signed: Sterling Eisenmenger 08/10/2023, 4:37 AM

## 2023-07-27 NOTE — TOC Transition Note (Signed)
 Transition of Care [facility]) - Discharge Note   Patient Details  Name: Logan French MRN: [id number] Date of Birth: 19-May-1934  Transition of Care [facility]) CM/SW Contact:  Cosimo Diones, RN Phone Number: 07/27/2023, 9:54 AM   Clinical Narrative: Case Manager received an update that the patient has bed availability today for inpatient rehab. No further needs identified at this time.      Final next level of care: IP Rehab Facility Barriers to Discharge: No Barriers Identified  Discharge Plan and Services Additional resources added to the After Visit Summary for   In-house Referral: NA Discharge Planning Services: CM Consult Post Acute Care Choice: IP Rehab            Social Drivers of Health (SDOH) Interventions SDOH Screenings   Food Insecurity: No Food Insecurity (07/16/2023)  Housing: Low Risk  (07/16/2023)  Transportation Needs: No Transportation Needs (07/16/2023)  Utilities: Not At Risk (07/16/2023)  Social Connections: Socially Integrated (07/16/2023)  Tobacco Use: Low Risk  (07/16/2023)   Readmission Risk Interventions     No data to display

## 2023-07-27 NOTE — H&P (Signed)
 Physical Medicine and Rehabilitation Admission H&P    Chief Complaint  Patient presents with   Fatigue   Altered Mental Status   Wound Check   Fever  : HPI: Logan French is an 89 year old right-handed male with history of atrial fibrillation and cardioversion 10/07/2021 per Dr. Jerryl Morin, hyperlipidemia, syncope, hypertension, pernicious anemia, diabetes mellitus, prostate cancer, peripheral vascular disease status post angioplasty of the right popliteal artery 05/17/2023 per Dr. Susi Eric.  Patient did receive CIR 06/25/2023 - 07/07/2023 due to sepsis secondary to right lower extremity cellulitis/streptococcal bacteremia and was discharged home ambulating with a rolling walker contact-guard assist for eating to close supervision.  Per chart review patient lives with spouse.  Two-level home bed and bath main level.  Presented 07/15/2023 with increasing lethargy and disorientation and fever 102.6 as well as blistering of the left foot with ulceration as well as erythema spreading approximately up to the calf.  Admission chemistry showed WBC 15,300, sodium 132, chloride 97, glucose 151, creatinine 1.26, total bilirubin 1.4.  Blood cultures no growth to date.  X-rays of left foot showed dorsal swelling of the forefoot with no current bony destruction.  Patient placed on sepsis protocol status post 1 L lactated Ringer 's.  Patient lactic acidosis was 3.7 and chest x-ray unremarkable.  Placed on broad-spectrum antibiotics.  Underwent abdominal aortogram showing no significant renal artery stenosis visualized.  The infrarenal abdominal aorta widely patent without significant stenosis.  Bilateral common and external iliac arteries widely patent.  The left common femoral and profundofemoral artery calcified but patent without significant stenosis.  The popliteal artery at the level of the patella had luminal narrowing of approximately 60% circumferentially calcified.  The below-knee popliteal artery widely patent  with single-vessel runoff via the peroneal artery providing perfusion to the foot.  Patient underwent successful shockwave intra-arterial lithotripsy of the 60-70% left above-knee popliteal artery stenosis 07/20/2023 per Dr. Charlotte Cookey.  Patient is currently completing a course of Augmentin /doxycycline .  Plan is to repeat Doppler as an outpatient in 4 to 6 weeks.  He continues with chronic Plavix  for peripheral vascular disease as prior to admission.  Blood pressure and heart rate closely monitor with history of atrial fibrillation maintained on Librexa trial drug (comparing Eliquis  versus Milvexian-factor XI inhibitor).  Hospital course AKI resolved with gentle IV fluids latest creatinine 0.80.  Patient's mental status has cleared appears to be close to baseline.  Therapy evaluations completed due to patient's decreased functional mobility was admitted for a comprehensive rehab program.  Review of Systems  Constitutional:  Positive for fever and malaise/fatigue.  Eyes:  Negative for blurred vision and double vision.  Respiratory:  Negative for cough, shortness of breath and wheezing.   Cardiovascular:  Positive for palpitations and leg swelling. Negative for chest pain.  Gastrointestinal:  Positive for constipation. Negative for nausea and vomiting.  Genitourinary:  Positive for urgency. Negative for dysuria, flank pain and hematuria.  Musculoskeletal:  Positive for myalgias.  Skin:  Negative for rash.  Neurological:  Positive for dizziness.       Syncope  All other systems reviewed and are negative.  Past Medical History:  Diagnosis Date   A-fib (HCC)    B12 deficiency    Diverticulosis    Fatigue    History of colonic polyps    Hyperlipemia    Hypertension    Pernicious anemia    Prostate cancer (HCC)    Syncope    Syncope    Past Surgical History:  Procedure Laterality  Date   ABDOMINAL AORTOGRAM N/A 07/20/2023   Procedure: ABDOMINAL AORTOGRAM;  Surgeon: Margherita Shell, MD;  Location:  [facility];  Service: Cardiovascular;  Laterality: N/A;   ABDOMINAL AORTOGRAM W/LOWER EXTREMITY N/A 05/17/2023   Procedure: ABDOMINAL AORTOGRAM W/LOWER EXTREMITY;  Surgeon: Philipp Brawn, MD;  Location: [facility] INVASIVE CV LAB;  Service: Cardiovascular;  Laterality: N/A;   CARDIOVERSION N/A 10/07/2021   Procedure: CARDIOVERSION;  Surgeon: Jerryl Morin, DO;  Location: [facility] ENDOSCOPY;  Service: Cardiovascular;  Laterality: N/A;   IR ANGIO INTRA EXTRACRAN SEL COM CAROTID INNOMINATE BILAT MOD SED  09/10/2017   IR ANGIO VERTEBRAL SEL SUBCLAVIAN INNOMINATE BILAT MOD SED  09/10/2017   LOWER EXTREMITY ANGIOGRAPHY N/A 07/20/2023   Procedure: Lower Extremity Angiography;  Surgeon: Margherita Shell, MD;  Location: [facility] INVAS[facility] CV [facility];  Service: Cardiovascular;  Laterality: N/A;   LOWER EXTREMITY INTERVENTION Left 07/20/2023   Procedure: LOWER EXTREMITY INTERVENTION;  Surgeon: Margherita Shell, MD;  Location: [facility] INVAS[facility] CV [facility];  Service: Cardiovascular;  Laterality: Left;   PERIPHERAL INTRAVASCULAR LITHOTRIPSY Right 05/17/2023   Procedure: PERIPHERAL INTRAVASCULAR LITHOTRIPSY;  Surgeon: Philipp Brawn, MD;  Location: [facility] INVASIVE CV LAB;  Service: Cardiovascular;  Laterality: Right;  SFA-POP   PERIPHERAL INTRAVASCULAR LITHOTRIPSY Left 07/20/2023   Procedure: PERIPHERAL INTRAVASCULAR LITHOTRIPSY;  Surgeon: Margherita Shell, MD;  Location: [facility] INVAS[facility] CV LAB;  Service: Cardiovascular;  Laterality: Left;   PERIPHERAL VASCULAR BALLOON ANGIOPLASTY Right 05/17/2023   Procedure: PERIPHERAL VASCULAR BALLOON ANGIOPLASTY;  Surgeon: Philipp Brawn, MD;  Location: [facility] INV[facility] CV [facility];  Service: Cardiovascular;  Laterality: Right;  SFA-POP   History reviewed. No pertinent family history. Social History:  reports that he has never smoked. He has never used smokeless tobacco. He reports current alcohol  use of about 2.0 standard drinks of alcohol  per week. He reports that he does not use drugs. Allergies: No Known  Allergies Medications Prior to Admission  Medication Sig Dispense Refill   acetaminophen  (TYLENOL ) 325 MG tablet Take 2 tablets (650 mg total) by mouth every 6 (six) hours as needed. (Patient taking differently: Take 650 mg by mouth every 6 (six) hours as needed for mild pain (pain score 1-3) or moderate pain (pain score 4-6).)     amLODipine (NORVASC) 5 MG tablet Take 5 mg by mouth daily.     clopidogrel  (PLAVIX ) 75 MG tablet Take 1 tablet (75 mg total) by mouth daily. 30 tablet 0   colchicine  0.6 MG tablet Take 1 tablet (0.6 mg total) by mouth daily. 30 tablet 0   cyanocobalamin  (VITAMIN B12) 1000 MCG tablet Take 0.5 tablets (500 mcg total) by mouth daily. 15 tablet 0   diclofenac  Sodium (VOLTAREN ) 1 % GEL Apply 2 g topically 4 (four) times daily. 100 g 0   ezetimibe  (ZETIA ) 10 MG tablet Take 1 tablet (10 mg total) by mouth daily. 30 tablet 0   finasteride  (PROSCAR ) 5 MG tablet Take 1 tablet (5 mg total) by mouth daily. 30 tablet 0   furosemide  (LASIX ) 20 MG tablet Take 20 mg by mouth daily.     hydrochlorothiazide  (HYDRODIURIL ) 12.5 MG tablet Take 1 tablet (12.5 mg total) by mouth daily. 30 tablet 0   leptospermum manuka honey (MEDIHONEY) PSTE paste Apply 1 Application topically daily. 44 mL 0   metFORMIN  (GLUCOPHAGE ) 500 MG tablet Take 1 tablet (500 mg total) by mouth daily with breakfast. 30 tablet 0   metoprolol  succinate (TOPROL -XL) 25 MG 24 hr tablet Take 1 tablet (25 mg  total) by mouth every 12 (twelve) hours. 60 tablet 0   oxyCODONE  (OXY IR/ROXICODONE ) 5 MG immediate release tablet Take 1 tablet (5 mg total) by mouth every 8 (eight) hours as needed for severe pain (pain score 7-10) or breakthrough pain. 30 tablet 0   pantoprazole  (PROTONIX ) 40 MG tablet Take 1 tablet (40 mg total) by mouth daily at 12 noon. 30 tablet 0   rosuvastatin  (CRESTOR ) 20 MG tablet Take 1 tablet (20 mg total) by mouth daily. 30 tablet 0   STUDY - LIBREXIA-AF - apixaban  5 mg or placebo capsule (PI-Sethi) Take 1  capsule (5 mg total) by mouth 2 (two) times daily. Take at approximately the same time of day with or without food. Please bring bottle back with you to every visit; do not discard bottle. For Investigational Use Only. 280 capsule 0   STUDY - LIBREXIA-AF - [id number] (Milvexian) 100 mg or placebo tablet (PI-Sethi) Take 1 tablet by mouth 2 (two) times daily. Take at approximately the same time of day with or without food. Please bring bottle back with you to every visit; do not discard bottle. For Investigational Use Only. 280 tablet 0   vitamin D3 (CHOLECALCIFEROL ) 25 MCG tablet Take 1 tablet (1,000 Units total) by mouth daily. 30 tablet 0      Home: Home Living Family/patient expects to be discharged to:: Private residence Living Arrangements: Spouse/significant other Available Help at Discharge: Family, Available 24 hours/day (son, Tyrone Gallop to hire 4-5 hrs per day caregiver asisstance) Type of Home: House Home Access: Stairs to enter [employer] of Steps: 3 STE front entry with R ledge vs rail, planning to install rail Entrance Stairs-Rails: Can reach both Home Layout: Multi-level, Able to live on main level with bedroom/bathroom Alternate Level Stairs-Number of Steps: 2 steps down into kitchen/living room with railing. main level has bedroom/bathroom and an upstairs that pt does not have to use Bathroom Shower/Tub: Psychologist, counselling, Door [employer] Toilet: Standard Bathroom Accessibility: Yes Home Equipment: [employer] - single point, Rollator (4 wheels), Rolling Walker (2 wheels), Shower seat, Hand held shower head, Toilet riser, Grab bars - toilet, Grab bars - tub/shower Additional Comments: has SPC  Lives With: Spouse   Functional History: Prior Function Prior Level of Function : Needs assist Mobility Comments: using RW at home from recent discharge from [facility] 4/23 ADLs Comments: Ind with ADLs, toileting, wife helps with sock/shoes, sponge bathing  Functional Status:   Mobility: Bed Mobility Overal bed mobility: Needs Assistance Bed Mobility: Supine to Sit, Sit to Supine Supine to sit: Contact guard, Used rails, HOB elevated Sit to supine: Contact guard assist, Used rails General bed mobility comments: increased time and effort Transfers Overall transfer level: Needs assistance Equipment used: Rolling walker (2 wheels) Transfers: Sit to/from Stand Sit to Stand: From elevated surface, Mod assist Bed to/from chair/wheelchair/BSC transfer type:: Step pivot Step pivot transfers: Min assist, +2 physical assistance General transfer comment: increased time and multi attempts to power up from elevated bed and 3 in 1 over toilet Ambulation/Gait Ambulation/Gait assistance: Min assist Gait Distance (Feet): 25 Feet (x 2) Assistive device: Rolling walker (2 wheels) Gait Pattern/deviations: Trunk flexed, Antalgic, Decreased stride length, Step-to pattern General Gait Details: leading with LLE, cues to look up, seated rest break between gait trials Gait velocity: decreased Gait velocity interpretation: <1.31 ft/sec, indicative of household ambulator    ADL: ADL Overall ADL's : Needs assistance/impaired Eating/Feeding: Independent Grooming: Wash/dry hands, Wash/dry face, Contact guard assist, Standing Upper Body Bathing: Contact guard assist,  Sitting Lower Body Bathing: Moderate assistance, Sitting/lateral leans Upper Body Dressing : Contact guard assist, Sitting Lower Body Dressing: Total assistance Lower Body Dressing Details (indicate cue type and reason): assisted for socks Toilet Transfer: Moderate assistance, Ambulation, Rolling walker (2 wheels), Regular Toilet, BSC/3in1, Grab bars Toileting- Clothing Manipulation and Hygiene: Maximal assistance, Sit to/from stand Functional mobility during ADLs: Moderate assistance, Rolling walker (2 wheels) General ADL Comments: focused on mobility and transfers  Cognition: Cognition Orientation Level: Oriented  X4 Cognition Arousal: Alert Behavior During Therapy: WFL for tasks assessed/performed  Physical Exam: Blood pressure (!) 157/70, pulse 67, temperature (!) 97.3 F (36.3 C), temperature source Oral, resp. rate 18, height 6' (1.829 m), weight 101.6 kg, SpO2 97%. Physical Exam Constitutional:      General: He is not in acute distress. HENT:     Head: Normocephalic and atraumatic.     Right Ear: External ear normal.     Left Ear: External ear normal.     Nose: Nose normal.     Mouth/Throat:     Mouth: Mucous membranes are moist.  Eyes:     Conjunctiva/sclera: Conjunctivae normal.     Pupils: Pupils are equal, round, and reactive to light.  Cardiovascular:     Rate and Rhythm: Normal rate and regular rhythm.     Heart sounds: No murmur heard.    No gallop.  Pulmonary:     Effort: Pulmonary effort is normal. No respiratory distress.     Breath sounds: No wheezing or rales.  Abdominal:     General: Bowel sounds are normal. There is no distension.     Palpations: Abdomen is soft.     Tenderness: There is no abdominal tenderness.  Musculoskeletal:     Cervical back: Normal range of motion.     Comments: Pt's left leg is tender to palpation. Only trace edema bilateral LE's.   Skin:    Comments: Pt with 2 incisions along left calf with minimal serous drainage. Healing wounds/scabs on 1st MT and great toe of left foot. Right achilles area with linear wound with minimal serous drainage/granulation, measuring about 4cm x 1cm in area, almost closed.   Neurological:     Mental Status: He is alert.     Comments: Alert and oriented x 3. Normal insight and awareness. Intact Memory. Normal language and speech. Cranial nerve exam unremarkable. MMT: BUE grossly 4/5 prox to distal. BLE 3+HF and KE and 4- ADF/PF. Mild sensory loss distally in both legs but otherwise sensory exam appears intact. DTR's 1+. No abnl resting tone.    Psychiatric:        Mood and Affect: Mood normal.        Behavior:  Behavior normal.        Thought Content: Thought content normal.     Results for orders placed or performed during the hospital encounter of 07/15/23 (from the past 48 hours)  Glucose, capillary     Status: Abnormal   Collection Time: 07/25/23  7:44 AM  Result Value Ref Range   Glucose-Capillary 143 (H) 70 - 99 mg/dL    Comment: Glucose reference range applies only to samples taken after fasting for at least 8 hours.  Glucose, capillary     Status: Abnormal   Collection Time: 07/25/23 12:20 PM  Result Value Ref Range   Glucose-Capillary 112 (H) 70 - 99 mg/dL    Comment: Glucose reference range applies only to samples taken after fasting for at least 8 hours.  Glucose,  capillary     Status: Abnormal   Collection Time: 07/25/23  5:16 PM  Result Value Ref Range   Glucose-Capillary 115 (H) 70 - 99 mg/dL    Comment: Glucose reference range applies only to samples taken after fasting for at least 8 hours.  Glucose, capillary     Status: Abnormal   Collection Time: 07/25/23  9:11 PM  Result Value Ref Range   Glucose-Capillary 172 (H) 70 - 99 mg/dL    Comment: Glucose reference range applies only to samples taken after fasting for at least 8 hours.  Glucose, capillary     Status: Abnormal   Collection Time: 07/26/23  8:07 AM  Result Value Ref Range   Glucose-Capillary 145 (H) 70 - 99 mg/dL    Comment: Glucose reference range applies only to samples taken after fasting for at least 8 hours.  Glucose, capillary     Status: Abnormal   Collection Time: 07/26/23 12:19 PM  Result Value Ref Range   Glucose-Capillary 116 (H) 70 - 99 mg/dL    Comment: Glucose reference range applies only to samples taken after fasting for at least 8 hours.  Glucose, capillary     Status: Abnormal   Collection Time: 07/26/23  5:43 PM  Result Value Ref Range   Glucose-Capillary 152 (H) 70 - 99 mg/dL    Comment: Glucose reference range applies only to samples taken after fasting for at least 8 hours.  Glucose,  capillary     Status: Abnormal   Collection Time: 07/26/23  8:51 PM  Result Value Ref Range   Glucose-Capillary 121 (H) 70 - 99 mg/dL    Comment: Glucose reference range applies only to samples taken after fasting for at least 8 hours.   No results found.    Blood pressure (!) 157/70, pulse 67, temperature (!) 97.3 F (36.3 C), temperature source Oral, resp. rate 18, height 6' (1.829 m), weight 101.6 kg, SpO2 97%.  Medical Problem List and Plan: 1. Functional deficits secondary to sepsis secondary to cellulitis.  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  Plan follow-up Dopplers 4 to 6 weeks.  -patient may  shower if left leg is covered  -ELOS/Goals: 7-10 days, supervision to mod I with PT and sup/min with OT 2.  Antithrombotics: -DVT/anticoagulation:  Mechanical: Antiembolism stockings, thigh (TED hose) Bilateral lower extremities  -antiplatelet therapy: Plavix  75 mg daily 3. Pain Management: Oxycodone  as needed  -pain seems fairly controlled 4. Mood/Behavior/Sleep: Provide emotional support  -antipsychotic agents: N/A 5. Neuropsych/cognition: This patient is capable of making decisions on his own behalf. 6. Skin/Wound Care: Wounds on both legs appear to be healing nicely.   -continue dry dressings with kerlix to left calf and foot as well as right achilles area 7. Fluids/Electrolytes/Nutrition: Routine in and outs with follow-up chemistries 8.  ID/cellulitis..  Completing course of Augmentin /doxycycline  9.  AKI.  Resolved.  Follow-up chemistries 10.  Diabetes mellitus.  Hemoglobin A1c 6.4.  SSI 11.  Hypertension.  Norvasc 2.5 mg daily.  Monitor with increased mobility  -bp with some fluctuation recently 12.  History of prostate cancer.  Proscar  5 mg daily 13.  Atrial fibrillation.  Cardiac rate controlled.  Continue Librexia trial drug per cardiology services as well as Toprol -XL 12.5 mg every 12 hours 14.   Hyperlipidemia.  Crestor /Zetia    Sterling Eisenmenger, PA-C 07/27/2023

## 2023-07-27 NOTE — H&P (Signed)
 Physical Medicine and Rehabilitation Admission H&P        Chief Complaint  Patient presents with   Fatigue   Altered Mental Status   Wound Check   Fever  : HPI: Logan French is an 89 year old right-handed male with history of atrial fibrillation and cardioversion 10/07/2021 per Dr. Jerryl Morin, hyperlipidemia, syncope, hypertension, pernicious anemia, diabetes mellitus, prostate cancer, peripheral vascular disease status post angioplasty of the right popliteal artery 05/17/2023 per Dr. Susi Eric.  Patient did receive CIR 06/25/2023 - 07/07/2023 due to sepsis secondary to right lower extremity cellulitis/streptococcal bacteremia and was discharged home ambulating with a rolling walker contact-guard assist for eating to close supervision.  Per chart review patient lives with spouse.  Two-level home bed and bath main level.  Presented 07/15/2023 with increasing lethargy and disorientation and fever 102.6 as well as blistering of the left foot with ulceration as well as erythema spreading approximately up to the calf.  Admission chemistry showed WBC 15,300, sodium 132, chloride 97, glucose 151, creatinine 1.26, total bilirubin 1.4.  Blood cultures no growth to date.  X-rays of left foot showed dorsal swelling of the forefoot with no current bony destruction.  Patient placed on sepsis protocol status post 1 L lactated Ringer 's.  Patient lactic acidosis was 3.7 and chest x-ray unremarkable.  Placed on broad-spectrum antibiotics.  Underwent abdominal aortogram showing no significant renal artery stenosis visualized.  The infrarenal abdominal aorta widely patent without significant stenosis.  Bilateral common and external iliac arteries widely patent.  The left common femoral and profundofemoral artery calcified but patent without significant stenosis.  The popliteal artery at the level of the patella had luminal narrowing of approximately 60% circumferentially calcified.  The below-knee popliteal artery widely patent  with single-vessel runoff via the peroneal artery providing perfusion to the foot.  Patient underwent successful shockwave intra-arterial lithotripsy of the 60-70% left above-knee popliteal artery stenosis 07/20/2023 per Dr. Charlotte Cookey.  Patient is currently completing a course of Augmentin /doxycycline .  Plan is to repeat Doppler as an outpatient in 4 to 6 weeks.  He continues with chronic Plavix  for peripheral vascular disease as prior to admission.  Blood pressure and heart rate closely monitor with history of atrial fibrillation maintained on Librexa trial drug (comparing Eliquis  versus Milvexian-factor XI inhibitor).  Hospital course AKI resolved with gentle IV fluids latest creatinine 0.80.  Patient's mental status has cleared appears to be close to baseline.  Therapy evaluations completed due to patient's decreased functional mobility was admitted for a comprehensive rehab program.   Review of Systems  Constitutional:  Positive for fever and malaise/fatigue.  Eyes:  Negative for blurred vision and double vision.  Respiratory:  Negative for cough, shortness of breath and wheezing.   Cardiovascular:  Positive for palpitations and leg swelling. Negative for chest pain.  Gastrointestinal:  Positive for constipation. Negative for nausea and vomiting.  Genitourinary:  Positive for urgency. Negative for dysuria, flank pain and hematuria.  Musculoskeletal:  Positive for myalgias.  Skin:  Negative for rash.  Neurological:  Positive for dizziness.       Syncope  All other systems reviewed and are negative.       Past Medical History:  Diagnosis Date   A-fib (HCC)     B12 deficiency     Diverticulosis     Fatigue     History of colonic polyps     Hyperlipemia     Hypertension     Pernicious anemia     Prostate cancer (HCC)  Syncope     Syncope               Past Surgical History:  Procedure Laterality Date   ABDOMINAL AORTOGRAM N/A 07/20/2023    Procedure: ABDOMINAL AORTOGRAM;  Surgeon:  Margherita Shell, MD;  Location: [facility] INVASIVE CV [facility];  Service: Cardiovascular;  Laterality: N/A;   ABDOMINAL AORTOGRAM W/LOWER EXTREMITY N/A 05/17/2023    Procedure: ABDOMINAL AORTOGRAM W/LOWER EXTREMITY;  Surgeon: Philipp Brawn, MD;  Location: [facility]VASIVE CV LAB;  Service: Cardiovascular;  Laterality: N/A;   CARDIOVERSION N/A 10/07/2021    Procedure: CARDIOVERSION;  Surgeon: Jerryl Morin, DO;  Location: [facility] ENDOSCOPY;  Service: Cardiovascular;  Laterality: N/A;   IR ANGIO INTRA EXTRACRAN SEL COM CAROTID INNOMINATE BILAT MOD SED   09/10/2017   IR ANGIO VERTEBRAL SEL SUBCLAVIAN INNOMINATE BILAT MOD SED   09/10/2017   LOWER EXTREMITY ANGIOGRAPHY N/A 07/20/2023    Procedure: Lower Extremity Angiography;  Surgeon: Margherita Shell, MD;  Location: [facility] INVASIVE CV [facility];  Service: Cardiovascular;  Laterality: N/A;   LOWER EXTREMITY INTERVENTION Left 07/20/2023    Procedure: LOWER EXTREMITY INTERVENTION;  Surgeon: Margherita Shell, MD;  Location: [facility] INV[facility] CV [facility];  Service: Cardiovascular;  Laterality: Left;   PERIPHERAL INTRAVASCULAR LITHOTRIPSY Right 05/17/2023    Procedure: PERIPHERAL INTRAVASCULAR LITHOTRIPSY;  Surgeon: Philipp Brawn, MD;  Location: [facility] INVASIVE CV LAB;  Service: Cardiovascular;  Laterality: Right;  SFA-POP   PERIPHERAL INTRAVASCULAR LITHOTRIPSY Left 07/20/2023    Procedure: PERIPHERAL INTRAVASCULAR LITHOTRIPSY;  Surgeon: Margherita Shell, MD;  Location: [facility] INVASIVE CV LAB;  Service: Cardiovascular;  Laterality: Left;   PERIPHERAL VASCULAR BALLOON ANGIOPLASTY Right 05/17/2023    Procedure: PERIPHERAL VASCULAR BALLOON ANGIOPLASTY;  Surgeon: Philipp Brawn, MD;  Location: [facility] INV[facility] CV [facility];  Service: Cardiovascular;  Laterality: Right;  SFA-POP        History reviewed. No pertinent family history.     Social History:  reports that he has never smoked. He has never used smokeless tobacco. He reports current alcohol  use of about 2.0 standard drinks of alcohol  per week. He reports that he does  not use drugs. Allergies:  Allergies  No Known Allergies         Medications Prior to Admission  Medication Sig Dispense Refill   acetaminophen  (TYLENOL ) 325 MG tablet Take 2 tablets (650 mg total) by mouth every 6 (six) hours as needed. (Patient taking differently: Take 650 mg by mouth every 6 (six) hours as needed for mild pain (pain score 1-3) or moderate pain (pain score 4-6).)       amLODipine (NORVASC) 5 MG tablet Take 5 mg by mouth daily.       clopidogrel  (PLAVIX ) 75 MG tablet Take 1 tablet (75 mg total) by mouth daily. 30 tablet 0   colchicine  0.6 MG tablet Take 1 tablet (0.6 mg total) by mouth daily. 30 tablet 0   cyanocobalamin  (VITAMIN B12) 1000 MCG tablet Take 0.5 tablets (500 mcg total) by mouth daily. 15 tablet 0   diclofenac  Sodium (VOLTAREN ) 1 % GEL Apply 2 g topically 4 (four) times daily. 100 g 0   ezetimibe  (ZETIA ) 10 MG tablet Take 1 tablet (10 mg total) by mouth daily. 30 tablet 0   finasteride  (PROSCAR ) 5 MG tablet Take 1 tablet (5 mg total) by mouth daily. 30 tablet 0   furosemide  (LASIX ) 20 MG tablet Take 20 mg by mouth daily.       hydrochlorothiazide  (HYDRODIURIL ) 12.5 MG tablet Take 1  tablet (12.5 mg total) by mouth daily. 30 tablet 0   leptospermum manuka honey (MEDIHONEY) PSTE paste Apply 1 Application topically daily. 44 mL 0   metFORMIN  (GLUCOPHAGE ) 500 MG tablet Take 1 tablet (500 mg total) by mouth daily with breakfast. 30 tablet 0   metoprolol  succinate (TOPROL -XL) 25 MG 24 hr tablet Take 1 tablet (25 mg total) by mouth every 12 (twelve) hours. 60 tablet 0   oxyCODONE  (OXY IR/ROXICODONE ) 5 MG immediate release tablet Take 1 tablet (5 mg total) by mouth every 8 (eight) hours as needed for severe pain (pain score 7-10) or breakthrough pain. 30 tablet 0   pantoprazole  (PROTONIX ) 40 MG tablet Take 1 tablet (40 mg total) by mouth daily at 12 noon. 30 tablet 0   rosuvastatin  (CRESTOR ) 20 MG tablet Take 1 tablet (20 mg total) by mouth daily. 30 tablet 0   STUDY -  LIBREXIA-AF - apixaban  5 mg or placebo capsule (PI-Sethi) Take 1 capsule (5 mg total) by mouth 2 (two) times daily. Take at approximately the same time of day with or without food. Please bring bottle back with you to every visit; do not discard bottle. For Investigational Use Only. 280 capsule 0   STUDY - LIBREXIA-AF - [id number] (Milvexian) 100 mg or placebo tablet (PI-Sethi) Take 1 tablet by mouth 2 (two) times daily. Take at approximately the same time of day with or without food. Please bring bottle back with you to every visit; do not discard bottle. For Investigational Use Only. 280 tablet 0   vitamin D3 (CHOLECALCIFEROL ) 25 MCG tablet Take 1 tablet (1,000 Units total) by mouth daily. 30 tablet 0              Home: Home Living Family/patient expects to be discharged to:: Private residence Living Arrangements: Spouse/significant other Available Help at Discharge: Family, Available 24 hours/day (son, Tyrone Gallop to hire 4-5 hrs per day caregiver asisstance) Type of Home: House Home Access: Stairs to enter [employer] of Steps: 3 STE front entry with R ledge vs rail, planning to install rail Entrance Stairs-Rails: Can reach both Home Layout: Multi-level, Able to live on main level with bedroom/bathroom Alternate Level Stairs-Number of Steps: 2 steps down into kitchen/living room with railing. main level has bedroom/bathroom and an upstairs that pt does not have to use Bathroom Shower/Tub: Psychologist, counselling, Door [employer] Toilet: Standard Bathroom Accessibility: Yes Home Equipment: [employer] - single point, Rollator (4 wheels), Rolling Walker (2 wheels), Shower seat, Hand held shower head, Toilet riser, Grab bars - toilet, Grab bars - tub/shower Additional Comments: has SPC  Lives With: Spouse   Functional History: Prior Function Prior Level of Function : Needs assist Mobility Comments: using RW at home from recent discharge from [facility] 4/23 ADLs Comments: Ind with ADLs, toileting,  wife helps with sock/shoes, sponge bathing   Functional Status:  Mobility: Bed Mobility Overal bed mobility: Needs Assistance Bed Mobility: Supine to Sit, Sit to Supine Supine to sit: Contact guard, Used rails, HOB elevated Sit to supine: Contact guard assist, Used rails General bed mobility comments: increased time and effort Transfers Overall transfer level: Needs assistance Equipment used: Rolling walker (2 wheels) Transfers: Sit to/from Stand Sit to Stand: From elevated surface, Mod assist Bed to/from chair/wheelchair/BSC transfer type:: Step pivot Step pivot transfers: Min assist, +2 physical assistance General transfer comment: increased time and multi attempts to power up from elevated bed and 3 in 1 over toilet Ambulation/Gait Ambulation/Gait assistance: Min assist Gait Distance (Feet): 25 Feet (x 2)  Assistive device: Rolling walker (2 wheels) Gait Pattern/deviations: Trunk flexed, Antalgic, Decreased stride length, Step-to pattern General Gait Details: leading with LLE, cues to look up, seated rest break between gait trials Gait velocity: decreased Gait velocity interpretation: <1.31 ft/sec, indicative of household ambulator   ADL: ADL Overall ADL's : Needs assistance/impaired Eating/Feeding: Independent Grooming: Wash/dry hands, Wash/dry face, Contact guard assist, Standing Upper Body Bathing: Contact guard assist, Sitting Lower Body Bathing: Moderate assistance, Sitting/lateral leans Upper Body Dressing : Contact guard assist, Sitting Lower Body Dressing: Total assistance Lower Body Dressing Details (indicate cue type and reason): assisted for socks Toilet Transfer: Moderate assistance, Ambulation, Rolling walker (2 wheels), Regular Toilet, BSC/3in1, Grab bars Toileting- Clothing Manipulation and Hygiene: Maximal assistance, Sit to/from stand Functional mobility during ADLs: Moderate assistance, Rolling walker (2 wheels) General ADL Comments: focused on mobility and  transfers   Cognition: Cognition Orientation Level: Oriented X4 Cognition Arousal: Alert Behavior During Therapy: WFL for tasks assessed/performed   Physical Exam: Blood pressure (!) 157/70, pulse 67, temperature (!) 97.3 F (36.3 C), temperature source Oral, resp. rate 18, height 6' (1.829 m), weight 101.6 kg, SpO2 97%. Physical Exam Constitutional:      General: He is not in acute distress. HENT:     Head: Normocephalic and atraumatic.     Right Ear: External ear normal.     Left Ear: External ear normal.     Nose: Nose normal.     Mouth/Throat:     Mouth: Mucous membranes are moist.  Eyes:     Conjunctiva/sclera: Conjunctivae normal.     Pupils: Pupils are equal, round, and reactive to light.  Cardiovascular:     Rate and Rhythm: Normal rate and regular rhythm.     Heart sounds: No murmur heard.    No gallop.  Pulmonary:     Effort: Pulmonary effort is normal. No respiratory distress.     Breath sounds: No wheezing or rales.  Abdominal:     General: Bowel sounds are normal. There is no distension.     Palpations: Abdomen is soft.     Tenderness: There is no abdominal tenderness.  Musculoskeletal:     Cervical back: Normal range of motion.     Comments: Pt's left leg is tender to palpation. Only trace edema bilateral LE's.   Skin:    Comments: Pt with 2 incisions along left calf with minimal serous drainage. Healing wounds/scabs on 1st MT and great toe of left foot. Right achilles area with linear wound with minimal serous drainage/granulation, measuring about 4cm x 1cm in area, almost closed.   Neurological:     Mental Status: He is alert.     Comments: Alert and oriented x 3. Normal insight and awareness. Intact Memory. Normal language and speech. Cranial nerve exam unremarkable. MMT: BUE grossly 4/5 prox to distal. BLE 3+HF and KE and 4- ADF/PF. Mild sensory loss distally in both legs but otherwise sensory exam appears intact. DTR's 1+. No abnl resting tone.     Psychiatric:        Mood and Affect: Mood normal.        Behavior: Behavior normal.        Thought Content: Thought content normal.        Lab Results Last 48 Hours        Results for orders placed or performed during the hospital encounter of 07/15/23 (from the past 48 hours)  Glucose, capillary     Status: Abnormal    Collection Time:  07/25/23  7:44 AM  Result Value Ref Range    Glucose-Capillary 143 (H) 70 - 99 mg/dL      Comment: Glucose reference range applies only to samples taken after fasting for at least 8 hours.  Glucose, capillary     Status: Abnormal    Collection Time: 07/25/23 12:20 PM  Result Value Ref Range    Glucose-Capillary 112 (H) 70 - 99 mg/dL      Comment: Glucose reference range applies only to samples taken after fasting for at least 8 hours.  Glucose, capillary     Status: Abnormal    Collection Time: 07/25/23  5:16 PM  Result Value Ref Range    Glucose-Capillary 115 (H) 70 - 99 mg/dL      Comment: Glucose reference range applies only to samples taken after fasting for at least 8 hours.  Glucose, capillary     Status: Abnormal    Collection Time: 07/25/23  9:11 PM  Result Value Ref Range    Glucose-Capillary 172 (H) 70 - 99 mg/dL      Comment: Glucose reference range applies only to samples taken after fasting for at least 8 hours.  Glucose, capillary     Status: Abnormal    Collection Time: 07/26/23  8:07 AM  Result Value Ref Range    Glucose-Capillary 145 (H) 70 - 99 mg/dL      Comment: Glucose reference range applies only to samples taken after fasting for at least 8 hours.  Glucose, capillary     Status: Abnormal    Collection Time: 07/26/23 12:19 PM  Result Value Ref Range    Glucose-Capillary 116 (H) 70 - 99 mg/dL      Comment: Glucose reference range applies only to samples taken after fasting for at least 8 hours.  Glucose, capillary     Status: Abnormal    Collection Time: 07/26/23  5:43 PM  Result Value Ref Range    Glucose-Capillary  152 (H) 70 - 99 mg/dL      Comment: Glucose reference range applies only to samples taken after fasting for at least 8 hours.  Glucose, capillary     Status: Abnormal    Collection Time: 07/26/23  8:51 PM  Result Value Ref Range    Glucose-Capillary 121 (H) 70 - 99 mg/dL      Comment: Glucose reference range applies only to samples taken after fasting for at least 8 hours.      Imaging Results (Last 48 hours)  No results found.         Blood pressure (!) 157/70, pulse 67, temperature (!) 97.3 F (36.3 C), temperature source Oral, resp. rate 18, height 6' (1.829 m), weight 101.6 kg, SpO2 97%.   Medical Problem List and Plan: 1. Functional deficits secondary to sepsis secondary to cellulitis.  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  Plan follow-up Dopplers 4 to 6 weeks.             -patient may  shower if left leg is covered             -ELOS/Goals: 7-10 days, supervision to mod I with PT and sup/min with OT 2.  Antithrombotics: -DVT/anticoagulation:  Mechanical: Antiembolism stockings, thigh (TED hose) Bilateral lower extremities             -antiplatelet therapy: Plavix  75 mg daily 3. Pain Management: Oxycodone  as needed             -  pain seems fairly controlled 4. Mood/Behavior/Sleep: Provide emotional support             -antipsychotic agents: N/A 5. Neuropsych/cognition: This patient is capable of making decisions on his own behalf. 6. Skin/Wound Care: Wounds on both legs appear to be healing nicely.              -continue dry dressings with kerlix to left calf and foot as well as right achilles area 7. Fluids/Electrolytes/Nutrition: Routine in and outs with follow-up chemistries 8.  ID/cellulitis..  Completing course of Augmentin /doxycycline  9.  AKI.  Resolved.  Follow-up chemistries 10.  Diabetes mellitus.  Hemoglobin A1c 6.4.  SSI 11.  Hypertension.  Norvasc 2.5 mg daily.  Monitor with increased mobility              -bp with some fluctuation recently 12.  History of prostate cancer.  Proscar  5 mg daily 13.  Atrial fibrillation.  Cardiac rate controlled.  Continue Librexia trial drug per cardiology services as well as Toprol -XL 12.5 mg every 12 hours 14.  Hyperlipidemia.  Crestor /Zetia      Sterling Eisenmenger, PA-C 07/27/2023  I have personally performed a face to face diagnostic evaluation of this patient and formulated the key components of the plan.  Additionally, I have personally reviewed laboratory data, imaging studies, as well as relevant notes and concur with the physician assistant's documentation above.  The patient's status has not changed from the original H&P.  Any changes in documentation from the acute care chart have been noted above.  Rawland Caddy, MD, [place]

## 2023-07-27 NOTE — Consult Note (Addendum)
 WOC Nurse wound follow up; see original consult note 5/2; reconsulted for updated wound care  Wound type: 1.  Full thickness B lower legs r/t venous insufficiency  2.  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 Apply Xeroform gauze ([id number] [id number]) to wound beds daily and cover with silicone foam or dry gauze and Kerlix roll gauze whichever is preferred.  Cleanse L posterior leg wound with Vashe, apply Medihoney to wound bed daily, cover with dry gauze and silicone foam or ABD pad and Kerlix roll gauze whichever preferred.  Hyperkeratotic skin noted to B lower legs, will order Eucerin.    POC discussed with bedside nurse. WOC team will not follow. Re-consult if further needs arise.   Thank you,    Ronni Colace MSN, RN-BC, [employer] [phone]

## 2023-07-27 NOTE — Plan of Care (Signed)

## 2023-07-27 NOTE — Discharge Summary (Signed)
 Physician Discharge Summary  Logan French UJW:[id number] DOB: 1935-01-29 DOA: 07/15/2023  PCP: [name], MD  Admit date: 07/15/2023 Discharge date: 07/27/2023  Admitted From: Home Disposition: Inpatient rehab   Recommendations for Outpatient Follow-up:  Follow up with PCP in 1-2 weeks Please obtain BMP/CBC in one week   Home Health:no Equipment/Devices:None  Discharge Condition:Stable CODE STATUS:Full Diet recommendation: Heart Healthy  Brief/Interim Summary: 89 y.o. male past medical history significant for atrial fibrillation status post cardioversion in 2023, hyperlipidemia pernicious anemia, prostate cancer peripheral vascular disease status post angioplasty on March 2025, recently treated for left lower extremity cellulitis for which she completed treatment was discharged to rehab on 07/07/2023 comes back for left foot infection with ulceration and encephalopathic   Discharge Diagnoses:  Principal Problem:   Sepsis due to undetermined organism [facility]) Active Problems:   Cellulitis   Peripheral arterial disease (HCC)   Atrial fibrillation, chronic (HCC)   Dyslipidemia   BPH (benign prostatic hyperplasia)   HTN (hypertension)   DM (diabetes mellitus) (HCC)  Sepsis due to undetermined organism secondary to cellulitis: Imaging was done that showed no abscess. Vascular surgery was consulted and performed angiogram 07/20/2023 and he status post intra-arterial lithotripsy above the left popliteal artery. He will need follow-up Dopplers as an outpatient in 4 to 6 weeks. His antibiotics were transitioned to oral Augmentin  and doxycycline  which should continue for an additional 7 days and outpatient. Physical therapy evaluated the patient and recommended inpatient rehab. Vascular surgery stressed to him the importance of activity nutrition and daily dressing changes.  Peripheral arterial disease: Continue Plavix . He is on a study [place].  Diabetes mellitus type 2  with hyperglycemia: With an A1c of 6.4 no changes made to his medication.  Essential hypertension: He will continue Norvasc and metoprolol  blood pressure is fairly controlled.  BPH: No change made to his medication.  Dyslipidemia: Slightly change made to his medication.  Chronic atrial fibrillation: Continue metoprolol  and study drug Libraxia.  Discharge Instructions  Discharge Instructions     Diet - low sodium heart healthy   Complete by: As directed    Discharge wound care:   Complete by: As directed    Per wound care instructions   Increase activity slowly   Complete by: As directed       Allergies as of 07/27/2023   No Known Allergies      Medication List     TAKE these medications    acetaminophen  325 MG tablet Commonly known as: Tylenol  Take 2 tablets (650 mg total) by mouth every 6 (six) hours as needed. What changed: reasons to take this   amLODipine 5 MG tablet Commonly known as: NORVASC Take 5 mg by mouth daily.   amoxicillin -clavulanate 875-125 MG tablet Commonly known as: AUGMENTIN  Take 1 tablet by mouth every 12 (twelve) hours for 6 days.   clopidogrel  75 MG tablet Commonly known as: PLAVIX  Take 1 tablet (75 mg total) by mouth daily.   colchicine  0.6 MG tablet Take 1 tablet (0.6 mg total) by mouth daily.   cyanocobalamin  1000 MCG tablet Commonly known as: VITAMIN B12 Take 0.5 tablets (500 mcg total) by mouth daily.   diclofenac  Sodium 1 % Gel Commonly known as: VOLTAREN  Apply 2 g topically 4 (four) times daily.   doxycycline  100 MG tablet Commonly known as: VIBRA -TABS Take 1 tablet (100 mg total) by mouth every 12 (twelve) hours for 6 days.   ezetimibe  10 MG tablet Commonly known as: ZETIA  Take 1 tablet (  10 mg total) by mouth daily.   finasteride  5 MG tablet Commonly known as: PROSCAR  Take 1 tablet (5 mg total) by mouth daily.   furosemide  20 MG tablet Commonly known as: LASIX  Take 20 mg by mouth daily.    hydrochlorothiazide  12.5 MG tablet Commonly known as: HYDRODIURIL  Take 1 tablet (12.5 mg total) by mouth daily.   leptospermum manuka honey Pste paste Apply 1 Application topically daily.   LIBREXIA-AF apixaban  or placebo 5 mg Caps capsule Take 1 capsule (5 mg total) by mouth 2 (two) times daily. Take at approximately the same time of day with or without food. Please bring bottle back with you to every visit; do not discard bottle. For Investigational Use Only.   LIBREXIA-AF [id number] (Milvexian) or placebo 100 mg tablet Take 1 tablet by mouth 2 (two) times daily. Take at approximately the same time of day with or without food. Please bring bottle back with you to every visit; do not discard bottle. For Investigational Use Only.   metFORMIN  500 MG tablet Commonly known as: GLUCOPHAGE  Take 1 tablet (500 mg total) by mouth daily with breakfast.   metoprolol  succinate 25 MG 24 hr tablet Commonly known as: TOPROL -XL Take 1 tablet (25 mg total) by mouth every 12 (twelve) hours.   oxyCODONE  5 MG immediate release tablet Commonly known as: Oxy IR/ROXICODONE  Take 1 tablet (5 mg total) by mouth every 8 (eight) hours as needed for severe pain (pain score 7-10) or breakthrough pain.   pantoprazole  40 MG tablet Commonly known as: PROTONIX  Take 1 tablet (40 mg total) by mouth daily at 12 noon.   rosuvastatin  20 MG tablet Commonly known as: CRESTOR  Take 1 tablet (20 mg total) by mouth daily.   vitamin D3 25 MCG tablet Commonly known as: CHOLECALCIFEROL  Take 1 tablet (1,000 Units total) by mouth daily.               Discharge Care Instructions  (From admission, onward)           Start     Ordered   07/27/23 0000  Discharge wound care:       Comments: Per wound care instructions   07/27/23 1034            Follow-up Information     [name], MD Follow up.   Specialty: Internal Medicine Contact information: [address] [place]  [address] [phone]         [facility] Follow up in 6 week(s).   Specialty: Vascular Surgery Contact information: [address] [place]  [address] [phone]               No Known Allergies  Consultations: Vascular surgery   Procedures/Studies: PERIPHERAL VASCULAR CATHETERIZATION Result Date: 07/20/2023 Images from the original result were not included. Patient name: Logan French MRN: [id number] DOB: 03-Apr-1934 Sex: male 07/20/2023 Pre-operative Diagnosis: Left leg ulcer Post-operative diagnosis:  Same Surgeon:  Gareld June Procedure Performed:  1.  Ultrasound-guided access, right femoral artery  2.  Abdominal aortogram  3.  Selective injection with catheter left SFA  4.  Shockwave intra-arterial lithotripsy left popliteal artery  5.  Conscious sedation, 50 minutes  6.  Closure device, Celt Indications: This is a an 89 year old gentleman with recent intervention of the right leg who now has wounds on the left.  He comes in today for further evaluation and possible  intervention Procedure:  The patient was identified in the holding area and taken to room 8.  The patient was then placed supine on the table and prepped and draped in the usual sterile fashion.  A time out was called.  Conscious sedation was administered with the use of IV fentanyl  and Versed  under continuous physician and nurse monitoring.  Heart rate, blood pressure, and oxygen saturation were continuously monitored.  Total sedation time was 50 minutes.  Ultrasound was used to evaluate the right common femoral artery.  It was patent .  A digital ultrasound image was acquired.  A micropuncture needle was used to access the right common femoral artery under ultrasound guidance.  An 018 wire was advanced without resistance and a micropuncture sheath was placed.  The 018 wire was removed and a benson wire was placed.  The micropuncture  sheath was exchanged for a 5 french sheath.  An omniflush catheter was advanced over the wire to the level of L-1.  An abdominal angiogram was obtained.  Next, using the omniflush catheter and a benson wire, the aortic bifurcation was crossed and the catheter was placed into theleft external iliac artery and left runoff was obtained. Findings:  Aortogram: No significant renal artery stenosis was visualized.  The infrarenal abdominal aorta is widely patent without significant stenosis.  Bilateral common and external iliac arteries are widely patent.  Left Lower Extremity: The left common femoral and profundofemoral artery are calcified but patent without significant stenosis.  The superficial femoral artery is patent without significant stenosis.  The popliteal artery at the level of the patella has luminal narrowing of approximately 60% which is circumferentially calcified.  The below-knee popliteal artery is widely patent with single-vessel runoff via the peroneal artery which does reconstitute a posterior tibial at the ankle providing perfusion to the foot Intervention: After the above images were acquired the decision was made to proceed with intervention.  A 6 French 45 cm sheath was advanced into the left superficial femoral artery.  Selective injections were placed with a catheter at this level.  I then fully heparinized the patient.  An 014 [place] core wire was advanced across the lesion in the above-knee popliteal artery.  I selected a 5 x 80 shockwave intra-arterial lithotripsy balloon and performed balloon angioplasty at this level.  A total of 280 pulses were administered.  Completion imaging showed no residual stenosis with preserved runoff via the peroneal artery.  I was satisfied with these results.  The long sheath was exchanged out for a short 6 French sheath and a Celt was used for closure.  There were no complications. Impression:  #1  Successful shockwave intra-arterial lithotripsy of a 60 to 70%  left above-knee popliteal artery stenosis  #2  Single-vessel runoff to the left leg via the peroneal artery which reconstitutes the posterior tibial at the ankle V. Gareld June, M.D., FACS [facility] Office: [phone] Pager:  [phone]   DG Foot Complete Left Result Date: 07/15/2023 CLINICAL DATA:  Ulcer, fever, altered mental status, foot wound EXAM: LEFT FOOT - COMPLETE 3+ VIEW COMPARISON:  None FINDINGS: Lateral scalloping of the head of the first metatarsal with adjacent mild sclerosis, this has a chronic appearance and could be related to gout arthropathy. 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 No malalignment at the Lisfranc joint. No fracture identified. No current bony destructive findings characteristic of osteomyelitis. Dorsal swelling of the forefoot, cellulitis is not excluded. Plantar calcaneal spur.  Calcifications in the Achilles tendon along with a potential ossification along the myotendinous junction of the Achilles shown on the lateral projection. Atheromatous vascular calcifications. No well-defined gas tracking in the soft tissues. IMPRESSION: 1. Dorsal swelling of the forefoot, cellulitis is not excluded. 2. No current bony destructive findings characteristic of osteomyelitis. 3. Lateral scalloping of the head of the first metatarsal with adjacent mild sclerosis, this has a chronic appearance and could be related to gout arthropathy. 4. 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 5. Plantar calcaneal spur. 6. Calcifications in the Achilles tendon along with a potential ossification along the myotendinous junction of the Achilles shown on the lateral projection. 7. Atheromatous vascular calcifications. Electronically Signed   By: Freida Jes M.D.   On: 07/15/2023 19:46   DG Chest Port 1 View Result Date: 07/15/2023 CLINICAL DATA:  Sepsis, fever, altered  mental status EXAM: PORTABLE CHEST 1 VIEW COMPARISON:  06/22/2023 FINDINGS: Moderate enlargement of the cardiopericardial silhouette. No findings of pulmonary edema. No blunting of the costophrenic angles. The lungs appear clear. Low lung volumes. Degenerative AC joint spurring on the right. IMPRESSION: 1. Moderate enlargement of the cardiopericardial silhouette, without pulmonary edema. 2. Degenerative AC joint spurring on the right. Electronically Signed   By: Freida Jes M.D.   On: 07/15/2023 19:43   Subjective: No complains  Discharge Exam: Vitals:   07/27/23 0743 07/27/23 0903  BP: 105/76 105/76  Pulse:    Resp: 18   Temp: 97.6 F (36.4 C)   SpO2:     Vitals:   07/26/23 2358 07/27/23 0444 07/27/23 0743 07/27/23 0903  BP: (!) 146/64 (!) 157/70 105/76 105/76  Pulse: 64 67    Resp: 18 18 18    Temp: 98 F (36.7 C) (!) 97.3 F (36.3 C) 97.6 F (36.4 C)   TempSrc: Oral Oral Axillary   SpO2: 97% 97%    Weight:      Height:        General: Pt is alert, awake, not in acute distress Cardiovascular: RRR, S1/S2 +, no rubs, no gallops Respiratory: CTA bilaterally, no wheezing, no rhonchi Abdominal: Soft, NT, ND, bowel sounds + Extremities: no edema, no cyanosis    The results of significant diagnostics from this hospitalization (including imaging, microbiology, ancillary and laboratory) are listed below for reference.     Microbiology: No results found for this or any previous visit (from the past 240 hours).   Labs: BNP (last 3 results) Recent Labs    06/21/23 0431 06/22/23 0433 06/23/23 0431  BNP 546.8* 469.2* 621.9*   Basic Metabolic Panel: Recent Labs  Lab 07/21/23 0412 07/23/23 0426 07/25/23 0415  NA 136 135 134*  K 4.1 3.8 4.1  CL 103 102 101  CO2 26 24 26   GLUCOSE 117* 122* 114*  BUN 12 12 11   CREATININE 0.83 0.96 0.80  CALCIUM  8.2* 8.1* 8.2*   Liver Function Tests: No results for input(s): "AST", "ALT", "ALKPHOS", "BILITOT", "PROT",  "ALBUMIN" in the last 168 hours. No results for input(s): "LIPASE", "AMYLASE" in the last 168 hours. No results for input(s): "AMMONIA" in the last 168 hours. CBC: Recent Labs  Lab 07/21/23 0412 07/23/23 0426 07/25/23 0415  WBC 10.5 10.6* 11.6*  HGB 11.7* 12.4* 11.2*  HCT 36.5* 37.6* 34.9*  MCV 86.5 85.6 85.3  PLT 238 318 389   Cardiac Enzymes: No results for input(s): "CKTOTAL", "CKMB", "CKMBINDEX", "TROPONINI" in the last 168 hours. BNP: Invalid input(s): "POCBNP" CBG: Recent Labs  Lab 07/26/23 0807 07/26/23 1219 07/26/23 1743 07/26/23 2051 07/27/23 0751  GLUCAP 145* 116* 152* 121* 136*   D-Dimer No results for input(s): "DDIMER" in the last 72 hours. Hgb A1c No results for input(s): "HGBA1C" in the last 72 hours. Lipid Profile No results for input(s): "CHOL", "HDL", "LDLCALC", "TRIG", "CHOLHDL", "LDLDIRECT" in the last 72 hours. Thyroid  function studies No results for input(s): "TSH", "T4TOTAL", "T3FREE", "THYROIDAB" in the last 72 hours.  Invalid input(s): "FREET3" Anemia work up No results for input(s): "VITAMINB12", "FOLATE", "FERRITIN", "TIBC", "IRON", "RETICCTPCT" in the last 72 hours. Urinalysis    Component Value Date/Time   COLORURINE YELLOW 07/15/2023 1927   APPEARANCEUR CLEAR 07/15/2023 1927   LABSPEC 1.021 07/15/2023 1927   PHURINE 5.0 07/15/2023 1927   GLUCOSEU NEGATIVE 07/15/2023 1927   HGBUR SMALL (A) 07/15/2023 1927   BILIRUBINUR NEGATIVE 07/15/2023 1927   KETONESUR NEGATIVE 07/15/2023 1927   PROTEINUR 30 (A) 07/15/2023 1927   NITRITE NEGATIVE 07/15/2023 1927   LEUKOCYTESUR NEGATIVE 07/15/2023 1927   Sepsis Labs Recent Labs  Lab 07/21/23 0412 07/23/23 0426 07/25/23 0415  WBC 10.5 10.6* 11.6*   Microbiology No results found for this or any previous visit (from the past 240 hours).   Time coordinating discharge: Over 35 minutes  SIGNED:   Macdonald Savoy, MD  [facility] 07/27/2023, 10:34 AM Pager   If 7PM-7AM,  please contact night-coverage [email or web address] Password [email or web address]

## 2023-07-27 NOTE — Discharge Instructions (Addendum)
 Inpatient Rehab Discharge Instructions  Jep Dyas Discharge date and time: No discharge date for patient encounter.   Activities/Precautions/ Functional Status: Activity: As tolerated Diet: Regular Wound Care: Routine skin checks Functional status:  ___ No restrictions     ___ Walk up steps independently ___ 24/7 supervision/assistance   ___ Walk up steps with assistance ___ Intermittent supervision/assistance  ___ Bathe/dress independently ___ Walk with walker     _x__ Bathe/dress with assistance ___ Walk Independently    ___ Shower independently ___ Walk with assistance    ___ Shower with assistance ___ No alcohol      ___ Return to work/school ________  Special Instructions: No driving smoking or alcohol   Patient may shower and wash legs with soap and water and pat dry.  Apply Eucerin to lower legs to moisturize intact skin.  Cleanse right lower leg and left foot wounds with Vashe wound cleanser, do not rinse and allow to air dry.  Apply Xeroform gauze to wound beds daily and cover with silicone foam or dry gauze and Kerlex roll gauze whichever preferred.  Cleanse left posterior leg with Vashe, apply Santyl to wound bed daily cover with normal saline moist gauze, then dry gauze and silicone foam or ABD pad and Kerlex roll gauze whichever preferred.   COMMUNITY REFERRALS UPON DISCHARGE:   Home health: [facility]   PT   OT   RN PHONE: [phone] WILL CALL TO SET UP APPOINTMENTS  NO EQUIPMENT NEEDS-HAS FROM PREVIOUS ADMISSIONS    My questions have been answered and I understand these instructions. I will adhere to these goals and the provided educational materials after my discharge from the hospital.  Patient/Caregiver Signature _______________________________ Date __________  Clinician Signature _______________________________________ Date __________  Please bring this form and your medication list with you to all your follow-up doctor's appointments.

## 2023-07-27 NOTE — Plan of Care (Signed)
 Wound Plan   Braden Score: 17 Sensory: 3 Moisture: 3 Activity: 3 Mobility: 3 Nutrition: 3 Friction: 2  Wounds present:  WOC Nurse Consult Note: patient followed at [facility] for R leg wound r/t venous insufficiency; has been admitted for cellulitis of this leg; last visit at [facility] patient switched to Bactroban  ointment and  Prisma (a collagen dressing not on formulary at [facility]); will use Aquacel AG as substitute  Reason for Consult: R lower leg wound  Wound type: full thickness r/t venous insufficiency  Pressure Injury POA: NA  Measurement: see nursing flowsheet  Wound bed:75% tan yellow 25% pink  Drainage (amount, consistency, odor) see nursing flowsheet  Periwound: edema, erythema, hyperkeratotic skin    Interventions:  Dressing procedure/placement/frequency: Cleanse R lower leg (intact skin and wound) with Vashe wound cleanser, do not rinse and allow to air dry.   Apply Bactroban  to wound bed daily, cover with silver hydrofiber Timm Foot [phone]) cut to fit wound bed,  cover with dry gauze/ABD pad.   Secure dressing with Kerlix roll gauze beginning right above toes and ending right below knee.   Apply Ace bandage wrapped in same fashion as Kerlix for light compression.  Patient should elevate leg as much as possible.  Vitamin D  daily, Vitamin B12 daily HH diet; eating 75-100% of meals  Contributors: Bullins, Saverio Curling, RN Registered Nurse WOC Forrestine Ike, RN-BC, CRRN

## 2023-07-27 NOTE — Plan of Care (Signed)
 Wound Plan   Braden Score: 17 Sensory: 3 Moisture: 3 Activity: 3 Mobility: 3 Nutrition: 3 Friction: 2  Wounds present:  WOC Nurse Consult Note: patient followed at [facility] for R leg wound r/t venous insufficiency; has been admitted for cellulitis of this leg; last visit at [facility] patient switched to Bactroban  ointment and  Prisma (a collagen dressing not on formulary at [facility]); will use Aquacel AG as substitute  Reason for Consult: R lower leg wound  Wound type: full thickness r/t venous insufficiency  Pressure Injury POA: NA  Measurement: see nursing flowsheet  Wound bed:75% tan yellow 25% pink  Drainage (amount, consistency, odor) see nursing flowsheet  Periwound: edema, erythema, hyperkeratotic skin    Interventions:  Dressing procedure/placement/frequency: Cleanse R lower leg (intact skin and wound) with Vashe wound cleanser, do not rinse and allow to air dry.   Apply Bactroban  to wound bed daily, cover with silver hydrofiber Timm Foot [phone]) cut to fit wound bed,  cover with dry gauze/ABD pad.   Secure dressing with Kerlix roll gauze beginning right above toes and ending right below knee.   Apply Ace bandage wrapped in same fashion as Kerlix for light compression.  Patient should elevate leg as much as possible.  Vitamin D  daily, Vitamin B12 daily HH diet; eating 75-100% of meals Redistribution cushion for wheelchair PRAFO for right foot  Contributors: Bullins, Saverio Curling, RN Registered Nurse WOC Forrestine Ike, RN-BC, CRRN

## 2023-07-27 NOTE — Progress Notes (Signed)
 Inpatient Rehabilitation Admissions Coordinator   I have CIR bed to admit him to today. Acute team and TOC made aware. I will make the arrangements.  Jeannetta Millman, RN, MSN Rehab Admissions Coordinator [phone] 07/27/2023 9:47 AM

## 2023-07-28 DIAGNOSIS — N179 Acute kidney failure, unspecified: Secondary | ICD-10-CM | POA: Diagnosis not present

## 2023-07-28 DIAGNOSIS — L0390 Cellulitis, unspecified: Secondary | ICD-10-CM

## 2023-07-28 DIAGNOSIS — I10 Essential (primary) hypertension: Secondary | ICD-10-CM | POA: Diagnosis not present

## 2023-07-28 DIAGNOSIS — E119 Type 2 diabetes mellitus without complications: Secondary | ICD-10-CM | POA: Diagnosis not present

## 2023-07-28 DIAGNOSIS — R7989 Other specified abnormal findings of blood chemistry: Secondary | ICD-10-CM

## 2023-07-28 DIAGNOSIS — Z794 Long term (current) use of insulin: Secondary | ICD-10-CM

## 2023-07-28 LAB — COMPREHENSIVE METABOLIC PANEL WITH GFR
ALT: 107 U/L — ABNORMAL HIGH (ref 0–44)
AST: 106 U/L — ABNORMAL HIGH (ref 15–41)
Albumin: 2.1 g/dL — ABNORMAL LOW (ref 3.5–5.0)
Alkaline Phosphatase: 160 U/L — ABNORMAL HIGH (ref 38–126)
Anion gap: 8 (ref 5–15)
BUN: 10 mg/dL (ref 8–23)
CO2: 24 mmol/L (ref 22–32)
Calcium: 8.5 mg/dL — ABNORMAL LOW (ref 8.9–10.3)
Chloride: 104 mmol/L (ref 98–111)
Creatinine, Ser: 0.89 mg/dL (ref 0.61–1.24)
GFR, Estimated: >60 mL/min (ref >60)
Glucose, Bld: 105 mg/dL — ABNORMAL HIGH (ref 70–99)
Potassium: 4.1 mmol/L (ref 3.5–5.1)
Sodium: 136 mmol/L (ref 135–145)
Total Bilirubin: 0.9 mg/dL (ref 0.0–1.2)
Total Protein: 5.9 g/dL — ABNORMAL LOW (ref 6.5–8.1)

## 2023-07-28 LAB — CBC WITH DIFFERENTIAL/PLATELET
Abs Immature Granulocytes: 0.11 10*3/uL — ABNORMAL HIGH (ref 0.00–0.07)
Basophils Absolute: 0.1 10*3/uL (ref 0.0–0.1)
Basophils Relative: 1 %
Eosinophils Absolute: 0.2 10*3/uL (ref 0.0–0.5)
Eosinophils Relative: 2 %
HCT: 37.6 % — ABNORMAL LOW (ref 39.0–52.0)
Hemoglobin: 11.7 g/dL — ABNORMAL LOW (ref 13.0–17.0)
Immature Granulocytes: 1 %
Lymphocytes Relative: 16 %
Lymphs Abs: 1.5 10*3/uL (ref 0.7–4.0)
MCH: 27.3 pg (ref 26.0–34.0)
MCHC: 31.1 g/dL (ref 30.0–36.0)
MCV: 87.6 fL (ref 80.0–100.0)
Monocytes Absolute: 0.7 10*3/uL (ref 0.1–1.0)
Monocytes Relative: 7 %
Neutro Abs: 6.9 10*3/uL (ref 1.7–7.7)
Neutrophils Relative %: 73 %
Platelets: 478 10*3/uL — ABNORMAL HIGH (ref 150–400)
RBC: 4.29 MIL/uL (ref 4.22–5.81)
RDW: 16.9 % — ABNORMAL HIGH (ref 11.5–15.5)
WBC: 9.4 10*3/uL (ref 4.0–10.5)
nRBC: 0 % (ref 0.0–0.2)

## 2023-07-28 LAB — GLUCOSE, CAPILLARY
Glucose-Capillary: 113 mg/dL — ABNORMAL HIGH (ref 70–99)
Glucose-Capillary: 126 mg/dL — ABNORMAL HIGH (ref 70–99)
Glucose-Capillary: 107 mg/dL — ABNORMAL HIGH (ref 70–99)
Glucose-Capillary: 168 mg/dL — ABNORMAL HIGH (ref 70–99)

## 2023-07-28 MED ORDER — ACETAMINOPHEN 325 MG PO TABS: 650.0000 mg | ORAL_TABLET | ORAL | Status: DC | PRN

## 2023-07-28 MED ORDER — ACETAMINOPHEN 325 MG PO TABS: 650.0000 mg | ORAL_TABLET | Freq: Four times a day (QID) | ORAL | Status: DC | PRN

## 2023-07-28 NOTE — Plan of Care (Signed)
  Problem: RH Balance Goal: LTG Patient will maintain dynamic standing with ADLs (OT) Description: LTG:  Patient will maintain dynamic standing balance with assist during activities of daily living (OT)  Flowsheets (Taken 07/28/2023 1253) LTG: Pt will maintain dynamic standing balance during ADLs with: Independent with assistive device   Problem: Sit to Stand Goal: LTG:  Patient will perform sit to stand in prep for activites of daily living with assistance level (OT) Description: LTG:  Patient will perform sit to stand in prep for activites of daily living with assistance level (OT) Flowsheets (Taken 07/28/2023 1253) LTG: PT will perform sit to stand in prep for activites of daily living with assistance level: Independent with assistive device   Problem: RH Bathing Goal: LTG Patient will bathe all body parts with assist levels (OT) Description: LTG: Patient will bathe all body parts with assist levels (OT) Flowsheets (Taken 07/28/2023 1253) LTG: Pt will perform bathing with assistance level/cueing: Supervision/Verbal cueing LTG: Position pt will perform bathing:  Shower  At sink   Problem: RH Dressing Goal: LTG Patient will perform lower body dressing w/assist (OT) Description: LTG: Patient will perform lower body dressing with assist, with/without cues in positioning using equipment (OT) Flowsheets (Taken 07/28/2023 1253) LTG: Pt will perform lower body dressing with assistance level of: Minimal Assistance - Patient > 75%   Problem: RH Toileting Goal: LTG Patient will perform toileting task (3/3 steps) with assistance level (OT) Description: LTG: Patient will perform toileting task (3/3 steps) with assistance level (OT)  Flowsheets (Taken 07/28/2023 1253) LTG: Pt will perform toileting task (3/3 steps) with assistance level: Independent with assistive device   Problem: RH Toilet Transfers Goal: LTG Patient will perform toilet transfers w/assist (OT) Description: LTG: Patient will  perform toilet transfers with assist, with/without cues using equipment (OT) Flowsheets (Taken 07/28/2023 1253) LTG: Pt will perform toilet transfers with assistance level of: Independent with assistive device   Problem: RH Tub/Shower Transfers Goal: LTG Patient will perform tub/shower transfers w/assist (OT) Description: LTG: Patient will perform tub/shower transfers with assist, with/without cues using equipment (OT) Flowsheets (Taken 07/28/2023 1253) LTG: Pt will perform tub/shower stall transfers with assistance level of: Supervision/Verbal cueing LTG: Pt will perform tub/shower transfers from: Walk in shower

## 2023-07-28 NOTE — Progress Notes (Signed)
 PROGRESS NOTE   Subjective/Complaints: Patient reports no pain, says he has moderate discomfort controlled with current medications.  Denies any difficulty emptying his bladder.  Reports had a bowel movement yesterday.  ROS: Patient denies fever, new vision changes, dizziness, nausea, vomiting, diarrhea,  shortness of breath or chest pain, headache, or mood change.    Objective:   No results found. Recent Labs    07/28/23 0520  WBC 9.4  HGB 11.7*  HCT 37.6*  PLT 478*   Recent Labs    07/28/23 0520  NA 136  K 4.1  CL 104  CO2 24  GLUCOSE 105*  BUN 10  CREATININE 0.89  CALCIUM  8.5*    Intake/Output Summary (Last 24 hours) at 07/28/2023 1145 Last data filed at 07/28/2023 0744 Gross per 24 hour  Intake 480 ml  Output 1525 ml  Net -1045 ml        Physical Exam: Vital Signs Blood pressure (!) 148/52, pulse 62, temperature 97.8 F (36.6 C), temperature source Oral, resp. rate 17, height 6' (1.829 m), weight 93.7 kg, SpO2 97%.   General: Shaving with OT HEENT: Head is normocephalic, atraumatic, PERRLA, EOMI, sclera anicteric, oral mucosa pink and moist Neck: Supple without JVD or lymphadenopathy Heart: Reg rate and rhythm. Chest: CTA bilaterally without wheezes, rales, or rhonchi; no distress Abdomen: Soft, non-tender, non-distended, bowel sounds positive. Psych: Pt's affect is appropriate. Pt is cooperative Skin: Pt with 2 incisions along left calf with minimal serous drainage. Healing wounds/scabs on 1st MT and great toe of left foot. Right achilles area with linear wound with minimal serous drainage/granulation, measuring about 4cm x 1cm in area, almost closed.   MSK:    Cervical back: Normal range of motion.     Comments: Pt's left leg is tender to palpation. Only trace edema bilateral LE's.    Neuro:     Comments: Alert and oriented x 3. Normal insight and awareness. Intact Memory. Normal language and  speech. Cranial nerve exam unremarkable. MMT: BUE grossly 4/5 prox to distal. BLE 3+HF and KE and 4- ADF/PF. Mild sensory loss distally in both legs but otherwise sensory exam appears intact. DTR's 1+. No abnl resting tone.    Exam stable 5/14     Assessment/Plan: 1. Functional deficits which require 3+ hours per day of interdisciplinary therapy in a comprehensive inpatient rehab setting. Physiatrist is providing close team supervision and 24 hour management of active medical problems listed below. Physiatrist and rehab team continue to assess barriers to discharge/monitor patient progress toward functional and medical goals  Care Tool:  Bathing              Bathing assist       Upper Body Dressing/Undressing Upper body dressing        Upper body assist      Lower Body Dressing/Undressing Lower body dressing            Lower body assist       Toileting Toileting    Toileting assist Assist for toileting: Moderate Assistance - Patient 50 - 74%     Transfers Chair/bed transfer  Transfers assist  Chair/bed transfer activity did not occur: Safety/medical  concerns  Chair/bed transfer assist level: Minimal Assistance - Patient > 75% Chair/bed transfer assistive device: Arboriculturist assist      Assist level: Minimal Assistance - Patient > 75% Assistive device: Walker-rolling Max distance: 35ft   Walk 10 feet activity   Assist     Assist level: Minimal Assistance - Patient > 75% Assistive device: Walker-rolling   Walk 50 feet activity   Assist    Assist level: Minimal Assistance - Patient > 75% Assistive device: Walker-rolling    Walk 150 feet activity   Assist Walk 150 feet activity did not occur: Safety/medical concerns (fatigue, discomofort in LLE)         Walk 10 feet on uneven surface  activity   Assist     Assist level: Minimal Assistance - Patient > 75% Assistive device: Administrator, [employer]     Assist Is the patient using a wheelchair?: Yes      Wheelchair assist level: Supervision/Verbal cueing Max wheelchair distance: 70ft    Wheelchair 50 feet with 2 turns activity    Assist        Assist Level: Supervision/Verbal cueing   Wheelchair 150 feet activity     Assist      Assist Level: Moderate Assistance - Patient 50 - 74%   Blood pressure (!) 148/52, pulse 62, temperature 97.8 F (36.6 C), temperature source Oral, resp. rate 17, height 6' (1.829 m), weight 93.7 kg, SpO2 97%.  Medical Problem List and Plan: 1. Functional deficits secondary to sepsis secondary to cellulitis.  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  Plan follow-up Dopplers 4 to 6 weeks.             -patient may  shower if left leg is covered             -ELOS/Goals: 7-10 days, supervision to mod I with PT and sup/min with OT  -Team conference today please see physician documentation under team conference tab, met with team  to discuss problems,progress, and goals. Formulized individual treatment plan based on medical history, underlying problem and comorbidities.   -Team conference today please see physician documentation under team conference tab, met with team  to discuss problems,progress, and goals. Formulized individual treatment plan based on medical history, underlying problem and comorbidities.    2.  Antithrombotics: -DVT/anticoagulation:  Mechanical: Antiembolism stockings, thigh (TED hose) Bilateral lower extremities             -antiplatelet therapy: Plavix  75 mg daily 3. Pain Management: Oxycodone  as needed             -pain seems fairly controlled 4. Mood/Behavior/Sleep: Provide emotional support             -antipsychotic agents: N/A 5. Neuropsych/cognition: This patient is capable of making decisions on his own behalf. 6. Skin/Wound Care: Wounds on both legs appear to be healing nicely.               -continue dry dressings with kerlix to left calf and foot as well as right achilles area  -5/14 seen by [facility]- for leg wounds 7. Fluids/Electrolytes/Nutrition: Routine in and outs with follow-up chemistries 8.  ID/cellulitis..  Completing course of Augmentin /doxycycline  9.  AKI.  Resolved.  Follow-up chemistries  - 5/14 BUN/creatinine stable 10.  Diabetes mellitus.  Hemoglobin A1c 6.4.  SSI  - CBGs stable, continue to monitor CBG (last 3)  Recent Labs  07/27/23 2043 07/28/23 0539 07/28/23 1147  GLUCAP 157* 113* 126*    11.  Hypertension.  Norvasc 2.5 mg daily.  Monitor with increased mobility             -bp with some fluctuation recently  - 5/14 continue to monitor trend    07/28/2023    1:09 PM 07/28/2023    8:12 AM 07/28/2023    3:12 AM  Vitals with BMI  Systolic 155 148 914  Diastolic 88 52 72  Pulse 56 62 67    12.  History of prostate cancer.  Proscar  5 mg daily 13.  Atrial fibrillation.  Cardiac rate controlled.  Continue Librexia trial drug per cardiology services as well as Toprol -XL 12.5 mg every 12 hours 14.  Hyperlipidemia.  Crestor /Zetia  15.  Elevated LFTs, decrease Tylenol  frequency, continue to monitor  LOS: 1 days A FACE TO FACE EVALUATION WAS PERFORMED  Logan French 07/28/2023, 11:45 AM

## 2023-07-28 NOTE — Patient Care Conference (Signed)
 Inpatient RehabilitationTeam Conference and Plan of Care Update Date: 07/28/2023   Time: 11:40 AM    Patient Name: Logan French      Medical Record Number: [id number]  Date of Birth: Apr 05, 1934 Sex: Male         Room/Bed: 4M05C/4M05C-01 Payor Info: Payor: MEDICARE / Plan: MEDICARE PART A AND B / Product Type: *No Product type* /    Admit Date/Time:  07/27/2023  3:18 PM  Primary Diagnosis:  Cellulitis  Hospital Problems: Principal Problem:   Cellulitis    Expected Discharge Date: Expected Discharge Date: 08/06/23  Team Members Present: Physician leading conference: Dr. Lylia Sand Social Worker Present: Norval Been, LCSW Nurse Present: Forrestine Ike, RN PT Present: Nena Bank, PT OT Present: Celestino Cole, OT PPS Coordinator present : Jestine Moron, SLP     Current Status/Progress Goal Weekly Team Focus  Bowel/Bladder      BPH on proscar , Constipation on medications    Manage bowel and bladder w mod I assist    Assess continence q shift  Swallow/Nutrition/ Hydration               ADL's   Min assist/ set up Upper Body, Mod Assist LB, Mod assist sit to stand   Min assist BADL   LE wound management, endurance, pain    Mobility   bed mobility supervision, transfers with RW min A, gait 50ft with RW min A, WC mobility 57ft supervision   Mod I  barriers: LLE discomfort/edema, generalized weakness/deconditioning, decreased balance/coordination    Communication                Safety/Cognition/ Behavioral Observations               Pain      Minimal pain medication for leg discomfort    Pain < 4 with prns    Assess pain q shift and effectiveness of prn meds  Skin      Chronic wounds on lower extremities and cellulitis   Cellulitis healing, wounds managed per orders    Skin care/wound care per orders    Discharge Planning:  TBA.   Team Discussion: Patient admitted with debility post cellulitis with AKI and history of DM.    Patient on target to meet rehab goals: yes, currently needs min assist for upper body care and mod assist for lower body care. Needs min - mod assist for sit - stand transfers and able to ambulate up to 50' with min assist and manage steps with min assist. Goals for discharge set for min assist for lower body care and supervision - mod I overall.  *See Care Plan and progress notes for long and short-term goals.   Revisions to Treatment Plan:  N/a   Teaching Needs: Safety, skin care/wound care, medications  Current Barriers to Discharge: Decreased caregiver support, Home enviroment access/layout, and Wound care  Possible Resolutions to Barriers: Family education OP follow [facility] at [facility] Summary Current Status: sepsis secondary to cellulitis, wounds, pain, AKI, AFib, HLD, HTN  Barriers to Discharge: Medical stability;Complicated Wound;Self-care education;Renal Insufficiency/Failure;Cardiac Complications  Barriers to Discharge Comments: sepsis secondary to cellulitis, wounds, pain, AKI, AFib, HLD, HTN, DM2 Possible Resolutions to [employer]: monitor wounds, monitor BP, follow CBC/BMP, monitor glucose, pain control   Continued Need for Acute Rehabilitation Level of Care: The patient requires daily medical management by a physician with specialized training in physical medicine and rehabilitation for the following reasons: Direction of  a multidisciplinary physical rehabilitation program to maximize functional independence : Yes Medical management of patient stability for increased activity during participation in an intensive rehabilitation regime.: Yes Analysis of laboratory values and/or radiology reports with any subsequent need for medication adjustment and/or medical intervention. : Yes   I attest that I was present, lead the team conference, and concur with the assessment and plan of the team.   Forrestine Ike B 07/28/2023, 4:14 PM

## 2023-07-28 NOTE — Evaluation (Signed)
 Physical Therapy Assessment and Plan  Patient Details  Name: Logan French MRN: [id number] Date of Birth: 1935/02/02  PT Diagnosis: Abnormal posture, Abnormality of gait, Difficulty walking, Edema, Muscle weakness, and Pain in LLE Rehab Potential: Good ELOS: 7-10 days   Today's Date: 07/28/2023 PT Individual Time: 7829-5621 PT Individual Time Calculation (min): 70 min    Hospital Problem: Principal Problem:   Cellulitis   Past Medical History:  Past Medical History:  Diagnosis Date   A-fib (HCC)    B12 deficiency    Diverticulosis    Fatigue    History of colonic polyps    Hyperlipemia    Hypertension    Pernicious anemia    Prostate cancer (HCC)    Syncope    Syncope    Past Surgical History:  Past Surgical History:  Procedure Laterality Date   ABDOMINAL AORTOGRAM N/A 07/20/2023   Procedure: ABDOMINAL AORTOGRAM;  Surgeon: Margherita Shell, MD;  Location: [facility] INVASIVE CV LAB;  Service: Cardiovascular;  Laterality: N/A;   ABDOMINAL AORTOGRAM W/LOWER EXTREMITY N/A 05/17/2023   Procedure: ABDOMINAL AORTOGRAM W/LOWER EXTREMITY;  Surgeon: Philipp Brawn, MD;  Location: [facility] INVASIVE CV LAB;  Service: Cardiovascular;  Laterality: N/A;   CARDIOVERSION N/A 10/07/2021   Procedure: CARDIOVERSION;  Surgeon: Jerryl Morin, DO;  Location: [facility] ENDOSCOPY;  Service: Cardiovascular;  Laterality: N/A;   IR ANGIO INTRA EXTRACRAN SEL COM CAROTID INNOMINATE BILAT MOD SED  09/10/2017   IR ANGIO VERTEBRAL SEL SUBCLAVIAN INNOMINATE BILAT MOD SED  09/10/2017   LOWER EXTREMITY ANGIOGRAPHY N/A 07/20/2023   Procedure: Lower Extremity Angiography;  Surgeon: Margherita Shell, MD;  Location: [facility] INVASIVE CV LAB;  Service: Cardiovascular;  Laterality: N/A;   LOWER EXTREMITY INTERVENTION Left 07/20/2023   Procedure: LOWER EXTREMITY INTERVENTION;  Surgeon: Margherita Shell, MD;  Location: [facility] INVASIVE CV LAB;  Service: Cardiovascular;  Laterality: Left;   PERIPHERAL INTRAVASCULAR LITHOTRIPSY Right 05/17/2023    Procedure: PERIPHERAL INTRAVASCULAR LITHOTRIPSY;  Surgeon: Philipp Brawn, MD;  Location: [facility] INVASIVE CV LAB;  Service: Cardiovascular;  Laterality: Right;  SFA-POP   PERIPHERAL INTRAVASCULAR LITHOTRIPSY Left 07/20/2023   Procedure: PERIPHERAL INTRAVASCULAR LITHOTRIPSY;  Surgeon: Margherita Shell, MD;  Location: [facility] INVASIVE CV LAB;  Service: Cardiovascular;  Laterality: Left;   PERIPHERAL VASCULAR BALLOON ANGIOPLASTY Right 05/17/2023   Procedure: PERIPHERAL VASCULAR BALLOON ANGIOPLASTY;  Surgeon: Philipp Brawn, MD;  Location: [facility] INVASIVE CV [facility];  Service: Cardiovascular;  Laterality: Right;  SFA-POP    Assessment & Plan Clinical Impression: Patient is a 89 y.o. year old male with history of atrial fibrillation and cardioversion 10/07/2021 per Dr. Jerryl Morin, hyperlipidemia, syncope, hypertension, pernicious anemia, diabetes mellitus, prostate cancer, peripheral vascular disease status post angioplasty of the right popliteal artery 05/17/2023 per Dr. Susi Eric. Patient did receive CIR 06/25/2023 - 07/07/2023 due to sepsis secondary to right lower extremity cellulitis/streptococcal bacteremia and was discharged home ambulating with a rolling walker contact-guard assist for eating to close supervision. Per chart review patient lives with spouse. Two-level home bed and bath main level. Presented 07/15/2023 with increasing lethargy and disorientation and fever 102.6 as well as blistering of the left foot with ulceration as well as erythema spreading approximately up to the calf. Admission chemistry showed WBC 15,300, sodium 132, chloride 97, glucose 151, creatinine 1.26, total bilirubin 1.4. Blood cultures no growth to date. X-rays of left foot showed dorsal swelling of the forefoot with no current bony destruction. Patient placed on sepsis protocol status post 1 L lactated Ringer 's. Patient lactic  acidosis was 3.7 and chest x-ray unremarkable. Placed on broad-spectrum antibiotics. Underwent abdominal aortogram showing  no significant renal artery stenosis visualized. The infrarenal abdominal aorta widely patent without significant stenosis. Bilateral common and external iliac arteries widely patent. The left common femoral and profundofemoral artery calcified but patent without significant stenosis. The popliteal artery at the level of the patella had luminal narrowing of approximately 60% circumferentially calcified. The below-knee popliteal artery widely patent with single-vessel runoff via the peroneal artery providing perfusion to the foot. Patient underwent successful shockwave intra-arterial lithotripsy of the 60-70% left above-knee popliteal artery stenosis 07/20/2023 per Dr. Charlotte Cookey. Patient is currently completing a course of Augmentin /doxycycline . Plan is to repeat Doppler as an outpatient in 4 to 6 weeks. He continues with chronic Plavix  for peripheral vascular disease as prior to admission. Blood pressure and heart rate closely monitor with history of atrial fibrillation maintained on Librexa trial drug (comparing Eliquis  versus Milvexian-factor XI inhibitor). Hospital course AKI resolved with gentle IV fluids latest creatinine 0.80. Patient's mental status has cleared appears to be close to baseline. Therapy evaluations completed due to patient's decreased functional mobility was admitted for a comprehensive rehab program.   Patient currently requires min with mobility secondary to muscle weakness and muscle joint tightness, decreased cardiorespiratoy endurance, and decreased standing balance, decreased postural control, and decreased balance strategies.  Prior to hospitalization, patient was modified independent  with mobility and lived with Spouse (wife works at [employer]) in [employer] home.  Home access is 3 STE front entry with 2 railsStairs to enter.  Patient will benefit from skilled PT intervention to maximize safe functional mobility, minimize fall risk, and decrease caregiver burden for planned  discharge home with intermittent assist.  Anticipate patient will benefit from follow up OP at discharge.  PT - End of Session Activity Tolerance: Tolerates 30+ min activity with multiple rests Endurance Deficit: Yes Endurance Deficit Description: required rest breaks throughout PT Assessment Rehab Potential (ACUTE/IP ONLY): Good PT Barriers to Discharge: Home environment access/layout;Decreased caregiver support;Wound Care PT Barriers to Discharge Comments: stairs, discomfort in LLE, fatigue, global weakness/deconditioning PT Patient demonstrates impairments in the following area(s): Balance;Edema;Endurance;Nutrition;Skin Integrity PT Transfers Functional Problem(s): Bed Mobility;Bed to Chair;Car;Furniture PT Locomotion Functional Problem(s): Ambulation;Wheelchair Mobility;Stairs PT Plan PT Intensity: Minimum of 1-2 x/day ,45 to 90 minutes PT Frequency: 5 out of 7 days PT Duration Estimated Length of Stay: 7-10 days PT Treatment/Interventions: Ambulation/gait training;Cognitive remediation/compensation;Discharge planning;DME/adaptive equipment instruction;Functional mobility training;Pain management;Psychosocial support;Therapeutic Activities;Splinting/orthotics;UE/LE Strength taining/ROM;Visual/perceptual remediation/compensation;Wheelchair propulsion/positioning;UE/LE Coordination activities;Therapeutic Exercise;Stair training;Skin care/wound management;Patient/family education;Neuromuscular re-education;Functional electrical stimulation;Disease management/prevention;Community reintegration;Balance/vestibular training PT Transfers Anticipated Outcome(s): Mod I with LRAD PT Locomotion Anticipated Outcome(s): supervision with LRAD PT Recommendation Follow Up Recommendations: Outpatient PT Patient destination: Home Equipment Recommended: To be determined   PT Evaluation Precautions/Restrictions Precautions Precautions: Fall;Other (comment) Precaution/Restrictions Comments: bilat foot  wounds Restrictions Weight Bearing Restrictions Per Provider Order: No Pain Interference Pain Interference Pain Effect on Sleep: 0. Does not apply - I have not had any pain or hurting in the past 5 days Pain Interference with Therapy Activities: 0. Does not apply - I have not received rehabilitationtherapy in the past 5 days Pain Interference with Day-to-Day Activities: 1. Rarely or not at all Home Living/Prior Functioning Home Living Available Help at Discharge: Family;Available PRN/intermittently (plan to hire aide from 9-12 3x/week) Type of Home: House Home Access: Stairs to enter [employer] of Steps: 3 STE front entry with 2 rails Entrance Stairs-Rails: Can reach both;Left;Right Home Layout: Multi-level;Able to live on  main level with bedroom/bathroom Alternate Level Stairs-Number of Steps: 2 steps down into kitchen/living room with 1 railing. Main level has bedroom/bathrooms (2)/office and an upstairs that pt does not have to use Bathroom Shower/Tub: Walk-in shower;Door Bathroom Toilet: Standard Bathroom Accessibility: Yes Additional Comments: pt using RW inside the home and rollator ouside/on the porch.  Lives With: Spouse (wife works at [employer]) Prior Function Level of Independence: Requires assistive device for independence  Able to Take Stairs?: Yes Driving: No (wife drives) Vocation: Retired Optometrist - History Ability to See in Adequate Light: 0 Adequate Perception Perception: Within Functional Limits Praxis Praxis: WFL  Cognition Overall Cognitive Status: Within Functional Limits for tasks assessed Arousal/Alertness: Awake/alert Orientation Level: Oriented X4 Memory: Appears intact Awareness: Appears intact Problem Solving: Appears intact Safety/Judgment: Appears intact Sensation Sensation Light Touch: Appears Intact Hot/Cold: Not tested Proprioception: Appears Intact Stereognosis: Not tested Coordination Gross Motor  Movements are Fluid and Coordinated: Yes Fine Motor Movements are Fluid and Coordinated: Yes Coordination and Movement Description: global weakness/deconditioning, fatigue, discomfort in LLE Finger Nose Finger Test: [facility] bilaterally Motor  Motor Motor: Within Functional Limits Motor - Skilled Clinical Observations: global weakness/deconditioning, fatigue, discomfort in LLE  Trunk/Postural Assessment  Cervical Assessment Cervical Assessment: Exceptions to [facility] (forward head) Thoracic Assessment Thoracic Assessment: Exceptions to [facility] (thoracic rounding) Lumbar Assessment Lumbar Assessment: Exceptions to [facility] (posterior pelvic tilt) Postural Control Postural Control: Within Functional Limits  Balance Balance Balance Assessed: Yes Static Sitting Balance Static Sitting - Balance Support: Feet supported;Bilateral upper extremity supported Static Sitting - Level of Assistance: 6: Modified independent (Device/Increase time) Dynamic Sitting Balance Dynamic Sitting - Balance Support: Feet supported;No upper extremity supported Dynamic Sitting - Level of Assistance: 5: Stand by assistance (supervision) Static Standing Balance Static Standing - Balance Support: During functional activity;Bilateral upper extremity supported (RW) Static Standing - Level of Assistance: 5: Stand by assistance (CGA) Dynamic Standing Balance Dynamic Standing - Balance Support: Bilateral upper extremity supported;During functional activity (RW) Dynamic Standing - Level of Assistance: 4: Min assist Dynamic Standing - Comments: with transfers and gait Extremity Assessment  RLE Assessment RLE Assessment: Exceptions to [facility] General Strength Comments: tested sitting in WC RLE Strength Right Hip Flexion: 4/5 Right Hip ABduction: 4/5 Right Hip ADduction: 4/5 Right Knee Flexion: 4-/5 Right Knee Extension: 4/5 Right Ankle Dorsiflexion: 4/5 Right Ankle Plantar Flexion: 4/5 LLE Assessment LLE Assessment: Exceptions to  [facility] General Strength Comments: tested sitting in WC LLE Strength Left Hip Flexion: 4-/5 Left Hip ABduction: 4/5 Left Hip ADduction: 4/5 Left Knee Flexion: 3-/5 Left Knee Extension: 4-/5 Left Ankle Dorsiflexion: 3+/5 Left Ankle Plantar Flexion: 4-/5  Care Tool Care Tool Bed Mobility Roll left and right activity   Roll left and right assist level: Supervision/Verbal cueing    Sit to lying activity   Sit to lying assist level: Supervision/Verbal cueing    Lying to sitting on side of bed activity         Care Tool Transfers Sit to stand transfer   Sit to stand assist level: Minimal Assistance - Patient > 75% Sit to stand assistive device: Walker  Chair/bed transfer   Chair/bed transfer assist level: Minimal Assistance - Patient > 75% Chair/bed transfer assistive device: Press photographer transfer assist level: Minimal Assistance - Patient > 75%      Care Tool Locomotion Ambulation   Assist level: Minimal Assistance - Patient > 75% Assistive device: Walker-rolling Max distance: 75ft  Walk 10 feet activity  Assist level: Minimal Assistance - Patient > 75% Assistive device: Walker-rolling   Walk 50 feet with 2 turns activity   Assist level: Minimal Assistance - Patient > 75% Assistive device: Walker-rolling  Walk 150 feet activity Walk 150 feet activity did not occur: Safety/medical concerns (fatigue, discomofort in LLE)      Walk 10 feet on uneven surfaces activity   Assist level: Minimal Assistance - Patient > 75% Assistive device: Walker-rolling  Stairs Stair activity did not occur: Safety/medical concerns (fatigue, discomofort in LLE)        Walk up/down 1 step activity Walk up/down 1 step or curb (drop down) activity did not occur: Safety/medical concerns (fatigue, discomofort in LLE)      Walk up/down 4 steps activity Walk up/down 4 steps activity did not occur: Safety/medical concerns (fatigue, discomofort in LLE)      Walk up/down 12 steps  activity Walk up/down 12 steps activity did not occur: Safety/medical concerns (fatigue, discomofort in LLE)      Pick up small objects from floor Pick up small object from the floor (from standing position) activity did not occur: Safety/medical concerns (fatigue, discomofort in LLE)      Wheelchair Is the patient using a wheelchair?: Yes     Wheelchair assist level: Supervision/Verbal cueing Max wheelchair distance: 81ft  Wheel 50 feet with 2 turns activity   Assist Level: Supervision/Verbal cueing  Wheel 150 feet activity   Assist Level: Moderate Assistance - Patient 50 - 74%    Refer to Care Plan for Long Term Goals  SHORT TERM GOAL WEEK 1 PT Short Term Goal 1 (Week 1): STG=LTG due to LOS  Recommendations for other services: None   Skilled Therapeutic Intervention Evaluation completed (see details above and below) with education on PT POC and goals and individual treatment initiated with focus on functional mobility/transfers, generalized strengthening and endurance, dynamic standing balance/coordination, simulated car transfers, and ambulation. Received pt sitting in WC with legs elevated on bed for edema management. Pt familiar with PT evaluation, CIR policies, and therapy schedule from prior admission. Pt denied any pain, just reported generalized discomfort with legs in dependent position. Pt remains hyperverbal, requiring cues for redirection and increased time with mobility.   Pt performed all transfers with RW and min A throughout session. Pt performed WC mobility 56ft using BUE and supervision with emphasis on UE strength and coordination. Pt then ambulated 72ft with RW and min Ato ortho gym. Took seated rest break to elevate BLE, then ambulated additional 10ft with RW and min A and performed simulated car transfer with RW and min A. Then ambulated 40ft on uneven surfaces (ramp) with RW and min A. Pt ambulates with antalgic gait pattern with decreased weight bearing through LLE  due to pain. Discussed follow up nursing/therapy - pt requesting OPPT but agreeable to home health nursing but does not want HHPT - will notify treatment team. Returned to room and requested to return to bed - WC<>bed via stand<>pivot with RW and min A and sit<>supine with supervision. Concluded session with pt sitting in [facility] with all needs within reach.   Mobility Bed Mobility Bed Mobility: Rolling Right;Rolling Left;Sit to Supine Rolling Right: Supervision/verbal cueing Rolling Left: Supervision/Verbal cueing Sit to Supine: Supervision/Verbal cueing Transfers Transfers: Sit to Stand;Stand to Sit;Stand Pivot Transfers Sit to Stand: Minimal Assistance - Patient > 75% Stand to Sit: Minimal Assistance - Patient > 75% Stand Pivot Transfers: Minimal Assistance - Patient > 75% Transfer (Assistive device): Rolling walker Locomotion  Gait Ambulation: Yes Gait Assistance: Minimal Assistance - Patient > 75% Gait Distance (Feet): 50 Feet Assistive device: Rolling walker Gait Gait: Yes Gait Pattern: Impaired Gait Pattern: Decreased stride length;Poor foot clearance - left;Poor foot clearance - right;Narrow base of support;Step-to pattern;Step-through pattern;Decreased step length - right;Decreased step length - left;Decreased stance time - left;Decreased weight shift to left;Trunk flexed;Antalgic Gait velocity: decreased Stairs / Additional Locomotion Stairs: No Corporate treasurer: Yes Wheelchair Assistance: Doctor, general practice: Both upper extremities Wheelchair Parts Management: Needs assistance Distance: 40ft   Discharge Criteria: Patient will be discharged from PT if patient refuses treatment 3 consecutive times without medical reason, if treatment goals not met, if there is a change in medical status, if patient makes no progress towards goals or if patient is discharged from hospital.  The above assessment, treatment plan, treatment  alternatives and goals were discussed and mutually agreed upon: by patient  Ivonne Freeburg M Zaunegger Cythina Mickelsen Zaunegger PT, DPT 07/28/2023, 11:59 AM

## 2023-07-28 NOTE — Progress Notes (Signed)
 Inpatient Rehabilitation Admission Medication Review by a Pharmacist  A complete drug regimen review was completed for this patient to identify any potential clinically significant medication issues.  High Risk Drug Classes Is patient taking? Indication by Medication  Antipsychotic No   Anticoagulant Yes Study-LIBREXIA-AF apixaban  or placebo and Study-LIBREXIA (Milvexian) or placebo: PAF, PAD  Antibiotic Yes Augmentin , Doxycycline -   Opioid Yes Oxycodone - pain  Antiplatelet Yes Plavix  -  Hypoglycemics/insulin  Yes Insulin  aspart SSI- DM  Vasoactive Medication Yes Amlodipine,Metoprolol  succinate - HTN   Chemotherapy No   Other Yes Proscar - BPH  Protonix  - GERD Rosuvastatin , Zetia - HLD      Type of Medication Issue Identified Description of Issue Recommendation(s)  Drug Interaction(s) (clinically significant)     Duplicate Therapy     Allergy     No Medication Administration End Date     Incorrect Dose     Additional Drug Therapy Needed     Significant med changes from prior encounter (inform family/care partners about these prior to discharge). PTA medications: Colchicine ,  lasix , hydrochlorothiazide , metformin  not resumed on CIR admit.  Restart PTA meds when and if necessary during CIR admission or at time of discharge, if warranted.   Other       Clinically significant medication issues were identified that warrant physician communication and completion of prescribed/recommended actions by midnight of the next day:  No  Name of provider notified for urgent issues identified:   Provider Method of Notification:    Pharmacist comments:   Time spent performing this drug regimen review (minutes):  25   Alisa Irish, RPh Clinical Pharmacist 07/28/2023 12:37 PM

## 2023-07-28 NOTE — Progress Notes (Signed)
 Physical Therapy Session Note  Patient Details  Name: Logan French MRN: [id number] Date of Birth: 1934-03-23  Today's Date: 07/28/2023 PT Individual Time: 1415-1456 PT Individual Time Calculation (min): 41 min   Short Term Goals: Week 1:  PT Short Term Goal 1 (Week 1): STG=LTG due to LOS  Skilled Therapeutic Interventions/Progress Updates:    Received pt semi-reclined in bed asleep. Upon wakening, pt agreeable to PT treatment and reported "discomfort" in LLE but denied any pain. Session with emphasis on functional mobility/transfers, generalized strengthening and endurance, dynamic standing balance/coordination, and ambulation. Pt transferred semi-reclined<>sitting L EOB with HOB elevated and use of bedrails with supervision and increased time/effort.   Pt performed all transfers with RW and min A throughout session. Stood and required CGA for balance while pulling shorts over hips. Applied lotion (cleared by RN) to LLE for dry/itchy skin. Pt transported to/from room in [facility] dependently for time management purposes. Pt ambulated 130ft x 2 trials with RW and CGA - cues for upright posture/gaze and to keep RW within BOS. Switched out current RW for more stable one during seated rest break. Returned to room and concluded session with pt sitting in WC at sink with all needs within reach.   Therapy Documentation Precautions:  Restrictions Weight Bearing Restrictions Per Provider Order: No  Therapy/Group: Individual Therapy Nicolas Barren Zaunegger Nena Bank PT, DPT 07/28/2023, 7:20 AM

## 2023-07-28 NOTE — Evaluation (Incomplete)
 Occupational Therapy Assessment and Plan  Patient Details  Name: Logan French MRN: [id number] Date of Birth: 06-02-1934  OT Diagnosis: acute pain, muscle weakness (generalized), and swelling of limb Rehab Potential: Rehab Potential (ACUTE ONLY): Good ELOS: 7-10 days  Today's Date: 07/28/2023 OT Individual Time: 0835 - 0950 Treatment time :  70 min       Hospital Problem: Principal Problem:   Cellulitis   Past Medical History:  Past Medical History:  Diagnosis Date   A-fib (HCC)    B12 deficiency    Diverticulosis    Fatigue    History of colonic polyps    Hyperlipemia    Hypertension    Pernicious anemia    Prostate cancer (HCC)    Syncope    Syncope    Past Surgical History:  Past Surgical History:  Procedure Laterality Date   ABDOMINAL AORTOGRAM N/A 07/20/2023   Procedure: ABDOMINAL AORTOGRAM;  Surgeon: Margherita Shell, MD;  Location: [facility] INVASIVE CV [facility];  Service: Cardiovascular;  Laterality: N/A;   ABDOMINAL AORTOGRAM W/LOWER EXTREMITY N/A 05/17/2023   Procedure: ABDOMINAL AORTOGRAM W/LOWER EXTREMITY;  Surgeon: Philipp Brawn, MD;  Location: [facility] INVASIVE CV LAB;  Service: Cardiovascular;  Laterality: N/A;   CARDIOVERSION N/A 10/07/2021   Procedure: CARDIOVERSION;  Surgeon: Jerryl Morin, DO;  Location: [facility] ENDOSCOPY;  Service: Cardiovascular;  Laterality: N/A;   IR ANGIO INTRA EXTRACRAN SEL COM CAROTID INNOMINATE BILAT MOD SED  09/10/2017   IR ANGIO VERTEBRAL SEL SUBCLAVIAN INNOMINATE BILAT MOD SED  09/10/2017   LOWER EXTREMITY ANGIOGRAPHY N/A 07/20/2023   Procedure: Lower Extremity Angiography;  Surgeon: Margherita Shell, MD;  Location: [facility];  Service: Cardiovascular;  Laterality: N/A;   LOWER EXTREMITY INTERVENTION Left 07/20/2023   Procedure: LOWER EXTREMITY INTERVENTION;  Surgeon: Margherita Shell, MD;  Location: [facility];  Service: Cardiovascular;  Laterality: Left;   PERIPHERAL INTRAVASCULAR LITHOTRIPSY Right 05/17/2023   Procedure: PERIPHERAL  INTRAVASCULAR LITHOTRIPSY;  Surgeon: Philipp Brawn, MD;  Location: [facility] INVASIVE CV LAB;  Service: Cardiovascular;  Laterality: Right;  SFA-POP   PERIPHERAL INTRAVASCULAR LITHOTRIPSY Left 07/20/2023   Procedure: PERIPHERAL INTRAVASCULAR LITHOTRIPSY;  Surgeon: Margherita Shell, MD;  Location: [facility];  Service: Cardiovascular;  Laterality: Left;   PERIPHERAL VASCULAR BALLOON ANGIOPLASTY Right 05/17/2023   Procedure: PERIPHERAL VASCULAR BALLOON ANGIOPLASTY;  Surgeon: Philipp Brawn, MD;  Location: [facility];  Service: Cardiovascular;  Laterality: Right;  SFA-POP    Assessment & Plan Clinical Impression: Logan French is an 89 year old right-handed male with history of atrial fibrillation and cardioversion 10/07/2021 per Dr. Jerryl Morin, hyperlipidemia, syncope, hypertension, pernicious anemia, diabetes mellitus, prostate cancer, peripheral vascular disease status post angioplasty of the right popliteal artery 05/17/2023 per Dr. Susi Eric. Patient did receive CIR 06/25/2023 - 07/07/2023 due to sepsis secondary to right lower extremity cellulitis/streptococcal bacteremia and was discharged home ambulating with a rolling walker contact-guard assist for eating to close supervision. Per chart review patient lives with spouse. Two-level home bed and bath main level. Presented 07/15/2023 with increasing lethargy and disorientation and fever 102.6 as well as blistering of the left foot with ulceration as well as erythema spreading approximately up to the calf. Admission chemistry showed WBC 15,300, sodium 132, chloride 97, glucose 151, creatinine 1.26, total bilirubin 1.4. Blood cultures no growth to date. X-rays of left foot showed dorsal swelling of the forefoot with no current bony destruction. Patient placed on sepsis protocol status post 1 L lactated Ringer 's.  Patient lactic acidosis was 3.7 and chest x-ray unremarkable. Placed on broad-spectrum antibiotics. Underwent abdominal aortogram showing no  significant renal artery stenosis visualized. The infrarenal abdominal aorta widely patent without significant stenosis. Bilateral common and external iliac arteries widely patent. The left common femoral and profundofemoral artery calcified but patent without significant stenosis. The popliteal artery at the level of the patella had luminal narrowing of approximately 60% circumferentially calcified. The below-knee popliteal artery widely patent with single-vessel runoff via the peroneal artery providing perfusion to the foot. Patient underwent successful shockwave intra-arterial lithotripsy of the 60-70% left above-knee popliteal artery stenosis 07/20/2023 per Dr. Charlotte Cookey. Patient is currently completing a course of Augmentin /doxycycline . Plan is to repeat Doppler as an outpatient in 4 to 6 weeks. He continues with chronic Plavix  for peripheral vascular disease as prior to admission. Blood pressure and heart rate closely monitor with history of atrial fibrillation maintained on Librexa trial drug (comparing Eliquis  versus Milvexian-factor XI inhibitor). Hospital course AKI resolved with gentle IV fluids latest creatinine 0.80. Patient's mental status has cleared appears to be close to baseline. Therapy evaluations completed due to patient's decreased functional mobility was admitted for a comprehensive rehab program.   Patient transferred to CIR on 07/27/2023 .    Patient currently requires mod with basic self-care skills secondary to muscle weakness and decreased cardiorespiratoy endurance.  Prior to hospitalization, patient could complete BADL with min.  Patient will benefit from skilled intervention to decrease level of assist with basic self-care skills and increase independence with basic self-care skills prior to discharge home with care partner.  Anticipate patient will require minimal physical assistance and no further OT follow recommended.  OT - End of Session Activity Tolerance: Tolerates 10 - 20  min activity with multiple rests Endurance Deficit: Yes Endurance Deficit Description: required rest breaks throughout OT Assessment Rehab Potential (ACUTE ONLY): Poor OT Barriers to Discharge: Inaccessible home environment;Home environment access/layout;Incontinence;Wound Care OT Patient demonstrates impairments in the following area(s): Balance;Pain;Edema;Endurance;Safety;Motor OT Basic ADL's Functional Problem(s): Bathing;Dressing;Toileting OT Transfers Functional Problem(s): Tub/Shower;Toilet OT Additional Impairment(s): None OT Plan OT Intensity: Minimum of 1-2 x/day, 45 to 90 minutes OT Frequency: 5 out of 7 days OT Duration/Estimated Length of Stay: 7-10 days OT Treatment/Interventions: Balance/vestibular training;Discharge planning;Pain management;Therapeutic Activities;Self Care/advanced ADL retraining;UE/LE Coordination activities;Disease mangement/prevention;Functional mobility training;Patient/family education;Skin care/wound managment;Therapeutic Exercise;DME/adaptive equipment instruction;Neuromuscular re-education;Splinting/orthotics;UE/LE Strength taining/ROM;Wheelchair propulsion/positioning OT Self Feeding Anticipated Outcome(s): Independent OT Basic Self-Care Anticipated Outcome(s): Min A LB dressing OT Toileting Anticipated Outcome(s): Mod I OT Bathroom Transfers Anticipated Outcome(s): Mod I OT Recommendation Patient destination: Home Follow Up Recommendations: 24 hour supervision/assistance Equipment Recommended: None recommended by OT Equipment Details: Has equipment from recent rehab stay   OT Evaluation Precautions/Restrictions  Precautions Precautions: Fall;Other (comment) Precaution/Restrictions Comments: bilat foot wounds Restrictions Weight Bearing Restrictions Per Provider Order: No   Pain Pain Assessment Pain Score: 3  Pain Type: Chronic pain Pain Location: Leg Pain Orientation: Left Pain Descriptors / Indicators: Aching Pain Onset: With  Activity Patients Stated Pain Goal: 0 Pain Intervention(s): Repositioned;Medication (See eMAR) Multiple Pain Sites: No Home Living/Prior Functioning Home Living Family/patient expects to be discharged to:: Private residence Living Arrangements: Spouse/significant other Available Help at Discharge: Family, Available PRN/intermittently (plan to hire aide from 9-12 3x/week) Type of Home: House Home Access: Stairs to enter [employer] of Steps: 3 STE front entry with 2 rails Entrance Stairs-Rails: Can reach both, Left, Right Home Layout: Multi-level, Able to live on main level with bedroom/bathroom Alternate Level Stairs-Number of Steps: 2 steps down  into kitchen/living room with 1 railing. Main level has bedroom/bathrooms (2)/office and an upstairs that pt does not have to use Bathroom Shower/Tub: Psychologist, counselling, Door [employer] Toilet: Standard Bathroom Accessibility: Yes Additional Comments: pt using RW inside the home and rollator ouside/on the porch.  Lives With: Spouse (wife works at [employer]) IADL History Homemaking Responsibilities: No Current License: Yes Occupation: Retired, Part time employment Type of Occupation: Retired Pensions consultant, however still handling a few cases Prior Function Level of Independence: Requires assistive device for independence  Able to Take Stairs?: Yes Driving: No (wife drives) Vocation: Retired Administrator, sports Baseline Vision/History: 1 Wears glasses Ability to See in Adequate Light: 0 Adequate Patient Visual Report: No change from baseline Vision Assessment?: No apparent visual deficits Perception  Perception: Within Functional Limits Praxis Praxis: WFL Cognition Cognition Overall Cognitive Status: Within Functional Limits for tasks assessed Arousal/Alertness: Awake/alert Orientation Level: Person;Place;Situation Person: Oriented Place: Oriented Situation: Oriented Memory: Appears intact Attention: Selective Selective Attention:  Appears intact Awareness: Appears intact Problem Solving: Appears intact Safety/Judgment: Appears intact Brief Interview for Mental Status (BIMS) Repetition of Three Words (First Attempt): 3 Temporal Orientation: Year: Correct Temporal Orientation: Month: Accurate within 5 days Temporal Orientation: Day: Correct Recall: "Sock": Yes, no cue required Recall: "Blue": Yes, no cue required Recall: "Bed": Yes, no cue required BIMS Summary Score: 15 Sensation Sensation Light Touch: Appears Intact Hot/Cold: Not tested Proprioception: Appears Intact Stereognosis: Not tested Coordination Gross Motor Movements are Fluid and Coordinated: Yes Fine Motor Movements are Fluid and Coordinated: Yes Coordination and Movement Description: global weakness/deconditioning, fatigue, discomfort in LLE Finger Nose Finger Test: [facility] bilaterally Motor  Motor Motor: Within Functional Limits Motor - Skilled Clinical Observations: global weakness/deconditioning, fatigue, discomfort in LLE  Trunk/Postural Assessment  Cervical Assessment Cervical Assessment: Exceptions to [facility] Thoracic Assessment Thoracic Assessment: Exceptions to [facility] Lumbar Assessment Lumbar Assessment: Exceptions to [facility] Postural Control Postural Control: Within Functional Limits  Balance Balance Balance Assessed: Yes Static Sitting Balance Static Sitting - Balance Support: Feet supported;Bilateral upper extremity supported Static Sitting - Level of Assistance: 6: Modified independent (Device/Increase time) Dynamic Sitting Balance Dynamic Sitting - Balance Support: Feet supported;No upper extremity supported Dynamic Sitting - Level of Assistance: 5: Stand by assistance Static Standing Balance Static Standing - Balance Support: During functional activity;Bilateral upper extremity supported Static Standing - Level of Assistance: 5: Stand by assistance Dynamic Standing Balance Dynamic Standing - Balance Support: Bilateral upper  extremity supported;During functional activity Dynamic Standing - Level of Assistance: 4: Min assist Dynamic Standing - Comments: with transfers and gait Extremity/Trunk Assessment RUE Assessment RUE Assessment: Within Functional Limits LUE Assessment LUE Assessment: Within Functional Limits  Care Tool Care Tool Self Care Eating   Eating Assist Level: Set up assist    Oral Care    Oral Care Assist Level: Set up assist    Bathing   Body parts bathed by patient: Right arm;Left arm;Chest;Abdomen;Front perineal area;Right upper leg;Left upper leg;Face;Buttocks   Body parts n/a: Left lower leg;Right lower leg Assist Level: Minimal Assistance - Patient > 75%    Upper Body Dressing(including orthotics)   What is the patient wearing?: Pull over shirt   Assist Level: Set up assist    Lower Body Dressing (excluding footwear)   What is the patient wearing?: Pants;Incontinence brief Assist for lower body dressing: Moderate Assistance - Patient 50 - 74%    Putting on/Taking off footwear   What is the patient wearing?: Ted hose;Socks Assist for footwear: Dependent - Patient 0%  Care Tool Toileting Toileting activity Toileting Activity did not occur (Clothing management and hygiene only): N/A (no void or bm)       Care Tool Bed Mobility Roll left and right activity   Roll left and right assist level: Supervision/Verbal cueing    Sit to lying activity   Sit to lying assist level: Supervision/Verbal cueing    Lying to sitting on side of bed activity   Lying to sitting on side of bed assist level: the ability to move from lying on the back to sitting on the side of the bed with no back support.: Moderate Assistance - Patient 50 - 74%     Care Tool Transfers Sit to stand transfer   Sit to stand assist level: Moderate Assistance - Patient 50 - 74% Sit to stand assistive device: Walker  Chair/bed transfer   Chair/bed transfer assist level: Minimal Assistance - Patient >  75% Chair/bed transfer assistive device: Museum/gallery exhibitions officer transfer   Assist Level: Minimal Assistance - Patient > 75%     Care Tool Cognition  Expression of Ideas and Wants Expression of Ideas and Wants: 4. Without difficulty (complex and basic) - expresses complex messages without difficulty and with speech that is clear and easy to understand  Understanding Verbal and Non-Verbal Content Understanding Verbal and Non-Verbal Content: 4. Understands (complex and basic) - clear comprehension without cues or repetitions   Memory/Recall Ability Memory/Recall Ability : Current season;Location of own room;Staff names and faces;That he or she is in a hospital/hospital unit   Refer to Care Plan for Long Term Goals  SHORT TERM GOAL WEEK 1 OT Short Term Goal 1 (Week 1): Pt will complete sit > stand in prep for ADL with CGA using LRAD OT Short Term Goal 2 (Week 1): Pt will engage in ADL task at sink in stance for 3 mins with min A to promote standing tolerance OT Short Term Goal 3 (Week 1): Pt will utilize AE PRN to thread LB clothing with supervision  Recommendations for other services: None    Skilled Therapeutic Intervention Patient received supine in bed.  Patient very talkative, very pleasant.  Patient reports that his symptoms are improving - very pleased with the progress in his RLE.  Patient able to wash and put on shirt and shorts as indicated below.  Patient not excited about wearing compression hose as ordered.  Reviewed benefits.  Right compression stocking applied, left LE ace wrapped.  Patient left up in wheelchair with LE's resting on bed as he preferred to not have  elevating leg rests on chair in room.   ADL ADL Eating: Set up Where Assessed-Eating: Bed level Grooming: Setup Where Assessed-Grooming: Sitting at sink Upper Body Bathing: Setup Where Assessed-Upper Body Bathing: Sitting at sink Lower Body Bathing: Minimal assistance Where Assessed-Lower Body Bathing: Sitting at  sink;Standing at sink Upper Body Dressing: Setup Where Assessed-Upper Body Dressing: Sitting at sink Lower Body Dressing: Moderate assistance Where Assessed-Lower Body Dressing: Sitting at sink;Standing at sink Toileting: Unable to assess Toilet Transfer: Minimal assistance Toilet Transfer Method: Stand pivot Toilet Transfer Equipment: Bedside commode Tub/Shower Transfer: Unable to assess Tub/Shower Transfer Method: Unable to assess [employer] Transfer: Unable to assess [employer] Transfer Method: Unable to assess Mobility  Bed Mobility Bed Mobility: Rolling Right;Rolling Left;Sit to Supine Rolling Right: Supervision/verbal cueing Rolling Left: Supervision/Verbal cueing Sit to Supine: Supervision/Verbal cueing Transfers Sit to Stand: Minimal Assistance - Patient > 75% Stand to Sit: Minimal Assistance - Patient > 75%  Discharge Criteria: Patient will be discharged from OT if patient refuses treatment 3 consecutive times without medical reason, if treatment goals not met, if there is a change in medical status, if patient makes no progress towards goals or if patient is discharged from hospital.  The above assessment, treatment plan, treatment alternatives and goals were discussed and mutually agreed upon: by patient  Makendra Vigeant M 07/28/2023, 12:58 PM

## 2023-07-28 NOTE — Progress Notes (Signed)
 Inpatient Rehabilitation  Patient information reviewed and entered into eRehab system by [facility]. Karen Kays., CCC/SLP, PPS Coordinator.  Information including medical coding, functional ability and quality indicators will be reviewed and updated through discharge.

## 2023-07-29 ENCOUNTER — Ambulatory Visit: Payer: Medicare Other | Admitting: Neurology

## 2023-07-29 DIAGNOSIS — L0390 Cellulitis, unspecified: Secondary | ICD-10-CM | POA: Diagnosis not present

## 2023-07-29 LAB — GLUCOSE, CAPILLARY
Glucose-Capillary: 100 mg/dL — ABNORMAL HIGH (ref 70–99)
Glucose-Capillary: 164 mg/dL — ABNORMAL HIGH (ref 70–99)
Glucose-Capillary: 101 mg/dL — ABNORMAL HIGH (ref 70–99)
Glucose-Capillary: 132 mg/dL — ABNORMAL HIGH (ref 70–99)

## 2023-07-29 NOTE — Progress Notes (Incomplete)
 Patient ID: Logan French, male   DOB: 1934/06/20, 89 y.o.   MRN: [id number]  SW met with pt in room to discuss outpatient PT referral faxed to [facility]. SW informed on d/c date 5/23.   SW faxed outpatient PT referral to [facility].    Norval Been, MSW, LCSW Office: [phone] Cell: [phone] Fax: [phone]

## 2023-07-29 NOTE — Progress Notes (Signed)
 Physical Therapy Session Note  Patient Details  Name: Logan French MRN: [id number] Date of Birth: 1934/09/03  Today's Date: 07/29/2023 PT Individual Time: 4098-1191 PT Individual Time Calculation (min): 54 min   Short Term Goals: Week 1:  PT Short Term Goal 1 (Week 1): STG=LTG due to LOS  Skilled Therapeutic Interventions/Progress Updates:   Received pt sitting in WC reporting "not doing well".  Pt with little appetite (didn't eat much lunch) and fatigued. Pt reported wanting to get back into bed after shower with OT this morning but staff wanted him to stay up until 1pm session. Pt agreeable to PT treatment and denied any pain during session, just "discomfort" in LLE. Session with emphasis on functional mobility/transfers, generalized strengthening and endurance, dynamic standing balance/coordination, stair navigation, and ambulation.  Pt transported to main therapy gym in [facility] dependently for time management purposes. Pt performed all transfers with RW and CGA/min A throughout session. Pt navigated 4 6in steps with 2 handrails and min A x 2 trials with cues for "up with the good, down with the bad" technique. Pt reported steps were "harder than they should be" today due to fatigue. Took seated rest break then navigated additional 4 6in steps with R handrail and min A using a lateral stepping technique. Of note, pt mildly impulsive and stood to navigate 2nd round of steps when therapist stepped away to measure ambulation distances.   Pt ambulated 162ft x1, 275ft x 1, and 173ft x 1 with RW and CGA throughout session - cues for upright posture/gaze and to keep RW within BOS. Returned to room and transitioned into supine immediately to rest. Applied lotion to LLE for dry skin. Concluded session with pt supine in bed asleep, needs within reach, and bed alarm on. Pt aware of upcoming OT session.   Therapy Documentation Precautions:  Precautions Precautions: Fall, Other  (comment) Precaution/Restrictions Comments: bilat foot wounds Restrictions Weight Bearing Restrictions Per Provider Order: No  Therapy/Group: Individual Therapy Nicolas Barren Zaunegger Nena Bank PT, DPT 07/29/2023, 6:52 AM

## 2023-07-29 NOTE — Progress Notes (Signed)
 Inpatient Rehabilitation Care Coordinator Assessment and Plan Patient Details  Name: Logan French MRN: [id number] Date of Birth: 1934/09/22  Today's Date: 07/29/2023  Hospital Problems: Principal Problem:   Cellulitis  Past Medical History:  Past Medical History:  Diagnosis Date   A-fib (HCC)    B12 deficiency    Diverticulosis    Fatigue    History of colonic polyps    Hyperlipemia    Hypertension    Pernicious anemia    Prostate cancer (HCC)    Syncope    Syncope    Past Surgical History:  Past Surgical History:  Procedure Laterality Date   ABDOMINAL AORTOGRAM N/A 07/20/2023   Procedure: ABDOMINAL AORTOGRAM;  Surgeon: Margherita Shell, MD;  Location: [facility] INVASIVE CV LAB;  Service: Cardiovascular;  Laterality: N/A;   ABDOMINAL AORTOGRAM W/LOWER EXTREMITY N/A 05/17/2023   Procedure: ABDOMINAL AORTOGRAM W/LOWER EXTREMITY;  Surgeon: Philipp Brawn, MD;  Location: [facility] INVASIVE CV LAB;  Service: Cardiovascular;  Laterality: N/A;   CARDIOVERSION N/A 10/07/2021   Procedure: CARDIOVERSION;  Surgeon: Jerryl Morin, DO;  Location: [facility] ENDOSCOPY;  Service: Cardiovascular;  Laterality: N/A;   IR ANGIO INTRA EXTRACRAN SEL COM CAROTID INNOMINATE BILAT MOD SED  09/10/2017   IR ANGIO VERTEBRAL SEL SUBCLAVIAN INNOMINATE BILAT MOD SED  09/10/2017   LOWER EXTREMITY ANGIOGRAPHY N/A 07/20/2023   Procedure: Lower Extremity Angiography;  Surgeon: Margherita Shell, MD;  Location: [facility] INVASIVE CV LAB;  Service: Cardiovascular;  Laterality: N/A;   LOWER EXTREMITY INTERVENTION Left 07/20/2023   Procedure: LOWER EXTREMITY INTERVENTION;  Surgeon: Margherita Shell, MD;  Location: [facility] INVAS[facility] CV [facility];  Service: Cardiovascular;  Laterality: Left;   PERIPHERAL INTRAVASCULAR LITHOTRIPSY Right 05/17/2023   Procedure: PERIPHERAL INTRAVASCULAR LITHOTRIPSY;  Surgeon: Philipp Brawn, MD;  Location: [facility] INVASIVE CV LAB;  Service: Cardiovascular;  Laterality: Right;  SFA-POP   PERIPHERAL INTRAVASCULAR LITHOTRIPSY Left  07/20/2023   Procedure: PERIPHERAL INTRAVASCULAR LITHOTRIPSY;  Surgeon: Margherita Shell, MD;  Location: [facility]VAS[facility] CV LAB;  Service: Cardiovascular;  Laterality: Left;   PERIPHERAL VASCULAR BALLOON ANGIOPLASTY Right 05/17/2023   Procedure: PERIPHERAL VASCULAR BALLOON ANGIOPLASTY;  Surgeon: Philipp Brawn, MD;  Location: [facility];  Service: Cardiovascular;  Laterality: Right;  SFA-POP   Social History:  reports that he has never smoked. He has never used smokeless tobacco. He reports current alcohol  use of about 2.0 standard drinks of alcohol  per week. He reports that he does not use drugs. Family / Support Systems Marital Status: Married How Long?: since 1962 Patient Roles: Spouse Spouse/Significant Other: Haskell Linker (wife) Children: 3 children- Acupuncturist (lives in [place]), [place] ([place]; lives in [place]); Daivd Dub (lives in [place]) Other Supports: none reported Anticipated Caregiver: wife Ability/Limitations of Caregiver: Patient reports that his wife works PT as a Systems analyst. He indicates she will be able to help. Caregiver Availability: Intermittent Family Dynamics: Pt lives with his wife.   Social History Preferred language: English Religion: Presbyterian Cultural Background: pt has been working as a Equities trader since 1964. He is not a Cytogeneticist. Education: [employer] - How often do you need to have someone help you when you read instructions, pamphlets, or other written material from your doctor or pharmacy?: Never Writes: Yes Employment Status: Employed Name of Employer: self-employed Return to Work Plans: TBD Marine scientist Issues: Denies Guardian/Conservator: Denies    Abuse/Neglect Abuse/Neglect Assessment Can Be Completed: Yes Physical Abuse: Denies Verbal Abuse: Denies Sexual Abuse: Denies Exploitation of patient/patient's resources:  Denies Self-Neglect: Denies   Patient response to: Social Isolation - How often do you  feel lonely or isolated from those around you?: Rarely   Emotional Status Pt's affect, behavior and adjustment status: Pt in good spirits at time of visit Recent Psychosocial Issues: Denies Psychiatric History: Denies Substance Abuse History: Pt reports occassioanl etoh use; denies tobacco products and/or rec drug use   Patient / Family Perceptions, Expectations & Goals Pt/Family understanding of illness & functional limitations: Pt has general understanding of care needs Premorbid pt/family roles/activities: INdependent Anticipated changes in roles/activities/participation: Assistance with ADLs/IADLs Pt/family expectations/goals: Pt goal is to work on getting out of here   [employer]: None Premorbid Home Care/DME Agencies: None Transportation available at discharge: TBD Is the patient able to respond to transportation needs?: Yes In the past 12 months, has lack of transportation kept you from medical appointments or from getting medications?: No In the past 12 months, has lack of transportation kept you from meetings, work, or from getting things needed for daily living?: No Resource referrals recommended: Neuropsychology   Discharge Planning Living Arrangements: Spouse/significant other Support Systems: Spouse/significant other Type of Residence: Private residence Insurance Resources: Electrical engineer Resources: Employment Financial Screen Referred: No Living Expenses: Banker Management: Patient, Spouse Does the patient have any problems obtaining your medications?: No Home Management: Pt wife manages all home care Patient/Family Preliminary Plans: TBD Care Coordinator Barriers to Discharge: Decreased caregiver support, Lack of/limited family support, Insurance for SNF coverage Care Coordinator Anticipated Follow Up Needs: HH/OP Expected length of stay: D/c 5/23   Clinical Impression SW familiar with pt from previous admission. Pt is not  a Cytogeneticist. HCPOA- wife Haskell Linker, and Geralyn Knee (son). DME: rollator and shower chair with back and armrests. Per therapy, pt refers [facility] for PT.  Teah Votaw A Autumne Kallio 07/29/2023, 8:45 PM

## 2023-07-29 NOTE — Progress Notes (Signed)
 Patient ID: Logan French, male   DOB: 1934-11-30, 89 y.o.   MRN: [id number]   1548- SW left message for patient wife to inform on d/c date 5/23. SW requested follow-up to schedule family edu.   Norval Been, MSW, LCSW Office: [phone] Cell: [phone] Fax: [phone]

## 2023-07-29 NOTE — Care Management (Signed)
 Inpatient Rehabilitation Center Individual Statement of Services  Patient Name:  Logan French  Date:  07/29/2023  Welcome to the Inpatient Rehabilitation Center.  Our goal is to provide you with an individualized program based on your diagnosis and situation, designed to meet your specific needs.  With this comprehensive rehabilitation program, you will be expected to participate in at least 3 hours of rehabilitation therapies Monday-Friday, with modified therapy programming on the weekends.  Your rehabilitation program will include the following services:  Physical Therapy (PT), Occupational Therapy (OT), 24 hour per day rehabilitation nursing, Therapeutic Recreaction (TR), Psychology, Neuropsychology, Care Coordinator, Rehabilitation Medicine, Nutrition Services, Pharmacy Services, and Other  Weekly team conferences will be held on Wednesdays to discuss your progress.  Your Inpatient Rehabilitation Care Coordinator will talk with you frequently to get your input and to update you on team discussions.  Team conferences with you and your family in attendance may also be held.  Expected length of stay: 7-10 days    Overall anticipated outcome: Independent with an Assistive Device  Depending on your progress and recovery, your program may change. Your Inpatient Rehabilitation Care Coordinator will coordinate services and will keep you informed of any changes. Your Inpatient Rehabilitation Care Coordinator's name and contact numbers are listed  below.  The following services may also be recommended but are not provided by the Inpatient Rehabilitation Center:  Driving Evaluations Home Health Rehabiltiation Services Outpatient Rehabilitation Services Vocational Rehabilitation   Arrangements will be made to provide these services after discharge if needed.  Arrangements include referral to agencies that provide these services.  Your insurance has been verified to be:  Medicare A/B  Your  primary doctor is:  Eilene Grater  Pertinent information will be shared with your doctor and your insurance company.  Inpatient Rehabilitation Care Coordinator:  Kathey Pang [phone] or ([phone]  Information discussed with and copy given to patient by: Rennis Case, 07/29/2023, 1:12 PM

## 2023-07-29 NOTE — Progress Notes (Signed)
 PROGRESS NOTE   Subjective/Complaints: No new complaints this morning Asks whether he needs teds for RLE, discussed that he does not given that swelling is much improved  ROS: Patient denies fever, new vision changes, dizziness, nausea, vomiting, diarrhea,  shortness of breath or chest pain, headache, or mood change. +RLE swelling    Objective:   No results found. Recent Labs    07/28/23 0520  WBC 9.4  HGB 11.7*  HCT 37.6*  PLT 478*   Recent Labs    07/28/23 0520  NA 136  K 4.1  CL 104  CO2 24  GLUCOSE 105*  BUN 10  CREATININE 0.89  CALCIUM  8.5*    Intake/Output Summary (Last 24 hours) at 07/29/2023 1103 Last data filed at 07/29/2023 0900 Gross per 24 hour  Intake 720 ml  Output 1250 ml  Net -530 ml        Physical Exam: Vital Signs Blood pressure 122/74, pulse 62, temperature 97.7 F (36.5 C), resp. rate 17, height 6' (1.829 m), weight 93.7 kg, SpO2 100%.   General: Shaving with OT HEENT: Head is normocephalic, atraumatic, PERRLA, EOMI, sclera anicteric, oral mucosa pink and moist Neck: Supple without JVD or lymphadenopathy Heart: Reg rate and rhythm. Chest: CTA bilaterally without wheezes, rales, or rhonchi; no distress Abdomen: Soft, non-tender, non-distended, bowel sounds positive. Psych: Pt's affect is appropriate. Pt is cooperative Skin: Pt with 2 incisions along left calf with minimal serous drainage. Healing wounds/scabs on 1st MT and great toe of left foot. Right achilles area with linear wound with minimal serous drainage/granulation, measuring about 4cm x 1cm in area, almost closed.   MSK:    Cervical back: Normal range of motion.     Comments: Pt's left leg is tender to palpation. Only trace edema bilateral LE's.    Neuro:     Comments: Alert and oriented x 3. Normal insight and awareness. Intact Memory. Normal language and speech. Cranial nerve exam unremarkable. MMT: BUE grossly 4/5  prox to distal. BLE 3+HF and KE and 4- ADF/PF. Mild sensory loss distally in both legs but otherwise sensory exam appears intact. DTR's 1+. No abnl resting tone.    Stable 5/15     Assessment/Plan: 1. Functional deficits which require 3+ hours per day of interdisciplinary therapy in a comprehensive inpatient rehab setting. Physiatrist is providing close team supervision and 24 hour management of active medical problems listed below. Physiatrist and rehab team continue to assess barriers to discharge/monitor patient progress toward functional and medical goals  Care Tool:  Bathing    Body parts bathed by patient: Right arm, Left arm, Chest, Abdomen, Front perineal area, Right upper leg, Left upper leg, Face   Body parts bathed by helper: Buttocks, Right lower leg Body parts n/a: Left lower leg   Bathing assist Assist Level: Minimal Assistance - Patient > 75%     Upper Body Dressing/Undressing Upper body dressing   What is the patient wearing?: Pull over shirt    Upper body assist Assist Level: Set up assist    Lower Body Dressing/Undressing Lower body dressing      What is the patient wearing?: Incontinence brief, Pants  Lower body assist Assist for lower body dressing: Minimal Assistance - Patient > 75%     Toileting Toileting Toileting Activity did not occur Press photographer and hygiene only): N/A (no void or bm)  Toileting assist Assist for toileting: Maximal Assistance - Patient 25 - 49%     Transfers Chair/bed transfer  Transfers assist  Chair/bed transfer activity did not occur: Safety/medical concerns  Chair/bed transfer assist level: Minimal Assistance - Patient > 75% Chair/bed transfer assistive device: Geologist, engineering   Ambulation assist      Assist level: Minimal Assistance - Patient > 75% Assistive device: Walker-rolling Max distance: 46ft   Walk 10 feet activity   Assist     Assist level: Minimal Assistance -  Patient > 75% Assistive device: Walker-rolling   Walk 50 feet activity   Assist    Assist level: Minimal Assistance - Patient > 75% Assistive device: Walker-rolling    Walk 150 feet activity   Assist Walk 150 feet activity did not occur: Safety/medical concerns (fatigue, discomofort in LLE)         Walk 10 feet on uneven surface  activity   Assist     Assist level: Minimal Assistance - Patient > 75% Assistive device: Development worker, international aid     Assist Is the patient using a wheelchair?: Yes      Wheelchair assist level: Supervision/Verbal cueing Max wheelchair distance: 60ft    Wheelchair 50 feet with 2 turns activity    Assist        Assist Level: Supervision/Verbal cueing   Wheelchair 150 feet activity     Assist      Assist Level: Moderate Assistance - Patient 50 - 74%   Blood pressure 122/74, pulse 62, temperature 97.7 F (36.5 C), resp. rate 17, height 6' (1.829 m), weight 93.7 kg, SpO2 100%.  Medical Problem List and Plan: 1. Functional deficits secondary to sepsis secondary to cellulitis.  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  Plan follow-up Dopplers 4 to 6 weeks.             -patient may  shower if left leg is covered             -ELOS/Goals: 7-10 days, supervision to mod I with PT and sup/min with OT  -Team conference today please see physician documentation under team conference tab, met with team  to discuss problems,progress, and goals. Formulized individual treatment plan based on medical history, underlying problem and comorbidities.   -Team conference today please see physician documentation under team conference tab, met with team  to discuss problems,progress, and goals. Formulized individual treatment plan based on medical history, underlying problem and comorbidities.    2.  Antithrombotics: -DVT/anticoagulation:  Mechanical: Antiembolism stockings,  thigh (TED hose) Bilateral lower extremities             -antiplatelet therapy: Plavix  75 mg daily 3. Pain Management: Oxycodone  as needed             -pain seems fairly controlled 4. Mood/Behavior/Sleep: Provide emotional support             -antipsychotic agents: N/A 5. Neuropsych/cognition: This patient is capable of making decisions on his own behalf. 6. Skin/Wound Care: Wounds on both legs appear to be healing nicely.              -continue dry dressings with kerlix to left calf and foot as well as right achilles area  -  5/14 seen by WOC- for leg wounds 7. Fluids/Electrolytes/Nutrition: Routine in and outs with follow-up chemistries 8.  ID/cellulitis..  Completing course of Augmentin /doxycycline  9.  AKI.  Resolved.  Follow-up chemistries  - 5/14 BUN/creatinine stable 10.  Diabetes mellitus.  Hemoglobin A1c 6.4.  SSI  - CBGs stable, continue to monitor CBG (last 3)  Recent Labs    07/28/23 1611 07/28/23 2039 07/29/23 0613  GLUCAP 107* 168* 100*    11.  Hypertension.  Norvasc 2.5 mg daily.  Monitor with increased mobility                 07/29/2023    8:51 AM 07/29/2023    5:08 AM 07/28/2023    6:23 PM  Vitals with BMI  Systolic 122 126 130  Diastolic 74 77 81  Pulse 62 58 62    12.  History of prostate cancer.  Continue Proscar  5 mg daily  13.  Atrial fibrillation.  Cardiac rate controlled.  continue Librexia trial drug per cardiology services as well as Toprol -XL 12.5 mg every 12 hours  14.  Hyperlipidemia.  Continue Crestor /Zetia   15.  Elevated LFTs: d/c tylenol   LOS: 2 days A FACE TO FACE EVALUATION WAS PERFORMED  Morgen Ritacco P Derek Laughter 07/29/2023, 11:03 AM

## 2023-07-29 NOTE — Progress Notes (Signed)
 Occupational Therapy Session Note  Patient Details  Name: Logan French MRN: [id number] Date of Birth: Mar 10, 1935  Today's Date: 07/29/2023 OT Individual Time: 1420-1530 OT Individual Time Calculation (min): 70 min    Short Term Goals: Week 1:  OT Short Term Goal 1 (Week 1): Pt will complete sit > stand in prep for ADL with CGA using LRAD OT Short Term Goal 2 (Week 1): Pt will engage in ADL task at sink in stance for 3 mins with min A to promote standing tolerance OT Short Term Goal 3 (Week 1): Pt will utilize AE PRN to thread LB clothing with supervision  Skilled Therapeutic Interventions/Progress Updates:  Skilled OT intervention completed with focus on ambulatory transfers, cardiovascular endurance, LLE ROM. Pt received supine in bed asleep. Easily woken and agreeable to session. L ankle pain reported, pre-medicated however nurse consulted about pt request for additional pain meds. OT offered rest breaks and repositioning throughout for pain reduction.  Pt required extensive time to arouse and participate in session. Pt reports he's been fatiguing quickly after activity and felt the need for a nap following earlier AM shower session with OT, but was unable to get one in.  Transitioned > EOB using bed rails with supervision. CGA sit > stand using RW. Ambulated 100 ft with CGA using RW, with intermittent cues for maintaining proximity of RW, as well as stepping strategy. Pt with current RLE step to the LLE, vs stepping through reciprocal pattern that pt did not have during prior admission. Pt verbalized that LLE discomfort in ankle is the cause. Discussed how having a goal of reciprocal BLE pattern can increase efficiency/length of distance covered especially at the community level.  Pt completed the following intervals on nustep to promote L ankle/knee ROM in a pain free zone needed for functional mobility and BADLs: -12 mins, level 3 with focus on ROM vs speed  Ambulated back to room  for toileting need with prior assist, however slight improvement in reciprocal stepping pattern due to improved discomfort. CGA needed for positioning self in front of toilet and min A to lower to toilet seat without BSC for trial at avoiding fluids spilling out of BSC rim per pt's history. Pt with increase in discomfort in L knee with low seat. Pt continent of urinary void and BM; nurse notified. Pt was unable to stand despite multiple different hand placements and max A, therefore retrieved stedy, and pt required heavy min A to stand in stedy with pillow in front of knees for comfort. Dependent for toileting steps, dependent transfer due to time constraint > EOB. Supervision for sit > supine transition. Pt remained semi supine in bed, with bed alarm on/activated, and with all needs in reach at end of session.   Therapy Documentation Precautions:  Precautions Precautions: Fall, Other (comment) Precaution/Restrictions Comments: bilat foot wounds Restrictions Weight Bearing Restrictions Per Provider Order: No    Therapy/Group: Individual Therapy  Ruthanna Covert, MS, OTR/L  07/29/2023, 3:41 PM

## 2023-07-29 NOTE — Progress Notes (Signed)
 Occupational Therapy Session Note  Patient Details  Name: Logan French MRN: [id number] Date of Birth: 04-Jun-1934  Today's Date: 07/29/2023 OT Individual Time: 6578-4696 OT Individual Time Calculation (min): 60 min    Short Term Goals: Week 1:  OT Short Term Goal 1 (Week 1): Pt will complete sit > stand in prep for ADL with CGA using LRAD OT Short Term Goal 2 (Week 1): Pt will engage in ADL task at sink in stance for 3 mins with min A to promote standing tolerance OT Short Term Goal 3 (Week 1): Pt will utilize AE PRN to thread LB clothing with supervision  Skilled Therapeutic Interventions/Progress Updates:    1:1 Pt received in the bed. Pt had been incontinent of urine in brief and bed linens were wet. Pt came to EOB with min A with difficulty bringing right hip forward. Pt ambulated to the bathroom with min A and participated in showering with bilateral lower legs covered. Pt able to bathe all parts except for right side of buttocks. And lower right leg was washed after dressing sitting in the w/c. Pt able to done clothing with overall min A. Pt benefited from strategy to dress left LE first for successful threading both pant legs and brief. Pt able to perform grooming at sink with setup in seated position. Pt required min A for sit to stands - some sit to stands required two attempts. Pt left sitting up in w/c with legs elevated.   Therapy Documentation Precautions:  Precautions Precautions: Fall, Other (comment) Precaution/Restrictions Comments: bilat foot wounds Restrictions Weight Bearing Restrictions Per Provider Order: No General:   Vital Signs: Therapy Vitals Pulse Rate: 62 BP: 122/74 Pain:  None reported in session   Therapy/Group: Individual Therapy  [name] [facility] 07/29/2023, 10:44 AM

## 2023-07-29 NOTE — Plan of Care (Signed)
  Problem: Consults Goal: RH GENERAL PATIENT EDUCATION Description: See Patient Education module for education specifics. Outcome: Progressing   Problem: RH BOWEL ELIMINATION Goal: RH STG MANAGE BOWEL WITH ASSISTANCE Description: STG Manage Bowel with toileting Assistance. Outcome: Progressing Goal: RH STG MANAGE BOWEL W/MEDICATION W/ASSISTANCE Description: STG Manage Bowel with Medication with mod I Assistance. Outcome: Progressing   Problem: RH BLADDER ELIMINATION Goal: RH STG MANAGE BLADDER WITH ASSISTANCE Description: STG Manage Bladder With Assistance Outcome: Progressing Goal: RH STG MANAGE BLADDER WITH MEDICATION WITH ASSISTANCE Description: STG Manage Bladder With Medication With mod I Assistance. Outcome: Progressing   Problem: RH SKIN INTEGRITY Goal: RH STG SKIN FREE OF INFECTION/BREAKDOWN Description: Manage skin/wounds w min assist Outcome: Progressing Goal: RH STG MAINTAIN SKIN INTEGRITY WITH ASSISTANCE Description: STG Maintain Skin Integrity With min Assistance. Outcome: Progressing Goal: RH STG ABLE TO PERFORM INCISION/WOUND CARE W/ASSISTANCE Description: STG Able To Perform Incision/Wound Care With min Assistance. Outcome: Progressing   Problem: RH SAFETY Goal: RH STG ADHERE TO SAFETY PRECAUTIONS W/ASSISTANCE/DEVICE Description: STG Adhere to Safety Precautions With cues Assistance/Device. Outcome: Progressing   Problem: RH PAIN MANAGEMENT Goal: RH STG PAIN MANAGED AT OR BELOW PT'S PAIN GOAL Description: < 4 with prns Outcome: Progressing   Problem: RH KNOWLEDGE DEFICIT GENERAL Goal: RH STG INCREASE KNOWLEDGE OF SELF CARE AFTER HOSPITALIZATION Description: Patient and family will be able to manage care at discharge using educational resources independently Outcome: Progressing

## 2023-07-30 LAB — GLUCOSE, CAPILLARY: Glucose-Capillary: 100 mg/dL — ABNORMAL HIGH (ref 70–99)

## 2023-07-30 NOTE — Progress Notes (Signed)
 Physical Therapy Session Note  Patient Details  Name: Logan French MRN: [id number] Date of Birth: [birth year]  Today's Date: 07/30/2023 PT Individual Time: 0800-0900 and 215-245 PT Individual Time Calculation (min): 60 min and 30 min.  Short Term Goals: Week 1:  PT Short Term Goal 1 (Week 1): STG=LTG due to LOS  Skilled Therapeutic Interventions/Progress Updates:   First session:  Pt presents sitting in w/c at sink washing face and brushing teeth.  Shorts donned over feet w/ mod A in sitting.  Pt transferred sit to stand w/ CGA and cues for sequencing and brief and pants donned w/ min A.  Pt wheeled to main gym w/ supervision for improved UB strength and endurance.  Pt transferred sit to stan throughout session w/ CGA and cues, slightly increased time.  Pt amb x 157' x 2 w/ RW and CGA, cues for posture and reciprocal gait which improves w/ distance.  Pt does not wish to use shoes 2/2 edema and wrap to L foot.  PT encouraging use.  Pt performed standing hooking horseshoes over BBALL hoop to encourage posture and balance, alternating hands.  Pt then performed w/ 6" platform under L foot for increased challenge.  Pt amb x 165' to room and remained sitting in w/c w/ BLES elevated on bed, w/ all needs in reach.  Second session:  Pt handed off from OT in Dayroom, agreeable to therapy.  Pt wheeled to small gym but group session in progress.  Pt amb to main gym w/ RW and CGA, decreased cues needed for posture, visual scanning and reciprocal gait pattern.  Pt ascended 4 steps w/ 2 rails and then descended using B hands on R rail to simulate steps into living room w/ min A.  Pt amb x 175' w/ RW and CGA to room.  Pt stood to doff shorts and then transferred sit to supine in bed w/ supervision, use of siderails.  Pt handed off to NT for vitals.     Therapy Documentation Precautions:  Precautions Precautions: Fall, Other (comment) Precaution/Restrictions Comments: bilat foot  wounds Restrictions Weight Bearing Restrictions Per Provider Order: No General:   Vital Signs:   Pain:states discomfort w/o pain to L foot,  Pt states discomfort only to L shin.      Therapy/Group: Individual Therapy  Larsen Dungan P Waldon Sheerin 07/30/2023, 9:04 AM

## 2023-07-30 NOTE — Plan of Care (Signed)
  Problem: Consults Goal: RH GENERAL PATIENT EDUCATION Description: See Patient Education module for education specifics. 07/30/2023 1221 by Jenetta Misty, RN Outcome: Progressing 07/30/2023 1221 by Jenetta Misty, RN Outcome: Progressing   Problem: RH BOWEL ELIMINATION Goal: RH STG MANAGE BOWEL WITH ASSISTANCE Description: STG Manage Bowel with toileting Assistance. 07/30/2023 1221 by Jenetta Misty, RN Outcome: Progressing 07/30/2023 1221 by Jenetta Misty, RN Outcome: Progressing Goal: RH STG MANAGE BOWEL W/MEDICATION W/ASSISTANCE Description: STG Manage Bowel with Medication with mod I Assistance. 07/30/2023 1221 by Jenetta Misty, RN Outcome: Progressing 07/30/2023 1221 by Jenetta Misty, RN Outcome: Progressing   Problem: RH BLADDER ELIMINATION Goal: RH STG MANAGE BLADDER WITH ASSISTANCE Description: STG Manage Bladder With Assistance 07/30/2023 1221 by Jenetta Misty, RN Outcome: Progressing 07/30/2023 1221 by Jenetta Misty, RN Outcome: Progressing Goal: RH STG MANAGE BLADDER WITH MEDICATION WITH ASSISTANCE Description: STG Manage Bladder With Medication With mod I Assistance. 07/30/2023 1221 by Jenetta Misty, RN Outcome: Progressing 07/30/2023 1221 by Jenetta Misty, RN Outcome: Progressing   Problem: RH SKIN INTEGRITY Goal: RH STG SKIN FREE OF INFECTION/BREAKDOWN Description: Manage skin/wounds w min assist 07/30/2023 1221 by Jenetta Misty, RN Outcome: Progressing 07/30/2023 1221 by Jenetta Misty, RN Outcome: Progressing Goal: RH STG MAINTAIN SKIN INTEGRITY WITH ASSISTANCE Description: STG Maintain Skin Integrity With min Assistance. Outcome: Progressing Goal: RH STG ABLE TO PERFORM INCISION/WOUND CARE W/ASSISTANCE Description: STG Able To Perform Incision/Wound Care With min Assistance. Outcome: Progressing   Problem: RH SAFETY Goal: RH STG ADHERE TO SAFETY PRECAUTIONS W/ASSISTANCE/DEVICE Description: STG Adhere to Safety Precautions With cues  Assistance/Device. Outcome: Progressing   Problem: RH PAIN MANAGEMENT Goal: RH STG PAIN MANAGED AT OR BELOW PT'S PAIN GOAL Description: < 4 with prns Outcome: Progressing   Problem: RH KNOWLEDGE DEFICIT GENERAL Goal: RH STG INCREASE KNOWLEDGE OF SELF CARE AFTER HOSPITALIZATION Description: Patient and family will be able to manage care at discharge using educational resources independently Outcome: Progressing

## 2023-07-30 NOTE — Progress Notes (Signed)
 PROGRESS NOTE   Subjective/Complaints: Left VM for wife to discuss her wound care questions Patient has no new complaints this morning Says dressing has not been changed, requested nursing to change today  ROS: Patient denies fever, new vision changes, dizziness, nausea, vomiting, diarrhea,  shortness of breath or chest pain, headache, or mood change. +RLE swelling, +Left big toe ulceration    Objective:   No results found. Recent Labs    07/28/23 0520  WBC 9.4  HGB 11.7*  HCT 37.6*  PLT 478*   Recent Labs    07/28/23 0520  NA 136  K 4.1  CL 104  CO2 24  GLUCOSE 105*  BUN 10  CREATININE 0.89  CALCIUM  8.5*    Intake/Output Summary (Last 24 hours) at 07/30/2023 1202 Last data filed at 07/30/2023 0800 Gross per 24 hour  Intake 476 ml  Output 800 ml  Net -324 ml        Physical Exam: Vital Signs Blood pressure 112/71, pulse 65, temperature 98 F (36.7 C), resp. rate 18, height 6' (1.829 m), weight 93.7 kg, SpO2 98%.   General: Shaving with OT HEENT: Head is normocephalic, atraumatic, PERRLA, EOMI, sclera anicteric, oral mucosa pink and moist Neck: Supple without JVD or lymphadenopathy Heart: Reg rate and rhythm. Chest: CTA bilaterally without wheezes, rales, or rhonchi; no distress Abdomen: Soft, non-tender, non-distended, bowel sounds positive. Psych: Pt's affect is appropriate. Pt is cooperative Skin: Pt with 2 incisions along left calf with minimal serous drainage. Healing wounds/scabs on 1st MT and great toe of left foot. Right achilles area with linear wound with minimal serous drainage/granulation, measuring about 4cm x 1cm in area, almost closed.   MSK:    Cervical back: Normal range of motion.     Comments: Pt's left leg is tender to palpation. Only trace edema bilateral LE's.    Neuro:     Comments: Alert and oriented x 3. Normal insight and awareness. Intact Memory. Normal language and  speech. Cranial nerve exam unremarkable. MMT: BUE grossly 4/5 prox to distal. BLE 3+HF and KE and 4- ADF/PF. Mild sensory loss distally in both legs but otherwise sensory exam appears intact. DTR's 1+. No abnl resting tone.    Stable 5/16     Assessment/Plan: 1. Functional deficits which require 3+ hours per day of interdisciplinary therapy in a comprehensive inpatient rehab setting. Physiatrist is providing close team supervision and 24 hour management of active medical problems listed below. Physiatrist and rehab team continue to assess barriers to discharge/monitor patient progress toward functional and medical goals  Care Tool:  Bathing    Body parts bathed by patient: Right arm, Left arm, Chest, Abdomen, Front perineal area, Right upper leg, Left upper leg, Face   Body parts bathed by helper: Buttocks, Right lower leg Body parts n/a: Left lower leg   Bathing assist Assist Level: Minimal Assistance - Patient > 75%     Upper Body Dressing/Undressing Upper body dressing   What is the patient wearing?: Pull over shirt    Upper body assist Assist Level: Set up assist    Lower Body Dressing/Undressing Lower body dressing      What is the  patient wearing?: Incontinence brief, Pants     Lower body assist Assist for lower body dressing: Minimal Assistance - Patient > 75%     Toileting Toileting Toileting Activity did not occur Press photographer and hygiene only): N/A (no void or bm)  Toileting assist Assist for toileting: Maximal Assistance - Patient 25 - 49%     Transfers Chair/bed transfer  Transfers assist  Chair/bed transfer activity did not occur: Safety/medical concerns  Chair/bed transfer assist level: Contact Guard/Touching assist Chair/bed transfer assistive device: Geologist, engineering   Ambulation assist      Assist level: Contact Guard/Touching assist Assistive device: Walker-rolling Max distance: 165   Walk 10 feet  activity   Assist     Assist level: Contact Guard/Touching assist Assistive device: Walker-rolling   Walk 50 feet activity   Assist    Assist level: Contact Guard/Touching assist Assistive device: Walker-rolling    Walk 150 feet activity   Assist Walk 150 feet activity did not occur: Safety/medical concerns (fatigue, discomofort in LLE)  Assist level: Contact Guard/Touching assist Assistive device: Walker-rolling    Walk 10 feet on uneven surface  activity   Assist     Assist level: Minimal Assistance - Patient > 75% Assistive device: Walker-rolling   Wheelchair     Assist Is the patient using a wheelchair?: Yes Type of Wheelchair: Manual    Wheelchair assist level: Supervision/Verbal cueing Max wheelchair distance: 165    Wheelchair 50 feet with 2 turns activity    Assist        Assist Level: Supervision/Verbal cueing   Wheelchair 150 feet activity     Assist      Assist Level: Supervision/Verbal cueing   Blood pressure 112/71, pulse 65, temperature 98 F (36.7 C), resp. rate 18, height 6' (1.829 m), weight 93.7 kg, SpO2 98%.  Medical Problem List and Plan: 1. Functional deficits secondary to sepsis secondary to cellulitis.  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  Plan follow-up Dopplers 4 to 6 weeks.             -patient may  shower if left leg is covered             -ELOS/Goals: 7-10 days, supervision to mod I with PT and sup/min with OT  Left VM today to answer wife's questions  2.  Antithrombotics: -DVT/anticoagulation:  Mechanical: Antiembolism stockings, thigh (TED hose) Bilateral lower extremities             -antiplatelet therapy: Plavix  75 mg daily 3. Pain Management: Oxycodone  as needed             -pain seems fairly controlled 4. Mood/Behavior/Sleep: Provide emotional support             -antipsychotic agents: N/A 5. Neuropsych/cognition: This patient  is capable of making decisions on his own behalf. 6. Wounds on both legs appear to be healing nicely.              -continue dry dressings with kerlix to left calf and foot as well as right achilles area, asked nursing to change dressing today  -5/14 seen by WOC- for leg wounds 7. Fluids/Electrolytes/Nutrition: Routine in and outs with follow-up chemistries 8.  ID/cellulitis..  Completing course of Augmentin /doxycycline  9.  AKI.  Resolved.  Follow-up chemistries  - 5/14 BUN/creatinine stable 10.  Diabetes mellitus.  Hemoglobin A1c 6.4. d/c ISS CBG (last 3)  Recent Labs    07/29/23 1702  07/29/23 2156 07/30/23 0623  GLUCAP 101* 132* 100*    11.  Hypertension.  Norvasc 2.5 mg daily.  Monitor with increased mobility                 07/30/2023    9:26 AM 07/30/2023    4:45 AM 07/29/2023    6:08 PM  Vitals with BMI  Systolic 112 133 324  Diastolic 71 76 80  Pulse 65 60 64    12.  History of prostate cancer.  Continue Proscar  5 mg daily  13.  Atrial fibrillation.  Cardiac rate controlled.  continue Librexia trial drug per cardiology services as well as Toprol -XL 12.5 mg every 12 hours  14.  Hyperlipidemia.  continue Crestor /Zetia   15.  Elevated LFTs: d/c tylenol   LOS: 3 days A FACE TO FACE EVALUATION WAS PERFORMED  Logan French P Genene Kilman 07/30/2023, 12:02 PM

## 2023-07-30 NOTE — IPOC Note (Signed)
 Overall Plan of Care [facility]) Patient Details Name: Logan French MRN: [id number] DOB: 06-25-1934  Admitting Diagnosis: Cellulitis  Hospital Problems: Principal Problem:   Cellulitis     Functional Problem List: Nursing Bowel, Safety, Pain, Edema, Endurance, Medication Management, Skin Integrity  PT Balance, Edema, Endurance, Nutrition, Skin Integrity  OT Balance, Motor, Skin Integrity, Pain, Edema, Endurance, Safety  SLP    TR         Basic ADL's: OT Bathing, Dressing, Toileting     Advanced  ADL's: OT       Transfers: PT Bed Mobility, Bed to Chair, Car, Lobbyist, Technical brewer: PT Ambulation, Psychologist, prison and probation services, Stairs     Additional Impairments: OT None  SLP        TR      Anticipated Outcomes Item Anticipated Outcome  Self Feeding Independent  Swallowing      Basic self-care  Min A LB  Toileting  Mod I   Bathroom Transfers Superv  Bowel/Bladder  manage bowel and bladder w mod I assist  Transfers  Mod I with LRAD  Locomotion  supervision with LRAD  Communication     Cognition     Pain  Pain < 4 with prns  Safety/Judgment  manage safety w cues   Therapy Plan: PT Intensity: Minimum of 1-2 x/day ,45 to 90 minutes PT Frequency: 5 out of 7 days PT Duration Estimated Length of Stay: 7-10 days OT Intensity: Minimum of 1-2 x/day, 45 to 90 minutes OT Frequency: 5 out of 7 days OT Duration/Estimated Length of Stay: 7-10 days     Team Interventions: Nursing Interventions Patient/Family Education, Pain Management, Medication Management, Bladder Management, Bowel Management, Discharge Planning, Skin Care/Wound Management, Disease Management/Prevention  PT interventions Ambulation/gait training, Cognitive remediation/compensation, Discharge planning, DME/adaptive equipment instruction, Functional mobility training, Pain management, Psychosocial support, Therapeutic Activities, Splinting/orthotics, UE/LE Strength  taining/ROM, Visual/perceptual remediation/compensation, Wheelchair propulsion/positioning, UE/LE Coordination activities, Therapeutic Exercise, Stair training, Skin care/wound management, Patient/family education, Neuromuscular re-education, Functional electrical stimulation, Disease management/prevention, Firefighter, Warden/ranger  OT Interventions Warden/ranger, Discharge planning, Pain management, Therapeutic Activities, Self Care/advanced ADL retraining, UE/LE Coordination activities, Disease mangement/prevention, Functional mobility training, Patient/family education, Skin care/wound managment, Therapeutic Exercise, DME/adaptive equipment instruction, Neuromuscular re-education, Splinting/orthotics, UE/LE Strength taining/ROM, Wheelchair propulsion/positioning  SLP Interventions    TR Interventions    SW/CM Interventions Discharge Planning, Psychosocial Support, Patient/Family Education   Barriers to Discharge MD  Medical stability  Nursing Decreased caregiver support, Home environment access/layout Multi level 2 ste, 2 step down to living/kitchen area, main B+B w spouse; son to assist  PT Home environment access/layout, Decreased caregiver support, Wound Care stairs, discomfort in LLE, fatigue, global weakness/deconditioning  OT Inaccessible home environment, Home environment access/layout, Incontinence, Wound Care    SLP      SW Decreased caregiver support, Lack of/limited family support     Team Discharge Planning: Destination: PT-Home ,OT- Home , SLP-  Projected Follow-up: PT-Outpatient PT, OT-  None, SLP-  Projected Equipment Needs: PT-To be determined, OT- None recommended by OT, SLP-  Equipment Details: PT- , OT-Has equipment from recent rehab stay Patient/family involved in discharge planning: PT- Patient,  OT-Patient, SLP-   MD ELOS: 7-10 days Medical Rehab Prognosis:  Excellent Assessment: The patient has been admitted for CIR  therapies with the diagnosis of sepsis 2/2 cellulitis. The team will be addressing functional mobility, strength, stamina, balance, safety, adaptive techniques and equipment, self-care, bowel and bladder mgt, patient and  caregiver education. Goals have been set at S/modi. Anticipated discharge destination is home.        See Team Conference Notes for weekly updates to the plan of care

## 2023-07-30 NOTE — Progress Notes (Signed)
 Occupational Therapy Session Note  Patient Details  Name: Logan French MRN: [id number] Date of Birth: 1934/12/04  Today's Date: 07/30/2023 OT Individual Time: 1107-1200 & 4098-1191 OT Individual Time Calculation (min): 53 min & 70 min   Short Term Goals: Week 1:  OT Short Term Goal 1 (Week 1): Pt will complete sit > stand in prep for ADL with CGA using LRAD OT Short Term Goal 2 (Week 1): Pt will engage in ADL task at sink in stance for 3 mins with min A to promote standing tolerance OT Short Term Goal 3 (Week 1): Pt will utilize AE PRN to thread LB clothing with supervision  Skilled Therapeutic Interventions/Progress Updates:  Session 1 Skilled OT intervention completed with focus on sleep/recovery education, activity tolerance, functional transfers, standing tolerance and dynamic standing balance. Pt received seated in w/c with BLE elevated on EOB, agreeable to session. Unrated pain reported in L calf; nurse notified for pain meds. OT offered rest breaks, repositioning and applied topical ice to back of calf at conclusion of session for pain reduction.  Pt initially perseverative on his dissatisfaction with nursing not laying him down when asked. Pt felt that for two days, he has needed to lay down for rest in between therapies, but has been told to sit up. Asked pt his sleep schedule at baseline, which differs greatly from his report of on/off sleeping for long cycles during the night. Encouraged pt to try to stay awake during day, for longer/more efficient sleep cycles at night. Also offered pt the option to try laying in recliner in between therapies to minimize transition time OOB, with additional BLE elevation benefit however pt fixated on needing "to rest the eyes while flat." Nurse notified of pt's complaints and offered to leave time at end of session to return pt to bed in prep for PM sessions.  Transported dependently in w/c <> gym for energy conservation. Pt participated in the  following activities in standing to address standing tolerance and dynamic standing balance needed for independence and safety with BADL management: -Motor speed dot test- 1st trial 2.55 sec, 2nd trial 2.56 sec; results indicative of slightly slower motor processing in peripheral regions -Bell cancellation test- 3:25 min; 3 misses in peripheral regions Pt required CGA for x5 sit <> stands with pt using BUE on arm rest to power up to RW. Intermittent seated rest needed for fatigue and onset of LLE pain.  Back in room, pt stood with close supervision and RW, ambulated 15 ft with CGA and RW > EOB. Supervision sit > supine transition. Education provided on gentle AROM pt can complete on LLE > RLE for decreasing pain with "stretching" that is felt when in standing, including dorsiflexion, knee flexion, and hip abduction exercises.  Pt remained semi upright in bed with MD present for rounds, with bed alarm on/activated, and with all needs in reach at end of session.  Session 2 Skilled OT intervention completed with focus on ADL retraining, activity tolerance, ambulatory endurance, BLE stretching, dynamic standing balance. Pt received EOB, agreeable to session. 3/10 pain reported in L calf with mobility and WB; nurse notified and issued meds towards end of session. OT offered rest breaks, repositioning and stretching for pain reduction.  Pt requested to trial velcro slippers for ambulation. Pt frequently asking OT do for him, however with encouragement to trial for DC independence, pt was able to donn bilaterally with min A for LLE only. Cues needed for technique/efficiency.  Pt stood with CGA  using RW, then CGA for continent urinary void with urinal and pants management. Ambulated with CGA using RW about 500 ft to gym, with intermittent cues for upright gaze, decreasing WB of BUE on RW, and maintaining RW proximity.   Seated EOM, pt completed the following stretches to reduce stiffness related pain in  LLE: -with LLE extended, walking L hand down L leg for hamstring stretch x15 with 5 sec hold at end range -dorsiflexion x30  Pt participated in the following dynamic standing balance and endurance tasks to promote independence and safety during BADLs and functional mobility: -alternating toe taps to colored/numbered dots as called out by therapist to incorporate dual tasking component. CGA sit > stand with RW x3, and CGA needed for balance, especially when tapping with either LE behind him in tandem stance  Pt ambulated about 100 ft total using RW with CGA to retrieve cones from around gym to simulate home environment item retrieval. Pt missed 1 cone in L peripheral. Pt remained seated in w/c in gym awaiting prompt PT session and PT aware, with all needs met at end of session.    Therapy Documentation Precautions:  Precautions Precautions: Fall, Other (comment) Precaution/Restrictions Comments: bilat foot wounds Restrictions Weight Bearing Restrictions Per Provider Order: No    Therapy/Group: Individual Therapy  Ruthanna Covert, MS, OTR/L  07/30/2023, 2:19 PM

## 2023-07-31 NOTE — Progress Notes (Signed)
 PROGRESS NOTE   Subjective/Complaints: Patient reports he feels well this morning.  Says he slept well.  Denies pain, says he has discomfort but it is under control.  Reports bowel movement today.  ROS: Patient denies fever, chills, shortness of breath, chest pain, abdominal pain. +RLE swelling, +Left big toe ulceration    Objective:   No results found. No results for input(s): "WBC", "HGB", "HCT", "PLT" in the last 72 hours.  No results for input(s): "NA", "K", "CL", "CO2", "GLUCOSE", "BUN", "CREATININE", "CALCIUM " in the last 72 hours.   Intake/Output Summary (Last 24 hours) at 07/31/2023 1054 Last data filed at 07/31/2023 0700 Gross per 24 hour  Intake 238 ml  Output 1000 ml  Net -762 ml        Physical Exam: Vital Signs Blood pressure 125/73, pulse 63, temperature 98.5 F (36.9 C), resp. rate 18, height 6' (1.829 m), weight 93.7 kg, SpO2 98%.   General: Sitting in chair, NAD HEENT: Head is normocephalic, atraumatic, PERRLA, EOMI, sclera anicteric, oral mucosa pink and moist Neck: Supple without JVD or lymphadenopathy Heart: Reg rate and rhythm. Chest: CTA bilaterally without wheezes, rales, or rhonchi; no distress Abdomen: Soft, non-tender, non-distended, bowel sounds positive. Psych: Pt's affect is appropriate. Pt is cooperative Skin: Pt with 2 incisions along left calf with minimal serous drainage. Healing wounds/scabs on 1st MT and great toe of left foot. Right achilles area with linear wound with minimal serous drainage/granulation, measuring about 4cm x 1cm in area, almost closed.   MSK:    Cervical back: Normal range of motion.     Comments: Pt's left leg is tender to palpation. Only trace edema bilateral LE's.    Neuro:     Comments: Alert and oriented x 3. Normal insight and awareness. Intact Memory. Normal language and speech. Cranial nerve exam unremarkable. MMT: BUE grossly 4/5 prox to distal. BLE  3+HF and KE and 4- ADF/PF. Mild sensory loss distally in both legs but otherwise sensory exam appears intact. DTR's 1+. No abnl resting tone.    Stable 5/17     Assessment/Plan: 1. Functional deficits which require 3+ hours per day of interdisciplinary therapy in a comprehensive inpatient rehab setting. Physiatrist is providing close team supervision and 24 hour management of active medical problems listed below. Physiatrist and rehab team continue to assess barriers to discharge/monitor patient progress toward functional and medical goals  Care Tool:  Bathing    Body parts bathed by patient: Right arm, Left arm, Chest, Abdomen, Front perineal area, Right upper leg, Left upper leg, Face   Body parts bathed by helper: Buttocks, Right lower leg Body parts n/a: Left lower leg   Bathing assist Assist Level: Minimal Assistance - Patient > 75%     Upper Body Dressing/Undressing Upper body dressing   What is the patient wearing?: Pull over shirt    Upper body assist Assist Level: Set up assist    Lower Body Dressing/Undressing Lower body dressing      What is the patient wearing?: Incontinence brief, Pants     Lower body assist Assist for lower body dressing: Minimal Assistance - Patient > 75%     Financial trader  Activity did not occur (Clothing management and hygiene only): N/A (no void or bm)  Toileting assist Assist for toileting: Maximal Assistance - Patient 25 - 49%     Transfers Chair/bed transfer  Transfers assist  Chair/bed transfer activity did not occur: Safety/medical concerns  Chair/bed transfer assist level: Contact Guard/Touching assist Chair/bed transfer assistive device: Geologist, engineering   Ambulation assist      Assist level: Contact Guard/Touching assist Assistive device: Walker-rolling Max distance: 165   Walk 10 feet activity   Assist     Assist level: Contact Guard/Touching assist Assistive device:  Walker-rolling   Walk 50 feet activity   Assist    Assist level: Contact Guard/Touching assist Assistive device: Walker-rolling    Walk 150 feet activity   Assist Walk 150 feet activity did not occur: Safety/medical concerns (fatigue, discomofort in LLE)  Assist level: Contact Guard/Touching assist Assistive device: Walker-rolling    Walk 10 feet on uneven surface  activity   Assist     Assist level: Minimal Assistance - Patient > 75% Assistive device: Development worker, international aid     Assist Is the patient using a wheelchair?: Yes Type of Wheelchair: Manual    Wheelchair assist level: Supervision/Verbal cueing Max wheelchair distance: 165    Wheelchair 50 feet with 2 turns activity    Assist        Assist Level: Supervision/Verbal cueing   Wheelchair 150 feet activity     Assist      Assist Level: Supervision/Verbal cueing   Blood pressure 125/73, pulse 63, temperature 98.5 F (36.9 C), resp. rate 18, height 6' (1.829 m), weight 93.7 kg, SpO2 98%.  Medical Problem List and Plan: 1. Functional deficits secondary to sepsis secondary to cellulitis.  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  Plan follow-up Dopplers 4 to 6 weeks.             -patient may  shower if left leg is covered             -ELOS/Goals: 7-10 days, supervision to mod I with PT and sup/min with OT  -Continue CIR  2.  Antithrombotics: -DVT/anticoagulation:  Mechanical: Antiembolism stockings, thigh (TED hose) Bilateral lower extremities             -antiplatelet therapy: Plavix  75 mg daily 3. Pain Management: Oxycodone  as needed             -pain seems fairly controlled 4. Mood/Behavior/Sleep: Provide emotional support             -antipsychotic agents: N/A 5. Neuropsych/cognition: This patient is capable of making decisions on his own behalf. 6. Wounds on both legs appear to be healing nicely.               -continue dry dressings with kerlix to left calf and foot as well as right achilles area, asked nursing to change dressing today  -5/14 seen by [facility]- for leg wounds 7. Fluids/Electrolytes/Nutrition: Routine in and outs with follow-up chemistries 8.  ID/cellulitis..  Completing course of Augmentin /doxycycline  9.  AKI.  Resolved.  Follow-up chemistries  - 5/14 BUN/creatinine stable 10.  Diabetes mellitus.  Hemoglobin A1c 6.4. d/c ISS  -5/17 monitor on BMP CBG (last 3)  Recent Labs    07/29/23 1702 07/29/23 2156 07/30/23 0623  GLUCAP 101* 132* 100*    11.  Hypertension.  Norvasc  2.5 mg daily.  Monitor with increased mobility  -5/17 vitals stable  07/31/2023    8:12 AM 07/31/2023    5:09 AM 07/30/2023    8:09 PM  Vitals with BMI  Systolic  125 131  Diastolic  73 68  Pulse 63 63 60    12.  History of prostate cancer.  Continue Proscar  5 mg daily  13.  Atrial fibrillation.  Cardiac rate controlled.  continue Librexia trial drug per cardiology services as well as Toprol -XL 12.5 mg every 12 hours  -HR stable 5/17  14.  Hyperlipidemia.  continue Crestor /Zetia   15.  Elevated LFTs: d/c tylenol   LOS: 4 days A FACE TO FACE EVALUATION WAS PERFORMED  Lylia Sand 07/31/2023, 10:54 AM

## 2023-07-31 NOTE — Progress Notes (Signed)
 Occupational Therapy Session Note  Patient Details  Name: Logan French MRN: [id number] Date of Birth: [birth year]  Today's Date: 07/31/2023 OT Individual Time: 0900-1000 OT Individual Time Calculation (min): 60 min    Short Term Goals: Week 1:  OT Short Term Goal 1 (Week 1): Pt will complete sit > stand in prep for ADL with CGA using LRAD OT Short Term Goal 2 (Week 1): Pt will engage in ADL task at sink in stance for 3 mins with min A to promote standing tolerance OT Short Term Goal 3 (Week 1): Pt will utilize AE PRN to thread LB clothing with supervision  Skilled Therapeutic Interventions/Progress Updates:     (1st) Occupational Therapy Treatment Session: Patient seated at w/c LOF at the time of treatment with family present. Patient indicated that he rested well with no c/o pain.  Patient indicated that he completed a BADL related task earlier. The pt began the session by transferring his feet from on the bed to placing them on the floor with MinA.  The pt went on to complete UB exercise using a 2lb dowel for 2 sets of 15 for shld flexion, shld rotation, horizontal abduction, and lg circles. The pt required 2 rest breaks. The pt went on to complete sit to stand with vc's for arm placement and leg position 2x for a count of 10 with rest breaks to follow, the pt required 2 rest breaks. The pt went to complete the UB cycle while seated w/c LOF for 10 minutes in duration The pt completed the session by washing his face and hands at chair LOF with s/uA. At the end of the session the pt feet was placed on the bed with MinA  with all additional needs addressed prior to exiting the room.     (2nd) Occupational Therapy Treatment Session:  Patient seated at w/c LOF at the time of arrival.  The pt expressed a desire to complete a BADL related task in showering to SLP.  The pt was able to transfer from bed LOF to EOB with close S..  The pt was able to transfer from EOB for abulating to the shower  using the RW with MinA.  The pt was able to ambulate to the shower using the RW with CGA.  The pt was able to go from standing to sitting on the shower chair using the grab bars with close S and vc's.  The pt was able to doff his over head shirt with close S. The pt was able to doff his brief, pants, and socks with MinA.  The pt was able to bathe UB/LB with close S. The pt was able to donn his over head shirt with MinA, he was ModA for donning his brief and pants. The pt was ModA for donning his non-skid socks. The pt was able to ambulated to the bed using the RW with MinA and he was MinA for transitioning from standing to sitting EOB. The pt was able to trasfer his feet onto the bed for supine in bed with MinA with additional time. The call light and bed side table were placed within reach with all additional needs addressed.  Therapy Documentation Precautions:  Precautions Precautions: Fall, Other (comment) Precaution/Restrictions Comments: bilat foot wounds Restrictions Weight Bearing Restrictions Per Provider Order: No   Therapy/Group: Individual Therapy  Moises Ang 07/31/2023, 10:03 AM

## 2023-07-31 NOTE — Plan of Care (Signed)
  Problem: Consults Goal: [facility] GENERAL PATIENT EDUCATION Description: See Patient Education module for education specifics. Outcome: Progressing   

## 2023-07-31 NOTE — Progress Notes (Signed)
 Physical Therapy Session Note  Patient Details  Name: Logan French MRN: [id number] Date of Birth: 11/19/1934  Today's Date: 07/31/2023 PT Individual Time: 1110-1215 PT Individual Time Calculation (min): 65 min   Short Term Goals: Week 1:  PT Short Term Goal 1 (Week 1): STG=LTG due to LOS  Skilled Therapeutic Interventions/Progress Updates:    Pt presents in room in bed, agreeable to PT. Pt reporting no pain at rest but states has pain in LLE with LLE in dependent position. Pt declines intervention at this time. Session focused on gait training for improving RW management and step mechanics as well as tolerance to upright, NMR for BLE muscle fiber recruitment, postural stability in standing, and single limb stability,and therapeutic activities for sequencing and participating with self care tasks. Pt provided with rest breaks as needed for pain and fatigue. Pt completes bed mobility with supervision, completes transfers with CGA/supervision throughout session with RW. Pt ambulates from room to main gym 150' with RW with pt demonstrating step to gait pattern and decreased proximity to RW, cues to correct with pt demonstrating minimal ability to correct, reports pain in weightbearing in L calf secondary to wound. Pt completes x15 sit<>stand from EOM to RW with supervision, improving body mechanics and power production with each stand. Pt then completes NMR in standing including: - step taps 4" step x20 alternating BLE - step ups x5 BLE BUEs on handrail (CGA) - standing overhead press with physioball x15 - standing palloff press with physioball x15 Pt then completes gait training ambulating over hurdles (handweights placed on ground, ~3" in height) x4 trials step to gait pattern leading with LLE initially, progress to leading with RLE with cues for gait mechanics as pt tending to rotate hips to L to complete diagonal step over hurdles. Pt then completes x2 trials completing reciprocal gait over  hurdles, improving mechanics with decreased compensatory strategies. Pt ambulates back to room 150' with RW CGA with pt demonstrating improved proximity to RW and step through gait pattern. Pt ambulates within room navigating obstacles and door into bathroom to complete toilet transfer, pt able to complete dressing management in standing with close supervision for doffing, min assist for donning with pt able to bend over to pick up clothing from floor with RW for LUE support and CGA. Pt continent of urine, charted, and completes perirarea hygiene with supervision. Pt able to ambulate to sink and completes hand hygiene with supervision, with education on safe hygiene practices. Pt returns to sitting on EOB, completes bed mobility with supervision, and able to reposition self with supervision and assist with hospital bed features. Pt remains supine with all needs within reach, call light in place, bed alarm activated at end of session.  Therapy Documentation Precautions:  Precautions Precautions: Fall, Other (comment) Precaution/Restrictions Comments: bilat foot wounds Restrictions Weight Bearing Restrictions Per Provider Order: No   Therapy/Group: Individual Therapy  Annia Kilts PT, DPT 07/31/2023, 12:24 PM

## 2023-08-01 NOTE — Plan of Care (Signed)
  Problem: Consults Goal: RH GENERAL PATIENT EDUCATION Description: See Patient Education module for education specifics. Outcome: Progressing   Problem: RH BOWEL ELIMINATION Goal: RH STG MANAGE BOWEL WITH ASSISTANCE Description: STG Manage Bowel with toileting Assistance. Outcome: Progressing Goal: RH STG MANAGE BOWEL W/MEDICATION W/ASSISTANCE Description: STG Manage Bowel with Medication with mod I Assistance. Outcome: Progressing   Problem: RH BLADDER ELIMINATION Goal: RH STG MANAGE BLADDER WITH ASSISTANCE Description: STG Manage Bladder With Assistance Outcome: Progressing Goal: RH STG MANAGE BLADDER WITH MEDICATION WITH ASSISTANCE Description: STG Manage Bladder With Medication With mod I Assistance. Outcome: Progressing   Problem: RH SKIN INTEGRITY Goal: RH STG SKIN FREE OF INFECTION/BREAKDOWN Description: Manage skin/wounds w min assist Outcome: Progressing Goal: RH STG MAINTAIN SKIN INTEGRITY WITH ASSISTANCE Description: STG Maintain Skin Integrity With min Assistance. Outcome: Progressing Goal: RH STG ABLE TO PERFORM INCISION/WOUND CARE W/ASSISTANCE Description: STG Able To Perform Incision/Wound Care With min Assistance. Outcome: Progressing   Problem: RH SAFETY Goal: RH STG ADHERE TO SAFETY PRECAUTIONS W/ASSISTANCE/DEVICE Description: STG Adhere to Safety Precautions With cues Assistance/Device. Outcome: Progressing   Problem: RH PAIN MANAGEMENT Goal: RH STG PAIN MANAGED AT OR BELOW PT'S PAIN GOAL Description: < 4 with prns Outcome: Progressing   Problem: RH KNOWLEDGE DEFICIT GENERAL Goal: RH STG INCREASE KNOWLEDGE OF SELF CARE AFTER HOSPITALIZATION Description: Patient and family will be able to manage care at discharge using educational resources independently Outcome: Progressing

## 2023-08-01 NOTE — Progress Notes (Addendum)
 PROGRESS NOTE   Subjective/Complaints: PT reports pain is controlled, only has discomfort when moving.  Reports slept ok.  LBM yesterday.  Continues to have small amount of serous drainage L calf.   ROS: Patient denies fever, chills, shortness of breath, chest pain, abdominal pain. +RLE swelling, +Left big toe ulceration    Objective:   No results found. No results for input(s): "WBC", "HGB", "HCT", "PLT" in the last 72 hours.  No results for input(s): "NA", "K", "CL", "CO2", "GLUCOSE", "BUN", "CREATININE", "CALCIUM " in the last 72 hours.   Intake/Output Summary (Last 24 hours) at 08/01/2023 1205 Last data filed at 08/01/2023 0900 Gross per 24 hour  Intake 238 ml  Output 1150 ml  Net -912 ml        Physical Exam: Vital Signs Blood pressure 125/80, pulse (!) 59, temperature 98 F (36.7 C), resp. rate 18, height 6' (1.829 m), weight 93.7 kg, SpO2 99%.   General: Sitting in chair, NAD HEENT: Head is normocephalic, atraumatic, PERRLA, EOMI, sclera anicteric, oral mucosa pink and moist Neck: Supple without JVD or lymphadenopathy Heart: Reg rate and rhythm. Chest: CTA bilaterally without wheezes, rales, or rhonchi; no distress Abdomen: Soft, non-tender, non-distended, bowel sounds positive. Psych: Pt's affect is appropriate. Pt is cooperative Skin:  Left calf and R Achilles region with dry bordered foam dressings in place  MSK:    Cervical back: Normal range of motion.     Comments: Pt's left leg is tender to palpation. Only trace edema bilateral LE's.    Neuro:     Comments: Alert and oriented x 3. Normal insight and awareness. Intact Memory. Normal language and speech. Cranial nerve exam unremarkable. MMT: BUE grossly 4/5 prox to distal. BLE 3+HF and KE and 4- ADF/PF. Mild sensory loss distally in both legs but otherwise sensory exam appears intact. DTR's 1+. No abnl resting tone.    Stable 5/18           Assessment/Plan: 1. Functional deficits which require 3+ hours per day of interdisciplinary therapy in a comprehensive inpatient rehab setting. Physiatrist is providing close team supervision and 24 hour management of active medical problems listed below. Physiatrist and rehab team continue to assess barriers to discharge/monitor patient progress toward functional and medical goals  Care Tool:  Bathing    Body parts bathed by patient: Right arm, Left arm, Chest, Abdomen, Front perineal area, Right upper leg, Left upper leg, Face   Body parts bathed by helper: Buttocks, Right lower leg Body parts n/a: Left lower leg   Bathing assist Assist Level: Minimal Assistance - Patient > 75%     Upper Body Dressing/Undressing Upper body dressing   What is the patient wearing?: Pull over shirt    Upper body assist Assist Level: Set up assist    Lower Body Dressing/Undressing Lower body dressing      What is the patient wearing?: Incontinence brief, Pants     Lower body assist Assist for lower body dressing: Minimal Assistance - Patient > 75%     Toileting Toileting Toileting Activity did not occur (Clothing management and hygiene only): N/A (no void or bm)  Toileting assist Assist for toileting: Maximal Assistance -  Patient 25 - 49%     Transfers Chair/bed transfer  Transfers assist  Chair/bed transfer activity did not occur: Safety/medical concerns  Chair/bed transfer assist level: Contact Guard/Touching assist Chair/bed transfer assistive device: Geologist, engineering   Ambulation assist      Assist level: Contact Guard/Touching assist Assistive device: Walker-rolling Max distance: 150'   Walk 10 feet activity   Assist     Assist level: Contact Guard/Touching assist Assistive device: Walker-rolling   Walk 50 feet activity   Assist    Assist level: Contact Guard/Touching assist Assistive device: Walker-rolling    Walk 150 feet  activity   Assist Walk 150 feet activity did not occur: Safety/medical concerns (fatigue, discomofort in LLE)  Assist level: Contact Guard/Touching assist Assistive device: Walker-rolling    Walk 10 feet on uneven surface  activity   Assist     Assist level: Minimal Assistance - Patient > 75% Assistive device: Walker-rolling   Wheelchair     Assist Is the patient using a wheelchair?: Yes Type of Wheelchair: Manual    Wheelchair assist level: Supervision/Verbal cueing Max wheelchair distance: 165    Wheelchair 50 feet with 2 turns activity    Assist        Assist Level: Supervision/Verbal cueing   Wheelchair 150 feet activity     Assist      Assist Level: Supervision/Verbal cueing   Blood pressure 125/80, pulse (!) 59, temperature 98 F (36.7 C), resp. rate 18, height 6' (1.829 m), weight 93.7 kg, SpO2 99%.  Medical Problem List and Plan: 1. Functional deficits secondary to sepsis secondary to cellulitis.  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  Plan follow-up Dopplers 4 to 6 weeks.             -patient may  shower if left leg is covered             -ELOS/Goals: 7-10 days, supervision to mod I with PT and sup/min with OT  -Continue CIR  2.  Antithrombotics: -DVT/anticoagulation:  Mechanical: Antiembolism stockings, thigh (TED hose) Bilateral lower extremities             -antiplatelet therapy: Plavix  75 mg daily 3. Pain Management: Oxycodone  as needed             -pain seems fairly controlled 4. Mood/Behavior/Sleep: Provide emotional support             -antipsychotic agents: N/A 5. Neuropsych/cognition: This patient is capable of making decisions on his own behalf. 6. Wounds on both legs appear to be healing nicely.              -continue dry dressings with kerlix to left calf and foot as well as right achilles area, asked nursing to change dressing today  -5/14 seen by [facility]- for  leg wounds  -5/18 will ask nursing to get update images when dressings changed for chart 7. Fluids/Electrolytes/Nutrition: Routine in and outs with follow-up chemistries 8.  ID/cellulitis..  Completing course of Augmentin /doxycycline  9.  AKI.  Resolved.  Follow-up chemistries  - 5/14 BUN/creatinine stable  Recheck tomorrow 10.  Diabetes mellitus.  Hemoglobin A1c 6.4. d/c ISS  - Check on BMP tomorrow CBG (last 3)  Recent Labs    07/29/23 1702 07/29/23 2156 07/30/23 0623  GLUCAP 101* 132* 100*    11.  Hypertension.  Norvasc  2.5 mg daily.  Monitor with increased mobility  -5/18 BP overall stable although had few soft  BP, continue to monitor trend.                   08/01/2023    3:51 AM 07/31/2023    8:21 PM 07/31/2023    2:03 PM  Vitals with BMI  Systolic 125 125 409  Diastolic 80 57 56  Pulse 59 65 64    12.  History of prostate cancer.  Continue Proscar  5 mg daily  13.  Atrial fibrillation.  Cardiac rate controlled.  continue Librexia trial drug per cardiology services as well as Toprol -XL 12.5 mg every 12 hours  -HR stable 5/18  14.  Hyperlipidemia.  continue Crestor /Zetia   15.  Elevated LFTs: d/c tylenol   -Recheck tomorrow   LOS: 5 days A FACE TO FACE EVALUATION WAS PERFORMED  Lylia Sand 08/01/2023, 12:05 PM

## 2023-08-02 DIAGNOSIS — L03116 Cellulitis of left lower limb: Secondary | ICD-10-CM

## 2023-08-02 LAB — HEPATIC FUNCTION PANEL
ALT: 45 U/L — ABNORMAL HIGH (ref 0–44)
AST: 65 U/L — ABNORMAL HIGH (ref 15–41)
Albumin: 2.2 g/dL — ABNORMAL LOW (ref 3.5–5.0)
Alkaline Phosphatase: 122 U/L (ref 38–126)
Bilirubin, Direct: 0.5 mg/dL — ABNORMAL HIGH (ref 0.0–0.2)
Indirect Bilirubin: 1.3 mg/dL — ABNORMAL HIGH (ref 0.3–0.9)
Total Bilirubin: 1.8 mg/dL — ABNORMAL HIGH (ref 0.0–1.2)
Total Protein: 5.6 g/dL — ABNORMAL LOW (ref 6.5–8.1)

## 2023-08-02 LAB — BASIC METABOLIC PANEL WITH GFR
Anion gap: 11 (ref 5–15)
BUN: 12 mg/dL (ref 8–23)
CO2: 19 mmol/L — ABNORMAL LOW (ref 22–32)
Calcium: 8.5 mg/dL — ABNORMAL LOW (ref 8.9–10.3)
Chloride: 102 mmol/L (ref 98–111)
Creatinine, Ser: 0.86 mg/dL (ref 0.61–1.24)
GFR, Estimated: >60 mL/min (ref >60)
Glucose, Bld: 88 mg/dL (ref 70–99)
Potassium: 5.3 mmol/L — ABNORMAL HIGH (ref 3.5–5.1)
Sodium: 132 mmol/L — ABNORMAL LOW (ref 135–145)

## 2023-08-02 LAB — CBC
HCT: 40.1 % (ref 39.0–52.0)
Hemoglobin: 12.5 g/dL — ABNORMAL LOW (ref 13.0–17.0)
MCH: 27.9 pg (ref 26.0–34.0)
MCHC: 31.2 g/dL (ref 30.0–36.0)
MCV: 89.5 fL (ref 80.0–100.0)
Platelets: 497 10*3/uL — ABNORMAL HIGH (ref 150–400)
RBC: 4.48 MIL/uL (ref 4.22–5.81)
RDW: 17.4 % — ABNORMAL HIGH (ref 11.5–15.5)
WBC: 9.6 10*3/uL (ref 4.0–10.5)
nRBC: 0 % (ref 0.0–0.2)

## 2023-08-02 MED ORDER — CALCIUM CARBONATE 1250 (500 CA) MG PO TABS
500.0000 mg | ORAL_TABLET | Freq: Every day | ORAL | Status: DC
  Administered 2023-08-03: 1250 mg via ORAL
  Filled 2023-08-02: qty 1

## 2023-08-02 NOTE — Progress Notes (Signed)
 Occupational Therapy Session Note  Patient Details  Name: Logan French MRN: [id number] Date of Birth: Nov 27, 1934  Today's Date: 08/02/2023 OT Individual Time: 1030-1059 OT Individual Time Calculation (min): 29 min    Short Term Goals: Week 1:  OT Short Term Goal 1 (Week 1): Pt will complete sit > stand in prep for ADL with CGA using LRAD OT Short Term Goal 2 (Week 1): Pt will engage in ADL task at sink in stance for 3 mins with min A to promote standing tolerance OT Short Term Goal 3 (Week 1): Pt will utilize AE PRN to thread LB clothing with supervision   Skilled Therapeutic Interventions/Progress Updates:    Pt up in wheelchair at time of session, no pain at rest but states he "over did it" yesterday stretching LLE. Pt verbose and needing redirecting to tasks. Focused on BUE strengthening per pt request with 3# dowel for chest press, overhead press, circles, push/pulls all for reps of 15. Back in room, pt ambulating to bathroom CGA with RW, toilet transfer same manner and able to doff LB clothing. Note 2/2 time constraints NT taking over for OT.   Therapy Documentation Precautions:  Precautions Precautions: Fall, Other (comment) Precaution/Restrictions Comments: bilat foot wounds Restrictions Weight Bearing Restrictions Per Provider Order: No    Therapy/Group: Individual Therapy  Doroteo Gasmen 08/02/2023, 7:24 AM

## 2023-08-02 NOTE — Progress Notes (Signed)
 PROGRESS NOTE   Subjective/Complaints: No events overnight.  No acute complaints.  Feels left lower extremity pain is improving.  Still limited in range of motion, positioning. AM labs pending, blood sugar stable.  Vital stable overnight.  ROS: Patient denies fever, chills, shortness of breath, chest pain, abdominal pain.  +RLE swelling, improved +Left lower extremity swelling and big toe ulceration    Objective:   No results found. No results for input(s): "WBC", "HGB", "HCT", "PLT" in the last 72 hours.  No results for input(s): "NA", "K", "CL", "CO2", "GLUCOSE", "BUN", "CREATININE", "CALCIUM " in the last 72 hours.   Intake/Output Summary (Last 24 hours) at 08/02/2023 0743 Last data filed at 08/02/2023 0340 Gross per 24 hour  Intake --  Output 1225 ml  Net -1225 ml        Physical Exam: Vital Signs Blood pressure 133/85, pulse (!) 57, temperature 98 F (36.7 C), temperature source Oral, resp. rate 18, height 6' (1.829 m), weight 93.7 kg, SpO2 97%.   General: Sitting upright in bed, no acute distress. HEENT: Head is normocephalic, atraumatic, PERRLA, EOMI, sclera anicteric, oral mucosa pink and moist Neck: Supple without JVD or lymphadenopathy Heart: Reg rate and rhythm. Chest: CTA bilaterally without wheezes, rales, or rhonchi; no distress Abdomen: Soft, non-tender, non-distended, bowel sounds positive. Psych: Pt's affect is appropriate. Pt is cooperative Skin: Right Achilles gauze dressing with wound healing, no apparent erythema, borders starting to close. Left anterior shin and foot covered in dry gauze, remain open with some mild erythema and induration but improving prior image         MSK:    Left lower extremity mildly tender to palpation on anterior shin and ankle.  Otherwise, full active range of motion in all 4 extremities.  Neuro:     Comments: Alert and oriented x 3. Normal insight and  awareness. Intact Memory. Normal language and speech. Cranial nerve exam unremarkable. MMT: BUE grossly 4/5 prox to distal. BLE 3+HF and KE and 4- ADF/PF. Mild sensory loss distally in both legs but otherwise sensory exam appears intact. DTR's 1+. No abnl resting tone.    Stable 5/19   Assessment/Plan: 1. Functional deficits which require 3+ hours per day of interdisciplinary therapy in a comprehensive inpatient rehab setting. Physiatrist is providing close team supervision and 24 hour management of active medical problems listed below. Physiatrist and rehab team continue to assess barriers to discharge/monitor patient progress toward functional and medical goals  Care Tool:  Bathing    Body parts bathed by patient: Right arm, Left arm, Chest, Abdomen, Front perineal area, Right upper leg, Left upper leg, Face   Body parts bathed by helper: Buttocks, Right lower leg Body parts n/a: Left lower leg   Bathing assist Assist Level: Minimal Assistance - Patient > 75%     Upper Body Dressing/Undressing Upper body dressing   What is the patient wearing?: Pull over shirt    Upper body assist Assist Level: Set up assist    Lower Body Dressing/Undressing Lower body dressing      What is the patient wearing?: Incontinence brief, Pants     Lower body assist Assist for lower body dressing: Minimal  Assistance - Patient > 75%     Financial trader Activity did not occur Press photographer and hygiene only): N/A (no void or bm)  Toileting assist Assist for toileting: Maximal Assistance - Patient 25 - 49%     Transfers Chair/bed transfer  Transfers assist  Chair/bed transfer activity did not occur: Safety/medical concerns  Chair/bed transfer assist level: Contact Guard/Touching assist Chair/bed transfer assistive device: Geologist, engineering   Ambulation assist      Assist level: Contact Guard/Touching assist Assistive device: Walker-rolling Max  distance: 150'   Walk 10 feet activity   Assist     Assist level: Contact Guard/Touching assist Assistive device: Walker-rolling   Walk 50 feet activity   Assist    Assist level: Contact Guard/Touching assist Assistive device: Walker-rolling    Walk 150 feet activity   Assist Walk 150 feet activity did not occur: Safety/medical concerns (fatigue, discomofort in LLE)  Assist level: Contact Guard/Touching assist Assistive device: Walker-rolling    Walk 10 feet on uneven surface  activity   Assist     Assist level: Minimal Assistance - Patient > 75% Assistive device: Development worker, international aid     Assist Is the patient using a wheelchair?: Yes Type of Wheelchair: Manual    Wheelchair assist level: Supervision/Verbal cueing Max wheelchair distance: 165    Wheelchair 50 feet with 2 turns activity    Assist        Assist Level: Supervision/Verbal cueing   Wheelchair 150 feet activity     Assist      Assist Level: Supervision/Verbal cueing   Blood pressure 133/85, pulse (!) 57, temperature 98 F (36.7 C), temperature source Oral, resp. rate 18, height 6' (1.829 m), weight 93.7 kg, SpO2 97%.  Medical Problem List and Plan: 1. Functional deficits secondary to sepsis secondary to cellulitis.  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  Plan follow-up Dopplers 4 to 6 weeks.             -patient may  shower if left leg is covered             -ELOS/Goals: 7-10 days, supervision to mod I with PT and sup/min with OT  -Continue CIR  2.  Antithrombotics: -DVT/anticoagulation:  Mechanical: Antiembolism stockings, thigh (TED hose) Bilateral lower extremities             -antiplatelet therapy: Plavix  75 mg daily 3. Pain Management: Oxycodone  as needed             -pain seems fairly controlled 4. Mood/Behavior/Sleep: Provide emotional support             -antipsychotic agents: N/A 5.  Neuropsych/cognition: This patient is capable of making decisions on his own behalf. 6. Wounds on both legs appear to be healing nicely.              -continue dry dressings with kerlix to left calf and foot as well as right achilles area, asked nursing to change dressing today  -5/14 seen by [facility]- for leg wounds  -5/18 images updated for the chart  7. Fluids/Electrolytes/Nutrition: Routine in and outs with follow-up chemistries  - 5-19: Labs back with reduced NA 132 and increased K 5.3.  Fairly elevated and asymptomatic, will repeat in a.m. and consider Lokelma.  Did encourage p.o. fluids with patient today.  - Calcium  staying ~8.3, will add 500 mg elemental daily for supplementation.  8.  ID/cellulitis..  Completing course  of Augmentin /doxycycline  9.  AKI.  Resolved.  Follow-up chemistries  - 5/14 BUN/creatinine stable  5-19: BUN/creatinine stable  10.  Diabetes mellitus.  Hemoglobin A1c 6.4. d/c ISS  - Blood sugars well-controlled on BMPs   11.  Hypertension.  Norvasc  2.5 mg daily.  Monitor with increased mobility  -5/18 BP overall stable although had few soft BP, continue to monitor trend.    5-19: Overall normotensive.  Continue current regimen                 08/02/2023    3:45 AM 08/01/2023    7:42 PM 08/01/2023    1:44 PM  Vitals with BMI  Systolic 133 128 272  Diastolic 85 65 75  Pulse 57 64 61    12.  History of prostate cancer.  Continue Proscar  5 mg daily  13.  Atrial fibrillation.  Cardiac rate controlled.  continue Librexia trial drug per cardiology services as well as Toprol -XL 12.5 mg every 12 hours  -HR stable 5/18  14.  Hyperlipidemia.  continue Crestor /Zetia   15.  Elevated LFTs: d/c tylenol   - Downtrending 5-19  LOS: 6 days A FACE TO FACE EVALUATION WAS PERFORMED  Bea Lime 08/02/2023, 7:43 AM

## 2023-08-02 NOTE — Progress Notes (Signed)
 Physical Therapy Session Note  Patient Details  Name: Safal Halderman MRN: [id number] Date of Birth: 1935/02/08  Today's Date: 08/02/2023 PT Individual Time: 9147-8295 PT Individual Time Calculation (min): 44 min   Short Term Goals: Week 1:  PT Short Term Goal 1 (Week 1): STG=LTG due to LOS  Skilled Therapeutic Interventions/Progress Updates: Pt presents sitting in w/c and agreeable to therapy, stating pain level of 1/10 to medical shin/calf area.  Pt transfers sit to stand w/ CGA and time.  Pt amb > 150' w/ RW and CGA/close supervision, cues for cadence, posture and step length RLE, improves w/ reminders.  Pt negotiated 4 steps using L rail ascending w/ B hands to simulate home transition from TV room to kitchen level of 2 steps.  Pt requires seated rest breaks 2/2 "labored" breathing.  Pt amb to return to room w/ CGA/Close sup and remained sitting in w/c , donning pullover sweatshirt w/ min A for completion.     Therapy Documentation Precautions:  Precautions Precautions: Fall, Other (comment) Precaution/Restrictions Comments: bilat foot wounds Restrictions Weight Bearing Restrictions Per Provider Order: No General:   Vital Signs:   Pain:2/10 Pain Assessment Pain Scale: 0-10 Pain Score: 2  Pain Location: Hip Pain Intervention(s): Medication (See eMAR)     Therapy/Group: Individual Therapy  Cayleigh Paull P Tamanna Whitson 08/02/2023, 11:48 AM

## 2023-08-02 NOTE — Progress Notes (Signed)
 Patient ID: Logan French, male   DOB: 21-May-1934, 89 y.o.   MRN: [id number] Met with son-Graham regarding plan for education and discharge on Friday. He wants to coordinate with brother to have come in so will cancel education tomorrow and he will let this worker know day and time that works for them to come with mom. Discussed home health and private duty and the difference between. He wanted a private duty list and have emailed him one to pursue this. Have decided home health would work better. Will let team know when family ed once hear tomorrow and work on discharge needs.

## 2023-08-02 NOTE — Progress Notes (Signed)
 Occupational Therapy Session Note  Patient Details  Name: Logan French MRN: [id number] Date of Birth: July 13, 1934  Today's Date: 08/02/2023 OT Individual Time: 1027-2536 & 6440-3474 OT Individual Time Calculation (min): 40 min & 70 min   Short Term Goals: Week 1:  OT Short Term Goal 1 (Week 1): Pt will complete sit > stand in prep for ADL with CGA using LRAD OT Short Term Goal 2 (Week 1): Pt will engage in ADL task at sink in stance for 3 mins with min A to promote standing tolerance OT Short Term Goal 3 (Week 1): Pt will utilize AE PRN to thread LB clothing with supervision  Skilled Therapeutic Interventions/Progress Updates:  Session 1 Skilled OT intervention completed with focus on ambulatory endurance, dynamic standing balance, activity tolerance. Pt received seated in w/c, agreeable to session. 4/10 pain reported in L hip; nurse notified of pain med request. OT offered rest breaks, repositioning throughout for pain reduction.  Pt independently retrieved fresh shirt, and swapped it out for current one. Cues needed for backing w/c up through tight space.   Cues needed to lock w/c brakes prior to mobility. Stood with CGA using BUE on w/c to RW then ambulated about 100 ft with CGA using RW > gym with cues for upright gaze and RW proximity.   Pt participated in the following dynamic standing balance and endurance tasks to promote independence and safety during BADLs and functional mobility: -placing 1 horse shoe on top of long mirror per 1 sit <> stand (8 total) using RW with CGA fading to close supervision. Retrieved horseshoes from long mirror while in stance with CGA and placed on RW. 8 horse shoes total  Pt ambulated with CGA using RW back to room with improved upright gaze/less lean on RW however still needed 1 cue to correct with onset of fatigue. Discussed him setting a goal of self-correcting to progress to rollator if that is his desire.  Pt remained seated in w/c, with chair  alarm on/activated, and with all needs in reach at end of session.  Session 2 Skilled OT intervention completed with focus on activity tolerance, ambulatory/cardiovascular endurance, LLE ROM, dynamic standing balance. Pt received supine in bed, agreeable to session. L calf pain reported; pre-medicated. OT offered rest breaks, repositioning throughout for pain reduction.  Pt verbalized extreme fatigue from earlier sessions, but agreeable to trial therapy. Required increased time for transitions. Transitioned supine > sitting EOB with supervision with heavy reliance on bed features. Threaded shorts with min A for LLE, max A for BLE shoes. CGA sit > stand from elevated bed height using RW, then donned shorts over hips with CGA. Ambulated with CGA using RW about 100 ft > gym with only 1 cue for upright gaze.   Pt completed the following intervals on nustep utilizing trail runner, to promote global/cardiovascular endurance and as well as gentle ROM on LLE needed for independence with BADLs and functional mobility: -15 mins, level 4  Pt participated in the following dynamic standing balance and endurance tasks to promote independence and safety during BADLs and functional mobility: -held onto a squigz piece in each hand, then completed 1 sit > stand for every set (5 sets) of pieces, requiring CGA without AD, to place on long mirror. Emphasis on using BUE during task for lessening dependency on RW. Completed 1 sit > stand to retrieve squigz from long mirror (5 total)  CGA stand pivot with RW > w/c. Transported dependently in w/c > room. Pt  remained seated in w/c, with chair alarm on/activated, and with all needs in reach at end of session.    Therapy Documentation Precautions:  Precautions Precautions: Fall, Other (comment) Precaution/Restrictions Comments: bilat foot wounds Restrictions Weight Bearing Restrictions Per Provider Order: No    Therapy/Group: Individual Therapy  Ruthanna Covert, MS,  OTR/L  08/02/2023, 3:34 PM

## 2023-08-03 ENCOUNTER — Ambulatory Visit: Admitting: Infectious Diseases

## 2023-08-03 ENCOUNTER — Inpatient Hospital Stay (HOSPITAL_COMMUNITY)

## 2023-08-03 LAB — RESPIRATORY PANEL BY PCR

## 2023-08-03 LAB — BASIC METABOLIC PANEL WITH GFR
Anion gap: 8 (ref 5–15)
BUN: 10 mg/dL (ref 8–23)
CO2: 24 mmol/L (ref 22–32)
Calcium: 8.5 mg/dL — ABNORMAL LOW (ref 8.9–10.3)
Chloride: 102 mmol/L (ref 98–111)
Creatinine, Ser: 0.96 mg/dL (ref 0.61–1.24)
GFR, Estimated: >60 mL/min (ref >60)
Glucose, Bld: 100 mg/dL — ABNORMAL HIGH (ref 70–99)
Potassium: 4.2 mmol/L (ref 3.5–5.1)
Sodium: 134 mmol/L — ABNORMAL LOW (ref 135–145)

## 2023-08-03 LAB — PROCALCITONIN: Procalcitonin: <0.1 ng/mL

## 2023-08-03 MED ORDER — DICLOFENAC SODIUM 1 % EX GEL
2.0000 g | Freq: Four times a day (QID) | CUTANEOUS | Status: DC
  Administered 2023-08-03 – 2023-08-10 (×14): 2 g via TOPICAL
  Filled 2023-08-03: qty 100

## 2023-08-03 NOTE — Progress Notes (Signed)
 PROGRESS NOTE   Subjective/Complaints: Feels unwell today, tachypneic, therapy noticed increased confusion, he is able to answer my questions well but is fatigued, viral panel ordered, CXR ordered, procal ordered  ROS: Patient denies fever, chills, shortness of breath, chest pain, abdominal pain.  +RLE swelling, improved +Left lower extremity swelling and big toe ulceration +malaise    Objective:   No results found. Recent Labs    08/02/23 1131  WBC 9.6  HGB 12.5*  HCT 40.1  PLT 497*    Recent Labs    08/02/23 0701 08/03/23 0546  NA 132* 134*  K 5.3* 4.2  CL 102 102  CO2 19* 24  GLUCOSE 88 100*  BUN 12 10  CREATININE 0.86 0.96  CALCIUM  8.5* 8.5*     Intake/Output Summary (Last 24 hours) at 08/03/2023 1253 Last data filed at 08/03/2023 0651 Gross per 24 hour  Intake 236 ml  Output 650 ml  Net -414 ml        Physical Exam: Vital Signs Blood pressure 137/71, pulse 66, temperature 98.2 F (36.8 C), temperature source Oral, resp. rate (!) 22, height 6' (1.829 m), weight 93.7 kg, SpO2 100%.   General: Lying in bed, appears unwell HEENT: Head is normocephalic, atraumatic, PERRLA, EOMI, sclera anicteric, oral mucosa pink and moist Neck: Supple without JVD or lymphadenopathy Heart: Reg rate and rhythm. Chest: CTA bilaterally without wheezes, rales, or rhonchi; no distress Abdomen: Soft, non-tender, non-distended, bowel sounds positive. Psych: Pt's affect is appropriate. Pt is cooperative Skin: Right Achilles gauze dressing with wound healing, no apparent erythema, borders starting to close. Left anterior shin and foot covered in dry gauze, remain open with some mild erythema and induration but improving prior image         MSK:    Left lower extremity mildly tender to palpation on anterior shin and ankle.  Otherwise, full active range of motion in all 4 extremities.  Neuro:     Comments: Alert  and oriented x 3. Normal insight and awareness. Intact Memory. Normal language and speech. Cranial nerve exam unremarkable. MMT: BUE grossly 4/5 prox to distal. BLE 3+HF and KE and 4- ADF/PF. Mild sensory loss distally in both legs but otherwise sensory exam appears intact. DTR's 1+. No abnl resting tone.    Stable 5/19   Assessment/Plan: 1. Functional deficits which require 3+ hours per day of interdisciplinary therapy in a comprehensive inpatient rehab setting. Physiatrist is providing close team supervision and 24 hour management of active medical problems listed below. Physiatrist and rehab team continue to assess barriers to discharge/monitor patient progress toward functional and medical goals  Care Tool:  Bathing    Body parts bathed by patient: Right arm, Left arm, Chest, Abdomen, Front perineal area, Right upper leg, Left upper leg, Face   Body parts bathed by helper: Buttocks, Right lower leg Body parts n/a: Left lower leg   Bathing assist Assist Level: Minimal Assistance - Patient > 75%     Upper Body Dressing/Undressing Upper body dressing   What is the patient wearing?: Pull over shirt    Upper body assist Assist Level: Set up assist    Lower Body Dressing/Undressing Lower body  dressing      What is the patient wearing?: Incontinence brief, Pants     Lower body assist Assist for lower body dressing: Minimal Assistance - Patient > 75%     Toileting Toileting Toileting Activity did not occur (Clothing management and hygiene only): N/A (no void or bm)  Toileting assist Assist for toileting: Maximal Assistance - Patient 25 - 49%     Transfers Chair/bed transfer  Transfers assist  Chair/bed transfer activity did not occur: Safety/medical concerns  Chair/bed transfer assist level: Contact Guard/Touching assist Chair/bed transfer assistive device: Geologist, engineering   Ambulation assist      Assist level: Contact Guard/Touching  assist Assistive device: Walker-rolling Max distance: 150+   Walk 10 feet activity   Assist     Assist level: Contact Guard/Touching assist Assistive device: Walker-rolling   Walk 50 feet activity   Assist    Assist level: Contact Guard/Touching assist Assistive device: Walker-rolling    Walk 150 feet activity   Assist Walk 150 feet activity did not occur: Safety/medical concerns (fatigue, discomofort in LLE)  Assist level: Contact Guard/Touching assist Assistive device: Walker-rolling    Walk 10 feet on uneven surface  activity   Assist     Assist level: Minimal Assistance - Patient > 75% Assistive device: Development worker, international aid     Assist Is the patient using a wheelchair?: Yes Type of Wheelchair: Manual    Wheelchair assist level: Supervision/Verbal cueing Max wheelchair distance: 165    Wheelchair 50 feet with 2 turns activity    Assist        Assist Level: Supervision/Verbal cueing   Wheelchair 150 feet activity     Assist      Assist Level: Supervision/Verbal cueing   Blood pressure 137/71, pulse 66, temperature 98.2 F (36.8 C), temperature source Oral, resp. rate (!) 22, height 6' (1.829 m), weight 93.7 kg, SpO2 100%.  Medical Problem List and Plan: 1. Functional deficits secondary to sepsis secondary to cellulitis.  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  Plan follow-up Dopplers 4 to 6 weeks.             -patient may  shower if left leg is covered             -ELOS/Goals: 7-10 days, supervision to mod I with PT and sup/min with OT  -Continue CIR  2.  Antithrombotics: -DVT/anticoagulation:  Mechanical: Antiembolism stockings, thigh (TED hose) Bilateral lower extremities             -antiplatelet therapy: Plavix  75 mg daily 3. Pain Management: Oxycodone  as needed             -pain seems fairly controlled 4. Mood/Behavior/Sleep: Provide emotional  support             -antipsychotic agents: N/A 5. Neuropsych/cognition: This patient is capable of making decisions on his own behalf. 6. Wounds on both legs appear to be healing nicely.              -continue dry dressings with kerlix to left calf and foot as well as right achilles area, asked nursing to change dressing today  -5/14 seen by [facility]- for leg wounds  -5/18 images updated for the chart  7. Fluids/Electrolytes/Nutrition: Routine in and outs with follow-up chemistries  - 5-19: Labs back with reduced NA 132 and increased K 5.3.  Fairly elevated and asymptomatic, will repeat in a.m. and consider Lokelma.  Did encourage p.o. fluids with patient today.  8.  ID/cellulitis..  Completing course of Augmentin /doxycycline  9.  AKI.  Resolved.  Follow-up chemistries  - 5/14 BUN/creatinine stable  5-19: BUN/creatinine stable  10.  Diabetes mellitus.  Hemoglobin A1c 6.4. d/c ISS  - Blood sugars well-controlled on BMPs   11.  Hypertension.  Norvasc  2.5 mg daily.  Monitor with increased mobility  -5/18 BP overall stable although had few soft BP, continue to monitor trend.    5-19: Overall normotensive.  Continue current regimen                 08/03/2023    9:29 AM 08/03/2023    5:28 AM 08/02/2023    8:00 PM  Vitals with BMI  Systolic 137 126 161  Diastolic 71 67 66  Pulse 66 63 67    12.  History of prostate cancer.  Continue Proscar  5 mg daily  13.  Atrial fibrillation.  Cardiac rate controlled.  continue Librexia trial drug per cardiology services as well as Toprol -XL 12.5 mg every 12 hours  14.  Hyperlipidemia: continue Crestor /Zetia   15.  Elevated LFTs: d/c tylenol   16. Tachypnea: viral respiratory panel, procal, CXR ordered  LOS: 7 days A FACE TO FACE EVALUATION WAS PERFORMED  Keven Pel Nickcole Bralley 08/03/2023, 12:53 PM

## 2023-08-03 NOTE — Progress Notes (Signed)
 Patient ID: Logan French, male   DOB: [birth year], 89 y.o.   MRN: [id number]  Spoke with Mike Alcon to confirm family education tomorrow at 2:30-4:00 brother coming form [place] to see along with pt's wife. Aware this worker will not be available due to outside appointment

## 2023-08-03 NOTE — Progress Notes (Signed)
 Physical Therapy Session Note  Patient Details  Name: Logan French MRN: [id number] Date of Birth: October 19, 1934  Today's Date: 08/03/2023 PT Individual Time: 1302-1357 PT Individual Time Calculation (min): 55 min   Short Term Goals: Week 1:  PT Short Term Goal 1 (Week 1): STG=LTG due to LOS  Skilled Therapeutic Interventions/Progress Updates:   Received pt supine in bed with RN attending to care - pt currently on Droplet precautions. Pt reported pain along L groin and having chills this morning but is feeling a little better this afternoon. PA, Dan, present reporting plan for chest x-ray sometime today. Per MD, pt on therapy hold but pt eager to attempt some exercises - cleared by MD.   Pt requested to eat lunch but declined getting OOB. Assisted pt with positioning in bed for comfort and set up to eat lunch. Removed sweatshirt with total A and while eating, pt reported feeling better and agreeable to bed level exercises. Discussed plan for family education tomorrow with wife and son and pt preferring home health nursing care with OPPT. Pt performed the following exercises with emphasis on LE strength/ROM: -SLR 2x12 on RLE and 2x10 AAROM on LLE -hip abduction 2x12 on RLE and 2x10 AAROM on LLE -hip adduction isometrics 2x10 with 5 second isometric hold -ankle circles x20 clockwise and counterclockwise (decreased ROM on L ankle) Pt verbose and required increased time with exercises throughout session. Concluded session with pt semi-reclined in bed, needs within reach, and bed alarm on.   Therapy Documentation Precautions:  Precautions Precautions: Fall, Other (comment) Precaution/Restrictions Comments: bilat foot wounds Restrictions Weight Bearing Restrictions Per Provider Order: No  Therapy/Group: Individual Therapy Nicolas Barren Zaunegger Nena Bank PT, DPT 08/03/2023, 6:52 AM

## 2023-08-03 NOTE — Progress Notes (Signed)
 Occupational Therapy Session Note  Patient Details  Name: Logan French MRN: [id number] Date of Birth: 1934-11-29  Today's Date: 08/03/2023 OT Individual Time: 8413-2440 & 1430-1510 OT Individual Time Calculation (min): 72 min & 40 min OT missed time: 20 min Missed time reason: fatigue    Short Term Goals: Week 1:  OT Short Term Goal 1 (Week 1): Pt will complete sit > stand in prep for ADL with CGA using LRAD OT Short Term Goal 2 (Week 1): Pt will engage in ADL task at sink in stance for 3 mins with min A to promote standing tolerance OT Short Term Goal 3 (Week 1): Pt will utilize AE PRN to thread LB clothing with supervision  Skilled Therapeutic Interventions/Progress Updates:  Session 1 Skilled OT intervention completed with focus on activity tolerance, pain management, BUE exercises. Pt received seated in w/c with nurse present administering meds, agreeable to session. 5/10 pain reported in L hip/groin. OT offered rest breaks, repositioning and topical heat for pain reduction.  Pt declined self-care needs. Reported increased pain described as a catching feeling in groin region. Pt desired to trial walking on LLE as in the past it has loosened the discomfort. Donned both shoes dependently. Pt required significantly increased time due to discomfort and difficulty moving, for scooting forward in w/c, mod A sit > stand using BUE pushing up from w/c. CGA/min A for ambulating about 100 ft using RW with pt taking a standing break every 5-10 ft due to discomfort.   Seated EOM, had pt transition sit > supine with max A needed for BLE and trunk where as pt is typically supervision. Placed a wedge behind back and bolster under knees for comfort. Applied hot pack to L groin/hip region for 20 mins (removed following and skin intact), and covered with blankets due to full body shaking and pt indicating he was cold. Vitals assessed due to history of infection/sepsis- BP 132/71, HR 80 bpm, temp 98  degrees. Nurse at pt's side for assessment. Secure chat sent to medical team regarding pt's status.  In supine, pt completed the following exercises for activity tolerance and BUE endurance needed for BADLs: -SAQ bilaterally 2x30 -Single arm chest press 3x15 each side, 5 lb dumbbell -Single arm bicep curl 2x15 each side, 5 lb dumbbell  Required max A to transition supine > sit. Min A sit > stand from elevated mat using RW, then CGA stand pivot > w/c with RW. Transported dependently in w/c > room. Min A sit > stand with cues for anterior weight shift, CGA stand pivot with RW > EOB. Max A sit > supine transition for BLE.  Pt remained semi upright in bed, with bed alarm on/activated, and with all needs in reach at end of session.  Session 2 Skilled OT intervention completed with focus on activity tolerance, BUE strengthening, toileting needs. Pt received supine in bed, agreeable to session. L hip/groin pain reported; pre-medicated. OT offered rest breaks, repositioning throughout for pain reduction.  Per MD, pt cleared to participate in therapy as tolerated with pt agreeable to EOB exercises. Required min/mod A for supine > sit with extensive time for BLE management.   Seated EOM, pt completed the following BUE exercises to promote BUE strength/endurance needed for independence with functional transfers and BADLs: (With red theraband) 15 reps Horizontal abduction Self-anchored shoulder flexion each arm Self-anchored bicep flexion each arm Self-anchored tricep extension each arm Alternating chest presses  Pt with difficulty clearing/scooting hips > HOB therefore agreeable to  stand. From elevated bed height, pt was able to stand with min A using RW, then side step > HOB with CGA. While in stance, pt requested to use urinal. Close supervision for continent void; of note- urine a dark amber color. Encouraged pt to stay hydrated. Required max A for sit > supine transition. Pt remained upright in bed,  with bed alarm on/activated, and with all needs in reach at end of session. Pt missed 20 mins of OT intervention secondary to fatigue; OT will make up missed time as able.    Therapy Documentation Precautions:  Precautions Precautions: Fall, Other (comment) Precaution/Restrictions Comments: bilat foot wounds Restrictions Weight Bearing Restrictions Per Provider Order: No    Therapy/Group: Individual Therapy  Ruthanna Covert, MS, OTR/L  08/03/2023, 3:24 PM

## 2023-08-04 LAB — CBC WITH DIFFERENTIAL/PLATELET
Abs Immature Granulocytes: 0.06 10*3/uL (ref 0.00–0.07)
Basophils Absolute: 0.1 10*3/uL (ref 0.0–0.1)
Basophils Relative: 1 %
Eosinophils Absolute: 0.1 10*3/uL (ref 0.0–0.5)
Eosinophils Relative: 1 %
HCT: 33.8 % — ABNORMAL LOW (ref 39.0–52.0)
Hemoglobin: 10.9 g/dL — ABNORMAL LOW (ref 13.0–17.0)
Immature Granulocytes: 1 %
Lymphocytes Relative: 18 %
Lymphs Abs: 1.6 10*3/uL (ref 0.7–4.0)
MCH: 28.1 pg (ref 26.0–34.0)
MCHC: 32.2 g/dL (ref 30.0–36.0)
MCV: 87.1 fL (ref 80.0–100.0)
Monocytes Absolute: 1.1 10*3/uL — ABNORMAL HIGH (ref 0.1–1.0)
Monocytes Relative: 12 %
Neutro Abs: 5.9 10*3/uL (ref 1.7–7.7)
Neutrophils Relative %: 67 %
Platelets: 356 10*3/uL (ref 150–400)
RBC: 3.88 MIL/uL — ABNORMAL LOW (ref 4.22–5.81)
RDW: 17.5 % — ABNORMAL HIGH (ref 11.5–15.5)
WBC: 8.8 10*3/uL (ref 4.0–10.5)
nRBC: 0 % (ref 0.0–0.2)

## 2023-08-04 LAB — COMPREHENSIVE METABOLIC PANEL WITH GFR
ALT: 37 U/L (ref 0–44)
AST: 34 U/L (ref 15–41)
Albumin: 2 g/dL — ABNORMAL LOW (ref 3.5–5.0)
Alkaline Phosphatase: 96 U/L (ref 38–126)
Anion gap: 7 (ref 5–15)
BUN: 10 mg/dL (ref 8–23)
CO2: 23 mmol/L (ref 22–32)
Calcium: 8.1 mg/dL — ABNORMAL LOW (ref 8.9–10.3)
Chloride: 103 mmol/L (ref 98–111)
Creatinine, Ser: 0.84 mg/dL (ref 0.61–1.24)
GFR, Estimated: >60 mL/min (ref >60)
Glucose, Bld: 120 mg/dL — ABNORMAL HIGH (ref 70–99)
Potassium: 3.8 mmol/L (ref 3.5–5.1)
Sodium: 133 mmol/L — ABNORMAL LOW (ref 135–145)
Total Bilirubin: 0.8 mg/dL (ref 0.0–1.2)
Total Protein: 5.6 g/dL — ABNORMAL LOW (ref 6.5–8.1)

## 2023-08-04 MED ORDER — COLLAGENASE 250 UNIT/GM EX OINT
TOPICAL_OINTMENT | Freq: Every day | CUTANEOUS | Status: DC
  Filled 2023-08-04: qty 30

## 2023-08-04 NOTE — Progress Notes (Signed)
 Physical Therapy Session Note  Patient Details  Name: Logan French MRN: [id number] Date of Birth: [birth year]  Today's Date: 08/04/2023 PT Individual Time: 4098-1191 an 4782-9562 PT Individual Time Calculation (min): 55 min and 30 min  Short Term Goals: Week 1:  PT Short Term Goal 1 (Week 1): STG=LTG due to LOS  Skilled Therapeutic Interventions/Progress Updates:   Treatment Session 1 Received pt sitting in WC, pt agreeable to PT treatment, and denied any pain during session, just tightness and discomfort in LLE. Session with emphasis on functional mobility/transfers, generalized strengthening and endurance, dynamic standing balance/coordination, ambulation, and stair navigation. Pt stood with RW and light min A and ambulated 169ft with RW and close supervision to main therapy gym. Took seated rest break, then stood at staircase and navigated 8 6in steps with bilateral handrails and CGA/close supervision ascending and descending with a step to pattern with increased time. Pt grimacing and reporting tightness in LLE but denied any true pain - noted increased redness in L calf. During seated rest break elevated BLE on stool with pillow for comfort and edema management and noticed weeping from L leg upon removing pillow. Treatment team notified and PA present briefly during session - plan for WOC consult.   Pt then navigated additional 3 6in steps using R handrail with CGA using a lateral stepping technique. Pt with difficulty moving LLE, requiring UE assist to move LLE. Stood from [facility] with RW and CGA and ambulated 119ft with RW and supervision back to room. Removed shoes with max A and transferred into supine with mod A for BLE management. Concluded session with pt semi-reclined in bed, needs within reach, and bed alarm on.   Treatment Session 2 Received pt semi-reclined in bed with son, wife, and visitor present for family education. Pt agreeable to PT treatment and did not state pain level  during session but reported pain with knee flexion during car transfer. Session with emphasis on discharge planning, functional mobility/transfers, generalized strengthening and endurance, ambulation, and simulated car transfers.  Discussed with pt/family the following: -extended D/C date to Tuesday -home health nursing and follow up PT (pt declining OT) -increased weeping in LLE and WOC consult  Pt transferred semi-reclined<>sitting L EOB with HOB elevated and mod HHA (reaching out for assist from son). Stood with RW and CGA and ambulated 152ft with RW and close supervision to ortho gym. Pt performed simulated car transfer with mod A for BLE management (pain with L knee flexion), then ambulated 58ft on uneven surfaces (ramp) with RW and CGA. Pt left seated in WC in ortho gym for OT family education.   Therapy Documentation Precautions:  Precautions Precautions: Fall, Other (comment) Precaution/Restrictions Comments: bilat foot wounds Restrictions Weight Bearing Restrictions Per Provider Order: No  Therapy/Group: Individual Therapy Nicolas Barren Zaunegger Nena Bank PT, DPT 08/04/2023, 6:53 AM

## 2023-08-04 NOTE — Consult Note (Signed)
 WOC Nurse Consult Note: Asked to consult again on lower extremity wounds due to noted increased drainage.  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 Plan follow-up Dopplers 4 to 6 weeks. Previous orders to cover left leg when showering.  I confirmed with Dr Alessandra Ancona that patient can shower with all wounds uncovered and to redress after showering.   Reason for Consult:Reassess nonhealing wounds.   Wound type: mixed venous and arterial.  Recent vascular consult and intervention with improved ABI and TBI.  Compression recommendations to be made at 4 to 6 week follow up in the outpatient setting.  Pressure Injury POA: NA Wound bed: LEft dorsal foot: Ruptured bulla, wound bed is 75% red, 25% pale pink nongranulating, area is improving Left great toe scabbed and dry Left posterior lower leg:  70% devitalized tissue.30% pale pink nongranulating.  Little to no improvement with medihoney.  Right achilles' area :  20% scabbed 80% ruddy red  Chronic skin changes to bilateral lower legs Drainage (amount, consistency, odor) noted increased drainage.  Is minimally exudative on my assessment.  Generalized edema to bilateral feet and extending some to lower leg.  Patient will elevate legs as often as he can, compression to be determined at vascular follow up.   Periwound:dry scaly skin  Dressing procedure/placement/frequency:Patient may shower.  Remove all dressings, allow patient to shower and wash legs with soap and water and pat dry.  Rn to redress wounds after shower.   Cleanse all wounds on bilateral lower legs and feet with VASHE cleanser.  Allow to air dry. Apply Eucerin to lower legs to moisturize intact skin Apply Santyl to open wound on left posterior lower leg to encourage debridement. Cover with NS moist gauze and 4x4 dry gauze/kerlix.   All remaining wounds are  cleansed with VASHE and continue Xeroform gauze to wound bed.  May cover with foam dressing or gauze and kerlix.   Will stop medihoney.  Change daily.  Wife will need to be shown how to perform wound care. Patient anticipating discharge near end of the week.  Will not follow at this time.  Please re-consult if needed.  Branda Cain MSN, RN, FNP-BC CWON Wound, Ostomy, Continence Nurse Outpatient [facility] [phone] Pager [phone]

## 2023-08-04 NOTE — Progress Notes (Signed)
 Occupational Therapy Weekly Progress Note  Patient Details  Name: Logan French MRN: [id number] Date of Birth: 02/15/1935  Beginning of progress report period: Jul 28, 2023 End of progress report period: Aug 04, 2023  Patient has met 3 of 3 short term goals. Pt is making gradual progress towards LTGs. He is able to bathe at an overall min A level, dress LB with min A, set up A for UB, and CGA/min A for toileting. Pt recently with new onset of L groin/hip pain, but with consistent L knee pain that has limited pt's functional mobility and independence especially with reaching BLE during ADLs. Pt continues to demonstrate dynamic standing balance, gross motor coordination and functional endurance deficits resulting in difficulty completing BADL tasks without increased physical assist. Pt will benefit from continued skilled OT services to focus on mentioned deficits. Plan to extend pt's DC to 5/27 due to recent set back with pain and pt not at goal level. Family ed planned for 5/21.   Patient continues to demonstrate the following deficits: muscle weakness, decreased cardiorespiratoy endurance, decreased coordination, and decreased standing balance and therefore will continue to benefit from skilled OT intervention to enhance overall performance with BADL and Reduce care partner burden.  Patient progressing toward long term goals..  Continue plan of care.  OT Short Term Goals Week 1:  OT Short Term Goal 1 (Week 1): Pt will complete sit > stand in prep for ADL with CGA using LRAD OT Short Term Goal 1 - Progress (Week 1): Met OT Short Term Goal 2 (Week 1): Pt will engage in ADL task at sink in stance for 3 mins with min A to promote standing tolerance OT Short Term Goal 2 - Progress (Week 1): Met OT Short Term Goal 3 (Week 1): Pt will utilize AE PRN to thread LB clothing with supervision OT Short Term Goal 3 - Progress (Week 1): Not met Week 2:  OT Short Term Goal 1 (Week 2): STG = LTG due to  [facility], MS, OTR/L  08/04/2023, 12:27 PM

## 2023-08-04 NOTE — Progress Notes (Signed)
 Occupational Therapy Session Note  Patient Details  Name: Logan French MRN: [id number] Date of Birth: 05-22-1934  Today's Date: 08/04/2023 OT Individual Time: 1478-2956 & 2130-8657 OT Individual Time Calculation (min): 70 min & 38 min   Short Term Goals: Week 1:  OT Short Term Goal 1 (Week 1): Pt will complete sit > stand in prep for ADL with CGA using LRAD OT Short Term Goal 2 (Week 1): Pt will engage in ADL task at sink in stance for 3 mins with min A to promote standing tolerance OT Short Term Goal 3 (Week 1): Pt will utilize AE PRN to thread LB clothing with supervision  Skilled Therapeutic Interventions/Progress Updates:  Session 1 Skilled OT intervention completed with focus on activity tolerance, cardiovascular endurance, LLE ROM. Pt received seated in w/c brushing teeth at sink, agreeable to session. 3/10 pain reported in LLE vs hip described as "stiffness"; un medicated. OT offered rest breaks, repositioning and ROM throughout for pain reduction.  Pt declined further self care needs. Reported feeling better this morning and was motivated to get back moving. Pt with difficulty reaching to feet due to hamstring tightness for donning of shoes. Required max A. Completed CGA sit > stand with cues needed for "nose over toes" as pt used BUE on arm rests to power up. Ambulated about 500 ft using RW with CGA and intermittent cueing for upright gaze, however improved maintenance of RW to body. Extended rest break offered.   Pt completed the following intervals on nustep to promote LLE ROM, and cardiovascular endurance needed for independence with BADLs and functional mobility: -3 mins, level 3 -12 mins, level 4  Close supervision sit > stand using RW, then ambulated another 500 ft with CGA/supervision using RW. With fatigue, pt required 2 intermittent standing rest breaks and cues for upright gaze/proximity of RW however reported that LLE felt better after activity.  Seated in w/c,  education provided on self-hamstring stretches pt can complete at home in prep for mobility, increasing access to LB during dressing and to reduce BLE discomfort. With either LE extended, pt walked either hand down to the shin x10, with greater difficulty on LLE.  Pt remained seated in w/c, with chair alarm on/activated, and with all needs in reach at end of session.  Session 2 Skilled OT intervention completed with focus on family education with pt's wife, son and friend present. Pt received seated in w/c in gym, agreeable to session. No pain reported.  OT provided education on the following in prep for DC: -Discussed pt's recent change in functional mobility, with purpose behind extending pt's DC date to achieve goals/safety needed for DC -Recommended RW for all mobility and min ADLs due to difficulty accessing BLE -Confirmed bathroom set up, with family reporting walk in shower with STE and shower chair from previous admission. Advised that back stepping would still be safest, with CGA at minimum by sons or hired aide for fall prevention due to pt's balance deficits -Advised use of toilet raiser and push up rails that they already own for pt to power up into standing due to his reliance on using BUE to push up to stand. Discussed urinal use at EOB/during the night -Discussed difference between wound/therapy/aide care in [facility] and examples of what each would be responsible for -Suggested that pt have someone be with him during day/night as pt appears less sharp with his sequencing and awareness as compared to last admission, and in case pt is unable to meet  mod I level goals -Discussed wound healing and no definitive time that can be told to them for complete healing -Encouraged daily skin inspections  OT assisted pt in demonstrating the following during session: -CGA sit > stand with cues for anterior weight shift using RW -CGA ambulatory transfer > BSC over toilet with RW -Close supervision  doffing of pants; cues needed to avoid turning his body around with clothing wrapped at ankles for fall prevention  Direct care handoff to NT due to OT time constraint.    Therapy Documentation Precautions:  Precautions Precautions: Fall, Other (comment) Precaution/Restrictions Comments: bilat foot wounds Restrictions Weight Bearing Restrictions Per Provider Order: No    Therapy/Group: Individual Therapy  Ruthanna Covert, MS, OTR/L  08/04/2023, 3:46 PM

## 2023-08-04 NOTE — Plan of Care (Signed)
  Problem: Consults Goal: RH GENERAL PATIENT EDUCATION Description: See Patient Education module for education specifics. Outcome: Progressing   Problem: RH BOWEL ELIMINATION Goal: RH STG MANAGE BOWEL WITH ASSISTANCE Description: STG Manage Bowel with toileting Assistance. Outcome: Progressing Goal: RH STG MANAGE BOWEL W/MEDICATION W/ASSISTANCE Description: STG Manage Bowel with Medication with mod I Assistance. Outcome: Progressing   Problem: RH BLADDER ELIMINATION Goal: RH STG MANAGE BLADDER WITH ASSISTANCE Description: STG Manage Bladder With Assistance Outcome: Progressing Goal: RH STG MANAGE BLADDER WITH MEDICATION WITH ASSISTANCE Description: STG Manage Bladder With Medication With mod I Assistance. Outcome: Progressing   Problem: RH SKIN INTEGRITY Goal: RH STG SKIN FREE OF INFECTION/BREAKDOWN Description: Manage skin/wounds w min assist Outcome: Progressing Goal: RH STG MAINTAIN SKIN INTEGRITY WITH ASSISTANCE Description: STG Maintain Skin Integrity With min Assistance. Outcome: Progressing Goal: RH STG ABLE TO PERFORM INCISION/WOUND CARE W/ASSISTANCE Description: STG Able To Perform Incision/Wound Care With min Assistance. Outcome: Progressing   Problem: RH SAFETY Goal: RH STG ADHERE TO SAFETY PRECAUTIONS W/ASSISTANCE/DEVICE Description: STG Adhere to Safety Precautions With cues Assistance/Device. Outcome: Progressing   Problem: RH PAIN MANAGEMENT Goal: RH STG PAIN MANAGED AT OR BELOW PT'S PAIN GOAL Description: < 4 with prns Outcome: Progressing   Problem: RH KNOWLEDGE DEFICIT GENERAL Goal: RH STG INCREASE KNOWLEDGE OF SELF CARE AFTER HOSPITALIZATION Description: Patient and family will be able to manage care at discharge using educational resources independently Outcome: Progressing

## 2023-08-04 NOTE — Progress Notes (Signed)
 PROGRESS NOTE   Subjective/Complaints: Last Bm 5/19, offered prune juice Discussed plan for caregiver education today, discussed that he had a good walk this morning   ROS: Patient denies fever, chills, shortness of breath, chest pain, abdominal pain.  +RLE swelling, improved +Left lower extremity swelling and big toe ulceration +malaise +left groin pain- improved    Objective:   DG HIP UNILAT WITH PELVIS 1V LEFT Result Date: 08/03/2023 CLINICAL DATA:  Left hip pain EXAM: DG HIP (WITH OR WITHOUT PELVIS) 3V COMPARISON:  None Available. FINDINGS: Pelvic ring is intact. Degenerative changes of the hip joints are noted bilaterally. No acute fracture or dislocation is noted. Mild soft tissue prominence is noted laterally which may be related to subcutaneous edema possibly from recent fall. Correlate with clinical history. IMPRESSION: No acute fracture noted. Mild soft tissue swelling laterally. Electronically Signed   By: Violeta Grey M.D.   On: 08/03/2023 20:19   DG Chest 2 View Result Date: 08/03/2023 CLINICAL DATA:  Tachypnea EXAM: CHEST - 2 VIEW COMPARISON:  07/15/2023 FINDINGS: Cardiac shadow is enlarged but stable. Aortic calcifications are noted. The lungs are well aerated bilaterally. No focal infiltrate or effusion is noted. No acute bony abnormality is seen. IMPRESSION: No active cardiopulmonary disease. Electronically Signed   By: Violeta Grey M.D.   On: 08/03/2023 20:18   Recent Labs    08/02/23 1131 08/04/23 0505  WBC 9.6 8.8  HGB 12.5* 10.9*  HCT 40.1 33.8*  PLT 497* 356    Recent Labs    08/03/23 0546 08/04/23 0505  NA 134* 133*  K 4.2 3.8  CL 102 103  CO2 24 23  GLUCOSE 100* 120*  BUN 10 10  CREATININE 0.96 0.84  CALCIUM  8.5* 8.1*     Intake/Output Summary (Last 24 hours) at 08/04/2023 0934 Last data filed at 08/04/2023 0804 Gross per 24 hour  Intake 476 ml  Output 475 ml  Net 1 ml         Physical Exam: Vital Signs Blood pressure 133/85, pulse 73, temperature 98 F (36.7 C), resp. rate 17, height 6' (1.829 m), weight 93.7 kg, SpO2 98%.   General: Lying in bed, appears unwell HEENT: Head is normocephalic, atraumatic, PERRLA, EOMI, sclera anicteric, oral mucosa pink and moist Neck: Supple without JVD or lymphadenopathy Heart: Reg rate and rhythm. Chest: CTA bilaterally without wheezes, rales, or rhonchi; no distress Abdomen: Soft, non-tender, non-distended, bowel sounds positive. Psych: Pt's affect is appropriate. Pt is cooperative Skin: Right Achilles gauze dressing with wound healing, no apparent erythema, borders starting to close. Left anterior shin and foot covered in dry gauze, remain open with some mild erythema and induration but improving prior image         MSK:    Left lower extremity mildly tender to palpation on anterior shin and ankle.  Otherwise, full active range of motion in all 4 extremities.  Neuro:     Comments: Alert and oriented x 3. Normal insight and awareness. Intact Memory. Normal language and speech. Cranial nerve exam unremarkable. MMT: BUE grossly 4/5 prox to distal. BLE 3+HF and KE and 4- ADF/PF. Mild sensory loss distally in both legs but otherwise  sensory exam appears intact. DTR's 1+. No abnl resting tone.    Stable 5/21   Assessment/Plan: 1. Functional deficits which require 3+ hours per day of interdisciplinary therapy in a comprehensive inpatient rehab setting. Physiatrist is providing close team supervision and 24 hour management of active medical problems listed below. Physiatrist and rehab team continue to assess barriers to discharge/monitor patient progress toward functional and medical goals  Care Tool:  Bathing    Body parts bathed by patient: Right arm, Left arm, Chest, Abdomen, Front perineal area, Right upper leg, Left upper leg, Face   Body parts bathed by helper: Buttocks, Right lower leg Body parts n/a: Left  lower leg   Bathing assist Assist Level: Minimal Assistance - Patient > 75%     Upper Body Dressing/Undressing Upper body dressing   What is the patient wearing?: Pull over shirt    Upper body assist Assist Level: Set up assist    Lower Body Dressing/Undressing Lower body dressing      What is the patient wearing?: Incontinence brief, Pants     Lower body assist Assist for lower body dressing: Minimal Assistance - Patient > 75%     Toileting Toileting Toileting Activity did not occur (Clothing management and hygiene only): N/A (no void or bm)  Toileting assist Assist for toileting: Maximal Assistance - Patient 25 - 49%     Transfers Chair/bed transfer  Transfers assist  Chair/bed transfer activity did not occur: Safety/medical concerns  Chair/bed transfer assist level: Contact Guard/Touching assist Chair/bed transfer assistive device: Geologist, engineering   Ambulation assist      Assist level: Contact Guard/Touching assist Assistive device: Walker-rolling Max distance: 150+   Walk 10 feet activity   Assist     Assist level: Contact Guard/Touching assist Assistive device: Walker-rolling   Walk 50 feet activity   Assist    Assist level: Contact Guard/Touching assist Assistive device: Walker-rolling    Walk 150 feet activity   Assist Walk 150 feet activity did not occur: Safety/medical concerns (fatigue, discomofort in LLE)  Assist level: Contact Guard/Touching assist Assistive device: Walker-rolling    Walk 10 feet on uneven surface  activity   Assist     Assist level: Minimal Assistance - Patient > 75% Assistive device: Development worker, international aid     Assist Is the patient using a wheelchair?: Yes Type of Wheelchair: Manual    Wheelchair assist level: Supervision/Verbal cueing Max wheelchair distance: 165    Wheelchair 50 feet with 2 turns activity    Assist        Assist Level: Supervision/Verbal  cueing   Wheelchair 150 feet activity     Assist      Assist Level: Supervision/Verbal cueing   Blood pressure 133/85, pulse 73, temperature 98 F (36.7 C), resp. rate 17, height 6' (1.829 m), weight 93.7 kg, SpO2 98%.  Medical Problem List and Plan: 1. Functional deficits secondary to sepsis secondary to cellulitis.  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  Plan follow-up Dopplers 4 to 6 weeks.             -patient may  shower if left leg is covered             -ELOS/Goals: 7-10 days, supervision to mod I with PT and sup/min with OT  -Continue CIR  Discussed plan for team meeting today  2.  Antithrombotics: -DVT/anticoagulation:  Mechanical: Antiembolism stockings, thigh (TED hose) Bilateral lower extremities             -  antiplatelet therapy: Plavix  75 mg daily 3. Pain Management: Oxycodone  as needed             -pain seems fairly controlled 4. Mood/Behavior/Sleep: Provide emotional support             -antipsychotic agents: N/A 5. Neuropsych/cognition: This patient is capable of making decisions on his own behalf. 6. Wounds on both legs appear to be healing nicely.              -continue dry dressings with kerlix to left calf and foot as well as right achilles area, asked nursing to change dressing today  -5/14 seen by [facility]- for leg wounds  -5/18 images updated for the chart  7. Fluids/Electrolytes/Nutrition: Routine in and outs with follow-up chemistries  - 5-19: Labs back with reduced NA 132 and increased K 5.3.  Fairly elevated and asymptomatic, will repeat in a.m. and consider Lokelma.  Did encourage p.o. fluids with patient today.  8.  ID/cellulitis..  Completing course of Augmentin /doxycycline  9.  AKI.  Resolved.  Follow-up chemistries  - 5/14 BUN/creatinine stable  5-19: BUN/creatinine stable  10.  Diabetes mellitus.  Hemoglobin A1c 6.4. d/c ISS  - Blood sugars well-controlled on BMPs   11.   Hypertension.  Norvasc  2.5 mg daily.  Monitor with increased mobility  -5/18 BP overall stable although had few soft BP, continue to monitor trend.    5-19: Overall normotensive.  Continue current regimen                 08/04/2023    5:11 AM 08/03/2023    7:31 PM 08/03/2023    3:43 PM  Vitals with BMI  Systolic 133 123 161  Diastolic 85 61 61  Pulse 73 67 61    12.  History of prostate cancer.  Continue Proscar  5 mg daily  13.  Atrial fibrillation.  Cardiac rate controlled.  continue Librexia trial drug per cardiology services as well as Toprol -XL 12.5 mg every 12 hours  14.  Hyperlipidemia: continue Crestor /Zetia   15.  Elevated LFTs: d/c tylenol   16. Tachypnea: resolved, discussed that CXR is stable  17. Left groin pain: discussed that hip XR shows no acute abnormalities  LOS: 8 days A FACE TO FACE EVALUATION WAS PERFORMED  Keven Pel Annibelle Brazie 08/04/2023, 9:34 AM

## 2023-08-04 NOTE — Progress Notes (Shared)
 Occupational Therapy Discharge Summary  Patient Details  Name: Mahad Newstrom MRN: [id number] Date of Birth: [birth year]  Date of Discharge from OT service:{Time; dates multiple:[id number]}  Patient has met {NUMBERS 0-12:18577} of {NUMBERS 0-12:18577} long term goals due to {due WU:1324401}.  Patient to discharge at overall Supervision to min A level.  Patient's care partner is independent to provide the necessary physical assistance at discharge.    Reasons goals not met: ***  Recommendation:  Patient will benefit from ongoing skilled OT services in outpatient setting to continue to advance functional skills in the area of BADL, iADL, and Reduce care partner burden.  Equipment: No equipment provided  Reasons for discharge: treatment goals met and discharge from hospital  Patient/family agrees with progress made and goals achieved: Yes  OT Discharge Precautions/Restrictions  Precautions Precautions: Fall;Other (comment) Precaution/Restrictions Comments: BLE wounds Restrictions Weight Bearing Restrictions Per Provider Order: No ADL ADL Eating: Independent Where Assessed-Eating: Wheelchair Grooming: Modified independent Where Assessed-Grooming: Sitting at sink Upper Body Bathing: Setup Where Assessed-Upper Body Bathing: Shower Lower Body Bathing: Contact guard Where Assessed-Lower Body Bathing: Shower Upper Body Dressing: Modified independent (Device) Where Assessed-Upper Body Dressing: Wheelchair Lower Body Dressing: Minimal assistance Where Assessed-Lower Body Dressing: Sitting at sink, Standing at sink Toileting: Supervision/safety Where Assessed-Toileting: Bedside Commode, Actuary Transfer: Close supervision Toilet Transfer Method: Proofreader: Gaffer: Unable to assess Tub/Shower Transfer Method: Unable to assess Film/video editor: Administrator, arts Method: Musician: Information systems manager with back Vision Baseline Vision/History: 1 Wears glasses Patient Visual Report: No change from baseline Vision Assessment?: No apparent visual deficits Perception  Perception: Within Functional Limits Praxis Praxis: WFL Cognition Cognition Overall Cognitive Status: Within Functional Limits for tasks assessed Arousal/Alertness: Awake/alert Orientation Level: Person;Place;Situation Person: Oriented Place: Oriented Situation: Oriented Memory: Appears intact Awareness: Appears intact Problem Solving: Appears intact Brief Interview for Mental Status (BIMS) Repetition of Three Words (First Attempt): 3 Temporal Orientation: Year: Correct Temporal Orientation: Month: Accurate within 5 days Temporal Orientation: Day: Correct Recall: "Sock": Yes, no cue required Recall: "Blue": Yes, no cue required Recall: "Bed": Yes, no cue required BIMS Summary Score: 15 Sensation   Motor    Mobility     Trunk/Postural Assessment     Balance   Extremity/Trunk Assessment RUE Assessment RUE Assessment: Within Functional Limits LUE Assessment LUE Assessment: Within Functional Limits   Yuriy Cui E Wesly Whisenant 08/04/2023, 8:45 AM

## 2023-08-04 NOTE — Progress Notes (Signed)
 Physical Therapy Weekly Progress Note  Patient Details  Name: Logan French MRN: [id number] Date of Birth: [birth year]  Beginning of progress report period: Jul 28, 2023 End of progress report period: Aug 04, 2023  Patient has met 2 of 8 long term goals. Pt demonstrates slow progress towards long term goals and discharge date has been extended to 5/27. Pt has recently been limited by flare up of pain in groin and discomfort and tightness in LLE. Normally pt is able to perform bed mobility with supervision, but on 5/20-5/21 required mod/max A for bed mobility, CGA/light min A for transfers with RW, CGA/close supervision to ambulate 170ft with RW, and CGA to navigate 8 6in steps with 2 handrails. Pt has recently experienced increased difficulty with L knee flexion due to "tightness" and increasing pain. Also noticed increased weeping from LLE on 5/21 with WOC consulted. Family present for brief education session on 5/21 but will require additional education prior to discharge.   Patient continues to demonstrate the following deficits muscle weakness and muscle joint tightness, decreased cardiorespiratoy endurance, and decreased standing balance, decreased postural control, and decreased balance strategies and therefore will continue to benefit from skilled PT intervention to increase functional independence with mobility.  Patient progressing toward long term goals..  Continue plan of care.  PT Short Term Goals Week 1:  PT Short Term Goal 1 (Week 1): STG=LTG due to LOS Week 2:  PT Short Term Goal 1 (Week 2): STG=LTG due to LOS  Skilled Therapeutic Interventions/Progress Updates:  Ambulation/gait training;Cognitive remediation/compensation;Discharge planning;DME/adaptive equipment instruction;Functional mobility training;Pain management;Psychosocial support;Therapeutic Activities;Splinting/orthotics;UE/LE Strength taining/ROM;Visual/perceptual remediation/compensation;Wheelchair  propulsion/positioning;UE/LE Coordination activities;Therapeutic Exercise;Stair training;Skin care/wound management;Patient/family education;Neuromuscular re-education;Functional electrical stimulation;Disease management/prevention;Community reintegration;Balance/vestibular training   Therapy Documentation Precautions:  Precautions Precautions: Fall, Other (comment) Precaution/Restrictions Comments: BLE wounds Restrictions Weight Bearing Restrictions Per Provider Order: No  Therapy/Group: Individual Therapy Nicolas Barren Zaunegger Nena Bank PT, DPT 08/04/2023, 12:21 PM

## 2023-08-05 ENCOUNTER — Inpatient Hospital Stay (HOSPITAL_COMMUNITY)

## 2023-08-05 LAB — CBC WITH DIFFERENTIAL/PLATELET
Abs Immature Granulocytes: 0.07 10*3/uL (ref 0.00–0.07)
Abs Immature Granulocytes: 0.07 10*3/uL (ref 0.00–0.07)
Basophils Absolute: 0.1 10*3/uL (ref 0.0–0.1)
Basophils Absolute: 0.1 10*3/uL (ref 0.0–0.1)
Basophils Relative: 1 %
Basophils Relative: 1 %
Eosinophils Absolute: 0.1 10*3/uL (ref 0.0–0.5)
Eosinophils Absolute: 0 10*3/uL (ref 0.0–0.5)
Eosinophils Relative: 1 %
Eosinophils Relative: 0 %
HCT: 34.5 % — ABNORMAL LOW (ref 39.0–52.0)
HCT: 37.8 % — ABNORMAL LOW (ref 39.0–52.0)
Hemoglobin: 11.2 g/dL — ABNORMAL LOW (ref 13.0–17.0)
Hemoglobin: 12.2 g/dL — ABNORMAL LOW (ref 13.0–17.0)
Immature Granulocytes: 1 %
Immature Granulocytes: 1 %
Lymphocytes Relative: 13 %
Lymphocytes Relative: 9 %
Lymphs Abs: 1.3 10*3/uL (ref 0.7–4.0)
Lymphs Abs: 1 10*3/uL (ref 0.7–4.0)
MCH: 28.1 pg (ref 26.0–34.0)
MCH: 27.7 pg (ref 26.0–34.0)
MCHC: 32.5 g/dL (ref 30.0–36.0)
MCHC: 32.3 g/dL (ref 30.0–36.0)
MCV: 86.5 fL (ref 80.0–100.0)
MCV: 85.9 fL (ref 80.0–100.0)
Monocytes Absolute: 0.9 10*3/uL (ref 0.1–1.0)
Monocytes Absolute: 0.9 10*3/uL (ref 0.1–1.0)
Monocytes Relative: 9 %
Monocytes Relative: 8 %
Neutro Abs: 7.6 10*3/uL (ref 1.7–7.7)
Neutro Abs: 9.7 10*3/uL — ABNORMAL HIGH (ref 1.7–7.7)
Neutrophils Relative %: 75 %
Neutrophils Relative %: 81 %
Platelets: 368 10*3/uL (ref 150–400)
Platelets: 458 10*3/uL — ABNORMAL HIGH (ref 150–400)
RBC: 3.99 MIL/uL — ABNORMAL LOW (ref 4.22–5.81)
RBC: 4.4 MIL/uL (ref 4.22–5.81)
RDW: 17.2 % — ABNORMAL HIGH (ref 11.5–15.5)
RDW: 17.2 % — ABNORMAL HIGH (ref 11.5–15.5)
WBC: 10.1 10*3/uL (ref 4.0–10.5)
WBC: 11.8 10*3/uL — ABNORMAL HIGH (ref 4.0–10.5)
nRBC: 0 % (ref 0.0–0.2)
nRBC: 0 % (ref 0.0–0.2)

## 2023-08-05 LAB — BASIC METABOLIC PANEL WITH GFR
Anion gap: 14 (ref 5–15)
BUN: 14 mg/dL (ref 8–23)
CO2: 20 mmol/L — ABNORMAL LOW (ref 22–32)
Calcium: 8.7 mg/dL — ABNORMAL LOW (ref 8.9–10.3)
Chloride: 96 mmol/L — ABNORMAL LOW (ref 98–111)
Creatinine, Ser: 1.2 mg/dL (ref 0.61–1.24)
GFR, Estimated: 59 mL/min — ABNORMAL LOW (ref >60)
Glucose, Bld: 179 mg/dL — ABNORMAL HIGH (ref 70–99)
Potassium: 4.3 mmol/L (ref 3.5–5.1)
Sodium: 130 mmol/L — ABNORMAL LOW (ref 135–145)

## 2023-08-05 NOTE — Progress Notes (Signed)
 Occupational Therapy Session Note  Patient Details  Name: Logan French MRN: [id number] Date of Birth: 12-21-1934  Today's Date: 08/05/2023 OT Individual Time: 7829-5621 OT Individual Time Calculation (min): 70 min    Short Term Goals: Week 2:  OT Short Term Goal 1 (Week 2): STG = LTG due to ELOS  Skilled Therapeutic Interventions/Progress Updates:    Pt resting in w/c upon arrival. See below for pain. Pt concerned about pain in groin and reports MD aware and addressing. Discussed pt's current funcitonal status and current lack of progress with sit<>stand. Pt declined sit<>stand this session 2/2 increased pain. Pt voiced concern about UE strength and agreeable to BUE therex. Pt transitioned to ortho gym. UBE 15 minsx2 level 4 in opposite directions. Extended rest break. Pt returned to room and remained in w/c. Heat pack provided per below. All needs within reach.   Therapy Documentation Precautions:  Precautions Precautions: Fall, Other (comment) Precaution/Restrictions Comments: BLE wounds Restrictions Weight Bearing Restrictions Per Provider Order: No   Pain:  Pt reports LLE/ankle pain at rest (unrated). Pt also reports Lt groin pain; heat pack provided at end of session    Therapy/Group: Individual Therapy  [name] 08/05/2023, 1:10 PM

## 2023-08-05 NOTE — Progress Notes (Signed)
 PROGRESS NOTE   Subjective/Complaints: Ready to give his best today Feels family training went well yesterday Right now he wants to focus on getting out of his bed without asisstance   ROS: Patient denies fever, chills, shortness of breath, chest pain, abdominal pain.  +RLE swelling, improved +Left lower extremity swelling and big toe ulceration +malaise +left groin pain- improved    Objective:   DG HIP UNILAT WITH PELVIS 1V LEFT Result Date: 08/03/2023 CLINICAL DATA:  Left hip pain EXAM: DG HIP (WITH OR WITHOUT PELVIS) 3V COMPARISON:  None Available. FINDINGS: Pelvic ring is intact. Degenerative changes of the hip joints are noted bilaterally. No acute fracture or dislocation is noted. Mild soft tissue prominence is noted laterally which may be related to subcutaneous edema possibly from recent fall. Correlate with clinical history. IMPRESSION: No acute fracture noted. Mild soft tissue swelling laterally. Electronically Signed   By: Violeta Grey M.D.   On: 08/03/2023 20:19   DG Chest 2 View Result Date: 08/03/2023 CLINICAL DATA:  Tachypnea EXAM: CHEST - 2 VIEW COMPARISON:  07/15/2023 FINDINGS: Cardiac shadow is enlarged but stable. Aortic calcifications are noted. The lungs are well aerated bilaterally. No focal infiltrate or effusion is noted. No acute bony abnormality is seen. IMPRESSION: No active cardiopulmonary disease. Electronically Signed   By: Violeta Grey M.D.   On: 08/03/2023 20:18   Recent Labs    08/04/23 0505 08/05/23 0652  WBC 8.8 10.1  HGB 10.9* 11.2*  HCT 33.8* 34.5*  PLT 356 368    Recent Labs    08/03/23 0546 08/04/23 0505  NA 134* 133*  K 4.2 3.8  CL 102 103  CO2 24 23  GLUCOSE 100* 120*  BUN 10 10  CREATININE 0.96 0.84  CALCIUM  8.5* 8.1*     Intake/Output Summary (Last 24 hours) at 08/05/2023 0858 Last data filed at 08/05/2023 0108 Gross per 24 hour  Intake --  Output 575 ml  Net -575  ml        Physical Exam: Vital Signs Blood pressure 122/72, pulse 66, temperature 98.6 F (37 C), temperature source Oral, resp. rate 18, height 6' (1.829 m), weight 93.7 kg, SpO2 100%.   General: Lying in bed, appears unwell HEENT: Head is normocephalic, atraumatic, PERRLA, EOMI, sclera anicteric, oral mucosa pink and moist Neck: Supple without JVD or lymphadenopathy Heart: Reg rate and rhythm. Chest: CTA bilaterally without wheezes, rales, or rhonchi; no distress Abdomen: Soft, non-tender, non-distended, bowel sounds positive. Psych: Pt's affect is appropriate. Pt is cooperative Skin: Right Achilles gauze dressing with wound healing, no apparent erythema, borders starting to close. Left anterior shin and foot covered in dry gauze, remain open with some mild erythema and induration but improving prior image         MSK:    Left lower extremity mildly tender to palpation on anterior shin and ankle.  Otherwise, full active range of motion in all 4 extremities.  Neuro:     Comments: Alert and oriented x 3. Normal insight and awareness. Intact Memory. Normal language and speech. Cranial nerve exam unremarkable. MMT: BUE grossly 4/5 prox to distal. BLE 3+HF and KE and 4- ADF/PF. Mild sensory  loss distally in both legs but otherwise sensory exam appears intact. DTR's 1+. No abnl resting tone.    Stable 5/22   Assessment/Plan: 1. Functional deficits which require 3+ hours per day of interdisciplinary therapy in a comprehensive inpatient rehab setting. Physiatrist is providing close team supervision and 24 hour management of active medical problems listed below. Physiatrist and rehab team continue to assess barriers to discharge/monitor patient progress toward functional and medical goals  Care Tool:  Bathing    Body parts bathed by patient: Right arm, Left arm, Chest, Abdomen, Front perineal area, Right upper leg, Left upper leg, Face   Body parts bathed by helper: Buttocks,  Right lower leg Body parts n/a: Left lower leg   Bathing assist Assist Level: Minimal Assistance - Patient > 75%     Upper Body Dressing/Undressing Upper body dressing   What is the patient wearing?: Pull over shirt    Upper body assist Assist Level: Set up assist    Lower Body Dressing/Undressing Lower body dressing      What is the patient wearing?: Incontinence brief, Pants     Lower body assist Assist for lower body dressing: Minimal Assistance - Patient > 75%     Toileting Toileting Toileting Activity did not occur (Clothing management and hygiene only): N/A (no void or bm)  Toileting assist Assist for toileting: Maximal Assistance - Patient 25 - 49%     Transfers Chair/bed transfer  Transfers assist  Chair/bed transfer activity did not occur: Safety/medical concerns  Chair/bed transfer assist level: Supervision/Verbal cueing Chair/bed transfer assistive device: Arboriculturist assist      Assist level: Supervision/Verbal cueing Assistive device: Walker-rolling Max distance: 150+   Walk 10 feet activity   Assist     Assist level: Supervision/Verbal cueing Assistive device: Walker-rolling   Walk 50 feet activity   Assist    Assist level: Supervision/Verbal cueing Assistive device: Walker-rolling    Walk 150 feet activity   Assist Walk 150 feet activity did not occur: Safety/medical concerns (fatigue, discomofort in LLE)  Assist level: Supervision/Verbal cueing Assistive device: Walker-rolling    Walk 10 feet on uneven surface  activity   Assist     Assist level: Minimal Assistance - Patient > 75% Assistive device: Development worker, international aid     Assist Is the patient using a wheelchair?: Yes Type of Wheelchair: Manual    Wheelchair assist level: Supervision/Verbal cueing Max wheelchair distance: 165    Wheelchair 50 feet with 2 turns activity    Assist        Assist Level:  Supervision/Verbal cueing   Wheelchair 150 feet activity     Assist      Assist Level: Supervision/Verbal cueing   Blood pressure 122/72, pulse 66, temperature 98.6 F (37 C), temperature source Oral, resp. rate 18, height 6' (1.829 m), weight 93.7 kg, SpO2 100%.  Medical Problem List and Plan: 1. Functional deficits secondary to sepsis secondary to cellulitis.  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  Plan follow-up Dopplers 4 to 6 weeks.             -patient may  shower if left leg is covered             -ELOS/Goals: 7-10 days, supervision to mod I with PT and sup/min with OT  -Continue CIR  Discussed plan for team meeting today  2.  Antithrombotics: -DVT/anticoagulation:  Mechanical: Antiembolism stockings, thigh (  TED hose) Bilateral lower extremities             -antiplatelet therapy: Plavix  75 mg daily 3. Pain Management: Oxycodone  as needed             -pain seems fairly controlled 4. Mood/Behavior/Sleep: Provide emotional support             -antipsychotic agents: N/A 5. Neuropsych/cognition: This patient is capable of making decisions on his own behalf. 6. Wounds on both legs appear to be healing nicely.              -continue dry dressings with kerlix to left calf and foot as well as right achilles area, asked nursing to change dressing today  -5/14 seen by [facility]- for leg wounds  -5/18 images updated for the chart  7. Fluids/Electrolytes/Nutrition: Routine in and outs with follow-up chemistries  - 5-19: Labs back with reduced NA 132 and increased K 5.3.  Fairly elevated and asymptomatic, will repeat in a.m. and consider Lokelma.  Did encourage p.o. fluids with patient today.  8.  ID/cellulitis..  Completing course of Augmentin /doxycycline  9.  AKI.  Resolved.  Follow-up chemistries  - 5/14 BUN/creatinine stable  5-19: BUN/creatinine stable  10.  Diabetes mellitus.  Hemoglobin A1c 6.4. d/c ISS  - Blood  sugars well-controlled on BMPs   11.  Hypertension.  D/c norvasc                  08/05/2023    6:06 AM 08/04/2023    7:47 PM 08/04/2023    6:20 PM  Vitals with BMI  Systolic 122 104 829  Diastolic 72 84 68  Pulse 66 71 64    12.  History of prostate cancer.  Continue Proscar  5 mg daily  13.  Atrial fibrillation.  Cardiac rate controlled.  Continue Librexia trial drug per cardiology services as well as Toprol -XL 12.5 mg every 12 hours  14.  Hyperlipidemia: continue Crestor /Zetia   15.  Elevated LFTs: d/c tylenol   16. Tachypnea: resolved, discussed that CXR is stable  17. Left groin pain: discussed that hip XR shows no acute abnormalities  LOS: 9 days A FACE TO FACE EVALUATION WAS PERFORMED  Keven Pel Balbina Depace 08/05/2023, 8:58 AM

## 2023-08-05 NOTE — Progress Notes (Signed)
 PROGRESS NOTE   Subjective/Complaints: Worsening left knee flexion noted with therapy, as well as increased difficulty with stairs Patient felt that family training went well yesterday   ROS: Patient denies fever, chills, shortness of breath, chest pain, abdominal pain.  +RLE swelling, improved +Left lower extremity swelling and big toe ulceration +malaise +left groin pain- improved Decreased left knee flexion as per therapy    Objective:   DG HIP UNILAT WITH PELVIS 1V LEFT Result Date: 08/03/2023 CLINICAL DATA:  Left hip pain EXAM: DG HIP (WITH OR WITHOUT PELVIS) 3V COMPARISON:  None Available. FINDINGS: Pelvic ring is intact. Degenerative changes of the hip joints are noted bilaterally. No acute fracture or dislocation is noted. Mild soft tissue prominence is noted laterally which may be related to subcutaneous edema possibly from recent fall. Correlate with clinical history. IMPRESSION: No acute fracture noted. Mild soft tissue swelling laterally. Electronically Signed   By: Violeta Grey M.D.   On: 08/03/2023 20:19   DG Chest 2 View Result Date: 08/03/2023 CLINICAL DATA:  Tachypnea EXAM: CHEST - 2 VIEW COMPARISON:  07/15/2023 FINDINGS: Cardiac shadow is enlarged but stable. Aortic calcifications are noted. The lungs are well aerated bilaterally. No focal infiltrate or effusion is noted. No acute bony abnormality is seen. IMPRESSION: No active cardiopulmonary disease. Electronically Signed   By: Violeta Grey M.D.   On: 08/03/2023 20:18   Recent Labs    08/05/23 0652 08/05/23 1046  WBC 10.1 11.8*  HGB 11.2* 12.2*  HCT 34.5* 37.8*  PLT 368 458*    Recent Labs    08/04/23 0505 08/05/23 1046  NA 133* 130*  K 3.8 4.3  CL 103 96*  CO2 23 20*  GLUCOSE 120* 179*  BUN 10 14  CREATININE 0.84 1.20  CALCIUM  8.1* 8.7*     Intake/Output Summary (Last 24 hours) at 08/05/2023 1506 Last data filed at 08/05/2023 1300 Gross per  24 hour  Intake 480 ml  Output 575 ml  Net -95 ml        Physical Exam: Vital Signs Blood pressure 119/78, pulse 62, temperature 98.3 F (36.8 C), temperature source Oral, resp. rate 17, height 6' (1.829 m), weight 93.7 kg, SpO2 99%.   General: Lying in bed, appears unwell HEENT: Head is normocephalic, atraumatic, PERRLA, EOMI, sclera anicteric, oral mucosa pink and moist Neck: Supple without JVD or lymphadenopathy Heart: Reg rate and rhythm. Chest: CTA bilaterally without wheezes, rales, or rhonchi; no distress Abdomen: Soft, non-tender, non-distended, bowel sounds positive. Psych: Pt's affect is appropriate. Pt is cooperative Skin: Right Achilles gauze dressing with wound healing, no apparent erythema, borders starting to close. Left anterior shin and foot covered in dry gauze, remain open with some mild erythema and induration but improving prior image         MSK:    Left lower extremity mildly tender to palpation on anterior shin and ankle.  Otherwise, full active range of motion in all 4 extremities.  Neuro:     Comments: Alert and oriented x 3. Normal insight and awareness. Intact Memory. Normal language and speech. Cranial nerve exam unremarkable. MMT: BUE grossly 4/5 prox to distal. BLE 3+HF and KE and  4- ADF/PF. Mild sensory loss distally in both legs but otherwise sensory exam appears intact. DTR's 1+. No abnl resting tone.    Stable 5/22   Assessment/Plan: 1. Functional deficits which require 3+ hours per day of interdisciplinary therapy in a comprehensive inpatient rehab setting. Physiatrist is providing close team supervision and 24 hour management of active medical problems listed below. Physiatrist and rehab team continue to assess barriers to discharge/monitor patient progress toward functional and medical goals  Care Tool:  Bathing    Body parts bathed by patient: Right arm, Left arm, Chest, Abdomen, Front perineal area, Right upper leg, Left upper  leg, Face   Body parts bathed by helper: Buttocks, Right lower leg Body parts n/a: Left lower leg   Bathing assist Assist Level: Minimal Assistance - Patient > 75%     Upper Body Dressing/Undressing Upper body dressing   What is the patient wearing?: Pull over shirt    Upper body assist Assist Level: Set up assist    Lower Body Dressing/Undressing Lower body dressing      What is the patient wearing?: Incontinence brief, Pants     Lower body assist Assist for lower body dressing: Minimal Assistance - Patient > 75%     Toileting Toileting Toileting Activity did not occur (Clothing management and hygiene only): N/A (no void or bm)  Toileting assist Assist for toileting: Maximal Assistance - Patient 25 - 49%     Transfers Chair/bed transfer  Transfers assist  Chair/bed transfer activity did not occur: Safety/medical concerns  Chair/bed transfer assist level: Supervision/Verbal cueing Chair/bed transfer assistive device: Arboriculturist assist      Assist level: Supervision/Verbal cueing Assistive device: Walker-rolling Max distance: 150+   Walk 10 feet activity   Assist     Assist level: Supervision/Verbal cueing Assistive device: Walker-rolling   Walk 50 feet activity   Assist    Assist level: Supervision/Verbal cueing Assistive device: Walker-rolling    Walk 150 feet activity   Assist Walk 150 feet activity did not occur: Safety/medical concerns (fatigue, discomofort in LLE)  Assist level: Supervision/Verbal cueing Assistive device: Walker-rolling    Walk 10 feet on uneven surface  activity   Assist     Assist level: Minimal Assistance - Patient > 75% Assistive device: Development worker, international aid     Assist Is the patient using a wheelchair?: Yes Type of Wheelchair: Manual    Wheelchair assist level: Supervision/Verbal cueing Max wheelchair distance: 165    Wheelchair 50 feet with 2 turns  activity    Assist        Assist Level: Supervision/Verbal cueing   Wheelchair 150 feet activity     Assist      Assist Level: Supervision/Verbal cueing   Blood pressure 119/78, pulse 62, temperature 98.3 F (36.8 C), temperature source Oral, resp. rate 17, height 6' (1.829 m), weight 93.7 kg, SpO2 99%.  Medical Problem List and Plan: 1. Functional deficits secondary to sepsis secondary to cellulitis.  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  Plan follow-up Dopplers 4 to 6 weeks.             -patient may  shower if left leg is covered             -ELOS/Goals: 7-10 days, supervision to mod I with PT and sup/min with OT  -Continue CIR  Discussed plan for team meeting today  2.  Antithrombotics: -DVT/anticoagulation:  Mechanical: Antiembolism stockings, thigh (TED hose) Bilateral lower extremities             -antiplatelet therapy: Plavix  75 mg daily 3. Pain Management: Oxycodone  as needed             -pain seems fairly controlled 4. Mood/Behavior/Sleep: Provide emotional support             -antipsychotic agents: N/A 5. Neuropsych/cognition: This patient is capable of making decisions on his own behalf. 6. Wounds on both legs appear to be healing nicely.              -continue dry dressings with kerlix to left calf and foot as well as right achilles area, asked nursing to change dressing today  -5/14 seen by [facility]- for leg wounds  -5/18 images updated for the chart  7. Fluids/Electrolytes/Nutrition: Routine in and outs with follow-up chemistries  - 5-19: Labs back with reduced NA 132 and increased K 5.3.  Fairly elevated and asymptomatic, will repeat in a.m. and consider Lokelma.  Did encourage p.o. fluids with patient today.  8.  ID/cellulitis..  Completing course of Augmentin /doxycycline  9.  AKI.  Resolved.  Follow-up chemistries  - 5/14 BUN/creatinine stable  5-19: BUN/creatinine stable  10.  Diabetes  mellitus.  Hemoglobin A1c 6.4. d/c ISS  - Blood sugars well-controlled on BMPs   11.  Hypertension.  D/c norvasc                  08/05/2023    1:26 PM 08/05/2023    6:06 AM 08/04/2023    7:47 PM  Vitals with BMI  Systolic 119 122 308  Diastolic 78 72 84  Pulse 62 66 71    12.  History of prostate cancer.  Continue Proscar  5 mg daily  13.  Atrial fibrillation.  Cardiac rate controlled.  Continue Librexia trial drug per cardiology services as well as Toprol -XL 12.5 mg every 12 hours  14.  Hyperlipidemia: continue Crestor /Zetia   15.  Elevated LFTs: d/c tylenol   16. Tachypnea: resolved, discussed that CXR is stable  17. Left groin pain: discussed that hip XR shows no acute abnormalities, recommended applying kpad  18. Hyponatremia: repeat BMP ordered for tomorrow to trend  19. Leukocytosis: repeat CBC ordered tomorrow to trend  20. Decreased left knee flexion: XR ordered  LOS: 9 days A FACE TO FACE EVALUATION WAS PERFORMED  Lavell Portugal P Alverna Fawley 08/05/2023, 3:06 PM

## 2023-08-05 NOTE — Patient Care Conference (Signed)
 Inpatient RehabilitationTeam Conference and Plan of Care Update Date: 08/04/2023   Time: 1153 am    Patient Name: Logan French      Medical Record Number: [id number]  Date of Birth: 1935-02-03 Sex: Male         Room/Bed: 4M05C/4M05C-01 Payor Info: Payor: MEDICARE / Plan: MEDICARE PART A AND B / Product Type: *No Product type* /    Admit Date/Time:  07/27/2023  3:18 PM  Primary Diagnosis:  Cellulitis  Hospital Problems: Principal Problem:   Cellulitis    Expected Discharge Date: Expected Discharge Date: 08/10/23  Team Members Present: Physician leading conference: Dr. Laverle Postin Social Worker Present: Adrianna Albee, LCSW Nurse Present: Jerene Monks, RN PT Present: Nena Bank, PT OT Present: Tim Fontana, OT PPS Coordinator present : Jestine Moron, SLP     Current Status/Progress Goal Weekly Team Focus  Bowel/Bladder   pt continent of b/b   Remain continent   Assist with toileting needs qshift and prn    Swallow/Nutrition/ Hydration               ADL's   Set up A UB, Min A LB, CGA/min A toileting   Min assist BADL   LLE pain/stiffness, functional endurance, dynamic standing balance    Mobility   bed mobility supervision, transfers with RW CGA/supervision, gait 167ft with RW supervision 4 6in steps with 1 handrail CGA   Mod I  barriers: LLE discomfort/edema, generalized weakness/deconditioning, decreased balance/coordination    Communication                Safety/Cognition/ Behavioral Observations               Pain   no c/o pain   Remain pain free   Assess qshift and prn    Skin   Pt has open blisters on left foot, see orders for wound care   Maintain skin integrity  Assess qshift and prn      Discharge Planning:  Home with wife who can assist some, family education today with wife and son from [place]. Other son to hire some assist. Will he be ready on Friday for DC?    Team Discussion: Patient admitted with debility post  cellulitis with AKI and history of DM.Patient limited by bilateral lower extremity edema , left hip pain and knee pain.    Patient on target to meet rehab goals: yes, currently patient needs set up assist with upper body care and min assist with lower body care.Needs supervision with transfers  using a rolling walker. Patient ambulates up to 150' using a RW with supervision assist. Overall goals at discharge are set for min-mod I assistance.   *See Care Plan and progress notes for long and short-term goals.   Revisions to Treatment Plan:  Wound Ostomy consult   Teaching Needs: Safety, skin care/wound care, medications, transfers, toileting, etc   Current Barriers to Discharge: Decreased caregiver support, Home enviroment access/layout, and Wound care  Possible Resolutions to Barriers: Family Education OP follow up at [employer] Summary Current Status: bilateral lower extremity edema, HTN, afib, constipation, HLD  Barriers to Discharge: Medical stability  Barriers to Discharge Comments: bilateral lower extremity edema, HTN, afib, constipation, HLD Possible Resolutions to [employer] Focus: continue ace wrap, wound care consulted, continue amlodipine , continue metoprolol , continue senna-docusate, contineu crestor    Continued Need for Acute Rehabilitation Level of Care: The patient requires daily medical management by a physician with specialized training in  physical medicine and rehabilitation for the following reasons: Direction of a multidisciplinary physical rehabilitation program to maximize functional independence : Yes Medical management of patient stability for increased activity during participation in an intensive rehabilitation regime.: Yes Analysis of laboratory values and/or radiology reports with any subsequent need for medication adjustment and/or medical intervention. : Yes   I attest that I was present, lead the team conference, and concur  with the assessment and plan of the team.   [name] Bodmer Gayo 08/04/2023, 1153 am

## 2023-08-05 NOTE — Progress Notes (Signed)
 Occupational Therapy Session Note  Patient Details  Name: Logan French MRN: [id number] Date of Birth: Nov 29, 1934  Today's Date: 08/05/2023 OT Individual Time: 1330-1400 OT Individual Time Calculation (min): 30 min    Short Term Goals: Week 1:  OT Short Term Goal 1 (Week 1): Pt will complete sit > stand in prep for ADL with CGA using LRAD OT Short Term Goal 1 - Progress (Week 1): Met OT Short Term Goal 2 (Week 1): Pt will engage in ADL task at sink in stance for 3 mins with min A to promote standing tolerance OT Short Term Goal 2 - Progress (Week 1): Met OT Short Term Goal 3 (Week 1): Pt will utilize AE PRN to thread LB clothing with supervision OT Short Term Goal 3 - Progress (Week 1): Not met Week 2:  OT Short Term Goal 1 (Week 2): STG = LTG due to ELOS  Skilled Therapeutic Interventions/Progress Updates:    1:1 Pt recievedin the w/c and reported his left [place] is hurting a lot today - team already aware. Pt required max A to come into standing but then very slowly was able to ambulate from his room to the ADL apartment. Pt reports his left LE feels so heavy to pick up. Practiced stepping over a shower threshold (backwards in and forwards out) with RW for UE support. PT able to complete with min A . Second time standing from w/c required mod A.   Pt also asked about a home eval/ fam education at his house- discussed concerns of family and later discussed with team. Pt left sitting up with LEs elevated for next session.   Therapy Documentation Precautions:  Precautions Precautions: Fall, Other (comment) Precaution/Restrictions Comments: BLE wounds Restrictions Weight Bearing Restrictions Per Provider Order: No  Pain: Ongoing pain in left LE asked for meds - reported to nursing   Therapy/Group: Individual Therapy  [name] [facility] 08/05/2023, 3:24 PM

## 2023-08-05 NOTE — Plan of Care (Signed)
  Problem: Consults Goal: RH GENERAL PATIENT EDUCATION Description: See Patient Education module for education specifics. Outcome: Progressing   Problem: RH BOWEL ELIMINATION Goal: RH STG MANAGE BOWEL WITH ASSISTANCE Description: STG Manage Bowel with toileting Assistance. Outcome: Progressing Goal: RH STG MANAGE BOWEL W/MEDICATION W/ASSISTANCE Description: STG Manage Bowel with Medication with mod I Assistance. Outcome: Progressing   Problem: RH BLADDER ELIMINATION Goal: RH STG MANAGE BLADDER WITH ASSISTANCE Description: STG Manage Bladder With Assistance Outcome: Progressing Goal: RH STG MANAGE BLADDER WITH MEDICATION WITH ASSISTANCE Description: STG Manage Bladder With Medication With mod I Assistance. Outcome: Progressing   Problem: RH SKIN INTEGRITY Goal: RH STG SKIN FREE OF INFECTION/BREAKDOWN Description: Manage skin/wounds w min assist Outcome: Progressing Goal: RH STG MAINTAIN SKIN INTEGRITY WITH ASSISTANCE Description: STG Maintain Skin Integrity With min Assistance. Outcome: Progressing Goal: RH STG ABLE TO PERFORM INCISION/WOUND CARE W/ASSISTANCE Description: STG Able To Perform Incision/Wound Care With min Assistance. Outcome: Progressing   Problem: RH SAFETY Goal: RH STG ADHERE TO SAFETY PRECAUTIONS W/ASSISTANCE/DEVICE Description: STG Adhere to Safety Precautions With cues Assistance/Device. Outcome: Progressing   Problem: RH PAIN MANAGEMENT Goal: RH STG PAIN MANAGED AT OR BELOW PT'S PAIN GOAL Description: < 4 with prns Outcome: Progressing   Problem: RH KNOWLEDGE DEFICIT GENERAL Goal: RH STG INCREASE KNOWLEDGE OF SELF CARE AFTER HOSPITALIZATION Description: Patient and family will be able to manage care at discharge using educational resources independently Outcome: Progressing

## 2023-08-05 NOTE — Progress Notes (Signed)
 Physical Therapy Session Note  Patient Details  Name: Logan French MRN: [id number] Date of Birth: 1934-06-06  Today's Date: 08/05/2023 PT Individual Time: 8657-8469 and 1430-1500 PT Individual Time Calculation (min): 55 min and 30 min  Short Term Goals: Week 1:  PT Short Term Goal 1 (Week 1): STG=LTG due to LOS Week 2:  PT Short Term Goal 1 (Week 2): STG=LTG due to LOS  Skilled Therapeutic Interventions/Progress Updates:   Treatment Session 1 Received pt sitting on commode. Pt reported he is "not improved" since yesterday but agreeable to PT treatment. Pt reported pain 1/10 in L knee, increasing to 3/10 with mobility but still grimacing and appeared to be in more pain throughout (premedicated). Session with emphasis on functional mobility/transfers, generalized strengthening and endurance, dynamic standing balance/coordination, and ambulation. Pt ambulated from bathroom<>WC with RW and CGA. Donned clean brief and pants seated with max A, then stood from [facility] with heavy mod A with multiple trials and required max A to pull brief and pants over hips. Pt then ambulated 53ft with RW and CGA/close supervision to ortho gym. Took seated rest break on mat, then stood with RW and min A and ambulated 68ft with RW and supervision to Nustep (per pt request). Pt required assist to get BLE on/off footplates and performed seated BUE/BLE strengthening on Nustep at workload 3 increasing to 4 for 12.5 minutes for a total of 550 steps with emphasis on cardiovascular endurance and knee flexion ROM. Transferred Nustep<>WC with RW via stand<>pivot and returned to room. Pt requesting grounds pass to go outside with family - MD notified. Found pill on computer - RN notified. Concluded session with pt sitting in [facility] with all needs within reach awaiting upcoming OT session.   Treatment Session 2 Received pt sitting in WC, pt agreeable to PT treatment, and did not state pain level but feeling pain in LLE based off facial  expressions, grimacing, and breathing heavily. Previous OT reported pt's wife with concerns regarding discharge home and requesting home evaluation tomorrow. Called pt's wife to discuss working on getting in/out of his home, up/down the steps in/out of living room, and getting in/of of shower and pt's wife in agreement. Pt also verbalizing confidence performing these tasks but unsure if pt/wife will be able to manage based off pt's CLOF.  Pt transported to/from room in [facility] dependently for time management purposes. Stood at staircase from [facility] with mod A and navigated 4 6in steps with bilateral handrails and min A initially, fading to mod A when descending final step. Encouraged "up with the good, down with the bad" technique to prevent pt from having to manually move LLE with his hands. Pt ascending and descending with a step to pattern and limited by pain. Returned to room, stood from [facility] with RW and mod A, and performed stand<>pivot with RW and CGA back to bed. Removed shorts and shoes and transferred into supine with max A for BLE management. Concluded session with pt semi-reclined in bed, needs within reach, and bed alarm on.   Therapy Documentation Precautions:  Precautions Precautions: Fall, Other (comment) Precaution/Restrictions Comments: BLE wounds Restrictions Weight Bearing Restrictions Per Provider Order: No  Therapy/Group: Individual Therapy Nicolas Barren Zaunegger Nena Bank PT, DPT 08/05/2023, 6:57 AM

## 2023-08-05 NOTE — Progress Notes (Addendum)
 Patient ID: Logan French, male   DOB: 04/16/1934, 89 y.o.   MRN: [id number]  Wife and son from [place] were here yesterday to go through therapies with and it went well Pt seems to have more issues according to PT today and made MD aware of this. Goals of mod/I-some min with bathing and dressing. Discharge date 5/27. Will work on home health agency and any other needs.  12;13 PM Met with pt who reports [facility] will not see him due to do not see cellulitis. Discussed having home health due to can have a HHRN then. He is in agreement with this and then once Op is needed he goes to Neuro and Op is attached to this facility. He is currently having an x-ray on his knee. He does not feel ready to go home and is glad for the change in DC date to 5/27.   12:27 PM Have gotten [facility] to accept his home health referral for PT OT RN.

## 2023-08-06 LAB — BASIC METABOLIC PANEL WITH GFR
Anion gap: 6 (ref 5–15)
BUN: 12 mg/dL (ref 8–23)
CO2: 26 mmol/L (ref 22–32)
Calcium: 8.5 mg/dL — ABNORMAL LOW (ref 8.9–10.3)
Chloride: 102 mmol/L (ref 98–111)
Creatinine, Ser: 0.83 mg/dL (ref 0.61–1.24)
GFR, Estimated: >60 mL/min (ref >60)
Glucose, Bld: 101 mg/dL — ABNORMAL HIGH (ref 70–99)
Potassium: 4.1 mmol/L (ref 3.5–5.1)
Sodium: 134 mmol/L — ABNORMAL LOW (ref 135–145)

## 2023-08-06 LAB — CBC WITH DIFFERENTIAL/PLATELET
Abs Immature Granulocytes: 0.05 10*3/uL (ref 0.00–0.07)
Basophils Absolute: 0.1 10*3/uL (ref 0.0–0.1)
Basophils Relative: 1 %
Eosinophils Absolute: 0.1 10*3/uL (ref 0.0–0.5)
Eosinophils Relative: 1 %
HCT: 33.7 % — ABNORMAL LOW (ref 39.0–52.0)
Hemoglobin: 10.9 g/dL — ABNORMAL LOW (ref 13.0–17.0)
Immature Granulocytes: 1 %
Lymphocytes Relative: 21 %
Lymphs Abs: 1.6 10*3/uL (ref 0.7–4.0)
MCH: 27.8 pg (ref 26.0–34.0)
MCHC: 32.3 g/dL (ref 30.0–36.0)
MCV: 86 fL (ref 80.0–100.0)
Monocytes Absolute: 0.9 10*3/uL (ref 0.1–1.0)
Monocytes Relative: 11 %
Neutro Abs: 5.1 10*3/uL (ref 1.7–7.7)
Neutrophils Relative %: 65 %
Platelets: 368 10*3/uL (ref 150–400)
RBC: 3.92 MIL/uL — ABNORMAL LOW (ref 4.22–5.81)
RDW: 17.2 % — ABNORMAL HIGH (ref 11.5–15.5)
WBC: 7.7 10*3/uL (ref 4.0–10.5)
nRBC: 0 % (ref 0.0–0.2)

## 2023-08-06 MED ORDER — FINASTERIDE 2.5 MG HALF TABLET
2.5000 mg | ORAL_TABLET | Freq: Every day | ORAL | Status: DC
  Administered 2023-08-07 – 2023-08-10 (×4): 2.5 mg via ORAL
  Filled 2023-08-06 (×5): qty 1

## 2023-08-06 NOTE — Plan of Care (Signed)
  Problem: RH Tub/Shower Transfers Goal: LTG Patient will perform tub/shower transfers w/assist (OT) Description: LTG: Patient will perform tub/shower transfers with assist, with/without cues using equipment (OT) Flowsheets (Taken 08/06/2023 1356) LTG: Pt will perform tub/shower stall transfers with assistance level of: (downgraded for safety due to balance deficits) Contact Guard/Touching assist

## 2023-08-06 NOTE — Progress Notes (Signed)
 PROGRESS NOTE   Subjective/Complaints: Has home visit today Has no complaints Therapy says he is doing better today Genora Kidd son but there was no option to leave voicemail   ROS: Patient denies fever, chills, shortness of breath, chest pain, abdominal pain.  +RLE swelling, improved +Left lower extremity swelling and big toe ulceration +malaise +left groin pain- improved Decreased left knee flexion as per therapy    Objective:   DG Knee 1-2 Views Left Result Date: 08/05/2023 CLINICAL DATA:  Left knee pain with history of cellulitis EXAM: LEFT KNEE - 1-2 VIEW COMPARISON:  None Available. FINDINGS: No evidence of fracture, dislocation, or joint effusion. There is mild medial compartment joint space narrowing and osteophyte formation. There is medial and lateral compartment chondrocalcinosis. Peripheral vascular calcifications are present. Soft tissues are otherwise within normal limits. IMPRESSION: 1. No acute fracture or dislocation. 2. Mild medial compartment osteoarthritis. 3. Chondrocalcinosis. Electronically Signed   By: Tyron Gallon M.D.   On: 08/05/2023 16:20   Recent Labs    08/05/23 1046 08/06/23 0643  WBC 11.8* 7.7  HGB 12.2* 10.9*  HCT 37.8* 33.7*  PLT 458* 368    Recent Labs    08/05/23 1046 08/06/23 0643  NA 130* 134*  K 4.3 4.1  CL 96* 102  CO2 20* 26  GLUCOSE 179* 101*  BUN 14 12  CREATININE 1.20 0.83  CALCIUM  8.7* 8.5*     Intake/Output Summary (Last 24 hours) at 08/06/2023 1127 Last data filed at 08/06/2023 0800 Gross per 24 hour  Intake 840 ml  Output 952 ml  Net -112 ml        Physical Exam: Vital Signs Blood pressure 100/68, pulse 65, temperature 98.2 F (36.8 C), temperature source Oral, resp. rate 17, height 6' (1.829 m), weight 93.7 kg, SpO2 99%.   General: Lying in bed, appears unwell HEENT: Head is normocephalic, atraumatic, PERRLA, EOMI, sclera anicteric, oral mucosa  pink and moist Neck: Supple without JVD or lymphadenopathy Heart: Reg rate and rhythm. Chest: CTA bilaterally without wheezes, rales, or rhonchi; no distress Abdomen: Soft, non-tender, non-distended, bowel sounds positive. Psych: Pt's affect is appropriate. Pt is cooperative Skin: Right Achilles gauze dressing with wound healing, no apparent erythema, borders starting to close. Left anterior shin and foot covered in dry gauze, remain open with some mild erythema and induration but improving prior image         MSK:    Left lower extremity mildly tender to palpation on anterior shin and ankle.  Otherwise, full active range of motion in all 4 extremities.  Neuro:     Comments: Alert and oriented x 3. Normal insight and awareness. Intact Memory. Normal language and speech. Cranial nerve exam unremarkable. MMT: BUE grossly 4/5 prox to distal. BLE 3+HF and KE and 4- ADF/PF. Mild sensory loss distally in both legs but otherwise sensory exam appears intact. DTR's 1+. No abnl resting tone.    Stable 5/23   Assessment/Plan: 1. Functional deficits which require 3+ hours per day of interdisciplinary therapy in a comprehensive inpatient rehab setting. Physiatrist is providing close team supervision and 24 hour management of active medical problems listed below. Physiatrist and rehab team  continue to assess barriers to discharge/monitor patient progress toward functional and medical goals  Care Tool:  Bathing    Body parts bathed by patient: Right arm, Left arm, Chest, Abdomen, Front perineal area, Right upper leg, Left upper leg, Face   Body parts bathed by helper: Buttocks, Right lower leg Body parts n/a: Left lower leg   Bathing assist Assist Level: Minimal Assistance - Patient > 75%     Upper Body Dressing/Undressing Upper body dressing   What is the patient wearing?: Pull over shirt    Upper body assist Assist Level: Set up assist    Lower Body Dressing/Undressing Lower body  dressing      What is the patient wearing?: Incontinence brief, Pants     Lower body assist Assist for lower body dressing: Minimal Assistance - Patient > 75%     Toileting Toileting Toileting Activity did not occur (Clothing management and hygiene only): N/A (no void or bm)  Toileting assist Assist for toileting: Maximal Assistance - Patient 25 - 49%     Transfers Chair/bed transfer  Transfers assist  Chair/bed transfer activity did not occur: Safety/medical concerns  Chair/bed transfer assist level: Supervision/Verbal cueing Chair/bed transfer assistive device: Arboriculturist assist      Assist level: Supervision/Verbal cueing Assistive device: Walker-rolling Max distance: 150+   Walk 10 feet activity   Assist     Assist level: Supervision/Verbal cueing Assistive device: Walker-rolling   Walk 50 feet activity   Assist    Assist level: Supervision/Verbal cueing Assistive device: Walker-rolling    Walk 150 feet activity   Assist Walk 150 feet activity did not occur: Safety/medical concerns (fatigue, discomofort in LLE)  Assist level: Supervision/Verbal cueing Assistive device: Walker-rolling    Walk 10 feet on uneven surface  activity   Assist     Assist level: Minimal Assistance - Patient > 75% Assistive device: Development worker, international aid     Assist Is the patient using a wheelchair?: Yes Type of Wheelchair: Manual    Wheelchair assist level: Supervision/Verbal cueing Max wheelchair distance: 165    Wheelchair 50 feet with 2 turns activity    Assist        Assist Level: Supervision/Verbal cueing   Wheelchair 150 feet activity     Assist      Assist Level: Supervision/Verbal cueing   Blood pressure 100/68, pulse 65, temperature 98.2 F (36.8 C), temperature source Oral, resp. rate 17, height 6' (1.829 m), weight 93.7 kg, SpO2 99%.  Medical Problem List and Plan: 1. Functional  deficits secondary to sepsis secondary to cellulitis.  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  Plan follow-up Dopplers 4 to 6 weeks.             -patient may  shower if left leg is covered             -ELOS/Goals: 7-10 days, supervision to mod I with PT and sup/min with OT  -Continue CIR  Discussed plan for team meeting today  2.  Antithrombotics: -DVT/anticoagulation:  Mechanical: Antiembolism stockings, thigh (TED hose) Bilateral lower extremities             -antiplatelet therapy: Plavix  75 mg daily 3. Pain Management: Oxycodone  as needed             -pain seems fairly controlled 4. Mood/Behavior/Sleep: Provide emotional support             -antipsychotic  agents: N/A 5. Neuropsych/cognition: This patient is capable of making decisions on his own behalf. 6. Wounds on both legs appear to be healing nicely.              -continue dry dressings with kerlix to left calf and foot as well as right achilles area, asked nursing to change dressing today  -5/14 seen by [facility]- for leg wounds  -5/18 images updated for the chart  7. Fluids/Electrolytes/Nutrition: Routine in and outs with follow-up chemistries  - 5-19: Labs back with reduced NA 132 and increased K 5.3.  Fairly elevated and asymptomatic, will repeat in a.m. and consider Lokelma.  Did encourage p.o. fluids with patient today.  8.  ID/cellulitis..  Completing course of Augmentin /doxycycline  9.  AKI.  Resolved.  Follow-up chemistries  - 5/14 BUN/creatinine stable  5-19: BUN/creatinine stable  10.  Diabetes mellitus.  Hemoglobin A1c 6.4. d/c ISS  - Blood sugars well-controlled on BMPs   11.  Hypertension.  D/c norvasc                  08/06/2023    9:20 AM 08/06/2023    3:18 AM 08/05/2023    8:49 PM  Vitals with BMI  Systolic 100 123 540  Diastolic 68 85 79  Pulse 65 74 72    12.  History of prostate cancer.  Continue Proscar  5 mg daily  13.  Atrial  fibrillation.  Cardiac rate controlled.  Continue Librexia trial drug per cardiology services as well as Toprol -XL 12.5 mg every 12 hours  14.  Hyperlipidemia: continue Crestor /Zetia   15.  Elevated LFTs: d/c tylenol   16. Tachypnea: resolved, discussed that CXR is stable  17. Left groin pain: discussed that hip XR shows no acute abnormalities, recommended applying kpad  18. Hyponatremia: discussed that this has improved  19. Leukocytosis: WBC reviewed and this has resolved  20. Decreased left knee flexion: XR ordered and discussed that this shows degeneration  LOS: 10 days A FACE TO FACE EVALUATION WAS PERFORMED  Lavell Portugal P Setsuko Robins 08/06/2023, 11:27 AM

## 2023-08-06 NOTE — Progress Notes (Signed)
 Patient ID: Logan French, male   DOB: Jan 10, 1935, 89 y.o.   MRN: [id number]  Attempted to reach Graham-son via telephone but left a message he left message for worker to let know planning to have someone with 8-12 hours at home upon discharge, along with Mom to make sure his needs are meet. Asked MD to call and update him with medical issues. She responded she would. Aware discharge date 5/27. Will continue to work toward this.

## 2023-08-06 NOTE — Progress Notes (Signed)
 Physical Therapy Session Note  Patient Details  Name: Logan French MRN: [id number] Date of Birth: [birth year]  Today's Date: 08/06/2023 PT Individual Time: 1001-1158 PT Individual Time Calculation (min): 117 min   Short Term Goals: Week 1:  PT Short Term Goal 1 (Week 1): STG=LTG due to LOS Week 2:  PT Short Term Goal 1 (Week 2): STG=LTG due to LOS  Skilled Therapeutic Interventions/Progress Updates:   Treatment session with focus on outing/home evaluation at pt's home with recreational therapist, patient, and patient's wife Haskell Linker. Emphasis on outing: assessing safety with home layout, fall and injury prevention, maximizing independence with mobility, determining if Haskell Linker is able to provide assist pt requires, pt's ability to navigate steps in/out of home as well as inside home, and pt's ability to get in/out of shower.   Pt reported pain 0/10 in LLE with significant improvements from yesterday. Pt transported outside to rehab van in [facility] dependently and loaded onto [place] and transported to pt's home seated and secured in WC. Pt entered home via navigating 1 4in curb with RW and close supervision and navigated 3 6in steps with bilateral handrails and CGA/close supervision + 1 3in threshold over doorframe with close supervision - educated [place] on assisting pt bringing RW up/down steps. Pt ambulated throughout home using RW and close supervision (1 instance getting foot caught on rug and encouraged pt/Nancy pick up all throw rugs throughout home).  Pt's living room sits lower than rest of house (2 steps with R handrail) and pt able to navigate 2 steps going down into living room and 2 steps coming out of living room with R handrail and CGA - again emphasized that Haskell Linker will need to bring RW up/down steps - pt/Nancy report they plan to purchase another RW to keep in living room. Haskell Linker expressed concern with ability to help pt with stair navigation; therefore encouraged having neighbor, [place], or sons  assist and have plan B for pt to avoid going into living room (has office at front of house where he likes to work) until he gets stronger.   Pt practiced furniture transfers on/off various pieces of furniture including recliner and rolling office chair (although emphasized recommendation to switch out for standard stable chair) with close supervision. Pt toileted in bathroom nearest his office (preferred bathroom) but with difficulty getting up and required mod A from therapist to boost into standing. Recommended getting toilet riser with push up rails to ease with standing (although pt reports he would've gotten up himself eventually).   Discussed navigating in/out of shower (pt has walk in shower with 4in step). Pt requested to attempt to side step into shower. Informed pt of fall risks associated with this method and recommended stepping in backwards and stepping out forwards using RW. Pt then performed x 2 trials using RW with close supervision stepping in/out of shower - also educated [place] on using this technique. Recommended placement/positioning of shower chair in shower for pt to sit (pt originally placed shower chair in position that placed him at high risk for tripping on it when getting into shower).   Discussed recommendation for HHPT/HHOT as well as nursing and potentially hiring home health aide as Haskell Linker very anxious regarding pt's mobility upon returning home. Pt navigated 1 3in threshold with RW, 3 6in steps with bilateral handrails, and 1 4in curb with RW to exit home with close supervision with Haskell Linker assisting with RW management. Pt rolled onto van, secured seated in [facility], and returned to hospital.  Transported back to room in [facility] dependently and concluded session with pt sitting in [facility] with all needs within reach. Pt and Haskell Linker very appreciative of home evaluation and both feel more prepared for discharge Tuesday.   Therapy Documentation Precautions:  Precautions Precautions: Fall, Other  (comment) Precaution/Restrictions Comments: BLE wounds Restrictions Weight Bearing Restrictions Per Provider Order: No  Therapy/Group: Individual Therapy Nicolas Barren Zaunegger Nena Bank PT, DPT 08/06/2023, 6:52 AM

## 2023-08-06 NOTE — Progress Notes (Signed)
 Occupational Therapy Session Note  Patient Details  Name: Logan French MRN: [id number] Date of Birth: Jan 05, 1935  Today's Date: 08/06/2023 OT Individual Time: 9629-5284 OT Individual Time Calculation (min): 30 min    Short Term Goals: Week 2:  OT Short Term Goal 1 (Week 2): STG = LTG due to ELOS  Skilled Therapeutic Interventions/Progress Updates:     Pt received sleeping deeply in bed presenting to be tired, however in good spirits receptive to skilled OT session reporting 0/10 pain- OT offering intermittent rest breaks, repositioning, and therapeutic support to optimize participation in therapy session. Pt very motivated and expressing extreme thankfulness that he was able to participate in outing with PT, [name], today and spent large portion of session recounting the events from today and celebrating he progress stating "I am on top of the mountain". Focused this session on UE strengthening to increase overall activity tolerance and participation in ADLs. Pt transitioned to EOB form flattened bed with min A to fully lift trunk. Sitting EOB, engaged Pt in completing the following exercises using red (moderate resistance) therband with OT providing visual model of each exercise:  -Diagonal pulls 2x10 reps -Lateral chest pulls 2x10 reps -Shoulder retraction 2x10 reps  -Bicep curls 2x10 reps -Tricep extension 2x10 reps Pt tolerated exercises without pain. Min verbal cues required to initiate taking a rest break. Pt requesting to sit up in wc at end of session. Stand pivot EOB > wc using RW CGA. Pt was handed off to nursing staff for toileting with call bell in reach and all immediate needs met.   Therapy Documentation Precautions:  Precautions Precautions: Fall, Other (comment) Precaution/Restrictions Comments: BLE wounds Restrictions Weight Bearing Restrictions Per Provider Order: No  Therapy/Group: Individual Therapy  Geoffery Kiel 08/06/2023, 7:45 AM

## 2023-08-06 NOTE — Plan of Care (Signed)
  Problem: Consults Goal: RH GENERAL PATIENT EDUCATION Description: See Patient Education module for education specifics. Outcome: Progressing   Problem: RH BOWEL ELIMINATION Goal: RH STG MANAGE BOWEL WITH ASSISTANCE Description: STG Manage Bowel with toileting Assistance. Outcome: Progressing Goal: RH STG MANAGE BOWEL W/MEDICATION W/ASSISTANCE Description: STG Manage Bowel with Medication with mod I Assistance. Outcome: Progressing   Problem: RH BLADDER ELIMINATION Goal: RH STG MANAGE BLADDER WITH ASSISTANCE Description: STG Manage Bladder With Assistance Outcome: Progressing Goal: RH STG MANAGE BLADDER WITH MEDICATION WITH ASSISTANCE Description: STG Manage Bladder With Medication With mod I Assistance. Outcome: Progressing   Problem: RH SKIN INTEGRITY Goal: RH STG SKIN FREE OF INFECTION/BREAKDOWN Description: Manage skin/wounds w min assist Outcome: Progressing Goal: RH STG MAINTAIN SKIN INTEGRITY WITH ASSISTANCE Description: STG Maintain Skin Integrity With min Assistance. Outcome: Progressing Goal: RH STG ABLE TO PERFORM INCISION/WOUND CARE W/ASSISTANCE Description: STG Able To Perform Incision/Wound Care With min Assistance. Outcome: Progressing   Problem: RH SAFETY Goal: RH STG ADHERE TO SAFETY PRECAUTIONS W/ASSISTANCE/DEVICE Description: STG Adhere to Safety Precautions With cues Assistance/Device. Outcome: Progressing   Problem: RH PAIN MANAGEMENT Goal: RH STG PAIN MANAGED AT OR BELOW PT'S PAIN GOAL Description: < 4 with prns Outcome: Progressing   Problem: RH KNOWLEDGE DEFICIT GENERAL Goal: RH STG INCREASE KNOWLEDGE OF SELF CARE AFTER HOSPITALIZATION Description: Patient and family will be able to manage care at discharge using educational resources independently Outcome: Progressing

## 2023-08-06 NOTE — Progress Notes (Signed)
 Occupational Therapy Session Note  Patient Details  Name: Logan French MRN: [id number] Date of Birth: [birth year]  Today's Date: 08/06/2023 OT Individual Time: 4098-1191 OT Individual Time Calculation (min): 40 min    Short Term Goals: Week 2:  OT Short Term Goal 1 (Week 2): STG = LTG due to ELOS  Skilled Therapeutic Interventions/Progress Updates:  Skilled OT intervention completed with focus on ambulatory endurance, AE education. Pt received seated in w/c, agreeable to session. No pain reported.  Pt verbalized that his LLE/hip felt much better. Able to complete CGA sit > stand using BUE on arm rests, but needed cues for anterior weight shifting to RW. Ambulated about 500 ft using RW with CGA > gym with intermittent cues for RW proximity. Denied pain with mobility.  Unable to locate leg lifter, therefore demo/education provided on use of gait belt for lifting LLE in/out of bed and while in bed to reposition on days when LLE is stiffer and L hip pain is present as pt has fluctuated tremendously in rehab. Seated EOM, pt was able to loop gait belt over L foot, and successfully lift LLE onto mat into long sitting to simulate functional bed mobility. Handout issued to pt for purchase of one on [employer] that is sturdier. Discussed other options pt can use on "bad days" including the bilateral hamstring stretch.  Pt completed CGA sit > stand using RW and then ambulated 500 ft with close supervision using RW back to room with cues for upright gaze. Pt remained seated in w/c and with all needs in reach at end of session.   Therapy Documentation Precautions:  Precautions Precautions: Fall, Other (comment) Precaution/Restrictions Comments: BLE wounds Restrictions Weight Bearing Restrictions Per Provider Order: No    Therapy/Group: Individual Therapy  Ruthanna Covert, MS, OTR/L  08/06/2023, 8:51 AM

## 2023-08-07 LAB — CBC WITH DIFFERENTIAL/PLATELET
Abs Immature Granulocytes: 0.06 10*3/uL (ref 0.00–0.07)
Basophils Absolute: 0.1 10*3/uL (ref 0.0–0.1)
Basophils Relative: 1 %
Eosinophils Absolute: 0.2 10*3/uL (ref 0.0–0.5)
Eosinophils Relative: 2 %
HCT: 35.9 % — ABNORMAL LOW (ref 39.0–52.0)
Hemoglobin: 11.7 g/dL — ABNORMAL LOW (ref 13.0–17.0)
Immature Granulocytes: 1 %
Lymphocytes Relative: 22 %
Lymphs Abs: 1.5 10*3/uL (ref 0.7–4.0)
MCH: 28.1 pg (ref 26.0–34.0)
MCHC: 32.6 g/dL (ref 30.0–36.0)
MCV: 86.3 fL (ref 80.0–100.0)
Monocytes Absolute: 0.5 10*3/uL (ref 0.1–1.0)
Monocytes Relative: 8 %
Neutro Abs: 4.5 10*3/uL (ref 1.7–7.7)
Neutrophils Relative %: 66 %
Platelets: 385 10*3/uL (ref 150–400)
RBC: 4.16 MIL/uL — ABNORMAL LOW (ref 4.22–5.81)
RDW: 16.9 % — ABNORMAL HIGH (ref 11.5–15.5)
WBC: 6.9 10*3/uL (ref 4.0–10.5)
nRBC: 0 % (ref 0.0–0.2)

## 2023-08-07 MED ORDER — DOXYCYCLINE HYCLATE 100 MG PO TABS
100.0000 mg | ORAL_TABLET | Freq: Two times a day (BID) | ORAL | Status: DC
  Administered 2023-08-07 – 2023-08-10 (×6): 100 mg via ORAL
  Filled 2023-08-07 (×6): qty 1

## 2023-08-07 MED ORDER — TRIAMCINOLONE 0.1 % CREAM:EUCERIN CREAM 1:1
TOPICAL_CREAM | Freq: Two times a day (BID) | CUTANEOUS | Status: DC
  Filled 2023-08-07: qty 1

## 2023-08-07 NOTE — Plan of Care (Signed)
  Problem: Consults Goal: RH GENERAL PATIENT EDUCATION Description: See Patient Education module for education specifics. Outcome: Progressing   Problem: RH BOWEL ELIMINATION Goal: RH STG MANAGE BOWEL WITH ASSISTANCE Description: STG Manage Bowel with toileting Assistance. Outcome: Progressing Goal: RH STG MANAGE BOWEL W/MEDICATION W/ASSISTANCE Description: STG Manage Bowel with Medication with mod I Assistance. Outcome: Progressing   Problem: RH BLADDER ELIMINATION Goal: RH STG MANAGE BLADDER WITH ASSISTANCE Description: STG Manage Bladder With Assistance Outcome: Progressing Goal: RH STG MANAGE BLADDER WITH MEDICATION WITH ASSISTANCE Description: STG Manage Bladder With Medication With mod I Assistance. Outcome: Progressing   Problem: RH SKIN INTEGRITY Goal: RH STG SKIN FREE OF INFECTION/BREAKDOWN Description: Manage skin/wounds w min assist Outcome: Progressing Goal: RH STG MAINTAIN SKIN INTEGRITY WITH ASSISTANCE Description: STG Maintain Skin Integrity With min Assistance. Outcome: Progressing Goal: RH STG ABLE TO PERFORM INCISION/WOUND CARE W/ASSISTANCE Description: STG Able To Perform Incision/Wound Care With min Assistance. Outcome: Progressing

## 2023-08-07 NOTE — Progress Notes (Signed)
 Physical Therapy Session Note  Patient Details  Name: Milad Bublitz MRN: [id number] Date of Birth: September 14, 1934  Today's Date: 08/07/2023 PT Individual Time: 1335-1447 PT Individual Time Calculation (min): 72 min   Short Term Goals: Week 1:  PT Short Term Goal 1 (Week 1): STG=LTG due to LOS Week 2:  PT Short Term Goal 1 (Week 2): STG=LTG due to LOS  Skilled Therapeutic Interventions/Progress Updates:   Received pt sitting on toilet, pt agreeable to PT treatment, and denied any pain during session. Session with emphasis on toileting, functional mobility/transfers, generalized strengthening and endurance, dynamic standing balance/coordination, ambulation, simulated car transfers, and stair navigation. Pt with small BM, stood from commode with RW and CGA, and dependent for posterior pericare. Ambulated to [facility] with RW and CGA and donned pants seated in WC with max A for time management purposes, and required mod A to pull over hips.   Pt performed remainder of transfers with RW and CGA/close supervision throughout session. Pt ambulated 15ft with RW and supervision to ortho gym and performed ambulatory simulated car transfer with RW and supervision then ambulated 88ft on uneven surfaces (ramp) with RW and supervision. Transported to [employer] and ambulated 456ft with RW and supervision - emphasis on taking longer and equal strides while maintaining upright posture/gaze.   Transported to main therapy gym and navigated 12 6in steps with close supervision - first 4 steps with 2 handrails and last 8 steps with R handrail using lateral stepping technique. Pt with 1 LOB but able to self-correct. Stood at staircase and performed lateral step ups onto 6in step with 1UE support x10 bilaterally with CGA - emphasis on hip abductor strengthening. Transitioned to standing alternating toe taps 2x10 bilaterally with 1UE support and CGA for balance.  Ambulated 71ft with RW and supervision to plinth and performed  sit<>stands 2x10 from elevated EOM pushing up with BUE support and close supervision. Challenged pt with standing without UE support; pt required multiple attempts and relying on momentum to push into standing - completed x5 with CGA for balance. Pt then performed standing mini squats 3x10 without UE support and CGA with emphasis on quad strength. Pt then ambulated 133ft with RW and supervision back to room. Removed shoes with min A and transferred into supine with supervision. Concluded session with pt semi-reclined in bed, needs within reach, and bed alarm on.   Therapy Documentation Precautions:  Precautions Precautions: Fall, Other (comment) Precaution/Restrictions Comments: BLE wounds Restrictions Weight Bearing Restrictions Per Provider Order: No  Therapy/Group: Individual Therapy Nicolas Barren Zaunegger Nena Bank PT, DPT 08/07/2023, 6:56 AM

## 2023-08-07 NOTE — Progress Notes (Signed)
 PROGRESS NOTE   Subjective/Complaints: Knee flexion improving Home visit was spectacular yesterday Wife and PT were much relieved Has therapy this afternoon   ROS: Patient denies fever, chills, shortness of breath, chest pain, abdominal pain.  +RLE swelling, improved +Left lower extremity swelling and big toe ulceration +malaise +left groin pain- improved +improved knee flexion    Objective:   DG Knee 1-2 Views Left Result Date: 08/05/2023 CLINICAL DATA:  Left knee pain with history of cellulitis EXAM: LEFT KNEE - 1-2 VIEW COMPARISON:  None Available. FINDINGS: No evidence of fracture, dislocation, or joint effusion. There is mild medial compartment joint space narrowing and osteophyte formation. There is medial and lateral compartment chondrocalcinosis. Peripheral vascular calcifications are present. Soft tissues are otherwise within normal limits. IMPRESSION: 1. No acute fracture or dislocation. 2. Mild medial compartment osteoarthritis. 3. Chondrocalcinosis. Electronically Signed   By: Tyron Gallon M.D.   On: 08/05/2023 16:20   Recent Labs    08/06/23 0643 08/07/23 0725  WBC 7.7 6.9  HGB 10.9* 11.7*  HCT 33.7* 35.9*  PLT 368 385    Recent Labs    08/05/23 1046 08/06/23 0643  NA 130* 134*  K 4.3 4.1  CL 96* 102  CO2 20* 26  GLUCOSE 179* 101*  BUN 14 12  CREATININE 1.20 0.83  CALCIUM  8.7* 8.5*     Intake/Output Summary (Last 24 hours) at 08/07/2023 1039 Last data filed at 08/07/2023 0442 Gross per 24 hour  Intake 480 ml  Output 800 ml  Net -320 ml        Physical Exam: Vital Signs Blood pressure 129/74, pulse 73, temperature 98.4 F (36.9 C), temperature source Oral, resp. rate 18, height 6' (1.829 m), weight 93.7 kg, SpO2 97%.   General: Lying in bed, appears unwell HEENT: Head is normocephalic, atraumatic, PERRLA, EOMI, sclera anicteric, oral mucosa pink and moist Neck: Supple without JVD or  lymphadenopathy Heart: Reg rate and rhythm. Chest: CTA bilaterally without wheezes, rales, or rhonchi; no distress Abdomen: Soft, non-tender, non-distended, bowel sounds positive. Psych: Pt's affect is appropriate. Pt is cooperative Skin: Right Achilles gauze dressing with wound healing, no apparent erythema, borders starting to close. Left anterior shin and foot covered in dry gauze, remain open with some mild erythema and induration but improving prior image         MSK:    Left lower extremity mildly tender to palpation on anterior shin and ankle.  Otherwise, full active range of motion in all 4 extremities.  Neuro:     Comments: Alert and oriented x 3. Normal insight and awareness. Intact Memory. Normal language and speech. Cranial nerve exam unremarkable. MMT: BUE grossly 4/5 prox to distal. BLE 3+HF and KE and 4- ADF/PF. Mild sensory loss distally in both legs but otherwise sensory exam appears intact. DTR's 1+. No abnl resting tone.    Stable 5/24   Assessment/Plan: 1. Functional deficits which require 3+ hours per day of interdisciplinary therapy in a comprehensive inpatient rehab setting. Physiatrist is providing close team supervision and 24 hour management of active medical problems listed below. Physiatrist and rehab team continue to assess barriers to discharge/monitor patient progress toward functional and  medical goals  Care Tool:  Bathing    Body parts bathed by patient: Right arm, Left arm, Chest, Abdomen, Front perineal area, Right upper leg, Left upper leg, Face   Body parts bathed by helper: Buttocks, Right lower leg Body parts n/a: Left lower leg   Bathing assist Assist Level: Minimal Assistance - Patient > 75%     Upper Body Dressing/Undressing Upper body dressing   What is the patient wearing?: Pull over shirt    Upper body assist Assist Level: Set up assist    Lower Body Dressing/Undressing Lower body dressing      What is the patient wearing?:  Incontinence brief, Pants     Lower body assist Assist for lower body dressing: Minimal Assistance - Patient > 75%     Toileting Toileting Toileting Activity did not occur (Clothing management and hygiene only): N/A (no void or bm)  Toileting assist Assist for toileting: Maximal Assistance - Patient 25 - 49%     Transfers Chair/bed transfer  Transfers assist  Chair/bed transfer activity did not occur: Safety/medical concerns  Chair/bed transfer assist level: Contact Guard/Touching assist Chair/bed transfer assistive device: Geologist, engineering   Ambulation assist      Assist level: Supervision/Verbal cueing Assistive device: Walker-rolling Max distance: 150+   Walk 10 feet activity   Assist     Assist level: Supervision/Verbal cueing Assistive device: Walker-rolling   Walk 50 feet activity   Assist    Assist level: Supervision/Verbal cueing Assistive device: Walker-rolling    Walk 150 feet activity   Assist Walk 150 feet activity did not occur: Safety/medical concerns (fatigue, discomofort in LLE)  Assist level: Supervision/Verbal cueing Assistive device: Walker-rolling    Walk 10 feet on uneven surface  activity   Assist     Assist level: Minimal Assistance - Patient > 75% Assistive device: Development worker, international aid     Assist Is the patient using a wheelchair?: Yes Type of Wheelchair: Manual    Wheelchair assist level: Supervision/Verbal cueing Max wheelchair distance: 165    Wheelchair 50 feet with 2 turns activity    Assist        Assist Level: Supervision/Verbal cueing   Wheelchair 150 feet activity     Assist      Assist Level: Supervision/Verbal cueing   Blood pressure 129/74, pulse 73, temperature 98.4 F (36.9 C), temperature source Oral, resp. rate 18, height 6' (1.829 m), weight 93.7 kg, SpO2 97%.  Medical Problem List and Plan: 1. Functional deficits secondary to sepsis secondary to  cellulitis.  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  Plan follow-up Dopplers 4 to 6 weeks.             -patient may  shower if left leg is covered             -ELOS/Goals: 7-10 days, supervision to mod I with PT and sup/min with OT  -Continue CIR  Discussed plan for team meeting today  2.  Antithrombotics: -DVT/anticoagulation:  Mechanical: Antiembolism stockings, thigh (TED hose) Bilateral lower extremities             -antiplatelet therapy: continue Plavix  75 mg daily  3. Pain Management: continue Oxycodone  as needed             -pain seems fairly controlled 4. Mood/Behavior/Sleep: Provide emotional support             -antipsychotic agents: N/A 5. Neuropsych/cognition: This patient is  capable of making decisions on his own behalf.  6. Wounds on both legs appear to be healing nicely.              -continue dry dressings with kerlix to left calf and foot as well as right achilles area, asked nursing to change dressing today  -5/14 seen by [facility]- for leg wounds  -5/18 images updated for the chart  7. Fluids/Electrolytes/Nutrition: Routine in and outs with follow-up chemistries  - 5-19: Labs back with reduced NA 132 and increased K 5.3.  Fairly elevated and asymptomatic, will repeat in a.m. and consider Lokelma.  Did encourage p.o. fluids with patient today.  8.  ID/cellulitis..  Completing course of Augmentin /doxycycline  9.  AKI.  Resolved.  Follow-up chemistries  - 5/14 BUN/creatinine stable  5-19: BUN/creatinine stable  10.  Diabetes mellitus.  Hemoglobin A1c 6.4. d/c ISS  - Blood sugars well-controlled on BMPs   11.  Hypertension.  D/c norvasc                  08/07/2023    2:56 AM 08/06/2023    7:26 PM 08/06/2023    1:28 PM  Vitals with BMI  Systolic 129 135 132  Diastolic 74 86 72  Pulse 73 76 62    12.  History of prostate cancer.  Continue Proscar  5 mg daily  13.  Atrial fibrillation.  Cardiac rate  controlled.  Continue Librexia trial drug per cardiology services as well as Toprol -XL 12.5 mg every 12 hours  14.  Hyperlipidemia: continue Crestor /Zetia   15.  Elevated LFTs: d/c tylenol   16. Tachypnea: resolved, discussed that CXR is stable  17. Left groin pain: discussed that hip XR shows no acute abnormalities, recommended applying kpad  18. Hyponatremia: discussed that this has improved  19. Leukocytosis: WBC reviewed and this has resolved  20. Decreased left knee flexion: XR ordered and discussed that this shows degeneration  LOS: 11 days A FACE TO FACE EVALUATION WAS PERFORMED  Keven Pel Natallie Ravenscroft 08/07/2023, 10:39 AM

## 2023-08-08 NOTE — Progress Notes (Signed)
 Occupational Therapy Session Note  Patient Details  Name: Logan French MRN: [id number] Date of Birth: 1934-09-09  Today's Date: 08/08/2023 OT Individual Time: 1478-2956 OT Individual Time Calculation (min): 62 min    Short Term Goals: Week 2:  OT Short Term Goal 1 (Week 2): STG = LTG due to ELOS  Skilled Therapeutic Interventions/Progress Updates:  Pt greeted at sink with NT present, pt agreeable to OT intervention.      Transfers/bed mobility/functional mobility: pt completed functional ambulation greater than a household distance with RW and supervision.   Therapeutic activity:  Pt completed functional ambulation obstacle course with a focus on ambulating through tight areas and RW mgmt, graded task up by having pt collect horseshoes during course to challenge functional reach during ADLs. Pt completed task with RW and supervision.   Pt completed dynamic balance task with pt instructed to step up on 2 inch step with BUE support from RW then reach dynamically for horseshoes to be placed across midline on basketball goal. Pt completed task with BUE support from Rw with supervision. Pt completed x5 reps. Pt able to stand up on step with no UE support to remove horseshoes with supervision.    ADLs:  Grooming: pt able to stand at sink for grooming tasks with supervision.    Exercises: pt completed below BLE therex to facilitate improved LB strength for ADL participation.  X5 sit>stands from elevated EOM with no UE support with pt holding horseshoe at chest and then placing horseshoe on target once in standing, pt using a great effort of momentum to rise in standing.  1 min of seated  LAQS with level 2 resistance band around each BLEs X20 standing hip abd/ add X20 standing glute presses   Pt requested to work on [employer] for BLE strengthening on global endurance. Pt completed 12 mins at level 5 ; 605 steps.    Ended session with pt seated in w/c with all needs within reach.    Therapy Documentation Precautions:  Precautions Precautions: Fall, Other (comment) Precaution/Restrictions Comments: BLE wounds Restrictions Weight Bearing Restrictions Per Provider Order: No  Pain: 0/10 pain     Therapy/Group: Individual Therapy  Mollie Anger [facility] 08/08/2023, 12:01 PM

## 2023-08-08 NOTE — Progress Notes (Signed)
 PROGRESS NOTE   Subjective/Complaints: Discussed increased redness, weeping, tenderness to LLE, restarting doxycycline , he is ok with this, discussed it will not affect his d/c plan   ROS: Patient denies fever, chills, shortness of breath, chest pain, abdominal pain.  +RLE swelling, improved +Left lower extremity swelling and big toe ulceration +malaise +left groin pain- improved +improved knee flexion- some tenderness in leg    Objective:   No results found.  Recent Labs    08/06/23 0643 08/07/23 0725  WBC 7.7 6.9  HGB 10.9* 11.7*  HCT 33.7* 35.9*  PLT 368 385    Recent Labs    08/05/23 1046 08/06/23 0643  NA 130* 134*  K 4.3 4.1  CL 96* 102  CO2 20* 26  GLUCOSE 179* 101*  BUN 14 12  CREATININE 1.20 0.83  CALCIUM  8.7* 8.5*     Intake/Output Summary (Last 24 hours) at 08/08/2023 0956 Last data filed at 08/08/2023 0617 Gross per 24 hour  Intake 358 ml  Output 1075 ml  Net -717 ml        Physical Exam: Vital Signs Blood pressure 137/89, pulse 75, temperature 97.8 F (36.6 C), temperature source Oral, resp. rate 18, height 6' (1.829 m), weight 93.7 kg, SpO2 99%.   General: Lying in bed, appears unwell HEENT: Head is normocephalic, atraumatic, PERRLA, EOMI, sclera anicteric, oral mucosa pink and moist Neck: Supple without JVD or lymphadenopathy Heart: Reg rate and rhythm. Chest: CTA bilaterally without wheezes, rales, or rhonchi; no distress Abdomen: Soft, non-tender, non-distended, bowel sounds positive. Psych: Pt's affect is appropriate. Pt is cooperative Skin: Right Achilles gauze dressing with wound healing, no apparent erythema, borders starting to close. Left anterior shin and foot covered in dry gauze, remain open with some mild erythema and induration but improving prior image         MSK:    Left lower extremity mildly tender to palpation on anterior shin and ankle.  Otherwise,  full active range of motion in all 4 extremities.  Neuro:     Comments: Alert and oriented x 3. Normal insight and awareness. Intact Memory. Normal language and speech. Cranial nerve exam unremarkable. MMT: BUE grossly 4/5 prox to distal. BLE 3+HF and KE and 4- ADF/PF. Mild sensory loss distally in both legs but otherwise sensory exam appears intact. DTR's 1+. No abnl resting tone.    Stable 5/25   Assessment/Plan: 1. Functional deficits which require 3+ hours per day of interdisciplinary therapy in a comprehensive inpatient rehab setting. Physiatrist is providing close team supervision and 24 hour management of active medical problems listed below. Physiatrist and rehab team continue to assess barriers to discharge/monitor patient progress toward functional and medical goals  Care Tool:  Bathing    Body parts bathed by patient: Right arm, Left arm, Chest, Abdomen, Front perineal area, Right upper leg, Left upper leg, Face   Body parts bathed by helper: Buttocks, Right lower leg Body parts n/a: Left lower leg   Bathing assist Assist Level: Minimal Assistance - Patient > 75%     Upper Body Dressing/Undressing Upper body dressing   What is the patient wearing?: Pull over shirt    Upper body  assist Assist Level: Set up assist    Lower Body Dressing/Undressing Lower body dressing      What is the patient wearing?: Incontinence brief, Pants     Lower body assist Assist for lower body dressing: Minimal Assistance - Patient > 75%     Toileting Toileting Toileting Activity did not occur (Clothing management and hygiene only): N/A (no void or bm)  Toileting assist Assist for toileting: Maximal Assistance - Patient 25 - 49%     Transfers Chair/bed transfer  Transfers assist  Chair/bed transfer activity did not occur: Safety/medical concerns  Chair/bed transfer assist level: Supervision/Verbal cueing Chair/bed transfer assistive device: Surveyor, quantity assist      Assist level: Supervision/Verbal cueing Assistive device: Walker-rolling Max distance: 479ft   Walk 10 feet activity   Assist     Assist level: Supervision/Verbal cueing Assistive device: Walker-rolling   Walk 50 feet activity   Assist    Assist level: Supervision/Verbal cueing Assistive device: Walker-rolling    Walk 150 feet activity   Assist Walk 150 feet activity did not occur: Safety/medical concerns (fatigue, discomofort in LLE)  Assist level: Supervision/Verbal cueing Assistive device: Walker-rolling    Walk 10 feet on uneven surface  activity   Assist     Assist level: Supervision/Verbal cueing Assistive device: Walker-rolling   Wheelchair     Assist Is the patient using a wheelchair?: Yes Type of Wheelchair: Manual    Wheelchair assist level: Supervision/Verbal cueing Max wheelchair distance: 165    Wheelchair 50 feet with 2 turns activity    Assist        Assist Level: Supervision/Verbal cueing   Wheelchair 150 feet activity     Assist      Assist Level: Supervision/Verbal cueing   Blood pressure 137/89, pulse 75, temperature 97.8 F (36.6 C), temperature source Oral, resp. rate 18, height 6' (1.829 m), weight 93.7 kg, SpO2 99%.  Medical Problem List and Plan: 1. Functional deficits secondary to sepsis secondary to cellulitis.  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  Plan follow-up Dopplers 4 to 6 weeks.             -patient may  shower if left leg is covered             -ELOS/Goals: 7-10 days, supervision to mod I with PT and sup/min with OT  -Continue CIR  Discussed plan for team meeting today  2.  Antithrombotics: -DVT/anticoagulation:  Mechanical: Antiembolism stockings, thigh (TED hose) Bilateral lower extremities             -antiplatelet therapy: continue Plavix  75 mg daily  3. Pain  Management: continue Oxycodone  as needed             -pain seems fairly controlled 4. Mood/Behavior/Sleep: Provide emotional support             -antipsychotic agents: N/A 5. Neuropsych/cognition: This patient is capable of making decisions on his own behalf.  6. Wounds on both legs appear to be healing nicely.              -continue dry dressings with kerlix to left calf and foot as well as right achilles area, asked nursing to change dressing today  -5/14 seen by [facility]- for leg wounds  -5/18 images updated for the chart  7. Fluids/Electrolytes/Nutrition: Routine in and outs with follow-up chemistries  - 5-19: Labs back with reduced NA 132 and increased  K 5.3.  Fairly elevated and asymptomatic, will repeat in a.m. and consider Lokelma.  Did encourage p.o. fluids with patient today.  8.  ID/cellulitis.[name]  restarted doxycyline given increased redness, tenderness, weeping in LLE.  9.  AKI.  Resolved.  Follow-up chemistries  - 5/14 BUN/creatinine stable  5-19: BUN/creatinine stable  10.  Diabetes mellitus.  Hemoglobin A1c 6.4. d/c ISS  - Blood sugars well-controlled on BMPs   11.  Hypertension.  D/c norvasc                  08/08/2023    4:28 AM 08/07/2023    6:50 PM 08/07/2023    3:36 PM  Vitals with BMI  Systolic 137 134 478  Diastolic 89 77 78  Pulse 75 70 68    12.  History of prostate cancer.  Decreased 2.5mg  daily  13.  Atrial fibrillation.  Cardiac rate controlled.  Continue Librexia trial drug per cardiology services as well as Toprol -XL 12.5 mg every 12 hours  14.  Hyperlipidemia:continue Crestor /Zetia   15.  Elevated LFTs: d/c tylenol   16. Tachypnea: resolved, discussed that CXR is stable  17. Left groin pain: discussed that hip XR shows no acute abnormalities, recommended applying kpad  18. Hyponatremia: discussed that this has improved  19. Leukocytosis: WBC reviewed and this has resolved  20. Decreased left knee flexion: XR ordered and discussed that this shows  degeneration  LOS: 12 days A FACE TO FACE EVALUATION WAS PERFORMED  Keven Pel Harlee Eckroth 08/08/2023, 9:56 AM

## 2023-08-08 NOTE — Progress Notes (Signed)
   08/08/23 1400  Spiritual Encounters  Type of Visit Initial  Care provided to: Pt and family  Conversation partners present during encounter Other (comment) (PT)  Referral source Patient request  Reason for visit Routine spiritual support  Spiritual Framework  Presenting Themes Meaning/purpose/sources of inspiration;Courage hope and growth   Chaplain from community visited patient at family's request.  Provided spiritual care and comfort and support.   Pt did not exhibit any spiritual distress at all during this vist.  Family was present.    Prayed for pt and family.  Ended visit with a departing blessing.    Rev. Dominick Fries

## 2023-08-08 NOTE — Progress Notes (Signed)
 Occupational Therapy Session Note  Patient Details  Name: Logan French MRN: [id number] Date of Birth: 1934-07-07  {CHL IP REHAB OT TIME CALCULATIONS:[id number]}   Short Term Goals: Week 2:  OT Short Term Goal 1 (Week 2): STG = LTG due to ELOS  Skilled Therapeutic Interventions/Progress Updates:    Patient agreeable to participate in OT session. Reports *** pain level.   Patient participated in skilled OT session focusing on ***. Therapist facilitated/assessed/developed/educated/integrated/elicited *** in order to improve/facilitate/promote    Therapy Documentation Precautions:  Precautions Precautions: Fall, Other (comment) Precaution/Restrictions Comments: BLE wounds Restrictions Weight Bearing Restrictions Per Provider Order: No  Therapy/Group: Individual Therapy  Carollee Circle, OTR/L,CBIS  Supplemental OT - [facility] and [facility] Secure Chat Preferred   08/08/2023, 9:42 PM

## 2023-08-08 NOTE — Progress Notes (Signed)
 Physical Therapy Session Note  Patient Details  Name: Logan French MRN: [id number] Date of Birth: 10/28/1934  Today's Date: 08/08/2023 PT Individual Time: 1400-1447 PT Individual Time Calculation (min): 47 min   Short Term Goals: Week 1:  PT Short Term Goal 1 (Week 1): STG=LTG due to LOS Week 2:  PT Short Term Goal 1 (Week 2): STG=LTG due to LOS  Skilled Therapeutic Interventions/Progress Updates:   Pt received in wheelchair with sons present. Pt agreeable to PT tx and denies pain.   GT: Pt ambulated >400 ft x 2, x 124 ft x 2 with RW with PT light CGA to SBA and familial/PT verbal cues for posture, stride and remaining within RW base. NMR: Standing eyes eyes closed x 1 min without sway with PT SBA; standing eyes open bil shoulder flexion/ext x 15 with PT SBA; standing eyes closed with perturbations all directions without sway, standing eyes closed with PT SBA trunk rotation each direction on command with 1-3 sec hold back in middle for further balance challenge; 6 " lateral step taps with 1 rail x 20; 6" frontal step taps alternating bottom 2 steps to maintain SLS longer x 12 each side with 2 rest break requests requested.   Pt returned to bed with sons present; no complaints of pain. Bed alarm on.      Therapy Documentation Precautions:  Precautions Precautions: Fall, Other (comment) Precaution/Restrictions Comments: BLE wounds Restrictions Weight Bearing Restrictions Per Provider Order: No  Therapy/Group: Individual Therapy  Jeannene Milling 08/08/2023, 7:40 AM

## 2023-08-08 NOTE — Progress Notes (Signed)
 Changed pt's left leg dressing.

## 2023-08-09 ENCOUNTER — Inpatient Hospital Stay (HOSPITAL_COMMUNITY)

## 2023-08-09 MED ORDER — FINASTERIDE 5 MG PO TABS
5.0000 mg | ORAL_TABLET | Freq: Every day | ORAL | 0 refills | Status: AC
  Filled 2023-08-09: qty 30, 30d supply, fill #0

## 2023-08-09 MED ORDER — HYDROCHLOROTHIAZIDE 12.5 MG PO TABS
12.5000 mg | ORAL_TABLET | Freq: Every day | ORAL | 0 refills | Status: DC
  Filled 2023-08-09: qty 30, 30d supply, fill #0

## 2023-08-09 MED ORDER — TRIAMCINOLONE 0.1 % CREAM:EUCERIN CREAM 1:1
1.0000 | TOPICAL_CREAM | Freq: Two times a day (BID) | CUTANEOUS | 0 refills | Status: DC
  Filled 2023-08-09: qty 1, 1d supply, fill #0

## 2023-08-09 MED ORDER — VITAMIN D3 25 MCG PO TABS
1000.0000 [IU] | ORAL_TABLET | Freq: Every day | ORAL | 0 refills | Status: AC
  Filled 2023-08-09: qty 30, 30d supply, fill #0

## 2023-08-09 MED ORDER — METOPROLOL SUCCINATE ER 25 MG PO TB24
12.5000 mg | ORAL_TABLET | Freq: Two times a day (BID) | ORAL | 0 refills | Status: AC
Start: 2023-08-09 — End: ?
  Filled 2023-08-09: qty 30, 30d supply, fill #0

## 2023-08-09 MED ORDER — COLLAGENASE 250 UNIT/GM EX OINT
TOPICAL_OINTMENT | Freq: Every day | CUTANEOUS | 0 refills | Status: DC
Start: 2023-08-09 — End: 2023-10-19
  Filled 2023-08-09: qty 30, 14d supply, fill #0

## 2023-08-09 MED ORDER — PANTOPRAZOLE SODIUM 40 MG PO TBEC
40.0000 mg | DELAYED_RELEASE_TABLET | Freq: Every day | ORAL | 0 refills | Status: AC
Start: 2023-08-09 — End: ?
  Filled 2023-08-09: qty 30, 30d supply, fill #0

## 2023-08-09 MED ORDER — OXYCODONE HCL 5 MG PO TABS
5.0000 mg | ORAL_TABLET | Freq: Four times a day (QID) | ORAL | 0 refills | Status: AC | PRN
  Filled 2023-08-09: qty 30, 8d supply, fill #0

## 2023-08-09 MED ORDER — COLCHICINE 0.6 MG PO TABS
0.6000 mg | ORAL_TABLET | Freq: Every day | ORAL | 0 refills | Status: AC
  Filled 2023-08-09: qty 30, 30d supply, fill #0

## 2023-08-09 MED ORDER — DOXYCYCLINE HYCLATE 100 MG PO TABS
100.0000 mg | ORAL_TABLET | Freq: Two times a day (BID) | ORAL | 0 refills | Status: DC
  Filled 2023-08-09: qty 6, 3d supply, fill #0

## 2023-08-09 MED ORDER — EZETIMIBE 10 MG PO TABS
10.0000 mg | ORAL_TABLET | Freq: Every day | ORAL | 0 refills | Status: AC
  Filled 2023-08-09: qty 30, 30d supply, fill #0

## 2023-08-09 MED ORDER — VITAMIN B-12 1000 MCG PO TABS
500.0000 ug | ORAL_TABLET | Freq: Every day | ORAL | 0 refills | Status: AC
Start: 2023-08-09 — End: ?
  Filled 2023-08-09: qty 15, 30d supply, fill #0

## 2023-08-09 MED ORDER — METFORMIN HCL 500 MG PO TABS
500.0000 mg | ORAL_TABLET | Freq: Every day | ORAL | 0 refills | Status: AC
Start: 2023-08-09 — End: ?
  Filled 2023-08-09: qty 30, 30d supply, fill #0

## 2023-08-09 MED ORDER — ROSUVASTATIN CALCIUM 20 MG PO TABS
20.0000 mg | ORAL_TABLET | Freq: Every day | ORAL | 0 refills | Status: DC
  Filled 2023-08-09: qty 30, 30d supply, fill #0

## 2023-08-09 MED ORDER — DICLOFENAC SODIUM 1 % EX GEL
2.0000 g | Freq: Four times a day (QID) | CUTANEOUS | 0 refills | Status: DC
  Filled 2023-08-09: qty 100, 13d supply, fill #0

## 2023-08-09 MED ORDER — CLOPIDOGREL BISULFATE 75 MG PO TABS
75.0000 mg | ORAL_TABLET | Freq: Every day | ORAL | 0 refills | Status: AC
Start: 2023-08-09 — End: ?
  Filled 2023-08-09: qty 30, 30d supply, fill #0

## 2023-08-09 NOTE — Plan of Care (Signed)
  Problem: RH Balance Goal: LTG Patient will maintain dynamic standing with ADLs (OT) Description: LTG:  Patient will maintain dynamic standing balance with assist during activities of daily living (OT)  Outcome: Completed/Met   Problem: Sit to Stand Goal: LTG:  Patient will perform sit to stand in prep for activites of daily living with assistance level (OT) Description: LTG:  Patient will perform sit to stand in prep for activites of daily living with assistance level (OT) Outcome: Completed/Met   Problem: RH Bathing Goal: LTG Patient will bathe all body parts with assist levels (OT) Description: LTG: Patient will bathe all body parts with assist levels (OT) Outcome: Completed/Met   Problem: RH Dressing Goal: LTG Patient will perform lower body dressing w/assist (OT) Description: LTG: Patient will perform lower body dressing with assist, with/without cues in positioning using equipment (OT) Outcome: Completed/Met   Problem: RH Toileting Goal: LTG Patient will perform toileting task (3/3 steps) with assistance level (OT) Description: LTG: Patient will perform toileting task (3/3 steps) with assistance level (OT)  Outcome: Completed/Met   Problem: RH Toilet Transfers Goal: LTG Patient will perform toilet transfers w/assist (OT) Description: LTG: Patient will perform toilet transfers with assist, with/without cues using equipment (OT) Outcome: Completed/Met   Problem: RH Tub/Shower Transfers Goal: LTG Patient will perform tub/shower transfers w/assist (OT) Description: LTG: Patient will perform tub/shower transfers with assist, with/without cues using equipment (OT) Outcome: Completed/Met   

## 2023-08-09 NOTE — Progress Notes (Signed)
 Occupational Therapy Discharge Summary  Patient Details  Name: Logan French MRN: [id number] Date of Birth: 1935-02-02  Date of Discharge from OT service:Aug 09, 2023  Patient has met 7 of 7 long term goals due to improved activity tolerance, improved balance, ability to compensate for deficits, improved awareness, and improved coordination.  Patient to discharge at overall Supervision to Mod I.  Patient's care partner is independent to provide the necessary physical assistance at discharge.    All goals met  Recommendation:  Patient will benefit from ongoing skilled OT services in home health setting to continue to advance functional skills in the area of BADL and Reduce care partner burden.  Equipment: No equipment provided  Reasons for discharge: treatment goals met  Patient/family agrees with progress made and goals achieved: Yes  OT Discharge Precautions/Restrictions  Precautions Precautions: Fall Precaution/Restrictions Comments: BLE wounds Restrictions Weight Bearing Restrictions Per Provider Order: No ADL ADL Eating: Independent Where Assessed-Eating: Wheelchair Grooming: Modified independent Where Assessed-Grooming: Sitting at sink Upper Body Bathing: Modified independent Where Assessed-Upper Body Bathing: Shower Lower Body Bathing: Supervision/safety Where Assessed-Lower Body Bathing: Shower Upper Body Dressing: Modified independent (Device) Where Assessed-Upper Body Dressing: Wheelchair Lower Body Dressing: Supervision/safety Where Assessed-Lower Body Dressing: Sitting at sink, Standing at sink Toileting: Modified independent Where Assessed-Toileting: Bedside Commode, Toilet Toilet Transfer: Modified independent Toilet Transfer Method: Proofreader: Gaffer: Not assessed Tub/Shower Transfer Method: Unable to assess Film/video editor: Close supervision Film/video editor Method:  Designer, industrial/product: Information systems manager with back Vision Baseline Vision/History: 1 Wears glasses Patient Visual Report: No change from baseline Vision Assessment?: No apparent visual deficits Perception  Perception: Within Functional Limits Praxis Praxis: WFL Cognition Cognition Overall Cognitive Status: Within Functional Limits for tasks assessed Arousal/Alertness: Awake/alert Orientation Level: Person;Place;Situation Person: Oriented Place: Oriented Situation: Oriented Memory: Appears intact Awareness: Appears intact Problem Solving: Appears intact Safety/Judgment: Appears intact Brief Interview for Mental Status (BIMS) Repetition of Three Words (First Attempt): 3 Temporal Orientation: Year: Correct Temporal Orientation: Month: Accurate within 5 days Temporal Orientation: Day: Correct Recall: "Sock": Yes, no cue required Recall: "Blue": Yes, no cue required Recall: "Bed": Yes, no cue required BIMS Summary Score: 15 Sensation Sensation Light Touch: Appears Intact Hot/Cold: Not tested Proprioception: Appears Intact Stereognosis: Not tested Coordination Gross Motor Movements are Fluid and Coordinated: Yes Fine Motor Movements are Fluid and Coordinated: Yes Coordination and Movement Description: global weakness/deconditioning, fatigue, discomfort in LLE Finger Nose Finger Test: [facility] bilaterally Heel Shin Test: [facility] bilaterally Motor  Motor Motor: Within Functional Limits Motor - Skilled Clinical Observations: global weakness/deconditioning, fatigue, discomfort in LLE - improved since eval Mobility  Bed Mobility Bed Mobility: Rolling Right;Rolling Left;Sit to Supine;Supine to Sit Rolling Right: Independent with assistive device Rolling Left: Independent with assistive device Supine to Sit: Independent with assistive device Sit to Supine: Independent with assistive device Transfers Sit to Stand: Independent with assistive device Stand to Sit: Independent  with assistive device  Trunk/Postural Assessment  Cervical Assessment Cervical Assessment: Exceptions to [facility] (forward head) Thoracic Assessment Thoracic Assessment: Exceptions to [facility] (rounded shoulders) Lumbar Assessment Lumbar Assessment: Exceptions to [facility] (posterior pelvic tilt) Postural Control Postural Control: Within Functional Limits  Balance Balance Balance Assessed: Yes Static Sitting Balance Static Sitting - Balance Support: Feet supported;Bilateral upper extremity supported Static Sitting - Level of Assistance: 7: Independent Dynamic Sitting Balance Dynamic Sitting - Balance Support: Feet supported;No upper extremity supported Dynamic Sitting - Level of Assistance: 6: Modified independent (Device/Increase time) Static  Standing Balance Static Standing - Balance Support: During functional activity;Bilateral upper extremity supported (RW) Static Standing - Level of Assistance: 6: Modified independent (Device/Increase time) Dynamic Standing Balance Dynamic Standing - Balance Support: Bilateral upper extremity supported;During functional activity (RW) Dynamic Standing - Level of Assistance: 6: Modified independent (Device/Increase time) Extremity/Trunk Assessment RUE Assessment RUE Assessment: Within Functional Limits LUE Assessment LUE Assessment: Within Functional Limits   Alashia Brownfield E Judah Chevere, MS, OTR/L  08/09/2023, 3:41 PM

## 2023-08-09 NOTE — Progress Notes (Addendum)
 PROGRESS NOTE   Subjective/Complaints: Pt discussed dc planning at length. Had questions about wounds.  ROS: Patient denies fever, rash, sore throat, blurred vision, dizziness, nausea, vomiting, diarrhea, cough, shortness of breath or chest pain, joint or back/neck pain, headache, or mood change.     Objective:   No results found.  Recent Labs    08/07/23 0725  WBC 6.9  HGB 11.7*  HCT 35.9*  PLT 385    No results for input(s): "NA", "K", "CL", "CO2", "GLUCOSE", "BUN", "CREATININE", "CALCIUM " in the last 72 hours.    Intake/Output Summary (Last 24 hours) at 08/09/2023 0927 Last data filed at 08/09/2023 0800 Gross per 24 hour  Intake 598 ml  Output 1450 ml  Net -852 ml        Physical Exam: Vital Signs Blood pressure (!) 140/93, pulse 77, temperature 97.7 F (36.5 C), temperature source Oral, resp. rate 18, height 6' (1.829 m), weight 93.7 kg, SpO2 98%.   Constitutional: No distress . Vital signs reviewed. HEENT: NCAT, EOMI, oral membranes moist Neck: supple Cardiovascular: RRR without murmur. No JVD    Respiratory/Chest: CTA Bilaterally without wheezes or rales. Normal effort    GI/Abdomen: BS +, non-tender, non-distended Ext: no clubbing, cyanosis, or edema Psych: pleasant and cooperative  Skin: all wounds healing nicely, closing with minimal drainage        MSK:    Left lower extremity mildly tender to palpation on anterior shin and ankle.  Otherwise, full active range of motion in all 4 extremities.  Neuro:     Comments: Alert and oriented x 3. Normal insight and awareness. Intact Memory. Normal language and speech. Cranial nerve exam unremarkable. MMT: BUE grossly 4/5 prox to distal. BLE 3+HF and KE and 4- ADF/PF. Mild sensory loss distally in both legs but otherwise sensory exam appears intact. DTR's 1+. No abnl resting tone.  No change 5/26   Assessment/Plan: 1. Functional deficits which  require 3+ hours per day of interdisciplinary therapy in a comprehensive inpatient rehab setting. Physiatrist is providing close team supervision and 24 hour management of active medical problems listed below. Physiatrist and rehab team continue to assess barriers to discharge/monitor patient progress toward functional and medical goals  Care Tool:  Bathing    Body parts bathed by patient: Right arm, Left arm, Chest, Abdomen, Front perineal area, Right upper leg, Left upper leg, Face, Buttocks   Body parts bathed by helper: Buttocks, Right lower leg Body parts n/a: Left lower leg, Right lower leg (covered by dressings)   Bathing assist Assist Level: Supervision/Verbal cueing     Upper Body Dressing/Undressing Upper body dressing   What is the patient wearing?: Pull over shirt    Upper body assist Assist Level: Independent with assistive device    Lower Body Dressing/Undressing Lower body dressing      What is the patient wearing?: Incontinence brief, Pants     Lower body assist Assist for lower body dressing: Minimal Assistance - Patient > 75%     Toileting Toileting Toileting Activity did not occur (Clothing management and hygiene only): N/A (no void or bm)  Toileting assist Assist for toileting: Independent with assistive device  Transfers Chair/bed transfer  Transfers assist  Chair/bed transfer activity did not occur: Safety/medical concerns  Chair/bed transfer assist level: Independent with assistive device Chair/bed transfer assistive device: Geologist, engineering   Ambulation assist      Assist level: Independent with assistive device Assistive device: Walker-rolling Max distance: >151ft   Walk 10 feet activity   Assist     Assist level: Independent with assistive device Assistive device: Walker-rolling   Walk 50 feet activity   Assist    Assist level: Independent with assistive device Assistive device: Walker-rolling     Walk 150 feet activity   Assist Walk 150 feet activity did not occur: Safety/medical concerns (fatigue, discomofort in LLE)  Assist level: Independent with assistive device Assistive device: Walker-rolling    Walk 10 feet on uneven surface  activity   Assist     Assist level: Supervision/Verbal cueing Assistive device: Walker-rolling   Wheelchair     Assist Is the patient using a wheelchair?: No Type of Wheelchair: Manual Wheelchair activity did not occur: N/A  Wheelchair assist level: Supervision/Verbal cueing Max wheelchair distance: 165    Wheelchair 50 feet with 2 turns activity    Assist    Wheelchair 50 feet with 2 turns activity did not occur: N/A   Assist Level: Supervision/Verbal cueing   Wheelchair 150 feet activity     Assist  Wheelchair 150 feet activity did not occur: N/A   Assist Level: Supervision/Verbal cueing   Blood pressure (!) 140/93, pulse 77, temperature 97.7 F (36.5 C), temperature source Oral, resp. rate 18, height 6' (1.829 m), weight 93.7 kg, SpO2 98%.  Medical Problem List and Plan: 1. Functional deficits secondary to sepsis secondary to cellulitis.  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  Plan follow-up Dopplers 4 to 6 weeks.             -patient may  shower if left leg is covered             -ELOS/Goals: dc home 5/27   -he would like to neuro rehab [address] for PT. Has HHPT first it appears.    -discussed no submersion of wounds, pool until completely healed  2.  Antithrombotics: -DVT/anticoagulation:  Mechanical: Antiembolism stockings, thigh (TED hose) Bilateral lower extremities             -antiplatelet therapy: continue Plavix  75 mg daily  3. Pain Management: continue Oxycodone  as needed             -pain seems fairly controlled 4. Mood/Behavior/Sleep: Provide emotional support             -antipsychotic agents: N/A 5. Neuropsych/cognition:  This patient is capable of making decisions on his own behalf.  6. Wounds on both legs appear to be healing nicely.              -continue dry dressings with kerlix to left calf and foot as well as right achilles area, asked nursing to change dressing today  -5/14 seen by [facility]- for leg wounds  -5/18 images updated for the chart  5/27 see discussion above 7. Fluids/Electrolytes/Nutrition: Routine in and outs with follow-up chemistries  - 5-19: Labs back with reduced NA 132 and increased K 5.3.  Fairly elevated and asymptomatic, will repeat in a.m. and consider Lokelma.  Did encourage p.o. fluids with patient today.  8.  ID/cellulitis.[name]  restarted doxycyline given increased redness, tenderness, weeping in LLE.  9.  AKI.  Resolved.  Follow-up chemistries  - 5/14 BUN/creatinine stable  5-19: BUN/creatinine stable  10.  Diabetes mellitus.  Hemoglobin A1c 6.4. d/c ISS  - Blood sugars well-controlled on BMPs   11.  Hypertension.  D/c norvasc                  08/09/2023    4:48 AM 08/08/2023    7:48 PM 08/08/2023    3:07 PM  Vitals with BMI  Systolic 140 154 161  Diastolic 93 89 69  Pulse 77 74 63   -5/26 might benefit from resuming at least low dose norvasc   12.  History of prostate cancer.  Decreased 2.5mg  daily  13.  Atrial fibrillation.  Cardiac rate controlled.  Continue Librexia trial drug per cardiology services as well as Toprol -XL 12.5 mg every 12 hours  14.  Hyperlipidemia:continue Crestor /Zetia   15.  Elevated LFTs: d/c tylenol   16. Tachypnea: resolved, discussed that CXR is stable  17. Left groin pain: discussed that hip XR shows no acute abnormalities, recommended applying kpad  18. Hyponatremia: discussed that this has improved  19. Leukocytosis: WBC reviewed and this has resolved  20. Decreased left knee flexion: XR ordered and discussed that this shows degeneration  LOS: 13 days A FACE TO FACE EVALUATION WAS PERFORMED  Rawland Caddy 08/09/2023, 9:27 AM

## 2023-08-09 NOTE — Progress Notes (Signed)
 Patient ID: Logan French, male   DOB: 04-Aug-1934, 89 y.o.   MRN: [id number] Supplies for home dressing changes left in the room with the patient; bag labeled for home dressing supplies and reviewed with the patient what was delivered. Naoma Bacca

## 2023-08-09 NOTE — Progress Notes (Addendum)
 Inpatient Rehabilitation Discharge Medication Review by a Pharmacist  A complete drug regimen review was completed for this patient to identify any potential clinically significant medication issues.  High Risk Drug Classes Is patient taking? Indication by Medication  Antipsychotic No   Anticoagulant Yes Study-LIBREXIA-AF apixaban  or placebo and Study-LIBREXIA (Milvexian) or placebo: PAF, PAD  Antibiotic No   Opioid Yes Oxycodone - pain  Antiplatelet Yes Plavix  - PVD  Hypoglycemics/insulin  No   Vasoactive Medication Yes Metoprolol  succinate - HTN   Chemotherapy No   Other Yes Proscar - BPH  Protonix  - GERD Rosuvastatin , Zetia - HLD     Type of Medication Issue Identified Description of Issue Recommendation(s)  Drug Interaction(s) (clinically significant)     Duplicate Therapy     Allergy     No Medication Administration End Date     Incorrect Dose     Additional Drug Therapy Needed     Significant med changes from prior encounter (inform family/care partners about these prior to discharge). Metoprolol  dose reduced  Stopped: Amlodipine , furosemide  Counsel patient and/or family/care partners at discharge.   Other       Clinically significant medication issues were identified that warrant physician communication and completion of prescribed/recommended actions by midnight of the next day:  No  Name of provider notified for urgent issues identified:   Provider Method of Notification:    Pharmacist comments:   Time spent performing this drug regimen review (minutes):  25   Estela Held, PharmD PGY-2 Infectious Diseases Pharmacy Resident [facility] 08/09/2023 1:14 PM

## 2023-08-09 NOTE — Progress Notes (Signed)
 Physical Therapy Discharge Summary  Patient Details  Name: Logan French MRN: [id number] Date of Birth: Oct 13, 1934  Date of Discharge from PT service:Aug 09, 2023  Today's Date: 08/09/2023 PT Individual Time: 0731-0842 PT Individual Time Calculation (min): 71 min   Patient has met 8 of 8 long term goals due to improved activity tolerance, improved balance, improved postural control, increased strength, increased range of motion, decreased pain, ability to compensate for deficits, improved awareness, and improved coordination.  Patient to discharge at an ambulatory level Modified Independent using RW. Patient's care partner is independent to provide the necessary physical assistance at discharge.  All goals met  Recommendation:  Patient will benefit from ongoing skilled PT services in home health setting to continue to advance safe functional mobility, address ongoing impairments in transfers, generalized strengthening and endurance, dynamic standing balance/coordination, ambulation, stair navigation, and to minimize fall risk.  Equipment: No equipment provided  Reasons for discharge: treatment goals met and discharge from hospital  Patient/family agrees with progress made and goals achieved: Yes  Today's Interventions: Received pt sitting in WC, pt agreeable to PT treatment, and denied any pain during session. Session with emphasis on discharge planning, functional mobility/transfers, generalized strengthening and endurance, dynamic standing balance/coordination, NMR, stair navigation, and ambulation. Went through sensation, MMT, and pain interference questionnaire in preparation for discharge. MD arrived for morning rounds (required increased time as pt with numerous questions this morning). Pt also requesting to shower with OT - primary OT notified.   Pt performed all transfers with RW and mod I throughout session. Pt ambulated >138ft x 2 trials with RW and mod I to/from main  therapy gym. Pt navigated to staircase and navigated 12 6in steps with supervision - first 4 steps using 2 handrails and last 8 steps using R handrail only to simulate stairs at home.   Ambulated 69ft with RW and mod I to mat and able to stand and pick up object from floor using RW and reacher mod I. Pt then weaved in/out of cones ~64ft with RW and supervision/mod I with emphasis on navigating tight turns and narrow spaces. Pt then navigated ~54ft over 6in and 9in hurdles with RW and close supervision with cues to get closer to hurdle prior to stepping. Noted pt compensating with circumduction around hurdle instead of directly stepping over.   Worked on blocked practice sit<>stands from elevated EOM on [employer] 2x10 pushing up with BUE with close supervision/CGA for balance with emphasis on quad strength. Pt then ambulated additional 456ft with RW and mod I fading to supervision with increasing fatigue. Took seated rest break then ambulated back to room. Concluded session with pt sitting in [facility] with all needs within reach.  PT Discharge Precautions/Restrictions Precautions Precautions: Fall Precaution/Restrictions Comments: BLE wounds Restrictions Weight Bearing Restrictions Per Provider Order: No Pain Interference Pain Interference Pain Effect on Sleep: 0. Does not apply - I have not had any pain or hurting in the past 5 days Pain Interference with Therapy Activities: 0. Does not apply - I have not received rehabilitationtherapy in the past 5 days Pain Interference with Day-to-Day Activities: 1. Rarely or not at all Cognition Overall Cognitive Status: Within Functional Limits for tasks assessed Arousal/Alertness: Awake/alert Orientation Level: Oriented X4 Memory: Appears intact Awareness: Impaired (slightly decreased awareness into deficits) Problem Solving: Appears intact Safety/Judgment: Appears intact Sensation Sensation Light Touch: Appears Intact Hot/Cold: Not tested Proprioception:  Appears Intact Stereognosis: Not tested Coordination Gross Motor Movements are Fluid and Coordinated: Yes Fine Motor  Movements are Fluid and Coordinated: Yes Coordination and Movement Description: global weakness/deconditioning, fatigue, discomfort in LLE Finger Nose Finger Test: [facility] bilaterally Heel Shin Test: [facility] bilaterally Motor  Motor Motor: Within Functional Limits Motor - Skilled Clinical Observations: global weakness/deconditioning, fatigue, discomfort in LLE - improved since eval  Mobility Bed Mobility Bed Mobility: Rolling Right;Rolling Left;Sit to Supine;Supine to Sit Rolling Right: Independent with assistive device Rolling Left: Independent with assistive device Supine to Sit: Independent with assistive device Sit to Supine: Independent with assistive device Transfers Transfers: Sit to Stand;Stand to Sit;Stand Pivot Transfers Sit to Stand: Independent with assistive device Stand to Sit: Independent with assistive device Stand Pivot Transfers: Independent with assistive device Transfer (Assistive device): Rolling walker Locomotion  Gait Ambulation: Yes Gait Assistance: Independent with assistive device Gait Distance (Feet): 150 Feet Assistive device: Rolling walker Gait Gait: Yes Gait Pattern: Impaired Gait Pattern: Decreased stride length;Poor foot clearance - left;Poor foot clearance - right;Narrow base of support;Step-through pattern;Decreased step length - right;Decreased step length - left;Trunk flexed Gait velocity: decreased Stairs / Additional Locomotion Stairs: Yes Stairs Assistance: Supervision/Verbal cueing Stair Management Technique: One rail Right;Two rails Number of Stairs: 12 Height of Stairs: 6 Ramp: Supervision/Verbal cueing (RW) Curb: Supervision/Verbal cueing (RW) Pick up small object from the floor assist level: Independent with assistive device Pick up small object from the floor assistive device: reacher and RW Wheelchair  Mobility Wheelchair Mobility: No  Trunk/Postural Assessment  Cervical Assessment Cervical Assessment: Exceptions to [facility] (forward head) Thoracic Assessment Thoracic Assessment: Exceptions to [facility] (rounded shoulders) Lumbar Assessment Lumbar Assessment: Exceptions to [facility] (posterior pelvic tilt) Postural Control Postural Control: Within Functional Limits  Balance Balance Balance Assessed: Yes Static Sitting Balance Static Sitting - Balance Support: Feet supported;Bilateral upper extremity supported Static Sitting - Level of Assistance: 7: Independent Dynamic Sitting Balance Dynamic Sitting - Balance Support: Feet supported;No upper extremity supported Dynamic Sitting - Level of Assistance: 6: Modified independent (Device/Increase time) Static Standing Balance Static Standing - Balance Support: During functional activity;Bilateral upper extremity supported (RW) Static Standing - Level of Assistance: 6: Modified independent (Device/Increase time) Dynamic Standing Balance Dynamic Standing - Balance Support: Bilateral upper extremity supported;During functional activity (RW) Dynamic Standing - Level of Assistance: 6: Modified independent (Device/Increase time) Extremity Assessment  RLE Assessment RLE Assessment: Exceptions to [facility] General Strength Comments: tested sitting in WC RLE Strength Right Hip Flexion: 4/5 Right Hip ABduction: 4+/5 Right Hip ADduction: 4+/5 Right Knee Flexion: 4/5 Right Knee Extension: 4/5 Right Ankle Dorsiflexion: 4+/5 Right Ankle Plantar Flexion: 4+/5 LLE Assessment LLE Assessment: Exceptions to [facility] General Strength Comments: tested sitting in WC LLE Strength Left Hip Flexion: 4-/5 Left Hip ABduction: 4+/5 Left Hip ADduction: 4+/5 Left Knee Flexion: 4-/5 Left Knee Extension: 4/5 Left Ankle Dorsiflexion: 4+/5 Left Ankle Plantar Flexion: 4+/5  Vergene Marland M Zaunegger Nena Bank PT, DPT 08/09/2023, 6:54 AM

## 2023-08-09 NOTE — Progress Notes (Signed)
 Occupational Therapy Session Note  Patient Details  Name: Logan French MRN: [id number] Date of Birth: 05/05/1934  Today's Date: 08/09/2023 OT Individual Time: 4098-1191 OT Individual Time Calculation (min): 70 min    Short Term Goals: Week 2:  OT Short Term Goal 1 (Week 2): STG = LTG due to ELOS  Skilled Therapeutic Interventions/Progress Updates:  Skilled OT intervention completed with focus on ADL retraining, functional endurance, and mobility within a shower context. Pt received seated in w/c, agreeable to session. No pain reported. Pt requesting a shower.  Pt completed all sit > stands and ambulatory transfers with mod I using RW excluding shower transfer- requiring supervision/CGA and min cues.  Ambulated > shower, with cues needed for backing up to TTB then doffing shorts vs doffing in standing and trying to step out of them. Per current MD note, LLE still to be covered during shower but RLE okay to be left uncovered and wound washed with soap/water. Waterproof cover applied to LLE prior to shower. Pt was able to bathe all parts with supervision, at the sit > stand level while using grab bar for balance. Ambulated > w/c. Able to donn shirt/deo with mod I. Threaded LB clothing with supervision and increased time at the sit > stand level with RW. Pt able to donn both shoes this date with figure 4 position. Pt declined coverage of R calf wound, requesting it to "air dry" despite education on keeping it covered for infection prevention and moisture for proper healing. Nurse notified of status.  Seated at sink, pt shaved and completed oral care with mod I. Pt remained in w/c, with chair alarm on/activated, and with all needs in reach at end of session.   Therapy Documentation Precautions:  Precautions Precautions: Fall Precaution/Restrictions Comments: BLE wounds Restrictions Weight Bearing Restrictions Per Provider Order: No    Therapy/Group: Individual Therapy  Ruthanna Covert, MS, OTR/L  08/09/2023, 2:15 PM

## 2023-08-10 ENCOUNTER — Other Ambulatory Visit (HOSPITAL_COMMUNITY): Payer: Self-pay

## 2023-08-10 ENCOUNTER — Telehealth (HOSPITAL_COMMUNITY): Payer: Self-pay | Admitting: Pharmacy Technician

## 2023-08-10 ENCOUNTER — Inpatient Hospital Stay (HOSPITAL_COMMUNITY)

## 2023-08-10 ENCOUNTER — Encounter (HOSPITAL_COMMUNITY)

## 2023-08-10 MED ORDER — FINASTERIDE 5 MG PO TABS: 5.0000 mg | ORAL_TABLET | Freq: Every day | ORAL | Status: DC

## 2023-08-10 NOTE — Telephone Encounter (Signed)
 Pharmacy Patient Advocate Encounter  Received notification from [employer] that Prior Authorization for Santyl 250 unit/gm ointment has been APPROVED from 08/10/2023 to 03/15/2024. Ran test claim, Copay is $298.79. This test claim was processed through [facility]- copay amounts may vary at other pharmacies due to pharmacy/plan contracts, or as the patient moves through the different stages of their insurance plan.  Copay applied to the $590.00 deductible.  PA #/Case ID/Reference #: [id number]

## 2023-08-10 NOTE — Progress Notes (Signed)
 PROGRESS NOTE   Subjective/Complaints: No new complaints this morning Asks what exercises he can do maximize progress Leg looks much better!  ROS: Patient denies fever, rash, sore throat, blurred vision, dizziness, nausea, vomiting, diarrhea, cough, shortness of breath or chest pain, joint or back/neck pain, headache, or mood change.     Objective:   No results found.  No results for input(s): "WBC", "HGB", "HCT", "PLT" in the last 72 hours.   No results for input(s): "NA", "K", "CL", "CO2", "GLUCOSE", "BUN", "CREATININE", "CALCIUM " in the last 72 hours.    Intake/Output Summary (Last 24 hours) at 08/10/2023 0947 Last data filed at 08/10/2023 0742 Gross per 24 hour  Intake 1080 ml  Output 930 ml  Net 150 ml        Physical Exam: Vital Signs Blood pressure (!) 140/85, pulse 75, temperature 97.8 F (36.6 C), temperature source Oral, resp. rate 17, height 6' (1.829 m), weight 93.7 kg, SpO2 99%.   Constitutional: No distress . Vital signs reviewed. HEENT: NCAT, EOMI, oral membranes moist Neck: supple Cardiovascular: RRR without murmur. No JVD    Respiratory/Chest: CTA Bilaterally without wheezes or rales. Normal effort    GI/Abdomen: BS +, non-tender, non-distended Ext: no clubbing, cyanosis, or edema Psych: pleasant and cooperative  Skin: all wounds healing nicely, closing with minimal drainage        MSK:    Left lower extremity mildly tender to palpation on anterior shin and ankle.  Otherwise, full active range of motion in all 4 extremities.  Neuro:     Comments: Alert and oriented x 3. Normal insight and awareness. Intact Memory. Normal language and speech. Cranial nerve exam unremarkable. MMT: BUE grossly 4/5 prox to distal. BLE 3+HF and KE and 4- ADF/PF. Mild sensory loss distally in both legs but otherwise sensory exam appears intact. DTR's 1+. No abnl resting tone.  Stable  5.27   Assessment/Plan: 1. Functional deficits which require 3+ hours per day of interdisciplinary therapy in a comprehensive inpatient rehab setting. Physiatrist is providing close team supervision and 24 hour management of active medical problems listed below. Physiatrist and rehab team continue to assess barriers to discharge/monitor patient progress toward functional and medical goals  Care Tool:  Bathing    Body parts bathed by patient: Right arm, Left arm, Chest, Abdomen, Front perineal area, Right upper leg, Left upper leg, Face, Buttocks   Body parts bathed by helper: Buttocks, Right lower leg Body parts n/a: Left lower leg, Right lower leg (covered by dressings)   Bathing assist Assist Level: Supervision/Verbal cueing     Upper Body Dressing/Undressing Upper body dressing   What is the patient wearing?: Pull over shirt    Upper body assist Assist Level: Independent with assistive device    Lower Body Dressing/Undressing Lower body dressing      What is the patient wearing?: Incontinence brief, Pants     Lower body assist Assist for lower body dressing: Supervision/Verbal cueing     Toileting Toileting Toileting Activity did not occur (Clothing management and hygiene only): N/A (no void or bm)  Toileting assist Assist for toileting: Independent with assistive device     Transfers Chair/bed transfer  Transfers assist  Chair/bed transfer activity did not occur: Safety/medical concerns  Chair/bed transfer assist level: Independent with assistive device Chair/bed transfer assistive device: Geologist, engineering   Ambulation assist      Assist level: Independent with assistive device Assistive device: Walker-rolling Max distance: >128ft   Walk 10 feet activity   Assist     Assist level: Independent with assistive device Assistive device: Walker-rolling   Walk 50 feet activity   Assist    Assist level: Independent with assistive  device Assistive device: Walker-rolling    Walk 150 feet activity   Assist Walk 150 feet activity did not occur: Safety/medical concerns (fatigue, discomofort in LLE)  Assist level: Independent with assistive device Assistive device: Walker-rolling    Walk 10 feet on uneven surface  activity   Assist     Assist level: Supervision/Verbal cueing Assistive device: Walker-rolling   Wheelchair     Assist Is the patient using a wheelchair?: No Type of Wheelchair: Manual Wheelchair activity did not occur: N/A  Wheelchair assist level: Supervision/Verbal cueing Max wheelchair distance: 165    Wheelchair 50 feet with 2 turns activity    Assist    Wheelchair 50 feet with 2 turns activity did not occur: N/A   Assist Level: Supervision/Verbal cueing   Wheelchair 150 feet activity     Assist  Wheelchair 150 feet activity did not occur: N/A   Assist Level: Supervision/Verbal cueing   Blood pressure (!) 140/85, pulse 75, temperature 97.8 F (36.6 C), temperature source Oral, resp. rate 17, height 6' (1.829 m), weight 93.7 kg, SpO2 99%.  Medical Problem List and Plan: 1. Functional deficits secondary to sepsis secondary to cellulitis.  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  Plan follow-up Dopplers 4 to 6 weeks.             -patient may  shower if left leg is covered             -ELOS/Goals: dc home 5/27   -he would like to neuro rehab [address] [facility] for PT. Has HHPT [facility] it appears.    -discussed no submersion of wounds, pool until completely healed  -d/c home today  2.  Antithrombotics: -DVT/anticoagulation:  Mechanical: Antiembolism stockings, thigh (TED hose) Bilateral lower extremities             -antiplatelet therapy: conitnue Plavix  75 mg daily  3. Pain Management: d/c oxycodone  since not requiring             -pain seems fairly controlled 4. Mood/Behavior/Sleep: Provide emotional support              -antipsychotic agents: N/A 5. Neuropsych/cognition: This patient is capable of making decisions on his own behalf.  6. Wounds on both legs appear to be healing nicely.              -continue dry dressings with kerlix to left calf and foot as well as right achilles area, asked nursing to change dressing today  -5/14 seen by [facility]- for leg wounds  -5/18 images updated for the chart  5/27 see discussion above 7. Fluids/Electrolytes/Nutrition: Routine in and outs with follow-up chemistries  - 5-19: Labs back with reduced NA 132 and increased K 5.3.  Fairly elevated and asymptomatic, will repeat in a.m. and consider Lokelma.  Did encourage p.o. fluids with patient today.  8.  ID/cellulitis.[name]  restarted doxycyline given increased redness, tenderness, weeping in LLE.  9.  AKI.  Resolved.  Follow-up chemistries  - 5/14 BUN/creatinine stable  5-19: BUN/creatinine stable  10.  Diabetes mellitus.  Hemoglobin A1c 6.4. d/c ISS  - Blood sugars well-controlled on BMPs   11.  Hypertension.  D/c norvasc , increase proscar  back to 5mg                  08/10/2023    5:29 AM 08/09/2023   10:02 PM 08/09/2023    7:52 PM  Vitals with BMI  Systolic 140 150 161  Diastolic 85 91 91  Pulse 75 107 107   -5/26 might benefit from resuming at least low dose norvasc   12.  History of prostate cancer.  Increase proscar  back to 5mg   13.  Atrial fibrillation.  Cardiac rate controlled.  Continue Librexia trial drug per cardiology services as well as Toprol -XL 12.5 mg every 12 hours, conitnue  14.  Hyperlipidemia:continue Crestor /Zetia   15.  Elevated LFTs: d/c tylenol   16. Tachypnea: resolved, discussed that CXR is stable  17. Left groin pain: discussed that hip XR shows no acute abnormalities, recommended applying kpad  18. Hyponatremia: discussed that this has improved  19. Leukocytosis: WBC reviewed and this has resolved  20. Decreased left knee flexion: XR ordered and discussed that this shows  degeneration   >30 minutes spent in discharge of patient including review of medications and follow-up appointments, physical examination, and in answering all patient's questions   LOS: 14 days A FACE TO FACE EVALUATION WAS PERFORMED  Keven Pel Jens Siems 08/10/2023, 9:47 AM

## 2023-08-10 NOTE — Progress Notes (Signed)
 Inpatient Rehabilitation Care Coordinator Discharge Note   Patient Details  Name: Logan French MRN: [id number] Date of Birth: 1934/12/05   Discharge location: HOME WITH WIFE AND HIRED CAREGIVER  Length of Stay: 14 DAYS  Discharge activity level: MOD/I-SUPERVISION  Home/community participation: ACTIVE  Patient response WU:JWJXBJ Literacy - How often do you need to have someone help you when you read instructions, pamphlets, or other written material from your doctor or pharmacy?: Never  Patient response YN:WGNFAO Isolation - How often do you feel lonely or isolated from those around you?: Never  Services provided included: MD, RD, PT, OT, RN, CM, TR, Pharmacy, SW  Financial Services:  Financial Services Utilized: Medicare    Choices offered to/list presented to: PT AND SON  Follow-up services arranged:  Home Health, Patient/Family has no preference for HH/DME agencies Home Health Agency: [facility]  PT  OT  RN     HAS ALL DME FROM PAST ADMISSIONS    Patient response to transportation need: Is the patient able to respond to transportation needs?: Yes In the past 12 months, has lack of transportation kept you from medical appointments or from getting medications?: No In the past 12 months, has lack of transportation kept you from meetings, work, or from getting things needed for daily living?: No   Patient/Family verbalized understanding of follow-up arrangements:  Yes  Individual responsible for coordination of the follow-up plan: SELF [phone]  Confirmed correct DME delivered: Logan French 08/10/2023    Comments (or additional information):HOME EVALUATION DONE AND WENT WELL. SON HAS HIRED CNA FOR ASSIST FOR PT FOR SHORT TERM.   Summary of Stay    Date/Time Discharge Planning CSW  08/04/23 1019 Home with wife who can assist some, family education today with wife and son from [place]. Other son to hire some assist. Will he be ready on Friday  for DC? RGD  07/28/23 1610 TBA. AAC       Logan French

## 2023-08-11 DIAGNOSIS — Z7902 Long term (current) use of antithrombotics/antiplatelets: Secondary | ICD-10-CM | POA: Diagnosis not present

## 2023-08-11 DIAGNOSIS — N401 Enlarged prostate with lower urinary tract symptoms: Secondary | ICD-10-CM | POA: Diagnosis not present

## 2023-08-11 DIAGNOSIS — N39498 Other specified urinary incontinence: Secondary | ICD-10-CM | POA: Diagnosis not present

## 2023-08-11 DIAGNOSIS — M109 Gout, unspecified: Secondary | ICD-10-CM | POA: Diagnosis not present

## 2023-08-11 DIAGNOSIS — Z4800 Encounter for change or removal of nonsurgical wound dressing: Secondary | ICD-10-CM | POA: Diagnosis not present

## 2023-08-11 DIAGNOSIS — L97811 Non-pressure chronic ulcer of other part of right lower leg limited to breakdown of skin: Secondary | ICD-10-CM | POA: Diagnosis not present

## 2023-08-11 DIAGNOSIS — F1090 Alcohol use, unspecified, uncomplicated: Secondary | ICD-10-CM | POA: Diagnosis not present

## 2023-08-11 DIAGNOSIS — Z8546 Personal history of malignant neoplasm of prostate: Secondary | ICD-10-CM | POA: Diagnosis not present

## 2023-08-11 DIAGNOSIS — Z860100 Personal history of colon polyps, unspecified: Secondary | ICD-10-CM | POA: Diagnosis not present

## 2023-08-11 DIAGNOSIS — E785 Hyperlipidemia, unspecified: Secondary | ICD-10-CM | POA: Diagnosis not present

## 2023-08-11 DIAGNOSIS — I10 Essential (primary) hypertension: Secondary | ICD-10-CM | POA: Diagnosis not present

## 2023-08-11 DIAGNOSIS — I482 Chronic atrial fibrillation, unspecified: Secondary | ICD-10-CM | POA: Diagnosis not present

## 2023-08-11 DIAGNOSIS — D510 Vitamin B12 deficiency anemia due to intrinsic factor deficiency: Secondary | ICD-10-CM | POA: Diagnosis not present

## 2023-08-11 DIAGNOSIS — I872 Venous insufficiency (chronic) (peripheral): Secondary | ICD-10-CM | POA: Diagnosis not present

## 2023-08-11 DIAGNOSIS — E1151 Type 2 diabetes mellitus with diabetic peripheral angiopathy without gangrene: Secondary | ICD-10-CM | POA: Diagnosis not present

## 2023-08-11 DIAGNOSIS — D6869 Other thrombophilia: Secondary | ICD-10-CM | POA: Diagnosis not present

## 2023-08-11 DIAGNOSIS — K5790 Diverticulosis of intestine, part unspecified, without perforation or abscess without bleeding: Secondary | ICD-10-CM | POA: Diagnosis not present

## 2023-08-11 DIAGNOSIS — L97511 Non-pressure chronic ulcer of other part of right foot limited to breakdown of skin: Secondary | ICD-10-CM | POA: Diagnosis not present

## 2023-08-11 DIAGNOSIS — Z7984 Long term (current) use of oral hypoglycemic drugs: Secondary | ICD-10-CM | POA: Diagnosis not present

## 2023-08-12 DIAGNOSIS — E1151 Type 2 diabetes mellitus with diabetic peripheral angiopathy without gangrene: Secondary | ICD-10-CM | POA: Diagnosis not present

## 2023-08-12 DIAGNOSIS — I482 Chronic atrial fibrillation, unspecified: Secondary | ICD-10-CM | POA: Diagnosis not present

## 2023-08-12 DIAGNOSIS — I872 Venous insufficiency (chronic) (peripheral): Secondary | ICD-10-CM | POA: Diagnosis not present

## 2023-08-12 DIAGNOSIS — L97511 Non-pressure chronic ulcer of other part of right foot limited to breakdown of skin: Secondary | ICD-10-CM | POA: Diagnosis not present

## 2023-08-12 DIAGNOSIS — L97811 Non-pressure chronic ulcer of other part of right lower leg limited to breakdown of skin: Secondary | ICD-10-CM | POA: Diagnosis not present

## 2023-08-12 DIAGNOSIS — Z4800 Encounter for change or removal of nonsurgical wound dressing: Secondary | ICD-10-CM | POA: Diagnosis not present

## 2023-08-13 DIAGNOSIS — I10 Essential (primary) hypertension: Secondary | ICD-10-CM | POA: Diagnosis not present

## 2023-08-16 ENCOUNTER — Other Ambulatory Visit: Payer: Self-pay | Admitting: *Deleted

## 2023-08-16 DIAGNOSIS — L97811 Non-pressure chronic ulcer of other part of right lower leg limited to breakdown of skin: Secondary | ICD-10-CM | POA: Diagnosis not present

## 2023-08-16 DIAGNOSIS — Z4800 Encounter for change or removal of nonsurgical wound dressing: Secondary | ICD-10-CM | POA: Diagnosis not present

## 2023-08-16 DIAGNOSIS — I872 Venous insufficiency (chronic) (peripheral): Secondary | ICD-10-CM | POA: Diagnosis not present

## 2023-08-16 DIAGNOSIS — E1151 Type 2 diabetes mellitus with diabetic peripheral angiopathy without gangrene: Secondary | ICD-10-CM | POA: Diagnosis not present

## 2023-08-16 DIAGNOSIS — L97511 Non-pressure chronic ulcer of other part of right foot limited to breakdown of skin: Secondary | ICD-10-CM | POA: Diagnosis not present

## 2023-08-16 DIAGNOSIS — I70221 Atherosclerosis of native arteries of extremities with rest pain, right leg: Secondary | ICD-10-CM

## 2023-08-16 DIAGNOSIS — I482 Chronic atrial fibrillation, unspecified: Secondary | ICD-10-CM | POA: Diagnosis not present

## 2023-08-18 ENCOUNTER — Telehealth: Payer: Self-pay

## 2023-08-18 DIAGNOSIS — D649 Anemia, unspecified: Secondary | ICD-10-CM | POA: Diagnosis not present

## 2023-08-18 DIAGNOSIS — I10 Essential (primary) hypertension: Secondary | ICD-10-CM | POA: Diagnosis not present

## 2023-08-18 DIAGNOSIS — A419 Sepsis, unspecified organism: Secondary | ICD-10-CM | POA: Diagnosis not present

## 2023-08-18 DIAGNOSIS — I87311 Chronic venous hypertension (idiopathic) with ulcer of right lower extremity: Secondary | ICD-10-CM | POA: Diagnosis not present

## 2023-08-18 DIAGNOSIS — I739 Peripheral vascular disease, unspecified: Secondary | ICD-10-CM | POA: Diagnosis not present

## 2023-08-18 DIAGNOSIS — R7881 Bacteremia: Secondary | ICD-10-CM | POA: Diagnosis not present

## 2023-08-18 DIAGNOSIS — R600 Localized edema: Secondary | ICD-10-CM | POA: Diagnosis not present

## 2023-08-18 DIAGNOSIS — E785 Hyperlipidemia, unspecified: Secondary | ICD-10-CM | POA: Diagnosis not present

## 2023-08-18 DIAGNOSIS — L03116 Cellulitis of left lower limb: Secondary | ICD-10-CM | POA: Diagnosis not present

## 2023-08-18 DIAGNOSIS — L97919 Non-pressure chronic ulcer of unspecified part of right lower leg with unspecified severity: Secondary | ICD-10-CM | POA: Diagnosis not present

## 2023-08-18 DIAGNOSIS — R7302 Impaired glucose tolerance (oral): Secondary | ICD-10-CM | POA: Diagnosis not present

## 2023-08-18 DIAGNOSIS — L98499 Non-pressure chronic ulcer of skin of other sites with unspecified severity: Secondary | ICD-10-CM | POA: Diagnosis not present

## 2023-08-18 NOTE — Telephone Encounter (Signed)
 Logan French with Dr. Jennet Mode office would like a call back concerning patient.  CB# [phone].  Please advise.  Thank you.

## 2023-08-19 ENCOUNTER — Telehealth: Payer: Self-pay

## 2023-08-19 DIAGNOSIS — I872 Venous insufficiency (chronic) (peripheral): Secondary | ICD-10-CM | POA: Diagnosis not present

## 2023-08-19 DIAGNOSIS — E1151 Type 2 diabetes mellitus with diabetic peripheral angiopathy without gangrene: Secondary | ICD-10-CM | POA: Diagnosis not present

## 2023-08-19 DIAGNOSIS — Z4800 Encounter for change or removal of nonsurgical wound dressing: Secondary | ICD-10-CM | POA: Diagnosis not present

## 2023-08-19 DIAGNOSIS — L97811 Non-pressure chronic ulcer of other part of right lower leg limited to breakdown of skin: Secondary | ICD-10-CM | POA: Diagnosis not present

## 2023-08-19 DIAGNOSIS — I482 Chronic atrial fibrillation, unspecified: Secondary | ICD-10-CM | POA: Diagnosis not present

## 2023-08-19 DIAGNOSIS — L97511 Non-pressure chronic ulcer of other part of right foot limited to breakdown of skin: Secondary | ICD-10-CM | POA: Diagnosis not present

## 2023-08-19 NOTE — Telephone Encounter (Signed)
 I called and lm on vm for Logan French who is taking call =s for Dr. Efraim Grange today. Advised to give a call back and let me know what the pt needs and I will take care of it.

## 2023-08-19 NOTE — Telephone Encounter (Signed)
 Moira Andrews called back from Dr. Vernel Golds office and states that the pt was in the office yesterday and that his leg is weeping and swollen had a very strong odor. He has refused [facility] health nursing. She is very fearful that the pt is at risk to lose the limb if not his life. I advised I would reach out to the pt today and make an appt I called the pt's spouse Haskell Linker and was not able to reach her. I called the home number and the pt answered. He states that he has too many things  going on today and that he can not come in but he is willing to come in tomorrow with [place]. Appt sch for 10:30

## 2023-08-20 ENCOUNTER — Ambulatory Visit (INDEPENDENT_AMBULATORY_CARE_PROVIDER_SITE_OTHER): Admitting: Family

## 2023-08-20 DIAGNOSIS — I739 Peripheral vascular disease, unspecified: Secondary | ICD-10-CM | POA: Diagnosis not present

## 2023-08-20 DIAGNOSIS — L03115 Cellulitis of right lower limb: Secondary | ICD-10-CM | POA: Diagnosis not present

## 2023-08-20 DIAGNOSIS — L97521 Non-pressure chronic ulcer of other part of left foot limited to breakdown of skin: Secondary | ICD-10-CM

## 2023-08-20 NOTE — Progress Notes (Signed)
 Office Visit Note   Patient: Logan French           Date of Birth: 1934/11/26           MRN: [id number] Visit Date: 08/20/2023              Requested by: Yolande Toribio MATSU, MD [address] [place],  [place] [address] PCP: Yolande Toribio MATSU, MD  Chief Complaint  Patient presents with   Right Leg - Wound Check   Left Leg - Wound Check      HPI: The patient is an 89 year old gentleman who is seen in follow-up for ulcerations to bilateral lower extremities.  He is status post revascularization of the right lower extremity he is currently using Vashe for wound care cannot tolerate PRAFO boots.  He also poorly tolerates compression garments as well as elevation.  They have begun using Santyl  on the left ulcer  Assessment & Plan: Visit Diagnoses: No diagnosis found.  Plan: Discussed wound care at length.  He will begin showering will use Dial soap and water for wound cleansing when he showers every other day he will cleanse with Vashe.  They will use a Vashe soaked dressing to the right posterior calf ulcer.  To the left posterior calf ulcer they will continue use of thin layer of Santyl  packed with a 4 x 4 and use Ace wrap at minimum for compression.  Encouraged him to purchase compression garments and wear these around-the-clock.  They are aware of need for elevation.  Follow-Up Instructions: No follow-ups on file.   Ortho Exam  Patient is alert, oriented, no adenopathy, well-dressed, normal affect, normal respiratory effort. On examination of the right lower extremity there is moderate edema present with dermatitis consistent with venous insufficiency.  Posteriorly over the Achilles area ulceration present this is 1.5 centimeters by 5 mm there is thin fibrinous tissue in the wound bed.  No surrounding erythema or purulence  On examination left lower extremity there is pitting edema present with mild erythema no cellulitis consistent with venous insufficiency and dermatitis.   Posterior calf ulcer present this is 3 and half centimeters by 2 and half centimeters with 8 mm of depth there is 75% fibrinous exudative tissue which is debrided with gauze.  No surrounding erythema no purulence    Imaging: No results found. No images are attached to the encounter.  Labs: Lab Results  Component Value Date   HGBA1C 6.4 (H) 06/17/2023   CRP 26.9 (H) 06/23/2023   CRP 27.5 (H) 06/22/2023   CRP 27.6 (H) 06/21/2023   LABURIC 5.0 06/22/2023   LABURIC 5.3 06/21/2023   REPTSTATUS 07/20/2023 FINAL 07/15/2023   GRAMSTAIN Rare 10/29/2015   GRAMSTAIN WBC present-predominately Mononuclear 10/29/2015   GRAMSTAIN No Squamous Epithelial Cells Seen 10/29/2015   GRAMSTAIN Moderate Gram Positive Cocci In Pairs In Clusters 10/29/2015   CULT  07/15/2023    NO GROWTH 5 DAYS Performed at [facility], [address], [place], [place] [address]    LABORGA GROUP A STREP (S.PYOGENES) ISOLATED 06/17/2023     Lab Results  Component Value Date   ALBUMIN 2.0 (L) 08/04/2023   ALBUMIN 2.2 (L) 08/02/2023   ALBUMIN 2.1 (L) 07/28/2023    Lab Results  Component Value Date   MG 2.0 06/23/2023   MG 2.0 06/22/2023   MG 2.0 06/21/2023   Lab Results  Component Value Date   VD25OH 27.89 (L) 06/26/2023    No results found for: PREALBUMIN  Latest Ref Rng & Units 08/07/2023    7:25 AM 08/06/2023    6:43 AM 08/05/2023   10:46 AM  CBC EXTENDED  WBC 4.0 - 10.5 K/uL 6.9  7.7  11.8   RBC 4.22 - 5.81 MIL/uL 4.16  3.92  4.40   Hemoglobin 13.0 - 17.0 g/dL 88.2  89.0  87.7   HCT 39.0 - 52.0 % 35.9  33.7  37.8   Platelets 150 - 400 K/uL 385  368  458   NEUT# 1.7 - 7.7 K/uL 4.5  5.1  9.7   Lymph# 0.7 - 4.0 K/uL 1.5  1.6  1.0      There is no height or weight on file to calculate BMI.  Orders:  No orders of the defined types were placed in this encounter.  No orders of the defined types were placed in this encounter.    Procedures: No procedures performed  Clinical  Data: No additional findings.  ROS:  All other systems negative, except as noted in the HPI. Review of Systems  Objective: Vital Signs: There were no vitals taken for this visit.  Specialty Comments:  No specialty comments available.  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) 06/20/2023   Streptococcal bacteremia 06/18/2023   Cellulitis of right lower extremity 06/17/2023   Persistent atrial fibrillation (HCC) 09/23/2021   Hypercoagulable state due to persistent atrial fibrillation (HCC) 09/23/2021   Callus 10/25/2019   Corns and callosities 10/25/2019   Hav (hallux abducto valgus), unspecified laterality 10/25/2019   Altered awareness, transient 06/23/2017   MALLET FINGER, RIGHT RING FINGER 10/19/2008   Past Medical History:  Diagnosis Date   A-fib (HCC)    B12 deficiency    Diverticulosis    Fatigue    History of colonic polyps    Hyperlipemia    Hypertension    Pernicious anemia    Prostate cancer (HCC)    Syncope    Syncope     No family history on file.  Past Surgical History:  Procedure Laterality Date   ABDOMINAL AORTOGRAM N/A 07/20/2023   Procedure: ABDOMINAL AORTOGRAM;  Surgeon: Serene Gaile ORN, MD;  Location: [facility] INVASIVE CV LAB;  Service: Cardiovascular;  Laterality: N/A;   ABDOMINAL AORTOGRAM W/LOWER EXTREMITY N/A 05/17/2023   Procedure: ABDOMINAL AORTOGRAM W/LOWER EXTREMITY;  Surgeon: Pearline Norman RAMAN, MD;  Location: [facility] INVASIVE CV LAB;  Service: Cardiovascular;   Laterality: N/A;   CARDIOVERSION N/A 10/07/2021   Procedure: CARDIOVERSION;  Surgeon: Sheena Pugh, DO;  Location: [facility] ENDOSCOPY;  Service: Cardiovascular;  Laterality: N/A;   IR ANGIO INTRA EXTRACRAN SEL COM CAROTID INNOMINATE BILAT MOD SED  09/10/2017   IR ANGIO VERTEBRAL SEL SUBCLAVIAN INNOMINATE BILAT MOD SED  09/10/2017   LOWER EXTREMITY ANGIOGRAPHY N/A 07/20/2023   Procedure: Lower Extremity Angiography;  Surgeon: Serene Gaile ORN, MD;  Location: [facility];  Service: Cardiovascular;  Laterality: N/A;   LOWER EXTREMITY INTERVENTION Left 07/20/2023   Procedure: LOWER EXTREMITY INTERVENTION;  Surgeon: Serene Gaile ORN, MD;  Location: [facility];  Service: Cardiovascular;  Laterality: Left;   PERIPHERAL INTRAVASCULAR LITHOTRIPSY Right 05/17/2023   Procedure: PERIPHERAL INTRAVASCULAR LITHOTRIPSY;  Surgeon: Pearline Norman RAMAN, MD;  Location: [facility] INVASIVE CV LAB;  Service: Cardiovascular;  Laterality: Right;  SFA-POP   PERIPHERAL INTRAVASCULAR LITHOTRIPSY Left 07/20/2023   Procedure: PERIPHERAL INTRAVASCULAR LITHOTRIPSY;  Surgeon: Serene Gaile ORN, MD;  Location: [facility];  Service: Cardiovascular;  Laterality: Left;   PERIPHERAL VASCULAR BALLOON ANGIOPLASTY Right 05/17/2023   Procedure: PERIPHERAL VASCULAR BALLOON ANGIOPLASTY;  Surgeon: Pearline Norman RAMAN, MD;  Location: [facility];  Service: Cardiovascular;  Laterality: Right;  SFA-POP   Social History   Occupational History   Not on file  Tobacco Use   Smoking status: Never   Smokeless tobacco: Never   Tobacco comments:    Never smoke 09/23/21  Vaping Use   Vaping status: Never Used  Substance and Sexual Activity   Alcohol  use: Yes    Alcohol /week: 2.0 standard drinks of alcohol     Types: 1 Cans of beer, 1 Shots of liquor per week    Comment: occasionally   Drug use: No   Sexual activity: Not on file

## 2023-08-23 DIAGNOSIS — I872 Venous insufficiency (chronic) (peripheral): Secondary | ICD-10-CM | POA: Diagnosis not present

## 2023-08-23 DIAGNOSIS — E1151 Type 2 diabetes mellitus with diabetic peripheral angiopathy without gangrene: Secondary | ICD-10-CM | POA: Diagnosis not present

## 2023-08-23 DIAGNOSIS — L97511 Non-pressure chronic ulcer of other part of right foot limited to breakdown of skin: Secondary | ICD-10-CM | POA: Diagnosis not present

## 2023-08-23 DIAGNOSIS — L97811 Non-pressure chronic ulcer of other part of right lower leg limited to breakdown of skin: Secondary | ICD-10-CM | POA: Diagnosis not present

## 2023-08-23 DIAGNOSIS — I482 Chronic atrial fibrillation, unspecified: Secondary | ICD-10-CM | POA: Diagnosis not present

## 2023-08-23 DIAGNOSIS — Z4800 Encounter for change or removal of nonsurgical wound dressing: Secondary | ICD-10-CM | POA: Diagnosis not present

## 2023-08-24 ENCOUNTER — Encounter: Payer: Self-pay | Admitting: Infectious Diseases

## 2023-08-24 ENCOUNTER — Ambulatory Visit: Admitting: Infectious Diseases

## 2023-08-24 ENCOUNTER — Other Ambulatory Visit: Payer: Self-pay

## 2023-08-24 VITALS — BP 137/76 | HR 75 | Temp 97.7°F | Ht 72.0 in | Wt 207.0 lb

## 2023-08-24 DIAGNOSIS — L97311 Non-pressure chronic ulcer of right ankle limited to breakdown of skin: Secondary | ICD-10-CM | POA: Diagnosis not present

## 2023-08-24 DIAGNOSIS — I739 Peripheral vascular disease, unspecified: Secondary | ICD-10-CM | POA: Diagnosis not present

## 2023-08-24 DIAGNOSIS — Z79899 Other long term (current) drug therapy: Secondary | ICD-10-CM

## 2023-08-24 NOTE — Progress Notes (Addendum)
 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) 06/20/2023   Streptococcal bacteremia 06/18/2023   Cellulitis of right lower extremity 06/17/2023   Persistent atrial fibrillation (HCC) 09/23/2021   Hypercoagulable state due to persistent atrial fibrillation (HCC) 09/23/2021   Callus 10/25/2019   Corns and callosities 10/25/2019   Hav (hallux abducto valgus), unspecified laterality 10/25/2019   Altered awareness, transient 06/23/2017   MALLET FINGER, RIGHT RING FINGER 10/19/2008    Patient's Medications  New Prescriptions   No medications on file  Previous Medications   ACETAMINOPHEN  (TYLENOL ) 325 MG TABLET    Take 2 tablets (650 mg total) by mouth every 4 (four) hours as needed for headache or mild pain (pain score 1-3).   CLOPIDOGREL  (PLAVIX ) 75 MG TABLET    Take 1 tablet (75 mg total) by mouth daily.   COLCHICINE  0.6 MG TABLET    Take 1 tablet (0.6 mg total) by mouth daily.   COLLAGENASE  (SANTYL ) 250 UNIT/GM OINTMENT    Apply topically daily. Apply to affected area as directed   CYANOCOBALAMIN  (VITAMIN B12) 1000 MCG TABLET    Take 0.5 tablets (500 mcg total) by mouth daily.   DICLOFENAC  SODIUM (VOLTAREN ) 1 % GEL    Apply 2 g topically 4 (four) times daily.   DOXYCYCLINE  (VIBRA -TABS) 100 MG TABLET    Take 1 tablet (100 mg total) by mouth every 12 (twelve) hours.   EZETIMIBE  (ZETIA ) 10 MG TABLET    Take 1 tablet (10 mg total) by mouth daily.   FINASTERIDE  (PROSCAR ) 5 MG TABLET    Take 1 tablet (5 mg total) by  mouth daily.   HYDROCHLOROTHIAZIDE  (HYDRODIURIL ) 12.5 MG TABLET    Take 1 tablet (12.5 mg total) by mouth daily.   METFORMIN  (GLUCOPHAGE ) 500 MG TABLET    Take 1 tablet (500 mg total) by mouth daily with breakfast.   METOPROLOL  SUCCINATE (TOPROL -XL) 25 MG 24 HR TABLET    Take 0.5 tablets (12.5 mg total) by mouth every 12 (twelve) hours.   OXYCODONE  (OXY IR/ROXICODONE ) 5 MG IMMEDIATE RELEASE TABLET    Take 1 tablet (5 mg total) by mouth every 6 (six) hours as needed for severe pain (pain score 7-10).   PANTOPRAZOLE  (PROTONIX ) 40 MG TABLET    Take 1 tablet (40 mg total) by mouth daily.   ROSUVASTATIN  (CRESTOR ) 20 MG TABLET    Take 1 tablet (20 mg total) by mouth daily.   STUDY - LIBREXIA-AF - APIXABAN  5 MG OR PLACEBO CAPSULE (PI-SETHI)    Take 1 capsule (5 mg total) by mouth 2 (two) times daily. Take at approximately the same time of day with or without food. Please bring bottle back with you to every visit; do not discard bottle. For Investigational Use Only.   STUDY - LIBREXIA-AF - [id number] (MILVEXIAN) 100 MG OR PLACEBO TABLET (PI-SETHI)    Take 1 tablet by mouth 2 (two) times daily. Take at approximately the same time of day with or without food. Please bring bottle back with you to every visit; do  not discard bottle. For Investigational Use Only.   TRIAMCINOLONE  ACETONIDE (TRIAMCINOLONE  0.1 % CREAM : EUCERIN) CREA    Apply 1 Application topically 2 (two) times daily.   VITAMIN D3 (CHOLECALCIFEROL ) 25 MCG TABLET    Take 1 tablet (1,000 Units total) by mouth daily.  Modified Medications   No medications on file  Discontinued Medications   No medications on file    Subjective: Discussed the use of AI scribe software for clinical note transcription with the patient, who gave verbal consent to proceed.   89 Y O Male with prior h/o A fib, HLD, HTN, DM< Pernicious anemia, Prostate ca, PVD s/p vascular intervention who is here for HFU after recent hospital admission 5/13-5/27 for strep pyogenes  bacteremia/RLE cellulitis. Underwent abdominal aortogram s/p vascular intervention on 5/6. Completed course of IV Pen G > PO amoxicillin  with improvement.   6/10  Reports non-healing wounds on his legs, primarily on the right leg, persisting for approximately six weeks. The right leg wound began as a crack in the heel last summer and progressed to a hole. Despite seven weeks of treatment at a wound center, the wound remained unchanged in size and became infected. During hospitalization, blisters developed on the left leg, which have since healed, but a new wound has opened on the backside of his left calf, which is leaking. He is receiving debridement treatments and using Centany  ointment. Following with Ortho, last seen on 6/6. He had followed up with PCP and was prescribed doxycycline  for 10 days, 5 more days left. There is on and off non purulent drainage from the wounds, but no fever, chills, nausea, vomiting, or diarrhea.  He reports following with Vascular Dr Rosea Conch and an ultrasound is scheduled for the June twentieth. He maintains good sensation in his feet and denies any neuropathy.   Review of Systems: all systems reviewed with pertinent positives and negatives as listed above   Past Medical History:  Diagnosis Date   A-fib (HCC)    B12 deficiency    Diverticulosis    Fatigue    History of colonic polyps    Hyperlipemia    Hypertension    Pernicious anemia    Prostate cancer (HCC)    Syncope    Syncope    Past Surgical History:  Procedure Laterality Date   ABDOMINAL AORTOGRAM N/A 07/20/2023   Procedure: ABDOMINAL AORTOGRAM;  Surgeon: Margherita Shell, MD;  Location: [facility];  Service: Cardiovascular;  Laterality: N/A;   ABDOMINAL AORTOGRAM W/LOWER EXTREMITY N/A 05/17/2023   Procedure: ABDOMINAL AORTOGRAM W/LOWER EXTREMITY;  Surgeon: Philipp Brawn, MD;  Location: [facility] INVASIVE CV LAB;  Service: Cardiovascular;  Laterality: N/A;   CARDIOVERSION N/A 10/07/2021    Procedure: CARDIOVERSION;  Surgeon: Jerryl Morin, DO;  Location: [facility] ENDOSCOPY;  Service: Cardiovascular;  Laterality: N/A;   IR ANGIO INTRA EXTRACRAN SEL COM CAROTID INNOMINATE BILAT MOD SED  09/10/2017   IR ANGIO VERTEBRAL SEL SUBCLAVIAN INNOMINATE BILAT MOD SED  09/10/2017   LOWER EXTREMITY ANGIOGRAPHY N/A 07/20/2023   Procedure: Lower Extremity Angiography;  Surgeon: Margherita Shell, MD;  Location: [facility];  Service: Cardiovascular;  Laterality: N/A;   LOWER EXTREMITY INTERVENTION Left 07/20/2023   Procedure: LOWER EXTREMITY INTERVENTION;  Surgeon: Margherita Shell, MD;  Location: [facility];  Service: Cardiovascular;  Laterality: Left;   PERIPHERAL INTRAVASCULAR LITHOTRIPSY Right 05/17/2023   Procedure: PERIPHERAL INTRAVASCULAR LITHOTRIPSY;  Surgeon: Philipp Brawn, MD;  Location: [facility] INVASIVE CV LAB;  Service: Cardiovascular;  Laterality: Right;  SFA-POP   PERIPHERAL INTRAVASCULAR LITHOTRIPSY Left 07/20/2023   Procedure: PERIPHERAL INTRAVASCULAR LITHOTRIPSY;  Surgeon: Margherita Shell, MD;  Location: [facility];  Service: Cardiovascular;  Laterality: Left;   PERIPHERAL VASCULAR BALLOON ANGIOPLASTY Right 05/17/2023   Procedure: PERIPHERAL VASCULAR BALLOON ANGIOPLASTY;  Surgeon: Philipp Brawn, MD;  Location: [facility];  Service: Cardiovascular;  Laterality: Right;  SFA-POP    Social History   Tobacco Use   Smoking status: Never   Smokeless tobacco: Never   Tobacco comments:    Never smoke 09/23/21  Vaping Use   Vaping status: Never Used  Substance Use Topics   Alcohol  use: Yes    Alcohol /week: 2.0 standard drinks of alcohol     Types: 1 Cans of beer, 1 Shots of liquor per week    Comment: occasionally   Drug use: No    No family history on file.  No Known Allergies  Health Maintenance  Topic Date Due   FOOT EXAM  Never done   OPHTHALMOLOGY EXAM  Never done   Zoster Vaccines- Shingrix (1 of 2) 10/06/1953   Pneumonia Vaccine 19+ Years old (2 of 2  - PCV) 07/27/2013   COVID-19 Vaccine (2 - Pfizer risk series) 01/02/2020   Medicare Annual Wellness (AWV)  10/15/2023   INFLUENZA VACCINE  10/15/2023   HEMOGLOBIN A1C  12/17/2023   DTaP/Tdap/Td (3 - Tdap) 12/11/2028   HPV VACCINES  Aged Out   Meningococcal B Vaccine  Aged Out    Objective: BP 137/76   Pulse 75   Temp 97.7 F (36.5 C) (Temporal)   Ht 6' (1.829 m)   Wt 207 lb (93.9 kg)   SpO2 94%   BMI 28.07 kg/m    Physical Exam Constitutional:      Appearance: Normal appearance.  HENT:     Head: Normocephalic and atraumatic.      Mouth: Mucous membranes are moist.  Eyes:    Conjunctiva/sclera: Conjunctivae normal.     Pupils: Pupils are equal, round, and b/l symmetrical    Cardiovascular:     Rate and Rhythm: Normal rate and Irregular rhythm.     Heart sounds: s1s2  Pulmonary:     Effort: Pulmonary effort is normal.     Breath sounds: Normal breath sounds.   Abdominal:     General: Non distended     Palpations: soft. Non tender   Musculoskeletal:        General: Normal range of motion.  Venous ulcer in the posterior rt calf with fibrino granular exudate, no purulence, no signs of cellulitis, no fluctuance or crepitus  Superficial skin breakdown in the posterior rt ankle, no signs of infection or purulence       Skin:    General: Skin is warm and dry.     Comments:  Neurological:     General: grossly non focal     Mental Status: awake, alert and oriented to person, place, and time.   Psychiatric:        Mood and Affect: Mood normal.   Lab Results Lab Results  Component Value Date   WBC 6.9 08/07/2023   HGB 11.7 (L) 08/07/2023   HCT 35.9 (L) 08/07/2023   MCV 86.3 08/07/2023   PLT 385 08/07/2023    Lab Results  Component Value Date   CREATININE 0.83 08/06/2023   BUN 12 08/06/2023   NA 134 (L) 08/06/2023   K 4.1 08/06/2023   CL 102  08/06/2023   CO2 26 08/06/2023    Lab Results  Component Value Date   ALT 37 08/04/2023   AST 34  08/04/2023   ALKPHOS 96 08/04/2023   BILITOT 0.8 08/04/2023    Lab Results  Component Value Date   CHOL 59 07/21/2023   HDL 18 (L) 07/21/2023   LDLCALC 27 07/21/2023   TRIG 68 07/21/2023   CHOLHDL 3.3 07/21/2023   No results found for: LABRPR, RPRTITER No results found for: HIV1RNAQUANT, HIV1RNAVL, CD4TABS  Results for orders placed or performed during the hospital encounter of 07/27/23  Respiratory (~20 pathogens) panel by PCR     Status: None   Collection Time: 08/03/23 12:33 PM   Specimen: Nasopharyngeal Swab; Respiratory  Result Value Ref Range Status   Adenovirus NOT DETECTED NOT DETECTED Final   Coronavirus 229E NOT DETECTED NOT DETECTED Final    Comment: (NOTE) The Coronavirus on the Respiratory Panel, DOES NOT test for the novel  Coronavirus (2019 nCoV)    Coronavirus HKU1 NOT DETECTED NOT DETECTED Final   Coronavirus NL63 NOT DETECTED NOT DETECTED Final   Coronavirus OC43 NOT DETECTED NOT DETECTED Final   Metapneumovirus NOT DETECTED NOT DETECTED Final   Rhinovirus / Enterovirus NOT DETECTED NOT DETECTED Final   Influenza A NOT DETECTED NOT DETECTED Final   Influenza B NOT DETECTED NOT DETECTED Final   Parainfluenza Virus 1 NOT DETECTED NOT DETECTED Final   Parainfluenza Virus 2 NOT DETECTED NOT DETECTED Final   Parainfluenza Virus 3 NOT DETECTED NOT DETECTED Final   Parainfluenza Virus 4 NOT DETECTED NOT DETECTED Final   Respiratory Syncytial Virus NOT DETECTED NOT DETECTED Final   Bordetella pertussis NOT DETECTED NOT DETECTED Final   Bordetella Parapertussis NOT DETECTED NOT DETECTED Final   Chlamydophila pneumoniae NOT DETECTED NOT DETECTED Final   Mycoplasma pneumoniae NOT DETECTED NOT DETECTED Final    Comment: Performed at [facility], [address], [place], [place] [address]   Imaging DG Knee 1-2 Views Left Result Date: 08/05/2023 CLINICAL DATA:  Left knee pain with history of cellulitis EXAM: LEFT KNEE - 1-2 VIEW COMPARISON:  None  Available. FINDINGS: No evidence of fracture, dislocation, or joint effusion. There is mild medial compartment joint space narrowing and osteophyte formation. There is medial and lateral compartment chondrocalcinosis. Peripheral vascular calcifications are present. Soft tissues are otherwise within normal limits. IMPRESSION: 1. No acute fracture or dislocation. 2. Mild medial compartment osteoarthritis. 3. Chondrocalcinosis. Electronically Signed   By: Tyron Gallon M.D.   On: 08/05/2023 16:20   DG HIP UNILAT WITH PELVIS 1V LEFT Result Date: 08/03/2023 CLINICAL DATA:  Left hip pain EXAM: DG HIP (WITH OR WITHOUT PELVIS) 3V COMPARISON:  None Available. FINDINGS: Pelvic ring is intact. Degenerative changes of the hip joints are noted bilaterally. No acute fracture or dislocation is noted. Mild soft tissue prominence is noted laterally which may be related to subcutaneous edema possibly from recent fall. Correlate with clinical history. IMPRESSION: No acute fracture noted. Mild soft tissue swelling laterally. Electronically Signed   By: Violeta Grey M.D.   On: 08/03/2023 20:19   DG Chest 2 View Result Date: 08/03/2023 CLINICAL DATA:  Tachypnea EXAM: CHEST - 2 VIEW COMPARISON:  07/15/2023 FINDINGS: Cardiac shadow is enlarged but stable. Aortic calcifications are noted. The lungs are well aerated bilaterally. No focal infiltrate or effusion is noted. No acute bony abnormality is seen. IMPRESSION: No active cardiopulmonary disease. Electronically Signed   By: Violeta Grey M.D.   On:  08/03/2023 20:18   Assessment/Plan # Chronic b/l lower extremity venous wounds  - no active signs of infection, can complete remaining course of PO doxycycline   - continue following Vascular as instructed  - continue fu with Orthopedics for wound care  - discussed symptoms and signs of infection like increased redness, swelling, warmth, tenderness or purulent drainage, signs of sepsis like fevers, chills, malaise and to seek  attention  - Keep legs elevated  - fu as needed/worsening symptoms   I spent 38 minutes involved in face-to-face and non-face-to-face activities for this patient on the day of the visit. Professional time spent includes the following activities: Preparing to see the patient (review of tests), Obtaining and  reviewing separately obtained history (admission/discharge record 5/27), Performing a medically appropriate examination and evaluation, Documenting clinical information in the EMR, Independently interpreting results (not separately reported), Communicating results to the patient/family member, Counseling and educating the patient/family member and Care coordination (not separately reported).   Of note, portions of this note may have been created with voice recognition software. While this note has been edited for accuracy, occasional wrong-word or 'sound-a-like' substitutions may have occurred due to the inherent limitations of voice recognition software.   Melvina Stage, MD [facility] 08/24/2023, 1:01 PM

## 2023-08-25 DIAGNOSIS — L97811 Non-pressure chronic ulcer of other part of right lower leg limited to breakdown of skin: Secondary | ICD-10-CM | POA: Diagnosis not present

## 2023-08-25 DIAGNOSIS — Z4800 Encounter for change or removal of nonsurgical wound dressing: Secondary | ICD-10-CM | POA: Diagnosis not present

## 2023-08-25 DIAGNOSIS — I482 Chronic atrial fibrillation, unspecified: Secondary | ICD-10-CM | POA: Diagnosis not present

## 2023-08-25 DIAGNOSIS — E1151 Type 2 diabetes mellitus with diabetic peripheral angiopathy without gangrene: Secondary | ICD-10-CM | POA: Diagnosis not present

## 2023-08-25 DIAGNOSIS — I872 Venous insufficiency (chronic) (peripheral): Secondary | ICD-10-CM | POA: Diagnosis not present

## 2023-08-25 DIAGNOSIS — L97511 Non-pressure chronic ulcer of other part of right foot limited to breakdown of skin: Secondary | ICD-10-CM | POA: Diagnosis not present

## 2023-08-26 DIAGNOSIS — L03116 Cellulitis of left lower limb: Secondary | ICD-10-CM | POA: Diagnosis not present

## 2023-08-28 DIAGNOSIS — Z79899 Other long term (current) drug therapy: Secondary | ICD-10-CM | POA: Insufficient documentation

## 2023-08-31 DIAGNOSIS — L03116 Cellulitis of left lower limb: Secondary | ICD-10-CM | POA: Diagnosis not present

## 2023-09-01 ENCOUNTER — Ambulatory Visit (INDEPENDENT_AMBULATORY_CARE_PROVIDER_SITE_OTHER): Admitting: Family

## 2023-09-01 ENCOUNTER — Encounter: Payer: Self-pay | Admitting: Family

## 2023-09-01 DIAGNOSIS — L97521 Non-pressure chronic ulcer of other part of left foot limited to breakdown of skin: Secondary | ICD-10-CM

## 2023-09-01 DIAGNOSIS — I739 Peripheral vascular disease, unspecified: Secondary | ICD-10-CM

## 2023-09-01 DIAGNOSIS — I87331 Chronic venous hypertension (idiopathic) with ulcer and inflammation of right lower extremity: Secondary | ICD-10-CM

## 2023-09-01 NOTE — Progress Notes (Signed)
 Patient ID: Logan French, male   DOB: 1934/09/24, 89 y.o.   MRN: [id number]  Reason for Consult: Routine Post Op   Referred by [name], MD  Subjective:     HPI Logan French is a 89 y.o. male with PAD presenting for follow-up.  He recently underwent left lower extremity angiogram with shockwave lithotripsy of the left popliteal artery on Jul 20, 2023 with Dr. Charlotte Cookey due to foot wounds. He also underwent shockwave lithotripsy of the right popliteal artery and distal SFA with drug-coated balloon angioplasty of the segments in March for slow healing wounds.  Today he reports his posterior ankle wounds have been healing.  He denies any foot pain and reports that his swelling has improved as well and was able to get his normal shoes on.  He has had some improvement in his walking as well.  Past Medical History:  Diagnosis Date   A-fib (HCC)    B12 deficiency    Diverticulosis    Fatigue    History of colonic polyps    Hyperlipemia    Hypertension    Peripheral vascular disease (HCC)    Pernicious anemia    Prostate cancer (HCC)    Syncope    Syncope    History reviewed. No pertinent family history. Past Surgical History:  Procedure Laterality Date   ABDOMINAL AORTOGRAM N/A 07/20/2023   Procedure: ABDOMINAL AORTOGRAM;  Surgeon: Margherita Shell, MD;  Location: [facility];  Service: Cardiovascular;  Laterality: N/A;   ABDOMINAL AORTOGRAM W/LOWER EXTREMITY N/A 05/17/2023   Procedure: ABDOMINAL AORTOGRAM W/LOWER EXTREMITY;  Surgeon: Philipp Brawn, MD;  Location: [facility] INVASIVE CV LAB;  Service: Cardiovascular;  Laterality: N/A;   CARDIOVERSION N/A 10/07/2021   Procedure: CARDIOVERSION;  Surgeon: Jerryl Morin, DO;  Location: [facility]OS[facility];  Service: Cardiovascular;  Laterality: N/A;   IR ANGIO INTRA EXTRACRAN SEL COM CAROTID INNOMINATE BILAT MOD SED  09/10/2017   IR ANGIO VERTEBRAL SEL SUBCLAVIAN INNOMINATE BILAT MOD SED  09/10/2017   LOWER EXTREMITY ANGIOGRAPHY  N/A 07/20/2023   Procedure: Lower Extremity Angiography;  Surgeon: Margherita Shell, MD;  Location: [facility];  Service: Cardiovascular;  Laterality: N/A;   LOWER EXTREMITY INTERVENTION Left 07/20/2023   Procedure: LOWER EXTREMITY INTERVENTION;  Surgeon: Margherita Shell, MD;  Location: [facility];  Service: Cardiovascular;  Laterality: Left;   PERIPHERAL INTRAVASCULAR LITHOTRIPSY Right 05/17/2023   Procedure: PERIPHERAL INTRAVASCULAR LITHOTRIPSY;  Surgeon: Philipp Brawn, MD;  Location: [facility] INVASIVE CV LAB;  Service: Cardiovascular;  Laterality: Right;  SFA-POP   PERIPHERAL INTRAVASCULAR LITHOTRIPSY Left 07/20/2023   Procedure: PERIPHERAL INTRAVASCULAR LITHOTRIPSY;  Surgeon: Margherita Shell, MD;  Location: [facility];  Service: Cardiovascular;  Laterality: Left;   PERIPHERAL VASCULAR BALLOON ANGIOPLASTY Right 05/17/2023   Procedure: PERIPHERAL VASCULAR BALLOON ANGIOPLASTY;  Surgeon: Philipp Brawn, MD;  Location: [facility];  Service: Cardiovascular;  Laterality: Right;  SFA-POP    Short Social History:  Social History   Tobacco Use   Smoking status: Never   Smokeless tobacco: Never   Tobacco comments:    Never smoke 09/23/21  Substance Use Topics   Alcohol  use: Yes    Alcohol /week: 2.0 standard drinks of alcohol     Types: 1 Cans of beer, 1 Shots of liquor per week    Comment: occasionally    No Known Allergies  Current Outpatient Medications  Medication Sig Dispense Refill   acetaminophen  (TYLENOL ) 325  MG tablet Take 2 tablets (650 mg total) by mouth every 4 (four) hours as needed for headache or mild pain (pain score 1-3).     clopidogrel  (PLAVIX ) 75 MG tablet Take 1 tablet (75 mg total) by mouth daily. 30 tablet 0   colchicine  0.6 MG tablet Take 1 tablet (0.6 mg total) by mouth daily. 30 tablet 0   collagenase  (SANTYL ) 250 UNIT/GM ointment Apply topically daily. Apply to affected area as directed 30 g 0   cyanocobalamin  (VITAMIN B12) 1000 MCG tablet  Take 0.5 tablets (500 mcg total) by mouth daily. 15 tablet 0   diclofenac  Sodium (VOLTAREN ) 1 % GEL Apply 2 g topically 4 (four) times daily. 100 g 0   doxycycline  (VIBRA -TABS) 100 MG tablet Take 1 tablet (100 mg total) by mouth every 12 (twelve) hours. 6 tablet 0   ezetimibe  (ZETIA ) 10 MG tablet Take 1 tablet (10 mg total) by mouth daily. 30 tablet 0   finasteride  (PROSCAR ) 5 MG tablet Take 1 tablet (5 mg total) by mouth daily. 30 tablet 0   hydrochlorothiazide  (HYDRODIURIL ) 12.5 MG tablet Take 1 tablet (12.5 mg total) by mouth daily. 30 tablet 0   metFORMIN  (GLUCOPHAGE ) 500 MG tablet Take 1 tablet (500 mg total) by mouth daily with breakfast. 30 tablet 0   metoprolol  succinate (TOPROL -XL) 25 MG 24 hr tablet Take 0.5 tablets (12.5 mg total) by mouth every 12 (twelve) hours. 30 tablet 0   oxyCODONE  (OXY IR/ROXICODONE ) 5 MG immediate release tablet Take 1 tablet (5 mg total) by mouth every 6 (six) hours as needed for severe pain (pain score 7-10). 30 tablet 0   pantoprazole  (PROTONIX ) 40 MG tablet Take 1 tablet (40 mg total) by mouth daily. 30 tablet 0   rosuvastatin  (CRESTOR ) 20 MG tablet Take 1 tablet (20 mg total) by mouth daily. 30 tablet 0   STUDY - LIBREXIA-AF - apixaban  5 mg or placebo capsule (PI-Sethi) Take 1 capsule (5 mg total) by mouth 2 (two) times daily. Take at approximately the same time of day with or without food. Please bring bottle back with you to every visit; do not discard bottle. For Investigational Use Only. 280 capsule 0   STUDY - LIBREXIA-AF - [id number] (Milvexian) 100 mg or placebo tablet (PI-Sethi) Take 1 tablet by mouth 2 (two) times daily. Take at approximately the same time of day with or without food. Please bring bottle back with you to every visit; do not discard bottle. For Investigational Use Only. 280 tablet 0   Triamcinolone  Acetonide (TRIAMCINOLONE  0.1 % CREAM : EUCERIN) CREA Apply 1 Application topically 2 (two) times daily. 1 each 0   vitamin D3  (CHOLECALCIFEROL ) 25 MCG tablet Take 1 tablet (1,000 Units total) by mouth daily. 30 tablet 0   No current facility-administered medications for this visit.    REVIEW OF SYSTEMS  All other systems were reviewed and are negative     Objective:  Objective   Vitals:   09/03/23 1147  BP: (!) 156/87  Pulse: 65  Temp: 98.2 F (36.8 C)  TempSrc: Temporal  SpO2: 95%  Weight: 206 lb 9.6 oz (93.7 kg)  Height: 6' (1.829 m)   Body mass index is 28.02 kg/m.  Physical Exam General: no acute distress Cardiac: hemodynamically stable Pulm: normal work of breathing Neuro: alert, no focal deficit Extremities: Mild 1+ edema from left foot to mid shin, bilateral posterior calf wounds present.  On the right is almost completely healed and is epithelialized.  On the  left there is some granulation tissue and some fibrinous buildup. Vascular:   Right: Palpable DP  Left: Palpable DP  Data: ABI +---------+------------------+-----+---------+--------+  Right   Rt Pressure (mmHg)IndexWaveform Comment   +---------+------------------+-----+---------+--------+  Brachial 129                    biphasic           +---------+------------------+-----+---------+--------+  ATA     135               1.05 triphasic          +---------+------------------+-----+---------+--------+  PTA     127               0.98 biphasic           +---------+------------------+-----+---------+--------+  Great Toe126               0.98 Normal             +---------+------------------+-----+---------+--------+   +---------+------------------+-----+--------+-------+  Left    Lt Pressure (mmHg)IndexWaveformComment  +---------+------------------+-----+--------+-------+  Brachial 118                    biphasic         +---------+------------------+-----+--------+-------+  ATA     141               1.09 biphasic         +---------+------------------+-----+--------+-------+   PTA     111               0.86 biphasic         +---------+------------------+-----+--------+-------+  Great Toe99                0.77 Abnormal         +---------+------------------+-----+--------+-------+   +-------+-----------+-----------+------------+------------+  ABI/TBIToday's ABIToday's TBIPrevious ABIPrevious TBI  +-------+-----------+-----------+------------+------------+  Right 1.05       0.98       not found   0.62          +-------+-----------+-----------+------------+------------+  Left  1.09       0.77       0.98        0.35          +-------+-----------+-----------+------------+------------+    Duplex +-----------+--------+-----+---------------+----------+--------+  RIGHT     PSV cm/sRatioStenosis       Waveform  Comments  +-----------+--------+-----+---------------+----------+--------+  CFA Distal 69                          triphasic           +-----------+--------+-----+---------------+----------+--------+  DFA       82                          biphasic            +-----------+--------+-----+---------------+----------+--------+  SFA Prox   82                          triphasic           +-----------+--------+-----+---------------+----------+--------+  SFA Mid    129                         triphasic           +-----------+--------+-----+---------------+----------+--------+  SFA Distal 176          30-49% stenosistriphasic           +-----------+--------+-----+---------------+----------+--------+  POP Prox   92                          triphasic           +-----------+--------+-----+---------------+----------+--------+  POP Distal 94                          triphasic           +-----------+--------+-----+---------------+----------+--------+  TP Trunk   103                         triphasic           +-----------+--------+-----+---------------+----------+--------+  ATA  Distal 18                                              +-----------+--------+-----+---------------+----------+--------+  PTA Distal 26                          monophasic          +-----------+--------+-----+---------------+----------+--------+  PERO Distal86                          biphasic            +-----------+--------+-----+---------------+----------+--------+   +-----------+--------+-----+--------+----------+--------+  LEFT      PSV cm/sRatioStenosisWaveform  Comments  +-----------+--------+-----+--------+----------+--------+  CFA Distal 116                  biphasic            +-----------+--------+-----+--------+----------+--------+  DFA       52                   biphasic            +-----------+--------+-----+--------+----------+--------+  SFA Prox   101                  biphasic            +-----------+--------+-----+--------+----------+--------+  SFA Mid    118                  biphasic            +-----------+--------+-----+--------+----------+--------+  SFA Distal 106                  biphasic            +-----------+--------+-----+--------+----------+--------+  POP Prox   92                   biphasic            +-----------+--------+-----+--------+----------+--------+  POP Distal 57                   biphasic            +-----------+--------+-----+--------+----------+--------+  TP Trunk   90                   biphasic            +-----------+--------+-----+--------+----------+--------+  ATA Distal 133                  biphasic            +-----------+--------+-----+--------+----------+--------+  PTA Distal  26                   monophasic          +-----------+--------+-----+--------+----------+--------+  PERO Distal             occluded                    +-----------+--------+-----+--------+----------+--------+      Assessment/Plan:   Makenzie Vittorio is a 89  y.o. male with PAD who has undergone bilateral interventions with popliteal intravascular lithotripsy and drug-coated balloon angioplasty. His wounds are healing.  He is going to the beach in a few weeks I just explained to keep them protected with gauze and either a wrap or socks.  Instructed to not go in the ocean with open wounds.  Continue aspirin , Plavix , statin Will plan to follow-up in 3 months with repeat ABI and bilateral duplex.    Philipp Brawn MD [facility]

## 2023-09-01 NOTE — Progress Notes (Signed)
 Office Visit Note   Patient: Logan French           Date of Birth: 06/15/1934           MRN: [id number] Visit Date: 09/01/2023              Requested by: Yolande Toribio MATSU, MD [address] [place],  [place] [address] PCP: Yolande Toribio MATSU, MD  Chief Complaint  Patient presents with   Left Leg - Follow-up   Right Leg - Follow-up      HPI: The patient is an 89 year old gentleman seen in follow-up for bilateral lower extremity.  Venous ulcers to bilateral lower extremities with chronic venous insufficiency.  He prefers to let his ulcers get air especially at night.  He also has been using petrolatum gauze to the right posterior Achilles area ulcer and petrolatum gauze with Santyl  applied to the left calf ulcer  Per ID note was prescribed doxycycline  on June 5.  Completed course on the 15th  Due to to see vascular later this week  Assessment & Plan: Visit Diagnoses: No diagnosis found.  Plan: Recommended they stop using the petrolatum gauze due to maceration.  On the right posterior calf may use Allevyn foam dressings or dry dressings.  To the left posterior calf they may continue with Santyl  packing this open have offered a different base layer that is not adherent without added moisture they will trial this over the next 1 week.  Discussed the importance of elevation and compression  Follow-Up Instructions: No follow-ups on file.   Ortho Exam  Patient is alert, oriented, no adenopathy, well-dressed, normal affect, normal respiratory effort.  Pitting edema to bilateral lower extremities left worse right.  There is weeping on the left.  No weeping on the right On examination right lower extremity over the posterior Achilles area there is ulceration and maceration.  Today ulcer measures 2 cm x 5 mm there is surrounding maceration no active drainage there is thin fibrinous exudative tissue there is no sign of infection.  The patient declined debridement. On examination  left lower extremity weeping edema with pinpoint open areas to the anterior calf present.  Some new some healing.  There is mild erythema without cellulitis to the posterior calf is chronic ulcers present this is 3 and half centimeters by 2 and half centimeters with 1 cm of depth this is debrided of nonviable tissue there is necrotic tissue around the wound edges increased granulation centrally.  Ulcer improved from last viewing   Imaging: No results found. No images are attached to the encounter.  Labs: Lab Results  Component Value Date   HGBA1C 6.4 (H) 06/17/2023   CRP 26.9 (H) 06/23/2023   CRP 27.5 (H) 06/22/2023   CRP 27.6 (H) 06/21/2023   LABURIC 5.0 06/22/2023   LABURIC 5.3 06/21/2023   REPTSTATUS 07/20/2023 FINAL 07/15/2023   GRAMSTAIN Rare 10/29/2015   GRAMSTAIN WBC present-predominately Mononuclear 10/29/2015   GRAMSTAIN No Squamous Epithelial Cells Seen 10/29/2015   GRAMSTAIN Moderate Gram Positive Cocci In Pairs In Clusters 10/29/2015   CULT  07/15/2023    NO GROWTH 5 DAYS Performed at [facility], [address], [place], [place] [address]    LABORGA GROUP A STREP (S.PYOGENES) ISOLATED 06/17/2023     Lab Results  Component Value Date   ALBUMIN 2.0 (L) 08/04/2023   ALBUMIN 2.2 (L) 08/02/2023   ALBUMIN 2.1 (L) 07/28/2023    Lab Results  Component Value Date  MG 2.0 06/23/2023   MG 2.0 06/22/2023   MG 2.0 06/21/2023   Lab Results  Component Value Date   VD25OH 27.89 (L) 06/26/2023    No results found for: PREALBUMIN    Latest Ref Rng & Units 08/07/2023    7:25 AM 08/06/2023    6:43 AM 08/05/2023   10:46 AM  CBC EXTENDED  WBC 4.0 - 10.5 K/uL 6.9  7.7  11.8   RBC 4.22 - 5.81 MIL/uL 4.16  3.92  4.40   Hemoglobin 13.0 - 17.0 g/dL 88.2  89.0  87.7   HCT 39.0 - 52.0 % 35.9  33.7  37.8   Platelets 150 - 400 K/uL 385  368  458   NEUT# 1.7 - 7.7 K/uL 4.5  5.1  9.7   Lymph# 0.7 - 4.0 K/uL 1.5  1.6  1.0      There is no height or weight on file to  calculate BMI.  Orders:  No orders of the defined types were placed in this encounter.  No orders of the defined types were placed in this encounter.    Procedures: No procedures performed  Clinical Data: No additional findings.  ROS:  All other systems negative, except as noted in the HPI. Review of Systems  Objective: Vital Signs: There were no vitals taken for this visit.  Specialty Comments:  No specialty comments available.  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) 06/20/2023   Streptococcal bacteremia 06/18/2023   Cellulitis of right lower extremity 06/17/2023   Persistent atrial fibrillation (HCC) 09/23/2021   Hypercoagulable state due to persistent atrial fibrillation (HCC) 09/23/2021   Callus 10/25/2019   Corns and callosities 10/25/2019   Hav (hallux abducto valgus), unspecified laterality 10/25/2019   Altered awareness, transient 06/23/2017   MALLET FINGER, RIGHT RING FINGER 10/19/2008   Past Medical History:  Diagnosis Date   A-fib (HCC)    B12 deficiency    Diverticulosis    Fatigue    History of colonic polyps    Hyperlipemia    Hypertension    Pernicious anemia    Prostate cancer (HCC)    Syncope    Syncope     History reviewed. No pertinent family history.  Past Surgical History:  Procedure Laterality Date   ABDOMINAL AORTOGRAM N/A 07/20/2023   Procedure: ABDOMINAL AORTOGRAM;  Surgeon: Serene Gaile ORN, MD;  Location:  [facility];  Service: Cardiovascular;  Laterality: N/A;   ABDOMINAL AORTOGRAM W/LOWER EXTREMITY N/A 05/17/2023   Procedure: ABDOMINAL AORTOGRAM W/LOWER EXTREMITY;  Surgeon: Pearline Norman RAMAN, MD;  Location: [facility] INVASIVE CV LAB;  Service: Cardiovascular;  Laterality: N/A;   CARDIOVERSION N/A 10/07/2021   Procedure: CARDIOVERSION;  Surgeon: Sheena Pugh, DO;  Location: [facility]OS[facility];  Service: Cardiovascular;  Laterality: N/A;   IR ANGIO INTRA EXTRACRAN SEL COM CAROTID INNOMINATE BILAT MOD SED  09/10/2017   IR ANGIO VERTEBRAL SEL SUBCLAVIAN INNOMINATE BILAT MOD SED  09/10/2017   LOWER EXTREMITY ANGIOGRAPHY N/A 07/20/2023   Procedure: Lower Extremity  Angiography;  Surgeon: Serene Gaile ORN, MD;  Location: [facility] INVASIVE CV LAB;  Service: Cardiovascular;  Laterality: N/A;   LOWER EXTREMITY INTERVENTION Left 07/20/2023   Procedure: LOWER EXTREMITY INTERVENTION;  Surgeon: Serene Gaile ORN, MD;  Location: [facility];  Service: Cardiovascular;  Laterality: Left;   PERIPHERAL INTRAVASCULAR LITHOTRIPSY Right 05/17/2023   Procedure: PERIPHERAL INTRAVASCULAR LITHOTRIPSY;  Surgeon: Pearline Norman RAMAN, MD;  Location: [facility] INVASIVE CV LAB;  Service: Cardiovascular;  Laterality: Right;  SFA-POP   PERIPHERAL INTRAVASCULAR LITHOTRIPSY Left 07/20/2023   Procedure: PERIPHERAL INTRAVASCULAR LITHOTRIPSY;  Surgeon: Serene Gaile ORN, MD;  Location: [facility];  Service: Cardiovascular;  Laterality: Left;   PERIPHERAL VASCULAR BALLOON ANGIOPLASTY Right 05/17/2023   Procedure: PERIPHERAL VASCULAR BALLOON ANGIOPLASTY;  Surgeon: Pearline Norman RAMAN, MD;  Location: [facility];  Service: Cardiovascular;  Laterality: Right;  SFA-POP   Social History   Occupational History   Not on file  Tobacco Use   Smoking status: Never   Smokeless tobacco: Never   Tobacco comments:    Never smoke 09/23/21  Vaping Use   Vaping status: Never Used  Substance and Sexual Activity   Alcohol  use: Yes    Alcohol /week: 2.0 standard  drinks of alcohol     Types: 1 Cans of beer, 1 Shots of liquor per week    Comment: occasionally   Drug use: No   Sexual activity: Not on file

## 2023-09-03 ENCOUNTER — Encounter: Payer: Self-pay | Admitting: Vascular Surgery

## 2023-09-03 ENCOUNTER — Ambulatory Visit: Attending: Vascular Surgery | Admitting: Vascular Surgery

## 2023-09-03 ENCOUNTER — Ambulatory Visit (HOSPITAL_COMMUNITY)
Admission: RE | Admit: 2023-09-03 | Discharge: 2023-09-03 | Disposition: A | Discharge status: Home / Self Care | Source: Ambulatory Visit | Attending: Vascular Surgery | Admitting: Vascular Surgery

## 2023-09-03 ENCOUNTER — Ambulatory Visit (HOSPITAL_COMMUNITY)
Admit: 2023-09-03 | Discharge: 2023-09-03 | Disposition: A | Discharge status: Home / Self Care | Attending: Vascular Surgery | Admitting: Vascular Surgery

## 2023-09-03 VITALS — BP 156/87 | HR 65 | Temp 98.2°F | Ht 72.0 in | Wt 206.6 lb

## 2023-09-03 DIAGNOSIS — I70221 Atherosclerosis of native arteries of extremities with rest pain, right leg: Secondary | ICD-10-CM | POA: Diagnosis not present

## 2023-09-03 DIAGNOSIS — L03116 Cellulitis of left lower limb: Secondary | ICD-10-CM | POA: Diagnosis not present

## 2023-09-03 DIAGNOSIS — I739 Peripheral vascular disease, unspecified: Secondary | ICD-10-CM

## 2023-09-03 DIAGNOSIS — I70222 Atherosclerosis of native arteries of extremities with rest pain, left leg: Secondary | ICD-10-CM | POA: Diagnosis not present

## 2023-09-03 LAB — VAS US ABI WITH/WO TBI
Left ABI: 1.09
Right ABI: 1.05

## 2023-09-06 ENCOUNTER — Other Ambulatory Visit: Payer: Self-pay

## 2023-09-06 DIAGNOSIS — I739 Peripheral vascular disease, unspecified: Secondary | ICD-10-CM

## 2023-09-07 DIAGNOSIS — L03116 Cellulitis of left lower limb: Secondary | ICD-10-CM | POA: Diagnosis not present

## 2023-09-08 ENCOUNTER — Encounter: Payer: Self-pay | Admitting: Family

## 2023-09-10 DIAGNOSIS — L03116 Cellulitis of left lower limb: Secondary | ICD-10-CM | POA: Diagnosis not present

## 2023-09-13 ENCOUNTER — Other Ambulatory Visit (HOSPITAL_COMMUNITY): Payer: Self-pay | Admitting: Pharmacist

## 2023-09-13 MED ORDER — STUDY - LIBREXIA-AF - APIXABAN 5 MG OR PLACEBO CAPSULE (PI-SETHI): 5.0000 mg | ORAL_CAPSULE | Freq: Two times a day (BID) | ORAL | 0 refills | Status: DC

## 2023-09-13 MED ORDER — STUDY - LIBREXIA-AF - JNJ-70033093 (MILVEXIAN) 100 MG OR PLACEBO (PI-SETHI): 1.0000 | ORAL_TABLET | Freq: Two times a day (BID) | ORAL | 0 refills | Status: DC

## 2023-09-14 ENCOUNTER — Ambulatory Visit: Admitting: Family

## 2023-09-14 DIAGNOSIS — I87331 Chronic venous hypertension (idiopathic) with ulcer and inflammation of right lower extremity: Secondary | ICD-10-CM | POA: Diagnosis not present

## 2023-09-14 DIAGNOSIS — L97521 Non-pressure chronic ulcer of other part of left foot limited to breakdown of skin: Secondary | ICD-10-CM | POA: Diagnosis not present

## 2023-09-14 DIAGNOSIS — I739 Peripheral vascular disease, unspecified: Secondary | ICD-10-CM | POA: Diagnosis not present

## 2023-09-17 DIAGNOSIS — L97511 Non-pressure chronic ulcer of other part of right foot limited to breakdown of skin: Secondary | ICD-10-CM | POA: Diagnosis not present

## 2023-09-17 DIAGNOSIS — N39498 Other specified urinary incontinence: Secondary | ICD-10-CM | POA: Diagnosis not present

## 2023-09-17 DIAGNOSIS — N401 Enlarged prostate with lower urinary tract symptoms: Secondary | ICD-10-CM | POA: Diagnosis not present

## 2023-09-17 DIAGNOSIS — L97811 Non-pressure chronic ulcer of other part of right lower leg limited to breakdown of skin: Secondary | ICD-10-CM | POA: Diagnosis not present

## 2023-09-17 DIAGNOSIS — D6869 Other thrombophilia: Secondary | ICD-10-CM | POA: Diagnosis not present

## 2023-09-17 DIAGNOSIS — E785 Hyperlipidemia, unspecified: Secondary | ICD-10-CM | POA: Diagnosis not present

## 2023-09-17 DIAGNOSIS — I10 Essential (primary) hypertension: Secondary | ICD-10-CM | POA: Diagnosis not present

## 2023-09-17 DIAGNOSIS — K5790 Diverticulosis of intestine, part unspecified, without perforation or abscess without bleeding: Secondary | ICD-10-CM | POA: Diagnosis not present

## 2023-09-17 DIAGNOSIS — E1151 Type 2 diabetes mellitus with diabetic peripheral angiopathy without gangrene: Secondary | ICD-10-CM | POA: Diagnosis not present

## 2023-09-17 DIAGNOSIS — I482 Chronic atrial fibrillation, unspecified: Secondary | ICD-10-CM | POA: Diagnosis not present

## 2023-09-17 DIAGNOSIS — F1090 Alcohol use, unspecified, uncomplicated: Secondary | ICD-10-CM | POA: Diagnosis not present

## 2023-09-17 DIAGNOSIS — M109 Gout, unspecified: Secondary | ICD-10-CM | POA: Diagnosis not present

## 2023-09-22 ENCOUNTER — Encounter: Payer: Self-pay | Admitting: Family

## 2023-09-22 NOTE — Progress Notes (Signed)
 Office Visit Note   Patient: Logan French           Date of Birth: 1934/06/09           MRN: [id number] Visit Date: 09/14/2023              Requested by: Yolande Toribio MATSU, MD [address] [place],  [place] [address] PCP: Yolande Toribio MATSU, MD  Chief Complaint  Patient presents with   Right Leg - Follow-up   Left Leg - Follow-up      HPI: The patient is an 89 year old gentleman we have been following for bilateral lower extremity edema with venous ulcerations  Has been difficult for him to wear compression he does not tolerate this well  Did complete a course doxycycline  June 5 to 15 has seen vascular surgery.  He has previously had popliteal intravascular lithotripsy and drug-coated balloon angioplasty.  He is on aspirin  Plavix  as well as a statin they plan to follow-up in September with repeat ABIs and duplex  Assessment & Plan: Visit Diagnoses:  1. Chronic venous hypertension (idiopathic) with ulcer and inflammation of right lower extremity (HCC)   2. PAD (peripheral artery disease) (HCC)   3. Non-pressure chronic ulcer of other part of left foot limited to breakdown of skin (HCC)     Plan: They will continue using Santyl  on the area of fibrinous exudative tissue after daily to also cleansing of all wounds.  Recommended packing open with Vashe soaked gauze did recommend compression whether that is stockings or using Ace elevating around the clock.  Will follow-up in the office when they return from their trip.  Follow-Up Instructions: Return in about 2 weeks (around 09/28/2023).   Ortho Exam  Patient is alert, oriented, no adenopathy, well-dressed, normal affect, normal respiratory effort. On examination left lower extremity there is weeping edema present some scattered anterior pinpoint open ulcerations without any sign of any infection.  Posteriorly the calf wound is present 3 and half centimeters by 2 and half centimeters with 5 mm of depth this is debrided of  nonviable tissue with Debrisoft.  As well as Vashe.  The posterior Achilles area ulcer is superficial there is no surrounding erythema or warmth no signs of infection    Imaging: No results found.   Labs: Lab Results  Component Value Date   HGBA1C 6.4 (H) 06/17/2023   CRP 26.9 (H) 06/23/2023   CRP 27.5 (H) 06/22/2023   CRP 27.6 (H) 06/21/2023   LABURIC 5.0 06/22/2023   LABURIC 5.3 06/21/2023   REPTSTATUS 07/20/2023 FINAL 07/15/2023   GRAMSTAIN Rare 10/29/2015   GRAMSTAIN WBC present-predominately Mononuclear 10/29/2015   GRAMSTAIN No Squamous Epithelial Cells Seen 10/29/2015   GRAMSTAIN Moderate Gram Positive Cocci In Pairs In Clusters 10/29/2015   CULT  07/15/2023    NO GROWTH 5 DAYS Performed at [facility], [address] [place], [place] [address]    LABORGA GROUP A STREP (S.PYOGENES) ISOLATED 06/17/2023     Lab Results  Component Value Date   ALBUMIN 2.0 (L) 08/04/2023   ALBUMIN 2.2 (L) 08/02/2023   ALBUMIN 2.1 (L) 07/28/2023    Lab Results  Component Value Date   MG 2.0 06/23/2023   MG 2.0 06/22/2023   MG 2.0 06/21/2023   Lab Results  Component Value Date   VD25OH 27.89 (L) 06/26/2023    No results found for: PREALBUMIN    Latest Ref Rng & Units 08/07/2023    7:25 AM 08/06/2023  6:43 AM 08/05/2023   10:46 AM  CBC EXTENDED  WBC 4.0 - 10.5 K/uL 6.9  7.7  11.8   RBC 4.22 - 5.81 MIL/uL 4.16  3.92  4.40   Hemoglobin 13.0 - 17.0 g/dL 88.2  89.0  87.7   HCT 39.0 - 52.0 % 35.9  33.7  37.8   Platelets 150 - 400 K/uL 385  368  458   NEUT# 1.7 - 7.7 K/uL 4.5  5.1  9.7   Lymph# 0.7 - 4.0 K/uL 1.5  1.6  1.0      There is no height or weight on file to calculate BMI.  Orders:  No orders of the defined types were placed in this encounter.  No orders of the defined types were placed in this encounter.    Procedures: No procedures performed  Clinical Data: No additional findings.  ROS:  All other systems negative, except as noted in the  HPI. Review of Systems  Objective: Vital Signs: There were no vitals taken for this visit.  Specialty Comments:  No specialty comments available.  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) 06/20/2023   Streptococcal bacteremia 06/18/2023   Cellulitis of right lower extremity 06/17/2023   Persistent atrial fibrillation (HCC) 09/23/2021   Hypercoagulable state due to persistent atrial fibrillation (HCC) 09/23/2021   Callus 10/25/2019   Corns and callosities 10/25/2019   Hav (hallux abducto valgus), unspecified laterality 10/25/2019   Altered awareness, transient 06/23/2017   MALLET FINGER, RIGHT RING FINGER 10/19/2008   Past Medical History:  Diagnosis Date   A-fib (HCC)    B12 deficiency    Diverticulosis    Fatigue    History of colonic polyps    Hyperlipemia    Hypertension    Peripheral vascular disease (HCC)    Pernicious anemia    Prostate cancer (HCC)    Syncope    Syncope     History reviewed. No pertinent family history.  Past Surgical History:  Procedure Laterality Date   ABDOMINAL AORTOGRAM N/A 07/20/2023   Procedure: ABDOMINAL AORTOGRAM;  Surgeon: Serene Gaile ORN, MD;  Location: [facility];  Service: Cardiovascular;  Laterality: N/A;   ABDOMINAL AORTOGRAM W/LOWER EXTREMITY N/A 05/17/2023   Procedure: ABDOMINAL AORTOGRAM W/LOWER EXTREMITY;  Surgeon: Pearline Norman RAMAN, MD;  Location: [facility] CV [facility];  Service: Cardiovascular;   Laterality: N/A;   CARDIOVERSION N/A 10/07/2021   Procedure: CARDIOVERSION;  Surgeon: Sheena Pugh, DO;  Location: [facility] ENDOSCOPY;  Service: Cardiovascular;  Laterality: N/A;   IR ANGIO INTRA EXTRACRAN SEL COM CAROTID INNOMINATE BILAT MOD SED  09/10/2017   IR ANGIO VERTEBRAL SEL SUBCLAVIAN INNOMINATE BILAT MOD SED  09/10/2017   LOWER EXTREMITY ANGIOGRAPHY N/A 07/20/2023   Procedure: Lower Extremity Angiography;  Surgeon: Serene Gaile ORN, MD;  Location: [facility];  Service: Cardiovascular;  Laterality: N/A;   LOWER EXTREMITY INTERVENTION Left 07/20/2023   Procedure: LOWER EXTREMITY INTERVENTION;  Surgeon: Serene Gaile ORN, MD;  Location: [facility];  Service:  Cardiovascular;  Laterality: Left;   PERIPHERAL INTRAVASCULAR LITHOTRIPSY Right 05/17/2023   Procedure: PERIPHERAL INTRAVASCULAR LITHOTRIPSY;  Surgeon: Pearline Norman RAMAN, MD;  Location: [facility] INVASIVE CV LAB;  Service: Cardiovascular;  Laterality: Right;  SFA-POP   PERIPHERAL INTRAVASCULAR LITHOTRIPSY Left 07/20/2023   Procedure: PERIPHERAL INTRAVASCULAR LITHOTRIPSY;  Surgeon: Serene Gaile ORN, MD;  Location: [facility];  Service: Cardiovascular;  Laterality: Left;   PERIPHERAL VASCULAR BALLOON ANGIOPLASTY Right 05/17/2023   Procedure: PERIPHERAL VASCULAR BALLOON ANGIOPLASTY;  Surgeon: Pearline Norman RAMAN, MD;  Location: [facility];  Service: Cardiovascular;  Laterality: Right;  SFA-POP   Social History   Occupational History   Not on file  Tobacco Use   Smoking status: Never   Smokeless tobacco: Never   Tobacco comments:    Never smoke 09/23/21  Vaping Use   Vaping status: Never Used  Substance and Sexual Activity   Alcohol  use: Yes    Alcohol /week: 2.0 standard drinks of alcohol     Types: 1 Cans of beer, 1 Shots of liquor per week    Comment: occasionally   Drug use: No   Sexual activity: Not on file

## 2023-09-28 NOTE — Progress Notes (Signed)
 Babs Arthea DASEN, MD  Physician Physical Medicine and Rehabilitation   PMR Pre-admission    Signed   Date of Service: 07/27/2023 10:47 AM  Related encounter: ED to Hosp-Admission (Discharged) from 07/15/2023 in [facility]   Signed     Expand All Collapse All  Show:Clear all [x] Written[x] Templated[x] Copied  Added by: [x] Alison, Heron MATSU, RN[x] Babs Arthea DASEN, MD  [] Hover for details PMR Admission Coordinator Pre-Admission Assessment   Patient: Logan French is an 89 y.o., male MRN: [id number] DOB: [birth year] Height: 6' (182.9 cm) Weight: 101.6 kg   Insurance Information HMO:     PPO:      PCP:      IPA:      80/20:      OTHER:  PRIMARY: Medicare Part A and B      Policy#: [id number]      Subscriber: pt Benefits:  Phone #: passport one source     Name: 5/7 Eff. Date: a 09/14/2003 and b 08/15/2011     Deduct: $1676      Out of Pocket Max: none      Life Max: none CIR: 100%      SNF: 20 full days Outpatient: 80%     Co-Pay: 20% Home Health: 100%      Co-Pay: none DME: 80%     Co-Pay: 20% Providers: pt choice  SECONDARY: BCBS supplement      Policy#: [id number]   Financial Counselor:       Phone#:    The "Data Collection Information Summary" for patients in Inpatient Rehabilitation Facilities with attached "Privacy Act Statement-Health Care Records" was provided and verbally reviewed with: Patient   Emergency Contact Information Contact Information       Name Relation Home Work Mobile    Horton Son     [phone]    Melba Inocente RAMAN Spouse [phone]        MELBA GLENDIA Blades     [phone]         Other Contacts   None on File      Current Medical History  Patient Admitting Diagnosis: sepsis, debility   History of Present Illness: Logan French is an 89 year old right-handed male with history of atrial fibrillation and cardioversion 10/07/2021 per Dr. Sheena Pugh, hyperlipidemia, syncope, hypertension, pernicious  anemia, diabetes mellitus, prostate cancer, peripheral vascular disease status post angioplasty of the right popliteal artery 05/17/2023 per Dr. Pearline.  Patient did receive CIR 06/25/2023 - 07/07/2023 due to sepsis secondary to right lower extremity cellulitis/streptococcal bacteremia and was discharged home ambulating with a rolling walker contact-guard assist for eating to close supervision.  Presented 07/15/2023 with increasing lethargy and disorientation and fever 102.6 as well as blistering of the left foot with ulceration as well as erythema spreading approximately up to the calf.     Admission chemistry showed WBC 15,300, sodium 132, chloride 97, glucose 151, creatinine 1.26, total bilirubin 1.4.  Blood cultures no growth to date.  X-rays of left foot showed dorsal swelling of the forefoot with no current bony destruction.  Patient placed on sepsis protocol status post 1 L lactated Ringer 's.  Patient lactic acidosis was 3.7 and chest x-ray unremarkable.  Placed on broad-spectrum antibiotics.  Underwent abdominal aortogram showing no significant renal artery stenosis visualized.  The infrarenal abdominal aorta widely patent without significant stenosis.  Bilateral common and external iliac arteries widely patent.  The left common femoral and profundofemoral artery calcified but patent without significant stenosis.  The popliteal artery  at the level of the patella had luminal narrowing of approximately 60% circumferentially calcified.  The below-knee popliteal artery widely patent with single-vessel runoff via the peroneal artery providing perfusion to the foot.  Patient underwent successful shockwave intra-arterial lithotripsy of the 60-70% left above-knee popliteal artery stenosis 07/20/2023 per Dr. Serene.  Patient is currently completing a course of Augmentin /doxycycline .  Plan is to repeat Doppler as an outpatient in 4 to 6 weeks.  He continues with chronic Plavix  for peripheral vascular disease as prior to  admission.  Blood pressure and heart rate closely monitor with history of atrial fibrillation maintained on Librexa trial drug (comparing Eliquis  versus Milvexian-factor XI inhibitor).  Hospital course AKI resolved with gentle IV fluids latest creatinine 0.80.  Patient's mental status has cleared appears to be close to baseline.     Patient[facility] medical record from [facility] has been reviewed by the rehabilitation admission coordinator and physician.   Past Medical History      Past Medical History:  Diagnosis Date   A-fib (HCC)     B12 deficiency     Diverticulosis     Fatigue     History of colonic polyps     Hyperlipemia     Hypertension     Pernicious anemia     Prostate cancer (HCC)     Syncope     Syncope          Has the patient had major surgery during 100 days prior to admission? Yes   Family History   family history is not on file.   Current Medications  Current Medications    Current Facility-Administered Medications:    acetaminophen  (TYLENOL ) tablet 650 mg, 650 mg, Oral, Q4H PRN, Serene Gaile ORN, MD, 650 mg at 07/26/23 1552   amLODipine  (NORVASC ) tablet 2.5 mg, 2.5 mg, Oral, Daily, Odell Celinda Balo, MD, 2.5 mg at 07/27/23 9096   amoxicillin -clavulanate (AUGMENTIN ) 875-125 MG per tablet 1 tablet, 1 tablet, Oral, Q12H, Odell Celinda Balo, MD, 1 tablet at 07/27/23 0902   clopidogrel  (PLAVIX ) tablet 75 mg, 75 mg, Oral, Q breakfast, Serene Gaile ORN, MD, 75 mg at 07/27/23 9140   doxycycline  (VIBRA -TABS) tablet 100 mg, 100 mg, Oral, Q12H, Feliz Ortiz, Abraham, MD, 100 mg at 07/27/23 9097   ezetimibe  (ZETIA ) tablet 10 mg, 10 mg, Oral, Daily, Goel, Hersh, MD, 10 mg at 07/27/23 9097   finasteride  (PROSCAR ) tablet 5 mg, 5 mg, Oral, Daily, Goel, Hersh, MD, 5 mg at 07/27/23 9097   hydrALAZINE  (APRESOLINE ) injection 5 mg, 5 mg, Intravenous, Q20 Min PRN, Serene Gaile ORN, MD   insulin  aspart (novoLOG ) injection 0-5 Units, 0-5 Units, Subcutaneous, QHS, Goel, Hersh,  MD   insulin  aspart (novoLOG ) injection 0-9 Units, 0-9 Units, Subcutaneous, TID WC, Goel, Hersh, MD, 1 Units at 07/27/23 9140   labetalol  (NORMODYNE ) injection 10 mg, 10 mg, Intravenous, Q10 min PRN, Serene Gaile ORN, MD   metoprolol  succinate (TOPROL -XL) 24 hr tablet 12.5 mg, 12.5 mg, Oral, Q12H, Odell Celinda Balo, MD   ondansetron  (ZOFRAN ) injection 4 mg, 4 mg, Intravenous, Q6H PRN, Serene Gaile ORN, MD   oxyCODONE  (Oxy IR/ROXICODONE ) immediate release tablet 5 mg, 5 mg, Oral, Q6H PRN, Goel, Hersh, MD, 5 mg at 07/27/23 9389   pantoprazole  (PROTONIX ) EC tablet 40 mg, 40 mg, Oral, Daily, Goel, Hersh, MD, 40 mg at 07/27/23 0903   polyethylene glycol (MIRALAX  / GLYCOLAX ) packet 17 g, 17 g, Oral, Daily PRN, Goel, Hersh, MD, 17 g at 07/23/23 [phone]  rosuvastatin  (CRESTOR ) tablet 20 mg, 20 mg, Oral, Daily, Goel, Hersh, MD, 20 mg at 07/27/23 0902   senna-docusate (Senokot-S) tablet 2 tablet, 2 tablet, Oral, BID, Goel, Hersh, MD, 2 tablet at 07/27/23 0902   sodium chloride  flush (NS) 0.9 % injection 10-40 mL, 10-40 mL, Intracatheter, Q12H, Odell Celinda Balo, MD, 10 mL at 07/27/23 9085   sodium chloride  flush (NS) 0.9 % injection 10-40 mL, 10-40 mL, Intracatheter, PRN, Odell Celinda Balo, MD   STUDY - LIBREXIA-AF - apixaban  5 mg or placebo capsule (PI-Sethi), 5 mg, Oral, BID, Sethi, Pramod S, MD, 5 mg at 07/26/23 2205   STUDY - LIBREXIA-AF - [id number] (Milvexian) 100 mg or placebo tablet (PI-Sethi), 1 tablet, Oral, BID, Sethi, Pramod S, MD, 1 tablet at 07/26/23 2204     Patients Current Diet:  Diet Order                  Diet regular Room service appropriate? Yes; Fluid consistency: Thin  Diet effective now                       Precautions / Restrictions Precautions Precautions: Fall, Other (comment) Precaution/Restrictions Comments: bilat foot wounds Restrictions Weight Bearing Restrictions Per Provider Order: No    Has the patient had 2 or more falls or a fall with injury in  the past year? No   Prior Activity Level Community (5-7x/wk): works partime as Recruitment consultant. Recent d/c from CIR 4/23 Mod I rollator   Prior Functional Level Self Care: Did the patient need help bathing, dressing, using the toilet or eating? Needed some help   Indoor Mobility: Did the patient need assistance with walking from room to room (with or without device)? Independent   Stairs: Did the patient need assistance with internal or external stairs (with or without device)? Independent   Functional Cognition: Did the patient need help planning regular tasks such as shopping or remembering to take medications? Independent   Patient Information Are you of Hispanic, Latino/a,or Spanish origin?: A. No, not of Hispanic, Latino/a, or Spanish origin What is your race?: A. White Do you need or want an interpreter to communicate with a doctor or health care staff?: 0. No   Patient's Response To:  Health Literacy and Transportation Is the patient able to respond to health literacy and transportation needs?: Yes Health Literacy - How often do you need to have someone help you when you read instructions, pamphlets, or other written material from your doctor or pharmacy?: Never In the past 12 months, has lack of transportation kept you from medical appointments or from getting medications?: No In the past 12 months, has lack of transportation kept you from meetings, work, or from getting things needed for daily living?: No   Journalist, newspaper / Equipment Home Equipment: Medical laboratory scientific officer - single point, Rollator (4 wheels), Agricultural consultant (2 wheels), Shower seat, Hand held shower head, Toilet riser, Grab bars - toilet, Grab bars - tub/shower   Prior Device Use: Indicate devices/aids used by the patient prior to current illness, exacerbation or injury? rollator   Current Functional Level Cognition   Orientation Level: Oriented X4    Extremity Assessment (includes Sensation/Coordination)   Upper  Extremity Assessment: Generalized weakness  Lower Extremity Assessment: Defer to PT evaluation RLE Deficits / Details: Edematous, erythematous LLE Deficits / Details: Edematous, erythematous     ADLs   Overall ADL's : Needs assistance/impaired Eating/Feeding: Independent Grooming: Wash/dry hands, Wash/dry face, Contact guard assist, Standing  Upper Body Bathing: Contact guard assist, Sitting Lower Body Bathing: Moderate assistance, Sitting/lateral leans Upper Body Dressing : Contact guard assist, Sitting Lower Body Dressing: Total assistance Lower Body Dressing Details (indicate cue type and reason): assisted for socks Toilet Transfer: Moderate assistance, Ambulation, Rolling walker (2 wheels), Regular Toilet, BSC/3in1, Grab bars Toileting- Clothing Manipulation and Hygiene: Maximal assistance, Sit to/from stand Functional mobility during ADLs: Moderate assistance, Rolling walker (2 wheels) General ADL Comments: focused on mobility and transfers     Mobility   Overal bed mobility: Needs Assistance Bed Mobility: Supine to Sit, Sit to Supine Supine to sit: Contact guard, Used rails, HOB elevated Sit to supine: Contact guard assist, Used rails General bed mobility comments: increased time and effort     Transfers   Overall transfer level: Needs assistance Equipment used: Rolling walker (2 wheels) Transfers: Sit to/from Stand Sit to Stand: From elevated surface, Mod assist Bed to/from chair/wheelchair/BSC transfer type:: Step pivot Step pivot transfers: Min assist, +2 physical assistance General transfer comment: increased time and multi attempts to power up from elevated bed and 3 in 1 over toilet     Ambulation / Gait / Stairs / Wheelchair Mobility   Ambulation/Gait Ambulation/Gait assistance: Editor, commissioning (Feet): 25 Feet (x 2) Assistive device: Rolling walker (2 wheels) Gait Pattern/deviations: Trunk flexed, Antalgic, Decreased stride length, Step-to pattern General  Gait Details: leading with LLE, cues to look up, seated rest break between gait trials Gait velocity: decreased Gait velocity interpretation: <1.31 ft/sec, indicative of household ambulator     Posture / Balance Balance Overall balance assessment: Needs assistance Sitting-balance support: No upper extremity supported, Feet supported Sitting balance-Leahy Scale: Fair Standing balance support: Bilateral upper extremity supported, During functional activity, Reliant on assistive device for balance Standing balance-Leahy Scale: Poor Standing balance comment: reliant on BUEsupport with RW     Special needs/care consideration New DM dx last admit with Hgb A1c 6.2 Was attending [facility] as OP     Bullins, Powell PARAS, RN  Registered Nurse WOC   Consult Note    Signed   Date of Service: 07/16/2023  5:33 AM    Signed       Show:Clear all [x] Written[x] Templated[] Copied   Added by: [x] Bullins, Powell PARAS, RN   [] Hover for details WOC Nurse Consult Note:patient is known to [facility] team from previous admissions with venous ulcers  and large serum filled blisters to L foot; followed by Dr. Harden; last seen in his office 07/15/2023  Reason for Consult: multiple wounds  Wound type: full thickness r/t venous insufficiency R lower leg and L foot  Pressure Injury POA: NA, not related to pressure  Measurement: see nursing flowsheet  Wound bed: R calf brown necrotic, R achilles 75% red 25% yellow; L dorsal foot, base of Great toe and 2nd digit pink and moist  Drainage (amount, consistency, odor) see nursing flowsheet  Periwound: L foot edema and erythema  Dressing procedure/placement/frequency:  Cleanse R leg wounds with Vashe wound cleanser Soila [phone]), do not rinse and allow to air dry.  Apply Vashe moistened gauze to wound beds daily, cover with dry gauze, ABD pad and Kerlix roll gauze starting right above toes and ending right below knee.  May secure with Ace bandage  wrapped in same fashion as Kerlix for light compression.   Cleanse L dorsal foot/toe wounds with Vashe wound cleanser.  Apply Vashe moistened gauze to wound beds daily, cover with dry gauze and secure with Kerlix roll gauze.  POC discussed with bedside nurse.  Any orders placed by Dr. Harden supercede these wound care orders.  WOC team will not follow. Re-consult if further needs arise.    Thank you,      Powell Bar MSN, RN-BC, [employer] [phone]                           Previous Home Environment  Living Arrangements: Spouse/significant other  Lives With: Spouse Available Help at Discharge: Family, Available 24 hours/day (son, Arlyss to hire 4-5 hrs per day caregiver asisstance) Type of Home: House Home Layout: Multi-level, Able to live on main level with bedroom/bathroom Alternate Level Stairs-Number of Steps: 2 steps down into kitchen/living room with railing. main level has bedroom/bathroom and an upstairs that pt does not have to use Home Access: Stairs to enter Entrance Stairs-Rails: Can reach both Entrance Stairs-Number of Steps: 3 STE front entry with R ledge vs rail, planning to install rail Bathroom Shower/Tub: Psychologist, counselling, Door [employer] Toilet: Standard Bathroom Accessibility: Yes How Accessible: Accessible via walker Home Care Services: No (was arranged OP Neuro rehab after d/c from CIR 4/23 but had not yet attended) Additional Comments: has [facility]   Discharge Living Setting Plans for Discharge Living Setting: Patient's home, Lives with (comment) (wife) Type of Home at Discharge: House Discharge Home Layout: Multi-level, Able to live on main level with bedroom/bathroom Alternate Level Stairs-Number of Steps: flight Discharge Home Access: Stairs to enter Entrance Stairs-Rails: Can reach both, Left, Right Entrance Stairs-Number of Steps: 3 steps Discharge Bathroom Shower/Tub: Walk-in shower, Door Discharge Bathroom Toilet: Standard Discharge Bathroom  Accessibility: Yes How Accessible: Accessible via walker Does the patient have any problems obtaining your medications?: No   Social/Family/Support Systems Patient Roles: Spouse, Parent (Recruitment consultant) Solicitor Information: son, Arlyss, to be primary contact. Wife, Inocente, [place] Anticipated Caregiver: wife and hired caregivers Anticipated Industrial/product designer Information: see contacts Ability/Limitations of Caregiver: wife HOH, can provide superivsion level Caregiver Availability: 24/7 Discharge Plan Discussed with Primary Caregiver: Yes Is Caregiver In Agreement with Plan?: Yes Does Caregiver/Family have Issues with Lodging/Transportation while Pt is in Rehab?: No   Goals Patient/Family Goal for Rehab: Mod I to supervision PT, supervision to min with ADLS Expected length of stay: ELOS 7 to 10 days Additional Information: Son, Arlyss, asking for a list of vendors to arrange caregiver supports 4 to 5 hrs per day to asisst with wound dressing changes, etc Pt/Family Agrees to Admission and willing to participate: Yes Program Orientation Provided & Reviewed with Pt/Caregiver Including Roles  & Responsibilities: Yes Additional Information Needs: CIR for 11 days, d/c 4/23 Mod I with rollator. Patietn needs to keep legs elevated with toes above his nose at all times Information Needs to be Provided By: private duty caregiver vendors   Decrease burden of Care through IP rehab admission: n/a   Possible need for SNF placement upon discharge: not anticipated   Patient Condition: I have reviewed medical records from [facility], spoken with patient and son. I met with patient at the bedside and discussed via phone for inpatient rehabilitation assessment.  Patient will benefit from ongoing PT and OT, can actively participate in 3 hours of therapy a day 5 days of the week, and can make measurable gains during the admission.  Patient will also benefit from the coordinated team approach during an  Inpatient Acute Rehabilitation admission.  The patient will receive intensive therapy as well as Rehabilitation physician, nursing, social worker, and  care management interventions.  Due to bladder management, bowel management, safety, skin/wound care, disease management, medication administration, pain management, and patient education the patient requires 24 hour a day rehabilitation nursing.  The patient is currently min assist overall with mobility and basic ADLs.  Discharge setting and therapy post discharge at home with home health is anticipated.  Patient has agreed to participate in the Acute Inpatient Rehabilitation Program and will admit today.   Preadmission Screen Completed By:  Alison Heron Lot RN MSN 07/27/2023 10:04 AM ______________________________________________________________________   Discussed status with Dr. Babs on 07/27/2023 at 1004 and received approval for admission today.   Admission Coordinator:  Alison Heron Lot, RN MSN time 1004 Date 07/27/2023    Assessment/Plan: Diagnosis: debility after sepsis Does the need for close, 24 hr/day Medical supervision in concert with the patient's rehab needs make it unreasonable for this patient to be served in a less intensive setting? Yes Co-Morbidities requiring supervision/potential complications: a fib, dm, prostate cancer, PVD Due to bladder management, bowel management, safety, skin/wound care, disease management, medication administration, pain management, and patient education, does the patient require 24 hr/day rehab nursing? Yes Does the patient require coordinated care of a physician, rehab nurse, PT, OT to address physical and functional deficits in the context of the above medical diagnosis(es)? Yes Addressing deficits in the following areas: balance, endurance, locomotion, strength, transferring, bowel/bladder control, bathing, dressing, feeding, grooming, toileting, and psychosocial support Can the patient  actively participate in an intensive therapy program of at least 3 hrs of therapy 5 days a week? Yes The potential for patient to make measurable gains while on inpatient rehab is excellent Anticipated functional outcomes upon discharge from inpatient rehab: modified independent and supervision PT, supervision and min assist OT, n/a SLP Estimated rehab length of stay to reach the above functional goals is: 7-10 days Anticipated discharge destination: Home 10. Overall Rehab/Functional Prognosis: excellent     MD Signature: Arthea IVAR Babs, MD, [facility] Physical Medicine & Rehabilitation Medical Director Rehabilitation Services 07/27/2023           Revision History

## 2023-09-30 DIAGNOSIS — I87311 Chronic venous hypertension (idiopathic) with ulcer of right lower extremity: Secondary | ICD-10-CM | POA: Diagnosis not present

## 2023-09-30 DIAGNOSIS — I739 Peripheral vascular disease, unspecified: Secondary | ICD-10-CM | POA: Diagnosis not present

## 2023-09-30 DIAGNOSIS — L97919 Non-pressure chronic ulcer of unspecified part of right lower leg with unspecified severity: Secondary | ICD-10-CM | POA: Diagnosis not present

## 2023-09-30 DIAGNOSIS — I10 Essential (primary) hypertension: Secondary | ICD-10-CM | POA: Diagnosis not present

## 2023-10-01 ENCOUNTER — Ambulatory Visit: Admitting: Family

## 2023-10-05 DIAGNOSIS — L03116 Cellulitis of left lower limb: Secondary | ICD-10-CM | POA: Diagnosis not present

## 2023-10-06 ENCOUNTER — Ambulatory Visit (INDEPENDENT_AMBULATORY_CARE_PROVIDER_SITE_OTHER): Admitting: Neurology

## 2023-10-06 ENCOUNTER — Encounter: Payer: Self-pay | Admitting: Neurology

## 2023-10-06 VITALS — BP 126/75 | HR 71 | Ht 72.0 in | Wt 214.2 lb

## 2023-10-06 DIAGNOSIS — I4891 Unspecified atrial fibrillation: Secondary | ICD-10-CM | POA: Diagnosis not present

## 2023-10-06 DIAGNOSIS — G459 Transient cerebral ischemic attack, unspecified: Secondary | ICD-10-CM | POA: Diagnosis not present

## 2023-10-06 DIAGNOSIS — I6522 Occlusion and stenosis of left carotid artery: Secondary | ICD-10-CM

## 2023-10-06 DIAGNOSIS — I2585 Chronic coronary microvascular dysfunction: Secondary | ICD-10-CM

## 2023-10-06 NOTE — Patient Instructions (Signed)
 I had a long d/w patient about his recent episode of transient aphasia, persistent atrial fibrillation, risk for recurrent stroke/TIAs, personally independently reviewed imaging studies and stroke evaluation results and answered questions.Continue Plavix  and Librexia AF trial (eliquis  versus milvexian-factor 11 inhibitor) medication daily for secondary stroke prevention for his atrial fibrillation and maintain strict control of hypertension with blood pressure goal below 130/90, diabetes with hemoglobin A1c goal below 6.5% and lipids with LDL cholesterol goal below 70 mg/dL. I also advised the patient to eat a healthy diet with plenty of whole grains, cereals, fruits and vegetables, exercise regularly and maintain ideal body weight . Check follow-up sctreening carotid ultrasound and if left ICA stenosis has progressed neck may consider elective revascularization... Check screening lipid profile and hemoglobin A1c.  Continue participation in [place] AF trial (eliquis  versus milvexian-factor 11 inhibitor) and followup in the future with me in 1 year or call earlier if needed.
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
## 2023-10-07 ENCOUNTER — Ambulatory Visit: Payer: Self-pay | Admitting: Neurology

## 2023-10-07 LAB — LIPID PANEL
Chol/HDL Ratio: 2.2 ratio (ref 0.0–5.0)
Cholesterol, Total: 84 mg/dL — ABNORMAL LOW (ref 100–199)
HDL: 39 mg/dL — ABNORMAL LOW (ref >39)
LDL Chol Calc (NIH): 26 mg/dL (ref 0–99)
Triglycerides: 95 mg/dL (ref 0–149)
VLDL Cholesterol Cal: 19 mg/dL (ref 5–40)

## 2023-10-07 LAB — HEMOGLOBIN A1C
Est. average glucose Bld gHb Est-mCnc: 131 mg/dL
Hgb A1c MFr Bld: 6.2 % — ABNORMAL HIGH (ref 4.8–5.6)

## 2023-10-08 DIAGNOSIS — L03116 Cellulitis of left lower limb: Secondary | ICD-10-CM | POA: Diagnosis not present

## 2023-10-11 ENCOUNTER — Encounter: Payer: Self-pay | Admitting: Cardiovascular Disease

## 2023-10-11 ENCOUNTER — Ambulatory Visit: Attending: Cardiovascular Disease | Admitting: Cardiovascular Disease

## 2023-10-11 VITALS — BP 128/78 | HR 61 | Ht 72.0 in | Wt 216.4 lb

## 2023-10-11 DIAGNOSIS — I739 Peripheral vascular disease, unspecified: Secondary | ICD-10-CM | POA: Insufficient documentation

## 2023-10-11 DIAGNOSIS — I10 Essential (primary) hypertension: Secondary | ICD-10-CM | POA: Insufficient documentation

## 2023-10-11 DIAGNOSIS — E785 Hyperlipidemia, unspecified: Secondary | ICD-10-CM | POA: Diagnosis not present

## 2023-10-11 DIAGNOSIS — I4819 Other persistent atrial fibrillation: Secondary | ICD-10-CM | POA: Diagnosis not present

## 2023-10-11 NOTE — Assessment & Plan Note (Signed)
 Rate controlled on a study oral anticoagulant.

## 2023-10-11 NOTE — Assessment & Plan Note (Signed)
 History of PAD followed by vein and vascular specialist.  He recently had shockwave angioplasty of his left popliteal artery by Dr. Serene.

## 2023-10-11 NOTE — Progress Notes (Signed)
 10/11/2023 Logan French   [birth year]  [id number]  Primary Physician Yolande Toribio MATSU, MD Primary Cardiologist: Dorn JINNY Lesches MD [facility] [place], [place]  HPI:  Logan French is a 89 y.o.   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  He still does some occasional legal work.  He is formally a patient of Dr. Joesphine.  I am assuming his care in Dr. Joesphine absence since his retirement and will be transferring his care to Dr. Shlomo next year.  He has been an active tennis player in the past but recently had some PAD issues with interventions by vein and vascular specialist.  He has a history of treated hypertension, diabetes and hyperlipidemia.  Never had a heart attack or stroke.  He denies chest pain or shortness of breath.  He does have persistent A-fib on a study oral anticoagulant.   Current Meds  Medication Sig   acetaminophen  (TYLENOL ) 325 MG tablet Take 2 tablets (650 mg total) by mouth every 4 (four) hours as needed for headache or mild pain (pain score 1-3).   clopidogrel  (PLAVIX ) 75 MG tablet Take 1 tablet (75 mg total) by mouth daily.   colchicine  0.6 MG tablet Take 1 tablet (0.6 mg total) by mouth daily.   collagenase  (SANTYL ) 250 UNIT/GM ointment Apply topically daily. Apply to affected area as directed   cyanocobalamin  (VITAMIN B12) 1000 MCG tablet Take 0.5 tablets (500 mcg total) by mouth daily.   diclofenac  Sodium (VOLTAREN ) 1 % GEL Apply 2 g topically 4 (four) times daily.   doxycycline  (VIBRA -TABS) 100 MG tablet Take 1 tablet (100 mg total) by mouth every 12 (twelve) hours.   ezetimibe  (ZETIA ) 10 MG tablet Take 1 tablet (10 mg total) by mouth daily.   finasteride  (PROSCAR ) 5 MG tablet Take 1 tablet (5 mg total) by mouth daily.   hydrochlorothiazide  (HYDRODIURIL ) 12.5 MG tablet Take 1 tablet (12.5 mg total) by mouth daily.   metFORMIN  (GLUCOPHAGE ) 500 MG  tablet Take 1 tablet (500 mg total) by mouth daily with breakfast.   metoprolol  succinate (TOPROL -XL) 25 MG 24 hr tablet Take 0.5 tablets (12.5 mg total) by mouth every 12 (twelve) hours.   oxyCODONE  (OXY IR/ROXICODONE ) 5 MG immediate release tablet Take 1 tablet (5 mg total) by mouth every 6 (six) hours as needed for severe pain (pain score 7-10).   pantoprazole  (PROTONIX ) 40 MG tablet Take 1 tablet (40 mg total) by mouth daily.   rosuvastatin  (CRESTOR ) 20 MG tablet Take 1 tablet (20 mg total) by mouth daily.   STUDY - LIBREXIA-AF - apixaban  5 mg or placebo capsule (PI-Sethi) Take 1 capsule (5 mg total) by mouth 2 (two) times daily. Take at approximately the same time of day with or without food. Please bring bottle back with you to every visit; do not discard bottle. For Investigational Use Only.   STUDY - LIBREXIA-AF - [id number] (Milvexian) 100 mg or placebo tablet (PI-Sethi) Take 1 tablet by mouth 2 (two) times daily. Take at approximately the same time of day with or without food. Please bring bottle back with you to every visit; do not discard bottle. For Investigational Use Only.   Triamcinolone  Acetonide (TRIAMCINOLONE  0.1 % CREAM : EUCERIN) CREA Apply 1 Application topically 2 (two) times daily.   vitamin D3 (CHOLECALCIFEROL ) 25 MCG tablet Take 1 tablet (1,000 Units total) by mouth daily.  No Known Allergies  Social History   Socioeconomic History   Marital status: Married    Spouse name: Not on file   Number of children: Not on file   Years of education: Not on file   Highest education level: Not on file  Occupational History   Not on file  Tobacco Use   Smoking status: Never   Smokeless tobacco: Never   Tobacco comments:    Never smoke 09/23/21  Vaping Use   Vaping status: Never Used  Substance and Sexual Activity   Alcohol  use: Yes    Alcohol /week: 2.0 standard drinks of alcohol     Types: 1 Cans of beer, 1 Shots of liquor per week    Comment: occasionally   Drug  use: No   Sexual activity: Not on file  Other Topics Concern   Not on file  Social History Narrative   Lives with wife   Right Handed   Drinks 1-2 cups daily   Retired    Social Drivers of Corporate investment banker Strain: Not on file  Food Insecurity: No Food Insecurity (07/16/2023)   Hunger Vital Sign    Worried About Running Out of Food in the Last Year: Never true    Ran Out of Food in the Last Year: Never true  Transportation Needs: No Transportation Needs (07/16/2023)   PRAPARE - Administrator, Civil Service (Medical): No    Lack of Transportation (Non-Medical): No  Physical Activity: Not on file  Stress: Not on file  Social Connections: Socially Integrated (07/16/2023)   Social Connection and Isolation Panel    Frequency of Communication with Friends and Family: Twice a week    Frequency of Social Gatherings with Friends and Family: Twice a week    Attends Religious Services: 1 to 4 times per year    Active Member of [employer] or Organizations: Yes    Attends Banker Meetings: Never    Marital Status: Married  Catering manager Violence: Not At Risk (07/16/2023)   Humiliation, Afraid, Rape, and Kick questionnaire    Fear of Current or Ex-Partner: No    Emotionally Abused: No    Physically Abused: No    Sexually Abused: No     Review of Systems: General: negative for chills, fever, night sweats or weight changes.  Cardiovascular: negative for chest pain, dyspnea on exertion, edema, orthopnea, palpitations, paroxysmal nocturnal dyspnea or shortness of breath Dermatological: negative for rash Respiratory: negative for cough or wheezing Urologic: negative for hematuria Abdominal: negative for nausea, vomiting, diarrhea, bright red blood per rectum, melena, or hematemesis Neurologic: negative for visual changes, syncope, or dizziness All other systems reviewed and are otherwise negative except as noted above.    Blood pressure 128/78, pulse 61,  height 6' (1.829 m), weight 216 lb 6.4 oz (98.2 kg), SpO2 98%.  General appearance: alert and no distress Neck: no adenopathy, no carotid bruit, no JVD, supple, symmetrical, trachea midline, and thyroid  not enlarged, symmetric, no tenderness/mass/nodules Lungs: clear to auscultation bilaterally Heart: irregularly irregular rhythm Extremities: extremities normal, atraumatic, no cyanosis or edema Pulses: 2+ and symmetric Skin: Skin color, texture, turgor normal. No rashes or lesions Neurologic: Grossly normal  EKG not performed today      ASSESSMENT AND PLAN:   Persistent atrial fibrillation (HCC) Rate controlled on a study oral anticoagulant.  Peripheral arterial disease (HCC) History of PAD followed by vein and vascular specialist.  He recently had shockwave angioplasty of his left popliteal artery  by Dr. Serene.  Dyslipidemia History of dyslipidemia on statin therapy and Zetia  with lipid profile performed 07/21/2023 revealing total cholesterol 59, LDL 27 and HDL of 18.  HTN (hypertension) History of essential hypertension with blood pressure measured today 128/78.  He is on hydrochlorothiazide  and metoprolol .     Dorn DOROTHA Lesches MD [facility], [facility] 10/11/2023 2:35 PM

## 2023-10-11 NOTE — Assessment & Plan Note (Signed)
 History of dyslipidemia on statin therapy and Zetia  with lipid profile performed 07/21/2023 revealing total cholesterol 59, LDL 27 and HDL of 18.

## 2023-10-11 NOTE — Patient Instructions (Signed)
 Medication Instructions:  Your physician recommends that you continue on your current medications as directed. Please refer to the Current Medication list given to you today.  *If you need a refill on your cardiac medications before your next appointment, please call your pharmacy*   Follow-Up: At [facility], you and your health needs are our priority.  As part of our continuing mission to provide you with exceptional heart care, our providers are all part of one team.  This team includes your primary Cardiologist (physician) and Advanced Practice Providers or APPs (Physician Assistants and Nurse Practitioners) who all work together to provide you with the care you need, when you need it.  Your next appointment:   12 month(s)  Provider:   Dr. Wilbert Bihari

## 2023-10-11 NOTE — Telephone Encounter (Signed)
 Pt called and was informed of his lab results. Pt was appreciative and wanted to inform provider that his  VAS US  CAROTID has been scheduled.

## 2023-10-11 NOTE — Assessment & Plan Note (Signed)
 History of essential hypertension with blood pressure measured today 128/78.  He is on hydrochlorothiazide  and metoprolol .

## 2023-10-12 ENCOUNTER — Ambulatory Visit (INDEPENDENT_AMBULATORY_CARE_PROVIDER_SITE_OTHER): Admitting: Family

## 2023-10-12 DIAGNOSIS — I87319 Chronic venous hypertension (idiopathic) with ulcer of unspecified lower extremity: Secondary | ICD-10-CM

## 2023-10-12 DIAGNOSIS — I890 Lymphedema, not elsewhere classified: Secondary | ICD-10-CM

## 2023-10-12 DIAGNOSIS — L03116 Cellulitis of left lower limb: Secondary | ICD-10-CM | POA: Diagnosis not present

## 2023-10-13 ENCOUNTER — Telehealth: Payer: Self-pay | Admitting: Family

## 2023-10-13 NOTE — Telephone Encounter (Signed)
 Pt called stating he need bandages for wound. Pt states bandages leaked thru from yesterday visit. Please call pt at [phone].

## 2023-10-13 NOTE — Telephone Encounter (Signed)
 Pt's nurse came to office this morning. Gave her kerlex and ace wraps with silvercel. This will last until apt on Tuesday, if not we can put in order for supplies to be delivered through his insurance benefits. He didn't leave his dynaflex compression wraps on last night, complained they were too tight and couldn't sleep. She asked why they couldn't have those again, I let her know that these are to be applied in office only. This is a skilled need with a certain compression to be applied. Offered that we could put in for home health to come out weekly or twice a week and she said that pt had that before and he stated they didn't know what they were doing and doesn't want home health again.

## 2023-10-14 ENCOUNTER — Encounter: Payer: Self-pay | Admitting: Family

## 2023-10-14 NOTE — Progress Notes (Signed)
 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  It appears he has been using nonadherent dressings and Kerlix for his wound care he has a very hard time help tolerating compression.  His sons have assisted him in obtaining sequential compression pants by the name of speed wound at the advice of Dr. Jakie.  He has scheduled that he will sit in the recliner and wear these 5 times a day for 20 minutes at a time he hopes to elevate his lower extremities during this routine  Unfortunately today there is concern for worsening of his ulcerations new ulcers to the right lower extremity and larger ulcers on the left lower extremity  Has recently been to vascular ABIs were normal.  No intervention recommended.  Has also recently been to cardiology there is no history of heart failure  However he is taking Lasix .  Has not noticed improvement in edema on Lasix   Assessment & Plan: Visit Diagnoses: No diagnosis found.  Plan: Reinforced that Santyl  must only be used on fibrinous tissue not unhealthy granulation or healthy skin.  Had a lengthy discussion with the patient and his wife and his home nurse about the importance of compression and elevation and scheduled use of the speed hound  Will trial Dynaflex compression wraps bilaterally.  Discussed that when these become painful it will be important that he elevate his feet to the level of his heart.  We will also reach out to tactile for a consultation for lymphedema pumps possible  set up with pumps but hope that she could provide some education and evaluation  Follow-Up Instructions: No follow-ups on file.   Ortho Exam  Patient is alert, oriented, no adenopathy, well-dressed, normal affect, normal respiratory effort. On examination of the bilateral lower extremities there is edema up to the tibial tubercle with dimpling.  Pitting present.  There is weeping is present there is no erythema warmth no sign of cellulitis  The left calf posterior ulcer has now merged with 2 satellite ulcers.  Overall the wound bed is improved there is about 75% granulation which is improved from last viewing however at this time the ulcer is now measuring about 6 cm in diameter there is no purulence or sign of infection  To the medial calf he also has a worsening new ulcer which is nickel sized and 5 mm deep filled in with 50% granulation 50% fibrinous tissue  Left posterior Achilles area there has been further skin breakdown this now is about 4 cm in length 2 and half centimeters in width with surrounding maceration again no sign of infection    Left anterior lower leg with scattered 5 mm in diameter ulcer is filled in with 100% fibrinous tissue and weeping present  Circumferential measurements from today right lower extremity ankle 24 cm calf 38 cm knee 40 cm  left ankle 26 cm left calf 37 cm left knee 46 cm  Imaging: No results found. No images are attached to the encounter.  Labs: Lab Results  Component Value Date   HGBA1C 6.2 (H) 10/06/2023   HGBA1C 6.4 (H) 06/17/2023   CRP 26.9 (H) 06/23/2023   CRP 27.5 (H) 06/22/2023   CRP 27.6 (H) 06/21/2023   LABURIC 5.0 06/22/2023   LABURIC 5.3 06/21/2023   REPTSTATUS 07/20/2023 FINAL 07/15/2023   GRAMSTAIN Rare 10/29/2015   GRAMSTAIN WBC present-predominately Mononuclear 10/29/2015   GRAMSTAIN No Squamous Epithelial Cells Seen 10/29/2015   GRAMSTAIN Moderate Gram Positive Cocci In Pairs In Clusters 10/29/2015   CULT  07/15/2023     NO GROWTH 5 DAYS Performed at [facility], [address], [place], [place] [address]    LABORGA GROUP A STREP (S.PYOGENES) ISOLATED 06/17/2023     Lab Results  Component Value Date   ALBUMIN 2.0 (L) 08/04/2023   ALBUMIN 2.2 (L) 08/02/2023   ALBUMIN 2.1 (L) 07/28/2023    Lab Results  Component Value Date   MG 2.0 06/23/2023   MG 2.0 06/22/2023   MG 2.0 06/21/2023   Lab Results  Component Value Date   VD25OH 27.89 (L) 06/26/2023    No results found for: PREALBUMIN    Latest Ref Rng & Units 08/07/2023    7:25 AM 08/06/2023    6:43 AM 08/05/2023   10:46 AM  CBC EXTENDED  WBC 4.0 - 10.5 K/uL 6.9  7.7  11.8   RBC 4.22 - 5.81 MIL/uL 4.16  3.92  4.40   Hemoglobin 13.0 - 17.0 g/dL 88.2  89.0  87.7   HCT 39.0 - 52.0 % 35.9  33.7  37.8   Platelets 150 - 400 K/uL 385  368  458   NEUT# 1.7 - 7.7 K/uL 4.5  5.1  9.7   Lymph# 0.7 - 4.0 K/uL 1.5  1.6  1.0      There is no height or weight on file to calculate BMI.  Orders:  No orders of the defined types were placed in this encounter.  No orders of the defined types were placed in this encounter.    Procedures: No procedures performed  Clinical Data: No additional findings.  ROS:  All other systems negative, except as noted in the HPI. Review of Systems  Objective: Vital Signs: There were no vitals taken for this visit.  Specialty Comments:  No specialty comments available.  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) 06/20/2023   Streptococcal bacteremia 06/18/2023   Cellulitis of right lower extremity 06/17/2023   Persistent atrial fibrillation (HCC) 09/23/2021   Hypercoagulable state due to persistent atrial fibrillation (HCC) 09/23/2021   Callus 10/25/2019   Corns and callosities 10/25/2019   Hav (hallux abducto valgus), unspecified laterality 10/25/2019   Altered awareness, transient 06/23/2017   MALLET FINGER, RIGHT RING FINGER 10/19/2008   Past Medical History:  Diagnosis Date   A-fib (HCC)    B12 deficiency  Diverticulosis    Fatigue    History of colonic polyps    Hyperlipemia    Hypertension    Peripheral vascular disease (HCC)    Pernicious anemia    Prostate cancer (HCC)    Syncope    Syncope     Family History  Problem Relation Age of Onset   Stroke Neg Hx    Transient ischemic attack Neg Hx     Past Surgical History:  Procedure Laterality Date   ABDOMINAL AORTOGRAM N/A 07/20/2023   Procedure: ABDOMINAL AORTOGRAM;  Surgeon: Serene Gaile ORN, MD;  Location: [facility] INVASIVE CV LAB;  Service: Cardiovascular;  Laterality: N/A;   ABDOMINAL AORTOGRAM W/LOWER EXTREMITY N/A 05/17/2023   Procedure: ABDOMINAL AORTOGRAM W/LOWER EXTREMITY;  Surgeon: Pearline Norman RAMAN, MD;  Location[facility] INVASIVE CV LAB;  Service: Cardiovascular;  Laterality: N/A;   CARDIOVERSION N/A 10/07/2021   Procedure: CARDIOVERSION;  Surgeon: Sheena Pugh, DO;  Location: [facility] ENDOSCOPY;  Service: Cardiovascular;  Laterality: N/A;   IR ANGIO INTRA EXTRACRAN SEL COM CAROTID INNOMINATE BILAT MOD SED  09/10/2017   IR ANGIO VERTEBRAL SEL SUBCLAVIAN INNOMINATE BILAT MOD SED  09/10/2017   LOWER EXTREMITY ANGIOGRAPHY N/A 07/20/2023   Procedure: Lower Extremity Angiography;  Surgeon: Serene Gaile ORN, MD;  Location: [facility] INVASIVE CV LAB;  Service: Cardiovascular;  Laterality: N/A;   LOWER EXTREMITY INTERVENTION Left 07/20/2023   Procedure: LOWER EXTREMITY INTERVENTION;  Surgeon: Serene Gaile ORN, MD;   Location: [facility] INVASIVE CV LAB;  Service: Cardiovascular;  Laterality: Left;   PERIPHERAL INTRAVASCULAR LITHOTRIPSY Right 05/17/2023   Procedure: PERIPHERAL INTRAVASCULAR LITHOTRIPSY;  Surgeon: Pearline Norman RAMAN, MD;  Location: [facility] INVASIVE CV LAB;  Service: Cardiovascular;  Laterality: Right;  SFA-POP   PERIPHERAL INTRAVASCULAR LITHOTRIPSY Left 07/20/2023   Procedure: PERIPHERAL INTRAVASCULAR LITHOTRIPSY;  Surgeon: Serene Gaile ORN, MD;  Location: [facility] INVASIVE CV LAB;  Service: Cardiovascular;  Laterality: Left;   PERIPHERAL VASCULAR BALLOON ANGIOPLASTY Right 05/17/2023   Procedure: PERIPHERAL VASCULAR BALLOON ANGIOPLASTY;  Surgeon: Pearline Norman RAMAN, MD;  Location: [facility] INVASIVE CV LAB;  Service: Cardiovascular;  Laterality: Right;  SFA-POP   Social History   Occupational History   Not on file  Tobacco Use   Smoking status: Never   Smokeless tobacco: Never   Tobacco comments:    Never smoke 09/23/21  Vaping Use   Vaping status: Never Used  Substance and Sexual Activity   Alcohol  use: Yes    Alcohol /week: 2.0 standard drinks of alcohol     Types: 1 Cans of beer, 1 Shots of liquor per week    Comment: occasionally   Drug use: No   Sexual activity: Not on file

## 2023-10-15 ENCOUNTER — Telehealth: Payer: Self-pay | Admitting: Family

## 2023-10-15 DIAGNOSIS — L03116 Cellulitis of left lower limb: Secondary | ICD-10-CM | POA: Diagnosis not present

## 2023-10-15 NOTE — Telephone Encounter (Signed)
 Son called. Would like a RX for the topical leg cream. His cb# [phone]

## 2023-10-18 DIAGNOSIS — D500 Iron deficiency anemia secondary to blood loss (chronic): Secondary | ICD-10-CM | POA: Diagnosis not present

## 2023-10-18 DIAGNOSIS — R7302 Impaired glucose tolerance (oral): Secondary | ICD-10-CM | POA: Diagnosis not present

## 2023-10-18 DIAGNOSIS — I10 Essential (primary) hypertension: Secondary | ICD-10-CM | POA: Diagnosis not present

## 2023-10-18 DIAGNOSIS — E785 Hyperlipidemia, unspecified: Secondary | ICD-10-CM | POA: Diagnosis not present

## 2023-10-18 DIAGNOSIS — D649 Anemia, unspecified: Secondary | ICD-10-CM | POA: Diagnosis not present

## 2023-10-18 DIAGNOSIS — C61 Malignant neoplasm of prostate: Secondary | ICD-10-CM | POA: Diagnosis not present

## 2023-10-19 ENCOUNTER — Encounter: Payer: Self-pay | Admitting: Orthopedic Surgery

## 2023-10-19 ENCOUNTER — Ambulatory Visit (INDEPENDENT_AMBULATORY_CARE_PROVIDER_SITE_OTHER): Admitting: Orthopedic Surgery

## 2023-10-19 ENCOUNTER — Ambulatory Visit: Admitting: Family

## 2023-10-19 DIAGNOSIS — I87319 Chronic venous hypertension (idiopathic) with ulcer of unspecified lower extremity: Secondary | ICD-10-CM | POA: Diagnosis not present

## 2023-10-19 DIAGNOSIS — I739 Peripheral vascular disease, unspecified: Secondary | ICD-10-CM

## 2023-10-19 MED ORDER — COLLAGENASE 250 UNIT/GM EX OINT: TOPICAL_OINTMENT | Freq: Every day | CUTANEOUS | 0 refills | Status: DC

## 2023-10-19 NOTE — Telephone Encounter (Signed)
 Will notify pt and caregiver today at visit.

## 2023-10-19 NOTE — Progress Notes (Signed)
 Office Visit Note   Patient: Logan French           Date of Birth: 02/25/1935           MRN: [id number] Visit Date: 10/19/2023              Requested by: Yolande Toribio MATSU, MD [address] [place],  [place] [address] PCP: Yolande Toribio MATSU, MD  Chief Complaint  Patient presents with   Right Leg - Follow-up   Left Leg - Follow-up      HPI: Patient is an 89 year old gentleman who is seen for venous insufficiency ulceration left greater than right lower extremity.  Patient states that recently he has been having increased pain and the ulcers are getting larger and more necrotic.  Patient removed the compression wrap after 1 day due to pain.  Patient is status post angioplasty of both lower extremities.  Balloon angioplasty on the right on March 2025 and balloon angioplasty of the left May 2025.  Ankle-brachial indices showed excellent improvement in flow after the use endovascular interventions.  Assessment & Plan: Visit Diagnoses:  1. 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 PAD (peripheral artery disease) (HCC)     Plan: With the patient's increased ischemic ulceration to the left lower extremity and diminished Doppler pulses on the left.  We will make a urgent referral back to vascular surgery to repeat his ankle-brachial indices and see if there is been a change in the revascularization.  Patient will resume with Dial soap cleansing.  Apply 4 x 4 gauze dampened with Vashe plus Kerlix plus Ace wrap.  Recommended exercise and will hold on any type of lymphedema pumps.  Follow-Up Instructions: Return in about 2 weeks (around 11/02/2023).   Ortho Exam  Patient is alert, oriented, no adenopathy, well-dressed, normal affect, normal respiratory effort. Examination patient has a much larger necrotic ulcer over the posterior left calf.  It currently measures 4 x 8 cm with nonviable tissue at the base of the wound.  There are also  multiple other satellite lesions that are new on the left lower extremity.  There is also cat scratches over the lateral thigh and calf.  Patient has tense pitting edema both lower extremities with weeping edema from both legs.  There is no cellulitis.  The Doppler was used and patient has a biphasic posterior pulse in the left however he has a dampened monophasic dorsalis pedis pulse on the left and a monophasic anterior tibial pulse on the left.  On the right lower extremity patient has a strong biphasic dorsalis pedis pulse on the right.  Most recent hemoglobin A1c is 6.2.  Albumin 2.0.    Imaging: No results found. No images are attached to the encounter.  Labs: Lab Results  Component Value Date   HGBA1C 6.2 (H) 10/06/2023   HGBA1C 6.4 (H) 06/17/2023   CRP 26.9 (H) 06/23/2023   CRP 27.5 (H) 06/22/2023   CRP 27.6 (H) 06/21/2023   LABURIC 5.0 06/22/2023   LABURIC 5.3 06/21/2023   REPTSTATUS 07/20/2023 FINAL 07/15/2023   GRAMSTAIN Rare 10/29/2015   GRAMSTAIN WBC present-predominately Mononuclear 10/29/2015   GRAMSTAIN No Squamous Epithelial Cells Seen 10/29/2015   GRAMSTAIN Moderate Gram Positive Cocci In Pairs In Clusters 10/29/2015   CULT  07/15/2023    NO GROWTH 5 DAYS Performed at [facility], [address], [place], [place] [address]    LABORGA GROUP A STREP (  S.PYOGENES) ISOLATED 06/17/2023     Lab Results  Component Value Date   ALBUMIN 2.0 (L) 08/04/2023   ALBUMIN 2.2 (L) 08/02/2023   ALBUMIN 2.1 (L) 07/28/2023    Lab Results  Component Value Date   MG 2.0 06/23/2023   MG 2.0 06/22/2023   MG 2.0 06/21/2023   Lab Results  Component Value Date   VD25OH 27.89 (L) 06/26/2023    No results found for: PREALBUMIN    Latest Ref Rng & Units 08/07/2023    7:25 AM 08/06/2023    6:43 AM 08/05/2023   10:46 AM  CBC EXTENDED  WBC 4.0 - 10.5 K/uL 6.9  7.7  11.8   RBC 4.22 - 5.81 MIL/uL 4.16  3.92  4.40   Hemoglobin 13.0 - 17.0 g/dL 88.2  89.0  87.7   HCT 39.0  - 52.0 % 35.9  33.7  37.8   Platelets 150 - 400 K/uL 385  368  458   NEUT# 1.7 - 7.7 K/uL 4.5  5.1  9.7   Lymph# 0.7 - 4.0 K/uL 1.5  1.6  1.0      There is no height or weight on file to calculate BMI.  Orders:  Orders Placed This Encounter  Procedures   Ambulatory referral to Vascular Surgery   No orders of the defined types were placed in this encounter.    Procedures: No procedures performed  Clinical Data: No additional findings.  ROS:  All other systems negative, except as noted in the HPI. Review of Systems  Objective: Vital Signs: There were no vitals taken for this visit.  Specialty Comments:  No specialty comments available.  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) 06/20/2023   Streptococcal bacteremia 06/18/2023   Cellulitis of right lower extremity 06/17/2023   Persistent atrial fibrillation (HCC) 09/23/2021   Hypercoagulable state due to persistent atrial fibrillation (HCC) 09/23/2021   Callus 10/25/2019   Corns and callosities 10/25/2019   Hav (hallux abducto valgus), unspecified laterality 10/25/2019   Altered awareness, transient 06/23/2017   MALLET FINGER, RIGHT RING FINGER 10/19/2008   Past Medical History:  Diagnosis Date   A-fib (HCC)    B12 deficiency    Diverticulosis    Fatigue    History of colonic polyps    Hyperlipemia    Hypertension    Peripheral vascular disease (HCC)    Pernicious anemia    Prostate  cancer (HCC)    Syncope    Syncope     Family History  Problem Relation Age of Onset   Stroke Neg Hx    Transient ischemic attack Neg Hx     Past Surgical History:  Procedure Laterality Date   ABDOMINAL AORTOGRAM N/A 07/20/2023   Procedure: ABDOMINAL AORTOGRAM;  Surgeon: Serene Gaile ORN, MD;  Location: [facility] INVASIVE CV LAB;  Service: Cardiovascular;  Laterality: N/A;   ABDOMINAL AORTOGRAM W/LOWER EXTREMITY N/A 05/17/2023   Procedure: ABDOMINAL AORTOGRAM W/LOWER EXTREMITY;  Surgeon: Pearline Norman RAMAN, MD;  Location: [facility] INVASIVE CV LAB;  Service: Cardiovascular;  Laterality: N/A;   CARDIOVERSION N/A 10/07/2021   Procedure: CARDIOVERSION;  Surgeon: Sheena Pugh, DO;  Location: [facility] ENDOSCOPY;  Service: Cardiovascular;  Laterality: N/A;   IR ANGIO INTRA EXTRACRAN SEL COM CAROTID INNOMINATE BILAT MOD SED  09/10/2017   IR ANGIO VERTEBRAL SEL SUBCLAVIAN INNOMINATE BILAT MOD SED  09/10/2017   LOWER EXTREMITY ANGIOGRAPHY N/A 07/20/2023   Procedure: Lower Extremity Angiography;  Surgeon: Serene Gaile ORN, MD;  Location: [facility] INVASIVE CV LAB;  Service: Cardiovascular;  Laterality: N/A;   LOWER EXTREMITY INTERVENTION Left 07/20/2023   Procedure: LOWER EXTREMITY INTERVENTION;  Surgeon: Serene Gaile ORN, MD;  Location: [facility] INVASIVE CV LAB;  Service: Cardiovascular;  Laterality: Left;   PERIPHERAL INTRAVASCULAR LITHOTRIPSY Right 05/17/2023   Procedure: PERIPHERAL INTRAVASCULAR LITHOTRIPSY;  Surgeon: Pearline Norman RAMAN, MD;  Location: [facility] INVASIVE CV LAB;  Service: Cardiovascular;  Laterality: Right;  SFA-POP   PERIPHERAL INTRAVASCULAR LITHOTRIPSY Left 07/20/2023   Procedure: PERIPHERAL INTRAVASCULAR LITHOTRIPSY;  Surgeon: Serene Gaile ORN, MD;  Location: [facility] INVASIVE CV LAB;  Service: Cardiovascular;  Laterality: Left;   PERIPHERAL VASCULAR BALLOON ANGIOPLASTY Right 05/17/2023   Procedure: PERIPHERAL VASCULAR BALLOON ANGIOPLASTY;  Surgeon: Pearline Norman RAMAN, MD;  Location: [facility] INVASIVE CV LAB;  Service: Cardiovascular;  Laterality:  Right;  SFA-POP   Social History   Occupational History   Not on file  Tobacco Use   Smoking status: Never   Smokeless tobacco: Never   Tobacco comments:    Never smoke 09/23/21  Vaping Use   Vaping status: Never Used  Substance and Sexual Activity   Alcohol  use: Yes    Alcohol /week: 2.0 standard drinks of alcohol     Types: 1 Cans of beer, 1 Shots of liquor per week    Comment: occasionally   Drug use: No   Sexual activity: Not on file

## 2023-10-20 DIAGNOSIS — L03116 Cellulitis of left lower limb: Secondary | ICD-10-CM | POA: Diagnosis not present

## 2023-10-21 ENCOUNTER — Other Ambulatory Visit: Payer: Self-pay

## 2023-10-21 ENCOUNTER — Telehealth: Payer: Self-pay | Admitting: Orthopedic Surgery

## 2023-10-21 ENCOUNTER — Ambulatory Visit (HOSPITAL_COMMUNITY)
Admission: RE | Admit: 2023-10-21 | Discharge: 2023-10-21 | Disposition: A | Discharge status: Home / Self Care | Source: Ambulatory Visit | Attending: Vascular Surgery | Admitting: Vascular Surgery

## 2023-10-21 ENCOUNTER — Other Ambulatory Visit (HOSPITAL_COMMUNITY): Payer: Self-pay | Admitting: Orthopedic Surgery

## 2023-10-21 DIAGNOSIS — I872 Venous insufficiency (chronic) (peripheral): Secondary | ICD-10-CM

## 2023-10-21 DIAGNOSIS — I739 Peripheral vascular disease, unspecified: Secondary | ICD-10-CM

## 2023-10-21 NOTE — Telephone Encounter (Signed)
 I spoke with pt he is sch for STAT ABIs tomorrow at 9 am

## 2023-10-21 NOTE — Telephone Encounter (Signed)
 Patient called. Says that he was not informed about him getting a test on his veins. Would like someone to call him. [phone]

## 2023-10-22 ENCOUNTER — Ambulatory Visit (HOSPITAL_COMMUNITY)
Admission: RE | Admit: 2023-10-22 | Discharge: 2023-10-22 | Disposition: A | Discharge status: Home / Self Care | Source: Ambulatory Visit | Attending: Vascular Surgery | Admitting: Vascular Surgery

## 2023-10-22 DIAGNOSIS — I739 Peripheral vascular disease, unspecified: Secondary | ICD-10-CM | POA: Insufficient documentation

## 2023-10-22 DIAGNOSIS — L03116 Cellulitis of left lower limb: Secondary | ICD-10-CM | POA: Diagnosis not present

## 2023-10-22 LAB — VAS US ABI WITH/WO TBI
Left ABI: 0.89
Right ABI: 1.05

## 2023-10-25 ENCOUNTER — Telehealth: Payer: Self-pay | Admitting: Surgery

## 2023-10-25 NOTE — Telephone Encounter (Signed)
 Patient name: Logan French MRN: [id number] DOB: 14-Feb-1935 Sex: male    HISTORY OF PRESENT ILLNESS:   Logan French is a 89 y.o. male who is status post intravascular lithotripsy of the right above-knee popliteal artery and DCB by Dr. Pearline on 05/17/2023.  I performed shockwave intra-arterial lithotripsy of the left popliteal artery on 07/20/2023.  These were done for combined venous and arterial wounds.  He has been followed by Dr. Harden for his wound care.  Recently he has noted worsening pain in both legs but the ulcers appear to be more ischemic on the left.  He was sent for Doppler studies in our office last week.  I spoke with his wife on the phone tonight regarding his situation.  CURRENT MEDICATIONS:    Current Outpatient Medications  Medication Sig Dispense Refill   acetaminophen  (TYLENOL ) 325 MG tablet Take 2 tablets (650 mg total) by mouth every 4 (four) hours as needed for headache or mild pain (pain score 1-3).     clopidogrel  (PLAVIX ) 75 MG tablet Take 1 tablet (75 mg total) by mouth daily. 30 tablet 0   colchicine  0.6 MG tablet Take 1 tablet (0.6 mg total) by mouth daily. 30 tablet 0   collagenase  (SANTYL ) 250 UNIT/GM ointment Apply topically daily. Apply to affected area as directed 90 each 0   cyanocobalamin  (VITAMIN B12) 1000 MCG tablet Take 0.5 tablets (500 mcg total) by mouth daily. 15 tablet 0   diclofenac  Sodium (VOLTAREN ) 1 % GEL Apply 2 g topically 4 (four) times daily. 100 g 0   doxycycline  (VIBRA -TABS) 100 MG tablet Take 1 tablet (100 mg total) by mouth every 12 (twelve) hours. 6 tablet 0   ezetimibe  (ZETIA ) 10 MG tablet Take 1 tablet (10 mg total) by mouth daily. 30 tablet 0   finasteride  (PROSCAR ) 5 MG tablet Take 1 tablet (5 mg total) by mouth daily. 30 tablet 0   hydrochlorothiazide  (HYDRODIURIL ) 12.5 MG tablet Take 1 tablet (12.5 mg total) by mouth daily. 30 tablet 0   metFORMIN  (GLUCOPHAGE ) 500 MG tablet Take 1 tablet  (500 mg total) by mouth daily with breakfast. 30 tablet 0   metoprolol  succinate (TOPROL -XL) 25 MG 24 hr tablet Take 0.5 tablets (12.5 mg total) by mouth every 12 (twelve) hours. 30 tablet 0   oxyCODONE  (OXY IR/ROXICODONE ) 5 MG immediate release tablet Take 1 tablet (5 mg total) by mouth every 6 (six) hours as needed for severe pain (pain score 7-10). 30 tablet 0   pantoprazole  (PROTONIX ) 40 MG tablet Take 1 tablet (40 mg total) by mouth daily. 30 tablet 0   rosuvastatin  (CRESTOR ) 20 MG tablet Take 1 tablet (20 mg total) by mouth daily. 30 tablet 0   STUDY - LIBREXIA-AF - apixaban  5 mg or placebo capsule (PI-Sethi) Take 1 capsule (5 mg total) by mouth 2 (two) times daily. Take at approximately the same time of day with or without food. Please bring bottle back with you to every visit; do not discard bottle. For Investigational Use Only. 420 capsule 0   STUDY - LIBREXIA-AF - [id number] (Milvexian) 100 mg or placebo tablet (PI-Sethi) Take 1 tablet by mouth 2 (two) times daily. Take at approximately the same time of day with or without food. Please bring bottle back with you to every visit; do not discard bottle. For Investigational Use Only. 420 tablet 0   Triamcinolone  Acetonide (TRIAMCINOLONE  0.1 % CREAM : EUCERIN) CREA Apply 1 Application topically 2 (two) times daily. 1  each 0   vitamin D3 (CHOLECALCIFEROL ) 25 MCG tablet Take 1 tablet (1,000 Units total) by mouth daily. 30 tablet 0   No current facility-administered medications for this visit.    STUDIES:   ABIPrevious TBI  +-------+-----------+-----------+------------+------------+  Right 1.05       0.73       10.5        0.98          +-------+-----------+-----------+------------+------------+  Left  0.89       0.66       1.09        0.77          +-------+-------   Venous: Left:  - No evidence of deep vein thrombosis seen in the left lower extremity,  from the common femoral through the popliteal veins.  - No evidence  of superficial venous thrombosis in the left lower  extremity.  - Venous reflux is noted in the left sapheno-femoral junction.    MEDICAL ISSUES:   Bilateral lower extremity ulcers: The patient's wife contacted me tonight regarding his change in clinical status.  I have reviewed Dr. Crist note which suggests that the left leg ulcers have become more ischemic looking.  Clinically, he has having a worsening appearance of the ulcers and worsening pain.  Doppler studies suggested a decrease in ABI and waveform appearance on the left.  Because of his deterioration I think that repeat angiography is indicated to evaluate the repair of both legs and to intervene on the left leg if indicated.  I we will have my office reach out to him tomorrow and get him scheduled for angiography, likely on Wednesday.  Malvina Serene CLORE, MD, FACS [facility] [phone] Pager [phone]

## 2023-10-26 ENCOUNTER — Other Ambulatory Visit (HOSPITAL_COMMUNITY): Payer: Self-pay | Admitting: *Deleted

## 2023-10-26 DIAGNOSIS — L97302 Non-pressure chronic ulcer of unspecified ankle with fat layer exposed: Secondary | ICD-10-CM

## 2023-10-26 DIAGNOSIS — L03116 Cellulitis of left lower limb: Secondary | ICD-10-CM | POA: Diagnosis not present

## 2023-10-27 ENCOUNTER — Ambulatory Visit (HOSPITAL_COMMUNITY)
Admission: RE | Admit: 2023-10-27 | Discharge: 2023-10-27 | Disposition: A | Discharge status: Home / Self Care | Attending: Vascular Surgery | Admitting: Vascular Surgery

## 2023-10-27 ENCOUNTER — Other Ambulatory Visit: Payer: Self-pay

## 2023-10-27 ENCOUNTER — Encounter (HOSPITAL_COMMUNITY)
Admission: RE | Disposition: A | Discharge status: Home / Self Care | Payer: Self-pay | Source: Home / Self Care | Attending: Vascular Surgery

## 2023-10-27 DIAGNOSIS — L97519 Non-pressure chronic ulcer of other part of right foot with unspecified severity: Secondary | ICD-10-CM | POA: Insufficient documentation

## 2023-10-27 DIAGNOSIS — Z7982 Long term (current) use of aspirin: Secondary | ICD-10-CM | POA: Insufficient documentation

## 2023-10-27 DIAGNOSIS — S81801A Unspecified open wound, right lower leg, initial encounter: Secondary | ICD-10-CM | POA: Diagnosis not present

## 2023-10-27 DIAGNOSIS — Z7902 Long term (current) use of antithrombotics/antiplatelets: Secondary | ICD-10-CM | POA: Diagnosis not present

## 2023-10-27 DIAGNOSIS — Z79899 Other long term (current) drug therapy: Secondary | ICD-10-CM | POA: Insufficient documentation

## 2023-10-27 DIAGNOSIS — I70245 Atherosclerosis of native arteries of left leg with ulceration of other part of foot: Secondary | ICD-10-CM | POA: Insufficient documentation

## 2023-10-27 DIAGNOSIS — L97529 Non-pressure chronic ulcer of other part of left foot with unspecified severity: Secondary | ICD-10-CM | POA: Insufficient documentation

## 2023-10-27 DIAGNOSIS — I70223 Atherosclerosis of native arteries of extremities with rest pain, bilateral legs: Secondary | ICD-10-CM

## 2023-10-27 DIAGNOSIS — I70235 Atherosclerosis of native arteries of right leg with ulceration of other part of foot: Secondary | ICD-10-CM | POA: Diagnosis not present

## 2023-10-27 DIAGNOSIS — S81802A Unspecified open wound, left lower leg, initial encounter: Secondary | ICD-10-CM

## 2023-10-27 DIAGNOSIS — Z9889 Other specified postprocedural states: Secondary | ICD-10-CM | POA: Diagnosis not present

## 2023-10-27 DIAGNOSIS — I700 Atherosclerosis of aorta: Secondary | ICD-10-CM

## 2023-10-27 DIAGNOSIS — L97302 Non-pressure chronic ulcer of unspecified ankle with fat layer exposed: Secondary | ICD-10-CM

## 2023-10-27 HISTORY — PX: ABDOMINAL AORTOGRAM W/LOWER EXTREMITY: CATH118223

## 2023-10-27 LAB — POCT I-STAT, CHEM 8
BUN: 26 mg/dL — ABNORMAL HIGH (ref 8–23)
Calcium, Ion: 1.17 mmol/L (ref 1.15–1.40)
Chloride: 104 mmol/L (ref 98–111)
Creatinine, Ser: 1.3 mg/dL — ABNORMAL HIGH (ref 0.61–1.24)
Glucose, Bld: 105 mg/dL — ABNORMAL HIGH (ref 70–99)
HCT: 34 % — ABNORMAL LOW (ref 39.0–52.0)
Hemoglobin: 11.6 g/dL — ABNORMAL LOW (ref 13.0–17.0)
Potassium: 4 mmol/L (ref 3.5–5.1)
Sodium: 139 mmol/L (ref 135–145)
TCO2: 24 mmol/L (ref 22–32)

## 2023-10-27 SURGERY — ABDOMINAL AORTOGRAM W/LOWER EXTREMITY
Anesthesia: LOCAL

## 2023-10-27 MED ORDER — SODIUM CHLORIDE 0.9 % IV SOLN
250.0000 mL | INTRAVENOUS | Status: DC | PRN
Start: 2023-10-27 — End: 2023-10-27

## 2023-10-27 MED ORDER — ONDANSETRON HCL 4 MG/2ML IJ SOLN: 4.0000 mg | Freq: Four times a day (QID) | INTRAMUSCULAR | Status: DC | PRN

## 2023-10-27 MED ORDER — FENTANYL CITRATE (PF) 100 MCG/2ML IJ SOLN
INTRAMUSCULAR | Status: AC
  Filled 2023-10-27: qty 2

## 2023-10-27 MED ORDER — FENTANYL CITRATE (PF) 100 MCG/2ML IJ SOLN
INTRAMUSCULAR | Status: DC | PRN
  Administered 2023-10-27 (×2): 25 ug via INTRAVENOUS

## 2023-10-27 MED ORDER — HEPARIN (PORCINE) IN NACL 1000-0.9 UT/500ML-% IV SOLN
INTRAVENOUS | Status: DC | PRN
  Administered 2023-10-27 (×4): 500 mL

## 2023-10-27 MED ORDER — LIDOCAINE HCL (PF) 1 % IJ SOLN
INTRAMUSCULAR | Status: AC
  Filled 2023-10-27: qty 30

## 2023-10-27 MED ORDER — SODIUM CHLORIDE 0.9 % WEIGHT BASED INFUSION: 1.0000 mL/kg/h | INTRAVENOUS | Status: DC

## 2023-10-27 MED ORDER — MIDAZOLAM HCL 2 MG/2ML IJ SOLN
INTRAMUSCULAR | Status: AC
  Filled 2023-10-27: qty 2

## 2023-10-27 MED ORDER — LABETALOL HCL 5 MG/ML IV SOLN: 10.0000 mg | INTRAVENOUS | Status: DC | PRN

## 2023-10-27 MED ORDER — SODIUM CHLORIDE 0.9% FLUSH: 3.0000 mL | Freq: Two times a day (BID) | INTRAVENOUS | Status: DC

## 2023-10-27 MED ORDER — SODIUM CHLORIDE 0.9 % IV SOLN: INTRAVENOUS | Status: DC

## 2023-10-27 MED ORDER — LIDOCAINE HCL (PF) 1 % IJ SOLN
INTRAMUSCULAR | Status: DC | PRN
  Administered 2023-10-27 (×2): 5 mL

## 2023-10-27 MED ORDER — SODIUM CHLORIDE 0.9% FLUSH: 3.0000 mL | INTRAVENOUS | Status: DC | PRN

## 2023-10-27 MED ORDER — ACETAMINOPHEN 325 MG PO TABS
650.0000 mg | ORAL_TABLET | ORAL | Status: DC | PRN
Start: 2023-10-27 — End: 2023-10-27

## 2023-10-27 MED ORDER — HYDRALAZINE HCL 20 MG/ML IJ SOLN: 5.0000 mg | INTRAMUSCULAR | Status: DC | PRN

## 2023-10-27 MED ORDER — MIDAZOLAM HCL 2 MG/2ML IJ SOLN
INTRAMUSCULAR | Status: DC | PRN
  Administered 2023-10-27 (×2): .5 mg via INTRAVENOUS

## 2023-10-27 MED ORDER — IODIXANOL 320 MG/ML IV SOLN
INTRAVENOUS | Status: DC | PRN
  Administered 2023-10-27 (×2): 70 mL

## 2023-10-27 SURGICAL SUPPLY — 11 items
CATH OMNI FLUSH 5F 65CM (CATHETERS) IMPLANT
DEVICE CLOSURE MYNXGRIP 5F (Vascular Products) IMPLANT
GLIDEWIRE ADV .035X260CM (WIRE) IMPLANT
KIT MICROPUNCTURE NIT STIFF (SHEATH) IMPLANT
KIT SINGLE USE MANIFOLD (KITS) IMPLANT
KIT SYRINGE INJ CVI SPIKEX1 (MISCELLANEOUS) IMPLANT
SET ATX-X65L (MISCELLANEOUS) IMPLANT
SHEATH PINNACLE 5F 10CM (SHEATH) IMPLANT
SHEATH PROBE COVER 6X72 (BAG) IMPLANT
TRAY PV CATH (CUSTOM PROCEDURE TRAY) ×1 IMPLANT
WIRE BENTSON .035X145CM (WIRE) IMPLANT

## 2023-10-27 NOTE — Discharge Instructions (Signed)
 NO METFORMIN  FOR 2 DAYS

## 2023-10-27 NOTE — Op Note (Signed)
    Patient name: Logan French MRN: [id number] DOB: September 26, 1934 Sex: male  10/27/2023 Pre-operative Diagnosis: Bilateral lower limb ischemia with tissue loss Post-operative diagnosis:  Same Surgeon:  Fonda FORBES Rim, MD Procedure Performed: 1.  Ultrasound-guided micropuncture access of the right common femoral artery in retrograde fashion 2.  Aortogram 3.  Second-order cannulation, left lower extremity angiogram 4.  Right lower extremity angiogram 5.  Moderate sedation time 22 minutes, contrast volume 70 mL 6.  Access managed with a minx device   Indications: Patient is an 89 year old male with history of peripheral arterial disease having previously undergone interventions to bilateral lower extremities.  He continues to have wounds bilaterally, and has worsening wounds on the left lower extremity.  After discussing the risks and benefits of repeat angiography in an effort to ensure lower extremity perfusion is optimized, [name] elected to proceed.  Findings:   Aortogram: Severe atherosclerotic disease throughout, however no flow-limiting stenosis in the bilateral renal arteries, widely patent infrarenal aorta. No flow-limiting stenosis in the aortoiliac segments bilaterally.  On the left: There is diffuse atherosclerotic disease throughout.  Widely patent common femoral artery, profunda, superficial femoral artery.  Widely patent popliteal artery.  Single-vessel peroneal artery outflow to the foot filling the distal posterior tibial artery and dorsalis pedis through medial and lateral perforators respectively.  Posterior tibial artery and anterior tibial artery with flush occlusion proximally.  On the right: There is diffuse atherosclerotic disease throughout.  Widely patent common femoral artery, profunda, superficial femoral artery.  Widely patent profunda.  Two-vessel anterior tibial, peroneal artery outflow to the foot continuing through the dorsalis pedis.  Plantar arteries not  appreciated.   Procedure:  The patient was identified in the holding area and taken to room 8.  The patient was then placed supine on the table and prepped and draped in the usual sterile fashion.  A time out was called.  Ultrasound was used to evaluate the right common femoral artery.  It was patent .  A digital ultrasound image was acquired.  A micropuncture needle was used to access the right common femoral artery under ultrasound guidance.  An 018 wire was advanced without resistance and a micropuncture sheath was placed.  The 018 wire was removed and a benson wire was placed.  The micropuncture sheath was exchanged for a 5 french sheath.  An omniflush catheter was advanced over the wire to the level of L-1.  An abdominal angiogram was obtained.  Next, using the omniflush catheter and a benson wire, the aortic bifurcation was crossed and the catheter was placed into theleft external iliac artery and left runoff was obtained.  right runoff was performed via retrograde sheath injections. See results above.  Patient with single-vessel runoff on the left, two-vessel runoff on the right.  No intervenable lesions.  Maximally revascularized.    Fonda FORBES Rim MD [facility] Office: [phone]

## 2023-10-27 NOTE — Progress Notes (Signed)
 Up and walked and tolerated well; right groin stable, no bleeding or hematoma

## 2023-10-28 ENCOUNTER — Encounter (HOSPITAL_COMMUNITY): Payer: Self-pay | Admitting: Vascular Surgery

## 2023-10-28 NOTE — H&P (Signed)
 Patient seen and examined in preop holding.  No complaints. No changes to medication history however wounds, especially wounds on the left lower extremity have continued to worsen. After discussing the risks and benefits of bilateral lower EXTR angiography with emphasis on the left in an effort to ensure he is maximally revascularized, Logan French elected to proceed.   Fonda FORBES Rim MD    Patient ID: Logan French, male   DOB: Apr 06, 1934, 89 y.o.   MRN: [id number]  Reason for Consult: No chief complaint on file.   Referred by No ref. provider found  Subjective:     HPI Logan French is a 89 y.o. male with PAD presenting for follow-up.  He recently underwent left lower extremity angiogram with shockwave lithotripsy of the left popliteal artery on Jul 20, 2023 with Dr. Serene due to foot wounds. He also underwent shockwave lithotripsy of the right popliteal artery and distal SFA with drug-coated balloon angioplasty of the segments in March for slow healing wounds.  Today he reports his posterior ankle wounds have been healing.  He denies any foot pain and reports that his swelling has improved as well and was able to get his normal shoes on.  He has had some improvement in his walking as well.  Past Medical History:  Diagnosis Date   A-fib (HCC)    B12 deficiency    Diverticulosis    Fatigue    History of colonic polyps    Hyperlipemia    Hypertension    Peripheral vascular disease (HCC)    Pernicious anemia    Prostate cancer (HCC)    Syncope    Syncope    Family History  Problem Relation Age of Onset   Stroke Neg Hx    Transient ischemic attack Neg Hx    Past Surgical History:  Procedure Laterality Date   ABDOMINAL AORTOGRAM N/A 07/20/2023   Procedure: ABDOMINAL AORTOGRAM;  Surgeon: Serene Gaile ORN, MD;  Location: [facility];  Service: Cardiovascular;  Laterality: N/A;   ABDOMINAL AORTOGRAM W/LOWER EXTREMITY N/A 05/17/2023   Procedure: ABDOMINAL  AORTOGRAM W/LOWER EXTREMITY;  Surgeon: Pearline Norman RAMAN, MD;  Location: [facility] INVASIVE CV LAB;  Service: Cardiovascular;  Laterality: N/A;   ABDOMINAL AORTOGRAM W/LOWER EXTREMITY N/A 10/27/2023   Procedure: ABDOMINAL AORTOGRAM W/LOWER EXTREMITY;  Surgeon: Rim Fonda FORBES, MD;  Location: [facility] INVASIVE CV LAB;  Service: Cardiovascular;  Laterality: N/A;   CARDIOVERSION N/A 10/07/2021   Procedure: CARDIOVERSION;  Surgeon: Sheena Pugh, DO;  Location: [facility];  Service: Cardiovascular;  Laterality: N/A;   IR ANGIO INTRA EXTRACRAN SEL COM CAROTID INNOMINATE BILAT MOD SED  09/10/2017   IR ANGIO VERTEBRAL SEL SUBCLAVIAN INNOMINATE BILAT MOD SED  09/10/2017   LOWER EXTREMITY ANGIOGRAPHY N/A 07/20/2023   Procedure: Lower Extremity Angiography;  Surgeon: Serene Gaile ORN, MD;  Location: [facility];  Service: Cardiovascular;  Laterality: N/A;   LOWER EXTREMITY INTERVENTION Left 07/20/2023   Procedure: LOWER EXTREMITY INTERVENTION;  Surgeon: Serene Gaile ORN, MD;  Location: [facility];  Service: Cardiovascular;  Laterality: Left;   PERIPHERAL INTRAVASCULAR LITHOTRIPSY Right 05/17/2023   Procedure: PERIPHERAL INTRAVASCULAR LITHOTRIPSY;  Surgeon: Pearline Norman RAMAN, MD;  Location: [facility] INVASIVE CV LAB;  Service: Cardiovascular;  Laterality: Right;  SFA-POP   PERIPHERAL INTRAVASCULAR LITHOTRIPSY Left 07/20/2023   Procedure: PERIPHERAL INTRAVASCULAR LITHOTRIPSY;  Surgeon: Serene Gaile ORN, MD;  [facility]: [facility];  Service: Cardiovascular;  Laterality: Left;   PERIPHERAL VASCULAR BALLOON ANGIOPLASTY  Right 05/17/2023   Procedure: PERIPHERAL VASCULAR BALLOON ANGIOPLASTY;  Surgeon: Pearline Norman RAMAN, MD;  Location: [facility] INVASIVE CV LAB;  Service: Cardiovascular;  Laterality: Right;  SFA-POP    Short Social History:  Social History   Tobacco Use   Smoking status: Never   Smokeless tobacco: Never   Tobacco comments:    Never smoke 09/23/21  Substance Use Topics   Alcohol  use: Yes    Alcohol /week: 2.0  standard drinks of alcohol     Types: 1 Cans of beer, 1 Shots of liquor per week    Comment: occasionally    No Known Allergies  No current facility-administered medications for this encounter.   Current Outpatient Medications  Medication Sig Dispense Refill   acetaminophen  (TYLENOL ) 325 MG tablet Take 2 tablets (650 mg total) by mouth every 4 (four) hours as needed for headache or mild pain (pain score 1-3). (Patient taking differently: Take 1,000 mg by mouth every 4 (four) hours as needed for headache or mild pain (pain score 1-3). 500 mg)     clopidogrel  (PLAVIX ) 75 MG tablet Take 1 tablet (75 mg total) by mouth daily. 30 tablet 0   colchicine  0.6 MG tablet Take 1 tablet (0.6 mg total) by mouth daily. 30 tablet 0   collagenase  (SANTYL ) 250 UNIT/GM ointment Apply topically daily. Apply to affected area as directed 90 each 0   cyanocobalamin  (VITAMIN B12) 1000 MCG tablet Take 0.5 tablets (500 mcg total) by mouth daily. 15 tablet 0   ezetimibe  (ZETIA ) 10 MG tablet Take 1 tablet (10 mg total) by mouth daily. 30 tablet 0   finasteride  (PROSCAR ) 5 MG tablet Take 1 tablet (5 mg total) by mouth daily. 30 tablet 0   furosemide  (LASIX ) 20 MG tablet Take 20 mg by mouth daily as needed for fluid or edema.     hydrochlorothiazide  (HYDRODIURIL ) 12.5 MG tablet Take 1 tablet (12.5 mg total) by mouth daily. 30 tablet 0   metFORMIN  (GLUCOPHAGE ) 500 MG tablet Take 1 tablet (500 mg total) by mouth daily with breakfast. 30 tablet 0   metoprolol  succinate (TOPROL -XL) 25 MG 24 hr tablet Take 0.5 tablets (12.5 mg total) by mouth every 12 (twelve) hours. 30 tablet 0   oxyCODONE  (OXY IR/ROXICODONE ) 5 MG immediate release tablet Take 1 tablet (5 mg total) by mouth every 6 (six) hours as needed for severe pain (pain score 7-10). 30 tablet 0   pantoprazole  (PROTONIX ) 40 MG tablet Take 1 tablet (40 mg total) by mouth daily. 30 tablet 0   potassium chloride  SA (KLOR-CON  M) 20 MEQ tablet Take 20 mEq by mouth daily as  needed (Fluid).     rosuvastatin  (CRESTOR ) 20 MG tablet Take 1 tablet (20 mg total) by mouth daily. 30 tablet 0   STUDY - LIBREXIA-AF - apixaban  5 mg or placebo capsule (PI-Sethi) Take 1 capsule (5 mg total) by mouth 2 (two) times daily. Take at approximately the same time of day with or without food. Please bring bottle back with you to every visit; do not discard bottle. For Investigational Use Only. 420 capsule 0   STUDY - LIBREXIA-AF - [id number] (Milvexian) 100 mg or placebo tablet (PI-Sethi) Take 1 tablet by mouth 2 (two) times daily. Take at approximately the same time of day with or without food. Please bring bottle back with you to every visit; do not discard bottle. For Investigational Use Only. 420 tablet 0   Triamcinolone  Acetonide (TRIAMCINOLONE  0.1 % CREAM : EUCERIN) CREA Apply 1 Application topically 2 (  two) times daily. 1 each 0   vitamin D3 (CHOLECALCIFEROL ) 25 MCG tablet Take 1 tablet (1,000 Units total) by mouth daily. 30 tablet 0   diclofenac  Sodium (VOLTAREN ) 1 % GEL Apply 2 g topically 4 (four) times daily. (Patient taking differently: Apply 2 g topically daily as needed (pain).) 100 g 0    REVIEW OF SYSTEMS  All other systems were reviewed and are negative     Objective:  Objective   Vitals:   10/27/23 0949 10/27/23 1003 10/27/23 1018 10/27/23 1152  BP: (!) 140/74 (!) 140/80 (!) 154/79 121/81  Pulse: 68 67 68 69  Resp:      Temp:      SpO2: 90% 94% 100% 99%  Weight:      Height:       Body mass index is 29.16 kg/m.  Physical Exam General: no acute distress Cardiac: hemodynamically stable Pulm: normal work of breathing Neuro: alert, no focal deficit Extremities: Mild 1+ edema from left foot to mid shin, bilateral posterior calf wounds present.  On the right is almost completely healed and is epithelialized.  On the left there is some granulation tissue and some fibrinous buildup. Vascular:   Right: Palpable DP  Left: Palpable  DP  Data: ABI +---------+------------------+-----+---------+--------+  Right   Rt Pressure (mmHg)IndexWaveform Comment   +---------+------------------+-----+---------+--------+  Brachial 129                    biphasic           +---------+------------------+-----+---------+--------+  ATA     135               1.05 triphasic          +---------+------------------+-----+---------+--------+  PTA     127               0.98 biphasic           +---------+------------------+-----+---------+--------+  Great Toe126               0.98 Normal             +---------+------------------+-----+---------+--------+   +---------+------------------+-----+--------+-------+  Left    Lt Pressure (mmHg)IndexWaveformComment  +---------+------------------+-----+--------+-------+  Brachial 118                    biphasic         +---------+------------------+-----+--------+-------+  ATA     141               1.09 biphasic         +---------+------------------+-----+--------+-------+  PTA     111               0.86 biphasic         +---------+------------------+-----+--------+-------+  Great Toe99                0.77 Abnormal         +---------+------------------+-----+--------+-------+   +-------+-----------+-----------+------------+------------+  ABI/TBIToday's ABIToday's TBIPrevious ABIPrevious TBI  +-------+-----------+-----------+------------+------------+  Right 1.05       0.98       not found   0.62          +-------+-----------+-----------+------------+------------+  Left  1.09       0.77       0.98        0.35          +-------+-----------+-----------+------------+------------+    Duplex +-----------+--------+-----+---------------+----------+--------+  RIGHT     PSV cm/sRatioStenosis       Waveform  Comments  +-----------+--------+-----+---------------+----------+--------+  CFA Distal 69                           triphasic           +-----------+--------+-----+---------------+----------+--------+  DFA       82                          biphasic            +-----------+--------+-----+---------------+----------+--------+  SFA Prox   82                          triphasic           +-----------+--------+-----+---------------+----------+--------+  SFA Mid    129                         triphasic           +-----------+--------+-----+---------------+----------+--------+  SFA Distal 176          30-49% stenosistriphasic           +-----------+--------+-----+---------------+----------+--------+  POP Prox   92                          triphasic           +-----------+--------+-----+---------------+----------+--------+  POP Distal 94                          triphasic           +-----------+--------+-----+---------------+----------+--------+  TP Trunk   103                         triphasic           +-----------+--------+-----+---------------+----------+--------+  ATA Distal 18                                              +-----------+--------+-----+---------------+----------+--------+  PTA Distal 26                          monophasic          +-----------+--------+-----+---------------+----------+--------+  PERO Distal86                          biphasic            +-----------+--------+-----+---------------+----------+--------+   +-----------+--------+-----+--------+----------+--------+  LEFT      PSV cm/sRatioStenosisWaveform  Comments  +-----------+--------+-----+--------+----------+--------+  CFA Distal 116                  biphasic            +-----------+--------+-----+--------+----------+--------+  DFA       52                   biphasic            +-----------+--------+-----+--------+----------+--------+  SFA Prox   101                  biphasic             +-----------+--------+-----+--------+----------+--------+  SFA Mid    118  biphasic            +-----------+--------+-----+--------+----------+--------+  SFA Distal 106                  biphasic            +-----------+--------+-----+--------+----------+--------+  POP Prox   92                   biphasic            +-----------+--------+-----+--------+----------+--------+  POP Distal 57                   biphasic            +-----------+--------+-----+--------+----------+--------+  TP Trunk   90                   biphasic            +-----------+--------+-----+--------+----------+--------+  ATA Distal 133                  biphasic            +-----------+--------+-----+--------+----------+--------+  PTA Distal 26                   monophasic          +-----------+--------+-----+--------+----------+--------+  PERO Distal             occluded                    +-----------+--------+-----+--------+----------+--------+      Assessment/Plan:   Logan French is a 89 y.o. male with PAD who has undergone bilateral interventions with popliteal intravascular lithotripsy and drug-coated balloon angioplasty. His wounds are healing.  He is going to the beach in a few weeks I just explained to keep them protected with gauze and either a wrap or socks.  Instructed to not go in the ocean with open wounds.  Continue aspirin , Plavix , statin Will plan to follow-up in 3 months with repeat ABI and bilateral duplex.      [facility]

## 2023-10-29 DIAGNOSIS — L03116 Cellulitis of left lower limb: Secondary | ICD-10-CM | POA: Diagnosis not present

## 2023-11-01 ENCOUNTER — Ambulatory Visit (HOSPITAL_COMMUNITY)
Admission: RE | Admit: 2023-11-01 | Discharge: 2023-11-01 | Disposition: A | Discharge status: Home / Self Care | Source: Ambulatory Visit | Attending: Neurology | Admitting: Neurology

## 2023-11-01 DIAGNOSIS — G459 Transient cerebral ischemic attack, unspecified: Secondary | ICD-10-CM | POA: Insufficient documentation

## 2023-11-01 DIAGNOSIS — I779 Disorder of arteries and arterioles, unspecified: Secondary | ICD-10-CM

## 2023-11-02 ENCOUNTER — Other Ambulatory Visit: Payer: Self-pay | Admitting: Cardiology

## 2023-11-02 DIAGNOSIS — L03116 Cellulitis of left lower limb: Secondary | ICD-10-CM | POA: Diagnosis not present

## 2023-11-04 ENCOUNTER — Other Ambulatory Visit: Payer: Self-pay

## 2023-11-04 DIAGNOSIS — L97919 Non-pressure chronic ulcer of unspecified part of right lower leg with unspecified severity: Secondary | ICD-10-CM | POA: Diagnosis not present

## 2023-11-04 DIAGNOSIS — I10 Essential (primary) hypertension: Secondary | ICD-10-CM | POA: Diagnosis not present

## 2023-11-04 DIAGNOSIS — E785 Hyperlipidemia, unspecified: Secondary | ICD-10-CM | POA: Diagnosis not present

## 2023-11-04 DIAGNOSIS — R82998 Other abnormal findings in urine: Secondary | ICD-10-CM | POA: Diagnosis not present

## 2023-11-04 DIAGNOSIS — C61 Malignant neoplasm of prostate: Secondary | ICD-10-CM | POA: Diagnosis not present

## 2023-11-04 DIAGNOSIS — Z1339 Encounter for screening examination for other mental health and behavioral disorders: Secondary | ICD-10-CM | POA: Diagnosis not present

## 2023-11-04 DIAGNOSIS — D649 Anemia, unspecified: Secondary | ICD-10-CM | POA: Diagnosis not present

## 2023-11-04 DIAGNOSIS — Z1331 Encounter for screening for depression: Secondary | ICD-10-CM | POA: Diagnosis not present

## 2023-11-04 DIAGNOSIS — I739 Peripheral vascular disease, unspecified: Secondary | ICD-10-CM | POA: Diagnosis not present

## 2023-11-04 DIAGNOSIS — I480 Paroxysmal atrial fibrillation: Secondary | ICD-10-CM | POA: Diagnosis not present

## 2023-11-04 DIAGNOSIS — Z0000 Encounter for general adult medical examination without abnormal findings: Secondary | ICD-10-CM | POA: Diagnosis not present

## 2023-11-04 DIAGNOSIS — D510 Vitamin B12 deficiency anemia due to intrinsic factor deficiency: Secondary | ICD-10-CM | POA: Diagnosis not present

## 2023-11-04 MED ORDER — HYDROCHLOROTHIAZIDE 12.5 MG PO TABS: 12.5000 mg | ORAL_TABLET | Freq: Every day | ORAL | 3 refills | Status: DC

## 2023-11-05 DIAGNOSIS — L03116 Cellulitis of left lower limb: Secondary | ICD-10-CM | POA: Diagnosis not present

## 2023-11-08 ENCOUNTER — Telehealth: Payer: Self-pay | Admitting: Cardiology

## 2023-11-08 MED ORDER — HYDROCHLOROTHIAZIDE 12.5 MG PO CAPS: 12.5000 mg | ORAL_CAPSULE | Freq: Every day | ORAL | 3 refills | Status: AC

## 2023-11-08 NOTE — Telephone Encounter (Signed)
.  Pt c/o medication issue:  1. Name of Medication:   Take 20 mg by mouth daily as needed for fluid or edema.    hydrochlorothiazide  (HYDRODIURIL ) 12.5 MG tablet    2. How are you currently taking this medication (dosage and times per day)? As written  3. Are you having a reaction (difficulty breathing--STAT)? no  4. What is your medication issue? Script was sent in for tablets but pt wants capsules

## 2023-11-08 NOTE — Telephone Encounter (Signed)
 Pt asked that hydrochlorothiazide  be changed to capsule instead of tablet.   RX sent; med list updated

## 2023-11-09 ENCOUNTER — Ambulatory Visit (INDEPENDENT_AMBULATORY_CARE_PROVIDER_SITE_OTHER): Admitting: Orthopedic Surgery

## 2023-11-09 ENCOUNTER — Encounter: Payer: Self-pay | Admitting: Orthopedic Surgery

## 2023-11-09 DIAGNOSIS — I890 Lymphedema, not elsewhere classified: Secondary | ICD-10-CM

## 2023-11-09 DIAGNOSIS — I739 Peripheral vascular disease, unspecified: Secondary | ICD-10-CM

## 2023-11-09 DIAGNOSIS — I87331 Chronic venous hypertension (idiopathic) with ulcer and inflammation of right lower extremity: Secondary | ICD-10-CM

## 2023-11-09 DIAGNOSIS — L97521 Non-pressure chronic ulcer of other part of left foot limited to breakdown of skin: Secondary | ICD-10-CM | POA: Diagnosis not present

## 2023-11-09 DIAGNOSIS — L03115 Cellulitis of right lower limb: Secondary | ICD-10-CM

## 2023-11-09 DIAGNOSIS — I87319 Chronic venous hypertension (idiopathic) with ulcer of unspecified lower extremity: Secondary | ICD-10-CM

## 2023-11-09 NOTE — Progress Notes (Signed)
 Office Visit Note   Patient: Logan French           Date of Birth: 1934/08/17           MRN: [id number] Visit Date: 11/09/2023              Requested by: Yolande Toribio MATSU, MD [address] [place],  [place] [address] PCP: Yolande Toribio MATSU, MD  Chief Complaint  Patient presents with   Right Leg - Wound Check   Left Leg - Wound Check      HPI: Discussed the use of AI scribe software for clinical note transcription with the patient, who gave verbal consent to proceed.  History of Present Illness   Patient presents in follow-up for large lymphedema ulcers both lower extremities.  Assessment & Plan: Visit Diagnoses:  1. PAD (peripheral artery disease) (HCC)   2. 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 Lymphedema   4. Chronic venous hypertension (idiopathic) with ulcer and inflammation of right lower extremity (HCC)   5. Non-pressure chronic ulcer of other part of left foot limited to breakdown of skin (HCC)   6. Cellulitis of right lower leg     Plan: Assessment and Plan Assessment & Plan   Discussed the importance of nutrition supplements including protein supplements twice a day with L arginine supplement as well.  Recommended using the Vive wear compression socks daily around-the-clock.  Recommended changing them 2-3 times a day early on due to the increased weeping edema for both lower extremities.  Discussed the importance of keeping his legs elevated above his heart at all times except while walking and exercising.  Discussed that patient is at risk of limb loss and if we are not able to be compliant with instructions.  Instructions are written.  Follow-up in 2 weeks.  Patient states that he will be obtaining a second opinion at a wound center in [place].  Recommended a size extra-large Vive wear sock.    Follow-Up Instructions: Return in about 2 weeks (around 11/23/2023).   Ortho Exam  Patient is alert,  oriented, no adenopathy, well-dressed, normal affect, normal respiratory effort. Physical Exam  Examination patient has significant weeping edema from both lower extremities.  The calf on the right is 49 cm in circumference calf on the left is 48 cm in circumference.  There is superficial ulceration to the right lower extremity and there is large deep necrotic ulcers to the left lower extremity with a medial malleolus ulcer 2 x 2 cm and a posterior calf ulcer that is 7 x 6 cm.  Most recent vascular intervention on August 13 showed diffuse atherosclerosis disease throughout.  The vessels are patent.  Patient has single-vessel peroneal artery outflow to the lower extremities.  The posterior tibial artery and anterior tibial artery are occluded proximally.  Examination today patient does have a strong palpable dorsalis pedis pulse on the right.    Imaging: No results found. No images are attached to the encounter.  Labs: Lab Results  Component Value Date   HGBA1C 6.2 (H) 10/06/2023   HGBA1C 6.4 (H) 06/17/2023   CRP 26.9 (H) 06/23/2023   CRP 27.5 (H) 06/22/2023   CRP 27.6 (H) 06/21/2023   LABURIC 5.0 06/22/2023   LABURIC 5.3 06/21/2023   REPTSTATUS 07/20/2023 FINAL 07/15/2023   GRAMSTAIN Rare 10/29/2015   GRAMSTAIN WBC present-predominately Mononuclear 10/29/2015   GRAMSTAIN No Squamous Epithelial Cells Seen 10/29/2015   GRAMSTAIN Moderate  Gram Positive Cocci In Pairs In Clusters 10/29/2015   CULT  07/15/2023    NO GROWTH 5 DAYS Performed at [facility], [address] [place], [place] [address]    LABORGA GROUP A STREP (S.PYOGENES) ISOLATED 06/17/2023     Lab Results  Component Value Date   ALBUMIN 2.0 (L) 08/04/2023   ALBUMIN 2.2 (L) 08/02/2023   ALBUMIN 2.1 (L) 07/28/2023    Lab Results  Component Value Date   MG 2.0 06/23/2023   MG 2.0 06/22/2023   MG 2.0 06/21/2023   Lab Results  Component Value Date   VD25OH 27.89 (L) 06/26/2023    No results found for:  PREALBUMIN    Latest Ref Rng & Units 10/27/2023    7:08 AM 08/07/2023    7:25 AM 08/06/2023    6:43 AM  CBC EXTENDED  WBC 4.0 - 10.5 K/uL  6.9  7.7   RBC 4.22 - 5.81 MIL/uL  4.16  3.92   Hemoglobin 13.0 - 17.0 g/dL 88.3  88.2  89.0   HCT 39.0 - 52.0 % 34.0  35.9  33.7   Platelets 150 - 400 K/uL  385  368   NEUT# 1.7 - 7.7 K/uL  4.5  5.1   Lymph# 0.7 - 4.0 K/uL  1.5  1.6      There is no height or weight on file to calculate BMI.  Orders:  No orders of the defined types were placed in this encounter.  No orders of the defined types were placed in this encounter.    Procedures: No procedures performed  Clinical Data: No additional findings.  ROS:  All other systems negative, except as noted in the HPI. Review of Systems  Objective: Vital Signs: There were no vitals taken for this visit.  Specialty Comments:  No specialty comments available.  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) 06/20/2023   Streptococcal bacteremia 06/18/2023   Cellulitis of right lower extremity 06/17/2023   Persistent atrial fibrillation (HCC) 09/23/2021   Hypercoagulable state due to persistent atrial fibrillation (HCC) 09/23/2021   Callus 10/25/2019   Corns and callosities 10/25/2019   Hav (hallux abducto valgus), unspecified laterality 10/25/2019   Altered awareness, transient 06/23/2017   MALLET FINGER, RIGHT RING FINGER 10/19/2008   Past Medical History:   Diagnosis Date   A-fib (HCC)    B12 deficiency    Diverticulosis    Fatigue    History of colonic polyps    Hyperlipemia    Hypertension    Peripheral vascular disease (HCC)    Pernicious anemia    Prostate cancer (HCC)    Syncope    Syncope     Family History  Problem Relation Age of Onset   Stroke Neg Hx    Transient ischemic attack Neg Hx     Past Surgical History:  Procedure Laterality Date   ABDOMINAL AORTOGRAM N/A 07/20/2023   Procedure: ABDOMINAL AORTOGRAM;  Surgeon: Serene Gaile ORN, MD;  [facility]: [facility];  Service: Cardiovascular;  Laterality: N/A;   ABDOMINAL AORTOGRAM W/LOWER EXTREMITY N/A 05/17/2023   Procedure: ABDOMINAL AORTOGRAM W/LOWER EXTREMITY;  Surgeon: Pearline Norman RAMAN, MD;  Location: [facility] INVASIVE CV LAB;  Service: Cardiovascular;  Laterality: N/A;   ABDOMINAL AORTOGRAM W/LOWER EXTREMITY N/A 10/27/2023   Procedure: ABDOMINAL AORTOGRAM W/LOWER EXTREMITY;  Surgeon: Lanis Fonda BRAVO, MD;  Location: [facility] INVASIVE CV LAB;  Service: Cardiovascular;  Laterality: N/A;   CARDIOVERSION N/A 10/07/2021   Procedure: CARDIOVERSION;  Surgeon: Sheena Pugh, DO;  Location: [facility];  Service: Cardiovascular;  Laterality: N/A;   IR ANGIO INTRA EXTRACRAN SEL COM CAROTID INNOMINATE BILAT MOD SED  09/10/2017   IR ANGIO VERTEBRAL SEL SUBCLAVIAN INNOMINATE BILAT MOD SED  09/10/2017   LOWER EXTREMITY ANGIOGRAPHY N/A 07/20/2023   Procedure: Lower Extremity Angiography;  Surgeon: Serene Gaile ORN, MD;  Location: [facility];  Service: Cardiovascular;  Laterality: N/A;   LOWER EXTREMITY INTERVENTION Left 07/20/2023   Procedure: LOWER EXTREMITY INTERVENTION;  Surgeon: Serene Gaile ORN, MD;  Location: [facility];  Service: Cardiovascular;  Laterality: Left;   PERIPHERAL INTRAVASCULAR LITHOTRIPSY Right 05/17/2023   Procedure: PERIPHERAL INTRAVASCULAR LITHOTRIPSY;  Surgeon: Pearline Norman RAMAN, MD;  Location: [facility] INVASIVE CV LAB;  Service: Cardiovascular;  Laterality: Right;   SFA-POP   PERIPHERAL INTRAVASCULAR LITHOTRIPSY Left 07/20/2023   Procedure: PERIPHERAL INTRAVASCULAR LITHOTRIPSY;  Surgeon: Serene Gaile ORN, MD;  [facility]: [facility];  Service: Cardiovascular;  Laterality: Left;   PERIPHERAL VASCULAR BALLOON ANGIOPLASTY Right 05/17/2023   Procedure: PERIPHERAL VASCULAR BALLOON ANGIOPLASTY;  Surgeon: Pearline Norman RAMAN, MD;  Location: [facility];  Service: Cardiovascular;  Laterality: Right;  SFA-POP   Social History   Occupational History   Not on file  Tobacco Use   Smoking status: Never   Smokeless tobacco: Never   Tobacco comments:    Never smoke 09/23/21  Vaping Use   Vaping status: Never Used  Substance and Sexual Activity   Alcohol  use: Yes    Alcohol /week: 2.0 standard drinks of alcohol     Types: 1 Cans of beer, 1 Shots of liquor per week    Comment: occasionally   Drug use: No   Sexual activity: Not on file

## 2023-11-16 DIAGNOSIS — L03116 Cellulitis of left lower limb: Secondary | ICD-10-CM | POA: Diagnosis not present

## 2023-11-19 ENCOUNTER — Ambulatory Visit: Admitting: Vascular Surgery

## 2023-11-19 ENCOUNTER — Encounter (HOSPITAL_COMMUNITY)

## 2023-11-19 DIAGNOSIS — I872 Venous insufficiency (chronic) (peripheral): Secondary | ICD-10-CM | POA: Diagnosis not present

## 2023-11-19 DIAGNOSIS — S81802A Unspecified open wound, left lower leg, initial encounter: Secondary | ICD-10-CM | POA: Diagnosis not present

## 2023-11-19 DIAGNOSIS — Z7902 Long term (current) use of antithrombotics/antiplatelets: Secondary | ICD-10-CM | POA: Diagnosis not present

## 2023-11-19 DIAGNOSIS — I771 Stricture of artery: Secondary | ICD-10-CM | POA: Diagnosis not present

## 2023-11-19 DIAGNOSIS — L97919 Non-pressure chronic ulcer of unspecified part of right lower leg with unspecified severity: Secondary | ICD-10-CM | POA: Diagnosis not present

## 2023-11-19 DIAGNOSIS — L97929 Non-pressure chronic ulcer of unspecified part of left lower leg with unspecified severity: Secondary | ICD-10-CM | POA: Diagnosis not present

## 2023-11-19 DIAGNOSIS — Z7984 Long term (current) use of oral hypoglycemic drugs: Secondary | ICD-10-CM | POA: Diagnosis not present

## 2023-11-19 DIAGNOSIS — R269 Unspecified abnormalities of gait and mobility: Secondary | ICD-10-CM | POA: Diagnosis not present

## 2023-11-19 DIAGNOSIS — R600 Localized edema: Secondary | ICD-10-CM | POA: Diagnosis not present

## 2023-11-19 DIAGNOSIS — Z79899 Other long term (current) drug therapy: Secondary | ICD-10-CM | POA: Diagnosis not present

## 2023-11-19 DIAGNOSIS — I739 Peripheral vascular disease, unspecified: Secondary | ICD-10-CM | POA: Diagnosis not present

## 2023-11-19 DIAGNOSIS — X58XXXA Exposure to other specified factors, initial encounter: Secondary | ICD-10-CM | POA: Diagnosis not present

## 2023-11-19 DIAGNOSIS — I890 Lymphedema, not elsewhere classified: Secondary | ICD-10-CM | POA: Diagnosis not present

## 2023-11-22 ENCOUNTER — Telehealth: Payer: Self-pay

## 2023-11-22 NOTE — Telephone Encounter (Signed)
 Noted

## 2023-11-22 NOTE — Telephone Encounter (Signed)
 Patient states last Friday afternoon went to the [facility] wound center and saw a Dr. Herschell for a second opinion. Dr. Herschell put him in two elastic compression sleeves for both legs. Dr. Herschell stated that the compression sleeves are the same compression as the one compression sock that he was wearing. The compression sleeves are much easier for the patient to work with. His legs are being cleaned and wrapped everyday per patient. Patient stated he would continue to wear these sleeves. Patient also stated that Dr. Herschell told him his notes would available for Dr. Harden to review.  He just wanted to be sure Dr. Harden is aware of this and will see Dr. Harden on Thursday.

## 2023-11-23 DIAGNOSIS — L03116 Cellulitis of left lower limb: Secondary | ICD-10-CM | POA: Diagnosis not present

## 2023-11-25 ENCOUNTER — Ambulatory Visit (INDEPENDENT_AMBULATORY_CARE_PROVIDER_SITE_OTHER): Admitting: Orthopedic Surgery

## 2023-11-25 DIAGNOSIS — L97521 Non-pressure chronic ulcer of other part of left foot limited to breakdown of skin: Secondary | ICD-10-CM

## 2023-11-25 DIAGNOSIS — I87319 Chronic venous hypertension (idiopathic) with ulcer of unspecified lower extremity: Secondary | ICD-10-CM | POA: Diagnosis not present

## 2023-11-25 DIAGNOSIS — I87331 Chronic venous hypertension (idiopathic) with ulcer and inflammation of right lower extremity: Secondary | ICD-10-CM

## 2023-11-25 DIAGNOSIS — I739 Peripheral vascular disease, unspecified: Secondary | ICD-10-CM

## 2023-11-25 DIAGNOSIS — I890 Lymphedema, not elsewhere classified: Secondary | ICD-10-CM | POA: Diagnosis not present

## 2023-11-25 NOTE — Progress Notes (Signed)
 Office Visit Note   Patient: Logan French           Date of Birth: November 03, 1934           MRN: [id number] Visit Date: 11/25/2023              Requested by: Yolande Toribio MATSU, MD [address] [place],  [place] [address] PCP: Yolande Toribio MATSU, MD  Chief Complaint  Patient presents with   Right Leg - Follow-up   Left Leg - Follow-up      HPI: Discussed the use of AI scribe software for clinical note transcription with the patient, who gave verbal consent to proceed.  History of Present Illness Logan French is an 89 year old male with chronic leg ulcers and peripheral vascular disease who presents with difficulty managing wound care and compression therapy. He is accompanied by his family.  He has chronic leg ulcers primarily affecting his left leg. Despite adherence to a prescribed wound care regimen, the ulcers have not improved. There is decreased circulation in the left leg compared to the right. He has been using silver dressings as part of his treatment.  Significant swelling in his legs has been challenging to manage with compression therapy. The compression socks have been difficult to wear due to pain, and his family has noted technical difficulties in applying them. His family assists with wound care, including washing his legs and applying prescribed lavage solutions.  He has a history of peripheral vascular disease with previous diagnostic studies indicating diffuse atherosclerotic disease and decreased arterial flow, particularly in the left leg. Ankle-brachial indices and arteriogram studies have shown compromised circulation, with a single vessel runoff to the left leg and two vessel runoff to the right leg. He has been seen by vascular specialists in the past.  His family reports that he has been focused on maintaining physical activity despite the challenges posed by his condition. He has been able to walk significant distances with the aid of a walker,  indicating a level of tolerance for physical activity.     Assessment & Plan: Visit Diagnoses: No diagnosis found.  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 Decreased circulation in left leg. Arteriogram shows single vessel perineal artery outflow with diffuse atherosclerosis. Current compression therapy ineffective due to poor arterial inflow and increased pain. - Schedule surgery for debridement and tissue grafts. - Apply negative pressure wound therapy post-surgery. - Admit for five days post-surgery for wound management and IV antibiotics. - Send tissue cultures for antibiotic guidance. - Discontinue compression therapy.  Chronic bilateral lower extremity edema Chronic edema exacerbated by venous insufficiency and poor arterial inflow. Compression therapy not tolerated due to pain and insufficient circulation. - Implement negative pressure wound therapy post-surgery. - Elevate legs when not ambulating.  Pain in bilateral lower extremities Significant pain worsened by compression therapy due to poor arterial inflow. Pain management crucial for mobility and quality of life. Surgical intervention aims to address ulcers and edema, potentially reducing pain. - Discontinue compression therapy. - Proceed with surgical intervention to address ulcers and edema.      Follow-Up Instructions: No follow-ups on file.   Ortho Exam  Patient is alert, oriented, no adenopathy, well-dressed, normal affect, normal respiratory effort. Physical Exam CARDIOVASCULAR: Strong biphasic dorsalis pedis pulse on right, damp biphasic posterior tibial pulse on right. Damp monophasic dorsalis pedis  and anterior tibial pulse on left, stronger biphasic posterior tibial pulse on left. SKIN: Dead skin peeling off back of left ankle.  Examination patient has  multiple ulcers on both legs with fibrinous exudative tissue.  Patient with too much pain to debride in the office and unable to wear compression secondary to pain from the ulceration.      Imaging: No results found. No images are attached to the encounter.  Labs: Lab Results  Component Value Date   HGBA1C 6.2 (H) 10/06/2023   HGBA1C 6.4 (H) 06/17/2023   CRP 26.9 (H) 06/23/2023   CRP 27.5 (H) 06/22/2023   CRP 27.6 (H) 06/21/2023   LABURIC 5.0 06/22/2023   LABURIC 5.3 06/21/2023   REPTSTATUS 07/20/2023 FINAL 07/15/2023   GRAMSTAIN Rare 10/29/2015   GRAMSTAIN WBC present-predominately Mononuclear 10/29/2015   GRAMSTAIN No Squamous Epithelial Cells Seen 10/29/2015   GRAMSTAIN Moderate Gram Positive Cocci In Pairs In Clusters 10/29/2015   CULT  07/15/2023    NO GROWTH 5 DAYS Performed at [facility], [address] [place], [place] [address]    LABORGA GROUP A STREP (S.PYOGENES) ISOLATED 06/17/2023     Lab Results  Component Value Date   ALBUMIN 2.0 (L) 08/04/2023   ALBUMIN 2.2 (L) 08/02/2023   ALBUMIN 2.1 (L) 07/28/2023    Lab Results  Component Value Date   MG 2.0 06/23/2023   MG 2.0 06/22/2023   MG 2.0 06/21/2023   Lab Results  Component Value Date   VD25OH 27.89 (L) 06/26/2023    No results found for: PREALBUMIN    Latest Ref Rng & Units 10/27/2023    7:08 AM 08/07/2023    7:25 AM 08/06/2023    6:43 AM  CBC EXTENDED  WBC 4.0 - 10.5 K/uL  6.9  7.7   RBC 4.22 - 5.81 MIL/uL  4.16  3.92   Hemoglobin 13.0 - 17.0 g/dL 88.3  88.2  89.0   HCT 39.0 - 52.0 % 34.0  35.9  33.7   Platelets 150 - 400 K/uL  385  368   NEUT# 1.7 - 7.7 K/uL  4.5  5.1   Lymph# 0.7 - 4.0 K/uL  1.5  1.6      There is no height or weight on file to calculate BMI.  Orders:  No orders of the defined types were placed in this encounter.  No orders of the defined types were placed in this encounter.    Procedures: No procedures performed  Clinical Data: No additional  findings.  ROS:  All other systems negative, except as noted in the HPI. Review of Systems  Objective: Vital Signs: There were no vitals taken for this visit.  Specialty Comments:  No specialty comments available.  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) 06/20/2023   Streptococcal bacteremia 06/18/2023   Cellulitis of right lower extremity 06/17/2023   Persistent atrial fibrillation (HCC) 09/23/2021   Hypercoagulable state due to persistent atrial fibrillation (HCC) 09/23/2021   Callus 10/25/2019  Corns and callosities 10/25/2019   Hav (hallux abducto valgus), unspecified laterality 10/25/2019   Altered awareness, transient 06/23/2017   MALLET FINGER, RIGHT RING FINGER 10/19/2008   Past Medical History:  Diagnosis Date   A-fib (HCC)    B12 deficiency    Diverticulosis    Fatigue    History of colonic polyps    Hyperlipemia    Hypertension    Peripheral vascular disease (HCC)    Pernicious anemia    Prostate cancer (HCC)    Syncope    Syncope     Family History  Problem Relation Age of Onset   Stroke Neg Hx    Transient ischemic attack Neg Hx     Past Surgical History:  Procedure Laterality Date   ABDOMINAL AORTOGRAM N/A 07/20/2023   Procedure: ABDOMINAL AORTOGRAM;  Surgeon: Serene Gaile ORN, MD;  Location: [facility] LAB;  Service: Cardiovascular;  Laterality: N/A;   ABDOMINAL AORTOGRAM W/LOWER EXTREMITY N/A 05/17/2023   Procedure:  ABDOMINAL AORTOGRAM W/LOWER EXTREMITY;  Surgeon: Pearline Norman RAMAN, MD;  Location: [facility] INVASIVE CV LAB;  Service: Cardiovascular;  Laterality: N/A;   ABDOMINAL AORTOGRAM W/LOWER EXTREMITY N/A 10/27/2023   Procedure: ABDOMINAL AORTOGRAM W/LOWER EXTREMITY;  Surgeon: Lanis Fonda BRAVO, MD;  Location: [facility] INVASIVE CV LAB;  Service: Cardiovascular;  Laterality: N/A;   CARDIOVERSION N/A 10/07/2021   Procedure: CARDIOVERSION;  Surgeon: Sheena Pugh, DO;  Location: [facility]OS[facility];  Service: Cardiovascular;  Laterality: N/A;   IR ANGIO INTRA EXTRACRAN SEL COM CAROTID INNOMINATE BILAT MOD SED  09/10/2017   IR ANGIO VERTEBRAL SEL SUBCLAVIAN INNOMINATE BILAT MOD SED  09/10/2017   LOWER EXTREMITY ANGIOGRAPHY N/A 07/20/2023   Procedure: Lower Extremity Angiography;  Surgeon: Serene Gaile ORN, MD;  Location: [facility];  Service: Cardiovascular;  Laterality: N/A;   LOWER EXTREMITY INTERVENTION Left 07/20/2023   Procedure: LOWER EXTREMITY INTERVENTION;  Surgeon: Serene Gaile ORN, MD;  Location: [facility];  Service: Cardiovascular;  Laterality: Left;   PERIPHERAL INTRAVASCULAR LITHOTRIPSY Right 05/17/2023   Procedure: PERIPHERAL INTRAVASCULAR LITHOTRIPSY;  Surgeon: Pearline Norman RAMAN, MD;  Location: [facility] INVASIVE CV LAB;  Service: Cardiovascular;  Laterality: Right;  SFA-POP   PERIPHERAL INTRAVASCULAR LITHOTRIPSY Left 07/20/2023   Procedure: PERIPHERAL INTRAVASCULAR LITHOTRIPSY;  Surgeon: Serene Gaile ORN, MD;  Location: [facility];  Service: Cardiovascular;  Laterality: Left;   PERIPHERAL VASCULAR BALLOON ANGIOPLASTY Right 05/17/2023   Procedure: PERIPHERAL VASCULAR BALLOON ANGIOPLASTY;  Surgeon: Pearline Norman RAMAN, MD;  Location: [facility];  Service: Cardiovascular;  Laterality: Right;  SFA-POP   Social History   Occupational History   Not on file  Tobacco Use   Smoking status: Never   Smokeless tobacco: Never   Tobacco comments:    Never smoke 09/23/21  Vaping Use   Vaping status: Never Used   Substance and Sexual Activity   Alcohol  use: Yes    Alcohol /week: 2.0 standard drinks of alcohol     Types: 1 Cans of beer, 1 Shots of liquor per week    Comment: occasionally   Drug use: No   Sexual activity: Not on file

## 2023-11-26 ENCOUNTER — Encounter: Admitting: Vascular Surgery

## 2023-11-26 ENCOUNTER — Encounter (HOSPITAL_COMMUNITY)

## 2023-11-26 DIAGNOSIS — L03116 Cellulitis of left lower limb: Secondary | ICD-10-CM | POA: Diagnosis not present

## 2023-11-29 ENCOUNTER — Encounter: Payer: Self-pay | Admitting: Orthopedic Surgery

## 2023-11-30 ENCOUNTER — Telehealth: Payer: Self-pay | Admitting: Orthopedic Surgery

## 2023-12-01 ENCOUNTER — Telehealth: Payer: Self-pay | Admitting: Orthopedic Surgery

## 2023-12-01 DIAGNOSIS — L03116 Cellulitis of left lower limb: Secondary | ICD-10-CM | POA: Diagnosis not present

## 2023-12-01 NOTE — Telephone Encounter (Signed)
 Just FYI this may be cancelled on Friday. Will keep you posted after Dr. Harden speaks with pt.

## 2023-12-01 NOTE — Telephone Encounter (Signed)
 Pt called stating that he is unware of him having surgery this coming Friday. Pt states him and hs family have more question. Pt is asking for Dr Harden to call him. Pt phone number is [phone]

## 2023-12-02 ENCOUNTER — Encounter: Payer: Self-pay | Admitting: Orthopedic Surgery

## 2023-12-02 NOTE — Telephone Encounter (Signed)
 My chart message sent to Dr. Harden to review and respond

## 2023-12-03 DIAGNOSIS — L03116 Cellulitis of left lower limb: Secondary | ICD-10-CM | POA: Diagnosis not present

## 2023-12-07 ENCOUNTER — Ambulatory Visit (INDEPENDENT_AMBULATORY_CARE_PROVIDER_SITE_OTHER): Admitting: Orthopedic Surgery

## 2023-12-07 DIAGNOSIS — I890 Lymphedema, not elsewhere classified: Secondary | ICD-10-CM

## 2023-12-07 DIAGNOSIS — I87319 Chronic venous hypertension (idiopathic) with ulcer of unspecified lower extremity: Secondary | ICD-10-CM

## 2023-12-07 DIAGNOSIS — I739 Peripheral vascular disease, unspecified: Secondary | ICD-10-CM

## 2023-12-10 DIAGNOSIS — L03116 Cellulitis of left lower limb: Secondary | ICD-10-CM | POA: Diagnosis not present

## 2023-12-12 ENCOUNTER — Encounter: Payer: Self-pay | Admitting: Orthopedic Surgery

## 2023-12-12 NOTE — Progress Notes (Signed)
 Office Visit Note   Patient: Logan French           Date of Birth: 1934/09/13           MRN: [id number] Visit Date: 12/07/2023              Requested by: Yolande Toribio MATSU, MD [address] [place],  [place] [address] PCP: Yolande Toribio MATSU, MD  Chief Complaint  Patient presents with   Left Leg - Follow-up    Discuss surgery    Right Leg - Follow-up      HPI: Discussed the use of AI scribe software for clinical note transcription with the patient, who gave verbal consent to proceed.  History of Present Illness Logan French is an 89 year old male who presents with chronic leg wounds. He is accompanied by his son.  He has chronic leg wounds, particularly on his left leg, which is more severely affected due to significant swelling and compromised blood flow from two affected arteries.  He uses compression wraps to manage the swelling, as he finds compression socks uncomfortable. He has difficulty keeping his legs elevated, which is necessary to prevent further swelling and promote healing. He often sits with his legs down, exacerbating the swelling and pain, particularly in the mornings.  He is currently using a wound vac, which applies negative pressure to the wounds to aid in healing. The wound vac is a portable device about the size of a grapefruit, with a separate pump for each leg. The wound vac is expected to stay on for a week after he returns home from the hospital.  He experiences difficulty managing pain, particularly at night, which is somewhat alleviated by the wound nurse through bathing. He is concerned about the potential for new wounds if the swelling is not controlled.  He is currently using tissue grafts to promote healing, which involves the application of a fish-derived product that recruits fibroblasts to lay down collagen, aiding in skin regeneration. The left leg is particularly affected due to the compromised blood flow and previous swelling  issues.  His son is concerned about his ability to manage post-surgery care at home, given the need for constant leg elevation and behavior modification. He is considering options to facilitate this, such as modifying his workspace to allow for leg elevation.     Assessment & Plan: Visit Diagnoses:  1. PAD (peripheral artery disease)   2. 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 Lymphedema     Plan: Assessment and Plan Assessment & Plan Chronic bilateral lower extremity venous stasis ulcers with infection Chronic venous stasis ulcers on both lower extremities, currently infected. The left leg is more severely affected due to compromised blood flow and significant swelling. - Perform tissue grafting on all open wounds. - Apply wound vac therapy for one week post-surgery. - Ensure regular dressing changes post-wound vac removal. - Educate on the importance of leg elevation to prevent new ulcers. - Discuss the need for behavior modification to ensure treatment success.  Chronic bilateral lower extremity edema Chronic edema in both lower extremities, with the left leg more severely affected. The edema contributes to ulcer formation and impedes healing. - Use compression wraps indefinitely. - Advise on consistent leg elevation above heart level. - Encourage use of a stationary bike for exercise to promote circulation.  Chronic pain of lower extremities due to ulcers Chronic pain in the lower extremities associated  with venous stasis ulcers. Pain management is crucial for improving quality of life and adherence to treatment protocols. - Advise against sleeping in a chair to prevent worsening of pain.      Follow-Up Instructions: Return if symptoms worsen or fail to improve.   Ortho Exam  Patient is alert, oriented, no adenopathy, well-dressed, normal affect, normal respiratory effort. Physical Exam   On examination patient has  multiple ulcers in both lower extremities.  There is no ascending cellulitis.  He does have venous and lymphatic insufficiency.  Patient maintains dependency of his legs almost all the time.    Imaging: No results found. No images are attached to the encounter.  Labs: Lab Results  Component Value Date   HGBA1C 6.2 (H) 10/06/2023   HGBA1C 6.4 (H) 06/17/2023   CRP 26.9 (H) 06/23/2023   CRP 27.5 (H) 06/22/2023   CRP 27.6 (H) 06/21/2023   LABURIC 5.0 06/22/2023   LABURIC 5.3 06/21/2023   REPTSTATUS 07/20/2023 FINAL 07/15/2023   GRAMSTAIN Rare 10/29/2015   GRAMSTAIN WBC present-predominately Mononuclear 10/29/2015   GRAMSTAIN No Squamous Epithelial Cells Seen 10/29/2015   GRAMSTAIN Moderate Gram Positive Cocci In Pairs In Clusters 10/29/2015   CULT  07/15/2023    NO GROWTH 5 DAYS Performed at [facility], [address] [place], [place] [address]    LABORGA GROUP A STREP (S.PYOGENES) ISOLATED 06/17/2023     Lab Results  Component Value Date   ALBUMIN 2.0 (L) 08/04/2023   ALBUMIN 2.2 (L) 08/02/2023   ALBUMIN 2.1 (L) 07/28/2023    Lab Results  Component Value Date   MG 2.0 06/23/2023   MG 2.0 06/22/2023   MG 2.0 06/21/2023   Lab Results  Component Value Date   VD25OH 27.89 (L) 06/26/2023    No results found for: PREALBUMIN    Latest Ref Rng & Units 10/27/2023    7:08 AM 08/07/2023    7:25 AM 08/06/2023    6:43 AM  CBC EXTENDED  WBC 4.0 - 10.5 K/uL  6.9  7.7   RBC 4.22 - 5.81 MIL/uL  4.16  3.92   Hemoglobin 13.0 - 17.0 g/dL 88.3  88.2  89.0   HCT 39.0 - 52.0 % 34.0  35.9  33.7   Platelets 150 - 400 K/uL  385  368   NEUT# 1.7 - 7.7 K/uL  4.5  5.1   Lymph# 0.7 - 4.0 K/uL  1.5  1.6      There is no height or weight on file to calculate BMI.  Orders:  No orders of the defined types were placed in this encounter.  No orders of the defined types were placed in this encounter.    Procedures: No procedures performed  Clinical Data: No additional  findings.  ROS:  All other systems negative, except as noted in the HPI. Review of Systems  Objective: Vital Signs: There were no vitals taken for this visit.  Specialty Comments:  No specialty comments available.  PMFS History: Patient Active Problem List   Diagnosis Date Noted   Medication management 08/28/2023   Cellulitis 07/15/2023   Peripheral arterial disease 07/15/2023   Atrial fibrillation, chronic (HCC) 07/15/2023   Dyslipidemia 07/15/2023   BPH (benign prostatic hyperplasia) 07/15/2023   HTN (hypertension) 07/15/2023   DM (diabetes mellitus) (HCC) 07/15/2023   Bacteremia 06/25/2023   Sepsis due to undetermined organism (HCC) 06/21/2023   Cellulitis of right lower leg 06/21/2023   Non-pressure chronic ulcer of right ankle limited to breakdown  of skin (HCC) 06/20/2023   Streptococcal bacteremia 06/18/2023   Cellulitis of right lower extremity 06/17/2023   Persistent atrial fibrillation (HCC) 09/23/2021   Hypercoagulable state due to persistent atrial fibrillation (HCC) 09/23/2021   Callus 10/25/2019   Corns and callosities 10/25/2019   Hav (hallux abducto valgus), unspecified laterality 10/25/2019   Altered awareness, transient 06/23/2017   MALLET FINGER, RIGHT RING FINGER 10/19/2008   Past Medical History:  Diagnosis Date   A-fib (HCC)    B12 deficiency    Diverticulosis    Fatigue    History of colonic polyps    Hyperlipemia    Hypertension    Peripheral vascular disease    Pernicious anemia    Prostate cancer (HCC)    Syncope    Syncope     Family History  Problem Relation Age of Onset   Stroke Neg Hx    Transient ischemic attack Neg Hx     Past Surgical History:  Procedure Laterality Date   ABDOMINAL AORTOGRAM N/A 07/20/2023   Procedure: ABDOMINAL AORTOGRAM;  Surgeon: Serene Gaile ORN, MD;  Location: [facility];  Service: Cardiovascular;  Laterality: N/A;   ABDOMINAL AORTOGRAM W/LOWER EXTREMITY N/A 05/17/2023   Procedure: ABDOMINAL  AORTOGRAM W/LOWER EXTREMITY;  Surgeon: Pearline Norman RAMAN, MD;  Location: [facility] INVASIVE CV LAB;  Service: Cardiovascular;  Laterality: N/A;   ABDOMINAL AORTOGRAM W/LOWER EXTREMITY N/A 10/27/2023   Procedure: ABDOMINAL AORTOGRAM W/LOWER EXTREMITY;  Surgeon: Lanis Fonda BRAVO, MD;  Location: [facility] INVASIVE CV LAB;  Service: Cardiovascular;  Laterality: N/A;   CARDIOVERSION N/A 10/07/2021   Procedure: CARDIOVERSION;  Surgeon: Sheena Pugh, DO;  Location: [facility];  Service: Cardiovascular;  Laterality: N/A;   IR ANGIO INTRA EXTRACRAN SEL COM CAROTID INNOMINATE BILAT MOD SED  09/10/2017   IR ANGIO VERTEBRAL SEL SUBCLAVIAN INNOMINATE BILAT MOD SED  09/10/2017   LOWER EXTREMITY ANGIOGRAPHY N/A 07/20/2023   Procedure: Lower Extremity Angiography;  Surgeon: Serene Gaile ORN, MD;  Location: [facility];  Service: Cardiovascular;  Laterality: N/A;   LOWER EXTREMITY INTERVENTION Left 07/20/2023   Procedure: LOWER EXTREMITY INTERVENTION;  Surgeon: Serene Gaile ORN, MD;  Location: [facility];  Service: Cardiovascular;  Laterality: Left;   PERIPHERAL INTRAVASCULAR LITHOTRIPSY Right 05/17/2023   Procedure: PERIPHERAL INTRAVASCULAR LITHOTRIPSY;  Surgeon: Pearline Norman RAMAN, MD;  Location: [facility] INVASIVE CV LAB;  Service: Cardiovascular;  Laterality: Right;  SFA-POP   PERIPHERAL INTRAVASCULAR LITHOTRIPSY Left 07/20/2023   Procedure: PERIPHERAL INTRAVASCULAR LITHOTRIPSY;  Surgeon: Serene Gaile ORN, MD;  Location: [facility];  Service: Cardiovascular;  Laterality: Left;   PERIPHERAL VASCULAR BALLOON ANGIOPLASTY Right 05/17/2023   Procedure: PERIPHERAL VASCULAR BALLOON ANGIOPLASTY;  Surgeon: Pearline Norman RAMAN, MD;  Location: [facility];  Service: Cardiovascular;  Laterality: Right;  SFA-POP   Social History   Occupational History   Not on file  Tobacco Use   Smoking status: Never   Smokeless tobacco: Never   Tobacco comments:    Never smoke 09/23/21  Vaping Use   Vaping status: Never Used  Substance  and Sexual Activity   Alcohol  use: Yes    Alcohol /week: 2.0 standard drinks of alcohol     Types: 1 Cans of beer, 1 Shots of liquor per week    Comment: occasionally   Drug use: No   Sexual activity: Not on file

## 2023-12-14 ENCOUNTER — Other Ambulatory Visit: Payer: Self-pay

## 2023-12-14 ENCOUNTER — Encounter (HOSPITAL_COMMUNITY): Payer: Self-pay | Admitting: Orthopedic Surgery

## 2023-12-14 NOTE — Progress Notes (Signed)
 SDW CALL  Patient was given pre-op instructions over the phone. The opportunity was given for the patient to ask questions. No further questions asked. Patient verbalized understanding of instructions given.   PCP - Yolande Toribio MATSU, MD Cardiologist - Shlomo Wilbert SAUNDERS, MD  PPM/ICD - denies Device Orders -  Rep Notified -   Chest x-ray - 08/03/23 EKG - 07/15/23 Stress Test - denies ECHO - 06/18/23 Cardiac Cath - denies  Sleep Study - denies CPAP -   Fasting Blood Sugar - na Checks Blood Sugar _____ times a day  Blood Thinner Instructions: Follow Dr. Crist instructions regarding clopidogrel  and [place]. Aspirin  Instructions:na  ERAS Protcol -clear liquids until 0530 PRE-SURGERY Ensure or G2- no  COVID TEST- na   Anesthesia review: yes - hx afib,HTN,PAD  Patient denies shortness of breath, fever, cough and chest pain over the phone call  Remember:  Special instructions:    Oral Hygiene is also important to reduce your risk of infection.  Remember - BRUSH YOUR TEETH THE MORNING OF SURGERY WITH YOUR REGULAR TOOTHPASTE

## 2023-12-14 NOTE — H&P (Signed)
 Logan French is an 89 y.o. male.   Chief Complaint: Chronic leg wounds HPI: Logan French is an 89 year old male who presents with chronic leg wounds.  He has chronic leg wounds, particularly on his left leg, which is more severely affected due to significant swelling and compromised blood flow from two affected arteries.    He uses compression wraps to manage the swelling, as he finds compression socks uncomfortable. He has difficulty keeping his legs elevated, which is necessary to prevent further swelling and promote healing. He often sits with his legs down, exacerbating the swelling and pain, particularly in the mornings.   Patient is status post angioplasty of both lower extremities. Balloon angioplasty on the right on March 2025 and balloon angioplasty of the left May 2025. Ankle-brachial indices showed excellent improvement in flow after the use endovascular interventions.   Most recent hemoglobin A1c is 6.2. Albumin 2.0.   Past Medical History:  Diagnosis Date   A-fib (HCC)    B12 deficiency    Diverticulosis    Fatigue    History of colonic polyps    Hyperlipemia    Hypertension    Peripheral vascular disease    Pernicious anemia    Prostate cancer (HCC)    Syncope    Syncope     Past Surgical History:  Procedure Laterality Date   ABDOMINAL AORTOGRAM N/A 07/20/2023   Procedure: ABDOMINAL AORTOGRAM;  Surgeon: Serene Gaile ORN, MD;  Location: [facility] INVASIVE CV [facility];  Service: Cardiovascular;  Laterality: N/A;   ABDOMINAL AORTOGRAM W/LOWER EXTREMITY N/A 05/17/2023   Procedure: ABDOMINAL AORTOGRAM W/LOWER EXTREMITY;  Surgeon: Pearline Norman RAMAN, MD;  Location: [facility] INVASIVE CV LAB;  Service: Cardiovascular;  Laterality: N/A;   ABDOMINAL AORTOGRAM W/LOWER EXTREMITY N/A 10/27/2023   Procedure: ABDOMINAL AORTOGRAM W/LOWER EXTREMITY;  Surgeon: Lanis Fonda FORBES, MD;  Location: [facility] INVASIVE CV LAB;  Service: Cardiovascular;  Laterality: N/A;   CARDIOVERSION N/A 10/07/2021   Procedure:  CARDIOVERSION;  Surgeon: Sheena Pugh, DO;  Location: [facility] ENDOSCOPY;  Service: Cardiovascular;  Laterality: N/A;   IR ANGIO INTRA EXTRACRAN SEL COM CAROTID INNOMINATE BILAT MOD SED  09/10/2017   IR ANGIO VERTEBRAL SEL SUBCLAVIAN INNOMINATE BILAT MOD SED  09/10/2017   LOWER EXTREMITY ANGIOGRAPHY N/A 07/20/2023   Procedure: Lower Extremity Angiography;  Surgeon: Serene Gaile ORN, MD;  Location: [facility] INVAS[facility] CV [facility];  Service: Cardiovascular;  Laterality: N/A;   LOWER EXTREMITY INTERVENTION Left 07/20/2023   Procedure: LOWER EXTREMITY INTERVENTION;  Surgeon: Serene Gaile ORN, MD;  Location: [facility];  Service: Cardiovascular;  Laterality: Left;   PERIPHERAL INTRAVASCULAR LITHOTRIPSY Right 05/17/2023   Procedure: PERIPHERAL INTRAVASCULAR LITHOTRIPSY;  Surgeon: Pearline Norman RAMAN, MD;  Location: [facility] INVASIVE CV LAB;  Service: Cardiovascular;  Laterality: Right;  SFA-POP   PERIPHERAL INTRAVASCULAR LITHOTRIPSY Left 07/20/2023   Procedure: PERIPHERAL INTRAVASCULAR LITHOTRIPSY;  Surgeon: Serene Gaile ORN, MD;  Location: [facility]VAS[facility];  Service: Cardiovascular;  Laterality: Left;   PERIPHERAL VASCULAR BALLOON ANGIOPLASTY Right 05/17/2023   Procedure: PERIPHERAL VASCULAR BALLOON ANGIOPLASTY;  Surgeon: Pearline Norman RAMAN, MD;  Location: [facility] INVASIVE CV [facility];  Service: Cardiovascular;  Laterality: Right;  SFA-POP    Family History  Problem Relation Age of Onset   Stroke Neg Hx    Transient ischemic attack Neg Hx    Social History:  reports that he has never smoked. He has never used smokeless tobacco. He reports current alcohol  use of about 2.0 standard drinks of alcohol  per  week. He reports that he does not use drugs.  Allergies: No Known Allergies  No medications prior to admission.    No results found for this or any previous visit (from the past 48 hours). No results found.  Review of Systems  All other systems reviewed and are negative.   There were no vitals taken for this visit. Physical Exam   On examination patient has multiple ulcers in both lower extremities. There is no ascending cellulitis. He does have venous and lymphatic insufficiency. Patient maintains dependency of his legs almost all the time.  Lungs non labored breathing Heart Afib  General no acute distress  Assessment/Plan Visit Diagnoses:  1. PAD (peripheral artery disease)   2. 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 Lymphedema        Assessment & Plan Chronic bilateral lower extremity venous stasis ulcers with infection Chronic venous stasis ulcers on both lower extremities, currently infected. The left leg is more severely affected due to compromised blood flow and significant swelling. - Perform tissue grafting on all open wounds. - Apply wound vac therapy for one week post-surgery. - Ensure regular dressing changes post-wound vac removal. - Educate on the importance of leg elevation to prevent new ulcers. - Discuss the need for behavior modification to ensure treatment success.   Chronic bilateral lower extremity edema Chronic edema in both lower extremities, with the left leg more severely affected. The edema contributes to ulcer formation and impedes healing. - Use compression wraps indefinitely. - Advise on consistent leg elevation above heart level. - Encourage use of a stationary bike for exercise to promote circulation.   Chronic pain of lower extremities due to ulcers Chronic pain in the lower extremities associated with venous stasis ulcers. Pain management is crucial for improving quality of life and adherence to treatment protocols. - Advise against sleeping in a chair to prevent worsening of pain.      Maurilio Deland Collet, PA-C 12/14/2023, 12:44 PM

## 2023-12-15 ENCOUNTER — Inpatient Hospital Stay (HOSPITAL_COMMUNITY)
Admission: RE | Admit: 2023-12-15 | Discharge: 2023-12-20 | DRG: 264 | Disposition: A | Discharge status: Home / Self Care | Attending: Orthopedic Surgery | Admitting: Orthopedic Surgery

## 2023-12-15 ENCOUNTER — Inpatient Hospital Stay (HOSPITAL_COMMUNITY): Payer: Self-pay

## 2023-12-15 ENCOUNTER — Other Ambulatory Visit: Payer: Self-pay

## 2023-12-15 ENCOUNTER — Encounter (HOSPITAL_COMMUNITY)
Admission: RE | Disposition: A | Discharge status: Home / Self Care | Payer: Self-pay | Source: Home / Self Care | Attending: Orthopedic Surgery

## 2023-12-15 DIAGNOSIS — G8929 Other chronic pain: Secondary | ICD-10-CM | POA: Diagnosis present

## 2023-12-15 DIAGNOSIS — I87333 Chronic venous hypertension (idiopathic) with ulcer and inflammation of bilateral lower extremity: Secondary | ICD-10-CM | POA: Diagnosis not present

## 2023-12-15 DIAGNOSIS — L97919 Non-pressure chronic ulcer of unspecified part of right lower leg with unspecified severity: Secondary | ICD-10-CM | POA: Diagnosis present

## 2023-12-15 DIAGNOSIS — I878 Other specified disorders of veins: Secondary | ICD-10-CM | POA: Diagnosis present

## 2023-12-15 DIAGNOSIS — I87313 Chronic venous hypertension (idiopathic) with ulcer of bilateral lower extremity: Secondary | ICD-10-CM

## 2023-12-15 DIAGNOSIS — Z860100 Personal history of colon polyps, unspecified: Secondary | ICD-10-CM | POA: Diagnosis not present

## 2023-12-15 DIAGNOSIS — L03115 Cellulitis of right lower limb: Secondary | ICD-10-CM

## 2023-12-15 DIAGNOSIS — Z23 Encounter for immunization: Secondary | ICD-10-CM | POA: Diagnosis not present

## 2023-12-15 DIAGNOSIS — Z8546 Personal history of malignant neoplasm of prostate: Secondary | ICD-10-CM | POA: Diagnosis not present

## 2023-12-15 DIAGNOSIS — I83019 Varicose veins of right lower extremity with ulcer of unspecified site: Secondary | ICD-10-CM | POA: Diagnosis present

## 2023-12-15 DIAGNOSIS — I482 Chronic atrial fibrillation, unspecified: Secondary | ICD-10-CM

## 2023-12-15 DIAGNOSIS — E119 Type 2 diabetes mellitus without complications: Secondary | ICD-10-CM | POA: Diagnosis present

## 2023-12-15 DIAGNOSIS — I739 Peripheral vascular disease, unspecified: Secondary | ICD-10-CM | POA: Diagnosis present

## 2023-12-15 DIAGNOSIS — I10 Essential (primary) hypertension: Secondary | ICD-10-CM

## 2023-12-15 DIAGNOSIS — I83029 Varicose veins of left lower extremity with ulcer of unspecified site: Secondary | ICD-10-CM | POA: Diagnosis present

## 2023-12-15 DIAGNOSIS — L97311 Non-pressure chronic ulcer of right ankle limited to breakdown of skin: Principal | ICD-10-CM

## 2023-12-15 DIAGNOSIS — I890 Lymphedema, not elsewhere classified: Secondary | ICD-10-CM | POA: Diagnosis present

## 2023-12-15 DIAGNOSIS — L97929 Non-pressure chronic ulcer of unspecified part of left lower leg with unspecified severity: Secondary | ICD-10-CM | POA: Diagnosis not present

## 2023-12-15 DIAGNOSIS — I4891 Unspecified atrial fibrillation: Secondary | ICD-10-CM | POA: Diagnosis present

## 2023-12-15 DIAGNOSIS — R0989 Other specified symptoms and signs involving the circulatory and respiratory systems: Secondary | ICD-10-CM | POA: Diagnosis present

## 2023-12-15 DIAGNOSIS — M7989 Other specified soft tissue disorders: Secondary | ICD-10-CM | POA: Diagnosis present

## 2023-12-15 DIAGNOSIS — E785 Hyperlipidemia, unspecified: Secondary | ICD-10-CM | POA: Diagnosis present

## 2023-12-15 HISTORY — PX: INCISION AND DRAINAGE OF DEEP ABSCESS, CALF: SHX7361

## 2023-12-15 HISTORY — PX: APPLICATION OF WOUND VAC: SHX5189

## 2023-12-15 LAB — CBC WITH DIFFERENTIAL/PLATELET
Abs Immature Granulocytes: 0.03 K/uL (ref 0.00–0.07)
Basophils Absolute: 0.1 K/uL (ref 0.0–0.1)
Basophils Relative: 1 %
Eosinophils Absolute: 0.3 K/uL (ref 0.0–0.5)
Eosinophils Relative: 4 %
HCT: 39.9 % (ref 39.0–52.0)
Hemoglobin: 11.8 g/dL — ABNORMAL LOW (ref 13.0–17.0)
Immature Granulocytes: 0 %
Lymphocytes Relative: 24 %
Lymphs Abs: 1.6 K/uL (ref 0.7–4.0)
MCH: 23 pg — ABNORMAL LOW (ref 26.0–34.0)
MCHC: 29.6 g/dL — ABNORMAL LOW (ref 30.0–36.0)
MCV: 77.6 fL — ABNORMAL LOW (ref 80.0–100.0)
Monocytes Absolute: 0.8 K/uL (ref 0.1–1.0)
Monocytes Relative: 12 %
Neutro Abs: 3.9 K/uL (ref 1.7–7.7)
Neutrophils Relative %: 59 %
Platelets: 267 K/uL (ref 150–400)
RBC: 5.14 MIL/uL (ref 4.22–5.81)
RDW: 21 % — ABNORMAL HIGH (ref 11.5–15.5)
WBC: 6.7 K/uL (ref 4.0–10.5)
nRBC: 0 % (ref 0.0–0.2)

## 2023-12-15 LAB — COMPREHENSIVE METABOLIC PANEL WITH GFR
ALT: 32 U/L (ref 0–44)
AST: 40 U/L (ref 15–41)
Albumin: 2.7 g/dL — ABNORMAL LOW (ref 3.5–5.0)
Alkaline Phosphatase: 90 U/L (ref 38–126)
Anion gap: 11 (ref 5–15)
BUN: 17 mg/dL (ref 8–23)
CO2: 24 mmol/L (ref 22–32)
Calcium: 8.7 mg/dL — ABNORMAL LOW (ref 8.9–10.3)
Chloride: 102 mmol/L (ref 98–111)
Creatinine, Ser: 1.08 mg/dL (ref 0.61–1.24)
GFR, Estimated: >60 mL/min (ref >60)
Glucose, Bld: 101 mg/dL — ABNORMAL HIGH (ref 70–99)
Potassium: 4.3 mmol/L (ref 3.5–5.1)
Sodium: 137 mmol/L (ref 135–145)
Total Bilirubin: 1 mg/dL (ref 0.0–1.2)
Total Protein: 5.7 g/dL — ABNORMAL LOW (ref 6.5–8.1)

## 2023-12-15 LAB — GLUCOSE, CAPILLARY: Glucose-Capillary: 97 mg/dL (ref 70–99)

## 2023-12-15 SURGERY — INCISION AND DRAINAGE OF DEEP ABSCESS, CALF
Anesthesia: General | Site: Leg Lower | Laterality: Bilateral

## 2023-12-15 MED ORDER — ACETAMINOPHEN 500 MG PO TABS
1000.0000 mg | ORAL_TABLET | Freq: Four times a day (QID) | ORAL | Status: AC
  Administered 2023-12-15 – 2023-12-16 (×3): 1000 mg via ORAL
  Filled 2023-12-15 (×4): qty 2

## 2023-12-15 MED ORDER — LIDOCAINE 2% (20 MG/ML) 5 ML SYRINGE
INTRAMUSCULAR | Status: DC | PRN
  Administered 2023-12-15: 100 mg via INTRAVENOUS

## 2023-12-15 MED ORDER — OXYCODONE HCL 5 MG PO TABS: 5.0000 mg | ORAL_TABLET | Freq: Once | ORAL | Status: AC | PRN

## 2023-12-15 MED ORDER — EZETIMIBE 10 MG PO TABS
10.0000 mg | ORAL_TABLET | Freq: Every day | ORAL | Status: DC
  Administered 2023-12-16 – 2023-12-20 (×5): 10 mg via ORAL
  Filled 2023-12-15 (×5): qty 1

## 2023-12-15 MED ORDER — POTASSIUM CHLORIDE CRYS ER 20 MEQ PO TBCR: 20.0000 meq | EXTENDED_RELEASE_TABLET | Freq: Every day | ORAL | Status: DC | PRN

## 2023-12-15 MED ORDER — FENTANYL CITRATE (PF) 250 MCG/5ML IJ SOLN
INTRAMUSCULAR | Status: DC | PRN
  Administered 2023-12-15 (×4): 50 ug via INTRAVENOUS

## 2023-12-15 MED ORDER — FERROUS SULFATE 325 (65 FE) MG PO TABS
325.0000 mg | ORAL_TABLET | ORAL | Status: DC
  Administered 2023-12-15 – 2023-12-20 (×3): 325 mg via ORAL
  Filled 2023-12-15 (×3): qty 1

## 2023-12-15 MED ORDER — VITAMIN B-12 1000 MCG PO TABS
500.0000 ug | ORAL_TABLET | Freq: Every day | ORAL | Status: DC
  Administered 2023-12-15 – 2023-12-20 (×6): 500 ug via ORAL
  Filled 2023-12-15 (×6): qty 1

## 2023-12-15 MED ORDER — HYDROCHLOROTHIAZIDE 12.5 MG PO TABS
12.5000 mg | ORAL_TABLET | Freq: Every day | ORAL | Status: DC
  Administered 2023-12-15 – 2023-12-20 (×6): 12.5 mg via ORAL
  Filled 2023-12-15 (×6): qty 1

## 2023-12-15 MED ORDER — CEFAZOLIN SODIUM-DEXTROSE 2-4 GM/100ML-% IV SOLN
2.0000 g | INTRAVENOUS | Status: AC
  Administered 2023-12-15: 2 g via INTRAVENOUS
  Filled 2023-12-15: qty 100

## 2023-12-15 MED ORDER — LACTATED RINGERS IV SOLN: INTRAVENOUS | Status: DC | PRN

## 2023-12-15 MED ORDER — ACETAMINOPHEN 10 MG/ML IV SOLN
INTRAVENOUS | Status: AC
Start: 2023-12-15 — End: 2023-12-15
  Filled 2023-12-15: qty 100

## 2023-12-15 MED ORDER — CEFAZOLIN SODIUM-DEXTROSE 2-4 GM/100ML-% IV SOLN
2.0000 g | Freq: Four times a day (QID) | INTRAVENOUS | Status: AC
  Administered 2023-12-15 (×2): 2 g via INTRAVENOUS
  Filled 2023-12-15 (×2): qty 100

## 2023-12-15 MED ORDER — FENTANYL CITRATE (PF) 100 MCG/2ML IJ SOLN
INTRAMUSCULAR | Status: AC
  Filled 2023-12-15: qty 2

## 2023-12-15 MED ORDER — OXYCODONE HCL 5 MG/5ML PO SOLN
5.0000 mg | Freq: Once | ORAL | Status: AC | PRN
  Administered 2023-12-15: 5 mg via ORAL

## 2023-12-15 MED ORDER — ONDANSETRON HCL 4 MG/2ML IJ SOLN
INTRAMUSCULAR | Status: DC | PRN
  Administered 2023-12-15: 4 mg via INTRAVENOUS

## 2023-12-15 MED ORDER — COLCHICINE 0.6 MG PO TABS
0.6000 mg | ORAL_TABLET | Freq: Every day | ORAL | Status: DC
  Administered 2023-12-16 – 2023-12-20 (×5): 0.6 mg via ORAL
  Filled 2023-12-15 (×6): qty 1

## 2023-12-15 MED ORDER — CHLORHEXIDINE GLUCONATE 0.12 % MT SOLN
15.0000 mL | Freq: Once | OROMUCOSAL | Status: AC
  Administered 2023-12-15: 15 mL via OROMUCOSAL
  Filled 2023-12-15: qty 15

## 2023-12-15 MED ORDER — OXYCODONE HCL 5 MG PO TABS
5.0000 mg | ORAL_TABLET | ORAL | Status: DC | PRN
  Administered 2023-12-15 – 2023-12-16 (×2): 5 mg via ORAL
  Administered 2023-12-17: 10 mg via ORAL
  Administered 2023-12-18: 5 mg via ORAL
  Administered 2023-12-19 – 2023-12-20 (×3): 10 mg via ORAL
  Filled 2023-12-15: qty 2
  Filled 2023-12-15 (×2): qty 1
  Filled 2023-12-15: qty 2
  Filled 2023-12-15: qty 1
  Filled 2023-12-15 (×3): qty 2

## 2023-12-15 MED ORDER — CLOPIDOGREL BISULFATE 75 MG PO TABS
75.0000 mg | ORAL_TABLET | Freq: Every day | ORAL | Status: DC
  Administered 2023-12-15 – 2023-12-20 (×6): 75 mg via ORAL
  Filled 2023-12-15 (×6): qty 1

## 2023-12-15 MED ORDER — DEXAMETHASONE SODIUM PHOSPHATE 10 MG/ML IJ SOLN
INTRAMUSCULAR | Status: AC
Start: 2023-12-15 — End: 2023-12-15
  Filled 2023-12-15: qty 2

## 2023-12-15 MED ORDER — ORAL CARE MOUTH RINSE: 15.0000 mL | Freq: Once | OROMUCOSAL | Status: AC

## 2023-12-15 MED ORDER — PANTOPRAZOLE SODIUM 40 MG PO TBEC
40.0000 mg | DELAYED_RELEASE_TABLET | Freq: Every day | ORAL | Status: DC
Start: 2023-12-16 — End: 2023-12-20
  Administered 2023-12-16 – 2023-12-20 (×5): 40 mg via ORAL
  Filled 2023-12-15 (×5): qty 1

## 2023-12-15 MED ORDER — OXYCODONE HCL 5 MG/5ML PO SOLN
ORAL | Status: AC
  Filled 2023-12-15: qty 5

## 2023-12-15 MED ORDER — LACTATED RINGERS IV SOLN: INTRAVENOUS | Status: DC

## 2023-12-15 MED ORDER — ACETAMINOPHEN 325 MG PO TABS
325.0000 mg | ORAL_TABLET | Freq: Four times a day (QID) | ORAL | Status: DC | PRN
  Administered 2023-12-18: 600 mg via ORAL
  Filled 2023-12-15 (×2): qty 2

## 2023-12-15 MED ORDER — ROSUVASTATIN CALCIUM 20 MG PO TABS
20.0000 mg | ORAL_TABLET | Freq: Every day | ORAL | Status: DC
  Administered 2023-12-16 – 2023-12-20 (×5): 20 mg via ORAL
  Filled 2023-12-15 (×5): qty 1

## 2023-12-15 MED ORDER — METOCLOPRAMIDE HCL 5 MG PO TABS: 5.0000 mg | ORAL_TABLET | Freq: Three times a day (TID) | ORAL | Status: DC | PRN

## 2023-12-15 MED ORDER — METFORMIN HCL 500 MG PO TABS
500.0000 mg | ORAL_TABLET | Freq: Every day | ORAL | Status: DC
  Administered 2023-12-16 – 2023-12-20 (×5): 500 mg via ORAL
  Filled 2023-12-15 (×5): qty 1

## 2023-12-15 MED ORDER — PROPOFOL 10 MG/ML IV BOLUS
INTRAVENOUS | Status: DC | PRN
  Administered 2023-12-15: 150 mg via INTRAVENOUS

## 2023-12-15 MED ORDER — ONDANSETRON HCL 4 MG PO TABS: 4.0000 mg | ORAL_TABLET | Freq: Four times a day (QID) | ORAL | Status: DC | PRN

## 2023-12-15 MED ORDER — FENTANYL CITRATE (PF) 250 MCG/5ML IJ SOLN
INTRAMUSCULAR | Status: AC
  Filled 2023-12-15: qty 5

## 2023-12-15 MED ORDER — FUROSEMIDE 20 MG PO TABS: 20.0000 mg | ORAL_TABLET | Freq: Every day | ORAL | Status: DC | PRN

## 2023-12-15 MED ORDER — STUDY - LIBREXIA-AF - APIXABAN 5 MG OR PLACEBO CAPSULE (PI-SETHI)
5.0000 mg | ORAL_CAPSULE | Freq: Two times a day (BID) | ORAL | Status: DC
Start: 2023-12-17 — End: 2023-12-20
  Administered 2023-12-17 – 2023-12-20 (×6): 5 mg via ORAL
  Filled 2023-12-15 (×7): qty 1

## 2023-12-15 MED ORDER — HYDROMORPHONE HCL 1 MG/ML IJ SOLN: 0.5000 mg | INTRAMUSCULAR | Status: DC | PRN

## 2023-12-15 MED ORDER — ACETAMINOPHEN 10 MG/ML IV SOLN
1000.0000 mg | Freq: Once | INTRAVENOUS | Status: DC | PRN
  Administered 2023-12-15: 1000 mg via INTRAVENOUS

## 2023-12-15 MED ORDER — SODIUM CHLORIDE 0.9 % IV SOLN: INTRAVENOUS | Status: AC

## 2023-12-15 MED ORDER — STUDY - LIBREXIA-AF - JNJ-70033093 (MILVEXIAN) 100 MG OR PLACEBO (PI-SETHI)
1.0000 | ORAL_TABLET | Freq: Two times a day (BID) | ORAL | Status: DC
  Administered 2023-12-17 – 2023-12-20 (×6): 1 via ORAL
  Filled 2023-12-15 (×7): qty 1

## 2023-12-15 MED ORDER — DOCUSATE SODIUM 100 MG PO CAPS
100.0000 mg | ORAL_CAPSULE | Freq: Two times a day (BID) | ORAL | Status: DC
Start: 2023-12-15 — End: 2023-12-20
  Administered 2023-12-15 – 2023-12-20 (×11): 100 mg via ORAL
  Filled 2023-12-15 (×11): qty 1

## 2023-12-15 MED ORDER — PHENYLEPHRINE HCL-NACL 20-0.9 MG/250ML-% IV SOLN
INTRAVENOUS | Status: DC | PRN
  Administered 2023-12-15: 25 ug/min via INTRAVENOUS

## 2023-12-15 MED ORDER — VASHE WOUND IRRIGATION OPTIME
TOPICAL | Status: DC | PRN
  Administered 2023-12-15: 34 [oz_av]

## 2023-12-15 MED ORDER — DEXAMETHASONE SODIUM PHOSPHATE 10 MG/ML IJ SOLN
INTRAMUSCULAR | Status: DC | PRN
  Administered 2023-12-15: 5 mg via INTRAVENOUS

## 2023-12-15 MED ORDER — FENTANYL CITRATE (PF) 100 MCG/2ML IJ SOLN
25.0000 ug | INTRAMUSCULAR | Status: DC | PRN
  Administered 2023-12-15: 50 ug via INTRAVENOUS

## 2023-12-15 MED ORDER — SODIUM CHLORIDE 0.9 % IV SOLN
3.0000 g | Freq: Four times a day (QID) | INTRAVENOUS | Status: DC
  Administered 2023-12-15 – 2023-12-18 (×13): 3 g via INTRAVENOUS
  Filled 2023-12-15 (×13): qty 8

## 2023-12-15 MED ORDER — LIDOCAINE 2% (20 MG/ML) 5 ML SYRINGE
INTRAMUSCULAR | Status: AC
  Filled 2023-12-15: qty 5

## 2023-12-15 MED ORDER — ONDANSETRON HCL 4 MG/2ML IJ SOLN: 4.0000 mg | Freq: Once | INTRAMUSCULAR | Status: DC | PRN

## 2023-12-15 MED ORDER — ONDANSETRON HCL 4 MG/2ML IJ SOLN: 4.0000 mg | Freq: Four times a day (QID) | INTRAMUSCULAR | Status: DC | PRN

## 2023-12-15 MED ORDER — METOCLOPRAMIDE HCL 5 MG/ML IJ SOLN: 5.0000 mg | Freq: Three times a day (TID) | INTRAMUSCULAR | Status: DC | PRN

## 2023-12-15 MED ORDER — ONDANSETRON HCL 4 MG/2ML IJ SOLN
INTRAMUSCULAR | Status: AC
  Filled 2023-12-15: qty 4

## 2023-12-15 MED ORDER — FINASTERIDE 5 MG PO TABS
5.0000 mg | ORAL_TABLET | Freq: Every day | ORAL | Status: DC
  Administered 2023-12-16 – 2023-12-20 (×5): 5 mg via ORAL
  Filled 2023-12-15 (×5): qty 1

## 2023-12-15 MED ORDER — OXYCODONE HCL 5 MG PO TABS
10.0000 mg | ORAL_TABLET | ORAL | Status: DC | PRN
  Administered 2023-12-17 – 2023-12-19 (×2): 10 mg via ORAL
  Filled 2023-12-15: qty 3

## 2023-12-15 MED ORDER — LINEZOLID 600 MG/300ML IV SOLN
600.0000 mg | Freq: Two times a day (BID) | INTRAVENOUS | Status: DC
  Administered 2023-12-15 – 2023-12-20 (×11): 600 mg via INTRAVENOUS
  Filled 2023-12-15 (×12): qty 300

## 2023-12-15 MED ORDER — METOPROLOL SUCCINATE ER 25 MG PO TB24
12.5000 mg | ORAL_TABLET | Freq: Two times a day (BID) | ORAL | Status: DC
  Administered 2023-12-16 – 2023-12-20 (×7): 12.5 mg via ORAL
  Filled 2023-12-15 (×10): qty 1

## 2023-12-15 MED ORDER — VITAMIN D 25 MCG (1000 UNIT) PO TABS
1000.0000 [IU] | ORAL_TABLET | Freq: Every day | ORAL | Status: DC
  Administered 2023-12-15 – 2023-12-20 (×6): 1000 [IU] via ORAL
  Filled 2023-12-15 (×6): qty 1

## 2023-12-15 SURGICAL SUPPLY — 40 items
BAG COUNTER SPONGE SURGICOUNT (BAG) IMPLANT
BLADE SURG 21 STRL SS (BLADE) ×1 IMPLANT
BNDG COHESIVE 4X5 TAN STRL LF (GAUZE/BANDAGES/DRESSINGS) IMPLANT
BNDG COHESIVE 6X5 TAN NS LF (GAUZE/BANDAGES/DRESSINGS) IMPLANT
BNDG COHESIVE 6X5 TAN ST LF (GAUZE/BANDAGES/DRESSINGS) IMPLANT
BNDG GAUZE DERMACEA FLUFF 4 (GAUZE/BANDAGES/DRESSINGS) IMPLANT
CANISTER WOUNDNEG PRESSURE 500 (CANNISTER) IMPLANT
CLEANSER WND VASHE 34 (WOUND CARE) IMPLANT
CLEANSER WND VASHE INSTL 34OZ (WOUND CARE) IMPLANT
COVER SURGICAL LIGHT HANDLE (MISCELLANEOUS) ×2 IMPLANT
DRAPE DERMATAC (DRAPES) IMPLANT
DRAPE U-SHAPE 47X51 STRL (DRAPES) ×1 IMPLANT
DRESSING PREVENA PLUS CUSTOM (GAUZE/BANDAGES/DRESSINGS) IMPLANT
DRESSING VERAFLO CLEANS CC MED (GAUZE/BANDAGES/DRESSINGS) IMPLANT
DRSG ADAPTIC 3X8 NADH LF (GAUZE/BANDAGES/DRESSINGS) ×1 IMPLANT
DRSG VAC PEEL AND PLACE LRG (GAUZE/BANDAGES/DRESSINGS) IMPLANT
DURAPREP 26ML APPLICATOR (WOUND CARE) ×1 IMPLANT
ELECTRODE REM PT RTRN 9FT ADLT (ELECTROSURGICAL) IMPLANT
GAUZE PAD ABD 8X10 STRL (GAUZE/BANDAGES/DRESSINGS) IMPLANT
GAUZE SPONGE 4X4 12PLY STRL (GAUZE/BANDAGES/DRESSINGS) IMPLANT
GLOVE BIOGEL PI IND STRL 9 (GLOVE) ×1 IMPLANT
GLOVE SURG ORTHO 9.0 STRL STRW (GLOVE) ×1 IMPLANT
GOWN STRL REUS W/ TWL XL LVL3 (GOWN DISPOSABLE) ×2 IMPLANT
GRAFT SKIN WND MICRO 38 (Tissue) IMPLANT
KIT BASIN OR (CUSTOM PROCEDURE TRAY) ×1 IMPLANT
KIT TURNOVER KIT B (KITS) ×1 IMPLANT
MANIFOLD NEPTUNE II (INSTRUMENTS) ×1 IMPLANT
PACK ORTHO EXTREMITY (CUSTOM PROCEDURE TRAY) ×1 IMPLANT
PAD ARMBOARD POSITIONER FOAM (MISCELLANEOUS) ×2 IMPLANT
PAD NEG PRESSURE SENSATRAC (MISCELLANEOUS) IMPLANT
SET HNDPC FAN SPRY TIP SCT (DISPOSABLE) IMPLANT
SOLN 0.9% NACL 1000 ML (IV SOLUTION) ×1 IMPLANT
SOLN 0.9% NACL POUR BTL 1000ML (IV SOLUTION) ×1 IMPLANT
STOCKINETTE IMPERVIOUS 9X36 MD (GAUZE/BANDAGES/DRESSINGS) IMPLANT
SUT ETHILON 2 0 PSLX (SUTURE) ×1 IMPLANT
SWAB COLLECTION DEVICE MRSA (MISCELLANEOUS) ×1 IMPLANT
SWAB CULTURE ESWAB REG 1ML (MISCELLANEOUS) IMPLANT
TOWEL GREEN STERILE (TOWEL DISPOSABLE) ×1 IMPLANT
TUBE CONNECTING 12X1/4 (SUCTIONS) ×1 IMPLANT
YANKAUER SUCT BULB TIP NO VENT (SUCTIONS) ×1 IMPLANT

## 2023-12-15 NOTE — Interval H&P Note (Signed)
 History and Physical Interval Note:  12/15/2023 6:38 AM  Logan French Logan French  has presented today for surgery, with the diagnosis of Venous Ulcers Bilateral Legs.  The various methods of treatment have been discussed with the patient and family. After consideration of risks, benefits and other options for treatment, the patient has consented to  Procedure(s) with comments: INCISION AND DRAINAGE OF DEEP ABSCESS, CALF (Bilateral) - BILATERAL LEG DEBRIDEMENT AND TISSUE GRAFT APPLICATION, WOUND VAC (Bilateral) as a surgical intervention.  The patient's history has been reviewed, patient examined, no change in status, stable for surgery.  I have reviewed the patient's chart and labs.  Questions were answered to the patient's satisfaction.     Julianny Milstein V Grason Brailsford

## 2023-12-15 NOTE — Evaluation (Signed)
 Physical Therapy Evaluation Patient Details Name: Logan French MRN: [id number] DOB: August 16, 1934 Today's Date: 12/15/2023  History of Present Illness  Logan French is a 89 y.o. male who presented to [facility] 12/15/23 with a diagnosis of bilateral leg venous ulcers who failed conservative measures and elected for surgical management. Pt s/p excision of deep abscess with wound vac application. PMHx:  prostate cancer, anemia, HTN, HLD, a fib, b12 deficiency, PAD s/p vascular intervention.   Clinical Impression  Pt admitted with above diagnosis. PTA, pt was modI for functional mobility using RW and independent with ADLs/IADLs. He lives with his wife in a two story house with 2 STE. Pt is able to reside on the main level, but there are a couple of steps between the kitchen and living room. Pt currently with functional limitations due to the deficits listed below (see PT Problem List). He performed bed mobility with supervision and required CGA for transfers, gait, and stairs using RW. Pt required assist to manage wound vac lines throughout mobility. Pt will benefit from acute skilled PT to increase his independence and safety with mobility to allow discharge. Recommend OPPT to increase ROM/strength, improve balance, increase activity tolerance, decrease fall risk, and optimize safety and maximize independence with functional mobility.    If plan is discharge home, recommend the following: A little help with walking and/or transfers;A little help with bathing/dressing/bathroom;Assistance with cooking/housework;Assist for transportation;Help with stairs or ramp for entrance   Can travel by private vehicle        Equipment Recommendations None recommended by PT (Pt already has DME)  Recommendations for Other Services       Functional Status Assessment Patient has had a recent decline in their functional status and demonstrates the ability to make significant improvements in function in a reasonable  and predictable amount of time.     Precautions / Restrictions Precautions Precautions: Fall Recall of Precautions/Restrictions: Intact Precaution/Restrictions Comments: Wound Vac BLE Required Braces or Orthoses: Other Brace Other Brace: B post-op shoes Restrictions Weight Bearing Restrictions Per Provider Order: Yes RLE Weight Bearing Per Provider Order: Weight bearing as tolerated LLE Weight Bearing Per Provider Order: Weight bearing as tolerated Other Position/Activity Restrictions: Elevate the legs when at rest to decrease edema.      Mobility  Bed Mobility Overal bed mobility: Needs Assistance Bed Mobility: Supine to Sit     Supine to sit: Used rails, Contact guard     General bed mobility comments: Pt sat up on R side of bed with increased time. Flat bed, but utilized bed rail to pull his trunk upright. Pt brought BLE off EOB. Assist to manage lines.    Transfers Overall transfer level: Needs assistance Equipment used: Rolling walker (2 wheels) Transfers: Sit to/from Stand, Bed to chair/wheelchair/BSC Sit to Stand: Contact guard assist   Step pivot transfers: Contact guard assist       General transfer comment: Pt stood from lowest bed height. Demonstrated proper hand placement using RW. Powered up with light assist. Transferred to recliner chair. Good eccentric control. Assist to manage lines.    Ambulation/Gait Ambulation/Gait assistance: Contact guard assist Gait Distance (Feet): 100 Feet (x2) Assistive device: Rolling walker (2 wheels) Gait Pattern/deviations: Step-through pattern, Decreased stride length, Trunk flexed, Wide base of support Gait velocity: decreased Gait velocity interpretation: <1.8 ft/sec, indicate of risk for recurrent falls   General Gait Details: Pt ambulated with a step-through gait pattern. Even weight shift and adequate foot clearence. Pt demonstrated a slight  fwd lean. Cued upright posture and closer proximity to RW. Wound Vacs  placed on R/L bars of AD. Assist to manage lines.  Stairs Stairs: Yes Stairs assistance: Contact guard assist, +2 safety/equipment Stair Management: Two rails, Forwards, Step to pattern, Sideways, One rail Left Number of Stairs: 2 (x2) General stair comments: Pt ascended/descended steps one at a time. On the first rep he went forwards while going up/down. Assist to manage lines. On the second rep he went forwards up and sideways down with BUE support on unilateral rail. Pt slightly unsteady.  Wheelchair Mobility     Tilt Bed    Modified Rankin (Stroke Patients Only)       Balance Overall balance assessment: Needs assistance Sitting-balance support: No upper extremity supported, Feet supported Sitting balance-Leahy Scale: Good     Standing balance support: Bilateral upper extremity supported, During functional activity, Reliant on assistive device for balance Standing balance-Leahy Scale: Poor Standing balance comment: Pt dependent on RW                             Pertinent Vitals/Pain Pain Assessment Pain Assessment: No/denies pain    Home Living Family/patient expects to be discharged to:: Private residence Living Arrangements: Spouse/significant other Available Help at Discharge: Family;Available PRN/intermittently;Home health (Pt reports he has a private nurse that comes every morning or every other morning to provide wound care.) Type of Home: House Home Access: Stairs to enter Entrance Stairs-Rails: Can reach both;Left;Right Entrance Stairs-Number of Steps: 2 Alternate Level Stairs-Number of Steps: 2 steps down into kitchen/living room with 1 railing. Main level has bedroom/bathrooms (2)/office and an upstairs that pt does not have to use. Home Layout: Two level;Able to live on main level with bedroom/bathroom Home Equipment: Cane - single point;Rollator (4 wheels);Rolling Walker (2 wheels);Shower seat;Hand held shower head;Toilet riser;Grab bars -  toilet;Grab bars - tub/shower Additional Comments: Pt reports he ios    Prior Function Prior Level of Function : Independent/Modified Independent;Driving;History of Falls (last six months)             Mobility Comments: Ambulates using RW. 2 falls in the past 63mo d/t LOB. ADLs Comments: Indep ADLs/IADLs.     Extremity/Trunk Assessment   Upper Extremity Assessment Upper Extremity Assessment: Defer to OT evaluation    Lower Extremity Assessment Lower Extremity Assessment: Generalized weakness;RLE deficits/detail;LLE deficits/detail RLE Deficits / Details: Pt POD 0 s/p abscess excision. He is in a dressing from inferior knee to toes. Hip AROM WFL. Decreased knee and limited ankle AROM. Grossly 3/5 strength. Edema. RLE Sensation: decreased proprioception RLE Coordination: decreased gross motor LLE Deficits / Details: Pt POD 0 s/p abscess excision. He is in a dressing from inferior knee to toes. Hip AROM WFL. Decreased knee and limited ankle AROM. Grossly 3/5 strength. Edema. LLE Sensation: decreased proprioception LLE Coordination: decreased gross motor    Cervical / Trunk Assessment Cervical / Trunk Assessment: Kyphotic  Communication   Communication Communication: No apparent difficulties    Cognition Arousal: Alert Behavior During Therapy: WFL for tasks assessed/performed   PT - Cognitive impairments: No apparent impairments                       PT - Cognition Comments: Pt A,Ox4 Following commands: Intact       Cueing Cueing Techniques: Verbal cues     General Comments General comments (skin integrity, edema, etc.): BLE edema    Exercises  Assessment/Plan    PT Assessment Patient needs continued PT services  PT Problem List Decreased strength;Decreased activity tolerance;Decreased balance;Decreased mobility;Decreased knowledge of precautions       PT Treatment Interventions DME instruction;Gait training;Stair training;Functional mobility  training;Therapeutic activities;Therapeutic exercise;Balance training;Patient/family education    PT Goals (Current goals can be found in the Care Plan section)  Acute Rehab PT Goals Patient Stated Goal: Regain independence and have less pain PT Goal Formulation: With patient Time For Goal Achievement: 12/29/23 Potential to Achieve Goals: Good    Frequency Min 2X/week     Co-evaluation               AM-PAC PT 6 Clicks Mobility  Outcome Measure Help needed turning from your back to your side while in a flat bed without using bedrails?: A Little Help needed moving from lying on your back to sitting on the side of a flat bed without using bedrails?: A Little Help needed moving to and from a bed to a chair (including a wheelchair)?: A Little Help needed standing up from a chair using your arms (e.g., wheelchair or bedside chair)?: A Little Help needed to walk in hospital room?: A Little Help needed climbing 3-5 steps with a railing? : A Little 6 Click Score: 18    End of Session Equipment Utilized During Treatment: Gait belt Activity Tolerance: Patient tolerated treatment well Patient left: in chair;with call bell/phone within reach;with chair alarm set Nurse Communication: Mobility status PT Visit Diagnosis: Other abnormalities of gait and mobility (R26.89)    Time: 8660-8587 PT Time Calculation (min) (ACUTE ONLY): 33 min   Charges:   PT Evaluation $PT Eval Moderate Complexity: 1 Mod PT Treatments $Gait Training: 8-22 mins PT General Charges $$ ACUTE PT VISIT: 1 Visit         Randall SAUNDERS, PT, DPT Acute Rehabilitation Services Office: [phone] Secure Chat Preferred  Delon CHRISTELLA Callander 12/15/2023, 3:10 PM

## 2023-12-15 NOTE — Anesthesia Procedure Notes (Signed)
 Procedure Name: LMA Insertion Date/Time: 12/15/2023 8:37 AM  Performed by: Scherrie Mast, CRNAPre-anesthesia Checklist: Patient identified, Emergency Drugs available, Suction available and Patient being monitored Patient Re-evaluated:Patient Re-evaluated prior to induction Oxygen Delivery Method: Circle System Utilized Preoxygenation: Pre-oxygenation with 100% oxygen Induction Type: IV induction LMA: LMA inserted LMA Size: 5.0 Number of attempts: 1 Airway Equipment and Method: Bite block Placement Confirmation: positive ETCO2 Tube secured with: Tape Dental Injury: Teeth and Oropharynx as per pre-operative assessment  Comments: LMA placed by NORVA Speed under MD/DO and CRNA supervision.

## 2023-12-15 NOTE — Anesthesia Postprocedure Evaluation (Signed)
 Anesthesia Post Note  Patient: Logan French  Procedure(s) Performed: BILATERAL LEG INCISION AND DRAINAGE OF DEEP ABSCESS (Bilateral: Leg Lower) APPLICATION, WOUND VAC (Bilateral: Leg Lower)     Patient location during evaluation: PACU Anesthesia Type: General Level of consciousness: awake and alert Pain management: pain level controlled Vital Signs Assessment: post-procedure vital signs reviewed and stable Respiratory status: spontaneous breathing, nonlabored ventilation, respiratory function stable and patient connected to nasal cannula oxygen Cardiovascular status: blood pressure returned to baseline and stable Postop Assessment: no apparent nausea or vomiting Anesthetic complications: no   There were no known notable events for this encounter.  Last Vitals:  Vitals:   12/15/23 1100 12/15/23 1119  BP: 105/66 116/68  Pulse: 69 76  Resp: 12 17  Temp: 36.5 C (!) 36.4 C  SpO2: 94% 92%    Last Pain:  Vitals:   12/15/23 1045  PainSc: 3                  Lynwood [name]

## 2023-12-15 NOTE — Transfer of Care (Signed)
 Immediate Anesthesia Transfer of Care Note  Patient: Logan French  Procedure(s) Performed: BILATERAL LEG INCISION AND DRAINAGE OF DEEP ABSCESS (Bilateral: Leg Lower) APPLICATION, WOUND VAC (Bilateral: Leg Lower)  Patient Location: PACU  Anesthesia Type:General  Level of Consciousness: awake, alert , and oriented  Airway & Oxygen Therapy: Patient Spontanous Breathing and Patient connected to nasal cannula oxygen  Post-op Assessment: Report given to RN and Post -op Vital signs reviewed and stable  Post vital signs: Reviewed and stable  Last Vitals:  Vitals Value Taken Time  BP 108/72 12/15/23 09:45  Temp 36.6 C 12/15/23 09:18  Pulse 63 12/15/23 09:58  Resp 10 12/15/23 09:58  SpO2 98 % 12/15/23 09:58  Vitals shown include unfiled device data.  Last Pain:  Vitals:   12/15/23 0945  PainSc: 3       Patients Stated Pain Goal: 2 (12/15/23 0737)  Complications: No notable events documented.

## 2023-12-15 NOTE — Anesthesia Preprocedure Evaluation (Signed)
 Anesthesia Evaluation  Patient identified by MRN, date of birth, ID band Patient awake    Reviewed: Allergy & Precautions, NPO status , Patient's Chart, lab work & pertinent test results, reviewed documented beta blocker date and time   History of Anesthesia Complications Negative for: history of anesthetic complications  Airway Mallampati: II  TM Distance: >3 FB     Dental no notable dental hx.    Pulmonary neg COPD   breath sounds clear to auscultation       Cardiovascular hypertension, + Peripheral Vascular Disease  + dysrhythmias Atrial Fibrillation  Rhythm:Regular Rate:Normal  IMPRESSIONS     1. Left ventricular ejection fraction, by estimation, is 50%. The left  ventricle has mildly decreased function. The left ventricle demonstrates  regional wall motion abnormalities with suspected basal to mid  inferolateral and basal anterolateral  hypokinesis. However, endocardial definition was poor. Definity contrast  may have helped but was not used. The left ventricular internal cavity  size was mildly dilated. Left ventricular diastolic parameters are  indeterminate.   2. Right ventricular systolic function is mildly reduced. The right  ventricular size is normal.   3. Left atrial size was mildly dilated.   4. The mitral valve is normal in structure. Mild mitral valve  regurgitation. No evidence of mitral stenosis.   5. The aortic valve is tricuspid. There is moderate calcification of the  aortic valve. Aortic valve regurgitation is trivial. Aortic valve  sclerosis/calcification is present, without any evidence of aortic  stenosis.   6. Aortic dilatation noted. There is mild dilatation of the aortic root,  measuring 39 mm.   7. The IVC was not visualized.   8. No definite valvular vegetation was visualized, but technically  difficult study, and if endocarditis is suspected will need TEE.      Neuro/Psych neg Seizures  Summary:  Right Carotid: Velocities in the right ICA are consistent with a 1-39%  stenosis.   Left Carotid: Velocities in the left ICA are consistent with a 60-79%  stenosis.      GI/Hepatic   Endo/Other  diabetes    Renal/GU Renal disease     Musculoskeletal   Abdominal   Peds  Hematology  (+) Blood dyscrasia, anemia   Anesthesia Other Findings   Reproductive/Obstetrics                              Anesthesia Physical Anesthesia Plan  ASA: 3  Anesthesia Plan: General   Post-op Pain Management:    Induction: Intravenous  PONV Risk Score and Plan: 2 and Ondansetron  and Dexamethasone  Airway Management Planned: LMA  Additional Equipment:   Intra-op Plan:   Post-operative Plan: Extubation in OR  Informed Consent: I have reviewed the patients History and Physical, chart, labs and discussed the procedure including the risks, benefits and alternatives for the proposed anesthesia with the patient or authorized representative who has indicated his/her understanding and acceptance.     Dental advisory given  Plan Discussed with: CRNA  Anesthesia Plan Comments:          Anesthesia Quick Evaluation

## 2023-12-15 NOTE — Op Note (Signed)
 12/15/2023  9:23 AM  PATIENT:  Logan French Melba Mickey    PRE-OPERATIVE DIAGNOSIS:  Venous Ulcers Bilateral Legs  POST-OPERATIVE DIAGNOSIS:  Same  PROCEDURE:  BILATERAL LEG EXCISION DEEP ABSCESS, APPLICATION of wound VAC bilateral lower extremities Application Kerecis micro graft 38 cm x 2 to cover wound surface area greater than 100 cm. Tissue sent for cultures from both legs.  SURGEON:  Jerona LULLA Sage, MD  PHYSICIAN ASSISTANT:None ANESTHESIA:   General  PREOPERATIVE INDICATIONS:  Jamere Stidham is a  89 y.o. male with a diagnosis of Venous Ulcers Bilateral Legs who failed conservative measures and elected for surgical management.    The risks benefits and alternatives were discussed with the patient preoperatively including but not limited to the risks of infection, bleeding, nerve injury, cardiopulmonary complications, the need for revision surgery, among others, and the patient was willing to proceed.  OPERATIVE IMPLANTS:   Implant Name Type Inv. Item Serial No. Manufacturer Lot No. LRB No. Used Action  GRAFT SKIN WND MICRO 38 - [id number] Tissue GRAFT SKIN WND MICRO 38  [employer] [id number] Left 1 Implanted  GRAFT SKIN WND MICRO 38 - [id number] Tissue GRAFT SKIN WND MICRO 38  [employer] [id number] Right 1 Implanted    @ENCIMAGES @  OPERATIVE FINDINGS: Patient had exposed Achilles tendon bilaterally with good petechial bleeding in all wounds after debridement.  Tissue sent for cultures.  OPERATIVE PROCEDURE: Patient brought the operating room and underwent general anesthetic.  After adequate levels anesthesia were obtained both lower extremities were prepped using DuraPrep draped into a sterile field a timeout was called.  Attention was first focused on the right lower extremity.  Patient had 3 ulcers that were excisionally debrided right lower extremity.  A 10 blade knife was used to excise skin and soft tissue fascia and tendon from all 3 wounds.  The proximal medial  wound was 3 cm in diameter lateral wound was 2 cm in diameter and posterior wound was 10 x 5 cm.  The wounds had good petechial bleeding.  The wound was irrigated with Vashe.  The wound beds were not covered with Kerecis micro graft 38 cm, and the wound surface area greater than 50 cm.  the cleanse choice sponge was contoured to the shape of the 3 wounds and this was then covered with a peel and place wound VAC.  This had a good suction fit.  Attention was then focused on the left lower extremity.  A 10 blade knife was used to excise skin soft tissue fascia and tendon from the ulcers of the left lower extremity.  The posterior Achilles wound measures 14 x 7 cm.  The proximal lateral wound is 4 cm in diameter.  There was exposed Achilles tendon but there was good petechial bleeding.  The wounds were irrigated with Vashe.  Kerecis micro graft 38 cm was applied to the wounds to cover wound surface area greater than 100 cm.  this was covered with the cleanse choice wound VAC sponge and the peel in place sponge.  This had a good suction fit both lower extremities were then overwrapped with Coban patient was extubated taken the PACU in stable condition.  Tissue from each extremity was sent for separate cultures.   DISCHARGE PLANNING:  Antibiotic duration: Will continue the wound vacs and IV antibiotics until cultures are finalized.  Weightbearing: Weightbearing as tolerated  Pain medication: Opioid pathway  Dressing care/ Wound VAC: Discharge with the Prevena plus portable wound VAC pump  Ambulatory  devices: Walker  Discharge to: Discharge planning based on therapy recommendations.  Follow-up: In the office 1 week post operative.

## 2023-12-15 NOTE — Progress Notes (Signed)
 Orthopedic Tech Progress Note Patient Details:  Logan French May 10, 1934 [id number]  Ortho Devices Type of Ortho Device: Postop shoe/boot Ortho Device/Splint Location: BLE Ortho Device/Splint Interventions: Ordered, Application, Adjustment   Post Interventions Patient Tolerated: Well, Ambulated well Instructions Provided: Poper ambulation with device, Care of device  [name] Pac 12/15/2023, 2:25 PM

## 2023-12-16 ENCOUNTER — Encounter (HOSPITAL_COMMUNITY): Payer: Self-pay | Admitting: Orthopedic Surgery

## 2023-12-16 NOTE — Plan of Care (Signed)
   Problem: Education: Goal: Knowledge of General Education information will improve Description Including pain rating scale, medication(s)/side effects and non-pharmacologic comfort measures Outcome: Progressing   Problem: Education: Goal: Knowledge of General Education information will improve Description Including pain rating scale, medication(s)/side effects and non-pharmacologic comfort measures Outcome: Progressing

## 2023-12-16 NOTE — Progress Notes (Signed)
 Patient ID: Logan French, male   DOB: [birth year], 89 y.o.   MRN: [id number] Patient is postoperative day 1 debridement venous ulcers bilateral lower extremities.  There is 100 cc in the wound VAC canister on the right and 250 cc in the wound VAC canister on the left.  Wound cultures are showing gram-positive cocci and gram-negative rods.  Anticipate discharge on Monday with portable Prevena pumps.  Will adjust antibiotics based on culture sensitivities.  Discussed with the patient importance of elevating his feet above his heart when he is not ambulating.

## 2023-12-16 NOTE — Evaluation (Signed)
 Occupational Therapy Evaluation Patient Details Name: Logan French MRN: [id number] DOB: 1934-07-24 Today's Date: 12/16/2023   History of Present Illness   Logan French is a 89 y.o. male who presented to [facility] 12/15/23 with a diagnosis of bilateral leg venous ulcers who failed conservative measures and elected for surgical management. Pt s/p excision of deep abscess with wound vac application. PMHx:  prostate cancer, anemia, HTN, HLD, a fib, b12 deficiency, PAD s/p vascular intervention.     Clinical Impressions Pt was walking with a RW and reports functioning modified independently in ADLs prior to admission. He had assistance for wound care and IADLs. He can drive, but his family prefers he does not. Pt presents with impaired balance and LE pain. He requires set up to min assist for ADLs. He walked x 20 minutes in halls with supervision and RW without rest breaks. No follow up OT is anticipated. Will follow acutely.      If plan is discharge home, recommend the following:   A little help with walking and/or transfers;A little help with bathing/dressing/bathroom;Assistance with cooking/housework;Assist for transportation;Help with stairs or ramp for entrance     Functional Status Assessment   Patient has had a recent decline in their functional status and demonstrates the ability to make significant improvements in function in a reasonable and predictable amount of time.     Equipment Recommendations   None recommended by OT     Recommendations for Other Services         Precautions/Restrictions   Precautions Precautions: Fall Recall of Precautions/Restrictions: Intact Precaution/Restrictions Comments: Wound Vac BLE Required Braces or Orthoses: Other Brace Other Brace: B post-op shoes Restrictions Weight Bearing Restrictions Per Provider Order: Yes RLE Weight Bearing Per Provider Order: Weight bearing as tolerated LLE Weight Bearing Per Provider Order:  Weight bearing as tolerated Other Position/Activity Restrictions: Elevate the legs when at rest to decrease edema.     Mobility Bed Mobility Overal bed mobility: Needs Assistance Bed Mobility: Sit to Supine       Sit to supine: Supervision   General bed mobility comments: assist for lines    Transfers Overall transfer level: Needs assistance Equipment used: Rolling walker (2 wheels) Transfers: Sit to/from Stand Sit to Stand: Supervision                  Balance Overall balance assessment: Needs assistance   Sitting balance-Leahy Scale: Good     Standing balance support: Bilateral upper extremity supported, During functional activity, Reliant on assistive device for balance Standing balance-Leahy Scale: Poor Standing balance comment: Pt dependent on RW                           ADL either performed or assessed with clinical judgement   ADL Overall ADL's : Needs assistance/impaired Eating/Feeding: Independent;Sitting   Grooming: Standing;Supervision/safety   Upper Body Bathing: Set up;Sitting   Lower Body Bathing: Minimal assistance;Sit to/from stand   Upper Body Dressing : Set up;Sitting   Lower Body Dressing: Sitting/lateral leans;Minimal assistance Lower Body Dressing Details (indicate cue type and reason): for shoes Toilet Transfer: Supervision/safety;Ambulation;Rolling walker (2 wheels)           Functional mobility during ADLs: Supervision/safety;Rolling walker (2 wheels)       Vision Ability to See in Adequate Light: 0 Adequate Patient Visual Report: No change from baseline       Perception  Praxis         Pertinent Vitals/Pain Pain Assessment Pain Assessment: Faces Faces Pain Scale: Hurts a little bit Pain Location: B LEs Pain Descriptors / Indicators: Discomfort Pain Intervention(s): Monitored during session, Repositioned     Extremity/Trunk Assessment Upper Extremity Assessment Upper Extremity  Assessment: Overall WFL for tasks assessed   Lower Extremity Assessment Lower Extremity Assessment: Defer to PT evaluation   Cervical / Trunk Assessment Cervical / Trunk Assessment: Kyphotic   Communication Communication Communication: No apparent difficulties   Cognition Arousal: Alert Behavior During Therapy: WFL for tasks assessed/performed Cognition: No apparent impairments                               Following commands: Intact       Cueing  General Comments   Cueing Techniques: Verbal cues      Exercises     Shoulder Instructions      Home Living Family/patient expects to be discharged to:: Private residence Living Arrangements: Spouse/significant other Available Help at Discharge: Family;Home health;Available 24 hours/day (private nurse helps with wound care) Type of Home: House Home Access: Stairs to enter [employer] of Steps: 2 Entrance Stairs-Rails: Can reach both;Left;Right Home Layout: Two level;Able to live on main level with bedroom/bathroom Alternate Level Stairs-Number of Steps: 2 steps down into kitchen/living room with 1 railing. Main level has bedroom/bathrooms (2)/office and an upstairs that pt does not have to use.   Bathroom Shower/Tub: Psychologist, counselling;Door   [employer] Toilet: Standard     Home Equipment: Cane - single point;Rollator (4 wheels);Rolling Walker (2 wheels);Shower seat;Hand held shower head;Toilet riser;Grab bars - toilet;Grab bars - tub/shower          Prior Functioning/Environment Prior Level of Function : Independent/Modified Independent;History of Falls (last six months)             Mobility Comments: Ambulates using RW. 2 falls in the past 65mo d/t LOB. ADLs Comments: Indep ADLs/IADLs.    OT Problem List: Impaired balance (sitting and/or standing)   OT Treatment/Interventions: Self-care/ADL training;DME and/or AE instruction;Therapeutic activities;Patient/family education;Balance training       OT Goals(Current goals can be found in the care plan section)   Acute Rehab OT Goals OT Goal Formulation: With patient Time For Goal Achievement: 12/30/23 Potential to Achieve Goals: Good ADL Goals Pt Will Perform Grooming: with modified independence;standing Pt Will Perform Lower Body Bathing: with modified independence;sit to/from stand Pt Will Perform Lower Body Dressing: with modified independence;sit to/from stand Pt Will Transfer to Toilet: with modified independence;ambulating Pt Will Perform Toileting - Clothing Manipulation and hygiene: with modified independence;sit to/from stand   OT Frequency:  Min 2X/week    Co-evaluation              AM-PAC OT 6 Clicks Daily Activity     Outcome Measure Help from another person eating meals?: None Help from another person taking care of personal grooming?: A Little Help from another person toileting, which includes using toliet, bedpan, or urinal?: A Little Help from another person bathing (including washing, rinsing, drying)?: A Little Help from another person to put on and taking off regular upper body clothing?: A Little Help from another person to put on and taking off regular lower body clothing?: A Little 6 Click Score: 19   End of Session Equipment Utilized During Treatment: Gait belt;Rolling walker (2 wheels) Nurse Communication: Mobility status  Activity Tolerance: Patient tolerated treatment well Patient  left: in bed;with call bell/phone within reach  OT Visit Diagnosis: Unsteadiness on feet (R26.81);Other abnormalities of gait and mobility (R26.89);Pain;Muscle weakness (generalized) (M62.81)                Time: 1521-1550 OT Time Calculation (min): 29 min Charges:  OT General Charges $OT Visit: 1 Visit OT Evaluation $OT Eval Moderate Complexity: 1 Mod OT Treatments $Therapeutic Activity: 8-22 mins  Mliss HERO, OTR/L Acute Rehabilitation Services Office: [phone]   Kennth Mliss Helling 12/16/2023, 4:02 PM

## 2023-12-17 NOTE — Plan of Care (Signed)

## 2023-12-17 NOTE — Progress Notes (Signed)
 Physical Therapy Treatment Patient Details Name: Logan French MRN: [id number] DOB: 11-17-1934 Today's Date: 12/17/2023   History of Present Illness Logan French is a 89 y.o. male who presented to [facility] 12/15/23 with a diagnosis of bilateral leg venous ulcers who failed conservative measures and elected for surgical management. Pt s/p excision of deep abscess with wound vac application. PMHx:  prostate cancer, anemia, HTN, HLD, a fib, b12 deficiency, PAD s/p vascular intervention.    PT Comments  Pt greeted supine in bed, pleasant and agreeable to PT session. He engaged in gait and stair training. Pt ambulated around the entire floor unit using RW with CGA. He utilized a step-through gait pattern. Pt demonstrated slight trunk flex, cues for closer proximity to RW and to improve upright posture. He was able to correct and safely navigate within room/hallway. Pt ascended steps forwards with BUE support on railings and descended sideways with BUE support on unilateral handrail. He performed 3x2 standard height (6) and 2x3 short (4) steps. Utilized +2 assist throughout session for management of IV pole and wound vac lines. Will continue to follow acutely and advance appropriately.      If plan is discharge home, recommend the following: A little help with walking and/or transfers;A little help with bathing/dressing/bathroom;Assistance with cooking/housework;Assist for transportation;Help with stairs or ramp for entrance   Can travel by private vehicle        Equipment Recommendations  None recommended by PT    Recommendations for Other Services       Precautions / Restrictions Precautions Precautions: Fall Recall of Precautions/Restrictions: Intact Precaution/Restrictions Comments: Wound Vac BLE Required Braces or Orthoses: Other Brace Other Brace: B post-op shoes Restrictions Weight Bearing Restrictions Per Provider Order: Yes RLE Weight Bearing Per Provider Order: Weight  bearing as tolerated LLE Weight Bearing Per Provider Order: Weight bearing as tolerated Other Position/Activity Restrictions: Elevate the legs when at rest to decrease edema.     Mobility  Bed Mobility Overal bed mobility: Needs Assistance Bed Mobility: Supine to Sit     Supine to sit: Supervision     General bed mobility comments: Pt sat up on L side of bed with increased time. Assist for line management.    Transfers Overall transfer level: Needs assistance Equipment used: Rolling walker (2 wheels) Transfers: Sit to/from Stand Sit to Stand: Supervision   Step pivot transfers: Supervision, +2 safety/equipment (IV pole & Wound Vac management)       General transfer comment: Pt utilized momentum to power up into stand. Increased time to reach upright posture. Assist to manage lines. Good eccentric control.    Ambulation/Gait Ambulation/Gait assistance: Contact guard assist, +2 safety/equipment (IV pole & Wound Vac management) Gait Distance (Feet): 1250 Feet Assistive device: Rolling walker (2 wheels) Gait Pattern/deviations: Step-through pattern, Decreased stride length, Trunk flexed, Wide base of support Gait velocity: decreased Gait velocity interpretation: <1.8 ft/sec, indicate of risk for recurrent falls   General Gait Details: Pt ambulated with slow steady steps. He demonstrated a fwd lean, cues for proximity to RW with emphasis on maintaining body inside AD at all times. Pt navigated room/hallway well. No LOB.   Stairs Stairs: Yes Stairs assistance: Contact guard assist, +2 safety/equipment (IV pole & Wound Vac management) Stair Management: Two rails, Forwards, Step to pattern, Sideways, One rail Right Number of Stairs: 5 (3x2 standard height steps; 2x3 shorter steps) General stair comments: Pt ascended forwards with BUE support on railings taking one step at a time. Pt descended  sideways with BUE support on unilateral rail. Assist to manage lines. Pt maintained a  slow steady space, carefully transitioning between various heights. He went back and forth over stairs in ortho gym and then went up the standard steps, turned around, and came back down.   Wheelchair Mobility     Tilt Bed    Modified Rankin (Stroke Patients Only)       Balance Overall balance assessment: Needs assistance Sitting-balance support: No upper extremity supported, Feet supported Sitting balance-Leahy Scale: Good     Standing balance support: Bilateral upper extremity supported, During functional activity, Reliant on assistive device for balance Standing balance-Leahy Scale: Poor Standing balance comment: Pt dependent on RW                            Communication Communication Communication: No apparent difficulties  Cognition Arousal: Alert Behavior During Therapy: WFL for tasks assessed/performed   PT - Cognitive impairments: No apparent impairments                         Following commands: Intact      Cueing Cueing Techniques: Verbal cues  Exercises      General Comments General comments (skin integrity, edema, etc.): NT reported she took pt on 4 loops around the front part of the unit this morning.      Pertinent Vitals/Pain Pain Assessment Pain Assessment: Faces Faces Pain Scale: Hurts little more Pain Location: BLEs Pain Descriptors / Indicators: Throbbing, Aching, Discomfort    Home Living                          Prior Function            PT Goals (current goals can now be found in the care plan section) Acute Rehab PT Goals Patient Stated Goal: Return Home safely PT Goal Formulation: With patient Time For Goal Achievement: 12/29/23 Potential to Achieve Goals: Good Progress towards PT goals: Progressing toward goals    Frequency    Min 2X/week      PT Plan      Co-evaluation              AM-PAC PT 6 Clicks Mobility   Outcome Measure  Help needed turning from your back to your  side while in a flat bed without using bedrails?: A Little Help needed moving from lying on your back to sitting on the side of a flat bed without using bedrails?: A Little Help needed moving to and from a bed to a chair (including a wheelchair)?: A Little Help needed standing up from a chair using your arms (e.g., wheelchair or bedside chair)?: A Little Help needed to walk in hospital room?: A Little Help needed climbing 3-5 steps with a railing? : A Little 6 Click Score: 18    End of Session Equipment Utilized During Treatment: Gait belt Activity Tolerance: Patient tolerated treatment well Patient left: in chair;with call bell/phone within reach;Other (comment) (LEs elevated on pillow) Nurse Communication: Mobility status;Other (comment) (lack of green chair alarm present in pt's room. RN aware and okay) PT Visit Diagnosis: Other abnormalities of gait and mobility (R26.89)     Time: 8691-8650 PT Time Calculation (min) (ACUTE ONLY): 41 min  Charges:    $Gait Training: 38-52 mins PT General Charges $$ ACUTE PT VISIT: 1 Visit  Randall SAUNDERS, PT, DPT Acute Rehabilitation Services Office: [phone] Secure Chat Preferred  Delon CHRISTELLA Callander 12/17/2023, 2:28 PM

## 2023-12-17 NOTE — Care Management Important Message (Signed)
 Important Message  Patient Details  Name: Logan French MRN: [id number] Date of Birth: [birth year]   Important Message Given:  Yes - Medicare IM     Jon Cruel 12/17/2023, 3:29 PM

## 2023-12-17 NOTE — Progress Notes (Addendum)
 [facility]   POD # 2 debridement venous ulcers bilateral lower extremities.    TISSUE   Special Requests LEFT LEG WD  Gram Stain RARE WBC PRESENT, PREDOMINANTLY PMN ABUNDANT GRAM POSITIVE COCCI RARE GRAM NEGATIVE RODS  Culture MODERATE PROTEUS MIRABILIS    FEW WBC PRESENT, PREDOMINANTLY PMN ABUNDANT GRAM NEGATIVE RODS   Pending final culture.  Will adjust antibiotics based on culture sensitivities. He is currently on Unasyn IV antibiotics q 6.   Vac OP 350 total in #1 and 250 total in #2 bloody drainage.  Vacs with good suction.  I will order an H/H for the am He did walk 3 times yesterday and is pleased with the decreased swelling in both legs.   Plan to discharge home Monday Hospital bed for home has been ordered Elevation of B LE  Maurilio Deland Collet PA-C [phone]

## 2023-12-18 LAB — HEMOGLOBIN AND HEMATOCRIT, BLOOD
HCT: 36.8 % — ABNORMAL LOW (ref 39.0–52.0)
Hemoglobin: 11.1 g/dL — ABNORMAL LOW (ref 13.0–17.0)

## 2023-12-18 MED ORDER — PIPERACILLIN-TAZOBACTAM 3.375 G IVPB
3.3750 g | Freq: Three times a day (TID) | INTRAVENOUS | Status: DC
  Administered 2023-12-18 – 2023-12-20 (×7): 3.375 g via INTRAVENOUS
  Filled 2023-12-18 (×7): qty 50

## 2023-12-18 NOTE — Plan of Care (Signed)

## 2023-12-18 NOTE — Progress Notes (Signed)
 Occupational Therapy Treatment Patient Details Name: Logan French MRN: [id number] DOB: October 26, 1934 Today's Date: 12/18/2023   History of present illness Logan French is a 89 y.o. male who presented to [facility] 12/15/23 with a diagnosis of bilateral leg venous ulcers who failed conservative measures and elected for surgical management. Pt s/p excision of deep abscess with wound vac application. PMHx:  prostate cancer, anemia, HTN, HLD, a fib, b12 deficiency, PAD s/p vascular intervention.   OT comments  Pt. Seen for skilled OT treatment session.  Session with focus on bed mobility and short distance ambulation for transfer to/from b.room.  pt. Progressing well and is eager for increasing ambulation and mobility.  Cont. With current d/c recommendations and acute OT POC.        If plan is discharge home, recommend the following:  A little help with walking and/or transfers;A little help with bathing/dressing/bathroom;Assistance with cooking/housework;Assist for transportation;Help with stairs or ramp for entrance   Equipment Recommendations  None recommended by OT    Recommendations for Other Services      Precautions / Restrictions Precautions Precautions: Fall Recall of Precautions/Restrictions: Intact Precaution/Restrictions Comments: Wound Vac BLE Required Braces or Orthoses: Other Brace Other Brace: B post-op shoes Restrictions RLE Weight Bearing Per Provider Order: Weight bearing as tolerated LLE Weight Bearing Per Provider Order: Weight bearing as tolerated Other Position/Activity Restrictions: Elevate the legs when at rest to decrease edema.       Mobility Bed Mobility Overal bed mobility: Needs Assistance Bed Mobility: Supine to Sit     Supine to sit: Supervision Sit to supine: Supervision   General bed mobility comments: hob elevated for in/out of bed, pt. with not physical assistance for management of BLEs    Transfers Overall transfer level: Needs  assistance Equipment used: Rolling walker (2 wheels) Transfers: Sit to/from Stand, Bed to chair/wheelchair/BSC Sit to Stand: Supervision     Step pivot transfers: Supervision     General transfer comment: Pt utilized momentum to power up into stand. Increased time to reach upright posture. Assist to manage lines. Good eccentric control. pt. with good standing balance while therapist asst. had to move and manage equipment during in/out of b.room and during turning/pivots to surfaces to keep lines on correct side and not tangled.     Balance                                           ADL either performed or assessed with clinical judgement   ADL Overall ADL's : Needs assistance/impaired                       Lower Body Dressing Details (indicate cue type and reason): pt. in partial long sitting, able to access BLES while sitting up in bed, w/o bending forward while seated eob. discussing about feedig the lines through lower garments once home with portable wound vacs.   pt. wore no slip socks during ambulation to/from b.room. rn states that is ok as pt. Had completed short distance ambulation to/from b.room earlier today with no slip socks also w/o post op shoes Toilet Transfer: Supervision/safety;Ambulation;Rolling walker (2 wheels) Toilet Transfer Details (indicate cue type and reason): denied need for use, ambulation in and around the b.room then exited         Functional mobility during ADLs: Supervision/safety;Rolling walker (2 wheels) General ADL  Comments: no physical assistnace for RW management, only for managment of B wound vacs and IV pole during ambualtion and pivots    Extremity/Trunk Assessment              Vision       Perception     Praxis     Communication Communication Communication: No apparent difficulties   Cognition Arousal: Alert Behavior During Therapy: WFL for tasks assessed/performed Cognition: No apparent  impairments                               Following commands: Intact        Cueing   Cueing Techniques: Verbal cues  Exercises      Shoulder Instructions       General Comments      Pertinent Vitals/ Pain       Pain Assessment Pain Assessment: No/denies pain  Home Living                                          Prior Functioning/Environment              Frequency  Min 2X/week        Progress Toward Goals  OT Goals(current goals can now be found in the care plan section)  Progress towards OT goals: Progressing toward goals     Plan      Co-evaluation                 AM-PAC OT 6 Clicks Daily Activity     Outcome Measure   Help from another person eating meals?: None Help from another person taking care of personal grooming?: A Little Help from another person toileting, which includes using toliet, bedpan, or urinal?: A Little Help from another person bathing (including washing, rinsing, drying)?: A Little Help from another person to put on and taking off regular upper body clothing?: A Little Help from another person to put on and taking off regular lower body clothing?: A Little 6 Click Score: 19    End of Session Equipment Utilized During Treatment: Gait belt;Rolling walker (2 wheels)  OT Visit Diagnosis: Unsteadiness on feet (R26.81);Other abnormalities of gait and mobility (R26.89);Pain;Muscle weakness (generalized) (M62.81)   Activity Tolerance Patient tolerated treatment well   Patient Left in bed;with call bell/phone within reach;with bed alarm set   Nurse Communication Other (comment);Mobility status (rn states ok to work with pt. and states he can have grape juice he was requesting at end of session.)        Time: 1330-1403 OT Time Calculation (min): 33 min  Charges: OT General Charges $OT Visit: 1 Visit OT Treatments $Self Care/Home Management : 23-37 mins  Randall, COTA/L Acute  Rehabilitation [phone]   CHRISTELLA Nest Lorraine-COTA/L  12/18/2023, 2:51 PM

## 2023-12-19 NOTE — Progress Notes (Signed)
 Patient ID: Logan French, male   DOB: 1934-12-25, 89 y.o.   MRN: [id number] Patient is postoperative day 3 debridement venous ulcers bilateral lower extremities.  Final susceptibility pending. Currently on Zosyn , Linezolid .  Vac OP 350 total in #1 and 250 total in #2 bloody drainage.  Vacs with good suction. Has been up and out of bed. Elevation of B LE  Anticipate discharge on Monday with portable Prevena pumps.  Will adjust antibiotics based on culture sensitivities.  Discussed with the patient importance of elevating his feet above his heart when he is not ambulating.

## 2023-12-19 NOTE — Plan of Care (Signed)

## 2023-12-20 LAB — AEROBIC/ANAEROBIC CULTURE W GRAM STAIN (SURGICAL/DEEP WOUND)

## 2023-12-20 MED ORDER — CIPROFLOXACIN HCL 500 MG PO TABS: 500.0000 mg | ORAL_TABLET | Freq: Two times a day (BID) | ORAL | 0 refills | Status: AC

## 2023-12-20 MED ORDER — INFLUENZA VAC SPLIT HIGH-DOSE 0.5 ML IM SUSY
0.5000 mL | PREFILLED_SYRINGE | Freq: Once | INTRAMUSCULAR | Status: AC
Start: 2023-12-20 — End: 2023-12-20
  Administered 2023-12-20: 0.5 mL via INTRAMUSCULAR
  Filled 2023-12-20: qty 0.5

## 2023-12-20 NOTE — TOC Transition Note (Signed)
 Transition of Care [facility]) - Discharge Note   Patient Details  Name: Logan French MRN: [id number] Date of Birth: [birth year]  [facility]) CM/SW Contact:  Rosalva Jon Bloch, RN Phone Number: 12/20/2023, 10:34 AM   Clinical Narrative:    Patient will DC to: home Anticipated DC date: 12/20/2023 Family notified: yes Transport by: car   Per MD patient ready for DC today. RN, patient, and patient's wife notified of DC. Wife requesting home health PT, pt agreeable. States pt unable to get to outpatient therapy center. Pt without provider preference for home health services. Choice list provided. Referral made with [facility] and accepted pending MD's order.  DME referral made with [facility] for hospital bed. Equipment to deliver to pt's home today prior to discharge. Pt without RX med concerns. Wife to provide transportation to home. Post hospital f/u noted on AVS.  RNCM will sign off for now as intervention is no longer needed. Please consult us  again if new needs arise.    Final next level of care: Home/Self Care Barriers to Discharge: No Barriers Identified   Patient Goals and CMS Choice     Choice offered to / list presented to : Patient      Discharge Placement                       Discharge Plan and Services Additional resources added to the After Visit Summary for                  DME Arranged: Hospital bed   Date DME Agency Contacted: 12/20/23 Time DME Agency Contacted: 1034 Representative spoke with at DME Agency: Darlyn            Social Drivers of Health (SDOH) Interventions SDOH Screenings   Food Insecurity: No Food Insecurity (12/15/2023)  Housing: Low Risk  (12/15/2023)  Transportation Needs: No Transportation Needs (12/15/2023)  Utilities: Not At Risk (12/15/2023)  Social Connections: Socially Integrated (12/15/2023)  Tobacco Use: Low Risk  (12/14/2023)     Readmission Risk Interventions     No data to  display

## 2023-12-20 NOTE — Discharge Summary (Signed)
 Physician Discharge Summary  Patient ID: Logan French MRN: [id number] DOB/AGE: 87/22/1936 87 y.o.  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 male who was admitted 12/15/2023 with a chief complaint of No chief complaint on file. 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  They were brought to the operating room on 12/15/2023 and underwent the above named procedures.    They were given perioperative antibiotics:  Anti-infectives (From admission, onward)    Start     Dose/Rate Route Frequency Ordered Stop   12/20/23 0000  ciprofloxacin (CIPRO) 500 MG tablet        500 mg Oral 2 times daily 12/20/23 0803 01/03/24 2359   12/18/23 1600  piperacillin -tazobactam (ZOSYN ) IVPB 3.375 g        3.375 g 12.5 mL/hr over 240 Minutes Intravenous Every 8 hours 12/18/23 1510     12/15/23 1430  ceFAZolin (ANCEF) IVPB 2g/100 mL premix        2 g 200 mL/hr over 30 Minutes Intravenous Every 6 hours 12/15/23 1131 12/15/23 2114   12/15/23 1330  linezolid  (ZYVOX ) IVPB 600 mg        600 mg 300 mL/hr over 60 Minutes Intravenous Every 12 hours 12/15/23 1244     12/15/23 1330  Ampicillin-Sulbactam (UNASYN) 3 g in sodium chloride  0.9 % 100 mL IVPB  Status:  Discontinued        3 g 200 mL/hr over 30 Minutes  Intravenous Every 6 hours 12/15/23 1244 12/18/23 1510   12/15/23 0645  ceFAZolin (ANCEF) IVPB 2g/100 mL premix        2 g 200 mL/hr over 30 Minutes Intravenous On call to O.R. 12/15/23 9362 12/15/23 0904     .  They were given compression stockings, early ambulation, and chemoprophylaxis for DVT prophylaxis.  They benefited maximally from their hospital stay and there were no complications.    Recent vital signs:  Vitals:   12/20/23 0519 12/20/23 0727  BP: (!) 144/91 (!) 147/85  Pulse:  98  Resp: 18 18  Temp: (!) 97.5 F (36.4 C) 98.2 F (36.8 C)  SpO2: 100% 92%    Recent laboratory studies:  Results for orders placed or performed during the hospital encounter of 12/15/23  CBC WITH DIFFERENTIAL   Collection Time: 12/15/23  7:28 AM  Result Value Ref Range   WBC 6.7 4.0 - 10.5 K/uL   RBC 5.14 4.22 - 5.81 MIL/uL   Hemoglobin 11.8 (L) 13.0 - 17.0 g/dL   HCT 60.0 60.9 - 47.9 %   MCV 77.6 (L) 80.0 - 100.0 fL   MCH 23.0 (L) 26.0 - 34.0 pg   MCHC 29.6 (L) 30.0 - 36.0 g/dL  RDW 21.0 (H) 11.5 - 15.5 %   Platelets 267 150 - 400 K/uL   nRBC 0.0 0.0 - 0.2 %   Neutrophils Relative % 59 %   Neutro Abs 3.9 1.7 - 7.7 K/uL   Lymphocytes Relative 24 %   Lymphs Abs 1.6 0.7 - 4.0 K/uL   Monocytes Relative 12 %   Monocytes Absolute 0.8 0.1 - 1.0 K/uL   Eosinophils Relative 4 %   Eosinophils Absolute 0.3 0.0 - 0.5 K/uL   Basophils Relative 1 %   Basophils Absolute 0.1 0.0 - 0.1 K/uL   Immature Granulocytes 0 %   Abs Immature Granulocytes 0.03 0.00 - 0.07 K/uL  Comprehensive metabolic panel   Collection Time: 12/15/23  7:28 AM  Result Value Ref Range   Sodium 137 135 - 145 mmol/L   Potassium 4.3 3.5 - 5.1 mmol/L   Chloride 102 98 - 111 mmol/L   CO2 24 22 - 32 mmol/L   Glucose, Bld 101 (H) 70 - 99 mg/dL   BUN 17 8 - 23 mg/dL   Creatinine, Ser 8.91 0.61 - 1.24 mg/dL   Calcium  8.7 (L) 8.9 - 10.3 mg/dL   Total Protein 5.7 (L) 6.5 - 8.1 g/dL   Albumin 2.7 (L) 3.5 - 5.0 g/dL   AST  40 15 - 41 U/L   ALT 32 0 - 44 U/L   Alkaline Phosphatase 90 38 - 126 U/L   Total Bilirubin 1.0 0.0 - 1.2 mg/dL   GFR, Estimated >39 >39 mL/min   Anion gap 11 5 - 15  Aerobic/Anaerobic Culture w Gram Stain (surgical/deep wound)   Collection Time: 12/15/23  8:38 AM   Specimen: Soft Tissue, Other  Result Value Ref Range   Specimen Description TISSUE    Special Requests RIGHT LEG WOUND    Gram Stain      FEW WBC PRESENT, PREDOMINANTLY PMN ABUNDANT GRAM NEGATIVE RODS Performed at [facility], [address], [place], [place] [address]    Culture      MODERATE KLEBSIELLA OXYTOCA MODERATE PROVIDENCIA RETTGERI FEW STAPHYLOCOCCUS AUREUS FEW PROTEUS MIRABILIS MODERATE PSEUDOMONAS AERUGINOSA SUSCEPTIBILITIES PERFORMED ON PREVIOUS CULTURE WITHIN THE LAST 5 DAYS. FOR KLEBSIELLA OXYTOCA PROTEUS MIRABILIS AND PSEUDOMONAS AERUGINOSA NO ANAEROBES ISOLATED; CULTURE IN PROGRESS FOR 5 DAYS    Report Status PENDING    Organism ID, Bacteria PROVIDENCIA RETTGERI    Organism ID, Bacteria STAPHYLOCOCCUS AUREUS       Susceptibility   Providencia rettgeri - MIC*    AMPICILLIN RESISTANT Resistant     CEFEPIME  <=0.12 SENSITIVE Sensitive     ERTAPENEM <=0.12 SENSITIVE Sensitive     CEFTRIAXONE  <=0.25 SENSITIVE Sensitive     CIPROFLOXACIN <=0.06 SENSITIVE Sensitive     GENTAMICIN <=1 SENSITIVE Sensitive     MEROPENEM 1 SENSITIVE Sensitive     TRIMETH/SULFA <=20 SENSITIVE Sensitive     AMPICILLIN/SULBACTAM >=32 RESISTANT Resistant     PIP/TAZO Value in next row Sensitive      <=4 SENSITIVEThis is a modified FDA-approved test that has been validated and its performance characteristics determined by the reporting laboratory.  This laboratory is certified under the Clinical Laboratory Improvement Amendments CLIA as qualified to perform high complexity clinical laboratory testing.    * MODERATE PROVIDENCIA RETTGERI   Staphylococcus aureus - MIC*    CIPROFLOXACIN Value in next row Sensitive      <=4  SENSITIVEThis is a modified FDA-approved test that has been validated and its performance characteristics determined by the reporting  laboratory.  This laboratory is certified under the Clinical Laboratory Improvement Amendments CLIA as qualified to perform high complexity clinical laboratory testing.    ERYTHROMYCIN Value in next row Sensitive      <=4 SENSITIVEThis is a modified FDA-approved test that has been validated and its performance characteristics determined by the reporting laboratory.  This laboratory is certified under the Clinical Laboratory Improvement Amendments CLIA as qualified to perform high complexity clinical laboratory testing.    GENTAMICIN Value in next row Sensitive      <=4 SENSITIVEThis is a modified FDA-approved test that has been validated and its performance characteristics determined by the reporting laboratory.  This laboratory is certified under the Clinical Laboratory Improvement Amendments CLIA as qualified to perform high complexity clinical laboratory testing.    OXACILLIN Value in next row Sensitive      <=4 SENSITIVEThis is a modified FDA-approved test that has been validated and its performance characteristics determined by the reporting laboratory.  This laboratory is certified under the Clinical Laboratory Improvement Amendments CLIA as qualified to perform high complexity clinical laboratory testing.    TETRACYCLINE Value in next row Resistant      <=4 SENSITIVEThis is a modified FDA-approved test that has been validated and its performance characteristics determined by the reporting laboratory.  This laboratory is certified under the Clinical Laboratory Improvement Amendments CLIA as qualified to perform high complexity clinical laboratory testing.    VANCOMYCIN  Value in next row Sensitive      <=4 SENSITIVEThis is a modified FDA-approved test that has been validated and its performance characteristics determined by the reporting laboratory.  This laboratory is  certified under the Clinical Laboratory Improvement Amendments CLIA as qualified to perform high complexity clinical laboratory testing.    TRIMETH/SULFA Value in next row Sensitive      <=4 SENSITIVEThis is a modified FDA-approved test that has been validated and its performance characteristics determined by the reporting laboratory.  This laboratory is certified under the Clinical Laboratory Improvement Amendments CLIA as qualified to perform high complexity clinical laboratory testing.    CLINDAMYCIN Value in next row Sensitive      <=4 SENSITIVEThis is a modified FDA-approved test that has been validated and its performance characteristics determined by the reporting laboratory.  This laboratory is certified under the Clinical Laboratory Improvement Amendments CLIA as qualified to perform high complexity clinical laboratory testing.    RIFAMPIN Value in next row Sensitive      <=4 SENSITIVEThis is a modified FDA-approved test that has been validated and its performance characteristics determined by the reporting laboratory.  This laboratory is certified under the Clinical Laboratory Improvement Amendments CLIA as qualified to perform high complexity clinical laboratory testing.    Inducible Clindamycin Value in next row Sensitive      <=4 SENSITIVEThis is a modified FDA-approved test that has been validated and its performance characteristics determined by the reporting laboratory.  This laboratory is certified under the Clinical Laboratory Improvement Amendments CLIA as qualified to perform high complexity clinical laboratory testing.    LINEZOLID  Value in next row Sensitive      <=4 SENSITIVEThis is a modified FDA-approved test that has been validated and its performance characteristics determined by the reporting laboratory.  This laboratory is certified under the Clinical Laboratory Improvement Amendments CLIA as qualified to perform high complexity clinical laboratory testing.    * FEW  STAPHYLOCOCCUS AUREUS  Aerobic/Anaerobic Culture w Gram Stain (surgical/deep wound)   Collection Time: 12/15/23  8:40 AM  Specimen: Soft Tissue, Other  Result Value Ref Range   Specimen Description TISSUE    Special Requests LEFT LEG WD    Gram Stain      RARE WBC PRESENT, PREDOMINANTLY PMN ABUNDANT GRAM POSITIVE COCCI RARE GRAM NEGATIVE RODS Performed at [facility], [address] [place], [place] [address]    Culture      MODERATE PROTEUS MIRABILIS MODERATE PROVIDENCIA STUARTII FEW KLEBSIELLA OXYTOCA FEW PSEUDOMONAS AERUGINOSA MODERATE STAPHYLOCOCCUS AUREUS SUSCEPTIBILITIES PERFORMED ON PREVIOUS CULTURE WITHIN THE LAST 5 DAYS. NO ANAEROBES ISOLATED; CULTURE IN PROGRESS FOR 5 DAYS    Report Status PENDING    Organism ID, Bacteria PROTEUS MIRABILIS    Organism ID, Bacteria KLEBSIELLA OXYTOCA    Organism ID, Bacteria PSEUDOMONAS AERUGINOSA    Organism ID, Bacteria PROVIDENCIA STUARTII       Susceptibility   Klebsiella oxytoca - MIC*    AMPICILLIN RESISTANT Resistant     CEFEPIME  <=0.12 SENSITIVE Sensitive     ERTAPENEM <=0.12 SENSITIVE Sensitive     CEFTRIAXONE  <=0.25 SENSITIVE Sensitive     CIPROFLOXACIN <=0.06 SENSITIVE Sensitive     GENTAMICIN <=1 SENSITIVE Sensitive     MEROPENEM <=0.25 SENSITIVE Sensitive     TRIMETH/SULFA <=20 SENSITIVE Sensitive     AMPICILLIN/SULBACTAM 4 SENSITIVE Sensitive     PIP/TAZO Value in next row Sensitive      <=4 SENSITIVEThis is a modified FDA-approved test that has been validated and its performance characteristics determined by the reporting laboratory.  This laboratory is certified under the Clinical Laboratory Improvement Amendments CLIA as qualified to perform high complexity clinical laboratory testing.    * FEW KLEBSIELLA OXYTOCA   Pseudomonas aeruginosa - MIC*    MEROPENEM Value in next row Intermediate      <=4 SENSITIVEThis is a modified FDA-approved test that has been validated and its performance characteristics  determined by the reporting laboratory.  This laboratory is certified under the Clinical Laboratory Improvement Amendments CLIA as qualified to perform high complexity clinical laboratory testing.    CIPROFLOXACIN Value in next row Sensitive      <=4 SENSITIVEThis is a modified FDA-approved test that has been validated and its performance characteristics determined by the reporting laboratory.  This laboratory is certified under the Clinical Laboratory Improvement Amendments CLIA as qualified to perform high complexity clinical laboratory testing.    IMIPENEM Value in next row Sensitive      <=4 SENSITIVEThis is a modified FDA-approved test that has been validated and its performance characteristics determined by the reporting laboratory.  This laboratory is certified under the Clinical Laboratory Improvement Amendments CLIA as qualified to perform high complexity clinical laboratory testing.    PIP/TAZO Value in next row Sensitive      8 SENSITIVEThis is a modified FDA-approved test that has been validated and its performance characteristics determined by the reporting laboratory.  This laboratory is certified under the Clinical Laboratory Improvement Amendments CLIA as qualified to perform high complexity clinical laboratory testing.    CEFTAZIDIME/AVIBACTAM Value in next row Sensitive      8 SENSITIVEThis is a modified FDA-approved test that has been validated and its performance characteristics determined by the reporting laboratory.  This laboratory is certified under the Clinical Laboratory Improvement Amendments CLIA as qualified to perform high complexity clinical laboratory testing.    CEFTOLOZANE/TAZOBACTAM Value in next row Sensitive      8 SENSITIVEThis is a modified FDA-approved test that has been validated and its performance characteristics determined by the reporting  laboratory.  This laboratory is certified under the Clinical Laboratory Improvement Amendments CLIA as qualified to perform  high complexity clinical laboratory testing.    TOBRAMYCIN Value in next row Sensitive      8 SENSITIVEThis is a modified FDA-approved test that has been validated and its performance characteristics determined by the reporting laboratory.  This laboratory is certified under the Clinical Laboratory Improvement Amendments CLIA as qualified to perform high complexity clinical laboratory testing.    CEFTAZIDIME Value in next row Sensitive      8 SENSITIVEThis is a modified FDA-approved test that has been validated and its performance characteristics determined by the reporting laboratory.  This laboratory is certified under the Clinical Laboratory Improvement Amendments CLIA as qualified to perform high complexity clinical laboratory testing.    * FEW PSEUDOMONAS AERUGINOSA   Proteus mirabilis - MIC*    AMPICILLIN Value in next row Sensitive      8 SENSITIVEThis is a modified FDA-approved test that has been validated and its performance characteristics determined by the reporting laboratory.  This laboratory is certified under the Clinical Laboratory Improvement Amendments CLIA as qualified to perform high complexity clinical laboratory testing.    CEFAZOLIN (NON-URINE) Value in next row Intermediate      8 SENSITIVEThis is a modified FDA-approved test that has been validated and its performance characteristics determined by the reporting laboratory.  This laboratory is certified under the Clinical Laboratory Improvement Amendments CLIA as qualified to perform high complexity clinical laboratory testing.    CEFEPIME  Value in next row Sensitive      8 SENSITIVEThis is a modified FDA-approved test that has been validated and its performance characteristics determined by the reporting laboratory.  This laboratory is certified under the Clinical Laboratory Improvement Amendments CLIA as qualified to perform high complexity clinical laboratory testing.    ERTAPENEM Value in next row Sensitive      8  SENSITIVEThis is a modified FDA-approved test that has been validated and its performance characteristics determined by the reporting laboratory.  This laboratory is certified under the Clinical Laboratory Improvement Amendments CLIA as qualified to perform high complexity clinical laboratory testing.    CEFTRIAXONE  Value in next row Sensitive      8 SENSITIVEThis is a modified FDA-approved test that has been validated and its performance characteristics determined by the reporting laboratory.  This laboratory is certified under the Clinical Laboratory Improvement Amendments CLIA as qualified to perform high complexity clinical laboratory testing.    CIPROFLOXACIN Value in next row Sensitive      8 SENSITIVEThis is a modified FDA-approved test that has been validated and its performance characteristics determined by the reporting laboratory.  This laboratory is certified under the Clinical Laboratory Improvement Amendments CLIA as qualified to perform high complexity clinical laboratory testing.    GENTAMICIN Value in next row Sensitive      8 SENSITIVEThis is a modified FDA-approved test that has been validated and its performance characteristics determined by the reporting laboratory.  This laboratory is certified under the Clinical Laboratory Improvement Amendments CLIA as qualified to perform high complexity clinical laboratory testing.    MEROPENEM Value in next row Sensitive      8 SENSITIVEThis is a modified FDA-approved test that has been validated and its performance characteristics determined by the reporting laboratory.  This laboratory is certified under the Clinical Laboratory Improvement Amendments CLIA as qualified to perform high complexity clinical laboratory testing.    TRIMETH/SULFA Value in next row Sensitive  8 SENSITIVEThis is a modified FDA-approved test that has been validated and its performance characteristics determined by the reporting laboratory.  This laboratory is  certified under the Clinical Laboratory Improvement Amendments CLIA as qualified to perform high complexity clinical laboratory testing.    AMPICILLIN/SULBACTAM Value in next row Sensitive      8 SENSITIVEThis is a modified FDA-approved test that has been validated and its performance characteristics determined by the reporting laboratory.  This laboratory is certified under the Clinical Laboratory Improvement Amendments CLIA as qualified to perform high complexity clinical laboratory testing.    PIP/TAZO Value in next row Sensitive      <=4 SENSITIVEThis is a modified FDA-approved test that has been validated and its performance characteristics determined by the reporting laboratory.  This laboratory is certified under the Clinical Laboratory Improvement Amendments CLIA as qualified to perform high complexity clinical laboratory testing.    * MODERATE PROTEUS MIRABILIS   Providencia stuartii - MIC*    AMPICILLIN Value in next row Resistant      <=4 SENSITIVEThis is a modified FDA-approved test that has been validated and its performance characteristics determined by the reporting laboratory.  This laboratory is certified under the Clinical Laboratory Improvement Amendments CLIA as qualified to perform high complexity clinical laboratory testing.    CEFEPIME  Value in next row Sensitive      <=4 SENSITIVEThis is a modified FDA-approved test that has been validated and its performance characteristics determined by the reporting laboratory.  This laboratory is certified under the Clinical Laboratory Improvement Amendments CLIA as qualified to perform high complexity clinical laboratory testing.    ERTAPENEM Value in next row Sensitive      <=4 SENSITIVEThis is a modified FDA-approved test that has been validated and its performance characteristics determined by the reporting laboratory.  This laboratory is certified under the Clinical Laboratory Improvement Amendments CLIA as qualified to perform high  complexity clinical laboratory testing.    CEFTRIAXONE  Value in next row Sensitive      <=4 SENSITIVEThis is a modified FDA-approved test that has been validated and its performance characteristics determined by the reporting laboratory.  This laboratory is certified under the Clinical Laboratory Improvement Amendments CLIA as qualified to perform high complexity clinical laboratory testing.    CIPROFLOXACIN Value in next row Sensitive      <=4 SENSITIVEThis is a modified FDA-approved test that has been validated and its performance characteristics determined by the reporting laboratory.  This laboratory is certified under the Clinical Laboratory Improvement Amendments CLIA as qualified to perform high complexity clinical laboratory testing.    GENTAMICIN Value in next row Resistant      <=4 SENSITIVEThis is a modified FDA-approved test that has been validated and its performance characteristics determined by the reporting laboratory.  This laboratory is certified under the Clinical Laboratory Improvement Amendments CLIA as qualified to perform high complexity clinical laboratory testing.    MEROPENEM Value in next row Sensitive      <=4 SENSITIVEThis is a modified FDA-approved test that has been validated and its performance characteristics determined by the reporting laboratory.  This laboratory is certified under the Clinical Laboratory Improvement Amendments CLIA as qualified to perform high complexity clinical laboratory testing.    TRIMETH/SULFA Value in next row Sensitive      <=4 SENSITIVEThis is a modified FDA-approved test that has been validated and its performance characteristics determined by the reporting laboratory.  This laboratory is certified under the Clinical Laboratory Improvement Amendments CLIA as qualified to  perform high complexity clinical laboratory testing.    AMPICILLIN/SULBACTAM Value in next row Resistant      <=4 SENSITIVEThis is a modified FDA-approved test that has been  validated and its performance characteristics determined by the reporting laboratory.  This laboratory is certified under the Clinical Laboratory Improvement Amendments CLIA as qualified to perform high complexity clinical laboratory testing.    PIP/TAZO Value in next row Sensitive      <=4 SENSITIVEThis is a modified FDA-approved test that has been validated and its performance characteristics determined by the reporting laboratory.  This laboratory is certified under the Clinical Laboratory Improvement Amendments CLIA as qualified to perform high complexity clinical laboratory testing.    * MODERATE PROVIDENCIA STUARTII  Glucose, capillary   Collection Time: 12/15/23  9:20 AM  Result Value Ref Range   Glucose-Capillary 97 70 - 99 mg/dL  Hemoglobin and hematocrit, blood   Collection Time: 12/18/23  9:26 AM  Result Value Ref Range   Hemoglobin 11.1 (L) 13.0 - 17.0 g/dL   HCT 63.1 (L) 60.9 - 47.9 %    Discharge Medications:   Allergies as of 12/20/2023   No Known Allergies      Medication List     STOP taking these medications    collagenase  250 UNIT/GM ointment Commonly known as: SANTYL    diclofenac  Sodium 1 % Gel Commonly known as: VOLTAREN    triamcinolone  0.1 % cream : eucerin Crea       TAKE these medications    acetaminophen  500 MG tablet Commonly known as: TYLENOL  Take 1,000 mg by mouth every 6 (six) hours as needed for moderate pain (pain score 4-6).   ciprofloxacin 500 MG tablet Commonly known as: Cipro Take 1 tablet (500 mg total) by mouth 2 (two) times daily for 28 doses.   clopidogrel  75 MG tablet Commonly known as: PLAVIX  Take 1 tablet (75 mg total) by mouth daily.   colchicine  0.6 MG tablet Take 1 tablet (0.6 mg total) by mouth daily.   cyanocobalamin  1000 MCG tablet Commonly known as: VITAMIN B12 Take 0.5 tablets (500 mcg total) by mouth daily.   ezetimibe  10 MG tablet Commonly known as: ZETIA  Take 1 tablet (10 mg total) by mouth daily.    ferrous sulfate 325 (65 FE) MG EC tablet Take 325 mg by mouth 3 (three) times a week.   finasteride  5 MG tablet Commonly known as: PROSCAR  Take 1 tablet (5 mg total) by mouth daily.   furosemide  20 MG tablet Commonly known as: LASIX  Take 20 mg by mouth daily as needed for fluid or edema.   hydrochlorothiazide  12.5 MG capsule Commonly known as: MICROZIDE  Take 1 capsule (12.5 mg total) by mouth daily.   LIBREXIA-AF apixaban  or placebo 5 mg Caps capsule Take 1 capsule (5 mg total) by mouth 2 (two) times daily. Take at approximately the same time of day with or without food. Please bring bottle back with you to every visit; do not discard bottle. For Investigational Use Only.   LIBREXIA-AF [id number] (Milvexian) or placebo 100 mg tablet Take 1 tablet by mouth 2 (two) times daily. Take at approximately the same time of day with or without food. Please bring bottle back with you to every visit; do not discard bottle. For Investigational Use Only.   metFORMIN  500 MG tablet Commonly known as: GLUCOPHAGE  Take 1 tablet (500 mg total) by mouth daily with breakfast.   metoprolol  succinate 25 MG 24 hr tablet Commonly known as: TOPROL -XL Take 0.5 tablets (12.5 mg  total) by mouth every 12 (twelve) hours.   oxyCODONE  5 MG immediate release tablet Commonly known as: Oxy IR/ROXICODONE  Take 1 tablet (5 mg total) by mouth every 6 (six) hours as needed for severe pain (pain score 7-10).   pantoprazole  40 MG tablet Commonly known as: PROTONIX  Take 1 tablet (40 mg total) by mouth daily.   potassium chloride  SA 20 MEQ tablet Commonly known as: KLOR-CON  M Take 20 mEq by mouth daily as needed (Fluid).   rosuvastatin  20 MG tablet Commonly known as: CRESTOR  Take 1 tablet (20 mg total) by mouth daily.   vitamin D3 25 MCG tablet Commonly known as: CHOLECALCIFEROL  Take 1 tablet (1,000 Units total) by mouth daily.               Durable Medical Equipment  (From admission, onward)            Start     Ordered   12/16/23 0823  For home use only DME Hospital bed  Once       Question Answer Comment  Length of Need 6 Months   Patient has (list medical condition): non healing ulcers B LE, needs to elevate his legs   The above medical condition requires: Patient requires the ability to reposition frequently   Head must be elevated greater than: Other see comments   Bed type Semi-electric      12/16/23 0825            Diagnostic Studies: No results found.  Disposition: Discharge disposition: 01-Home or Self Care       Discharge Instructions     Call MD / Call 911   Complete by: As directed    If you experience chest pain or shortness of breath, CALL 911 and be transported to the hospital emergency room.  If you develope a fever above 101 F, pus (white drainage) or increased drainage or redness at the wound, or calf pain, call your surgeon's office.   Constipation Prevention   Complete by: As directed    Drink plenty of fluids.  Prune juice may be helpful.  You may use a stool softener, such as Colace (over the counter) 100 mg twice a day.  Use MiraLax  (over the counter) for constipation as needed.   Diet - low sodium heart healthy   Complete by: As directed    Increase activity slowly as tolerated   Complete by: As directed    Negative Pressure Wound Therapy - Incisional   Complete by: As directed    Attach each leg wound VAC dressing to a separate Prevena plus portable wound VAC pump.  Show patient how to plug these and to keep them charged.   Post-operative opioid taper instructions:   Complete by: As directed    POST-OPERATIVE OPIOID TAPER INSTRUCTIONS: It is important to wean off of your opioid medication as soon as possible. If you do not need pain medication after your surgery it is ok to stop day one. Opioids include: Codeine, Hydrocodone(Norco, Vicodin), Oxycodone (Percocet, oxycontin ) and hydromorphone amongst others.  Long term and even short  term use of opiods can cause: Increased pain response Dependence Constipation Depression Respiratory depression And more.  Withdrawal symptoms can include Flu like symptoms Nausea, vomiting And more Techniques to manage these symptoms Hydrate well Eat regular healthy meals Stay active Use relaxation techniques(deep breathing, meditating, yoga) Do Not substitute Alcohol  to help with tapering If you have been on opioids for less than two weeks and do not have pain  than it is ok to stop all together.  Plan to wean off of opioids This plan should start within one week post op of your joint replacement. Maintain the same interval or time between taking each dose and first decrease the dose.  Cut the total daily intake of opioids by one tablet each day Next start to increase the time between doses. The last dose that should be eliminated is the evening dose.           Follow-up Information     Harden Jerona GAILS, MD Follow up in 1 week(s).   Specialty: Orthopedic Surgery Contact information: [address] [place]  [place] [address] [phone]                  Signed: Jerona GAILS Harden 12/20/2023, 8:03 AM

## 2023-12-20 NOTE — Progress Notes (Signed)
 Patient ID: Logan French, male   DOB: 1935/02/11, 89 y.o.   MRN: [id number] Patient is status post debridement multiple venous stasis insufficiency ulcers bilateral lower extremities.  The cultures are positive for 4 different bacteria.  All cultures are sensitive to Cipro.  Will plan for discharge to home on Cipro for 2 weeks.  Discussed the importance of using a probiotic daily to minimize risk of C. difficile.  Patient states that a hospital bed has been ordered.

## 2023-12-20 NOTE — Progress Notes (Signed)
 Occupational Therapy Treatment Patient Details Name: Logan French MRN: [id number] DOB: [birth year] Today's Date: 12/20/2023   History of present illness Logan French is a 89 y.o. male who presented to [facility] 12/15/23 with a diagnosis of bilateral leg venous ulcers who failed conservative measures and elected for surgical management. Pt s/p excision of deep abscess with wound vac application. PMHx:  prostate cancer, anemia, HTN, HLD, a fib, b12 deficiency, PAD s/p vascular intervention.   OT comments  Pt reports loose stools over weekend, appears to have resolved. Pt with questions about how to manage Prevena wound vacs, educated. Hospital bed has been ordered for home to assist in helping pt keep LEs elevated. Pt plans to continue to keep private pay nurse to help with wounds and compression garments after he sees MD for follow up visit. Asking about how to progress walking program, PT made aware. Pt reports stiffness in ankles which improves with mobility.       If plan is discharge home, recommend the following:  A little help with walking and/or transfers;A little help with bathing/dressing/bathroom;Assistance with cooking/housework;Assist for transportation;Help with stairs or ramp for entrance   Equipment Recommendations  None recommended by OT    Recommendations for Other Services      Precautions / Restrictions Precautions Precautions: Fall Recall of Precautions/Restrictions: Intact Precaution/Restrictions Comments: Wound Vac BLE Required Braces or Orthoses: Other Brace Other Brace: B post-op shoes Restrictions Weight Bearing Restrictions Per Provider Order: Yes RLE Weight Bearing Per Provider Order: Weight bearing as tolerated LLE Weight Bearing Per Provider Order: Weight bearing as tolerated Other Position/Activity Restrictions: Elevate the legs when at rest to decrease edema.       Mobility Bed Mobility Overal bed mobility: Needs Assistance Bed Mobility: Supine  to Sit, Sit to Supine     Supine to sit: Supervision Sit to supine: Supervision   General bed mobility comments: hob elevated for in/out of bed, pt. with not physical assistance for management of BLEs    Transfers Overall transfer level: Needs assistance Equipment used: Rolling walker (2 wheels) Transfers: Sit to/from Stand, Bed to chair/wheelchair/BSC Sit to Stand: Supervision Stand pivot transfers: Supervision               Balance   Sitting-balance support: No upper extremity supported, Feet supported Sitting balance-Leahy Scale: Good       Standing balance-Leahy Scale: Poor Standing balance comment: Pt dependent on RW                           ADL either performed or assessed with clinical judgement   ADL Overall ADL's : Needs assistance/impaired     Grooming: Wash/dry hands;Set up;Sitting               Lower Body Dressing: Sitting/lateral leans;Minimal assistance   Toilet Transfer: Supervision/safety;Rolling walker (2 wheels);BSC/3in1;Stand-pivot   Toileting- Clothing Manipulation and Hygiene: Set up;Sitting/lateral lean              Extremity/Trunk Assessment              Vision       Perception     Praxis     Communication Communication Communication: No apparent difficulties   Cognition Arousal: Alert Behavior During Therapy: WFL for tasks assessed/performed Cognition: No apparent impairments  Cueing   Cueing Techniques: Verbal cues  Exercises      Shoulder Instructions       General Comments      Pertinent Vitals/ Pain       Pain Assessment Pain Assessment: Faces Faces Pain Scale: Hurts a little bit Pain Location: ankles Pain Descriptors / Indicators: Discomfort, Tightness Pain Intervention(s): Monitored during session, Repositioned, Premedicated before session  Home Living                                          Prior  Functioning/Environment              Frequency  Min 2X/week        Progress Toward Goals  OT Goals(current goals can now be found in the care plan section)  Progress towards OT goals: Progressing toward goals  Acute Rehab OT Goals OT Goal Formulation: With patient Time For Goal Achievement: 12/30/23 Potential to Achieve Goals: Good  Plan      Co-evaluation                 AM-PAC OT 6 Clicks Daily Activity     Outcome Measure   Help from another person eating meals?: None Help from another person taking care of personal grooming?: None Help from another person toileting, which includes using toliet, bedpan, or urinal?: A Little Help from another person bathing (including washing, rinsing, drying)?: A Little Help from another person to put on and taking off regular upper body clothing?: None Help from another person to put on and taking off regular lower body clothing?: A Little 6 Click Score: 21    End of Session Equipment Utilized During Treatment: Rolling walker (2 wheels)  OT Visit Diagnosis: Unsteadiness on feet (R26.81);Other abnormalities of gait and mobility (R26.89);Pain;Muscle weakness (generalized) (M62.81)   Activity Tolerance Patient tolerated treatment well   Patient Left in bed;with call bell/phone within reach;with bed alarm set   Nurse Communication          Time: 9164-9082 OT Time Calculation (min): 42 min  Charges: OT General Charges $OT Visit: 1 Visit OT Treatments $Self Care/Home Management : 23-37 mins  Mliss HERO, OTR/L Acute Rehabilitation Services Office: [phone]   Kennth Mliss Helling 12/20/2023, 9:57 AM

## 2023-12-20 NOTE — Progress Notes (Signed)
    Durable Medical Equipment  (From admission, onward)           Start     Ordered   12/16/23 0823  For home use only DME Hospital bed  Once       Question Answer Comment  Length of Need 6 Months   Patient has (list medical condition): non healing ulcers B LE, needs to elevate his legs   The above medical condition requires: Patient requires the ability to reposition frequently   Head must be elevated greater than: Other see comments   Bed type Semi-electric      12/16/23 0825

## 2023-12-20 NOTE — Progress Notes (Signed)
 Physical Therapy Treatment  Patient Details Name: Logan French MRN: [id number] DOB: Feb 10, 1935 Today's Date: 12/20/2023   History of Present Illness Logan French is a 89 y.o. male who presented to [facility] 12/15/23 with a diagnosis of bilateral leg venous ulcers who failed conservative measures and elected for surgical management. Pt s/p excision of deep abscess with wound vac application. PMHx:  prostate cancer, anemia, HTN, HLD, a fib, b12 deficiency, PAD s/p vascular intervention.    PT Comments  Pt progressing towards physical therapy goals and anticipates d/c home today. Pt was motivated for ambulation distance, however complained of UE fatigue from walker use. Overall pt moving slow but steady with RW use. Discussed recommendations for appropriate activity progression at home. Will continue to follow and progress as able per POC.     If plan is discharge home, recommend the following: A little help with walking and/or transfers;A little help with bathing/dressing/bathroom;Assistance with cooking/housework;Assist for transportation;Help with stairs or ramp for entrance   Can travel by private vehicle        Equipment Recommendations  None recommended by PT    Recommendations for Other Services       Precautions / Restrictions Precautions Precautions: Fall Recall of Precautions/Restrictions: Intact Precaution/Restrictions Comments: Wound Vac BLE Required Braces or Orthoses: Other Brace Other Brace: B post-op shoes Restrictions Weight Bearing Restrictions Per Provider Order: Yes RLE Weight Bearing Per Provider Order: Weight bearing as tolerated LLE Weight Bearing Per Provider Order: Weight bearing as tolerated Other Position/Activity Restrictions: Elevate the legs when at rest to decrease edema.     Mobility  Bed Mobility Overal bed mobility: Modified Independent Bed Mobility: Supine to Sit           General bed mobility comments: Increased time and effort but pt  was able to transition to EOB without assistance. HOB elevated.    Transfers Overall transfer level: Needs assistance Equipment used: Rolling walker (2 wheels) Transfers: Sit to/from Stand Sit to Stand: Supervision           General transfer comment: Elevated bed height. Pt utilized momentum to power up to full stand. Appeared effortful but no LOB noted.    Ambulation/Gait Ambulation/Gait assistance: Contact guard assist, Supervision (IV pole & Wound Vac management) Gait Distance (Feet): 500 Feet Assistive device: Rolling walker (2 wheels) Gait Pattern/deviations: Step-through pattern, Decreased stride length, Trunk flexed, Wide base of support Gait velocity: decreased Gait velocity interpretation: <1.31 ft/sec, indicative of household ambulator   General Gait Details: Slow but generally steady with RW for support. Overall low floor clearance during swing through. Pt motivated for distance but required standing rest breaks due to triceps fatigue and pain from walker use. Encouraged improved posture and closer walker proximity to decrease triceps fatigue however pt unable to maintain corrective changes.   Stairs         General stair comments: Pt reports he feels confident with stair negotiation after last PT session.   Wheelchair Mobility     Tilt Bed    Modified Rankin (Stroke Patients Only)       Balance Overall balance assessment: Needs assistance Sitting-balance support: No upper extremity supported, Feet supported Sitting balance-Leahy Scale: Good     Standing balance support: Bilateral upper extremity supported, During functional activity, Reliant on assistive device for balance Standing balance-Leahy Scale: Poor Standing balance comment: Pt dependent on RW  Communication Communication Communication: No apparent difficulties  Cognition Arousal: Alert Behavior During Therapy: WFL for tasks assessed/performed   PT  - Cognitive impairments: No apparent impairments                       PT - Cognition Comments: Pt A,Ox4 Following commands: Intact      Cueing Cueing Techniques: Verbal cues  Exercises      General Comments        Pertinent Vitals/Pain Pain Assessment Pain Assessment: Faces Faces Pain Scale: Hurts a little bit Pain Location: pain at triceps (bilaterally) with walker use greater than pain at ankles. Pain Descriptors / Indicators: Discomfort, Tightness, Operative site guarding, Aching Pain Intervention(s): Limited activity within patient's tolerance, Monitored during session, Repositioned    Home Living                          Prior Function            PT Goals (current goals can now be found in the care plan section) Acute Rehab PT Goals Patient Stated Goal: Home today, be more independent with mobility at home. PT Goal Formulation: With patient Time For Goal Achievement: 12/29/23 Potential to Achieve Goals: Good Progress towards PT goals: Progressing toward goals    Frequency    Min 2X/week      PT Plan      Co-evaluation              AM-PAC PT 6 Clicks Mobility   Outcome Measure  Help needed turning from your back to your side while in a flat bed without using bedrails?: A Little Help needed moving from lying on your back to sitting on the side of a flat bed without using bedrails?: A Little Help needed moving to and from a bed to a chair (including a wheelchair)?: A Little Help needed standing up from a chair using your arms (e.g., wheelchair or bedside chair)?: A Little Help needed to walk in hospital room?: A Little Help needed climbing 3-5 steps with a railing? : A Little 6 Click Score: 18    End of Session Equipment Utilized During Treatment: Gait belt Activity Tolerance: Patient tolerated treatment well Patient left: in chair;with call bell/phone within reach;Other (comment) (LEs elevated on pillow) Nurse  Communication: Mobility status PT Visit Diagnosis: Other abnormalities of gait and mobility (R26.89)     Time: 1020-1108 PT Time Calculation (min) (ACUTE ONLY): 48 min  Charges:    $Gait Training: 23-37 mins $Therapeutic Activity: 8-22 mins PT General Charges $$ ACUTE PT VISIT: 1 Visit                     Leita Sable, PT, DPT Acute Rehabilitation Services Secure Chat Preferred Office: [phone]    Leita JONETTA Sable 12/20/2023, 12:23 PM

## 2023-12-23 ENCOUNTER — Telehealth: Payer: Self-pay

## 2023-12-23 ENCOUNTER — Ambulatory Visit (INDEPENDENT_AMBULATORY_CARE_PROVIDER_SITE_OTHER): Admitting: Physician Assistant

## 2023-12-23 DIAGNOSIS — I87331 Chronic venous hypertension (idiopathic) with ulcer and inflammation of right lower extremity: Secondary | ICD-10-CM

## 2023-12-23 DIAGNOSIS — I890 Lymphedema, not elsewhere classified: Secondary | ICD-10-CM

## 2023-12-23 NOTE — Telephone Encounter (Signed)
 Patient called triage. His wound vac is purring constantly and making a beeping noise. Spoke with Aut[facility] and they will see patient today at 3pm. Patient aware and okay with this.

## 2023-12-26 ENCOUNTER — Encounter: Payer: Self-pay | Admitting: Physician Assistant

## 2023-12-26 NOTE — Progress Notes (Signed)
 Office Visit Note   Patient: Logan French           Date of Birth: [birth year]           MRN: [id number] Visit Date: 12/23/2023              Requested by: Yolande Toribio MATSU, MD [address] [place],  [place] [address] PCP: Yolande Toribio MATSU, MD  Chief Complaint  Patient presents with   Right Leg - Routine Post Op, Dislocation    12/15/2023 BLE I&D   Left Leg - Routine Post Op      HPI: 89 y/o male with chronic venous ulcers B LE that have failed conservative treatment.  He is s/p excisional debridement of deep abscess bilateral lower extremities.  He was discharged with Prevena wound vacs.  His family called and stated the vacs are not keeping a seal.  He is here today for wound care and to trouble shoot the vacs.  He states he has sever stairs that lead out from the deck and he has been doing laps daily of walking.  He also states the hospital bed is not the same as in the hospital and it is very uncomfortable.  He finds himself in a V shape and his wife controls the buttons.    Assessment & Plan: Visit Diagnoses: No diagnosis found.  Plan: dynafelx compression wraps bilaterally.  He will elevate 75 % of the time.  Keep the walking to a minimum   Follow-Up Instructions: Return in about 5 days (around 12/28/2023).   Ortho Exam  Patient is alert, oriented, no adenopathy, well-dressed, normal affect, normal respiratory effort. We were unable to get the existing vas to hold a seal.  The right vac was removed and we tired to re apply a new vac.  His skin is macerated laterally.  We were unable to re apply a new vac.  He was ultimately placed in   He has a history of PAD.   He recently underwent left lower extremity angiogram with shockwave lithotripsy of the left popliteal artery on Jul 20, 2023 with Dr. Serene due to foot wounds. He also underwent shockwave lithotripsy of the right popliteal artery and distal SFA with drug-coated balloon angioplasty of the segments in March  for slow healing wounds.  ABI's on 10/22/23 demonstrated biphasic wave forms and right index of 100, left index of 0. 89.     Imaging: No results found. No images are attached to the encounter.  Labs: Lab Results  Component Value Date   HGBA1C 6.2 (H) 10/06/2023   HGBA1C 6.4 (H) 06/17/2023   CRP 26.9 (H) 06/23/2023   CRP 27.5 (H) 06/22/2023   CRP 27.6 (H) 06/21/2023   LABURIC 5.0 06/22/2023   LABURIC 5.3 06/21/2023   REPTSTATUS 12/20/2023 FINAL 12/15/2023   GRAMSTAIN  12/15/2023    RARE WBC PRESENT, PREDOMINANTLY PMN ABUNDANT GRAM POSITIVE COCCI RARE GRAM NEGATIVE RODS    CULT  12/15/2023    MODERATE PROTEUS MIRABILIS MODERATE PROVIDENCIA STUARTII FEW KLEBSIELLA OXYTOCA FEW PSEUDOMONAS AERUGINOSA MODERATE STAPHYLOCOCCUS AUREUS SUSCEPTIBILITIES PERFORMED ON PREVIOUS CULTURE WITHIN THE LAST 5 DAYS. NO ANAEROBES ISOLATED Performed at [facility], [address], [place], [place] [address]    LABORGA PROTEUS MIRABILIS 12/15/2023   LABORGA KLEBSIELLA OXYTOCA 12/15/2023   LABORGA PSEUDOMONAS AERUGINOSA 12/15/2023   LABORGA PROVIDENCIA STUARTII 12/15/2023     Lab Results  Component Value Date   ALBUMIN 2.7 (L) 12/15/2023   ALBUMIN 2.0 (  L) 08/04/2023   ALBUMIN 2.2 (L) 08/02/2023    Lab Results  Component Value Date   MG 2.0 06/23/2023   MG 2.0 06/22/2023   MG 2.0 06/21/2023   Lab Results  Component Value Date   VD25OH 27.89 (L) 06/26/2023    No results found for: PREALBUMIN    Latest Ref Rng & Units 12/18/2023    9:26 AM 12/15/2023    7:28 AM 10/27/2023    7:08 AM  CBC EXTENDED  WBC 4.0 - 10.5 K/uL  6.7    RBC 4.22 - 5.81 MIL/uL  5.14    Hemoglobin 13.0 - 17.0 g/dL 88.8  88.1  88.3   HCT 39.0 - 52.0 % 36.8  39.9  34.0   Platelets 150 - 400 K/uL  267    NEUT# 1.7 - 7.7 K/uL  3.9    Lymph# 0.7 - 4.0 K/uL  1.6       There is no height or weight on file to calculate BMI.  Orders:  No orders of the defined types were placed in this encounter.  No  orders of the defined types were placed in this encounter.    Procedures: No procedures performed  Clinical Data: No additional findings.  ROS:  All other systems negative, except as noted in the HPI. Review of Systems  Objective: Vital Signs: There were no vitals taken for this visit.  Specialty Comments:  No specialty comments available.  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 (HCC) 12/15/2023   Medication management 08/28/2023   Cellulitis 07/15/2023   Peripheral arterial disease 07/15/2023   Atrial fibrillation, chronic (HCC) 07/15/2023   Dyslipidemia 07/15/2023   BPH (benign prostatic hyperplasia) 07/15/2023   HTN (hypertension) 07/15/2023   DM (diabetes mellitus) (HCC) 07/15/2023   Bacteremia 06/25/2023   Sepsis due to undetermined organism (HCC) 06/21/2023   Cellulitis of right lower leg 06/21/2023   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) 06/20/2023   Streptococcal bacteremia 06/18/2023   Cellulitis of right lower extremity 06/17/2023   Persistent atrial fibrillation (HCC) 09/23/2021   Hypercoagulable state due to persistent atrial fibrillation (HCC) 09/23/2021   Callus 10/25/2019   Corns and callosities 10/25/2019   Hav (hallux abducto valgus), unspecified laterality 10/25/2019   Altered awareness, transient 06/23/2017   MALLET FINGER, RIGHT RING FINGER 10/19/2008   Past Medical History:  Diagnosis Date   A-fib (HCC)    B12 deficiency    Diverticulosis    Fatigue    History of colonic polyps    Hyperlipemia    Hypertension    Peripheral vascular disease    Pernicious anemia    Prostate cancer (HCC)    Syncope    Syncope     Family History  Problem Relation Age of Onset   Stroke Neg Hx    Transient ischemic attack Neg Hx     Past Surgical History:  Procedure Laterality Date   ABDOMINAL AORTOGRAM N/A 07/20/2023    Procedure: ABDOMINAL AORTOGRAM;  Surgeon: Serene Gaile ORN, MD;  Location: [facility];  Service: Cardiovascular;  Laterality: N/A;   ABDOMINAL AORTOGRAM W/LOWER EXTREMITY N/A 05/17/2023   Procedure: ABDOMINAL AORTOGRAM W/LOWER EXTREMITY;  Surgeon: Pearline Norman RAMAN, MD;  Location: [facility] INVASIVE CV LAB;  Service: Cardiovascular;  Laterality: N/A;   ABDOMINAL AORTOGRAM W/LOWER EXTREMITY N/A 10/27/2023   Procedure: ABDOMINAL AORTOGRAM W/LOWER EXTREMITY;  Surgeon: Lanis Fonda BRAVO, MD;  Location: [facility] INVASIVE CV LAB;  Service: Cardiovascular;  Laterality: N/A;   APPENDECTOMY     APPLICATION OF WOUND VAC Bilateral 12/15/2023   Procedure: APPLICATION, WOUND VAC;  Surgeon: Harden Jerona GAILS, MD;  Location: [facility] OR;  Service: Orthopedics;  Laterality: Bilateral;   CARDIOVERSION N/A 10/07/2021   Procedure: CARDIOVERSION;  Surgeon: Sheena Pugh, DO;  Location: [facility];  Service: Cardiovascular;  Laterality: N/A;   INCISION AND DRAINAGE OF DEEP ABSCESS, CALF Bilateral 12/15/2023   Procedure: BILATERAL LEG INCISION AND DRAINAGE OF DEEP ABSCESS;  Surgeon: Harden Jerona GAILS, MD;  Location: [facility] OR;  Service: Orthopedics;  Laterality: Bilateral;   IR ANGIO INTRA EXTRACRAN SEL COM CAROTID INNOMINATE BILAT MOD SED  09/10/2017   IR ANGIO VERTEBRAL SEL SUBCLAVIAN INNOMINATE BILAT MOD SED  09/10/2017   LOWER EXTREMITY ANGIOGRAPHY N/A 07/20/2023   Procedure: Lower Extremity Angiography;  Surgeon: Serene Gaile ORN, MD;  Location: [facility];  Service: Cardiovascular;  Laterality: N/A;   LOWER EXTREMITY INTERVENTION Left 07/20/2023   Procedure: LOWER EXTREMITY INTERVENTION;  Surgeon: Serene Gaile ORN, MD;  Location: [facility];  Service: Cardiovascular;  Laterality: Left;   PERIPHERAL INTRAVASCULAR LITHOTRIPSY Right 05/17/2023   Procedure: PERIPHERAL INTRAVASCULAR LITHOTRIPSY;  Surgeon: Pearline Norman RAMAN, MD;  Location: [facility] INVASIVE CV LAB;  Service: Cardiovascular;  Laterality: Right;  SFA-POP   PERIPHERAL  INTRAVASCULAR LITHOTRIPSY Left 07/20/2023   Procedure: PERIPHERAL INTRAVASCULAR LITHOTRIPSY;  Surgeon: Serene Gaile ORN, MD;  Location: [facility];  Service: Cardiovascular;  Laterality: Left;   PERIPHERAL VASCULAR BALLOON ANGIOPLASTY Right 05/17/2023   Procedure: PERIPHERAL VASCULAR BALLOON ANGIOPLASTY;  Surgeon: Pearline Norman RAMAN, MD;  Location: [facility];  Service: Cardiovascular;  Laterality: Right;  SFA-POP   TONSILLECTOMY     Social History   Occupational History   Not on file  Tobacco Use   Smoking status: Never   Smokeless tobacco: Never   Tobacco comments:    Never smoke 09/23/21  Vaping Use   Vaping status: Never Used  Substance and Sexual Activity   Alcohol  use: Yes    Alcohol /week: 2.0 standard drinks of alcohol     Types: 1 Cans of beer, 1 Shots of liquor per week    Comment: occasionally   Drug use: No   Sexual activity: Not on file

## 2023-12-28 ENCOUNTER — Ambulatory Visit (INDEPENDENT_AMBULATORY_CARE_PROVIDER_SITE_OTHER): Admitting: Physician Assistant

## 2023-12-28 DIAGNOSIS — I87331 Chronic venous hypertension (idiopathic) with ulcer and inflammation of right lower extremity: Secondary | ICD-10-CM

## 2023-12-28 DIAGNOSIS — I87319 Chronic venous hypertension (idiopathic) with ulcer of unspecified lower extremity: Secondary | ICD-10-CM

## 2023-12-28 NOTE — Progress Notes (Unsigned)
 Office Visit Note   Patient: Logan French           Date of Birth: 1934-10-22           MRN: [id number] Visit Date: 12/28/2023              Requested by: Yolande Toribio MATSU, MD [address] [place],  [place] [address] PCP: Yolande Toribio MATSU, MD  Chief Complaint  Patient presents with   Right Leg - Routine Post Op    12/15/2023 BLE I&D   Left Leg - Routine Post Op      HPI: 89 y/o male with chronic venous ulcers B LE that have failed conservative treatment. He is s/p excisional debridement of deep abscess bilateral lower extremities. He was discharged with Prevena wound vacs.   The vacs were discontinued due to seal leak.  He was placed in compression wraps on 12/23/23.  I asked if he could elevate multiple times a day and stop walking around as much to decrease the edema.    He has a history of PAD.   He recently underwent left lower extremity angiogram with shockwave lithotripsy of the left popliteal artery on Jul 20, 2023 with Dr. Serene due to foot wounds. He also underwent shockwave lithotripsy of the right popliteal artery and distal SFA with drug-coated balloon angioplasty of the segments in March for slow healing wounds.  ABI's on 10/22/23 demonstrated biphasic wave forms and right index of 100, left index of 0. 89.  He had a repeat angiogram on 10/27/23 .  On the left: There is diffuse atherosclerotic disease throughout.  Widely patent common femoral artery, profunda, superficial femoral artery.  Widely patent popliteal artery.  Single-vessel peroneal artery outflow to the foot filling the distal posterior tibial artery and dorsalis pedis through medial and lateral perforators respectively.  Posterior tibial artery and anterior tibial artery with flush occlusion proximally.   On the right: There is diffuse atherosclerotic disease throughout.  Widely patent common femoral artery, profunda, superficial femoral artery.  Widely patent profunda.  Two-vessel anterior tibial,  peroneal artery outflow to the foot continuing through the dorsalis pedis.  Plantar arteries not appreciated.  Patient with single-vessel runoff in the left leg, two-vessel runoff in the right leg.  No intervenable lesions.  Maximally revascularized.      Assessment & Plan: Visit Diagnoses: No diagnosis found.  Plan: Compression wraps B LE.  Elevation above the level of the heart.  Keep dressing clean and dry.    Follow-Up Instructions: No follow-ups on file.   Ortho Exam  Patient is alert, oriented, no adenopathy, well-dressed, normal affect, normal respiratory effort.          No edema good skin  Granulation tissue on all wounds 100 % other than the medial malleolus on the right ankle.    Right medial ankle was debrided with 4 x 4.  75 % yellow fibrinous.  Measures 2 cm x 2.5 cm Lateral wound 5 cm x 2.5 cm  Posterior 6.4 cm x 2 cm.    Left medial ankle 3.5 cm x 3.5 cm Proximal posterior 16 cm x 3.5 cm Distal posterior 4 cm x 3.5 cm  He is constantly moving his legs.       Imaging: No results found. No images are attached to the encounter.  Labs: Lab Results  Component Value Date   HGBA1C 6.2 (H) 10/06/2023   HGBA1C 6.4 (H) 06/17/2023   CRP 26.9 (H) 06/23/2023  CRP 27.5 (H) 06/22/2023   CRP 27.6 (H) 06/21/2023   LABURIC 5.0 06/22/2023   LABURIC 5.3 06/21/2023   REPTSTATUS 12/20/2023 FINAL 12/15/2023   GRAMSTAIN  12/15/2023    RARE WBC PRESENT, PREDOMINANTLY PMN ABUNDANT GRAM POSITIVE COCCI RARE GRAM NEGATIVE RODS    CULT  12/15/2023    MODERATE PROTEUS MIRABILIS MODERATE PROVIDENCIA STUARTII FEW KLEBSIELLA OXYTOCA FEW PSEUDOMONAS AERUGINOSA MODERATE STAPHYLOCOCCUS AUREUS SUSCEPTIBILITIES PERFORMED ON PREVIOUS CULTURE WITHIN THE LAST 5 DAYS. NO ANAEROBES ISOLATED Performed at [facility], [address], [place], [place] [address]    LABORGA PROTEUS MIRABILIS 12/15/2023   LABORGA KLEBSIELLA OXYTOCA 12/15/2023   LABORGA PSEUDOMONAS  AERUGINOSA 12/15/2023   LABORGA PROVIDENCIA STUARTII 12/15/2023     Lab Results  Component Value Date   ALBUMIN 2.7 (L) 12/15/2023   ALBUMIN 2.0 (L) 08/04/2023   ALBUMIN 2.2 (L) 08/02/2023    Lab Results  Component Value Date   MG 2.0 06/23/2023   MG 2.0 06/22/2023   MG 2.0 06/21/2023   Lab Results  Component Value Date   VD25OH 27.89 (L) 06/26/2023    No results found for: PREALBUMIN    Latest Ref Rng & Units 12/18/2023    9:26 AM 12/15/2023    7:28 AM 10/27/2023    7:08 AM  CBC EXTENDED  WBC 4.0 - 10.5 K/uL  6.7    RBC 4.22 - 5.81 MIL/uL  5.14    Hemoglobin 13.0 - 17.0 g/dL 88.8  88.1  88.3   HCT 39.0 - 52.0 % 36.8  39.9  34.0   Platelets 150 - 400 K/uL  267    NEUT# 1.7 - 7.7 K/uL  3.9    Lymph# 0.7 - 4.0 K/uL  1.6       There is no height or weight on file to calculate BMI.  Orders:  No orders of the defined types were placed in this encounter.  No orders of the defined types were placed in this encounter.    Procedures: No procedures performed  Clinical Data: No additional findings.  ROS:  All other systems negative, except as noted in the HPI. Review of Systems  Objective: Vital Signs: There were no vitals taken for this visit.  Specialty Comments:  No specialty comments available.  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 (HCC) 12/15/2023   Medication management 08/28/2023   Cellulitis 07/15/2023   Peripheral arterial disease 07/15/2023   Atrial fibrillation, chronic (HCC) 07/15/2023   Dyslipidemia 07/15/2023   BPH (benign prostatic hyperplasia) 07/15/2023   HTN (hypertension) 07/15/2023   DM (diabetes mellitus) (HCC) 07/15/2023   Bacteremia 06/25/2023   Sepsis due to undetermined organism (HCC) 06/21/2023   Cellulitis of right lower leg 06/21/2023   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) 06/20/2023    Streptococcal bacteremia 06/18/2023   Cellulitis of right lower extremity 06/17/2023   Persistent atrial fibrillation (HCC) 09/23/2021   Hypercoagulable state due to persistent atrial fibrillation (HCC) 09/23/2021   Callus 10/25/2019   Corns and callosities 10/25/2019   Hav (hallux abducto valgus), unspecified laterality 10/25/2019   Altered awareness, transient 06/23/2017   MALLET FINGER, RIGHT RING FINGER 10/19/2008   Past Medical History:  Diagnosis Date   A-fib (HCC)    B12 deficiency    Diverticulosis    Fatigue    History of colonic polyps    Hyperlipemia  Hypertension    Peripheral vascular disease    Pernicious anemia    Prostate cancer (HCC)    Syncope    Syncope     Family History  Problem Relation Age of Onset   Stroke Neg Hx    Transient ischemic attack Neg Hx     Past Surgical History:  Procedure Laterality Date   ABDOMINAL AORTOGRAM N/A 07/20/2023   Procedure: ABDOMINAL AORTOGRAM;  Surgeon: Serene Gaile ORN, MD;  Location: [facility];  Service: Cardiovascular;  Laterality: N/A;   ABDOMINAL AORTOGRAM W/LOWER EXTREMITY N/A 05/17/2023   Procedure: ABDOMINAL AORTOGRAM W/LOWER EXTREMITY;  Surgeon: Pearline Norman RAMAN, MD;  Location: [facility] INVASIVE CV LAB;  Service: Cardiovascular;  Laterality: N/A;   ABDOMINAL AORTOGRAM W/LOWER EXTREMITY N/A 10/27/2023   Procedure: ABDOMINAL AORTOGRAM W/LOWER EXTREMITY;  Surgeon: Lanis Fonda BRAVO, MD;  Location: [facility] INVASIVE CV LAB;  Service: Cardiovascular;  Laterality: N/A;   APPENDECTOMY     APPLICATION OF WOUND VAC Bilateral 12/15/2023   Procedure: APPLICATION, WOUND VAC;  Surgeon: Harden Jerona GAILS, MD;  Location: [facility];  Service: Orthopedics;  Laterality: Bilateral;   CARDIOVERSION N/A 10/07/2021   Procedure: CARDIOVERSION;  Surgeon: Sheena Pugh, DO;  Location: [facility];  Service: Cardiovascular;  Laterality: N/A;   INCISION AND DRAINAGE OF DEEP ABSCESS, CALF Bilateral 12/15/2023   Procedure: BILATERAL LEG INCISION AND  DRAINAGE OF DEEP ABSCESS;  Surgeon: Harden Jerona GAILS, MD;  Location: [facility];  Service: Orthopedics;  Laterality: Bilateral;   IR ANGIO INTRA EXTRACRAN SEL COM CAROTID INNOMINATE BILAT MOD SED  09/10/2017   IR ANGIO VERTEBRAL SEL SUBCLAVIAN INNOMINATE BILAT MOD SED  09/10/2017   LOWER EXTREMITY ANGIOGRAPHY N/A 07/20/2023   Procedure: Lower Extremity Angiography;  Surgeon: Serene Gaile ORN, MD;  Location: [facility];  Service: Cardiovascular;  Laterality: N/A;   LOWER EXTREMITY INTERVENTION Left 07/20/2023   Procedure: LOWER EXTREMITY INTERVENTION;  Surgeon: Serene Gaile ORN, MD;  Location: [facility];  Service: Cardiovascular;  Laterality: Left;   PERIPHERAL INTRAVASCULAR LITHOTRIPSY Right 05/17/2023   Procedure: PERIPHERAL INTRAVASCULAR LITHOTRIPSY;  Surgeon: Pearline Norman RAMAN, MD;  Location: [facility] INVASIVE CV LAB;  Service: Cardiovascular;  Laterality: Right;  SFA-POP   PERIPHERAL INTRAVASCULAR LITHOTRIPSY Left 07/20/2023   Procedure: PERIPHERAL INTRAVASCULAR LITHOTRIPSY;  Surgeon: Serene Gaile ORN, MD;  Location: [facility];  Service: Cardiovascular;  Laterality: Left;   PERIPHERAL VASCULAR BALLOON ANGIOPLASTY Right 05/17/2023   Procedure: PERIPHERAL VASCULAR BALLOON ANGIOPLASTY;  Surgeon: Pearline Norman RAMAN, MD;  Location: [facility];  Service: Cardiovascular;  Laterality: Right;  SFA-POP   TONSILLECTOMY     Social History   Occupational History   Not on file  Tobacco Use   Smoking status: Never   Smokeless tobacco: Never   Tobacco comments:    Never smoke 09/23/21  Vaping Use   Vaping status: Never Used  Substance and Sexual Activity   Alcohol  use: Yes    Alcohol /week: 2.0 standard drinks of alcohol     Types: 1 Cans of beer, 1 Shots of liquor per week    Comment: occasionally   Drug use: No   Sexual activity: Not on file

## 2023-12-29 ENCOUNTER — Encounter: Payer: Self-pay | Admitting: Physician Assistant

## 2023-12-31 DIAGNOSIS — R630 Anorexia: Secondary | ICD-10-CM | POA: Diagnosis not present

## 2023-12-31 DIAGNOSIS — K5900 Constipation, unspecified: Secondary | ICD-10-CM | POA: Diagnosis not present

## 2023-12-31 DIAGNOSIS — I959 Hypotension, unspecified: Secondary | ICD-10-CM | POA: Diagnosis not present

## 2023-12-31 DIAGNOSIS — R110 Nausea: Secondary | ICD-10-CM | POA: Diagnosis not present

## 2023-12-31 DIAGNOSIS — R634 Abnormal weight loss: Secondary | ICD-10-CM | POA: Diagnosis not present

## 2023-12-31 DIAGNOSIS — I10 Essential (primary) hypertension: Secondary | ICD-10-CM | POA: Diagnosis not present

## 2023-12-31 DIAGNOSIS — M6259 Muscle wasting and atrophy, not elsewhere classified, multiple sites: Secondary | ICD-10-CM | POA: Diagnosis not present

## 2023-12-31 DIAGNOSIS — I739 Peripheral vascular disease, unspecified: Secondary | ICD-10-CM | POA: Diagnosis not present

## 2023-12-31 DIAGNOSIS — L97919 Non-pressure chronic ulcer of unspecified part of right lower leg with unspecified severity: Secondary | ICD-10-CM | POA: Diagnosis not present

## 2023-12-31 DIAGNOSIS — E440 Moderate protein-calorie malnutrition: Secondary | ICD-10-CM | POA: Diagnosis not present

## 2023-12-31 DIAGNOSIS — I87311 Chronic venous hypertension (idiopathic) with ulcer of right lower extremity: Secondary | ICD-10-CM | POA: Diagnosis not present

## 2023-12-31 DIAGNOSIS — R69 Illness, unspecified: Secondary | ICD-10-CM | POA: Diagnosis not present

## 2024-01-04 ENCOUNTER — Encounter: Payer: Self-pay | Admitting: Physician Assistant

## 2024-01-04 ENCOUNTER — Ambulatory Visit: Admitting: Physician Assistant

## 2024-01-04 DIAGNOSIS — I890 Lymphedema, not elsewhere classified: Secondary | ICD-10-CM

## 2024-01-04 DIAGNOSIS — I87331 Chronic venous hypertension (idiopathic) with ulcer and inflammation of right lower extremity: Secondary | ICD-10-CM

## 2024-01-04 DIAGNOSIS — I87319 Chronic venous hypertension (idiopathic) with ulcer of unspecified lower extremity: Secondary | ICD-10-CM

## 2024-01-04 NOTE — Progress Notes (Signed)
 Office Visit Note   Patient: Logan French           Date of Birth: 1934/12/28           MRN: [id number] Visit Date: 01/04/2024              Requested by: Yolande Toribio MATSU, MD [address] [place],  [place] [address] PCP: Yolande Toribio MATSU, MD  Chief Complaint  Patient presents with   Right Leg - Routine Post Op    12/15/2023 I&D BLE    Left Leg - Routine Post Op      HPI:  89 y/o male with chronic venous ulcers B LE that have failed conservative treatment. He is s/p excisional debridement of deep abscess bilateral lower extremities. He was discharged with Prevena wound vacs.              The vacs were discontinued due to seal leak.  He was placed in compression wraps on 12/23/23.  I asked if he could elevate multiple times a day and stop walking around as much to decrease the edema.               He has a history of PAD.   He recently underwent left lower extremity angiogram with shockwave lithotripsy of the left popliteal artery on Jul 20, 2023 with Dr. Serene due to foot wounds. He also underwent shockwave lithotripsy of the right popliteal artery and distal SFA with drug-coated balloon angioplasty of the segments in March for slow healing wounds.  ABI's on 10/22/23 demonstrated biphasic wave forms and right index of 100, left index of 0. 89.  He had a repeat angiogram on 10/27/23 .   On the left: There is diffuse atherosclerotic disease throughout.  Widely patent common femoral artery, profunda, superficial femoral artery.  Widely patent popliteal artery.  Single-vessel peroneal artery outflow to the foot filling the distal posterior tibial artery and dorsalis pedis through medial and lateral perforators respectively.  Posterior tibial artery and anterior tibial artery with flush occlusion proximally.   On the right: There is diffuse atherosclerotic disease throughout.  Widely patent common femoral artery, profunda, superficial femoral artery.  Widely patent profunda.   Two-vessel anterior tibial, peroneal artery outflow to the foot continuing through the dorsalis pedis.  Plantar arteries not appreciated.  Patient with single-vessel runoff in the left leg, two-vessel runoff in the right leg.  No intervenable lesions.  Maximally revascularized.    He has a hospital bed at home post surgery and is doing much better with elevation.  His family is pleased.    Assessment & Plan: Visit Diagnoses:  1. Chronic venous hypertension (idiopathic) with ulcer and inflammation of right lower extremity (HCC)   2. 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 Lymphedema     Plan: Compression wraps, elevation.  Follow-Up Instructions: Return in about 1 week (around 01/11/2024).   Ortho Exam  Patient is alert, oriented, no adenopathy, well-dressed, normal affect, normal respiratory effort. Good skin lines with very little edema in both lower legs.  Left leg medial malleolus has yellow fibrinous tissue measuring 3.2 x 3 cm.  Posterior wounds proximal 5.5 x 5.5 with granulation base 100 %, middle wound 3.5 x 8.2 75% granulation tissue.  Distal posterior wound over the achillis 3.0 x 3.3 wound with yellow fibrinous tissue.     Right leg medial malleolus 1.2 cm x 1.2 cm 80% yellow fibrinous tissue he  allowed some debridement to healthy bleeding tissue central wound.  Medial leg wound 1.2 cm x 1.2 cm 100% granulation tissue.  Posterior 4.7 cm x 2.2 cm   Debridement with 4 x 4 and Vashe over all wound bases was tolerated better today.  10 blade sharp debridement was used on the malleolus areas B and the achillis area left LE.    Imaging: No results found.         Labs: Lab Results  Component Value Date   HGBA1C 6.2 (H) 10/06/2023   HGBA1C 6.4 (H) 06/17/2023   CRP 26.9 (H) 06/23/2023   CRP 27.5 (H) 06/22/2023   CRP 27.6 (H) 06/21/2023   LABURIC 5.0 06/22/2023   LABURIC 5.3 06/21/2023   REPTSTATUS 12/20/2023 FINAL  12/15/2023   GRAMSTAIN  12/15/2023    RARE WBC PRESENT, PREDOMINANTLY PMN ABUNDANT GRAM POSITIVE COCCI RARE GRAM NEGATIVE RODS    CULT  12/15/2023    MODERATE PROTEUS MIRABILIS MODERATE PROVIDENCIA STUARTII FEW KLEBSIELLA OXYTOCA FEW PSEUDOMONAS AERUGINOSA MODERATE STAPHYLOCOCCUS AUREUS SUSCEPTIBILITIES PERFORMED ON PREVIOUS CULTURE WITHIN THE LAST 5 DAYS. NO ANAEROBES ISOLATED Performed at [facility], [address] [place], [place] [address]    LABORGA PROTEUS MIRABILIS 12/15/2023   LABORGA KLEBSIELLA OXYTOCA 12/15/2023   LABORGA PSEUDOMONAS AERUGINOSA 12/15/2023   LABORGA PROVIDENCIA STUARTII 12/15/2023     Lab Results  Component Value Date   ALBUMIN 2.7 (L) 12/15/2023   ALBUMIN 2.0 (L) 08/04/2023   ALBUMIN 2.2 (L) 08/02/2023    Lab Results  Component Value Date   MG 2.0 06/23/2023   MG 2.0 06/22/2023   MG 2.0 06/21/2023   Lab Results  Component Value Date   VD25OH 27.89 (L) 06/26/2023    No results found for: PREALBUMIN    Latest Ref Rng & Units 12/18/2023    9:26 AM 12/15/2023    7:28 AM 10/27/2023    7:08 AM  CBC EXTENDED  WBC 4.0 - 10.5 K/uL  6.7    RBC 4.22 - 5.81 MIL/uL  5.14    Hemoglobin 13.0 - 17.0 g/dL 88.8  88.1  88.3   HCT 39.0 - 52.0 % 36.8  39.9  34.0   Platelets 150 - 400 K/uL  267    NEUT# 1.7 - 7.7 K/uL  3.9    Lymph# 0.7 - 4.0 K/uL  1.6       There is no height or weight on file to calculate BMI.  Orders:  No orders of the defined types were placed in this encounter.  No orders of the defined types were placed in this encounter.    Procedures: No procedures performed  Clinical Data: No additional findings.  ROS:  All other systems negative, except as noted in the HPI. Review of Systems  Objective: Vital Signs: There were no vitals taken for this visit.  Specialty Comments:  No specialty comments available.  PMFS History: Patient Active Problem List   Diagnosis Date Noted   Chronic venous htn w ulcer and  inflam of bilateral low extrm (HCC) 12/15/2023   Medication management 08/28/2023   Cellulitis 07/15/2023   Peripheral arterial disease 07/15/2023   Atrial fibrillation, chronic (HCC) 07/15/2023   Dyslipidemia 07/15/2023   BPH (benign prostatic hyperplasia) 07/15/2023   HTN (hypertension) 07/15/2023   DM (diabetes mellitus) (HCC) 07/15/2023   Bacteremia 06/25/2023   Sepsis due to undetermined organism (HCC) 06/21/2023   Cellulitis of right lower leg 06/21/2023   Non-pressure chronic ulcer of right ankle limited to breakdown  of skin (HCC) 06/20/2023   Streptococcal bacteremia 06/18/2023   Cellulitis of right lower extremity 06/17/2023   Persistent atrial fibrillation (HCC) 09/23/2021   Hypercoagulable state due to persistent atrial fibrillation (HCC) 09/23/2021   Callus 10/25/2019   Corns and callosities 10/25/2019   Hav (hallux abducto valgus), unspecified laterality 10/25/2019   Altered awareness, transient 06/23/2017   MALLET FINGER, RIGHT RING FINGER 10/19/2008   Past Medical History:  Diagnosis Date   A-fib (HCC)    B12 deficiency    Diverticulosis    Fatigue    History of colonic polyps    Hyperlipemia    Hypertension    Peripheral vascular disease    Pernicious anemia    Prostate cancer (HCC)    Syncope    Syncope     Family History  Problem Relation Age of Onset   Stroke Neg Hx    Transient ischemic attack Neg Hx     Past Surgical History:  Procedure Laterality Date   ABDOMINAL AORTOGRAM N/A 07/20/2023   Procedure: ABDOMINAL AORTOGRAM;  Surgeon: Serene Gaile ORN, MD;  Location: [facility] INVAS[facility] LAB;  Service: Cardiovascular;  Laterality: N/A;   ABDOMINAL AORTOGRAM W/LOWER EXTREMITY N/A 05/17/2023   Procedure: ABDOMINAL AORTOGRAM W/LOWER EXTREMITY;  Surgeon: Pearline Norman RAMAN, MD;  Location: [facility] INVASIVE CV LAB;  Service: Cardiovascular;  Laterality: N/A;   ABDOMINAL AORTOGRAM W/LOWER EXTREMITY N/A 10/27/2023   Procedure: ABDOMINAL AORTOGRAM W/LOWER EXTREMITY;   Surgeon: Lanis Fonda BRAVO, MD;  Location: [facility] INVASIVE CV LAB;  Service: Cardiovascular;  Laterality: N/A;   APPENDECTOMY     APPLICATION OF WOUND VAC Bilateral 12/15/2023   Procedure: APPLICATION, WOUND VAC;  Surgeon: Harden Jerona GAILS, MD;  Location: [facility] OR;  Service: Orthopedics;  Laterality: Bilateral;   CARDIOVERSION N/A 10/07/2021   Procedure: CARDIOVERSION;  Surgeon: Sheena Pugh, DO;  Location: [facility]OS[facility];  Service: Cardiovascular;  Laterality: N/A;   INCISION AND DRAINAGE OF DEEP ABSCESS, CALF Bilateral 12/15/2023   Procedure: BILATERAL LEG INCISION AND DRAINAGE OF DEEP ABSCESS;  Surgeon: Harden Jerona GAILS, MD;  Location: [facility] OR;  Service: Orthopedics;  Laterality: Bilateral;   IR ANGIO INTRA EXTRACRAN SEL COM CAROTID INNOMINATE BILAT MOD SED  09/10/2017   IR ANGIO VERTEBRAL SEL SUBCLAVIAN INNOMINATE BILAT MOD SED  09/10/2017   LOWER EXTREMITY ANGIOGRAPHY N/A 07/20/2023   Procedure: Lower Extremity Angiography;  Surgeon: Serene Gaile ORN, MD;  Location: [facility];  Service: Cardiovascular;  Laterality: N/A;   LOWER EXTREMITY INTERVENTION Left 07/20/2023   Procedure: LOWER EXTREMITY INTERVENTION;  Surgeon: Serene Gaile ORN, MD;  Location: [facility];  Service: Cardiovascular;  Laterality: Left;   PERIPHERAL INTRAVASCULAR LITHOTRIPSY Right 05/17/2023   Procedure: PERIPHERAL INTRAVASCULAR LITHOTRIPSY;  Surgeon: Pearline Norman RAMAN, MD;  Location: [facility] INVASIVE CV LAB;  Service: Cardiovascular;  Laterality: Right;  SFA-POP   PERIPHERAL INTRAVASCULAR LITHOTRIPSY Left 07/20/2023   Procedure: PERIPHERAL INTRAVASCULAR LITHOTRIPSY;  Surgeon: Serene Gaile ORN, MD;  Location: [facility];  Service: Cardiovascular;  Laterality: Left;   PERIPHERAL VASCULAR BALLOON ANGIOPLASTY Right 05/17/2023   Procedure: PERIPHERAL VASCULAR BALLOON ANGIOPLASTY;  Surgeon: Pearline Norman RAMAN, MD;  Location: [facility];  Service: Cardiovascular;  Laterality: Right;  SFA-POP   TONSILLECTOMY     Social  History   Occupational History   Not on file  Tobacco Use   Smoking status: Never   Smokeless tobacco: Never   Tobacco comments:    Never smoke 09/23/21  Vaping Use   Vaping status:  Never Used  Substance and Sexual Activity   Alcohol  use: Yes    Alcohol /week: 2.0 standard drinks of alcohol     Types: 1 Cans of beer, 1 Shots of liquor per week    Comment: occasionally   Drug use: No   Sexual activity: Not on file

## 2024-01-11 ENCOUNTER — Ambulatory Visit (INDEPENDENT_AMBULATORY_CARE_PROVIDER_SITE_OTHER): Admitting: Physician Assistant

## 2024-01-11 ENCOUNTER — Encounter: Payer: Self-pay | Admitting: Physician Assistant

## 2024-01-11 DIAGNOSIS — I87331 Chronic venous hypertension (idiopathic) with ulcer and inflammation of right lower extremity: Secondary | ICD-10-CM

## 2024-01-11 DIAGNOSIS — I87319 Chronic venous hypertension (idiopathic) with ulcer of unspecified lower extremity: Secondary | ICD-10-CM

## 2024-01-11 NOTE — Progress Notes (Signed)
 Office Visit Note   Patient: Logan French           Date of Birth: 1934-05-07           MRN: [id number] Visit Date: 01/11/2024              Requested by: Yolande Toribio MATSU, MD [address] [place],  [place] [address] PCP: Yolande Toribio MATSU, MD  Chief Complaint  Patient presents with   Right Leg - Routine Post Op    12/15/2023 BLE I&D wounds    Left Leg - Routine Post Op      HPI: 89 y/o male with chronic venous ulcers B LE that have failed conservative treatment. He is s/p excisional debridement of deep abscess bilateral lower extremities. He was discharged with Prevena wound vacs.              The vacs were discontinued due to seal leak.  He was placed in compression wraps on 12/23/23.  I asked if he could elevate multiple times a day and stop walking around as much to decrease the edema.               He has a history of PAD.   He recently underwent left lower extremity angiogram with shockwave lithotripsy of the left popliteal artery on Jul 20, 2023 with Dr. Serene due to foot wounds. He also underwent shockwave lithotripsy of the right popliteal artery and distal SFA with drug-coated balloon angioplasty of the segments in March for slow healing wounds.  ABI's on 10/22/23 demonstrated biphasic wave forms and right index of 100, left index of 0. 89.  He had a repeat angiogram on 10/27/23 .   On the left: There is diffuse atherosclerotic disease throughout.  Widely patent common femoral artery, profunda, superficial femoral artery.  Widely patent popliteal artery.  Single-vessel peroneal artery outflow to the foot filling the distal posterior tibial artery and dorsalis pedis through medial and lateral perforators respectively.  Posterior tibial artery and anterior tibial artery with flush occlusion proximally.   On the right: There is diffuse atherosclerotic disease throughout.  Widely patent common femoral artery, profunda, superficial femoral artery.  Widely patent profunda.   Two-vessel anterior tibial, peroneal artery outflow to the foot continuing through the dorsalis pedis.  Plantar arteries not appreciated.  Patient with single-vessel runoff in the left leg, two-vessel runoff in the right leg.  No intervenable lesions.  Maximally revascularized.                          He has a hospital bed at home post surgery and is doing much better with elevation.  His family is pleased with his progress.   Assessment & Plan: Visit Diagnoses:  1. 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 Chronic venous hypertension (idiopathic) with ulcer and inflammation of right lower extremity (HCC)     Plan: Compression wraps, elevation.   Follow-Up Instructions: No follow-ups on file.   Ortho Exam  Patient is alert, oriented, no adenopathy, well-dressed, normal affect, normal respiratory effort. Bilateral LE with decreased edema and good skin lines.    The right LE medial malleolus 2.2 cm x 2.3 cm, Achillis 4.8 cm x 1.5 cm, lateral leg 4 cm x 1.4 cm.  Medial leg wound 1 cm x 1 cm.  The left medial malleolus 2.9 cm x 2.5 cm, proximal posterior 5.9 cm x 5  cm, middle posterior 7.3 cm x 3.3 cm, distal achillis area 3.5 cm x 2.5 cm.     10 Blade debridement yellow fibrinous tissue was removed until he could no longer tolerate it.     Imaging: No results found.       Labs: Lab Results  Component Value Date   HGBA1C 6.2 (H) 10/06/2023   HGBA1C 6.4 (H) 06/17/2023   CRP 26.9 (H) 06/23/2023   CRP 27.5 (H) 06/22/2023   CRP 27.6 (H) 06/21/2023   LABURIC 5.0 06/22/2023   LABURIC 5.3 06/21/2023   REPTSTATUS 12/20/2023 FINAL 12/15/2023   GRAMSTAIN  12/15/2023    RARE WBC PRESENT, PREDOMINANTLY PMN ABUNDANT GRAM POSITIVE COCCI RARE GRAM NEGATIVE RODS    CULT  12/15/2023    MODERATE PROTEUS MIRABILIS MODERATE PROVIDENCIA STUARTII FEW KLEBSIELLA OXYTOCA FEW PSEUDOMONAS AERUGINOSA MODERATE STAPHYLOCOCCUS  AUREUS SUSCEPTIBILITIES PERFORMED ON PREVIOUS CULTURE WITHIN THE LAST 5 DAYS. NO ANAEROBES ISOLATED Performed at [facility], [address] [place], [place] [address]    LABORGA PROTEUS MIRABILIS 12/15/2023   LABORGA KLEBSIELLA OXYTOCA 12/15/2023   LABORGA PSEUDOMONAS AERUGINOSA 12/15/2023   LABORGA PROVIDENCIA STUARTII 12/15/2023     Lab Results  Component Value Date   ALBUMIN 2.7 (L) 12/15/2023   ALBUMIN 2.0 (L) 08/04/2023   ALBUMIN 2.2 (L) 08/02/2023    Lab Results  Component Value Date   MG 2.0 06/23/2023   MG 2.0 06/22/2023   MG 2.0 06/21/2023   Lab Results  Component Value Date   VD25OH 27.89 (L) 06/26/2023    No results found for: PREALBUMIN    Latest Ref Rng & Units 12/18/2023    9:26 AM 12/15/2023    7:28 AM 10/27/2023    7:08 AM  CBC EXTENDED  WBC 4.0 - 10.5 K/uL  6.7    RBC 4.22 - 5.81 MIL/uL  5.14    Hemoglobin 13.0 - 17.0 g/dL 88.8  88.1  88.3   HCT 39.0 - 52.0 % 36.8  39.9  34.0   Platelets 150 - 400 K/uL  267    NEUT# 1.7 - 7.7 K/uL  3.9    Lymph# 0.7 - 4.0 K/uL  1.6       There is no height or weight on file to calculate BMI.  Orders:  No orders of the defined types were placed in this encounter.  No orders of the defined types were placed in this encounter.    Procedures: No procedures performed  Clinical Data: No additional findings.  ROS:  All other systems negative, except as noted in the HPI. Review of Systems  Objective: Vital Signs: There were no vitals taken for this visit.  Specialty Comments:  No specialty comments available.  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 (HCC) 12/15/2023   Medication management 08/28/2023   Cellulitis 07/15/2023   Peripheral arterial disease 07/15/2023   Atrial fibrillation, chronic (HCC) 07/15/2023   Dyslipidemia 07/15/2023   BPH (benign prostatic hyperplasia) 07/15/2023   HTN  (hypertension) 07/15/2023   DM (diabetes mellitus) (HCC) 07/15/2023   Bacteremia 06/25/2023   Sepsis due to undetermined organism (HCC) 06/21/2023   Cellulitis of right lower leg 06/21/2023   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) 06/20/2023   Streptococcal bacteremia 06/18/2023   Cellulitis of right lower extremity 06/17/2023   Persistent atrial fibrillation (HCC) 09/23/2021   Hypercoagulable state due  to persistent atrial fibrillation (HCC) 09/23/2021   Callus 10/25/2019   Corns and callosities 10/25/2019   Hav (hallux abducto valgus), unspecified laterality 10/25/2019   Altered awareness, transient 06/23/2017   MALLET FINGER, RIGHT RING FINGER 10/19/2008   Past Medical History:  Diagnosis Date   A-fib (HCC)    B12 deficiency    Diverticulosis    Fatigue    History of colonic polyps    Hyperlipemia    Hypertension    Peripheral vascular disease    Pernicious anemia    Prostate cancer (HCC)    Syncope    Syncope     Family History  Problem Relation Age of Onset   Stroke Neg Hx    Transient ischemic attack Neg Hx     Past Surgical History:  Procedure Laterality Date   ABDOMINAL AORTOGRAM N/A 07/20/2023   Procedure: ABDOMINAL AORTOGRAM;  Surgeon: Serene Gaile ORN, MD;  Location: [facility];  Service: Cardiovascular;  Laterality: N/A;   ABDOMINAL AORTOGRAM W/LOWER EXTREMITY N/A 05/17/2023   Procedure: ABDOMINAL AORTOGRAM W/LOWER EXTREMITY;  Surgeon: Pearline Norman RAMAN, MD;  Location: [facility] INVASIVE CV LAB;  Service: Cardiovascular;  Laterality: N/A;   ABDOMINAL AORTOGRAM W/LOWER EXTREMITY N/A 10/27/2023   Procedure: ABDOMINAL AORTOGRAM W/LOWER EXTREMITY;  Surgeon: Lanis Fonda BRAVO, MD;  Location: [facility] INVASIVE CV LAB;  Service: Cardiovascular;  Laterality: N/A;   APPENDECTOMY     APPLICATION OF WOUND VAC Bilateral 12/15/2023   Procedure: APPLICATION, WOUND VAC;  Surgeon: Harden Jerona GAILS, MD;  Location: [facility] OR;  Service: Orthopedics;   Laterality: Bilateral;   CARDIOVERSION N/A 10/07/2021   Procedure: CARDIOVERSION;  Surgeon: Sheena Pugh, DO;  Location: [facility];  Service: Cardiovascular;  Laterality: N/A;   INCISION AND DRAINAGE OF DEEP ABSCESS, CALF Bilateral 12/15/2023   Procedure: BILATERAL LEG INCISION AND DRAINAGE OF DEEP ABSCESS;  Surgeon: Harden Jerona GAILS, MD;  Location: [facility] OR;  Service: Orthopedics;  Laterality: Bilateral;   IR ANGIO INTRA EXTRACRAN SEL COM CAROTID INNOMINATE BILAT MOD SED  09/10/2017   IR ANGIO VERTEBRAL SEL SUBCLAVIAN INNOMINATE BILAT MOD SED  09/10/2017   LOWER EXTREMITY ANGIOGRAPHY N/A 07/20/2023   Procedure: Lower Extremity Angiography;  Surgeon: Serene Gaile ORN, MD;  Location: [facility];  Service: Cardiovascular;  Laterality: N/A;   LOWER EXTREMITY INTERVENTION Left 07/20/2023   Procedure: LOWER EXTREMITY INTERVENTION;  Surgeon: Serene Gaile ORN, MD;  Location: [facility];  Service: Cardiovascular;  Laterality: Left;   PERIPHERAL INTRAVASCULAR LITHOTRIPSY Right 05/17/2023   Procedure: PERIPHERAL INTRAVASCULAR LITHOTRIPSY;  Surgeon: Pearline Norman RAMAN, MD;  Location: [facility] INVASIVE CV LAB;  Service: Cardiovascular;  Laterality: Right;  SFA-POP   PERIPHERAL INTRAVASCULAR LITHOTRIPSY Left 07/20/2023   Procedure: PERIPHERAL INTRAVASCULAR LITHOTRIPSY;  Surgeon: Serene Gaile ORN, MD;  Location: [facility];  Service: Cardiovascular;  Laterality: Left;   PERIPHERAL VASCULAR BALLOON ANGIOPLASTY Right 05/17/2023   Procedure: PERIPHERAL VASCULAR BALLOON ANGIOPLASTY;  Surgeon: Pearline Norman RAMAN, MD;  Location: [facility];  Service: Cardiovascular;  Laterality: Right;  SFA-POP   TONSILLECTOMY     Social History   Occupational History   Not on file  Tobacco Use   Smoking status: Never   Smokeless tobacco: Never   Tobacco comments:    Never smoke 09/23/21  Vaping Use   Vaping status: Never Used  Substance and Sexual Activity   Alcohol  use: Yes    Alcohol /week: 2.0  standard drinks of alcohol     Types: 1 Cans of beer,  1 Shots of liquor per week    Comment: occasionally   Drug use: No   Sexual activity: Not on file

## 2024-01-17 ENCOUNTER — Encounter: Payer: Self-pay | Admitting: Radiology

## 2024-01-18 ENCOUNTER — Other Ambulatory Visit: Payer: Self-pay | Admitting: Cardiology

## 2024-01-18 ENCOUNTER — Encounter: Admitting: Family

## 2024-01-18 ENCOUNTER — Ambulatory Visit (INDEPENDENT_AMBULATORY_CARE_PROVIDER_SITE_OTHER): Admitting: Physician Assistant

## 2024-01-18 ENCOUNTER — Encounter: Payer: Self-pay | Admitting: Physician Assistant

## 2024-01-18 DIAGNOSIS — I87319 Chronic venous hypertension (idiopathic) with ulcer of unspecified lower extremity: Secondary | ICD-10-CM

## 2024-01-18 DIAGNOSIS — I87313 Chronic venous hypertension (idiopathic) with ulcer of bilateral lower extremity: Secondary | ICD-10-CM

## 2024-01-18 DIAGNOSIS — I890 Lymphedema, not elsewhere classified: Secondary | ICD-10-CM | POA: Diagnosis not present

## 2024-01-18 NOTE — Progress Notes (Signed)
 Office Visit Note   Patient: Logan French           Date of Birth: [birth year]           MRN: [id number] Visit Date: 01/18/2024              Requested by: Yolande Toribio MATSU, MD [address] [place],  [place] [address] PCP: Yolande Toribio MATSU, MD  Chief Complaint  Patient presents with   Right Leg - Routine Post Op    12/15/2023 BLE I&D wounds    Left Leg - Routine Post Op      HPI: 89 y/o male with chronic venous ulcers B LE that have failed conservative treatment. He is s/p excisional debridement of deep abscess bilateral lower extremities. He was discharged with Prevena wound vacs on 12/15/23.  He has returned weekly for debridement and compression wraps.    He has improved edema with the help of elevation using a hospital bed.    Assessment & Plan: Visit Diagnoses: No diagnosis found.  Plan: Reapply dynaflex wraps bilateral LE.  Elevation.    Follow-Up Instructions: No follow-ups on file.   Ortho Exam  Patient is alert, oriented, no adenopathy, well-dressed, normal affect, normal respiratory effort. Yellow fibrinous tissue debrided multiple wounds.     The right LE medial malleolus 2.2 cm x 2.3 cm, Achillis 4.5 cm x 1.5 cm, lateral leg 3 cm x 1.2 cm.  Medial leg wound 1 cm x 1 cm.   The left medial malleolus 2.8 cm x 2.7 cm, proximal posterior 5.5 cm x 5 cm, middle posterior 7 cm x 4.5 cm, distal achillis area 3.5 cm x 2.5 cm.    Imaging: No results found.        Labs: Lab Results  Component Value Date   HGBA1C 6.2 (H) 10/06/2023   HGBA1C 6.4 (H) 06/17/2023   CRP 26.9 (H) 06/23/2023   CRP 27.5 (H) 06/22/2023   CRP 27.6 (H) 06/21/2023   LABURIC 5.0 06/22/2023   LABURIC 5.3 06/21/2023   REPTSTATUS 12/20/2023 FINAL 12/15/2023   GRAMSTAIN  12/15/2023    RARE WBC PRESENT, PREDOMINANTLY PMN ABUNDANT GRAM POSITIVE COCCI RARE GRAM NEGATIVE RODS    CULT  12/15/2023    MODERATE PROTEUS MIRABILIS MODERATE PROVIDENCIA STUARTII FEW KLEBSIELLA  OXYTOCA FEW PSEUDOMONAS AERUGINOSA MODERATE STAPHYLOCOCCUS AUREUS SUSCEPTIBILITIES PERFORMED ON PREVIOUS CULTURE WITHIN THE LAST 5 DAYS. NO ANAEROBES ISOLATED Performed at [facility], [address] [place], [place] [address]    LABORGA PROTEUS MIRABILIS 12/15/2023   LABORGA KLEBSIELLA OXYTOCA 12/15/2023   LABORGA PSEUDOMONAS AERUGINOSA 12/15/2023   LABORGA PROVIDENCIA STUARTII 12/15/2023     Lab Results  Component Value Date   ALBUMIN 2.7 (L) 12/15/2023   ALBUMIN 2.0 (L) 08/04/2023   ALBUMIN 2.2 (L) 08/02/2023    Lab Results  Component Value Date   MG 2.0 06/23/2023   MG 2.0 06/22/2023   MG 2.0 06/21/2023   Lab Results  Component Value Date   VD25OH 27.89 (L) 06/26/2023    No results found for: PREALBUMIN    Latest Ref Rng & Units 12/18/2023    9:26 AM 12/15/2023    7:28 AM 10/27/2023    7:08 AM  CBC EXTENDED  WBC 4.0 - 10.5 K/uL  6.7    RBC 4.22 - 5.81 MIL/uL  5.14    Hemoglobin 13.0 - 17.0 g/dL 88.8  88.1  88.3   HCT 39.0 - 52.0 % 36.8  39.9  34.0  Platelets 150 - 400 K/uL  267    NEUT# 1.7 - 7.7 K/uL  3.9    Lymph# 0.7 - 4.0 K/uL  1.6       There is no height or weight on file to calculate BMI.  Orders:  No orders of the defined types were placed in this encounter.  No orders of the defined types were placed in this encounter.    Procedures: No procedures performed  Clinical Data: No additional findings.  ROS:  All other systems negative, except as noted in the HPI. Review of Systems  Objective: Vital Signs: There were no vitals taken for this visit.  Specialty Comments:  No specialty comments available.  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 (HCC) 12/15/2023   Medication management 08/28/2023   Cellulitis 07/15/2023   Peripheral arterial disease 07/15/2023   Atrial fibrillation, chronic (HCC) 07/15/2023   Dyslipidemia 07/15/2023    BPH (benign prostatic hyperplasia) 07/15/2023   HTN (hypertension) 07/15/2023   DM (diabetes mellitus) (HCC) 07/15/2023   Bacteremia 06/25/2023   Sepsis due to undetermined organism (HCC) 06/21/2023   Cellulitis of right lower leg 06/21/2023   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) 06/20/2023   Streptococcal bacteremia 06/18/2023   Cellulitis of right lower extremity 06/17/2023   Persistent atrial fibrillation (HCC) 09/23/2021   Hypercoagulable state due to persistent atrial fibrillation (HCC) 09/23/2021   Callus 10/25/2019   Corns and callosities 10/25/2019   Hav (hallux abducto valgus), unspecified laterality 10/25/2019   Altered awareness, transient 06/23/2017   MALLET FINGER, RIGHT RING FINGER 10/19/2008   Past Medical History:  Diagnosis Date   A-fib (HCC)    B12 deficiency    Diverticulosis    Fatigue    History of colonic polyps    Hyperlipemia    Hypertension    Peripheral vascular disease    Pernicious anemia    Prostate cancer (HCC)    Syncope    Syncope     Family History  Problem Relation Age of Onset   Stroke Neg Hx    Transient ischemic attack Neg Hx     Past Surgical History:  Procedure Laterality Date   ABDOMINAL AORTOGRAM N/A 07/20/2023   Procedure: ABDOMINAL AORTOGRAM;  Surgeon: Serene Gaile ORN, MD;  [facility];  Service: Cardiovascular;  Laterality: N/A;   ABDOMINAL AORTOGRAM W/LOWER EXTREMITY N/A 05/17/2023   Procedure: ABDOMINAL AORTOGRAM W/LOWER EXTREMITY;  Surgeon: Pearline Norman RAMAN, MD;  Location: [facility] INVASIVE CV LAB;  Service: Cardiovascular;  Laterality: N/A;   ABDOMINAL AORTOGRAM W/LOWER EXTREMITY N/A 10/27/2023   Procedure: ABDOMINAL AORTOGRAM W/LOWER EXTREMITY;  Surgeon: Lanis Fonda BRAVO, MD;  Location: [facility] INVASIVE CV LAB;  Service: Cardiovascular;  Laterality: N/A;   APPENDECTOMY     APPLICATION OF WOUND VAC Bilateral 12/15/2023   Procedure: APPLICATION, WOUND VAC;  Surgeon: Harden Jerona GAILS, MD;  Location: [facility] OR;  Service: Orthopedics;  Laterality: Bilateral;   CARDIOVERSION N/A 10/07/2021   Procedure: CARDIOVERSION;  Surgeon: Sheena Pugh, DO;  [facility];  Service: Cardiovascular;  Laterality: N/A;   INCISION AND DRAINAGE OF DEEP ABSCESS, CALF Bilateral 12/15/2023   Procedure: BILATERAL LEG INCISION AND DRAINAGE OF DEEP ABSCESS;  Surgeon: Harden Jerona GAILS, MD;  Location: [facility];  Service: Orthopedics;  Laterality: Bilateral;   IR ANGIO INTRA EXTRACRAN SEL COM CAROTID INNOMINATE BILAT MOD SED  09/10/2017   IR ANGIO VERTEBRAL SEL SUBCLAVIAN INNOMINATE BILAT MOD SED  09/10/2017   LOWER EXTREMITY ANGIOGRAPHY N/A 07/20/2023   Procedure: Lower Extremity Angiography;  Surgeon: Serene Gaile ORN, MD;  [facility];  Service: Cardiovascular;  Laterality: N/A;   LOWER EXTREMITY INTERVENTION Left 07/20/2023   Procedure: LOWER EXTREMITY INTERVENTION;  Surgeon: Serene Gaile ORN, MD;  [facility];  Service: Cardiovascular;  Laterality: Left;   PERIPHERAL INTRAVASCULAR LITHOTRIPSY Right 05/17/2023   Procedure: PERIPHERAL INTRAVASCULAR LITHOTRIPSY;  Surgeon: Pearline Norman RAMAN, MD;  Location: [facility] INVASIVE CV LAB;  Service: Cardiovascular;  Laterality: Right;  SFA-POP   PERIPHERAL INTRAVASCULAR LITHOTRIPSY Left 07/20/2023   Procedure: PERIPHERAL INTRAVASCULAR LITHOTRIPSY;  Surgeon: Serene Gaile ORN, MD;  [facility];  Service: Cardiovascular;  Laterality: Left;   PERIPHERAL VASCULAR BALLOON ANGIOPLASTY Right 05/17/2023   Procedure: PERIPHERAL VASCULAR BALLOON ANGIOPLASTY;  Surgeon: Pearline Norman RAMAN, MD;  [facility];  Service: Cardiovascular;  Laterality: Right;  SFA-POP   TONSILLECTOMY     Social History   Occupational History   Not on file  Tobacco Use   Smoking status: Never   Smokeless tobacco: Never   Tobacco comments:    Never smoke 09/23/21  Vaping Use   Vaping status: Never Used  Substance and Sexual  Activity   Alcohol  use: Yes    Alcohol /week: 2.0 standard drinks of alcohol     Types: 1 Cans of beer, 1 Shots of liquor per week    Comment: occasionally   Drug use: No   Sexual activity: Not on file

## 2024-01-20 MED ORDER — ROSUVASTATIN CALCIUM 20 MG PO TABS: 20.0000 mg | ORAL_TABLET | Freq: Every day | ORAL | 8 refills | Status: AC

## 2024-01-25 ENCOUNTER — Ambulatory Visit (INDEPENDENT_AMBULATORY_CARE_PROVIDER_SITE_OTHER): Admitting: Physician Assistant

## 2024-01-25 ENCOUNTER — Encounter: Payer: Self-pay | Admitting: Physician Assistant

## 2024-01-25 DIAGNOSIS — I890 Lymphedema, not elsewhere classified: Secondary | ICD-10-CM

## 2024-01-25 DIAGNOSIS — I87319 Chronic venous hypertension (idiopathic) with ulcer of unspecified lower extremity: Secondary | ICD-10-CM

## 2024-01-25 DIAGNOSIS — I87331 Chronic venous hypertension (idiopathic) with ulcer and inflammation of right lower extremity: Secondary | ICD-10-CM

## 2024-01-25 NOTE — Progress Notes (Signed)
 Office Visit Note   Patient: Logan French           Date of Birth: [birth year]           MRN: [id number] Visit Date: 01/25/2024              Requested by: Yolande Toribio MATSU, MD [address] [place],  [place] [address] PCP: Yolande Toribio MATSU, MD  Chief Complaint  Patient presents with   Left Leg - Routine Post Op    12/15/2023 BLE I&D wounds    Right Leg - Routine Post Op      HPI: 89 y/o male with chronic venous ulcers B LE that have failed conservative treatment. He is s/p excisional debridement of deep abscess bilateral lower extremities. He is elevating more with the help of a hospital bed at home.  He noted some pain and itching over the right anterior ankle.    Assessment & Plan: Visit Diagnoses:  1. Chronic venous hypertension (idiopathic) with ulcer and inflammation of right lower extremity (HCC)   2. Lymphedema   3. Venous ulcer of lower extremity due to chronic peripheral venous hypertension (HCC)     Plan: Reapply the dynaflex dressing B LE.  I ask him to make sure and elevate multiple times daily and keep his activity to a minimum to prevent the dressing from moving and creating skin irritation.    Follow-Up Instructions: Return in about 1 week (around 02/01/2024).   Ortho Exam  Patient is alert, oriented, no adenopathy, well-dressed, normal affect, normal respiratory effort.  Good skin lines with decreased edema.  There is superficial skin irritation to the anterior right ankle   Yellow fibrinous tissue debrided multiple wounds.      The right LE medial malleolus 2 cm x 2 cm, Achillis 4 cm x 1 cm, lateral leg 0.5 cm x 1 cm.  Medial leg wound 1 cm x 1 cm.   The left medial malleolus 2.1 cm x 2.7 cm, proximal posterior 4.5 cm x 5 cm, middle posterior 7 cm x 3 cm, distal achillis area 4 cm x 4 cm.    Imaging: No results found.        Labs: Lab Results  Component Value Date   HGBA1C 6.2 (H) 10/06/2023   HGBA1C 6.4 (H) 06/17/2023   CRP 26.9  (H) 06/23/2023   CRP 27.5 (H) 06/22/2023   CRP 27.6 (H) 06/21/2023   LABURIC 5.0 06/22/2023   LABURIC 5.3 06/21/2023   REPTSTATUS 12/20/2023 FINAL 12/15/2023   GRAMSTAIN  12/15/2023    RARE WBC PRESENT, PREDOMINANTLY PMN ABUNDANT GRAM POSITIVE COCCI RARE GRAM NEGATIVE RODS    CULT  12/15/2023    MODERATE PROTEUS MIRABILIS MODERATE PROVIDENCIA STUARTII FEW KLEBSIELLA OXYTOCA FEW PSEUDOMONAS AERUGINOSA MODERATE STAPHYLOCOCCUS AUREUS SUSCEPTIBILITIES PERFORMED ON PREVIOUS CULTURE WITHIN THE LAST 5 DAYS. NO ANAEROBES ISOLATED Performed at [facility], [address] [place], [place] [address]    LABORGA PROTEUS MIRABILIS 12/15/2023   LABORGA KLEBSIELLA OXYTOCA 12/15/2023   LABORGA PSEUDOMONAS AERUGINOSA 12/15/2023   LABORGA PROVIDENCIA STUARTII 12/15/2023     Lab Results  Component Value Date   ALBUMIN 2.7 (L) 12/15/2023   ALBUMIN 2.0 (L) 08/04/2023   ALBUMIN 2.2 (L) 08/02/2023    Lab Results  Component Value Date   MG 2.0 06/23/2023   MG 2.0 06/22/2023   MG 2.0 06/21/2023   Lab Results  Component Value Date   VD25OH 27.89 (L) 06/26/2023    No results  found for: PREALBUMIN    Latest Ref Rng & Units 12/18/2023    9:26 AM 12/15/2023    7:28 AM 10/27/2023    7:08 AM  CBC EXTENDED  WBC 4.0 - 10.5 K/uL  6.7    RBC 4.22 - 5.81 MIL/uL  5.14    Hemoglobin 13.0 - 17.0 g/dL 88.8  88.1  88.3   HCT 39.0 - 52.0 % 36.8  39.9  34.0   Platelets 150 - 400 K/uL  267    NEUT# 1.7 - 7.7 K/uL  3.9    Lymph# 0.7 - 4.0 K/uL  1.6       There is no height or weight on file to calculate BMI.  Orders:  No orders of the defined types were placed in this encounter.  No orders of the defined types were placed in this encounter.    Procedures: No procedures performed  Clinical Data: No additional findings.  ROS:  All other systems negative, except as noted in the HPI. Review of Systems  Objective: Vital Signs: There were no vitals taken for this  visit.  Specialty Comments:  No specialty comments available.  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 (HCC) 12/15/2023   Medication management 08/28/2023   Cellulitis 07/15/2023   Peripheral arterial disease 07/15/2023   Atrial fibrillation, chronic (HCC) 07/15/2023   Dyslipidemia 07/15/2023   BPH (benign prostatic hyperplasia) 07/15/2023   HTN (hypertension) 07/15/2023   DM (diabetes mellitus) (HCC) 07/15/2023   Bacteremia 06/25/2023   Sepsis due to undetermined organism (HCC) 06/21/2023   Cellulitis of right lower leg 06/21/2023   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) 06/20/2023   Streptococcal bacteremia 06/18/2023   Cellulitis of right lower extremity 06/17/2023   Persistent atrial fibrillation (HCC) 09/23/2021   Hypercoagulable state due to persistent atrial fibrillation (HCC) 09/23/2021   Callus 10/25/2019   Corns and callosities 10/25/2019   Hav (hallux abducto valgus), unspecified laterality 10/25/2019   Altered awareness, transient 06/23/2017   MALLET FINGER, RIGHT RING FINGER 10/19/2008   Past Medical History:  Diagnosis Date   A-fib (HCC)    B12 deficiency    Diverticulosis    Fatigue    History of colonic polyps    Hyperlipemia    Hypertension    Peripheral vascular disease    Pernicious anemia    Prostate cancer (HCC)    Syncope    Syncope     Family History  Problem Relation Age of Onset   Stroke Neg Hx    Transient ischemic attack Neg Hx     Past Surgical History:  Procedure Laterality Date   ABDOMINAL AORTOGRAM N/A 07/20/2023   Procedure: ABDOMINAL AORTOGRAM;  Surgeon: Serene Gaile ORN, MD;  [facility];  Service: Cardiovascular;  Laterality: N/A;   ABDOMINAL AORTOGRAM W/LOWER EXTREMITY N/A 05/17/2023   Procedure: ABDOMINAL AORTOGRAM W/LOWER EXTREMITY;  Surgeon: Pearline Norman RAMAN, MD;  Location: [facility]  INVASIVE CV LAB;  Service: Cardiovascular;  Laterality: N/A;   ABDOMINAL AORTOGRAM W/LOWER EXTREMITY N/A 10/27/2023   Procedure: ABDOMINAL AORTOGRAM W/LOWER EXTREMITY;  Surgeon: Lanis Fonda BRAVO, MD;  Location: [facility] INVASIVE CV LAB;  Service: Cardiovascular;  Laterality: N/A;   APPENDECTOMY     APPLICATION OF WOUND VAC Bilateral 12/15/2023   Procedure: APPLICATION, WOUND VAC;  Surgeon: Harden Jerona GAILS, MD;  Location[facility];  Service: Orthopedics;  Laterality:  Bilateral;   CARDIOVERSION N/A 10/07/2021   Procedure: CARDIOVERSION;  Surgeon: Sheena Pugh, DO;  [facility];  Service: Cardiovascular;  Laterality: N/A;   INCISION AND DRAINAGE OF DEEP ABSCESS, CALF Bilateral 12/15/2023   Procedure: BILATERAL LEG INCISION AND DRAINAGE OF DEEP ABSCESS;  Surgeon: Harden Jerona GAILS, MD;  Location: [facility];  Service: Orthopedics;  Laterality: Bilateral;   IR ANGIO INTRA EXTRACRAN SEL COM CAROTID INNOMINATE BILAT MOD SED  09/10/2017   IR ANGIO VERTEBRAL SEL SUBCLAVIAN INNOMINATE BILAT MOD SED  09/10/2017   LOWER EXTREMITY ANGIOGRAPHY N/A 07/20/2023   Procedure: Lower Extremity Angiography;  Surgeon: Serene Gaile ORN, MD;  [facility];  Service: Cardiovascular;  Laterality: N/A;   LOWER EXTREMITY INTERVENTION Left 07/20/2023   Procedure: LOWER EXTREMITY INTERVENTION;  Surgeon: Serene Gaile ORN, MD;  [facility];  Service: Cardiovascular;  Laterality: Left;   PERIPHERAL INTRAVASCULAR LITHOTRIPSY Right 05/17/2023   Procedure: PERIPHERAL INTRAVASCULAR LITHOTRIPSY;  Surgeon: Pearline Norman RAMAN, MD;  Location: [facility] INVASIVE CV LAB;  Service: Cardiovascular;  Laterality: Right;  SFA-POP   PERIPHERAL INTRAVASCULAR LITHOTRIPSY Left 07/20/2023   Procedure: PERIPHERAL INTRAVASCULAR LITHOTRIPSY;  Surgeon: Serene Gaile ORN, MD;  [facility];  Service: Cardiovascular;  Laterality: Left;   PERIPHERAL VASCULAR BALLOON ANGIOPLASTY Right 05/17/2023   Procedure: PERIPHERAL  VASCULAR BALLOON ANGIOPLASTY;  Surgeon: Pearline Norman RAMAN, MD;  [facility];  Service: Cardiovascular;  Laterality: Right;  SFA-POP   TONSILLECTOMY     Social History   Occupational History   Not on file  Tobacco Use   Smoking status: Never   Smokeless tobacco: Never   Tobacco comments:    Never smoke 09/23/21  Vaping Use   Vaping status: Never Used  Substance and Sexual Activity   Alcohol  use: Yes    Alcohol /week: 2.0 standard drinks of alcohol     Types: 1 Cans of beer, 1 Shots of liquor per week    Comment: occasionally   Drug use: No   Sexual activity: Not on file

## 2024-01-27 NOTE — Progress Notes (Unsigned)
 Patient ID: Logan French, male   DOB: 09-27-1934, 89 y.o.   MRN: [id number]  Reason for Consult: No chief complaint on file.   Referred by Yolande Toribio MATSU, MD  Subjective:     HPI [name] is a 89 y.o. male presenting for follow-up of PAD.  Previously underwent right popliteal shockwave lithotripsy and drug-coated balloon angioplasty on 05/17/2023 then subsequently shockwave lithotripsy of the left popliteal 07/20/2023. ***  Past Medical History:  Diagnosis Date   A-fib (HCC)    B12 deficiency    Diverticulosis    Fatigue    History of colonic polyps    Hyperlipemia    Hypertension    Peripheral vascular disease    Pernicious anemia    Prostate cancer (HCC)    Syncope    Syncope    Family History  Problem Relation Age of Onset   Stroke Neg Hx    Transient ischemic attack Neg Hx    Past Surgical History:  Procedure Laterality Date   ABDOMINAL AORTOGRAM N/A 07/20/2023   Procedure: ABDOMINAL AORTOGRAM;  Surgeon: Serene Gaile ORN, MD;  [facility];  Service: Cardiovascular;  Laterality: N/A;   ABDOMINAL AORTOGRAM W/LOWER EXTREMITY N/A 05/17/2023   Procedure: ABDOMINAL AORTOGRAM W/LOWER EXTREMITY;  Surgeon: Pearline Norman RAMAN, MD;  Location: [facility] INVASIVE CV LAB;  Service: Cardiovascular;  Laterality: N/A;   ABDOMINAL AORTOGRAM W/LOWER EXTREMITY N/A 10/27/2023   Procedure: ABDOMINAL AORTOGRAM W/LOWER EXTREMITY;  Surgeon: Lanis Fonda FORBES, MD;  Location: [facility] INVASIVE CV LAB;  Service: Cardiovascular;  Laterality: N/A;   APPENDECTOMY     APPLICATION OF WOUND VAC Bilateral 12/15/2023   Procedure: APPLICATION, WOUND VAC;  Surgeon: Harden Jerona GAILS, MD;  [facility];  Service: Orthopedics;  Laterality: Bilateral;   CARDIOVERSION N/A 10/07/2021   Procedure: CARDIOVERSION;  Surgeon: Sheena Pugh, DO;  [facility];  Service: Cardiovascular;  Laterality: N/A;   INCISION AND DRAINAGE OF DEEP ABSCESS, CALF Bilateral 12/15/2023   Procedure:  BILATERAL LEG INCISION AND DRAINAGE OF DEEP ABSCESS;  Surgeon: Harden Jerona GAILS, MD;  Location[facility];  Service: Orthopedics;  Laterality: Bilateral;   IR ANGIO INTRA EXTRACRAN SEL COM CAROTID INNOMINATE BILAT MOD SED  09/10/2017   IR ANGIO VERTEBRAL SEL SUBCLAVIAN INNOMINATE BILAT MOD SED  09/10/2017   LOWER EXTREMITY ANGIOGRAPHY N/A 07/20/2023   Procedure: Lower Extremity Angiography;  Surgeon: Serene Gaile ORN, MD;  [facility];  Service: Cardiovascular;  Laterality: N/A;   LOWER EXTREMITY INTERVENTION Left 07/20/2023   Procedure: LOWER EXTREMITY INTERVENTION;  Surgeon: Serene Gaile ORN, MD;  [facility];  Service: Cardiovascular;  Laterality: Left;   PERIPHERAL INTRAVASCULAR LITHOTRIPSY Right 05/17/2023   Procedure: PERIPHERAL INTRAVASCULAR LITHOTRIPSY;  Surgeon: Pearline Norman RAMAN, MD;  Location: [facility] INVASIVE CV LAB;  Service: Cardiovascular;  Laterality: Right;  SFA-POP   PERIPHERAL INTRAVASCULAR LITHOTRIPSY Left 07/20/2023   Procedure: PERIPHERAL INTRAVASCULAR LITHOTRIPSY;  Surgeon: Serene Gaile ORN, MD;  [facility];  Service: Cardiovascular;  Laterality: Left;   PERIPHERAL VASCULAR BALLOON ANGIOPLASTY Right 05/17/2023   Procedure: PERIPHERAL VASCULAR BALLOON ANGIOPLASTY;  Surgeon: Pearline Norman RAMAN, MD;  [facility];  Service: Cardiovascular;  Laterality: Right;  SFA-POP   TONSILLECTOMY      Short Social History:  Social History   Tobacco Use   Smoking status: Never   Smokeless tobacco: Never   Tobacco comments:    Never smoke 09/23/21  Substance Use  Topics   Alcohol  use: Yes    Alcohol /week: 2.0 standard drinks of alcohol     Types: 1 Cans of beer, 1 Shots of liquor per week    Comment: occasionally    No Known Allergies  Current Outpatient Medications  Medication Sig Dispense Refill   acetaminophen  (TYLENOL ) 500 MG tablet Take 1,000 mg by mouth every 6 (six) hours as needed for moderate pain (pain score 4-6).      clopidogrel  (PLAVIX ) 75 MG tablet Take 1 tablet (75 mg total) by mouth daily. 30 tablet 0   colchicine  0.6 MG tablet Take 1 tablet (0.6 mg total) by mouth daily. 30 tablet 0   cyanocobalamin  (VITAMIN B12) 1000 MCG tablet Take 0.5 tablets (500 mcg total) by mouth daily. 15 tablet 0   ezetimibe  (ZETIA ) 10 MG tablet Take 1 tablet (10 mg total) by mouth daily. 30 tablet 0   ferrous sulfate 325 (65 FE) MG EC tablet Take 325 mg by mouth 3 (three) times a week.     finasteride  (PROSCAR ) 5 MG tablet Take 1 tablet (5 mg total) by mouth daily. 30 tablet 0   furosemide  (LASIX ) 20 MG tablet Take 20 mg by mouth daily as needed for fluid or edema.     hydrochlorothiazide  (MICROZIDE ) 12.5 MG capsule Take 1 capsule (12.5 mg total) by mouth daily. 90 capsule 3   metFORMIN  (GLUCOPHAGE ) 500 MG tablet Take 1 tablet (500 mg total) by mouth daily with breakfast. 30 tablet 0   metoprolol  succinate (TOPROL -XL) 25 MG 24 hr tablet Take 0.5 tablets (12.5 mg total) by mouth every 12 (twelve) hours. 30 tablet 0   oxyCODONE  (OXY IR/ROXICODONE ) 5 MG immediate release tablet Take 1 tablet (5 mg total) by mouth every 6 (six) hours as needed for severe pain (pain score 7-10). 30 tablet 0   pantoprazole  (PROTONIX ) 40 MG tablet Take 1 tablet (40 mg total) by mouth daily. 30 tablet 0   potassium chloride  SA (KLOR-CON  M) 20 MEQ tablet Take 20 mEq by mouth daily as needed (Fluid).     rosuvastatin  (CRESTOR ) 20 MG tablet Take 1 tablet (20 mg total) by mouth daily. 30 tablet 8   rosuvastatin  (CRESTOR ) 20 MG tablet TAKE ONE TABLET DAILY 90 tablet 3   STUDY - LIBREXIA-AF - apixaban  5 mg or placebo capsule (PI-Sethi) Take 1 capsule (5 mg total) by mouth 2 (two) times daily. Take at approximately the same time of day with or without food. Please bring bottle back with you to every visit; do not discard bottle. For Investigational Use Only. 420 capsule 0   STUDY - LIBREXIA-AF - [id number] (Milvexian) 100 mg or placebo tablet (PI-Sethi) Take  1 tablet by mouth 2 (two) times daily. Take at approximately the same time of day with or without food. Please bring bottle back with you to every visit; do not discard bottle. For Investigational Use Only. 420 tablet 0   vitamin D3 (CHOLECALCIFEROL ) 25 MCG tablet Take 1 tablet (1,000 Units total) by mouth daily. 30 tablet 0   No current facility-administered medications for this visit.    REVIEW OF SYSTEMS  All other systems were reviewed and are negative     Objective:  Objective   There were no vitals filed for this visit. There is no height or weight on file to calculate BMI.  Physical Exam General: no acute distress Cardiac: hemodynamically stable Abdomen: non-tender, no pulsatile mass*** Extremities: no edema, cyanosis or wounds*** Vascular:   Right: ***  Left: ***  Data: ABI ***  Duplex ***     Assessment/Plan:   Rafiq Bucklin is a 89 y.o. male with PAD.  Previously underwent right popliteal shockwave lithotripsy and drug-coated balloon angioplasty on 05/17/2023 then subsequently shockwave lithotripsy of the left popliteal 07/20/2023. ***   Norman GORMAN Serve MD [facility]

## 2024-01-28 ENCOUNTER — Ambulatory Visit: Admitting: Vascular Surgery

## 2024-01-28 ENCOUNTER — Ambulatory Visit (HOSPITAL_COMMUNITY)
Admission: RE | Admit: 2024-01-28 | Discharge: 2024-01-28 | Disposition: A | Discharge status: Home / Self Care | Source: Ambulatory Visit | Attending: Surgery | Admitting: Surgery

## 2024-01-28 ENCOUNTER — Encounter: Payer: Self-pay | Admitting: Vascular Surgery

## 2024-01-28 VITALS — BP 127/73 | HR 60 | Temp 97.9°F | Ht 72.0 in | Wt 196.0 lb

## 2024-01-28 DIAGNOSIS — I70221 Atherosclerosis of native arteries of extremities with rest pain, right leg: Secondary | ICD-10-CM

## 2024-01-28 DIAGNOSIS — I739 Peripheral vascular disease, unspecified: Secondary | ICD-10-CM

## 2024-01-28 DIAGNOSIS — I70222 Atherosclerosis of native arteries of extremities with rest pain, left leg: Secondary | ICD-10-CM

## 2024-01-28 LAB — VAS US ABI WITH/WO TBI
Left ABI: 1.04
Right ABI: 1

## 2024-01-31 ENCOUNTER — Other Ambulatory Visit: Payer: Self-pay

## 2024-01-31 DIAGNOSIS — I739 Peripheral vascular disease, unspecified: Secondary | ICD-10-CM

## 2024-02-01 ENCOUNTER — Ambulatory Visit: Admitting: Physician Assistant

## 2024-02-01 ENCOUNTER — Encounter: Payer: Self-pay | Admitting: Physician Assistant

## 2024-02-01 DIAGNOSIS — I890 Lymphedema, not elsewhere classified: Secondary | ICD-10-CM

## 2024-02-01 DIAGNOSIS — L97521 Non-pressure chronic ulcer of other part of left foot limited to breakdown of skin: Secondary | ICD-10-CM | POA: Diagnosis not present

## 2024-02-01 DIAGNOSIS — I87331 Chronic venous hypertension (idiopathic) with ulcer and inflammation of right lower extremity: Secondary | ICD-10-CM

## 2024-02-01 NOTE — Progress Notes (Signed)
 Office Visit Note   Patient: Logan French           Date of Birth: April 10, 1934           MRN: [id number] Visit Date: 02/01/2024              Requested by: Yolande Toribio MATSU, MD [address] [place],  [place] [address] PCP: Yolande Toribio MATSU, MD  Chief Complaint  Patient presents with   Left Leg - Routine Post Op    12/15/2023 BLE I&D wounds   Right Leg - Routine Post Op      HPI: 89 y/o male with chronic venous ulcers B LE that have failed conservative treatment. He is s/p excisional debridement of deep abscess bilateral lower extremities. He was seen by [facility] the arterial studies are stable and he will follow up in 3 months.    Assessment & Plan: Visit Diagnoses:  1. Chronic venous hypertension (idiopathic) with ulcer and inflammation of right lower extremity (HCC)   2. Lymphedema   3. Non-pressure chronic ulcer of other part of left foot limited to breakdown of skin (HCC)     Plan: Bilateral Proform dressings applied with adaptic over the wound beds to prevent sticking.    Follow-Up Instructions: Return in about 1 week (around 02/08/2024).   Ortho Exam  Patient is alert, oriented, no adenopathy, well-dressed, normal affect, normal respiratory effort. With verbal consent a 10 blade was used to remove yellow fibrinous tissue from the wound beds.    The right LE medial malleolus 1.7 cm x 1.7 cm, Achillis 3 cm x 1 cm, lateral leg 0.5 cm x 1 cm.  Medial leg wound 1 cm x 1 cm.   The left medial malleolus 2. cm x 2.5 cm, proximal posterior 4.5 cm x 5 cm, middle posterior 7 cm x 3 cm, distal achillis area 4 cm x 4 cm.   Improved edema B LE, skin appears to be healing well over all.   Imaging: No results found.       Labs: Lab Results  Component Value Date   HGBA1C 6.2 (H) 10/06/2023   HGBA1C 6.4 (H) 06/17/2023   CRP 26.9 (H) 06/23/2023   CRP 27.5 (H) 06/22/2023   CRP 27.6 (H) 06/21/2023   LABURIC 5.0 06/22/2023   LABURIC 5.3 06/21/2023   REPTSTATUS  12/20/2023 FINAL 12/15/2023   GRAMSTAIN  12/15/2023    RARE WBC PRESENT, PREDOMINANTLY PMN ABUNDANT GRAM POSITIVE COCCI RARE GRAM NEGATIVE RODS    CULT  12/15/2023    MODERATE PROTEUS MIRABILIS MODERATE PROVIDENCIA STUARTII FEW KLEBSIELLA OXYTOCA FEW PSEUDOMONAS AERUGINOSA MODERATE STAPHYLOCOCCUS AUREUS SUSCEPTIBILITIES PERFORMED ON PREVIOUS CULTURE WITHIN THE LAST 5 DAYS. NO ANAEROBES ISOLATED Performed at [facility], [address], [place], [place] [address]    LABORGA PROTEUS MIRABILIS 12/15/2023   LABORGA KLEBSIELLA OXYTOCA 12/15/2023   LABORGA PSEUDOMONAS AERUGINOSA 12/15/2023   LABORGA PROVIDENCIA STUARTII 12/15/2023     Lab Results  Component Value Date   ALBUMIN 2.7 (L) 12/15/2023   ALBUMIN 2.0 (L) 08/04/2023   ALBUMIN 2.2 (L) 08/02/2023    Lab Results  Component Value Date   MG 2.0 06/23/2023   MG 2.0 06/22/2023   MG 2.0 06/21/2023   Lab Results  Component Value Date   VD25OH 27.89 (L) 06/26/2023    No results found for: PREALBUMIN    Latest Ref Rng & Units 12/18/2023    9:26 AM 12/15/2023    7:28 AM 10/27/2023  7:08 AM  CBC EXTENDED  WBC 4.0 - 10.5 K/uL  6.7    RBC 4.22 - 5.81 MIL/uL  5.14    Hemoglobin 13.0 - 17.0 g/dL 88.8  88.1  88.3   HCT 39.0 - 52.0 % 36.8  39.9  34.0   Platelets 150 - 400 K/uL  267    NEUT# 1.7 - 7.7 K/uL  3.9    Lymph# 0.7 - 4.0 K/uL  1.6       There is no height or weight on file to calculate BMI.  Orders:  No orders of the defined types were placed in this encounter.  No orders of the defined types were placed in this encounter.    Procedures: No procedures performed  Clinical Data: No additional findings.  ROS:  All other systems negative, except as noted in the HPI. Review of Systems  Objective: Vital Signs: There were no vitals taken for this visit.  Specialty Comments:  No specialty comments available.  PMFS History: Patient Active Problem List   Diagnosis Date Noted   Chronic  venous htn w ulcer and inflam of bilateral low extrm (HCC) 12/15/2023   Medication management 08/28/2023   Cellulitis 07/15/2023   Peripheral arterial disease 07/15/2023   Atrial fibrillation, chronic (HCC) 07/15/2023   Dyslipidemia 07/15/2023   BPH (benign prostatic hyperplasia) 07/15/2023   HTN (hypertension) 07/15/2023   DM (diabetes mellitus) (HCC) 07/15/2023   Bacteremia 06/25/2023   Sepsis due to undetermined organism (HCC) 06/21/2023   Cellulitis of right lower leg 06/21/2023   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) 06/20/2023   Streptococcal bacteremia 06/18/2023   Cellulitis of right lower extremity 06/17/2023   Persistent atrial fibrillation (HCC) 09/23/2021   Hypercoagulable state due to persistent atrial fibrillation (HCC) 09/23/2021   Callus 10/25/2019   Corns and callosities 10/25/2019   Hav (hallux abducto valgus), unspecified laterality 10/25/2019   Altered awareness, transient 06/23/2017   MALLET FINGER, RIGHT RING FINGER 10/19/2008   Past Medical History:  Diagnosis Date   A-fib (HCC)    B12 deficiency    Diverticulosis    Fatigue    History of colonic polyps    Hyperlipemia    Hypertension    Peripheral vascular disease    Pernicious anemia    Prostate cancer (HCC)    Syncope    Syncope     Family History  Problem Relation Age of Onset   Stroke Neg Hx    Transient ischemic attack Neg Hx     Past Surgical History:  Procedure Laterality Date   ABDOMINAL AORTOGRAM N/A 07/20/2023   Procedure: ABDOMINAL AORTOGRAM;  Surgeon: Serene Gaile ORN, MD;  Location: [facility];  Service: Cardiovascular;  Laterality: N/A;   ABDOMINAL AORTOGRAM W/LOWER EXTREMITY N/A 05/17/2023   Procedure: ABDOMINAL AORTOGRAM W/LOWER EXTREMITY;  Surgeon: Pearline Norman RAMAN, MD;  Location: [facility] INVASIVE CV LAB;  Service: Cardiovascular;  Laterality: N/A;   ABDOMINAL AORTOGRAM W/LOWER EXTREMITY N/A 10/27/2023   Procedure: ABDOMINAL AORTOGRAM  W/LOWER EXTREMITY;  Surgeon: Lanis Fonda BRAVO, MD;  Location: [facility] INVASIVE CV LAB;  Service: Cardiovascular;  Laterality: N/A;   APPENDECTOMY     APPLICATION OF WOUND VAC Bilateral 12/15/2023   Procedure: APPLICATION, WOUND VAC;  Surgeon: Harden Jerona GAILS, MD;  Location: [facility] OR;  Service: Orthopedics;  Laterality: Bilateral;   CARDIOVERSION N/A 10/07/2021   Procedure: CARDIOVERSION;  Surgeon: Sheena Pugh, DO;  Location: [facility];  Service: Cardiovascular;  Laterality: N/A;  INCISION AND DRAINAGE OF DEEP ABSCESS, CALF Bilateral 12/15/2023   Procedure: BILATERAL LEG INCISION AND DRAINAGE OF DEEP ABSCESS;  Surgeon: Harden Jerona GAILS, MD;  Location: [facility] OR;  Service: Orthopedics;  Laterality: Bilateral;   IR ANGIO INTRA EXTRACRAN SEL COM CAROTID INNOMINATE BILAT MOD SED  09/10/2017   IR ANGIO VERTEBRAL SEL SUBCLAVIAN INNOMINATE BILAT MOD SED  09/10/2017   LOWER EXTREMITY ANGIOGRAPHY N/A 07/20/2023   Procedure: Lower Extremity Angiography;  Surgeon: Serene Gaile ORN, MD;  Location: [facility];  Service: Cardiovascular;  Laterality: N/A;   LOWER EXTREMITY INTERVENTION Left 07/20/2023   Procedure: LOWER EXTREMITY INTERVENTION;  Surgeon: Serene Gaile ORN, MD;  Location: [facility];  Service: Cardiovascular;  Laterality: Left;   PERIPHERAL INTRAVASCULAR LITHOTRIPSY Right 05/17/2023   Procedure: PERIPHERAL INTRAVASCULAR LITHOTRIPSY;  Surgeon: Pearline Norman RAMAN, MD;  Location: [facility] INVASIVE CV LAB;  Service: Cardiovascular;  Laterality: Right;  SFA-POP   PERIPHERAL INTRAVASCULAR LITHOTRIPSY Left 07/20/2023   Procedure: PERIPHERAL INTRAVASCULAR LITHOTRIPSY;  Surgeon: Serene Gaile ORN, MD;  [facility]: [facility];  Service: Cardiovascular;  Laterality: Left;   PERIPHERAL VASCULAR BALLOON ANGIOPLASTY Right 05/17/2023   Procedure: PERIPHERAL VASCULAR BALLOON ANGIOPLASTY;  Surgeon: Pearline Norman RAMAN, MD;  Location: [facility];  Service: Cardiovascular;  Laterality: Right;  SFA-POP    TONSILLECTOMY     Social History   Occupational History   Not on file  Tobacco Use   Smoking status: Never   Smokeless tobacco: Never   Tobacco comments:    Never smoke 09/23/21  Vaping Use   Vaping status: Never Used  Substance and Sexual Activity   Alcohol  use: Yes    Alcohol /week: 2.0 standard drinks of alcohol     Types: 1 Cans of beer, 1 Shots of liquor per week    Comment: occasionally   Drug use: No   Sexual activity: Not on file

## 2024-02-08 ENCOUNTER — Encounter: Payer: Self-pay | Admitting: Physician Assistant

## 2024-02-08 ENCOUNTER — Ambulatory Visit: Admitting: Physician Assistant

## 2024-02-08 DIAGNOSIS — I87331 Chronic venous hypertension (idiopathic) with ulcer and inflammation of right lower extremity: Secondary | ICD-10-CM | POA: Diagnosis not present

## 2024-02-08 DIAGNOSIS — L97521 Non-pressure chronic ulcer of other part of left foot limited to breakdown of skin: Secondary | ICD-10-CM | POA: Diagnosis not present

## 2024-02-08 DIAGNOSIS — I87319 Chronic venous hypertension (idiopathic) with ulcer of unspecified lower extremity: Secondary | ICD-10-CM

## 2024-02-08 MED ORDER — OXYCODONE HCL 5 MG PO CAPS: 5.0000 mg | ORAL_CAPSULE | ORAL | 0 refills | Status: AC | PRN

## 2024-02-08 NOTE — Progress Notes (Signed)
 Office Visit Note   Patient: Logan French           Date of Birth: [birth year]           MRN: [id number] Visit Date: 02/08/2024              Requested by: Yolande Toribio MATSU, MD [address] [place],  [place] [address] PCP: Yolande Toribio MATSU, MD  Chief Complaint  Patient presents with   Right Leg - Routine Post Op    12/15/2023 BLE I&D wounds   Left Leg - Routine Post Op      HPI: 89 y/o male with chronic venous ulcers B LE that have failed conservative treatment. He is s/p excisional debridement of deep abscess bilateral lower extremities.   He was seen by [facility] on 01/28/24.  Dr. Pearline was pleased with his ABI's.  his ABI is 1.0 bilaterally despite his single-vessel runoff.  He is not elevating as much as he was.    Assessment & Plan: Visit Diagnoses:  1. Chronic venous hypertension (idiopathic) with ulcer and inflammation of right lower extremity (HCC)   2. Non-pressure chronic ulcer of other part of left foot limited to breakdown of skin (HCC)   3. Venous ulcer of lower extremity due to chronic peripheral venous hypertension (HCC)     Plan: Bilateral Proform dressings applied with adaptic over the wound beds to prevent sticking.  Encouraged to elevate multiple times a day.  We will review the left posterior wounds for necrotic change son the next visit.    Follow-Up Instructions: Return in about 1 week (around 02/15/2024).   Ortho Exam  Patient is alert, oriented, no adenopathy, well-dressed, normal affect, normal respiratory effort. Left posterior proximal wound bed is 100 % granulation, however the skin edges are bruised or small area of hematoma, verse skin necrosis.  All the posterior wounds are healing.  The is fibrinous tissue over the distal left posterior wound that was debrided with Vashe and 4 x 4.   The Bilateral.   LE medial malleolus is tender to 4 x4 debridement, 30 % granulation and 70 % yellow fibrinous tissue.  He does not tolerate debridement  well over B medial malleolus wounds.    They showed me a picture of his right foot with cellulitis that has now dissipated due to dressing irritation.  They removed the dressing prior to coming into today and placed another dressing on.      Imaging: No results found.        Labs: Lab Results  Component Value Date   HGBA1C 6.2 (H) 10/06/2023   HGBA1C 6.4 (H) 06/17/2023   CRP 26.9 (H) 06/23/2023   CRP 27.5 (H) 06/22/2023   CRP 27.6 (H) 06/21/2023   LABURIC 5.0 06/22/2023   LABURIC 5.3 06/21/2023   REPTSTATUS 12/20/2023 FINAL 12/15/2023   GRAMSTAIN  12/15/2023    RARE WBC PRESENT, PREDOMINANTLY PMN ABUNDANT GRAM POSITIVE COCCI RARE GRAM NEGATIVE RODS    CULT  12/15/2023    MODERATE PROTEUS MIRABILIS MODERATE PROVIDENCIA STUARTII FEW KLEBSIELLA OXYTOCA FEW PSEUDOMONAS AERUGINOSA MODERATE STAPHYLOCOCCUS AUREUS SUSCEPTIBILITIES PERFORMED ON PREVIOUS CULTURE WITHIN THE LAST 5 DAYS. NO ANAEROBES ISOLATED Performed at [facility], [address] [place], [place] [address]    LABORGA PROTEUS MIRABILIS 12/15/2023   LABORGA KLEBSIELLA OXYTOCA 12/15/2023   LABORGA PSEUDOMONAS AERUGINOSA 12/15/2023   LABORGA PROVIDENCIA STUARTII 12/15/2023     Lab Results  Component Value Date   ALBUMIN 2.7 (L) 12/15/2023  ALBUMIN 2.0 (L) 08/04/2023   ALBUMIN 2.2 (L) 08/02/2023    Lab Results  Component Value Date   MG 2.0 06/23/2023   MG 2.0 06/22/2023   MG 2.0 06/21/2023   Lab Results  Component Value Date   VD25OH 27.89 (L) 06/26/2023    No results found for: PREALBUMIN    Latest Ref Rng & Units 12/18/2023    9:26 AM 12/15/2023    7:28 AM 10/27/2023    7:08 AM  CBC EXTENDED  WBC 4.0 - 10.5 K/uL  6.7    RBC 4.22 - 5.81 MIL/uL  5.14    Hemoglobin 13.0 - 17.0 g/dL 88.8  88.1  88.3   HCT 39.0 - 52.0 % 36.8  39.9  34.0   Platelets 150 - 400 K/uL  267    NEUT# 1.7 - 7.7 K/uL  3.9    Lymph# 0.7 - 4.0 K/uL  1.6       There is no height or weight on file to  calculate BMI.  Orders:  No orders of the defined types were placed in this encounter.  Meds ordered this encounter  Medications   oxycodone  (OXY-IR) 5 MG capsule    Sig: Take 1 capsule (5 mg total) by mouth every 4 (four) hours as needed.    Dispense:  30 capsule    Refill:  0    Supervising Provider:   DUDA, MARCUS V [[id number]     Procedures: No procedures performed  Clinical Data: No additional findings.  ROS:  All other systems negative, except as noted in the HPI. Review of Systems  Objective: Vital Signs: There were no vitals taken for this visit.  Specialty Comments:  No specialty comments available.  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 (HCC) 12/15/2023   Medication management 08/28/2023   Cellulitis 07/15/2023   Peripheral arterial disease 07/15/2023   Atrial fibrillation, chronic (HCC) 07/15/2023   Dyslipidemia 07/15/2023   BPH (benign prostatic hyperplasia) 07/15/2023   HTN (hypertension) 07/15/2023   DM (diabetes mellitus) (HCC) 07/15/2023   Bacteremia 06/25/2023   Sepsis due to undetermined organism (HCC) 06/21/2023   Cellulitis of right lower leg 06/21/2023   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) 06/20/2023   Streptococcal bacteremia 06/18/2023   Cellulitis of right lower extremity 06/17/2023   Persistent atrial fibrillation (HCC) 09/23/2021   Hypercoagulable state due to persistent atrial fibrillation (HCC) 09/23/2021   Callus 10/25/2019   Corns and callosities 10/25/2019   Hav (hallux abducto valgus), unspecified laterality 10/25/2019   Altered awareness, transient 06/23/2017   MALLET FINGER, RIGHT RING FINGER 10/19/2008   Past Medical History:  Diagnosis Date   A-fib (HCC)    B12 deficiency    Diverticulosis    Fatigue    History of colonic polyps    Hyperlipemia    Hypertension    Peripheral vascular disease     Pernicious anemia    Prostate cancer (HCC)    Syncope    Syncope     Family History  Problem Relation Age of Onset   Stroke Neg Hx    Transient ischemic attack Neg Hx     Past Surgical History:  Procedure Laterality Date   ABDOMINAL AORTOGRAM N/A 07/20/2023   Procedure: ABDOMINAL AORTOGRAM;  Surgeon: Serene Gaile ORN, MD;  Location: [facility];  Service: Cardiovascular;  Laterality: N/A;  ABDOMINAL AORTOGRAM W/LOWER EXTREMITY N/A 05/17/2023   Procedure: ABDOMINAL AORTOGRAM W/LOWER EXTREMITY;  Surgeon: Pearline Norman RAMAN, MD;  Location: [facility] INVASIVE CV LAB;  Service: Cardiovascular;  Laterality: N/A;   ABDOMINAL AORTOGRAM W/LOWER EXTREMITY N/A 10/27/2023   Procedure: ABDOMINAL AORTOGRAM W/LOWER EXTREMITY;  Surgeon: Lanis Fonda BRAVO, MD;  Location: [facility] INVASIVE CV LAB;  Service: Cardiovascular;  Laterality: N/A;   APPENDECTOMY     APPLICATION OF WOUND VAC Bilateral 12/15/2023   Procedure: APPLICATION, WOUND VAC;  Surgeon: Harden Jerona GAILS, MD;  Location: [facility];  Service: Orthopedics;  Laterality: Bilateral;   CARDIOVERSION N/A 10/07/2021   Procedure: CARDIOVERSION;  Surgeon: Sheena Pugh, DO;  Location: [facility];  Service: Cardiovascular;  Laterality: N/A;   INCISION AND DRAINAGE OF DEEP ABSCESS, CALF Bilateral 12/15/2023   Procedure: BILATERAL LEG INCISION AND DRAINAGE OF DEEP ABSCESS;  Surgeon: Harden Jerona GAILS, MD;  Location: [facility];  Service: Orthopedics;  Laterality: Bilateral;   IR ANGIO INTRA EXTRACRAN SEL COM CAROTID INNOMINATE BILAT MOD SED  09/10/2017   IR ANGIO VERTEBRAL SEL SUBCLAVIAN INNOMINATE BILAT MOD SED  09/10/2017   LOWER EXTREMITY ANGIOGRAPHY N/A 07/20/2023   Procedure: Lower Extremity Angiography;  Surgeon: Serene Gaile ORN, MD;  Location: [facility];  Service: Cardiovascular;  Laterality: N/A;   LOWER EXTREMITY INTERVENTION Left 07/20/2023   Procedure: LOWER EXTREMITY INTERVENTION;  Surgeon: Serene Gaile ORN, MD;  Location: [facility];  Service:  Cardiovascular;  Laterality: Left;   PERIPHERAL INTRAVASCULAR LITHOTRIPSY Right 05/17/2023   Procedure: PERIPHERAL INTRAVASCULAR LITHOTRIPSY;  Surgeon: Pearline Norman RAMAN, MD;  Location: [facility] INVASIVE CV LAB;  Service: Cardiovascular;  Laterality: Right;  SFA-POP   PERIPHERAL INTRAVASCULAR LITHOTRIPSY Left 07/20/2023   Procedure: PERIPHERAL INTRAVASCULAR LITHOTRIPSY;  Surgeon: Serene Gaile ORN, MD;  Location: [facility];  Service: Cardiovascular;  Laterality: Left;   PERIPHERAL VASCULAR BALLOON ANGIOPLASTY Right 05/17/2023   Procedure: PERIPHERAL VASCULAR BALLOON ANGIOPLASTY;  Surgeon: Pearline Norman RAMAN, MD;  Location: [facility];  Service: Cardiovascular;  Laterality: Right;  SFA-POP   TONSILLECTOMY     Social History   Occupational History   Not on file  Tobacco Use   Smoking status: Never   Smokeless tobacco: Never   Tobacco comments:    Never smoke 09/23/21  Vaping Use   Vaping status: Never Used  Substance and Sexual Activity   Alcohol  use: Yes    Alcohol /week: 2.0 standard drinks of alcohol     Types: 1 Cans of beer, 1 Shots of liquor per week    Comment: occasionally   Drug use: No   Sexual activity: Not on file

## 2024-02-15 ENCOUNTER — Ambulatory Visit: Admitting: Physician Assistant

## 2024-02-15 ENCOUNTER — Encounter: Payer: Self-pay | Admitting: Physician Assistant

## 2024-02-15 DIAGNOSIS — I890 Lymphedema, not elsewhere classified: Secondary | ICD-10-CM | POA: Diagnosis not present

## 2024-02-15 DIAGNOSIS — I87313 Chronic venous hypertension (idiopathic) with ulcer of bilateral lower extremity: Secondary | ICD-10-CM

## 2024-02-15 DIAGNOSIS — I87319 Chronic venous hypertension (idiopathic) with ulcer of unspecified lower extremity: Secondary | ICD-10-CM

## 2024-02-15 NOTE — Progress Notes (Signed)
 Office Visit Note   Patient: Logan French           Date of Birth: 1934-07-06           MRN: [id number] Visit Date: 02/15/2024              Requested by: Yolande Toribio MATSU, MD [address] [place],  [place] [address] PCP: Yolande Toribio MATSU, MD  Chief Complaint  Patient presents with   Right Leg - Routine Post Op    12/15/2023 BLE I&D wounds   Left Leg - Routine Post Op      HPI: 89 y/o male with chronic venous ulcers B LE that have failed conservative treatment. He is s/p excisional debridement of deep abscess bilateral lower extremities.              He was seen by Vascular on 01/28/24.  Dr. Pearline was pleased with his ABI's.  his ABI is 1.0 bilaterally despite his single-vessel runoff.  He is elevating with a hospital bed that is adjustable.  The dressing was removed yesterday at home to allow for skin cleaning.  He has circumferential small skin tears/blisters that appear that the dressing is sliding down with mobility.    Assessment & Plan: Visit Diagnoses: No diagnosis found.  Plan: He was placed back in Profor wraps for compression and he will continue to elevate his legs multiple times a day and restrict his mobility.    Follow-Up Instructions: No follow-ups on file.   Ortho Exam  Patient is alert, oriented, no adenopathy, well-dressed, normal affect, normal respiratory effort. Posterior right leg wounds have healed.  The right medial malleolus has a wound size of 2 cm x 1.5 cm.  The left LE  Medial malleolus wound measures 2.5 cm x 2 cm.  The posterior proximal wound measures 3.5 cm x 5 cm.  The left middle posterior wound measures 5 cm x 2 cm.  The distal achilles wound measures 3 cm x 2 cm.    After verbal consent the fibrinous wound beds were debrided with a 10 blade.  We have 50% granulation tissue on the wound beds.  He does not tolerate debridement well due to pain.     Imaging: No results found.            Labs: Lab Results  Component  Value Date   HGBA1C 6.2 (H) 10/06/2023   HGBA1C 6.4 (H) 06/17/2023   CRP 26.9 (H) 06/23/2023   CRP 27.5 (H) 06/22/2023   CRP 27.6 (H) 06/21/2023   LABURIC 5.0 06/22/2023   LABURIC 5.3 06/21/2023   REPTSTATUS 12/20/2023 FINAL 12/15/2023   GRAMSTAIN  12/15/2023    RARE WBC PRESENT, PREDOMINANTLY PMN ABUNDANT GRAM POSITIVE COCCI RARE GRAM NEGATIVE RODS    CULT  12/15/2023    MODERATE PROTEUS MIRABILIS MODERATE PROVIDENCIA STUARTII FEW KLEBSIELLA OXYTOCA FEW PSEUDOMONAS AERUGINOSA MODERATE STAPHYLOCOCCUS AUREUS SUSCEPTIBILITIES PERFORMED ON PREVIOUS CULTURE WITHIN THE LAST 5 DAYS. NO ANAEROBES ISOLATED Performed at [facility], [address], [place], [place] [address]    LABORGA PROTEUS MIRABILIS 12/15/2023   LABORGA KLEBSIELLA OXYTOCA 12/15/2023   LABORGA PSEUDOMONAS AERUGINOSA 12/15/2023   LABORGA PROVIDENCIA STUARTII 12/15/2023     Lab Results  Component Value Date   ALBUMIN 2.7 (L) 12/15/2023   ALBUMIN 2.0 (L) 08/04/2023   ALBUMIN 2.2 (L) 08/02/2023    Lab Results  Component Value Date   MG 2.0 06/23/2023   MG 2.0 06/22/2023   MG 2.0  06/21/2023   Lab Results  Component Value Date   VD25OH 27.89 (L) 06/26/2023    No results found for: PREALBUMIN    Latest Ref Rng & Units 12/18/2023    9:26 AM 12/15/2023    7:28 AM 10/27/2023    7:08 AM  CBC EXTENDED  WBC 4.0 - 10.5 K/uL  6.7    RBC 4.22 - 5.81 MIL/uL  5.14    Hemoglobin 13.0 - 17.0 g/dL 88.8  88.1  88.3   HCT 39.0 - 52.0 % 36.8  39.9  34.0   Platelets 150 - 400 K/uL  267    NEUT# 1.7 - 7.7 K/uL  3.9    Lymph# 0.7 - 4.0 K/uL  1.6       There is no height or weight on file to calculate BMI.  Orders:  No orders of the defined types were placed in this encounter.  No orders of the defined types were placed in this encounter.    Procedures: No procedures performed  Clinical Data: No additional findings.  ROS:  All other systems negative, except as noted in the HPI. Review of  Systems  Objective: Vital Signs: There were no vitals taken for this visit.  Specialty Comments:  No specialty comments available.  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 (HCC) 12/15/2023   Medication management 08/28/2023   Cellulitis 07/15/2023   Peripheral arterial disease 07/15/2023   Atrial fibrillation, chronic (HCC) 07/15/2023   Dyslipidemia 07/15/2023   BPH (benign prostatic hyperplasia) 07/15/2023   HTN (hypertension) 07/15/2023   DM (diabetes mellitus) (HCC) 07/15/2023   Bacteremia 06/25/2023   Sepsis due to undetermined organism (HCC) 06/21/2023   Cellulitis of right lower leg 06/21/2023   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) 06/20/2023   Streptococcal bacteremia 06/18/2023   Cellulitis of right lower extremity 06/17/2023   Persistent atrial fibrillation (HCC) 09/23/2021   Hypercoagulable state due to persistent atrial fibrillation (HCC) 09/23/2021   Callus 10/25/2019   Corns and callosities 10/25/2019   Hav (hallux abducto valgus), unspecified laterality 10/25/2019   Altered awareness, transient 06/23/2017   MALLET FINGER, RIGHT RING FINGER 10/19/2008   Past Medical History:  Diagnosis Date   A-fib (HCC)    B12 deficiency    Diverticulosis    Fatigue    History of colonic polyps    Hyperlipemia    Hypertension    Peripheral vascular disease    Pernicious anemia    Prostate cancer (HCC)    Syncope    Syncope     Family History  Problem Relation Age of Onset   Stroke Neg Hx    Transient ischemic attack Neg Hx     Past Surgical History:  Procedure Laterality Date   ABDOMINAL AORTOGRAM N/A 07/20/2023   Procedure: ABDOMINAL AORTOGRAM;  Surgeon: Serene Gaile ORN, MD;  Location: [facility] LAB;  Service: Cardiovascular;  Laterality: N/A;   ABDOMINAL AORTOGRAM W/LOWER EXTREMITY N/A 05/17/2023   Procedure: ABDOMINAL  AORTOGRAM W/LOWER EXTREMITY;  Surgeon: Pearline Norman RAMAN, MD;  Location: [facility] INVASIVE CV LAB;  Service: Cardiovascular;  Laterality: N/A;   ABDOMINAL AORTOGRAM W/LOWER EXTREMITY N/A 10/27/2023   Procedure: ABDOMINAL AORTOGRAM W/LOWER EXTREMITY;  Surgeon: Lanis Fonda BRAVO, MD;  Location: [facility] INVASIVE CV LAB;  Service: Cardiovascular;  Laterality: N/A;   APPENDECTOMY     APPLICATION OF WOUND VAC Bilateral 12/15/2023  Procedure: APPLICATION, WOUND VAC;  Surgeon: Harden Jerona GAILS, MD;  Location: [facility] OR;  Service: Orthopedics;  Laterality: Bilateral;   CARDIOVERSION N/A 10/07/2021   Procedure: CARDIOVERSION;  Surgeon: Sheena Pugh, DO;  Location: [facility];  Service: Cardiovascular;  Laterality: N/A;   INCISION AND DRAINAGE OF DEEP ABSCESS, CALF Bilateral 12/15/2023   Procedure: BILATERAL LEG INCISION AND DRAINAGE OF DEEP ABSCESS;  Surgeon: Harden Jerona GAILS, MD;  Location: [facility] OR;  Service: Orthopedics;  Laterality: Bilateral;   IR ANGIO INTRA EXTRACRAN SEL COM CAROTID INNOMINATE BILAT MOD SED  09/10/2017   IR ANGIO VERTEBRAL SEL SUBCLAVIAN INNOMINATE BILAT MOD SED  09/10/2017   LOWER EXTREMITY ANGIOGRAPHY N/A 07/20/2023   Procedure: Lower Extremity Angiography;  Surgeon: Serene Gaile ORN, MD;  Location: [facility];  Service: Cardiovascular;  Laterality: N/A;   LOWER EXTREMITY INTERVENTION Left 07/20/2023   Procedure: LOWER EXTREMITY INTERVENTION;  Surgeon: Serene Gaile ORN, MD;  Location: [facility];  Service: Cardiovascular;  Laterality: Left;   PERIPHERAL INTRAVASCULAR LITHOTRIPSY Right 05/17/2023   Procedure: PERIPHERAL INTRAVASCULAR LITHOTRIPSY;  Surgeon: Pearline Norman RAMAN, MD;  Location: [facility] INVASIVE CV LAB;  Service: Cardiovascular;  Laterality: Right;  SFA-POP   PERIPHERAL INTRAVASCULAR LITHOTRIPSY Left 07/20/2023   Procedure: PERIPHERAL INTRAVASCULAR LITHOTRIPSY;  Surgeon: Serene Gaile ORN, MD;  Location: [facility];  Service: Cardiovascular;  Laterality: Left;   PERIPHERAL  VASCULAR BALLOON ANGIOPLASTY Right 05/17/2023   Procedure: PERIPHERAL VASCULAR BALLOON ANGIOPLASTY;  Surgeon: Pearline Norman RAMAN, MD;  Location: [facility];  Service: Cardiovascular;  Laterality: Right;  SFA-POP   TONSILLECTOMY     Social History   Occupational History   Not on file  Tobacco Use   Smoking status: Never   Smokeless tobacco: Never   Tobacco comments:    Never smoke 09/23/21  Vaping Use   Vaping status: Never Used  Substance and Sexual Activity   Alcohol  use: Yes    Alcohol /week: 2.0 standard drinks of alcohol     Types: 1 Cans of beer, 1 Shots of liquor per week    Comment: occasionally   Drug use: No   Sexual activity: Not on file

## 2024-02-22 ENCOUNTER — Ambulatory Visit (INDEPENDENT_AMBULATORY_CARE_PROVIDER_SITE_OTHER): Admitting: Physician Assistant

## 2024-02-22 ENCOUNTER — Encounter: Payer: Self-pay | Admitting: Physician Assistant

## 2024-02-22 DIAGNOSIS — I87331 Chronic venous hypertension (idiopathic) with ulcer and inflammation of right lower extremity: Secondary | ICD-10-CM

## 2024-02-22 DIAGNOSIS — I87333 Chronic venous hypertension (idiopathic) with ulcer and inflammation of bilateral lower extremity: Secondary | ICD-10-CM

## 2024-02-22 DIAGNOSIS — I87319 Chronic venous hypertension (idiopathic) with ulcer of unspecified lower extremity: Secondary | ICD-10-CM

## 2024-02-22 NOTE — Progress Notes (Signed)
 Office Visit Note   Patient: Logan French           Date of Birth: 27-Sep-1934           MRN: [id number] Visit Date: 02/22/2024              Requested by: Yolande Toribio MATSU, MD [address] [place],  [place] [address] PCP: Yolande Toribio MATSU, MD  Chief Complaint  Patient presents with   Right Leg - Routine Post Op    12/15/2023 BLE I&D wounds   Left Leg - Routine Post Op      HPI: 89 y/o male with chronic venous ulcers B LE that have failed conservative treatment. He is s/p excisional debridement of deep abscess bilateral lower extremities.  He has more edema today, but the family states he is not elevating as much as he should.  They are taking the compression dressing down the day before and washing his legs.    Assessment & Plan: Visit Diagnoses:  1. 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 Chronic venous hypertension (idiopathic) with ulcer and inflammation of right lower extremity (HCC)     Plan: He is improving with decreased wound sizes.  We still need to continue elevation throughout the day alternating with mobility.  I gave him SLR exercises and ankle active motion exercises to do at home.    He was placed in compression wraps.  They will bring the compression socks on his next visit with plans to put him in Vive compression on the right LE.    Follow-Up Instructions: Return in about 1 week (around 02/29/2024).   Ortho Exam  Patient is alert, oriented, no adenopathy, well-dressed, normal affect, normal respiratory effort. Edema with shiny skin.  No cellulitis.  After verbal consent a 10 blade was used to debride the yellow fibrinous tissue cover the wounds.  Pin point bleeding occurred.    The posterior wounds on the right LE have healed.  The medial malleolus wound measures 2.5 cm 2.5 cm. 50 % granulation tissue was uncovered after debridement.    The left LE medial ankle wound is 1.5 x 2 cm , posterior wounds  proximal in 4.2 cm x 3.5 cm, the middle wound is 5 cm x 2 cm, the distal wound measures 2.5 x 2.5.                 Imaging: No results found.        Labs: Lab Results  Component Value Date   HGBA1C 6.2 (H) 10/06/2023   HGBA1C 6.4 (H) 06/17/2023   CRP 26.9 (H) 06/23/2023   CRP 27.5 (H) 06/22/2023   CRP 27.6 (H) 06/21/2023   LABURIC 5.0 06/22/2023   LABURIC 5.3 06/21/2023   REPTSTATUS 12/20/2023 FINAL 12/15/2023   GRAMSTAIN  12/15/2023    RARE WBC PRESENT, PREDOMINANTLY PMN ABUNDANT GRAM POSITIVE COCCI RARE GRAM NEGATIVE RODS    CULT  12/15/2023    MODERATE PROTEUS MIRABILIS MODERATE PROVIDENCIA STUARTII FEW KLEBSIELLA OXYTOCA FEW PSEUDOMONAS AERUGINOSA MODERATE STAPHYLOCOCCUS AUREUS SUSCEPTIBILITIES PERFORMED ON PREVIOUS CULTURE WITHIN THE LAST 5 DAYS. NO ANAEROBES ISOLATED Performed at [facility], [address], [place], [place] [address]    LABORGA PROTEUS MIRABILIS 12/15/2023   LABORGA KLEBSIELLA OXYTOCA 12/15/2023   LABORGA PSEUDOMONAS AERUGINOSA 12/15/2023   LABORGA PROVIDENCIA STUARTII 12/15/2023     Lab Results  Component Value Date   ALBUMIN 2.7 (L) 12/15/2023  ALBUMIN 2.0 (L) 08/04/2023   ALBUMIN 2.2 (L) 08/02/2023    Lab Results  Component Value Date   MG 2.0 06/23/2023   MG 2.0 06/22/2023   MG 2.0 06/21/2023   Lab Results  Component Value Date   VD25OH 27.89 (L) 06/26/2023    No results found for: PREALBUMIN    Latest Ref Rng & Units 12/18/2023    9:26 AM 12/15/2023    7:28 AM 10/27/2023    7:08 AM  CBC EXTENDED  WBC 4.0 - 10.5 K/uL  6.7    RBC 4.22 - 5.81 MIL/uL  5.14    Hemoglobin 13.0 - 17.0 g/dL 88.8  88.1  88.3   HCT 39.0 - 52.0 % 36.8  39.9  34.0   Platelets 150 - 400 K/uL  267    NEUT# 1.7 - 7.7 K/uL  3.9    Lymph# 0.7 - 4.0 K/uL  1.6       There is no height or weight on file to calculate BMI.  Orders:  No orders of the defined types were placed in this encounter.  No orders of the defined  types were placed in this encounter.    Procedures: No procedures performed  Clinical Data: No additional findings.  ROS:  All other systems negative, except as noted in the HPI. Review of Systems  Objective: Vital Signs: There were no vitals taken for this visit.  Specialty Comments:  No specialty comments available.  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 (HCC) 12/15/2023   Medication management 08/28/2023   Cellulitis 07/15/2023   Peripheral arterial disease 07/15/2023   Atrial fibrillation, chronic (HCC) 07/15/2023   Dyslipidemia 07/15/2023   BPH (benign prostatic hyperplasia) 07/15/2023   HTN (hypertension) 07/15/2023   DM (diabetes mellitus) (HCC) 07/15/2023   Bacteremia 06/25/2023   Sepsis due to undetermined organism (HCC) 06/21/2023   Cellulitis of right lower leg 06/21/2023   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) 06/20/2023   Streptococcal bacteremia 06/18/2023   Cellulitis of right lower extremity 06/17/2023   Persistent atrial fibrillation (HCC) 09/23/2021   Hypercoagulable state due to persistent atrial fibrillation (HCC) 09/23/2021   Callus 10/25/2019   Corns and callosities 10/25/2019   Hav (hallux abducto valgus), unspecified laterality 10/25/2019   Altered awareness, transient 06/23/2017   MALLET FINGER, RIGHT RING FINGER 10/19/2008   Past Medical History:  Diagnosis Date   A-fib (HCC)    B12 deficiency    Diverticulosis    Fatigue    History of colonic polyps    Hyperlipemia    Hypertension    Peripheral vascular disease    Pernicious anemia    Prostate cancer (HCC)    Syncope    Syncope     Family History  Problem Relation Age of Onset   Stroke Neg Hx    Transient ischemic attack Neg Hx     Past Surgical History:  Procedure Laterality Date   ABDOMINAL AORTOGRAM N/A 07/20/2023   Procedure: ABDOMINAL  AORTOGRAM;  Surgeon: Serene Gaile ORN, MD;  Location: [facility];  Service: Cardiovascular;  Laterality: N/A;   ABDOMINAL AORTOGRAM W/LOWER EXTREMITY N/A 05/17/2023   Procedure: ABDOMINAL AORTOGRAM W/LOWER EXTREMITY;  Surgeon: Pearline Norman RAMAN, MD;  Location: [facility] INVASIVE CV LAB;  Service: Cardiovascular;  Laterality: N/A;   ABDOMINAL AORTOGRAM W/LOWER EXTREMITY N/A 10/27/2023   Procedure: ABDOMINAL AORTOGRAM W/LOWER  EXTREMITY;  Surgeon: Lanis Fonda BRAVO, MD;  Location: [facility] INVASIVE CV LAB;  Service: Cardiovascular;  Laterality: N/A;   APPENDECTOMY     APPLICATION OF WOUND VAC Bilateral 12/15/2023   Procedure: APPLICATION, WOUND VAC;  Surgeon: Harden Jerona GAILS, MD;  Location: [facility] OR;  Service: Orthopedics;  Laterality: Bilateral;   CARDIOVERSION N/A 10/07/2021   Procedure: CARDIOVERSION;  Surgeon: Sheena Pugh, DO;  Location: [facility];  Service: Cardiovascular;  Laterality: N/A;   INCISION AND DRAINAGE OF DEEP ABSCESS, CALF Bilateral 12/15/2023   Procedure: BILATERAL LEG INCISION AND DRAINAGE OF DEEP ABSCESS;  Surgeon: Harden Jerona GAILS, MD;  Location: [facility] OR;  Service: Orthopedics;  Laterality: Bilateral;   IR ANGIO INTRA EXTRACRAN SEL COM CAROTID INNOMINATE BILAT MOD SED  09/10/2017   IR ANGIO VERTEBRAL SEL SUBCLAVIAN INNOMINATE BILAT MOD SED  09/10/2017   LOWER EXTREMITY ANGIOGRAPHY N/A 07/20/2023   Procedure: Lower Extremity Angiography;  Surgeon: Serene Gaile ORN, MD;  Location: [facility];  Service: Cardiovascular;  Laterality: N/A;   LOWER EXTREMITY INTERVENTION Left 07/20/2023   Procedure: LOWER EXTREMITY INTERVENTION;  Surgeon: Serene Gaile ORN, MD;  Location: [facility];  Service: Cardiovascular;  Laterality: Left;   PERIPHERAL INTRAVASCULAR LITHOTRIPSY Right 05/17/2023   Procedure: PERIPHERAL INTRAVASCULAR LITHOTRIPSY;  Surgeon: Pearline Norman RAMAN, MD;  Location: [facility] INVASIVE CV LAB;  Service: Cardiovascular;  Laterality: Right;  SFA-POP   PERIPHERAL INTRAVASCULAR  LITHOTRIPSY Left 07/20/2023   Procedure: PERIPHERAL INTRAVASCULAR LITHOTRIPSY;  Surgeon: Serene Gaile ORN, MD;  Location: [facility];  Service: Cardiovascular;  Laterality: Left;   PERIPHERAL VASCULAR BALLOON ANGIOPLASTY Right 05/17/2023   Procedure: PERIPHERAL VASCULAR BALLOON ANGIOPLASTY;  Surgeon: Pearline Norman RAMAN, MD;  Location: [facility];  Service: Cardiovascular;  Laterality: Right;  SFA-POP   TONSILLECTOMY     Social History   Occupational History   Not on file  Tobacco Use   Smoking status: Never   Smokeless tobacco: Never   Tobacco comments:    Never smoke 09/23/21  Vaping Use   Vaping status: Never Used  Substance and Sexual Activity   Alcohol  use: Yes    Alcohol /week: 2.0 standard drinks of alcohol     Types: 1 Cans of beer, 1 Shots of liquor per week    Comment: occasionally   Drug use: No   Sexual activity: Not on file

## 2024-02-29 ENCOUNTER — Ambulatory Visit: Admitting: Physician Assistant

## 2024-02-29 ENCOUNTER — Encounter: Payer: Self-pay | Admitting: Physician Assistant

## 2024-02-29 DIAGNOSIS — I87319 Chronic venous hypertension (idiopathic) with ulcer of unspecified lower extremity: Secondary | ICD-10-CM | POA: Diagnosis not present

## 2024-02-29 DIAGNOSIS — I890 Lymphedema, not elsewhere classified: Secondary | ICD-10-CM | POA: Diagnosis not present

## 2024-02-29 DIAGNOSIS — I87331 Chronic venous hypertension (idiopathic) with ulcer and inflammation of right lower extremity: Secondary | ICD-10-CM

## 2024-02-29 NOTE — Progress Notes (Signed)
 Office Visit Note   Patient: Logan French           Date of Birth: 04/03/1934           MRN: [id number] Visit Date: 02/29/2024              Requested by: Yolande Toribio MATSU, MD [address] [place],  [place] [address] PCP: Yolande Toribio MATSU, MD  Chief Complaint  Patient presents with   Left Leg - Follow-up   Right Leg - Follow-up      HPI: 89 y/o male with chronic venous ulcers B LE that have failed conservative treatment. He is s/p excisional debridement of deep abscess bilateral lower extremities.  He has more edema today, but the family states he is not elevating as much as he should.  They are taking the compression dressing down the day before and washing his legs.    Assessment & Plan: Visit Diagnoses:  1. 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 Lymphedema     Plan: The wounds are decreasing in size.  He is continuing to elevate multiple times a day. Plan for compression wraps today.  Our goal is to transition to compression socks.  Right now he and his wife are unable to don and doff the socks.  His care giver has had rotator cuff surgery.  They will bring the socks with them on the next visit.  I am hopeful that he can start wearing a compression sock on the right LE by his next visit.  Follow-Up Instructions: Return in about 1 week (around 03/07/2024).   Ortho Exam  Patient is alert, oriented, no adenopathy, well-dressed, normal affect, normal respiratory effort. Right medial malleolus measures 1.5 cm x 1.5 cm.  The posterior right achilles is fully healed once the superficial scab was removed.  There is anterior skin chafing from dressing change yesterday and the bandage moving.  No frank open wounds.   The left medial malleolus measures 1.8 x 1 cm.  The posterior wounds measures proximally 3 cm x 3.5 cm,  middle 5 cm x 1 cm and distal 2 cm x 2 cm.  After verbal consent I used a 10 blade to debride the yellow fibrinous tissue off the surface of the remaining wounds.  Pin point bleeding occurs and he has increased granulation tissue.               Imaging: No results found. No images are attached to the encounter.  Labs: Lab Results  Component Value Date   HGBA1C 6.2 (H) 10/06/2023   HGBA1C 6.4 (H) 06/17/2023   CRP 26.9 (H) 06/23/2023   CRP 27.5 (H) 06/22/2023   CRP 27.6 (H) 06/21/2023   LABURIC 5.0 06/22/2023   LABURIC 5.3 06/21/2023   REPTSTATUS 12/20/2023 FINAL 12/15/2023   GRAMSTAIN  12/15/2023    RARE WBC PRESENT, PREDOMINANTLY PMN ABUNDANT GRAM POSITIVE COCCI RARE GRAM NEGATIVE RODS    CULT  12/15/2023    MODERATE PROTEUS MIRABILIS MODERATE PROVIDENCIA STUARTII FEW KLEBSIELLA OXYTOCA FEW PSEUDOMONAS AERUGINOSA MODERATE STAPHYLOCOCCUS AUREUS SUSCEPTIBILITIES PERFORMED ON PREVIOUS CULTURE WITHIN THE LAST 5 DAYS. NO ANAEROBES ISOLATED Performed at [facility], [address] [place], [place] [address]    LABORGA PROTEUS MIRABILIS 12/15/2023   LABORGA KLEBSIELLA OXYTOCA 12/15/2023   LABORGA PSEUDOMONAS AERUGINOSA 12/15/2023   LABORGA PROVIDENCIA STUARTII  12/15/2023     Lab Results  Component Value Date   ALBUMIN 2.7 (L) 12/15/2023   ALBUMIN 2.0 (L) 08/04/2023   ALBUMIN 2.2 (L) 08/02/2023    Lab Results  Component Value Date   MG 2.0 06/23/2023   MG 2.0 06/22/2023   MG 2.0 06/21/2023   Lab Results  Component Value Date   VD25OH 27.89 (L) 06/26/2023    No results found for: PREALBUMIN    Latest Ref Rng & Units 12/18/2023    9:26 AM 12/15/2023    7:28 AM 10/27/2023    7:08 AM  CBC EXTENDED  WBC 4.0 - 10.5 K/uL  6.7    RBC 4.22 - 5.81 MIL/uL  5.14    Hemoglobin 13.0 - 17.0 g/dL 88.8  88.1  88.3   HCT 39.0 - 52.0 % 36.8  39.9  34.0   Platelets 150 - 400 K/uL  267    NEUT# 1.7 - 7.7 K/uL  3.9    Lymph# 0.7 - 4.0 K/uL  1.6        There is no height or weight on file to calculate BMI.  Orders:  No orders of the defined types were placed in this encounter.  No orders of the defined types were placed in this encounter.    Procedures: No procedures performed  Clinical Data: No additional findings.  ROS:  All other systems negative, except as noted in the HPI. Review of Systems  Objective: Vital Signs: There were no vitals taken for this visit.  Specialty Comments:  No specialty comments available.  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 (HCC) 12/15/2023   Medication management 08/28/2023   Cellulitis 07/15/2023   Peripheral arterial disease 07/15/2023   Atrial fibrillation, chronic (HCC) 07/15/2023   Dyslipidemia 07/15/2023   BPH (benign prostatic hyperplasia) 07/15/2023   HTN (hypertension) 07/15/2023   DM (diabetes mellitus) (HCC) 07/15/2023   Bacteremia 06/25/2023   Sepsis due to undetermined organism (HCC) 06/21/2023   Cellulitis of right lower leg 06/21/2023   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) 06/20/2023   Streptococcal bacteremia 06/18/2023   Cellulitis of right lower extremity 06/17/2023   Persistent atrial fibrillation (HCC) 09/23/2021   Hypercoagulable state due to persistent atrial fibrillation (HCC) 09/23/2021   Callus 10/25/2019   Corns and callosities 10/25/2019   Hav (hallux abducto valgus), unspecified laterality 10/25/2019   Altered awareness, transient 06/23/2017   MALLET FINGER, RIGHT RING FINGER 10/19/2008   Past Medical History:  Diagnosis Date   A-fib (HCC)    B12 deficiency    Diverticulosis    Fatigue    History of colonic polyps    Hyperlipemia    Hypertension    Peripheral vascular disease    Pernicious anemia    Prostate cancer (HCC)    Syncope    Syncope     Family History  Problem Relation Age of Onset   Stroke  Neg Hx    Transient ischemic attack Neg Hx     Past Surgical History:  Procedure Laterality Date   ABDOMINAL AORTOGRAM N/A 07/20/2023   Procedure: ABDOMINAL AORTOGRAM;  Surgeon: Serene Gaile ORN, MD;  Location: [facility] LAB;  Service: Cardiovascular;  Laterality: N/A;   ABDOMINAL AORTOGRAM W/LOWER EXTREMITY N/A 05/17/2023   Procedure: ABDOMINAL AORTOGRAM W/LOWER EXTREMITY;  Surgeon: Pearline Norman RAMAN, MD;  Location: [facility] INVASIVE CV LAB;  Service: Cardiovascular;  Laterality: N/A;   ABDOMINAL AORTOGRAM W/LOWER EXTREMITY N/A 10/27/2023   Procedure: ABDOMINAL AORTOGRAM W/LOWER EXTREMITY;  Surgeon: Lanis Fonda BRAVO, MD;  Location: [facility] INVASIVE CV LAB;  Service: Cardiovascular;  Laterality: N/A;   APPENDECTOMY     APPLICATION OF WOUND VAC Bilateral 12/15/2023   Procedure: APPLICATION, WOUND VAC;  Surgeon: Harden Jerona GAILS, MD;  Location: [facility] OR;  Service: Orthopedics;  Laterality: Bilateral;   CARDIOVERSION N/A 10/07/2021   Procedure: CARDIOVERSION;  Surgeon: Sheena Pugh, DO;  Location: [facility];  Service: Cardiovascular;  Laterality: N/A;   INCISION AND DRAINAGE OF DEEP ABSCESS, CALF Bilateral 12/15/2023   Procedure: BILATERAL LEG INCISION AND DRAINAGE OF DEEP ABSCESS;  Surgeon: Harden Jerona GAILS, MD;  Location: [facility] OR;  Service: Orthopedics;  Laterality: Bilateral;   IR ANGIO INTRA EXTRACRAN SEL COM CAROTID INNOMINATE BILAT MOD SED  09/10/2017   IR ANGIO VERTEBRAL SEL SUBCLAVIAN INNOMINATE BILAT MOD SED  09/10/2017   LOWER EXTREMITY ANGIOGRAPHY N/A 07/20/2023   Procedure: Lower Extremity Angiography;  Surgeon: Serene Gaile ORN, MD;  Location: [facility];  Service: Cardiovascular;  Laterality: N/A;   LOWER EXTREMITY INTERVENTION Left 07/20/2023   Procedure: LOWER EXTREMITY INTERVENTION;  Surgeon: Serene Gaile ORN, MD;  Location: [facility];  Service: Cardiovascular;  Laterality: Left;   PERIPHERAL INTRAVASCULAR LITHOTRIPSY Right 05/17/2023   Procedure: PERIPHERAL INTRAVASCULAR  LITHOTRIPSY;  Surgeon: Pearline Norman RAMAN, MD;  Location: [facility] INVASIVE CV LAB;  Service: Cardiovascular;  Laterality: Right;  SFA-POP   PERIPHERAL INTRAVASCULAR LITHOTRIPSY Left 07/20/2023   Procedure: PERIPHERAL INTRAVASCULAR LITHOTRIPSY;  Surgeon: Serene Gaile ORN, MD;  Location: [facility];  Service: Cardiovascular;  Laterality: Left;   PERIPHERAL VASCULAR BALLOON ANGIOPLASTY Right 05/17/2023   Procedure: PERIPHERAL VASCULAR BALLOON ANGIOPLASTY;  Surgeon: Pearline Norman RAMAN, MD;  Location: [facility];  Service: Cardiovascular;  Laterality: Right;  SFA-POP   TONSILLECTOMY     Social History   Occupational History   Not on file  Tobacco Use   Smoking status: Never   Smokeless tobacco: Never   Tobacco comments:    Never smoke 09/23/21  Vaping Use   Vaping status: Never Used  Substance and Sexual Activity   Alcohol  use: Yes    Alcohol /week: 2.0 standard drinks of alcohol     Types: 1 Cans of beer, 1 Shots of liquor per week    Comment: occasionally   Drug use: No   Sexual activity: Not on file

## 2024-03-06 ENCOUNTER — Encounter: Payer: Self-pay | Admitting: Orthopedic Surgery

## 2024-03-06 ENCOUNTER — Ambulatory Visit: Admitting: Orthopedic Surgery

## 2024-03-06 DIAGNOSIS — I87319 Chronic venous hypertension (idiopathic) with ulcer of unspecified lower extremity: Secondary | ICD-10-CM

## 2024-03-06 DIAGNOSIS — I87312 Chronic venous hypertension (idiopathic) with ulcer of left lower extremity: Secondary | ICD-10-CM | POA: Diagnosis not present

## 2024-03-06 DIAGNOSIS — I890 Lymphedema, not elsewhere classified: Secondary | ICD-10-CM

## 2024-03-06 DIAGNOSIS — I87331 Chronic venous hypertension (idiopathic) with ulcer and inflammation of right lower extremity: Secondary | ICD-10-CM

## 2024-03-06 NOTE — Progress Notes (Signed)
 "  Office Visit Note   Patient: Logan French           Date of Birth: 02/03/1935           MRN: [id number] Visit Date: 03/06/2024              Requested by: Yolande Toribio MATSU, MD [address] [place],  [place] [address] PCP: Yolande Toribio MATSU, MD  Chief Complaint  Patient presents with   Left Leg - Follow-up   Right Leg - Follow-up      HPI: Discussed the use of AI scribe software for clinical note transcription with the patient, who gave verbal consent to proceed.  History of Present Illness Logan French is an 89 year old male with chronic bilateral lower extremity ulcers who presents for follow-up of wound healing.  He has persistent chronic ulcers involving both lower extremities, with a large ulcer on the posterior aspect of the left ankle/Achilles region that has been slow to heal and was the initial source of his wound complications. He denies pain in the affected areas. There has been prior discussion regarding possible future debridement, though this has not been pursued.  He utilizes compression wraps and owns several pairs of medicated compression socks. There is ongoing discussion regarding the timing of transitioning from gauze wraps to medicated compression socks, particularly for the left leg. Family and caregivers assist with wound care, with concerns about the ability to apply and remove the tight compression socks and the potential for irritation from daily changes. His son may assist with sock changes when present.     Assessment & Plan: Visit Diagnoses:  1. 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 Lymphedema     Plan: Assessment and Plan Assessment & Plan Chronic ulcers of the left calf and ankle Chronic ulcer on the posterior left ankle with healthy granulation tissue and gradual improvement.  Swelling is the primary risk for delayed healing. No surgical debridement planned. - Applied Dynaflex wrap to the left leg for this week. - Instructed him to continue wrapping the left leg until after the holidays. - Recommended transition to medicated compression socks after the holidays, to be worn 24 hours a day except for bathing. - Advised daily changing and washing of compression socks if possible; every three days is acceptable if needed. - Emphasized importance of leg elevation, compression, exercise, and protein supplementation for optimal healing. - Scheduled follow-up appointment for December 30th to reassess and initiate use of compression socks.  Chronic ulcers of bilateral medial malleolus Ulcers of the bilateral medial malleolus are healing well. No acute concerns. Right posterior calf ulcers have resolved. - Applied Dynaflex wrap to both legs for this week per his preference. - Planned transition to compression socks for both legs after the holidays. - Scheduled follow-up appointment for December 30th to reassess healing and initiate compression socks.      Follow-Up Instructions: Return in about 1 week (around 03/13/2024).   Ortho Exam  Patient is alert, oriented, no adenopathy, well-dressed, normal affect, normal respiratory effort. Physical Exam EXTREMITIES: Flat, healthy granulation tissue in the posterior aspect of the left calf. Medial malleolar ulcers healing bilaterally. Posterior calf ulcers on the right healed well.      Imaging: No results found. No images are attached to the encounter.  Labs: Lab Results  Component Value Date   HGBA1C 6.2 (H) 10/06/2023   HGBA1C 6.4 (H) 06/17/2023   CRP 26.9 (H) 06/23/2023   CRP 27.5 (H) 06/22/2023   CRP 27.6 (H) 06/21/2023   LABURIC 5.0 06/22/2023   LABURIC 5.3 06/21/2023   REPTSTATUS 12/20/2023 FINAL 12/15/2023   GRAMSTAIN  12/15/2023    RARE WBC PRESENT, PREDOMINANTLY PMN ABUNDANT GRAM POSITIVE COCCI RARE  GRAM NEGATIVE RODS    CULT  12/15/2023    MODERATE PROTEUS MIRABILIS MODERATE PROVIDENCIA STUARTII FEW KLEBSIELLA OXYTOCA FEW PSEUDOMONAS AERUGINOSA MODERATE STAPHYLOCOCCUS AUREUS SUSCEPTIBILITIES PERFORMED ON PREVIOUS CULTURE WITHIN THE LAST 5 DAYS. NO ANAEROBES ISOLATED Performed at [facility], [address], [place], [place] [address]    LABORGA PROTEUS MIRABILIS 12/15/2023   LABORGA KLEBSIELLA OXYTOCA 12/15/2023   LABORGA PSEUDOMONAS AERUGINOSA 12/15/2023   LABORGA PROVIDENCIA STUARTII 12/15/2023     Lab Results  Component Value Date   ALBUMIN 2.7 (L) 12/15/2023   ALBUMIN 2.0 (L) 08/04/2023   ALBUMIN 2.2 (L) 08/02/2023    Lab Results  Component Value Date   MG 2.0 06/23/2023   MG 2.0 06/22/2023   MG 2.0 06/21/2023   Lab Results  Component Value Date   VD25OH 27.89 (L) 06/26/2023    No results found for: PREALBUMIN    Latest Ref Rng & Units 12/18/2023    9:26 AM 12/15/2023    7:28 AM 10/27/2023    7:08 AM  CBC EXTENDED  WBC 4.0 - 10.5 K/uL  6.7    RBC 4.22 - 5.81 MIL/uL  5.14    Hemoglobin 13.0 - 17.0 g/dL 88.8  88.1  88.3   HCT 39.0 - 52.0 % 36.8  39.9  34.0   Platelets 150 - 400 K/uL  267    NEUT# 1.7 - 7.7 K/uL  3.9    Lymph# 0.7 - 4.0 K/uL  1.6       There is no height or weight on file to calculate BMI.  Orders:  No orders of the defined types were placed in this encounter.  No orders of the defined types were placed in this encounter.    Procedures: No procedures performed  Clinical Data: No additional findings.  ROS:  All other systems negative, except as noted in the HPI. Review of Systems  Objective: Vital Signs: There were no vitals taken for this visit.  Specialty Comments:  No specialty comments available.  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 (HCC) 12/15/2023   Medication management 08/28/2023   Cellulitis 07/15/2023    Peripheral arterial disease 07/15/2023   Atrial fibrillation, chronic (HCC) 07/15/2023   Dyslipidemia 07/15/2023   BPH (benign prostatic hyperplasia) 07/15/2023   HTN (hypertension) 07/15/2023   DM (diabetes mellitus) (HCC) 07/15/2023   Bacteremia 06/25/2023   Sepsis due to undetermined organism (HCC) 06/21/2023   Cellulitis of right lower leg 06/21/2023   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) 06/20/2023   Streptococcal bacteremia 06/18/2023   Cellulitis of right lower extremity 06/17/2023   Persistent atrial fibrillation (HCC) 09/23/2021   Hypercoagulable state due to persistent atrial fibrillation (HCC) 09/23/2021   Callus 10/25/2019   Corns and callosities 10/25/2019   Hav (hallux abducto valgus), unspecified laterality 10/25/2019   Altered awareness, transient 06/23/2017   MALLET FINGER, RIGHT RING FINGER 10/19/2008   Past Medical History:  Diagnosis Date   A-fib (HCC)  B12 deficiency    Diverticulosis    Fatigue    History of colonic polyps    Hyperlipemia    Hypertension    Peripheral vascular disease    Pernicious anemia    Prostate cancer (HCC)    Syncope    Syncope     Family History  Problem Relation Age of Onset   Stroke Neg Hx    Transient ischemic attack Neg Hx     Past Surgical History:  Procedure Laterality Date   ABDOMINAL AORTOGRAM N/A 07/20/2023   Procedure: ABDOMINAL AORTOGRAM;  Surgeon: Serene Gaile ORN, MD;  Location: [facility];  Service: Cardiovascular;  Laterality: N/A;   ABDOMINAL AORTOGRAM W/LOWER EXTREMITY N/A 05/17/2023   Procedure: ABDOMINAL AORTOGRAM W/LOWER EXTREMITY;  Surgeon: Pearline Norman RAMAN, MD;  Location: [facility] INVASIVE CV LAB;  Service: Cardiovascular;  Laterality: N/A;   ABDOMINAL AORTOGRAM W/LOWER EXTREMITY N/A 10/27/2023   Procedure: ABDOMINAL AORTOGRAM W/LOWER EXTREMITY;  Surgeon: Lanis Fonda BRAVO, MD;  Location: [facility] INVASIVE CV LAB;  Service: Cardiovascular;  Laterality: N/A;    APPENDECTOMY     APPLICATION OF WOUND VAC Bilateral 12/15/2023   Procedure: APPLICATION, WOUND VAC;  Surgeon: Harden Jerona GAILS, MD;  Location: [facility];  Service: Orthopedics;  Laterality: Bilateral;   CARDIOVERSION N/A 10/07/2021   Procedure: CARDIOVERSION;  Surgeon: Sheena Pugh, DO;  Location: [facility];  Service: Cardiovascular;  Laterality: N/A;   INCISION AND DRAINAGE OF DEEP ABSCESS, CALF Bilateral 12/15/2023   Procedure: BILATERAL LEG INCISION AND DRAINAGE OF DEEP ABSCESS;  Surgeon: Harden Jerona GAILS, MD;  Location: [facility];  Service: Orthopedics;  Laterality: Bilateral;   IR ANGIO INTRA EXTRACRAN SEL COM CAROTID INNOMINATE BILAT MOD SED  09/10/2017   IR ANGIO VERTEBRAL SEL SUBCLAVIAN INNOMINATE BILAT MOD SED  09/10/2017   LOWER EXTREMITY ANGIOGRAPHY N/A 07/20/2023   Procedure: Lower Extremity Angiography;  Surgeon: Serene Gaile ORN, MD;  Location: [facility];  Service: Cardiovascular;  Laterality: N/A;   LOWER EXTREMITY INTERVENTION Left 07/20/2023   Procedure: LOWER EXTREMITY INTERVENTION;  Surgeon: Serene Gaile ORN, MD;  Location: [facility];  Service: Cardiovascular;  Laterality: Left;   PERIPHERAL INTRAVASCULAR LITHOTRIPSY Right 05/17/2023   Procedure: PERIPHERAL INTRAVASCULAR LITHOTRIPSY;  Surgeon: Pearline Norman RAMAN, MD;  Location: [facility] INVASIVE CV LAB;  Service: Cardiovascular;  Laterality: Right;  SFA-POP   PERIPHERAL INTRAVASCULAR LITHOTRIPSY Left 07/20/2023   Procedure: PERIPHERAL INTRAVASCULAR LITHOTRIPSY;  Surgeon: Serene Gaile ORN, MD;  Location: [facility];  Service: Cardiovascular;  Laterality: Left;   PERIPHERAL VASCULAR BALLOON ANGIOPLASTY Right 05/17/2023   Procedure: PERIPHERAL VASCULAR BALLOON ANGIOPLASTY;  Surgeon: Pearline Norman RAMAN, MD;  Location: [facility];  Service: Cardiovascular;  Laterality: Right;  SFA-POP   TONSILLECTOMY     Social History   Occupational History   Not on file  Tobacco Use   Smoking status: Never   Smokeless tobacco:  Never   Tobacco comments:    Never smoke 09/23/21  Vaping Use   Vaping status: Never Used  Substance and Sexual Activity   Alcohol  use: Yes    Alcohol /week: 2.0 standard drinks of alcohol     Types: 1 Cans of beer, 1 Shots of liquor per week    Comment: occasionally   Drug use: No   Sexual activity: Not on file         "

## 2024-03-14 ENCOUNTER — Encounter: Payer: Self-pay | Admitting: Physician Assistant

## 2024-03-14 ENCOUNTER — Ambulatory Visit (INDEPENDENT_AMBULATORY_CARE_PROVIDER_SITE_OTHER): Admitting: Physician Assistant

## 2024-03-14 DIAGNOSIS — I87331 Chronic venous hypertension (idiopathic) with ulcer and inflammation of right lower extremity: Secondary | ICD-10-CM

## 2024-03-14 DIAGNOSIS — I890 Lymphedema, not elsewhere classified: Secondary | ICD-10-CM

## 2024-03-14 DIAGNOSIS — L97521 Non-pressure chronic ulcer of other part of left foot limited to breakdown of skin: Secondary | ICD-10-CM

## 2024-03-14 NOTE — Progress Notes (Signed)
 "  Office Visit Note   Patient: Logan French           Date of Birth: Aug 01, 1934           MRN: [id number] Visit Date: 03/14/2024              Requested by: Yolande Toribio MATSU, MD [address] [place],  [place] [address] PCP: Yolande Toribio MATSU, MD  Chief Complaint  Patient presents with   Other    Follow up BLE      HPI: Logan French is an 89 year old male with chronic bilateral lower extremity ulcers who presents for follow-up of wound healing.   We have had progressive healing with the compression wraps and elevation since he has a hospital bed it has helped with the elevation positioning.  We are waiting until the wound are very superficial and almost healed until we transition to compression socks from wraps.  The right LE has almost healed to the point of transition.   Assessment & Plan: Visit Diagnoses:  1. Chronic venous hypertension (idiopathic) with ulcer and inflammation of right lower extremity (HCC)   2. Non-pressure chronic ulcer of other part of left foot limited to breakdown of skin (HCC)   3. Lymphedema     Plan: The wounds are decreasing in size.  He is continuing to elevate multiple times a day. Plan for compression wraps today.  Our goal is to transition to compression socks.  Right now he and his wife are unable to don and doff the socks.  His care giver has had rotator cuff surgery.  They will bring the socks with them on the next visit.  I am hopeful that he can start wearing a compression sock on the right LE by his next visit.  Follow-Up Instructions: Return in about 1 week (around 03/21/2024).   Ortho Exam  Patient is alert, oriented, no adenopathy, well-dressed, normal affect, normal respiratory effort. Right LE 0.5 cm x 1 cm posterior blood blister with proximal open area  , 1.5 cm x 2 cm medial malleolus.  Moderate edema in the right LE > left with shiny skin and clear weeping fluid drainage.  No cellulitis.  The left proximal wound 5 cm x  4.5 cm, middle 6 cm x 1 cm, and distal 2 cm x 3 cm.  I used silver nitrate on the middle and distal wound for hyper granulation.               Imaging: No results found. No images are attached to the encounter.  Labs: Lab Results  Component Value Date   HGBA1C 6.2 (H) 10/06/2023   HGBA1C 6.4 (H) 06/17/2023   CRP 26.9 (H) 06/23/2023   CRP 27.5 (H) 06/22/2023   CRP 27.6 (H) 06/21/2023   LABURIC 5.0 06/22/2023   LABURIC 5.3 06/21/2023   REPTSTATUS 12/20/2023 FINAL 12/15/2023   GRAMSTAIN  12/15/2023    RARE WBC PRESENT, PREDOMINANTLY PMN ABUNDANT GRAM POSITIVE COCCI RARE GRAM NEGATIVE RODS    CULT  12/15/2023    MODERATE PROTEUS MIRABILIS MODERATE PROVIDENCIA STUARTII FEW KLEBSIELLA OXYTOCA FEW PSEUDOMONAS AERUGINOSA MODERATE STAPHYLOCOCCUS AUREUS SUSCEPTIBILITIES PERFORMED ON PREVIOUS CULTURE WITHIN THE LAST 5 DAYS. NO ANAEROBES ISOLATED Performed at [facility], [address], [place], [place] [address]    LABORGA PROTEUS MIRABILIS 12/15/2023   LABORGA KLEBSIELLA OXYTOCA 12/15/2023   LABORGA PSEUDOMONAS AERUGINOSA 12/15/2023   LABORGA PROVIDENCIA STUARTII 12/15/2023     Lab Results  Component Value Date   ALBUMIN 2.7 (L) 12/15/2023   ALBUMIN 2.0 (L) 08/04/2023   ALBUMIN 2.2 (L) 08/02/2023    Lab Results  Component Value Date   MG 2.0 06/23/2023   MG 2.0 06/22/2023   MG 2.0 06/21/2023   Lab Results  Component Value Date   VD25OH 27.89 (L) 06/26/2023    No results found for: PREALBUMIN    Latest Ref Rng & Units 12/18/2023    9:26 AM 12/15/2023    7:28 AM 10/27/2023    7:08 AM  CBC EXTENDED  WBC 4.0 - 10.5 K/uL  6.7    RBC 4.22 - 5.81 MIL/uL  5.14    Hemoglobin 13.0 - 17.0 g/dL 88.8  88.1  88.3   HCT 39.0 - 52.0 % 36.8  39.9  34.0   Platelets 150 - 400 K/uL  267    NEUT# 1.7 - 7.7 K/uL  3.9    Lymph# 0.7 - 4.0 K/uL  1.6       There is no height or weight on file to calculate BMI.  Orders:  No orders of the defined types were  placed in this encounter.  No orders of the defined types were placed in this encounter.    Procedures: No procedures performed  Clinical Data: No additional findings.  ROS:  All other systems negative, except as noted in the HPI. Review of Systems  Objective: Vital Signs: There were no vitals taken for this visit.  Specialty Comments:  No specialty comments available.  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 (HCC) 12/15/2023   Medication management 08/28/2023   Cellulitis 07/15/2023   Peripheral arterial disease 07/15/2023   Atrial fibrillation, chronic (HCC) 07/15/2023   Dyslipidemia 07/15/2023   BPH (benign prostatic hyperplasia) 07/15/2023   HTN (hypertension) 07/15/2023   DM (diabetes mellitus) (HCC) 07/15/2023   Bacteremia 06/25/2023   Sepsis due to undetermined organism (HCC) 06/21/2023   Cellulitis of right lower leg 06/21/2023   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) 06/20/2023   Streptococcal bacteremia 06/18/2023   Cellulitis of right lower extremity 06/17/2023   Persistent atrial fibrillation (HCC) 09/23/2021   Hypercoagulable state due to persistent atrial fibrillation (HCC) 09/23/2021   Callus 10/25/2019   Corns and callosities 10/25/2019   Hav (hallux abducto valgus), unspecified laterality 10/25/2019   Altered awareness, transient 06/23/2017   MALLET FINGER, RIGHT RING FINGER 10/19/2008   Past Medical History:  Diagnosis Date   A-fib (HCC)    B12 deficiency    Diverticulosis    Fatigue    History of colonic polyps    Hyperlipemia    Hypertension    Peripheral vascular disease    Pernicious anemia    Prostate cancer (HCC)    Syncope    Syncope     Family History  Problem Relation Age of Onset   Stroke Neg Hx    Transient ischemic attack Neg Hx     Past Surgical History:  Procedure Laterality Date   ABDOMINAL  AORTOGRAM N/A 07/20/2023   Procedure: ABDOMINAL AORTOGRAM;  Surgeon: Serene Gaile ORN, MD;  Location: [facility] IN[facility] LAB;  Service: Cardiovascular;  Laterality: N/A;   ABDOMINAL AORTOGRAM W/LOWER EXTREMITY N/A 05/17/2023   Procedure: ABDOMINAL AORTOGRAM W/LOWER EXTREMITY;  Surgeon: Pearline Norman RAMAN, MD;  Location: [facility] INVASIVE CV LAB;  Service: Cardiovascular;  Laterality: N/A;   ABDOMINAL  AORTOGRAM W/LOWER EXTREMITY N/A 10/27/2023   Procedure: ABDOMINAL AORTOGRAM W/LOWER EXTREMITY;  Surgeon: Lanis Fonda BRAVO, MD;  Location: [facility] INVASIVE CV LAB;  Service: Cardiovascular;  Laterality: N/A;   APPENDECTOMY     APPLICATION OF WOUND VAC Bilateral 12/15/2023   Procedure: APPLICATION, WOUND VAC;  Surgeon: Harden Jerona GAILS, MD;  Location: [facility] OR;  Service: Orthopedics;  Laterality: Bilateral;   CARDIOVERSION N/A 10/07/2021   Procedure: CARDIOVERSION;  Surgeon: Sheena Pugh, DO;  Location: [facility];  Service: Cardiovascular;  Laterality: N/A;   INCISION AND DRAINAGE OF DEEP ABSCESS, CALF Bilateral 12/15/2023   Procedure: BILATERAL LEG INCISION AND DRAINAGE OF DEEP ABSCESS;  Surgeon: Harden Jerona GAILS, MD;  Location: [facility] OR;  Service: Orthopedics;  Laterality: Bilateral;   IR ANGIO INTRA EXTRACRAN SEL COM CAROTID INNOMINATE BILAT MOD SED  09/10/2017   IR ANGIO VERTEBRAL SEL SUBCLAVIAN INNOMINATE BILAT MOD SED  09/10/2017   LOWER EXTREMITY ANGIOGRAPHY N/A 07/20/2023   Procedure: Lower Extremity Angiography;  Surgeon: Serene Gaile ORN, MD;  Location: [facility];  Service: Cardiovascular;  Laterality: N/A;   LOWER EXTREMITY INTERVENTION Left 07/20/2023   Procedure: LOWER EXTREMITY INTERVENTION;  Surgeon: Serene Gaile ORN, MD;  Location: [facility];  Service: Cardiovascular;  Laterality: Left;   PERIPHERAL INTRAVASCULAR LITHOTRIPSY Right 05/17/2023   Procedure: PERIPHERAL INTRAVASCULAR LITHOTRIPSY;  Surgeon: Pearline Norman RAMAN, MD;  Location: [facility] INVASIVE CV LAB;  Service: Cardiovascular;  Laterality:  Right;  SFA-POP   PERIPHERAL INTRAVASCULAR LITHOTRIPSY Left 07/20/2023   Procedure: PERIPHERAL INTRAVASCULAR LITHOTRIPSY;  Surgeon: Serene Gaile ORN, MD;  Location: [facility];  Service: Cardiovascular;  Laterality: Left;   PERIPHERAL VASCULAR BALLOON ANGIOPLASTY Right 05/17/2023   Procedure: PERIPHERAL VASCULAR BALLOON ANGIOPLASTY;  Surgeon: Pearline Norman RAMAN, MD;  Location: [facility];  Service: Cardiovascular;  Laterality: Right;  SFA-POP   TONSILLECTOMY     Social History   Occupational History   Not on file  Tobacco Use   Smoking status: Never   Smokeless tobacco: Never   Tobacco comments:    Never smoke 09/23/21  Vaping Use   Vaping status: Never Used  Substance and Sexual Activity   Alcohol  use: Yes    Alcohol /week: 2.0 standard drinks of alcohol     Types: 1 Cans of beer, 1 Shots of liquor per week    Comment: occasionally   Drug use: No   Sexual activity: Not on file       "

## 2024-03-21 ENCOUNTER — Ambulatory Visit: Admitting: Physician Assistant

## 2024-03-21 ENCOUNTER — Encounter: Payer: Self-pay | Admitting: Physician Assistant

## 2024-03-21 DIAGNOSIS — L97521 Non-pressure chronic ulcer of other part of left foot limited to breakdown of skin: Secondary | ICD-10-CM | POA: Diagnosis not present

## 2024-03-21 DIAGNOSIS — I890 Lymphedema, not elsewhere classified: Secondary | ICD-10-CM | POA: Diagnosis not present

## 2024-03-21 DIAGNOSIS — I87331 Chronic venous hypertension (idiopathic) with ulcer and inflammation of right lower extremity: Secondary | ICD-10-CM

## 2024-03-21 DIAGNOSIS — I87319 Chronic venous hypertension (idiopathic) with ulcer of unspecified lower extremity: Secondary | ICD-10-CM | POA: Diagnosis not present

## 2024-03-21 MED ORDER — OXYCODONE HCL 5 MG PO CAPS: 5.0000 mg | ORAL_CAPSULE | Freq: Three times a day (TID) | ORAL | 0 refills | Status: AC | PRN

## 2024-03-21 NOTE — Addendum Note (Signed)
 Addended by: GEROME MAURILIO HERO on: 03/21/2024 02:07 PM   Modules accepted: Orders

## 2024-03-21 NOTE — Progress Notes (Addendum)
 "  Office Visit Note   Patient: Logan French           Date of Birth: 1934/10/08           MRN: [id number] Visit Date: 03/21/2024              Requested by: Yolande Toribio MATSU, MD [address] [place],  [place] [address] PCP: Yolande Toribio MATSU, MD  Chief Complaint  Patient presents with   Left Leg - Follow-up   Right Leg - Follow-up      HPI: Logan French is an 89 year old male with chronic bilateral lower extremity ulcers who presents for follow-up of wound healing.              We have had progressive healing with the compression wraps and elevation since he has a hospital bed it has helped with the elevation positioning.  We are waiting until the wound are very superficial and almost healed until we transition to compression socks from wraps.    Assessment & Plan: Visit Diagnoses:  1. Chronic venous hypertension (idiopathic) with ulcer and inflammation of right lower extremity (HCC)   2. Non-pressure chronic ulcer of other part of left foot limited to breakdown of skin (HCC)   3. Lymphedema     Plan: Venous ulcers B LE.  The wounds are getting smaller in size and he has good skin lines with decreased edema.  No cellulitis.  The posterior right wound was bleeding.  Silver nitrate was used to stop the bleeding.   B LE compression wraps were reapplied today.  Pending healing our plan is to place him in compression socks for life.  He takes pain medication PRN sometimes up to 2 a day depending on the leg wounds and pain.  I refilled his Oxy IR 5 mg 1 q 12 PRN for pain # 30.    Follow-Up Instructions: Return in about 1 week (around 03/28/2024).   Ortho Exam  Patient is alert, oriented, no adenopathy, well-dressed, normal affect, normal respiratory effort. Right LE 0.5 cm x 1 cm posterior blood blister with proximal open area  , 3 cm x 2 cm medial malleolus.  Moderate edema in the right LE > left with shiny skin and clear weeping fluid drainage.  No cellulitis.              The left proximal wound 4 cm x 3.5 cm, middle 2 cm x 1.5 cm, and distal 2 cm x 3 cm.  I used silver nitrate on the middle and distal wound for hyper granulation.      Imaging:              Labs: Lab Results  Component Value Date   HGBA1C 6.2 (H) 10/06/2023   HGBA1C 6.4 (H) 06/17/2023   CRP 26.9 (H) 06/23/2023   CRP 27.5 (H) 06/22/2023   CRP 27.6 (H) 06/21/2023   LABURIC 5.0 06/22/2023   LABURIC 5.3 06/21/2023   REPTSTATUS 12/20/2023 FINAL 12/15/2023   GRAMSTAIN  12/15/2023    RARE WBC PRESENT, PREDOMINANTLY PMN ABUNDANT GRAM POSITIVE COCCI RARE GRAM NEGATIVE RODS    CULT  12/15/2023    MODERATE PROTEUS MIRABILIS MODERATE PROVIDENCIA STUARTII FEW KLEBSIELLA OXYTOCA FEW PSEUDOMONAS AERUGINOSA MODERATE STAPHYLOCOCCUS AUREUS SUSCEPTIBILITIES PERFORMED ON PREVIOUS CULTURE WITHIN THE LAST 5 DAYS. NO ANAEROBES ISOLATED Performed at [facility], [address] [place], [place] [address]    LABORGA PROTEUS MIRABILIS 12/15/2023   LABORGA  KLEBSIELLA OXYTOCA 12/15/2023   LABORGA PSEUDOMONAS AERUGINOSA 12/15/2023   LABORGA PROVIDENCIA STUARTII 12/15/2023     Lab Results  Component Value Date   ALBUMIN 2.7 (L) 12/15/2023   ALBUMIN 2.0 (L) 08/04/2023   ALBUMIN 2.2 (L) 08/02/2023    Lab Results  Component Value Date   MG 2.0 06/23/2023   MG 2.0 06/22/2023   MG 2.0 06/21/2023   Lab Results  Component Value Date   VD25OH 27.89 (L) 06/26/2023    No results found for: PREALBUMIN    Latest Ref Rng & Units 12/18/2023    9:26 AM 12/15/2023    7:28 AM 10/27/2023    7:08 AM  CBC EXTENDED  WBC 4.0 - 10.5 K/uL  6.7    RBC 4.22 - 5.81 MIL/uL  5.14    Hemoglobin 13.0 - 17.0 g/dL 88.8  88.1  88.3   HCT 39.0 - 52.0 % 36.8  39.9  34.0   Platelets 150 - 400 K/uL  267    NEUT# 1.7 - 7.7 K/uL  3.9    Lymph# 0.7 - 4.0 K/uL  1.6       There is no height or weight on file to calculate BMI.  Orders:  No orders of the defined types were placed in this  encounter.  No orders of the defined types were placed in this encounter.    Procedures: No procedures performed  Clinical Data: No additional findings.  ROS:  All other systems negative, except as noted in the HPI. Review of Systems  Objective: Vital Signs: There were no vitals taken for this visit.  Specialty Comments:  No specialty comments available.  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 (HCC) 12/15/2023   Medication management 08/28/2023   Cellulitis 07/15/2023   Peripheral arterial disease 07/15/2023   Atrial fibrillation, chronic (HCC) 07/15/2023   Dyslipidemia 07/15/2023   BPH (benign prostatic hyperplasia) 07/15/2023   HTN (hypertension) 07/15/2023   DM (diabetes mellitus) (HCC) 07/15/2023   Bacteremia 06/25/2023   Sepsis due to undetermined organism (HCC) 06/21/2023   Cellulitis of right lower leg 06/21/2023   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) 06/20/2023   Streptococcal bacteremia 06/18/2023   Cellulitis of right lower extremity 06/17/2023   Persistent atrial fibrillation (HCC) 09/23/2021   Hypercoagulable state due to persistent atrial fibrillation (HCC) 09/23/2021   Callus 10/25/2019   Corns and callosities 10/25/2019   Hav (hallux abducto valgus), unspecified laterality 10/25/2019   Altered awareness, transient 06/23/2017   MALLET FINGER, RIGHT RING FINGER 10/19/2008   Past Medical History:  Diagnosis Date   A-fib (HCC)    B12 deficiency    Diverticulosis    Fatigue    History of colonic polyps    Hyperlipemia    Hypertension    Peripheral vascular disease    Pernicious anemia    Prostate cancer (HCC)    Syncope    Syncope     Family History  Problem Relation Age of Onset   Stroke Neg Hx    Transient ischemic attack Neg Hx     Past Surgical History:  Procedure Laterality Date   ABDOMINAL AORTOGRAM N/A  07/20/2023   Procedure: ABDOMINAL AORTOGRAM;  Surgeon: Serene Gaile ORN, MD;  Location: [facility];  Service: Cardiovascular;  Laterality: N/A;   ABDOMINAL AORTOGRAM W/LOWER EXTREMITY N/A 05/17/2023   Procedure: ABDOMINAL AORTOGRAM W/LOWER  EXTREMITY;  Surgeon: Pearline Norman RAMAN, MD;  Location: [facility] INVASIVE CV LAB;  Service: Cardiovascular;  Laterality: N/A;   ABDOMINAL AORTOGRAM W/LOWER EXTREMITY N/A 10/27/2023   Procedure: ABDOMINAL AORTOGRAM W/LOWER EXTREMITY;  Surgeon: Lanis Fonda BRAVO, MD;  Location: [facility] INVASIVE CV LAB;  Service: Cardiovascular;  Laterality: N/A;   APPENDECTOMY     APPLICATION OF WOUND VAC Bilateral 12/15/2023   Procedure: APPLICATION, WOUND VAC;  Surgeon: Harden Jerona GAILS, MD;  Location: [facility];  Service: Orthopedics;  Laterality: Bilateral;   CARDIOVERSION N/A 10/07/2021   Procedure: CARDIOVERSION;  Surgeon: Sheena Pugh, DO;  Location: [facility];  Service: Cardiovascular;  Laterality: N/A;   INCISION AND DRAINAGE OF DEEP ABSCESS, CALF Bilateral 12/15/2023   Procedure: BILATERAL LEG INCISION AND DRAINAGE OF DEEP ABSCESS;  Surgeon: Harden Jerona GAILS, MD;  Location: [facility];  Service: Orthopedics;  Laterality: Bilateral;   IR ANGIO INTRA EXTRACRAN SEL COM CAROTID INNOMINATE BILAT MOD SED  09/10/2017   IR ANGIO VERTEBRAL SEL SUBCLAVIAN INNOMINATE BILAT MOD SED  09/10/2017   LOWER EXTREMITY ANGIOGRAPHY N/A 07/20/2023   Procedure: Lower Extremity Angiography;  Surgeon: Serene Gaile ORN, MD;  Location: [facility];  Service: Cardiovascular;  Laterality: N/A;   LOWER EXTREMITY INTERVENTION Left 07/20/2023   Procedure: LOWER EXTREMITY INTERVENTION;  Surgeon: Serene Gaile ORN, MD;  Location: [facility];  Service: Cardiovascular;  Laterality: Left;   PERIPHERAL INTRAVASCULAR LITHOTRIPSY Right 05/17/2023   Procedure: PERIPHERAL INTRAVASCULAR LITHOTRIPSY;  Surgeon: Pearline Norman RAMAN, MD;  Location: [facility] INVASIVE CV LAB;  Service: Cardiovascular;  Laterality: Right;  SFA-POP    PERIPHERAL INTRAVASCULAR LITHOTRIPSY Left 07/20/2023   Procedure: PERIPHERAL INTRAVASCULAR LITHOTRIPSY;  Surgeon: Serene Gaile ORN, MD;  Location: [facility];  Service: Cardiovascular;  Laterality: Left;   PERIPHERAL VASCULAR BALLOON ANGIOPLASTY Right 05/17/2023   Procedure: PERIPHERAL VASCULAR BALLOON ANGIOPLASTY;  Surgeon: Pearline Norman RAMAN, MD;  Location: [facility];  Service: Cardiovascular;  Laterality: Right;  SFA-POP   TONSILLECTOMY     Social History   Occupational History   Not on file  Tobacco Use   Smoking status: Never   Smokeless tobacco: Never   Tobacco comments:    Never smoke 09/23/21  Vaping Use   Vaping status: Never Used  Substance and Sexual Activity   Alcohol  use: Yes    Alcohol /week: 2.0 standard drinks of alcohol     Types: 1 Cans of beer, 1 Shots of liquor per week    Comment: occasionally   Drug use: No   Sexual activity: Not on file       "

## 2024-03-22 ENCOUNTER — Other Ambulatory Visit (HOSPITAL_COMMUNITY): Payer: Self-pay | Admitting: Neurology

## 2024-03-22 MED ORDER — STUDY - LIBREXIA-AF - JNJ-70033093 (MILVEXIAN) 100 MG OR PLACEBO (PI-SETHI): 1.0000 | ORAL_TABLET | Freq: Two times a day (BID) | ORAL | 0 refills | Status: AC

## 2024-03-22 MED ORDER — STUDY - LIBREXIA-AF - APIXABAN 5 MG OR PLACEBO CAPSULE (PI-SETHI): 5.0000 mg | ORAL_CAPSULE | Freq: Two times a day (BID) | ORAL | 0 refills | Status: AC

## 2024-03-28 ENCOUNTER — Encounter: Payer: Self-pay | Admitting: Physician Assistant

## 2024-03-28 ENCOUNTER — Ambulatory Visit: Admitting: Physician Assistant

## 2024-03-28 DIAGNOSIS — I87331 Chronic venous hypertension (idiopathic) with ulcer and inflammation of right lower extremity: Secondary | ICD-10-CM

## 2024-03-28 DIAGNOSIS — L97521 Non-pressure chronic ulcer of other part of left foot limited to breakdown of skin: Secondary | ICD-10-CM

## 2024-03-28 NOTE — Progress Notes (Signed)
 "  Office Visit Note   Patient: Logan French           Date of Birth: 02-28-1935           MRN: [id number] Visit Date: 03/28/2024              Requested by: Yolande Toribio MATSU, MD [address] [place],  [place] [address] PCP: Yolande Toribio MATSU, MD  Chief Complaint  Patient presents with   Right Leg - Wound Check   Left Leg - Wound Check      HPI: Logan French is an 89 year old male with chronic bilateral lower extremity ulcers who presents for follow-up of wound healing.              We have had progressive healing with the compression wraps and elevation since he has a hospital bed it has helped with the elevation positioning.  We are waiting until the wound are very superficial and almost healed until we transition to compression socks from wraps.     Assessment & Plan: Visit Diagnoses: No diagnosis found.  Plan:  Venous ulcers B LE. The wounds are getting smaller in size and he has good skin lines with decreased edema. No cellulitis.  B LE compression wraps were reapplied today. Pending healing our plan is to place him in compression socks for life.    Follow-Up Instructions: Return in about 1 week (around 04/04/2024).   Ortho Exam  Patient is alert, oriented, no adenopathy, well-dressed, normal affect, normal respiratory effort. No apparent edema in bilateral LE.  Wounds are beefy red and 100 % granulation tissue.  The left LE wounds from proximal to distal measure 4 cm x 2.5 cm, 2 x 1.5 cm and 2.5 x 1.5 cm.  The medial malleolus is 1 cm.  The right LE medial malleolus 1 cm x 1.5 cm.  Posterior wound 3 cm x 1 cm.      Imaging: No results found.        Labs: Lab Results  Component Value Date   HGBA1C 6.2 (H) 10/06/2023   HGBA1C 6.4 (H) 06/17/2023   CRP 26.9 (H) 06/23/2023   CRP 27.5 (H) 06/22/2023   CRP 27.6 (H) 06/21/2023   LABURIC 5.0 06/22/2023   LABURIC 5.3 06/21/2023   REPTSTATUS 12/20/2023 FINAL 12/15/2023   GRAMSTAIN  12/15/2023     RARE WBC PRESENT, PREDOMINANTLY PMN ABUNDANT GRAM POSITIVE COCCI RARE GRAM NEGATIVE RODS    CULT  12/15/2023    MODERATE PROTEUS MIRABILIS MODERATE PROVIDENCIA STUARTII FEW KLEBSIELLA OXYTOCA FEW PSEUDOMONAS AERUGINOSA MODERATE STAPHYLOCOCCUS AUREUS SUSCEPTIBILITIES PERFORMED ON PREVIOUS CULTURE WITHIN THE LAST 5 DAYS. NO ANAEROBES ISOLATED Performed at [facility], [address], [place], [place] [address]    LABORGA PROTEUS MIRABILIS 12/15/2023   LABORGA KLEBSIELLA OXYTOCA 12/15/2023   LABORGA PSEUDOMONAS AERUGINOSA 12/15/2023   LABORGA PROVIDENCIA STUARTII 12/15/2023     Lab Results  Component Value Date   ALBUMIN 2.7 (L) 12/15/2023   ALBUMIN 2.0 (L) 08/04/2023   ALBUMIN 2.2 (L) 08/02/2023    Lab Results  Component Value Date   MG 2.0 06/23/2023   MG 2.0 06/22/2023   MG 2.0 06/21/2023   Lab Results  Component Value Date   VD25OH 27.89 (L) 06/26/2023    No results found for: PREALBUMIN    Latest Ref Rng & Units 12/18/2023    9:26 AM 12/15/2023    7:28 AM 10/27/2023    7:08 AM  CBC EXTENDED  WBC 4.0 - 10.5 K/uL  6.7    RBC 4.22 - 5.81 MIL/uL  5.14    Hemoglobin 13.0 - 17.0 g/dL 88.8  88.1  88.3   HCT 39.0 - 52.0 % 36.8  39.9  34.0   Platelets 150 - 400 K/uL  267    NEUT# 1.7 - 7.7 K/uL  3.9    Lymph# 0.7 - 4.0 K/uL  1.6       There is no height or weight on file to calculate BMI.  Orders:  No orders of the defined types were placed in this encounter.  No orders of the defined types were placed in this encounter.    Procedures: No procedures performed  Clinical Data: No additional findings.  ROS:  All other systems negative, except as noted in the HPI. Review of Systems  Objective: Vital Signs: There were no vitals taken for this visit.  Specialty Comments:  No specialty comments available.  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 (HCC)  12/15/2023   Medication management 08/28/2023   Cellulitis 07/15/2023   Peripheral arterial disease 07/15/2023   Atrial fibrillation, chronic (HCC) 07/15/2023   Dyslipidemia 07/15/2023   BPH (benign prostatic hyperplasia) 07/15/2023   HTN (hypertension) 07/15/2023   DM (diabetes mellitus) (HCC) 07/15/2023   Bacteremia 06/25/2023   Sepsis due to undetermined organism (HCC) 06/21/2023   Cellulitis of right lower leg 06/21/2023   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) 06/20/2023   Streptococcal bacteremia 06/18/2023   Cellulitis of right lower extremity 06/17/2023   Persistent atrial fibrillation (HCC) 09/23/2021   Hypercoagulable state due to persistent atrial fibrillation (HCC) 09/23/2021   Callus 10/25/2019   Corns and callosities 10/25/2019   Hav (hallux abducto valgus), unspecified laterality 10/25/2019   Altered awareness, transient 06/23/2017   MALLET FINGER, RIGHT RING FINGER 10/19/2008   Past Medical History:  Diagnosis Date   A-fib (HCC)    B12 deficiency    Diverticulosis    Fatigue    History of colonic polyps    Hyperlipemia    Hypertension    Peripheral vascular disease    Pernicious anemia    Prostate cancer (HCC)    Syncope    Syncope     Family History  Problem Relation Age of Onset   Stroke Neg Hx    Transient ischemic attack Neg Hx     Past Surgical History:  Procedure Laterality Date   ABDOMINAL AORTOGRAM N/A 07/20/2023   Procedure: ABDOMINAL AORTOGRAM;  Surgeon: Serene Gaile ORN, MD;  Location: [facility] LAB;  Service: Cardiovascular;  Laterality: N/A;   ABDOMINAL AORTOGRAM W/LOWER EXTREMITY N/A 05/17/2023   Procedure: ABDOMINAL AORTOGRAM W/LOWER EXTREMITY;  Surgeon: Pearline Norman RAMAN, MD;  Location: [facility] INVASIVE CV LAB;  Service: Cardiovascular;  Laterality: N/A;   ABDOMINAL AORTOGRAM W/LOWER EXTREMITY N/A 10/27/2023   Procedure: ABDOMINAL AORTOGRAM W/LOWER EXTREMITY;  Surgeon: Lanis Fonda BRAVO, MD;   Location: [facility] INVASIVE CV LAB;  Service: Cardiovascular;  Laterality: N/A;   APPENDECTOMY     APPLICATION OF WOUND VAC Bilateral 12/15/2023   Procedure: APPLICATION, WOUND VAC;  Surgeon: Harden Jerona GAILS, MD;  Location: [facility] OR;  Service: Orthopedics;  Laterality: Bilateral;   CARDIOVERSION N/A 10/07/2021   Procedure: CARDIOVERSION;  Surgeon: Sheena Pugh, DO;  Location: [facility];  Service: Cardiovascular;  Laterality: N/A;   INCISION AND DRAINAGE  OF DEEP ABSCESS, CALF Bilateral 12/15/2023   Procedure: BILATERAL LEG INCISION AND DRAINAGE OF DEEP ABSCESS;  Surgeon: Harden Jerona GAILS, MD;  Location: [facility] OR;  Service: Orthopedics;  Laterality: Bilateral;   IR ANGIO INTRA EXTRACRAN SEL COM CAROTID INNOMINATE BILAT MOD SED  09/10/2017   IR ANGIO VERTEBRAL SEL SUBCLAVIAN INNOMINATE BILAT MOD SED  09/10/2017   LOWER EXTREMITY ANGIOGRAPHY N/A 07/20/2023   Procedure: Lower Extremity Angiography;  Surgeon: Serene Gaile ORN, MD;  Location: [facility];  Service: Cardiovascular;  Laterality: N/A;   LOWER EXTREMITY INTERVENTION Left 07/20/2023   Procedure: LOWER EXTREMITY INTERVENTION;  Surgeon: Serene Gaile ORN, MD;  Location: [facility];  Service: Cardiovascular;  Laterality: Left;   PERIPHERAL INTRAVASCULAR LITHOTRIPSY Right 05/17/2023   Procedure: PERIPHERAL INTRAVASCULAR LITHOTRIPSY;  Surgeon: Pearline Norman RAMAN, MD;  Location: [facility] INVASIVE CV LAB;  Service: Cardiovascular;  Laterality: Right;  SFA-POP   PERIPHERAL INTRAVASCULAR LITHOTRIPSY Left 07/20/2023   Procedure: PERIPHERAL INTRAVASCULAR LITHOTRIPSY;  Surgeon: Serene Gaile ORN, MD;  Location: [facility];  Service: Cardiovascular;  Laterality: Left;   PERIPHERAL VASCULAR BALLOON ANGIOPLASTY Right 05/17/2023   Procedure: PERIPHERAL VASCULAR BALLOON ANGIOPLASTY;  Surgeon: Pearline Norman RAMAN, MD;  Location: [facility];  Service: Cardiovascular;  Laterality: Right;  SFA-POP   TONSILLECTOMY     Social History   Occupational History    Not on file  Tobacco Use   Smoking status: Never   Smokeless tobacco: Never   Tobacco comments:    Never smoke 09/23/21  Vaping Use   Vaping status: Never Used  Substance and Sexual Activity   Alcohol  use: Yes    Alcohol /week: 2.0 standard drinks of alcohol     Types: 1 Cans of beer, 1 Shots of liquor per week    Comment: occasionally   Drug use: No   Sexual activity: Not on file       "

## 2024-04-04 ENCOUNTER — Encounter: Payer: Self-pay | Admitting: Physician Assistant

## 2024-04-04 ENCOUNTER — Ambulatory Visit: Admitting: Physician Assistant

## 2024-04-04 DIAGNOSIS — L97521 Non-pressure chronic ulcer of other part of left foot limited to breakdown of skin: Secondary | ICD-10-CM

## 2024-04-04 DIAGNOSIS — I890 Lymphedema, not elsewhere classified: Secondary | ICD-10-CM

## 2024-04-04 DIAGNOSIS — I87319 Chronic venous hypertension (idiopathic) with ulcer of unspecified lower extremity: Secondary | ICD-10-CM | POA: Diagnosis not present

## 2024-04-04 DIAGNOSIS — I87331 Chronic venous hypertension (idiopathic) with ulcer and inflammation of right lower extremity: Secondary | ICD-10-CM | POA: Diagnosis not present

## 2024-04-04 NOTE — Progress Notes (Signed)
 "  Office Visit Note   Patient: Logan French           Date of Birth: 07-22-1934           MRN: [id number] Visit Date: 04/04/2024              Requested by: Yolande Toribio MATSU, MD [address] [place],  [place] [address] PCP: Yolande Toribio MATSU, MD  Chief Complaint  Patient presents with   Left Leg - Follow-up   Right Leg - Follow-up      HPI: Maleik Vanderzee is an 89 year old male with chronic bilateral lower extremity ulcers who presents for follow-up of wound healing.              We have had progressive healing with the compression wraps and elevation since he has a hospital bed it has helped with the elevation positioning.  We are waiting until the wound are very superficial and almost healed until we transition to compression socks from wraps.    Has has made good recovery now that he has had a bed that elevates his legs with combination of the compression wraps.  His son asked what was th most important issue for healing in regards to nutrition.  I said that he should intake protein to assist with wound healing.    Assessment & Plan: Visit Diagnoses:  1. Chronic venous hypertension (idiopathic) with ulcer and inflammation of right lower extremity (HCC)   2. Non-pressure chronic ulcer of other part of left foot limited to breakdown of skin (HCC)   3. Lymphedema   4. Venous ulcer of lower extremity due to chronic peripheral venous hypertension (HCC)     Plan: Bilateral LE venous wounds The wounds are healing and getting smaller in size.  He is doing well with elevation and his family is helping take care of his skin in between dressing changes.   Compression wraps multi layer where reapplied today.  As soon as the wound are healed he will go into the Vive compression socks for life time compression and edema control.    Follow-Up Instructions: Return in about 1 week (around 04/11/2024).   Ortho Exam  Patient is alert, oriented, no adenopathy, well-dressed, normal  affect, normal respiratory effort. The right LE ankle and posterior wounds are almost healed.   Good skin lines and no cellulitis.  The post erior wound has 100 % granulation tissue.    The left LE had a small 2 cm x 3 mm blood blister next to the proximal wound on the posterior .  The bloos blister was de roofed.  The 3 posterior wounds are healing well without cellulitis.       Imaging:              Labs: Lab Results  Component Value Date   HGBA1C 6.2 (H) 10/06/2023   HGBA1C 6.4 (H) 06/17/2023   CRP 26.9 (H) 06/23/2023   CRP 27.5 (H) 06/22/2023   CRP 27.6 (H) 06/21/2023   LABURIC 5.0 06/22/2023   LABURIC 5.3 06/21/2023   REPTSTATUS 12/20/2023 FINAL 12/15/2023   GRAMSTAIN  12/15/2023    RARE WBC PRESENT, PREDOMINANTLY PMN ABUNDANT GRAM POSITIVE COCCI RARE GRAM NEGATIVE RODS    CULT  12/15/2023    MODERATE PROTEUS MIRABILIS MODERATE PROVIDENCIA STUARTII FEW KLEBSIELLA OXYTOCA FEW PSEUDOMONAS AERUGINOSA MODERATE STAPHYLOCOCCUS AUREUS SUSCEPTIBILITIES PERFORMED ON PREVIOUS CULTURE WITHIN THE LAST 5 DAYS. NO ANAEROBES ISOLATED Performed at [facility],  [address], [place], [place] [address]    LABORGA PROTEUS MIRABILIS 12/15/2023   LABORGA KLEBSIELLA OXYTOCA 12/15/2023   LABORGA PSEUDOMONAS AERUGINOSA 12/15/2023   LABORGA PROVIDENCIA STUARTII 12/15/2023     Lab Results  Component Value Date   ALBUMIN 2.7 (L) 12/15/2023   ALBUMIN 2.0 (L) 08/04/2023   ALBUMIN 2.2 (L) 08/02/2023    Lab Results  Component Value Date   MG 2.0 06/23/2023   MG 2.0 06/22/2023   MG 2.0 06/21/2023   Lab Results  Component Value Date   VD25OH 27.89 (L) 06/26/2023    No results found for: PREALBUMIN    Latest Ref Rng & Units 12/18/2023    9:26 AM 12/15/2023    7:28 AM 10/27/2023    7:08 AM  CBC EXTENDED  WBC 4.0 - 10.5 K/uL  6.7    RBC 4.22 - 5.81 MIL/uL  5.14    Hemoglobin 13.0 - 17.0 g/dL 88.8  88.1  88.3   HCT 39.0 - 52.0 % 36.8  39.9  34.0   Platelets  150 - 400 K/uL  267    NEUT# 1.7 - 7.7 K/uL  3.9    Lymph# 0.7 - 4.0 K/uL  1.6       There is no height or weight on file to calculate BMI.  Orders:  No orders of the defined types were placed in this encounter.  No orders of the defined types were placed in this encounter.    Procedures: No procedures performed  Clinical Data: No additional findings.  ROS:  All other systems negative, except as noted in the HPI. Review of Systems  Objective: Vital Signs: There were no vitals taken for this visit.  Specialty Comments:  No specialty comments available.  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 (HCC) 12/15/2023   Medication management 08/28/2023   Cellulitis 07/15/2023   Peripheral arterial disease 07/15/2023   Atrial fibrillation, chronic (HCC) 07/15/2023   Dyslipidemia 07/15/2023   BPH (benign prostatic hyperplasia) 07/15/2023   HTN (hypertension) 07/15/2023   DM (diabetes mellitus) (HCC) 07/15/2023   Bacteremia 06/25/2023   Sepsis due to undetermined organism (HCC) 06/21/2023   Cellulitis of right lower leg 06/21/2023   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) 06/20/2023   Streptococcal bacteremia 06/18/2023   Cellulitis of right lower extremity 06/17/2023   Persistent atrial fibrillation (HCC) 09/23/2021   Hypercoagulable state due to persistent atrial fibrillation (HCC) 09/23/2021   Callus 10/25/2019   Corns and callosities 10/25/2019   Hav (hallux abducto valgus), unspecified laterality 10/25/2019   Altered awareness, transient 06/23/2017   MALLET FINGER, RIGHT RING FINGER 10/19/2008   Past Medical History:  Diagnosis Date   A-fib (HCC)    B12 deficiency    Diverticulosis    Fatigue    History of colonic polyps    Hyperlipemia    Hypertension    Peripheral vascular disease    Pernicious anemia    Prostate cancer (HCC)     Syncope    Syncope     Family History  Problem Relation Age of Onset   Stroke Neg Hx    Transient ischemic attack Neg Hx     Past Surgical History:  Procedure Laterality Date   ABDOMINAL AORTOGRAM N/A 07/20/2023   Procedure: ABDOMINAL AORTOGRAM;  Surgeon: Serene Gaile ORN, MD;  Location: [facility];  Service: Cardiovascular;  Laterality: N/A;   ABDOMINAL AORTOGRAM W/LOWER EXTREMITY N/A 05/17/2023   Procedure: ABDOMINAL AORTOGRAM W/LOWER EXTREMITY;  Surgeon: Pearline Norman RAMAN, MD;  Location: [facility]VASIVE CV LAB;  Service: Cardiovascular;  Laterality: N/A;   ABDOMINAL AORTOGRAM W/LOWER EXTREMITY N/A 10/27/2023   Procedure: ABDOMINAL AORTOGRAM W/LOWER EXTREMITY;  Surgeon: Lanis Fonda BRAVO, MD;  Location: [facility] INVASIVE CV LAB;  Service: Cardiovascular;  Laterality: N/A;   APPENDECTOMY     APPLICATION OF WOUND VAC Bilateral 12/15/2023   Procedure: APPLICATION, WOUND VAC;  Surgeon: Harden Jerona GAILS, MD;  Location: [facility];  Service: Orthopedics;  Laterality: Bilateral;   CARDIOVERSION N/A 10/07/2021   Procedure: CARDIOVERSION;  Surgeon: Sheena Pugh, DO;  Location: [facility];  Service: Cardiovascular;  Laterality: N/A;   INCISION AND DRAINAGE OF DEEP ABSCESS, CALF Bilateral 12/15/2023   Procedure: BILATERAL LEG INCISION AND DRAINAGE OF DEEP ABSCESS;  Surgeon: Harden Jerona GAILS, MD;  Location: [facility];  Service: Orthopedics;  Laterality: Bilateral;   IR ANGIO INTRA EXTRACRAN SEL COM CAROTID INNOMINATE BILAT MOD SED  09/10/2017   IR ANGIO VERTEBRAL SEL SUBCLAVIAN INNOMINATE BILAT MOD SED  09/10/2017   LOWER EXTREMITY ANGIOGRAPHY N/A 07/20/2023   Procedure: Lower Extremity Angiography;  Surgeon: Serene Gaile ORN, MD;  Location: [facility];  Service: Cardiovascular;  Laterality: N/A;   LOWER EXTREMITY INTERVENTION Left 07/20/2023   Procedure: LOWER EXTREMITY INTERVENTION;  Surgeon: Serene Gaile ORN, MD;  Location: [facility];  Service: Cardiovascular;  Laterality: Left;   PERIPHERAL  INTRAVASCULAR LITHOTRIPSY Right 05/17/2023   Procedure: PERIPHERAL INTRAVASCULAR LITHOTRIPSY;  Surgeon: Pearline Norman RAMAN, MD;  Location: [facility] INVASIVE CV LAB;  Service: Cardiovascular;  Laterality: Right;  SFA-POP   PERIPHERAL INTRAVASCULAR LITHOTRIPSY Left 07/20/2023   Procedure: PERIPHERAL INTRAVASCULAR LITHOTRIPSY;  Surgeon: Serene Gaile ORN, MD;  Location: [facility];  Service: Cardiovascular;  Laterality: Left;   PERIPHERAL VASCULAR BALLOON ANGIOPLASTY Right 05/17/2023   Procedure: PERIPHERAL VASCULAR BALLOON ANGIOPLASTY;  Surgeon: Pearline Norman RAMAN, MD;  Location: [facility];  Service: Cardiovascular;  Laterality: Right;  SFA-POP   TONSILLECTOMY     Social History   Occupational History   Not on file  Tobacco Use   Smoking status: Never   Smokeless tobacco: Never   Tobacco comments:    Never smoke 09/23/21  Vaping Use   Vaping status: Never Used  Substance and Sexual Activity   Alcohol  use: Yes    Alcohol /week: 2.0 standard drinks of alcohol     Types: 1 Cans of beer, 1 Shots of liquor per week    Comment: occasionally   Drug use: No   Sexual activity: Not on file       "

## 2024-04-11 ENCOUNTER — Ambulatory Visit: Admitting: Physician Assistant

## 2024-04-14 ENCOUNTER — Ambulatory Visit: Admitting: Physician Assistant

## 2024-04-14 DIAGNOSIS — I87331 Chronic venous hypertension (idiopathic) with ulcer and inflammation of right lower extremity: Secondary | ICD-10-CM

## 2024-04-14 DIAGNOSIS — I890 Lymphedema, not elsewhere classified: Secondary | ICD-10-CM | POA: Diagnosis not present

## 2024-04-14 DIAGNOSIS — L97521 Non-pressure chronic ulcer of other part of left foot limited to breakdown of skin: Secondary | ICD-10-CM | POA: Diagnosis not present

## 2024-04-17 ENCOUNTER — Encounter: Payer: Self-pay | Admitting: Physician Assistant

## 2024-04-21 ENCOUNTER — Ambulatory Visit (HOSPITAL_COMMUNITY)

## 2024-04-21 ENCOUNTER — Ambulatory Visit

## 2024-04-25 ENCOUNTER — Ambulatory Visit: Admitting: Physician Assistant

## 2024-05-12 ENCOUNTER — Ambulatory Visit (HOSPITAL_COMMUNITY)

## 2024-05-12 ENCOUNTER — Ambulatory Visit

## 2024-10-04 ENCOUNTER — Ambulatory Visit: Admitting: Neurology
